# Patient Record
Sex: Male | Born: 1945 | Race: White | Hispanic: No | Marital: Single | Smoking: Former smoker
Health system: Southern US, Community
[De-identification: ages and names within clinical notes are randomized; demographics above are authoritative.]

## PROBLEM LIST (undated history)

## (undated) DIAGNOSIS — E782 Mixed hyperlipidemia: Secondary | ICD-10-CM

## (undated) DIAGNOSIS — I4891 Unspecified atrial fibrillation: Secondary | ICD-10-CM

## (undated) DIAGNOSIS — E669 Obesity, unspecified: Secondary | ICD-10-CM

## (undated) DIAGNOSIS — N400 Enlarged prostate without lower urinary tract symptoms: Secondary | ICD-10-CM

## (undated) DIAGNOSIS — M1990 Unspecified osteoarthritis, unspecified site: Secondary | ICD-10-CM

## (undated) DIAGNOSIS — I712 Thoracic aortic aneurysm, without rupture, unspecified: Secondary | ICD-10-CM

## (undated) DIAGNOSIS — I10 Essential (primary) hypertension: Secondary | ICD-10-CM

## (undated) DIAGNOSIS — E1169 Type 2 diabetes mellitus with other specified complication: Secondary | ICD-10-CM

## (undated) DIAGNOSIS — G4700 Insomnia, unspecified: Secondary | ICD-10-CM

## (undated) HISTORY — PX: TONSILLECTOMY: SUR1361

## (undated) HISTORY — DX: Essential (primary) hypertension: I10

## (undated) HISTORY — DX: Type 2 diabetes mellitus with other specified complication: E11.69

## (undated) HISTORY — DX: Insomnia, unspecified: G47.00

## (undated) HISTORY — DX: Benign prostatic hyperplasia without lower urinary tract symptoms: N40.0

## (undated) HISTORY — DX: Obesity, unspecified: E66.9

## (undated) HISTORY — DX: Mixed hyperlipidemia: E78.2

## (undated) HISTORY — PX: OTHER SURGICAL HISTORY: SHX169

## (undated) HISTORY — PX: CYST EXCISION: SHX5701

## (undated) HISTORY — DX: Unspecified osteoarthritis, unspecified site: M19.90

---

## 1998-06-15 HISTORY — PX: KNEE SURGERY: SHX244

## 1999-09-24 ENCOUNTER — Encounter: Payer: Self-pay | Admitting: Emergency Medicine

## 1999-09-24 ENCOUNTER — Inpatient Hospital Stay (HOSPITAL_COMMUNITY): Admission: EM | Admit: 1999-09-24 | Discharge: 1999-09-25 | Payer: Self-pay | Admitting: Emergency Medicine

## 2009-05-28 ENCOUNTER — Ambulatory Visit: Payer: Self-pay | Admitting: Internal Medicine

## 2009-05-28 DIAGNOSIS — R030 Elevated blood-pressure reading, without diagnosis of hypertension: Secondary | ICD-10-CM | POA: Insufficient documentation

## 2009-05-28 DIAGNOSIS — M1990 Unspecified osteoarthritis, unspecified site: Secondary | ICD-10-CM | POA: Insufficient documentation

## 2009-05-28 DIAGNOSIS — G4730 Sleep apnea, unspecified: Secondary | ICD-10-CM

## 2009-05-28 DIAGNOSIS — N529 Male erectile dysfunction, unspecified: Secondary | ICD-10-CM | POA: Insufficient documentation

## 2009-05-28 DIAGNOSIS — G4700 Insomnia, unspecified: Secondary | ICD-10-CM | POA: Insufficient documentation

## 2009-05-28 DIAGNOSIS — D126 Benign neoplasm of colon, unspecified: Secondary | ICD-10-CM | POA: Insufficient documentation

## 2009-05-28 LAB — CONVERTED CEMR LAB
ALT: 41 units/L (ref 0–53)
AST: 26 units/L (ref 0–37)
Albumin: 4.3 g/dL (ref 3.5–5.2)
Alkaline Phosphatase: 85 units/L (ref 39–117)
BUN: 19 mg/dL (ref 6–23)
Basophils Absolute: 0.1 10*3/uL (ref 0.0–0.1)
Basophils Relative: 0.9 % (ref 0.0–3.0)
Bilirubin, Direct: 0.1 mg/dL (ref 0.0–0.3)
CO2: 28 meq/L (ref 19–32)
Calcium: 10 mg/dL (ref 8.4–10.5)
Chloride: 106 meq/L (ref 96–112)
Cholesterol: 200 mg/dL (ref 0–200)
Creatinine, Ser: 0.9 mg/dL (ref 0.4–1.5)
Eosinophils Absolute: 0.7 10*3/uL (ref 0.0–0.7)
Eosinophils Relative: 9.1 % — ABNORMAL HIGH (ref 0.0–5.0)
GFR calc non Af Amer: 90.89 mL/min (ref >60)
Glucose, Bld: 112 mg/dL — ABNORMAL HIGH (ref 70–99)
HCT: 44 % (ref 39.0–52.0)
HDL: 41.2 mg/dL (ref >39.00)
Hemoglobin: 15 g/dL (ref 13.0–17.0)
LDL Cholesterol: 124 mg/dL — ABNORMAL HIGH (ref 0–99)
Lymphocytes Relative: 29.3 % (ref 12.0–46.0)
Lymphs Abs: 2.1 10*3/uL (ref 0.7–4.0)
MCHC: 34 g/dL (ref 30.0–36.0)
MCV: 90.2 fL (ref 78.0–100.0)
Monocytes Absolute: 0.5 10*3/uL (ref 0.1–1.0)
Monocytes Relative: 6.4 % (ref 3.0–12.0)
Neutro Abs: 3.9 10*3/uL (ref 1.4–7.7)
Neutrophils Relative %: 54.3 % (ref 43.0–77.0)
PSA: 5.64 ng/mL — ABNORMAL HIGH (ref 0.10–4.00)
Platelets: 166 10*3/uL (ref 150.0–400.0)
Potassium: 4.3 meq/L (ref 3.5–5.1)
RBC: 4.88 M/uL (ref 4.22–5.81)
RDW: 12 % (ref 11.5–14.6)
Sodium: 141 meq/L (ref 135–145)
TSH: 2.65 microintl units/mL (ref 0.35–5.50)
Total Bilirubin: 0.9 mg/dL (ref 0.3–1.2)
Total CHOL/HDL Ratio: 5
Total Protein: 7.3 g/dL (ref 6.0–8.3)
Triglycerides: 176 mg/dL — ABNORMAL HIGH (ref 0.0–149.0)
VLDL: 35.2 mg/dL (ref 0.0–40.0)
WBC: 7.3 10*3/uL (ref 4.5–10.5)

## 2009-05-30 ENCOUNTER — Encounter: Payer: Self-pay | Admitting: Internal Medicine

## 2009-06-17 ENCOUNTER — Encounter: Payer: Self-pay | Admitting: Internal Medicine

## 2009-06-24 ENCOUNTER — Ambulatory Visit: Payer: Self-pay | Admitting: Internal Medicine

## 2009-10-01 ENCOUNTER — Telehealth: Payer: Self-pay | Admitting: Internal Medicine

## 2009-12-23 ENCOUNTER — Telehealth: Payer: Self-pay | Admitting: Internal Medicine

## 2010-07-15 NOTE — Miscellaneous (Signed)
Summary: [facility] on [facility]  [facility] back/[facility]   Imported By: Sherian Rein 06/26/2009 10:37:50  _____________________________________________________________________  External Attachment:    Type:   Image     Comment:   External Document

## 2010-07-15 NOTE — Assessment & Plan Note (Signed)
Summary: NEW / [facility] / REQUESTING CPX IF POSSIBLE/CD   Vital Signs:  Patient profile:   65 year old male Height:      76 inches Weight:      288 pounds BMI:     35.18 O2 Sat:      96 % on Room air Temp:     97.7 degrees F oral Pulse rate:   72 / minute BP sitting:   148 / 102  (left arm) Cuff size:   large  Vitals Entered By: Bill Salinas CMA (May 28, 2009 10:41 AM)  O2 Flow:  Room air CC: new pt her to est care wih primary doc/ pt declined flu shot and states he has never has a shingles vaccine. Pt is due for a tetanus and doesn't know if he has ever has a colon or if it was a flex sigmoid/ ab   Primary Care Provider:  Illene Regulus  CC:  new pt her to est care wih primary doc/ pt declined flu shot and states he has never has a shingles vaccine. Pt is due for a tetanus and doesn't know if he has ever has a colon or if it was a flex sigmoid/ ab.  History of Present Illness: Patient  presents to establish for on-going continuity care.   He has a history of foot and ankle injuries and he has chronic pain/discomfort right foot and ankle. He will have peripheral edema with long nights of work which is helped with compression stockings. He takes 4 aleve and gets some relief.  Blood pressure is a problem when his weight is up -does well at 275, but when up his BP follows.  Erectile dysfunction. Difficulty with maintaining an erection to complete intercourse.  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 He has tried [place], [place] but they don't work in that he will get a good night's sleep but then has problems with succeeding nights. He has had good results with halcion.  Preventive Screening-Counseling & Management  Alcohol-Tobacco     Alcohol drinks/day: 1     Alcohol type: spirits     Alcohol Counseling: not indicated; use of alcohol is not excessive or problematic     Smoking Status: quit     Smoking Cessation Counseling: no     Year Quit: feb  '96  Caffeine-Diet-Exercise     Caffeine use/day: none     Diet Comments: needs work     Does Patient Exercise: yes     Type of exercise:  walking the dog     Exercise (avg: min/session): <30     Times/week: 5  Current Medications (verified): 1)  None  Allergies (verified): No Known Drug Allergies  Past History:  Past Medical History: ERECTILE DYSFUNCTION, ORGANIC (ICD-607.84) SLEEP DISORDER, CHRONIC (ICD-780.50) COLONIC POLYPS (ICD-211.3) DEGENERATIVE JOINT DISEASE (ICD-715.90) OVERWEIGHT (ICD-278.02)  Past Surgical History: Tonsilectomy- 1955 Right Knee arthroscopy for torn miniscus '00  Pilonidal cystectomy '72 fracture of right arm remote Torn ligament and cartilage right ankle in the past  Family History: father- deceased @ 61: brain aneurysm, peripheral neuropathy, CAD mother- deceased @ 56: old age Neg- colon or prostate cancer; DM; MI   Social History: Deniece Ree - BA Science and Arts married '72- 3 years/divorced; married '89- 3 years/divorced; married '03 - 3 years/divorced. No children Work - Leisure centre manager.Smoking Status:  quit Caffeine use/day:  none Does Patient Exercise:  yes  Review of Systems  The patient complains of weight gain, decreased hearing, and peripheral edema.  The patient denies anorexia, fever, vision loss, hoarseness, chest pain, syncope, dyspnea on exertion, prolonged cough, headaches, abdominal pain, severe indigestion/heartburn, hematuria, muscle weakness, difficulty walking, unusual weight change, enlarged lymph nodes, angioedema, and testicular masses.         scaly lesion on the left distal lower extremity.  Physical Exam  General:  WNWD overweight white male.  Head:  normocephalic, atraumatic, and no abnormalities palpated.   Eyes:  vision grossly intact, pupils equal, pupils round, corneas and lenses clear, and no injection.   Ears:  mild cerumen right ear, left ear clear. Hearing grossly in tact Nose:  no external  deformity and nose piercing noted.   Mouth:  good dentition, no gingival abnormalities, no dental plaque, and pharynx pink and moist.   Neck:  full ROM, no thyromegaly, and no carotid bruits.   Chest Wall:  no deformities and no tenderness.   Lungs:  Normal respiratory effort, chest expands symmetrically. Lungs are clear to auscultation, no crackles or wheezes. Heart:  Normal rate and regular rhythm. S1 and S2 normal without gallop, murmur, click, rub or other extra sounds. Abdomen:  soft, non-tender, normal bowel sounds, no distention, no guarding, no abdominal hernia, and no hepatomegaly.   Prostate:  deferred Msk:  normal ROM, no joint tenderness, no joint swelling, no joint warmth, and no joint deformities.  Click in th eleft knee Pulses:  2+ radial Extremities:  1+ pedal edema right Neurologic:  No cranial nerve deficits noted. Station and gait are normal. Plantar reflexes are down-going bilaterally. DTRs are symmetrical throughout. Sensory, motor and coordinative functions appear intact. Skin:  multiple skin lesions that appear benign. On the left back at L2 is a 5mm lesion with varigated color, asymmetric edge. Cervical Nodes:  no anterior cervical adenopathy and no posterior cervical adenopathy.   Psych:  Oriented X3, memory intact for recent and remote, normally interactive, and good eye contact.     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 He has failed [place] and [place]. He has had success with Halcion  Plan - Halcion (generic) 0.25 mg at bedtime. discussed the risks of benzodiazepine hypnotics: habituation, extinction, after affects.   Problem # 3:  DEGENERATIVE JOINT DISEASE (ICD-715.90) Chronic joint pain worse at ankles.  Plan -  trial of meloxicam 15mg  once daily.  His updated medication list for this problem includes:    Meloxicam 15 Mg Tabs (Meloxicam) .Marland Kitchen... 1 by mouth once daily for arthritis  Problem # 4:  OVERWEIGHT (ICD-278.02) Patient understands the health risks associated with his weight and he is determined to loose weight with a target of 260.  Problem # 5:  ELEVATED BP READING WITHOUT DX HYPERTENSION (ICD-796.2) Patient with elevated BP at today's visit. He reports that this is weight related. He is asymptomatic with a normal exam.  Plan - recheck at next visit and if still elevated will initiated medical therapy.  BP today: 148/102  Problem # 6:  Preventive Health Care (ICD-V70.0) Except for weght and mole he has a normal exam. Routine labs are ordered. He will be a candidate for follow-up colonoscopy in a year or two.   Lab results are fine except for a  mildly elevated PSA. Recommend prostate exam at return visit.   Patient is oriented to the practice.  He will return in the near future for mole removal.   Complete Medication List: 1)  Meloxicam 15 Mg Tabs (Meloxicam) .Marland Kitchen.. 1 by mouth once daily for arthritis 2)  Triazolam 0.25 Mg Tabs (Triazolam) .Marland Kitchen.. 1 by mouth at bedtime as needed  Other Orders: TLB-Lipid Panel (80061-LIPID) TLB-Hepatic/Liver Function Pnl (80076-HEPATIC) TLB-BMP (Basic Metabolic Panel-BMET) (80048-METABOL) TLB-CBC Platelet - w/Differential (85025-CBCD) TLB-TSH (Thyroid Stimulating Hormone) (84443-TSH) TLB-PSA (Prostate Specific Antigen) (84153-PSA)   Patient: Clinton Black Note: All result statuses are Final unless otherwise noted.  Tests: (1) Lipid Panel (LIPID)   Cholesterol               200 mg/dL                   0-454     ATP III Classification            Desirable:  < 200 mg/dL                    Borderline High:  200 - 239 mg/dL               High:  > = 240 mg/dL   Triglycerides        [H]  176.0 mg/dL                 0.9-811.9     Normal:  <150 mg/dL      Borderline High:  150 - 199 mg/dL   HDL                       14.78 mg/dL                 >29.56   VLDL Cholesterol          35.2 mg/dL                  2.1-30.8   LDL Cholesterol      [H]  657 mg/dL                   8-46  CHO/HDL Ratio:  CHD Risk                             5                    Men          Women     1/2 Average Risk     3.4          3.3     Average Risk          5.0          4.4     2X Average Risk          9.6          7.1     3X Average Risk          15.0          11.0                           Tests: (2) Hepatic/Liver Function Panel (HEPATIC)   Total Bilirubin           0.9 mg/dL  0.3-1.2   Direct Bilirubin          0.1 mg/dL                   0.8-6.5   Alkaline Phosphatase      85 U/L                      39-117   AST                       26 U/L                      0-37   ALT                       41 U/L                      0-53   Total Protein             7.3 g/dL                    7.8-4.6   Albumin                   4.3 g/dL                    9.6-2.9  Tests: (3) BMP (METABOL)   Sodium                    141 mEq/L                   135-145   Potassium                 4.3 mEq/L                   3.5-5.1   Chloride                  106 mEq/L                   96-112   Carbon Dioxide            28 mEq/L                    19-32   Glucose              [H]  112 mg/dL                   52-84   BUN                       19 mg/dL                    1-32   Creatinine                0.9 mg/dL                   4.4-0.1   Calcium                   10.0 mg/dL                  0.2-72.5   GFR  90.89 mL/min                >60  Tests: (4) CBC Platelet w/Diff (CBCD)   White Cell Count          7.3 K/uL                    4.5-10.5   Red Cell Count            4.88 Mil/uL                 4.22-5.81   Hemoglobin                15.0 g/dL                   46.9-62.9   Hematocrit                44.0 %                      39.0-52.0    MCV                       90.2 fl                     78.0-100.0   MCHC                      34.0 g/dL                   52.8-41.3   RDW                       12.0 %                      11.5-14.6   Platelet Count            166.0 K/uL                  150.0-400.0   Neutrophil %              54.3 %                      43.0-77.0   Lymphocyte %              29.3 %                      12.0-46.0   Monocyte %                6.4 %                       3.0-12.0   Eosinophils%         [H]  9.1 %                       0.0-5.0   Basophils %               0.9 %                       0.0-3.0   Neutrophill Absolute      3.9 K/uL                    1.4-7.7   Lymphocyte Absolute  2.1 K/uL                    0.7-4.0   Monocyte Absolute         0.5 K/uL                    0.1-1.0  Eosinophils, Absolute                             0.7 K/uL                    0.0-0.7   Basophils Absolute        0.1 K/uL                    0.0-0.1  Tests: (5) TSH (TSH)   FastTSH                   2.65 uIU/mL                 0.35-5.50  Tests: (6) Prostate Specific Antigen (PSA)   PSA-Hyb              [H]  5.64 ng/mL                  0.10-4.00Prescriptions: TRIAZOLAM 0.25 MG TABS (TRIAZOLAM) 1 by mouth at bedtime as needed  #30 x 2   Entered and Authorized by:   Jacques Navy MD   Signed by:   Jacques Navy MD on 05/28/2009   Method used:   Print then Give to Patient   RxID:   [id number] MELOXICAM 15 MG TABS (MELOXICAM) 1 by mouth once daily for arthritis  #30 x 0   Entered and Authorized by:   Jacques Navy MD   Signed by:   Jacques Navy MD on 05/28/2009   Method used:   Print then Give to Patient   RxID:   [id number]

## 2010-07-15 NOTE — Progress Notes (Signed)
Summary: Cialis  Phone Note Call from Patient   Summary of Call: Patient is requesting rx for cialis 20mg  to go to [facility].  Initial call taken by: Lamar Sprinkles, CMA,  October 01, 2009 11:59 AM  Follow-up for Phone Call        reveiwed chart - no contra-indications. OK for cilias 20mg  # 6 1 by mouth as needed, refill x 6 Follow-up by: Jacques Navy MD,  October 01, 2009 1:28 PM    New/Updated Medications: CIALIS 20 MG TABS (TADALAFIL) 1 tab as needed Prescriptions: CIALIS 20 MG TABS (TADALAFIL) 1 tab as needed  #6 x 1   Entered by:   Ami Bullins CMA   Authorized by:   Jacques Navy MD   Signed by:   Bill Salinas CMA on 10/01/2009   Method used:   Electronically to        [facility]  [phone]* (retail)       [address]       [place], [place]  [address]       Ph: [phone] or [phone]       Fax: [phone]   RxID:   [id number] CIALIS 20 MG TABS (TADALAFIL) 1 tab as needed  #6 x 1   Entered by:   Bill Salinas CMA   Authorized by:   Jacques Navy MD   Signed by:   Bill Salinas CMA on 10/01/2009   Method used:   Print then Give to Patient   RxID:   [id number]

## 2010-07-15 NOTE — Assessment & Plan Note (Signed)
Summary: SKIN BX/ NWS #   Vital Signs:  Patient profile:   65 year old male Height:      76 inches Weight:      275 pounds BMI:     33.60 O2 Sat:      94 % on Room air Temp:     97.0 degrees F oral Pulse rate:   89 / minute BP sitting:   142 / 100  (left arm) Cuff size:   large  Vitals Entered By: Ami Bullins CMA (June 24, 2009 10:50 AM)  O2 Flow:  Room air CC: pt here with complaint of diarrhea, vomitting and chest congestion with cough x 5 days with no fever/ ab   Primary Care Provider:  Illene Regulus  CC:  pt here with complaint of diarrhea and vomitting and chest congestion with cough x 5 days with no fever/ ab.  History of Present Illness: Patient presents with a 4 days illness: started with diarrhea Friday and Saturday which then stopped. He had nausea with vomiting Friday and Saturday. Felt better sunday but then after straining developed hacking cough productive of a lot of phlegm. He has had a return of appetite.   Patient returns for punch biopsy of suspicious mole on his back.  Current Medications (verified): 1)  Meloxicam 15 Mg Tabs (Meloxicam) .Marland Kitchen.. 1 By Mouth Once Daily For Arthritis 2)  Triazolam 0.25 Mg Tabs (Triazolam) .Marland Kitchen.. 1 By Mouth At Bedtime As Needed  Allergies (verified): No Known Drug Allergies  Past History:  Past Medical History: Last updated: 06-27-09 ERECTILE DYSFUNCTION, ORGANIC (ICD-607.84) SLEEP DISORDER, CHRONIC (ICD-780.50) COLONIC POLYPS (ICD-211.3) DEGENERATIVE JOINT DISEASE (ICD-715.90) OVERWEIGHT (ICD-278.02)  Past Surgical History: Last updated: 06-27-2009 Tonsilectomy- 1955 Right Knee arthroscopy for torn miniscus '00  Pilonidal cystectomy '72 fracture of right arm remote Torn ligament and cartilage right ankle in the past  Family History: Last updated: 27-Jun-2009 father- deceased @ 11: brain aneurysm, peripheral neuropathy, CAD mother- deceased @ 74: old age Neg- colon or prostate cancer; DM; MI   Social  History: Last updated: June 27, 2009 Deniece Ree - BA Science and Arts married '72- 3 years/divorced; married '89- 3 years/divorced; married '03 - 3 years/divorced. No children Work - Leisure centre manager.  Risk Factors: Alcohol Use: 1 (2009-06-27) Caffeine Use: none (2009-06-27) Diet: needs work (June 27, 2009) Exercise: yes (06-27-09)  Risk Factors: Smoking Status: quit (06-27-2009) PMH-FH-SH reviewed-no changes except otherwise noted  Review of Systems  The patient denies anorexia, fever, weight loss, weight gain, vision loss, decreased hearing, syncope, peripheral edema, headaches, hemoptysis, abdominal pain, genital sores, muscle weakness, transient blindness, depression, and enlarged lymph nodes.    Physical Exam  General:  Heavy set white male in no distress Head:  No tenderness to percussion over frontal or maxillary sinus. Ears:  R ear normal and L ear normal.   Mouth:  throat clear Lungs:  Normal respiratory effort, chest expands symmetrically. Lungs are clear to auscultation, no crackles or wheezes. Heart:  Normal rate and regular rhythm. S1 and S2 normal without gallop, murmur, click, rub or other extra sounds. Skin:  4 mm multi-colored mole mid back - suspicious in appearance. Several keratotic lesions on the back.  Cervical Nodes:  no anterior cervical adenopathy and no posterior cervical adenopathy.   Psych:  Oriented X3 and memory intact for recent and remote.     Impression & Recommendations:  Problem # 1:  VIRAL URI (ICD-465.9) No evidence of bacterial infection.  Plan - out of work until Friday  supportive care  His updated medication list for this problem includes:    Meloxicam 15 Mg Tabs (Meloxicam) .Marland Kitchen... 1 by mouth once daily for arthritis  Problem # 2:  NEOPLASM, SKIN, UNCERTAIN BEHAVIOR (ICD-238.2)  suspicious mole - removed with punch biopsy - specimen to pathology. Tolerated procedure well. Will return in 1 week to see Ami Bullin CMA for suture  removal.   Orders: Biopsy (Punch) Skin, Single Lesion (11100)  Complete Medication List: 1)  Meloxicam 15 Mg Tabs (Meloxicam) .Marland Kitchen.. 1 by mouth once daily for arthritis 2)  Triazolam 0.25 Mg Tabs (Triazolam) .Marland Kitchen.. 1 by mouth at bedtime as needed   Procedure Note  Biopsy: Indication: suspicious lesion  Procedure # 1: punch biopsy    Size (in cm): 0.4 x 0.4    Location: low back    Comment: informed consent obtained. Post-biopsy there was more bleeding than expected - controlled with additional sutures.    Instrument used: 4mm punch    Anesthesia: 2% lidocaine w/epinephrine    Closure: simple interrupted       # of superficial sutures: 3  Cleaned and prepped with: betadine Wound dressing: bandaid Instructions: RTC in 7-10 days Additional Instructions: routine wound precautions.

## 2010-07-15 NOTE — Letter (Signed)
[facility] [address] [place], [place]  [address] Phone: [phone]      May 30, 2009   [facility] [place] [place], [place] [address]  RE:  LAB RESULTS  Dear  [name],  The following is an interpretation of your most recent lab tests.  Please take note of any instructions provided or changes to medications that have resulted from your lab work.  PSA:  borderline - further testing needed PSA: 5.64  ELECTROLYTES:  Good - no changes needed  KIDNEY FUNCTION TESTS:  Good - no changes needed  LIVER FUNCTION TESTS:  Good - no changes needed  Health professionals look at cholesterol as more involved than just the total cholesterol. We consider the level of LDL (bad) cholesterol, HDL (good), cholesterol, and Triglycerides (Grease) in the blood.  1. Your LDL should be under 100, and the HDL should be over 45, if you have any vascular disease such as heart attack, angina, stroke, TIA (mini stroke), claudication (pain in the legs when you walk due to poor circulation),  Abdominal Aortic Aneurysm (AAA), diabetes or prediabetes.  2. Your LDL should be under 130 if you have any two of the following:     a. Smoke or chew tobacco,     b. High blood pressure (if you are on medication or over 140/90 without medication),     c. Male gender,    d. HDL below 40,    e. A male relative (father, brother, or son), who have had any vascular event          as described in #1. above under the age of 93, or a male relative (mother,       sister, or daughter) who had an event as described above under age 42. (An HDL over 60 will subtract one risk factor from the total, so if you have two items in # 2 above, but an HDL over 60, you then fall into category # 3 below).  3. Your LDL should be under 160 if you have any one of the above.  Triglycerides should be under 200 with the ideal being under 150.  For diabetes or pre-diabetes, the ideal HgbA1C should be under  6.0%.  If you fall into any of the above categories, you should make a follow up appointment to discuss this with your physician.  LIPID PANEL:  Good - no changes needed Triglyceride: 176.0   Cholesterol: 200   LDL: 124   HDL: 41.20   Chol/HDL%:  5  THYROID STUDIES:  Thyroid studies normal TSH: 2.65     DIABETIC STUDIES:  Good - no changes needed Blood Glucose: 112    CBC:  Good - no changes needed   Lab results look fine except for a mildly elevated PSA. I suggest we do a prostate exam at your next visit and discuss where we go with this mild abnormality.  Call or e-mail me if you have questions [email or web address]).   Sincerely Yours,    Jacques Navy MD  Patient: Clinton Black Note: All result statuses are Final unless otherwise noted.  Tests: (1) Lipid Panel (LIPID)   Cholesterol               200 mg/dL                   2-956     ATP III Classification            Desirable:  <  200 mg/dL                    Borderline High:  200 - 239 mg/dL               High:  > = 240 mg/dL   Triglycerides        [H]  176.0 mg/dL                 1.6-109.6     Normal:  <150 mg/dL     Borderline High:  045 - 199 mg/dL   HDL                       40.98 mg/dL                 >11.91   VLDL Cholesterol          35.2 mg/dL                  4.7-82.9   LDL Cholesterol      [H]  562 mg/dL                   1-30  CHO/HDL Ratio:  CHD Risk                             5                    Men          Women     1/2 Average Risk     3.4          3.3     Average Risk          5.0          4.4     2X Average Risk          9.6          7.1     3X Average Risk          15.0          11.0                           Tests: (2) Hepatic/Liver Function Panel (HEPATIC)   Total Bilirubin           0.9 mg/dL                   8.6-5.7   Direct Bilirubin          0.1 mg/dL                   8.4-6.9   Alkaline Phosphatase      85 U/L                      39-117   AST                       26  U/L                      0-37   ALT                       41 U/L  0-53   Total Protein             7.3 g/dL                    0.4-5.4   Albumin                   4.3 g/dL                    0.9-8.1  Tests: (3) BMP (METABOL)   Sodium                    141 mEq/L                   135-145   Potassium                 4.3 mEq/L                   3.5-5.1   Chloride                  106 mEq/L                   96-112   Carbon Dioxide            28 mEq/L                    19-32   Glucose              [H]  112 mg/dL                   19-14   BUN                       19 mg/dL                    7-82   Creatinine                0.9 mg/dL                   9.5-6.2   Calcium                   10.0 mg/dL                  1.3-08.6   GFR                       90.89 mL/min                >60  Tests: (4) CBC Platelet w/Diff (CBCD)   White Cell Count          7.3 K/uL                    4.5-10.5   Red Cell Count            4.88 Mil/uL                 4.22-5.81   Hemoglobin                15.0 g/dL                   57.8-46.9   Hematocrit                44.0 %  39.0-52.0   MCV                       90.2 fl                     78.0-100.0   MCHC                      34.0 g/dL                   82.9-56.2   RDW                       12.0 %                      11.5-14.6   Platelet Count            166.0 K/uL                  150.0-400.0   Neutrophil %              54.3 %                      43.0-77.0   Lymphocyte %              29.3 %                      12.0-46.0   Monocyte %                6.4 %                       3.0-12.0   Eosinophils%         [H]  9.1 %                       0.0-5.0   Basophils %               0.9 %                       0.0-3.0   Neutrophill Absolute      3.9 K/uL                    1.4-7.7   Lymphocyte Absolute       2.1 K/uL                    0.7-4.0   Monocyte Absolute         0.5 K/uL                    0.1-1.0  Eosinophils,  Absolute                             0.7 K/uL                    0.0-0.7   Basophils Absolute        0.1 K/uL                    0.0-0.1  Tests: (5) TSH (TSH)   FastTSH                   2.65 uIU/mL  0.35-5.50  Tests: (6) Prostate Specific Antigen (PSA)   PSA-Hyb              [H]  5.64 ng/mL                  0.10-4.00

## 2010-07-15 NOTE — Progress Notes (Signed)
  Phone Note Refill Request Message from:  Fax from Pharmacy on December 23, 2009 1:44 PM  Refills Requested: Medication #1:  TRIAZOLAM 0.25 MG TABS 1 by mouth at bedtime as needed Last ov was 06/24/2009, please Advise refill  Initial call taken by: Ami Bullins CMA,  December 23, 2009 1:45 PM  Follow-up for Phone Call        ok for refill x 5 Follow-up by: Jacques Navy MD,  December 23, 2009 1:49 PM    Prescriptions: TRIAZOLAM 0.25 MG TABS (TRIAZOLAM) 1 by mouth at bedtime as needed  #30 x 5   Entered by:   Ami Bullins CMA   Authorized by:   Jacques Navy MD   Signed by:   Bill Salinas CMA on 12/23/2009   Method used:   Telephoned to ...       [facility]  [address]  [phone]* (retail)       [address]       [place], [place]  [address]       Ph: [phone] or [phone]       Fax: [phone]   RxID:   [id number]

## 2010-08-15 ENCOUNTER — Telehealth: Payer: Self-pay | Admitting: Internal Medicine

## 2010-08-21 NOTE — Progress Notes (Signed)
Summary: RF  Phone Note Refill Request Message from:  Pharmacy  Refills Requested: Medication #1:  TRIAZOLAM 0.25 MG TABS 1 by mouth at bedtime as needed [facility]  Initial call taken by: Lamar Sprinkles, CMA,  August 15, 2010 5:21 PM    Prescriptions: TRIAZOLAM 0.25 MG TABS (TRIAZOLAM) 1 by mouth at bedtime as needed  #30 x 5   Entered and Authorized by:   Jacques Navy MD   Signed by:   Jacques Navy MD on 08/15/2010   Method used:   Telephoned to ...       [facility]  [address]  [phone]* (retail)       [address]       [place], [place]  [address]       Ph: [phone] or [phone]       Fax: [phone]   RxID:   [id number]

## 2010-10-31 NOTE — H&P (Signed)
[facility]  Patient:    Clinton Black, Clinton Black                      MRN: [id number] Adm. Date:  [id number] Attending:  Barkley Bruns CC:         Quita Skye. Artis Flock, M.D.                         History and Physical  CHIEF COMPLAINT:  "Almost passed out."  HISTORY OF PRESENT ILLNESS:  The patient is a 65 year old male who experienced sudden onset of near syncope today at 10:30 a.m. while standing out in his yard. The patient describes a dizzy feeling like he was about to pass out, generalized weakness, nausea, vomiting, and shortness of breath.  The patient laid down in he floor but did not completely lose consciousness and called to his wife.  She was able to get to him right away and observe that he appeared very pale and sweaty, and thought that his speech was a little slow and slurred.  The patient denies ny headache, blurry vision, diplopia, chest pain, palpitations, focal weakness, or  paresthesias.  He was transported by EMS to the emergency room and upon arrival his blood pressure was 108/67, pulse 58, SAO2 was 97, and CBGs were 119.  The patient continued to feel weak until about 1:30 p.m. after he had received some IV fluids, and since then he has felt back to his normal baseline state.  He denies any prior history of similar episodes of this in the past and has had no cardiac history.  PAST MEDICAL HISTORY:  SURGERIES:  The patient had a tonsillectomy when he was in second grade.  He had a pilonidal cyst removed when he was in college, and had arthroscopic knee surgery done by Dr. Annell Greening six months ago.  OTHER HOSPITALIZATIONS:  None.  OTHER MEDICAL ILLNESSES:  None.  MEDICATIONS:  He takes glucosamine, chromium, and flax seed.  ALLERGIES:  No known drug allergies.  FAMILY HISTORY:  His mother is 10 and in reasonably good health.  His father died at age 65 of a cerebral aneurysm.  He has two sisters who have breast  cancer, one is deceased, and two sisters who are alive and well.  One brother who is in good health.  SOCIAL HISTORY:  The patient is married.  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  He does not smoke but does get some secondhand smoke in his job, and he drinks very little.  REVIEW OF SYSTEMS:  No other systemic, skin, eye, ENT, respiratory, cardiovascular, GI, GU, musculoskeletal, or neurological complaints.  PHYSICAL EXAMINATION:  VITAL SIGNS:  Blood pressure 117/79, pulse 72 and regular, respirations 20, temperature 97.0.  GENERAL:  Alert and in no distress.  SKIN:  Warm and dry.  No rash.  HEENT:  Eyes:  Pupils equal, round, and reactive to light.  Full EOMs.  Fundi benign.  Sclerae nonicteric.  ENT:  TMs normal.  No intraoral lesions.  Pharynx  clear.  Mucous membranes moist.  NECK:  Supple.  No adenopathy, JVD, or bruit.  Thyroid normal.  LUNGS:  Clear to auscultation and percussion.  HEART:  Regular rhythm.  No gallop or murmur.  ABDOMEN:  Soft and nontender without organomegaly or mass.  Bowel sounds normally active.  RECTAL:  No masses.  Normal prostate.  Stool heme negative.  EXTREMITIES:  No  edema.  Pulses full.  NEUROLOGIC:  Alert and oriented x 3.  Speech was clear and appropriate.  No extremity weakness or tremor.  DTRs 2+ and symmetrical.  Babinskis downgoing. Cranial nerves intact.  LABORATORY:  CBC shows hemoglobin 12.4, white count 7300.  CMET was within normal limits.  CPK-MB was 129.  Total CPK with MB of 1.4.  Troponins were negative.  EKG was within normal limits.  Chest x-ray was normal and a cranial CT was negative.  ADMITTING IMPRESSION:  Presyncopal episode, unknown cause.  Rule out cardiac arrhythmia.  PLAN:  Observe overnight on telemetry bed.DD:  09/24/99 TD:  09/24/99 Job: [id number]

## 2010-11-25 ENCOUNTER — Other Ambulatory Visit: Payer: Self-pay | Admitting: Internal Medicine

## 2011-02-19 ENCOUNTER — Other Ambulatory Visit: Payer: Self-pay | Admitting: Internal Medicine

## 2011-02-19 NOTE — Telephone Encounter (Signed)
Please advise refill? 

## 2011-02-23 NOTE — Telephone Encounter (Signed)
Ok for refill x 5 

## 2011-05-29 ENCOUNTER — Other Ambulatory Visit: Payer: Self-pay | Admitting: Internal Medicine

## 2011-07-22 ENCOUNTER — Other Ambulatory Visit (INDEPENDENT_AMBULATORY_CARE_PROVIDER_SITE_OTHER): Payer: 59

## 2011-07-22 ENCOUNTER — Encounter: Payer: Self-pay | Admitting: Internal Medicine

## 2011-07-22 ENCOUNTER — Ambulatory Visit (INDEPENDENT_AMBULATORY_CARE_PROVIDER_SITE_OTHER): Payer: 59 | Admitting: Internal Medicine

## 2011-07-22 VITALS — BP 132/78 | HR 71 | Temp 97.2°F | Resp 16 | Ht 76.0 in | Wt 279.2 lb

## 2011-07-22 DIAGNOSIS — Z23 Encounter for immunization: Secondary | ICD-10-CM

## 2011-07-22 DIAGNOSIS — Z0000 Encounter for general adult medical examination without abnormal findings: Secondary | ICD-10-CM

## 2011-07-22 DIAGNOSIS — E663 Overweight: Secondary | ICD-10-CM

## 2011-07-22 DIAGNOSIS — R030 Elevated blood-pressure reading, without diagnosis of hypertension: Secondary | ICD-10-CM

## 2011-07-22 LAB — LIPID PANEL
Cholesterol: 181 mg/dL (ref 0–200)
HDL: 42.7 mg/dL (ref >39.00)
LDL Cholesterol: 115 mg/dL — ABNORMAL HIGH (ref 0–99)
Total CHOL/HDL Ratio: 4
Triglycerides: 115 mg/dL (ref 0.0–149.0)
VLDL: 23 mg/dL (ref 0.0–40.0)

## 2011-07-22 LAB — COMPREHENSIVE METABOLIC PANEL
ALT: 27 U/L (ref 0–53)
AST: 21 U/L (ref 0–37)
Albumin: 4.3 g/dL (ref 3.5–5.2)
Alkaline Phosphatase: 100 U/L (ref 39–117)
BUN: 21 mg/dL (ref 6–23)
CO2: 27 mEq/L (ref 19–32)
Calcium: 9.5 mg/dL (ref 8.4–10.5)
Chloride: 106 mEq/L (ref 96–112)
Creatinine, Ser: 0.7 mg/dL (ref 0.4–1.5)
GFR: 118.67 mL/min (ref >60.00)
Glucose, Bld: 99 mg/dL (ref 70–99)
Potassium: 4.3 mEq/L (ref 3.5–5.1)
Sodium: 139 mEq/L (ref 135–145)
Total Bilirubin: 0.9 mg/dL (ref 0.3–1.2)
Total Protein: 7.1 g/dL (ref 6.0–8.3)

## 2011-07-22 LAB — HEPATIC FUNCTION PANEL
ALT: 27 U/L (ref 0–53)
AST: 21 U/L (ref 0–37)
Albumin: 4.3 g/dL (ref 3.5–5.2)
Alkaline Phosphatase: 100 U/L (ref 39–117)
Bilirubin, Direct: 0.1 mg/dL (ref 0.0–0.3)
Total Bilirubin: 0.9 mg/dL (ref 0.3–1.2)
Total Protein: 7.1 g/dL (ref 6.0–8.3)

## 2011-07-22 LAB — PSA: PSA: 4.15 ng/mL — ABNORMAL HIGH (ref 0.10–4.00)

## 2011-07-22 NOTE — Assessment & Plan Note (Addendum)
Interval medical history is negative. Physical exam notable for obesity otherwise normal. Need record of colonoscopy or will need to refer for study. PSA is unchanged from previous study. Immunizaion shingles vaccine and Tdap done today.  In summary - a nice man who appears to be medically stable except for weight. He will return as needed or in 1 year.

## 2011-07-22 NOTE — Progress Notes (Signed)
Subjective:    Patient ID: Clinton Black, male    DOB: 08-22-1945, 66 y.o.   MRN: [id number]  HPI The patient is here for annual wellness examination and management of other chronic and acute problems. Feeling good with no particular new complaints, no recent severe illness, surgery or injury.   The risk factors are reflected in the social history.  The roster of all physicians providing medical care to patient - is listed in the Snapshot section of the chart.  Activities of daily living:  The patient is 100% inedpendent in all ADLs: dressing, toileting, feeding as well as independent mobility  Home safety : The patient has smoke detectors in the home. Falls - fell out of tree. He has trouble with uneven surface. House is fall-safe. They wear seatbelts. No firearms at home. There is no violence in the home.   There is no risks for hepatitis, STDs or HIV. There is no   history of blood transfusion. They have no travel history to infectious disease endemic areas of the world.  The patient has seen their dentist in the last six month. They have not seen their eye doctor in the last year. They admit to any hearing difficulty and have not had audiologic testing in the last year.  They do not  have excessive sun exposure. Discussed the need for sun protection: hats, long sleeves and use of sunscreen if there is significant sun exposure.   Diet: the importance of a healthy diet is discussed. They do have a healthy diet.  The patient has a regular exercise program: walking , 40 min duration,  5 per week.  The benefits of regular aerobic exercise were discussed.  Depression screen: there are no signs or vegative symptoms of depression- irritability, change in appetite, anhedonia, sadness/tearfullness.  Cognitive assessment: the patient manages all their financial and personal affairs and is actively engaged.  The following portions of the patient's history were reviewed and updated as  appropriate: allergies, current medications, past family history, past medical history,  past surgical history, past social history  and problem list.  Vision, hearing, body mass index were assessed and reviewed.   During the course of the visit the patient was educated and counseled about appropriate screening and preventive services including : fall prevention , diabetes screening, nutrition counseling, colorectal cancer screening, and recommended immunizations.  No past medical history on file. No past surgical history on file. Family History  Problem Relation Age of Onset  . Aneurysm Father   . Heart disease Father   . Cancer Neg Hx   . Diabetes Neg Hx    History   Social History  . Marital Status: Single    Spouse Name: N/A    Number of Children: N/A  . Years of Education: N/A   Occupational History  . Not on file.   Social History Main Topics  . Smoking status: Former Smoker    Quit date: 06/15/1994  . Smokeless tobacco: Never Used  . Alcohol Use: Yes  . Drug Use: No  . Sexually Active: Yes -- Male partner(s)    Birth Control/ Protection: Condom     condom use most of the time but not always   Other Topics Concern  . Not on file   Social History Narrative   Deniece Ree - [employer]. married '72- 3 years/divorced; married '89- 3 years/divorced;. married '03 - 3 years/divorced. 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  Review of Systems Constitutional:  Negative for fever, chills, activity change and unexpected weight change. Can have sudden sweats during work several times a month.  HEENT:  Negative for hearing loss, ear pain, congestion, neck stiffness and postnasal drip. Negative for sore throat or swallowing problems. Negative for dental complaints.   Eyes: Negative for vision loss or change in visual acuity.  Respiratory: Negative for chest tightness and wheezing. Negative for DOE.   Cardiovascular: Negative for chest pain or palpitations. No  decreased exercise tolerance Gastrointestinal: No change in bowel habit. No bloating or gas. No reflux or indigestion Genitourinary: Negative for urgency, frequency, flank pain and difficulty urinating.  Musculoskeletal: Negative for myalgias, back pain, arthralgias and gait problem.  Neurological: Negative for dizziness, tremors, weakness and headaches.  Hematological: Negative for adenopathy.  Psychiatric/Behavioral: Negative for behavioral problems and dysphoric mood.       Objective:   Physical Exam Filed Vitals:   07/22/11 1407  BP: 132/78  Pulse: 71  Temp: 97.2 F (36.2 C)  Resp: 16  Weight: 279 lb 4 oz (126.667 kg)  Body mass index is 33.99 kg/(m^2).  Gen'l: Well nourished well developed, overweight white male in no acute distress  HEENT: Head: Normocephalic and atraumatic. Right Ear: External ear normal. EAC w/ cerumen/TM obscurred. Left Ear: External ear normal.  EAC with cerumen/TM obscurred. Nose: Nose normal. Mouth/Throat: Oropharynx is clear and moist. Dentition - native, in good repair. No buccal or palatal lesions. Posterior pharynx clear. Eyes: Conjunctivae and sclera clear. EOM intact. Pupils are equal, round, and reactive to light. Right eye exhibits no discharge. Left eye exhibits no discharge. Neck: Normal range of motion. Neck supple. No JVD present. No tracheal deviation present. No thyromegaly present.  Cardiovascular: Normal rate, regular rhythm, no gallop, no friction rub, no murmur heard.      Quiet precordium. 2+ radial and DP pulses . No carotid bruits Pulmonary/Chest: Effort normal. No respiratory distress or increased WOB, no wheezes, no rales. No chest wall deformity or CVAT. Gynecomastia. Abdominal: Soft. Bowel sounds are normal in all quadrants. He exhibits no distension, no tenderness, no rebound or guarding, No heptosplenomegaly  Genitourinary:  deferred to PSA Musculoskeletal: Normal range of motion. He exhibits no edema and no tenderness.        Small and large joints without redness, synovial thickening or deformity. Full range of motion preserved about all small, median and large joints.  Lymphadenopathy:    He has no cervical or supraclavicular adenopathy.  Neurological: He is alert and oriented to person, place, and time. CN II-XII intact. DTRs 2+ and symmetrical biceps, radial and patellar tendons. Cerebellar function normal with no tremor, rigidity, normal gait and station.  Skin: Skin is warm and dry. No rash noted. No erythema.  Psychiatric: He has a normal mood and affect. His behavior is normal. Thought content normal.   Lab Results  Component Value Date   WBC 7.3 05/28/2009   HGB 15.0 05/28/2009   HCT 44.0 05/28/2009   PLT 166.0 05/28/2009   GLUCOSE 99 07/22/2011   CHOL 181 07/22/2011   TRIG 115.0 07/22/2011   HDL 42.70 07/22/2011   LDLCALC 115* 07/22/2011   ALT 27 07/22/2011   ALT 27 07/22/2011   AST 21 07/22/2011   AST 21 07/22/2011   NA 139 07/22/2011   K 4.3 07/22/2011   CL 106 07/22/2011   CREATININE 0.7 07/22/2011   BUN 21 07/22/2011   CO2 27 07/22/2011   TSH 2.65 05/28/2009   PSA  4.15* 07/22/2011        Assessment & Plan:

## 2011-07-22 NOTE — Assessment & Plan Note (Signed)
Advised that this is the biggest threat to his health.  Plan - weight management: smart food choices, PORTION SIZE CONTROL, regular aerobic exercise - 3 times a week for 30 minutes with HR 120

## 2011-07-22 NOTE — Assessment & Plan Note (Signed)
BP Readings from Last 3 Encounters:  07/22/11 132/78  06/24/09 142/100  05/28/09 148/102    Reading today is oK  Plan - continued monitoring

## 2011-07-23 LAB — HIV ANTIBODY (ROUTINE TESTING W REFLEX): HIV: NONREACTIVE

## 2011-07-26 ENCOUNTER — Encounter: Payer: Self-pay | Admitting: Internal Medicine

## 2011-08-27 ENCOUNTER — Other Ambulatory Visit: Payer: Self-pay | Admitting: Internal Medicine

## 2011-08-28 NOTE — Telephone Encounter (Signed)
Rx faxed to pharmacy  

## 2012-02-24 ENCOUNTER — Other Ambulatory Visit: Payer: Self-pay | Admitting: Internal Medicine

## 2012-02-24 NOTE — Telephone Encounter (Signed)
PATIENT REQUEST.  REFILL ON TRIAZOLAM  0.25MG  . LAST OV 07/22/2011.

## 2012-02-25 ENCOUNTER — Other Ambulatory Visit: Payer: Self-pay | Admitting: *Deleted

## 2012-02-25 NOTE — Telephone Encounter (Signed)
Rx FAXED TO [facility] FOR TRIAZOLAM

## 2012-04-25 ENCOUNTER — Ambulatory Visit (INDEPENDENT_AMBULATORY_CARE_PROVIDER_SITE_OTHER): Payer: 59 | Admitting: *Deleted

## 2012-04-25 DIAGNOSIS — Z23 Encounter for immunization: Secondary | ICD-10-CM

## 2012-08-22 ENCOUNTER — Other Ambulatory Visit: Payer: Self-pay | Admitting: *Deleted

## 2012-08-22 ENCOUNTER — Other Ambulatory Visit: Payer: Self-pay | Admitting: Internal Medicine

## 2012-08-22 MED ORDER — TRIAZOLAM 0.25 MG PO TABS: ORAL_TABLET | ORAL | Status: DC

## 2012-08-22 NOTE — Telephone Encounter (Signed)
Ok to refill 

## 2012-08-22 NOTE — Telephone Encounter (Signed)
rx faxed to pharmacy manually  

## 2012-09-17 ENCOUNTER — Other Ambulatory Visit: Payer: Self-pay | Admitting: Internal Medicine

## 2012-09-19 NOTE — Telephone Encounter (Signed)
Triazolam called to pharmacy  

## 2012-09-26 ENCOUNTER — Ambulatory Visit (INDEPENDENT_AMBULATORY_CARE_PROVIDER_SITE_OTHER): Payer: Medicare HMO | Admitting: Internal Medicine

## 2012-09-26 ENCOUNTER — Encounter: Payer: Self-pay | Admitting: Internal Medicine

## 2012-09-26 ENCOUNTER — Ambulatory Visit (INDEPENDENT_AMBULATORY_CARE_PROVIDER_SITE_OTHER)
Admission: RE | Admit: 2012-09-26 | Discharge: 2012-09-26 | Disposition: A | Discharge status: Home / Self Care | Payer: Medicare HMO | Source: Ambulatory Visit | Attending: Internal Medicine | Admitting: Internal Medicine

## 2012-09-26 VITALS — BP 114/62 | HR 123 | Temp 98.1°F | Ht 76.0 in | Wt 273.0 lb

## 2012-09-26 DIAGNOSIS — R0602 Shortness of breath: Secondary | ICD-10-CM

## 2012-09-26 DIAGNOSIS — R05 Cough: Secondary | ICD-10-CM

## 2012-09-26 DIAGNOSIS — J209 Acute bronchitis, unspecified: Secondary | ICD-10-CM

## 2012-09-26 DIAGNOSIS — R059 Cough, unspecified: Secondary | ICD-10-CM

## 2012-09-26 IMAGING — CR DG CHEST 2V
2 series · 2 of 2 positions shown · non-contrast
Comparison: None.

CLINICAL DATA: Congestion.  Short of breath.  Fatigue.

CHEST - 2 VIEW

[view not recorded (1 of 2)]
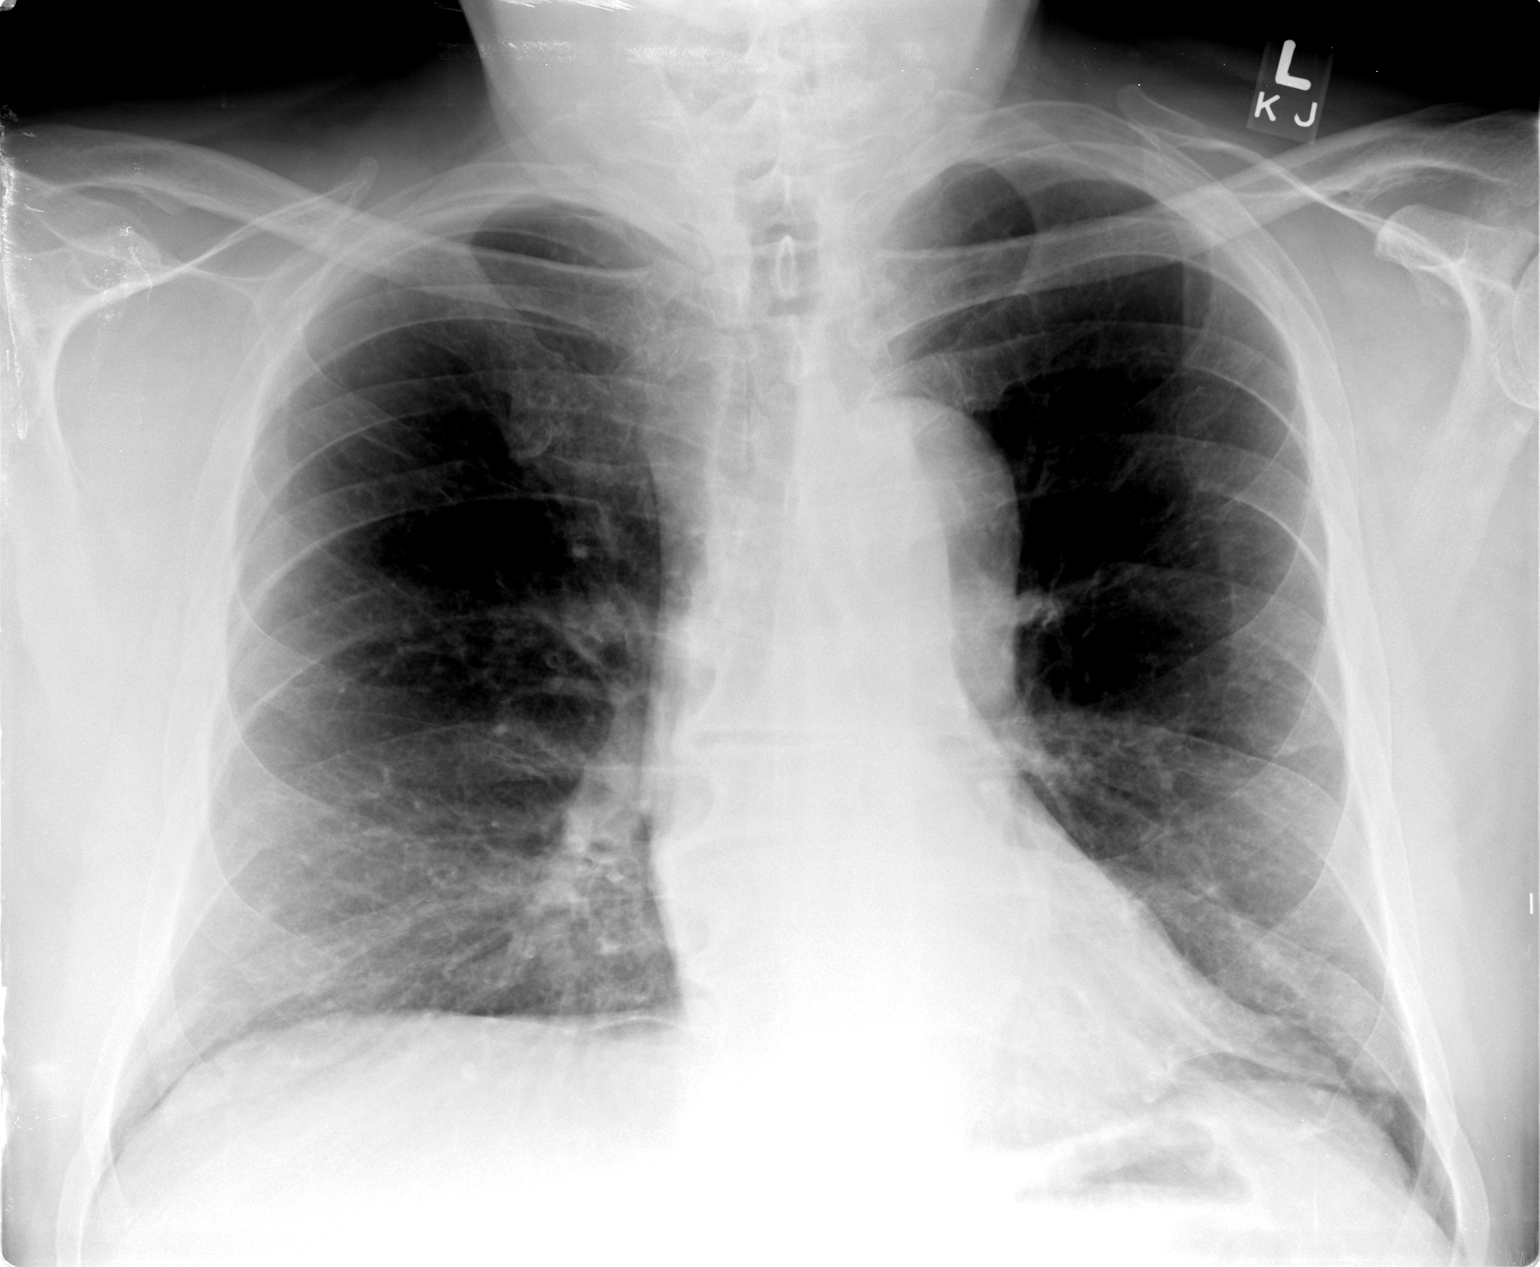

[view not recorded (2 of 2)]
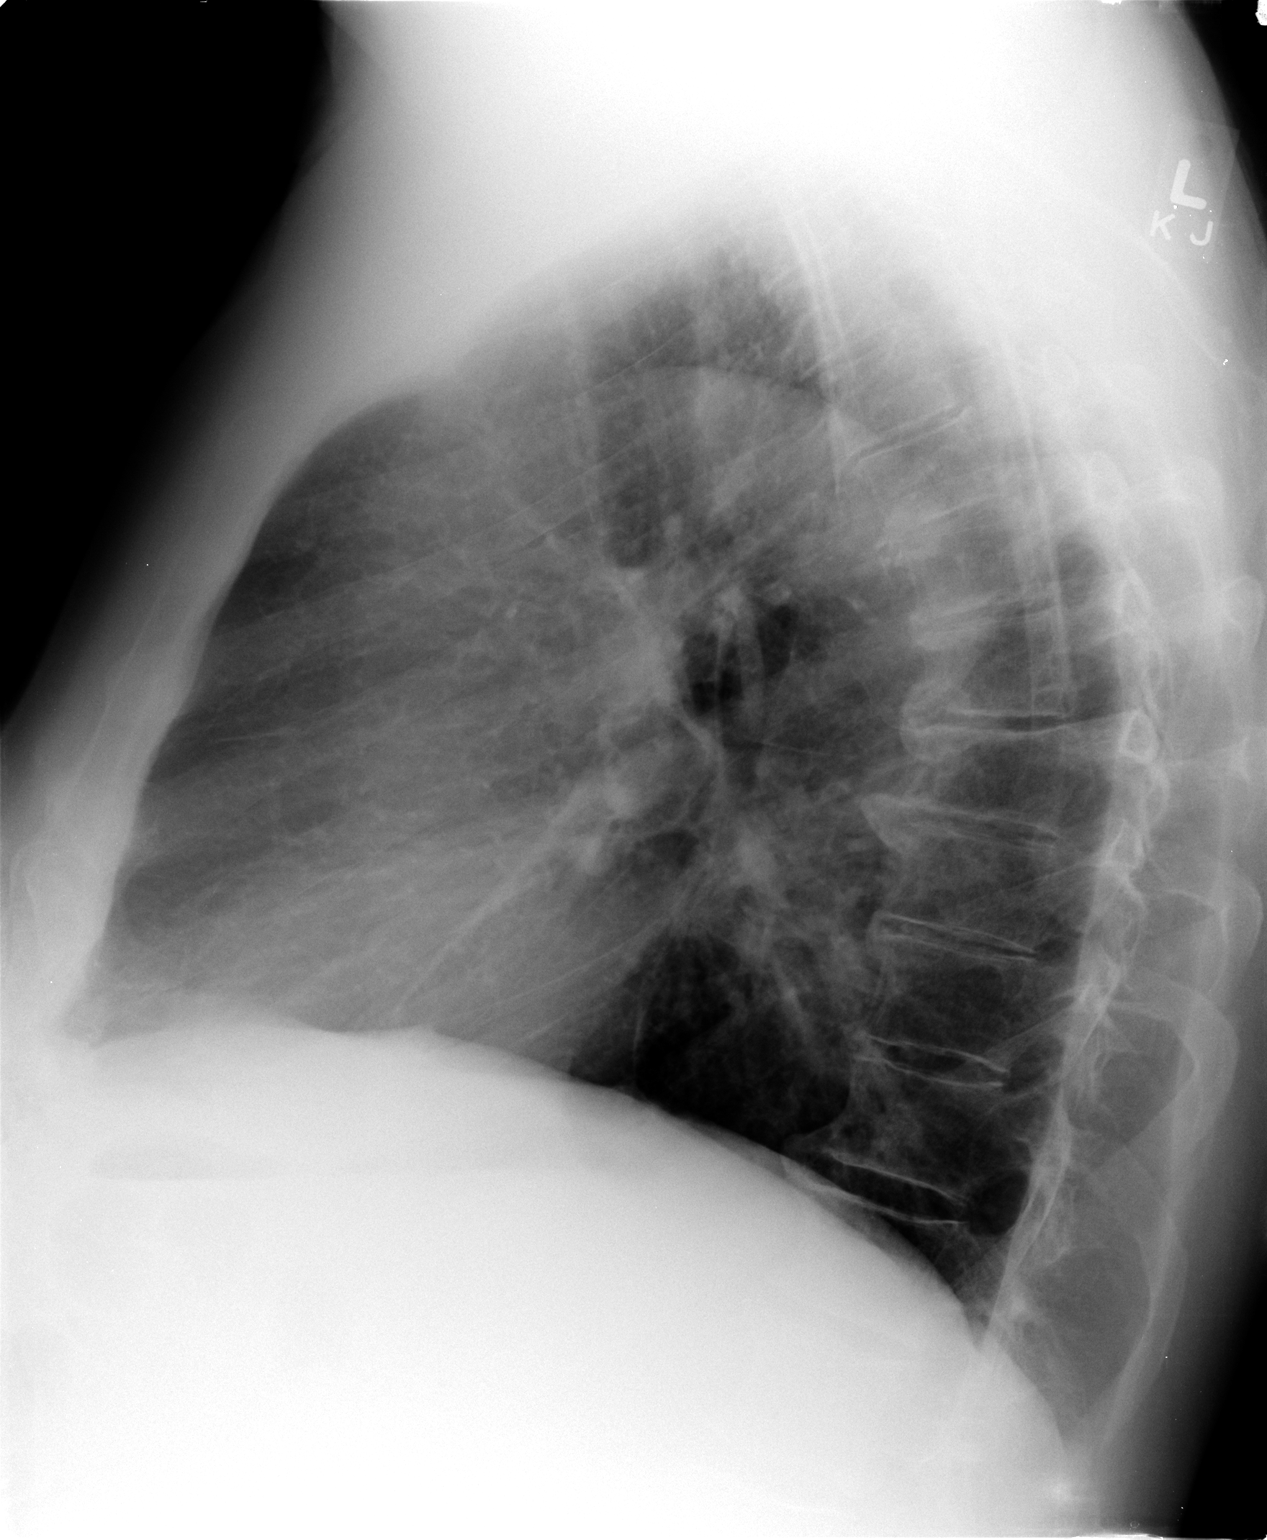

[2 of 2 positions shown; findings below may reference images not displayed]

FINDINGS: Heart size is normal.  The aorta appears unfolded and
possibly somewhat ectatic.  The pulmonary vascularity is normal.
The lungs are clear.  No effusions.  Ordinary degenerative changes
effect the spine.
IMPRESSION: Ectatic and tortuous aorta.  No active process identified.

## 2012-09-26 MED ORDER — LEVOFLOXACIN 500 MG PO TABS: 500.0000 mg | ORAL_TABLET | Freq: Every day | ORAL | Status: DC

## 2012-09-26 MED ORDER — PREDNISONE 10 MG PO TABS: ORAL_TABLET | ORAL | Status: DC

## 2012-09-26 NOTE — Patient Instructions (Signed)

## 2012-09-26 NOTE — Progress Notes (Signed)
HPI  Pt presents to the clinic today with c/o cold symptoms x 5 days. The worst part is the sore throat and cough. He does  produce a some green sputum. He denies fevers. He has tried Zycam, Mucinex, Thera flu, cough drops and nothing seems to help. The cough is worse at night. He has not had much sleep in 3 nights. He does not have a history of allergies or asthma. He does have sick contacts.  Review of Systems     History reviewed. No pertinent past medical history.  Family History  Problem Relation Age of Onset  . Aneurysm Father   . Heart disease Father   . Cancer Neg Hx   . Diabetes Neg Hx     History   Social History  . Marital Status: Single    Spouse Name: N/A    Number of Children: N/A  . Years of Education: N/A   Occupational History  . Not on file.   Social History Main Topics  . Smoking status: Former Smoker    Quit date: 06/15/1994  . Smokeless tobacco: Never Used  . Alcohol Use: Yes  . Drug Use: No  . Sexually Active: Yes -- Male partner(s)    Birth Control/ Protection: Condom     Comment: condom use most of the time but not always   Other Topics Concern  . Not on file   Social History Narrative   Deniece Ree - [employer]. married '72- 3 years/divorced; married '89- 3 years/divorced;. married '03 - 3 years/divorced. 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    No Known Allergies   Constitutional: Positive headache, fatigue and fever. Denies fever or abrupt weight changes.  HEENT:  Positive sore throat. Denies eye redness, eye pain, pressure behind the eyes, facial pain, nasal congestion, ear pain, ringing in the ears, wax buildup, runny nose or bloody nose. Respiratory: Positive cough. Denies difficulty breathing or shortness of breath.  Cardiovascular: Denies chest pain, chest tightness, palpitations or swelling in the hands or feet.   No other specific complaints in a complete review of systems (except as listed in HPI above).  Objective:   BP  114/62  Pulse 123  Temp(Src) 98.1 F (36.7 C) (Oral)  Ht 6\' 4"  (1.93 m)  Wt 273 lb (123.832 kg)  BMI 33.24 kg/m2  SpO2 96% Wt Readings from Last 3 Encounters:  09/26/12 273 lb (123.832 kg)  07/22/11 279 lb 4 oz (126.667 kg)  06/24/09 275 lb (124.739 kg)     General: Appears his stated age, well developed, well nourished in NAD. HEENT: Head: normal shape and size; Eyes: sclera white, no icterus, conjunctiva pink, PERRLA and EOMs intact; Ears: Tm's Fels and intact, normal light reflex; Nose: mucosa pink and moist, septum midline; Throat/Mouth: + PND. Teeth present, mucosa erythematous and moist, no exudate noted, no lesions or ulcerations noted.  Neck: Mild cervical lymphadenopathy. Neck supple, trachea midline. No massses, lumps or thyromegaly present.  Cardiovascular: Tachycardic. S1,S2 noted.  No murmur, rubs or gallops noted. No JVD or BLE edema. No carotid bruits noted. Pulmonary/Chest: increased effort ans scattered rhonchi throughout. No respiratory distress.      Assessment & Plan:   Acute Bronchitis, new onset with additional workup required:  Get some rest and drink plenty of water Do salt water gargles for the sore throat eRx for Levaquin x 7 days Chest xray to r/o pneumonia given SOB eRx for pred taper  RTC as needed or if symptoms persist.

## 2012-11-16 ENCOUNTER — Other Ambulatory Visit: Payer: Self-pay | Admitting: Internal Medicine

## 2012-11-16 NOTE — Telephone Encounter (Signed)
Triazolam called to pharmacy  

## 2012-11-18 ENCOUNTER — Other Ambulatory Visit: Payer: Self-pay | Admitting: Internal Medicine

## 2012-11-18 NOTE — Telephone Encounter (Signed)
Triazolam called to pharmacy  

## 2013-05-04 ENCOUNTER — Other Ambulatory Visit: Payer: Self-pay | Admitting: Internal Medicine

## 2013-05-05 NOTE — Telephone Encounter (Signed)
triazaolam called to pharmacy

## 2013-06-23 ENCOUNTER — Telehealth: Payer: Self-pay

## 2013-06-23 DIAGNOSIS — M1990 Unspecified osteoarthritis, unspecified site: Secondary | ICD-10-CM

## 2013-06-23 NOTE — Telephone Encounter (Signed)
The patient called and needs a referral to his orthopedic dr due to an insurance change   Dr.Yates ([place])   [facility] [place]

## 2013-06-24 NOTE — Telephone Encounter (Signed)
Order placed to [facility]. 

## 2013-07-01 ENCOUNTER — Other Ambulatory Visit: Payer: Self-pay | Admitting: Internal Medicine

## 2013-08-02 ENCOUNTER — Telehealth: Payer: Self-pay | Admitting: *Deleted

## 2013-08-02 NOTE — Telephone Encounter (Signed)
Patient phoned requesting refill for his halcion.  Last OV with PCP 09/26/12 and last filled 07/01/13.  Please advise.  CB# [phone]

## 2013-08-02 NOTE — Telephone Encounter (Signed)
Ok for refill for 3 months. Will need his annual exam in April '15 with Dr. Alain Marion.

## 2013-08-03 MED ORDER — TRIAZOLAM 0.25 MG PO TABS: ORAL_TABLET | ORAL | Status: DC

## 2013-08-03 NOTE — Telephone Encounter (Signed)
Phoned in halcion refills per MD order and phoned & left voicemail message at home number for patient with MD recommendations.

## 2013-08-23 ENCOUNTER — Ambulatory Visit (HOSPITAL_COMMUNITY)
Admission: RE | Admit: 2013-08-23 | Discharge: 2013-08-23 | Disposition: A | Discharge status: Home / Self Care | Payer: Medicare HMO | Source: Ambulatory Visit | Attending: Internal Medicine | Admitting: Internal Medicine

## 2013-08-23 ENCOUNTER — Encounter: Payer: Self-pay | Admitting: Internal Medicine

## 2013-08-23 ENCOUNTER — Ambulatory Visit (INDEPENDENT_AMBULATORY_CARE_PROVIDER_SITE_OTHER): Payer: Medicare HMO | Admitting: Internal Medicine

## 2013-08-23 VITALS — BP 132/80 | HR 73 | Temp 98.1°F | Resp 18 | Ht 77.0 in | Wt 291.4 lb

## 2013-08-23 DIAGNOSIS — N508 Other specified disorders of male genital organs: Secondary | ICD-10-CM

## 2013-08-23 DIAGNOSIS — N5089 Other specified disorders of the male genital organs: Secondary | ICD-10-CM

## 2013-08-23 IMAGING — US US SCROTUM
1 series · 14 of 25 positions shown · non-contrast
Comparison: none

[Series 1: us scrotum · 0.07mm/px · 14 of 26 slices shown]
[im 1/26]
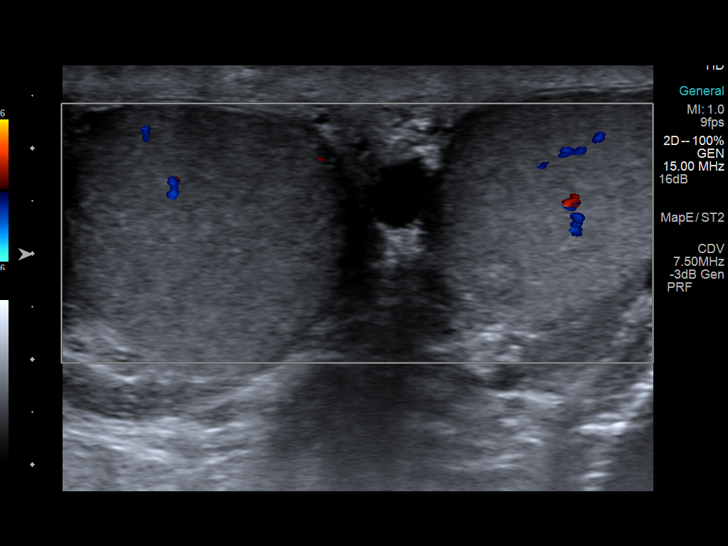
[im 3/26]
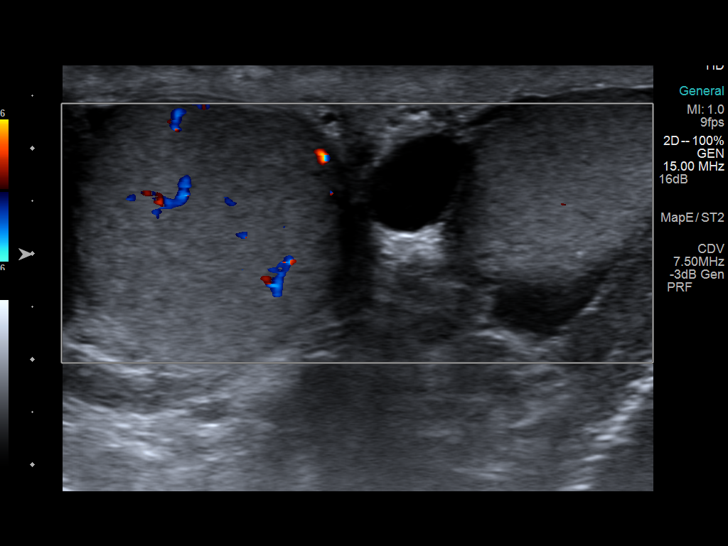
[im 5/26]
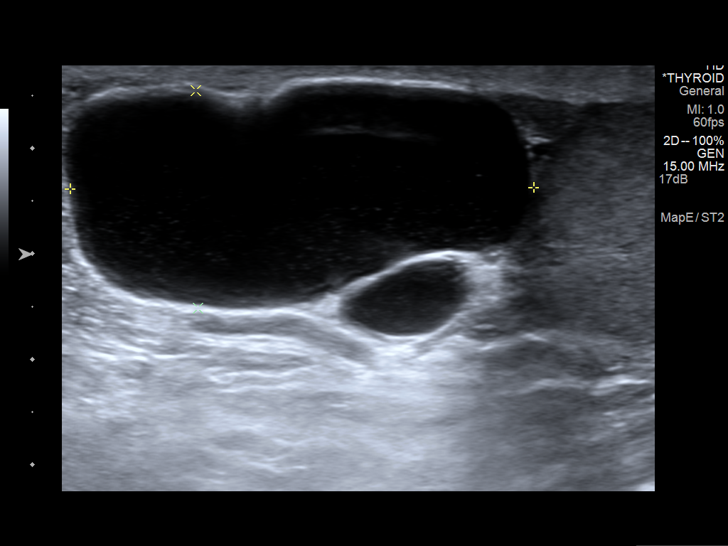
[im 7/26]
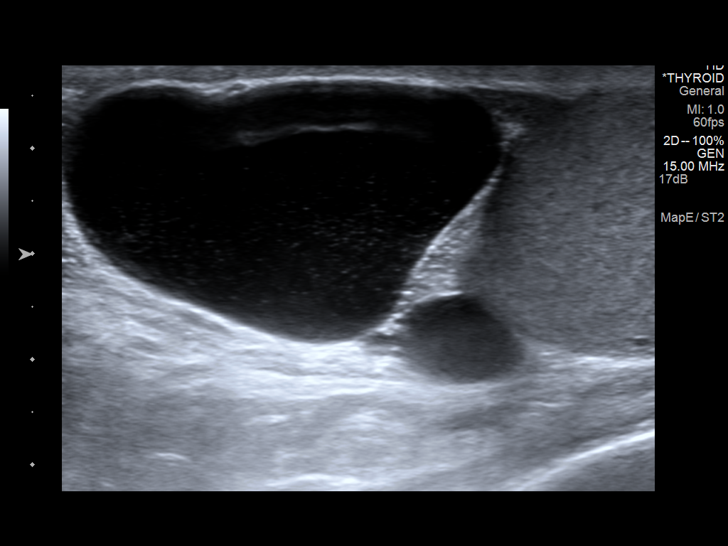
[im 9/26]
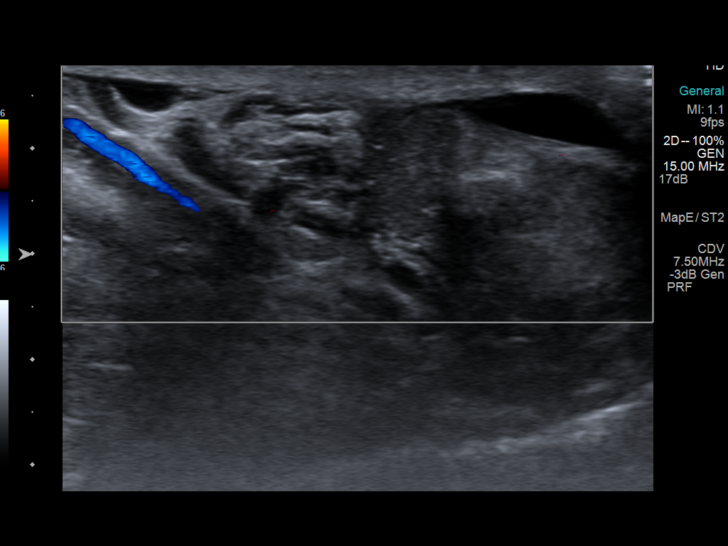
[im 10/26]
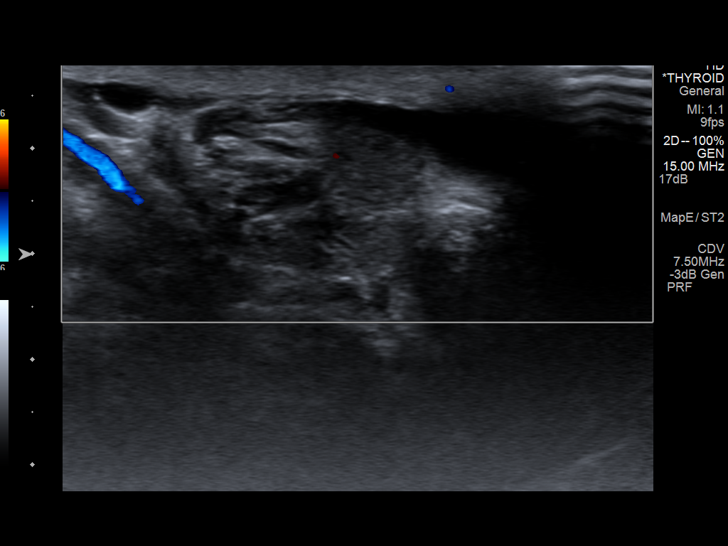
[im 12/26]
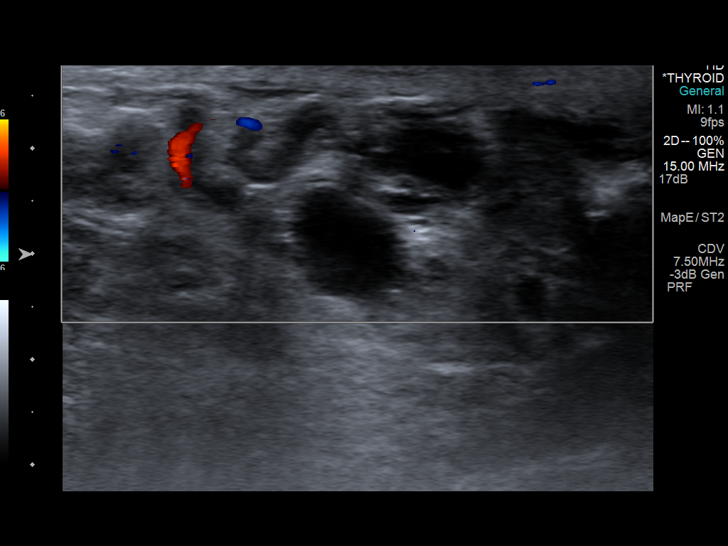
[im 14/26]
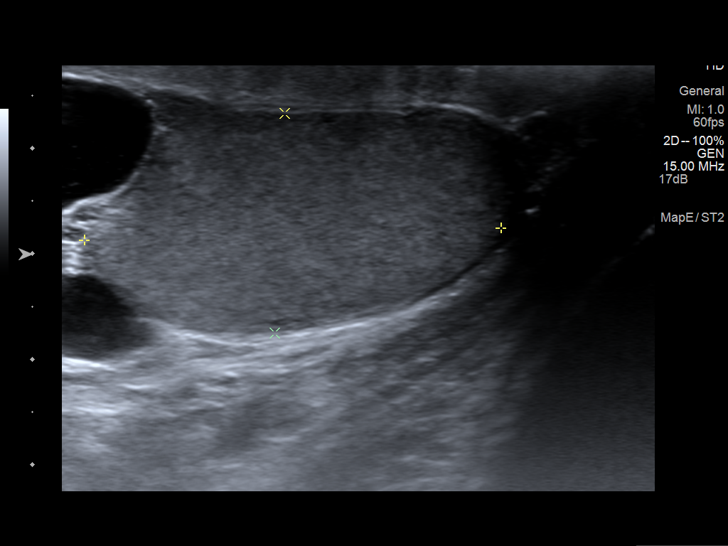
[im 16/26]
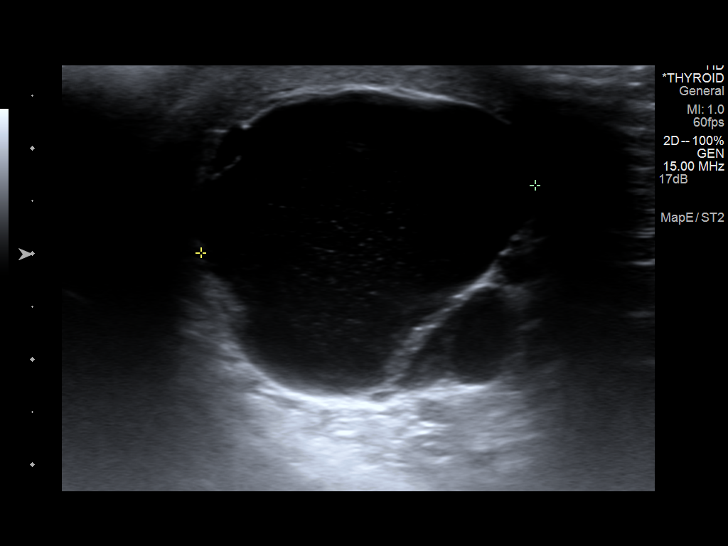
[im 17/26]
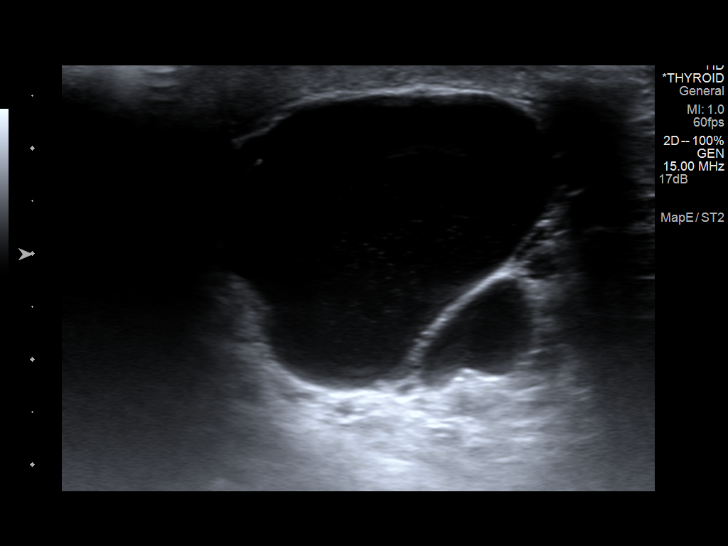
[im 19/26]
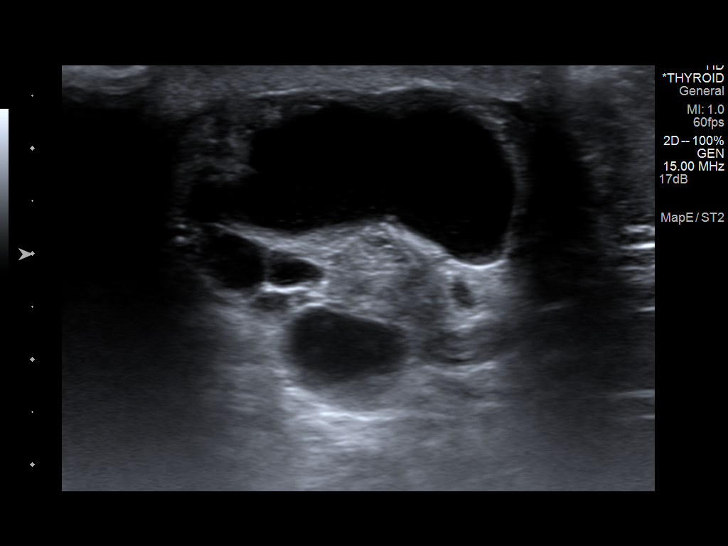
[im 21/26]
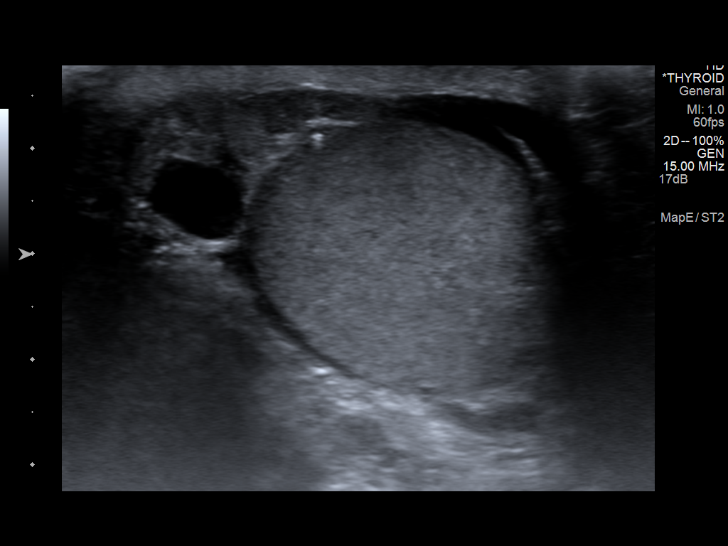
[im 23/26]
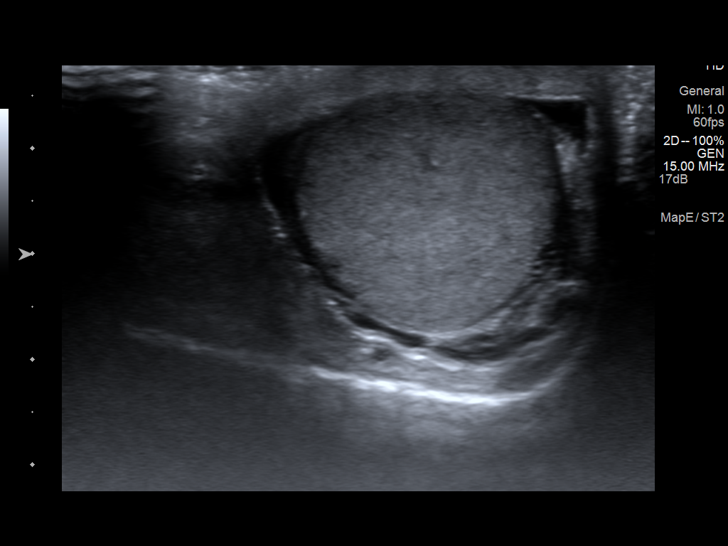
[im 26/26]
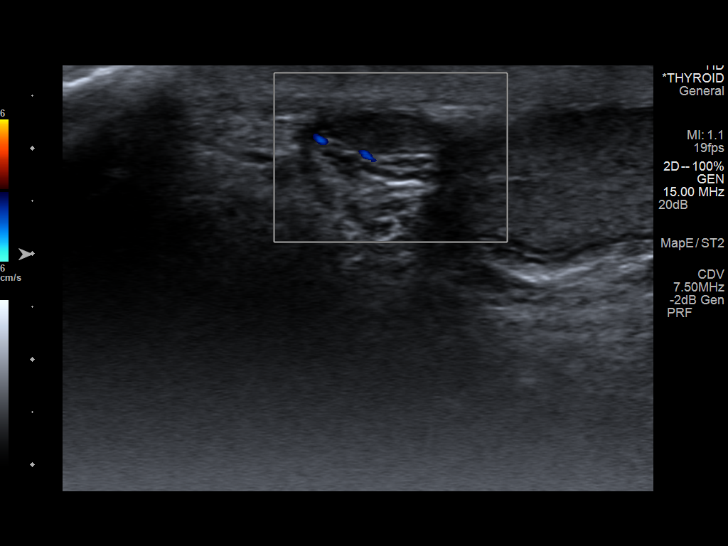

[14 of 25 positions shown; findings below may reference images not displayed]

CLINICAL DATA
Left scrotal mass.

EXAM
ULTRASOUND OF SCROTUM

TECHNIQUE
Complete ultrasound examination of the testicles, epididymis, and
other scrotal structures was performed.

COMPARISON
None.

FINDINGS
Right testicle

Measurements: 4.3 x 2.2 x 3.1 cm. No mass or microlithiasis
visualized.

Left testicle

Measurements: 4.0 x 2.1 x 2.7 cm. No mass or microlithiasis
visualized.

Right epididymis:  Normal in size and appearance.

Left epididymis: 4.4 x 2.1 x 3.2 cm complex cystic mass within the
superior aspect of the scrotum. These could represent epididymal
cysts and or spermatocele.

Hydrocele:  None visualized.

Varicocele:  None visualized.

IMPRESSION
Prominent complex cystic left intrascrotal mass. This most likely
represents a large spermatocele. Prominent epididymal cysts could
present in this fashion. Under the appropriate clinical conditions
abscess could present in this fashion . Cystic tumor would be less
likely. The testicle appears normal.

SIGNATURE

## 2013-08-23 IMAGING — US US SCROTUM
1 series · 14 of 25 positions shown · non-contrast
Comparison: none

[Series 1: us scrotum · 0.06mm/px · 14 of 36 slices shown]
[im 1/36]
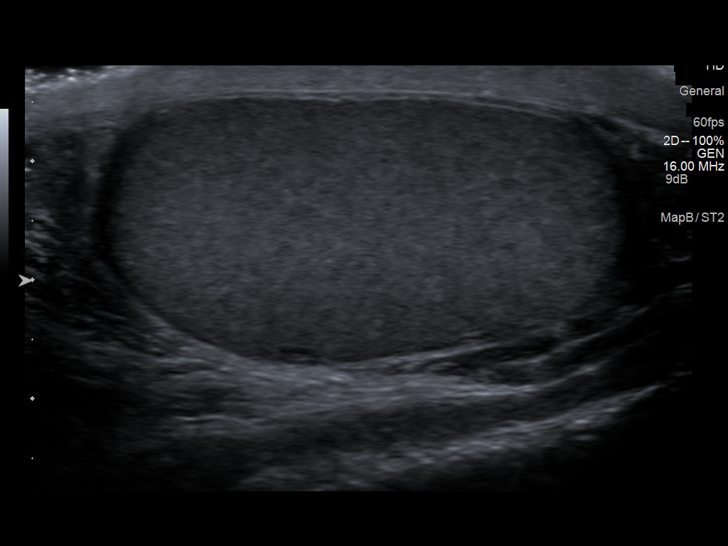
[im 3/36]
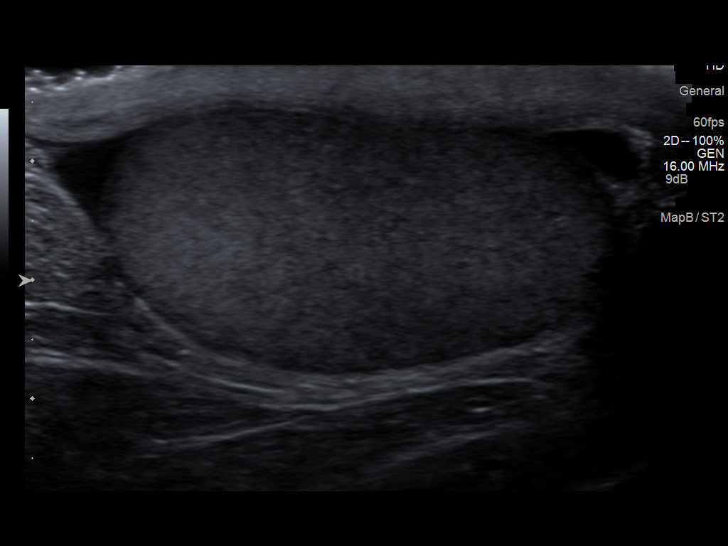
[im 6/36]
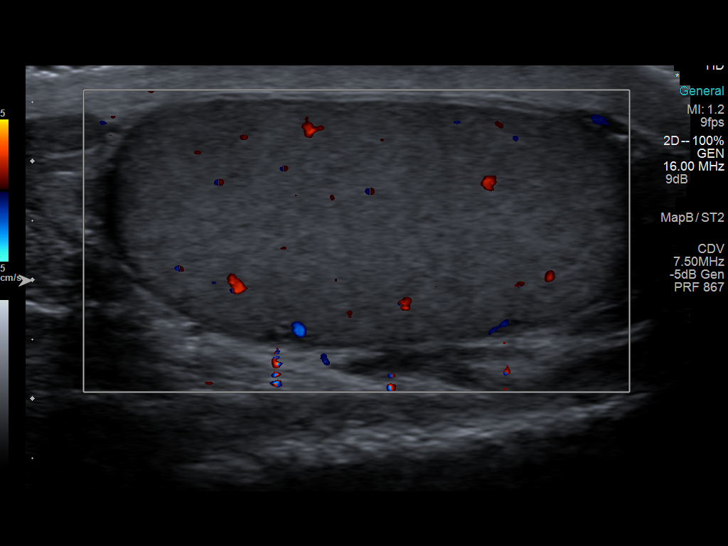
[im 9/36]
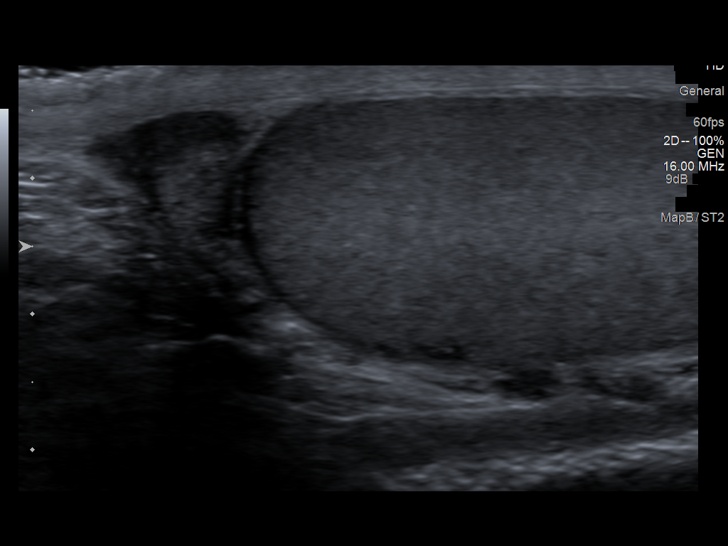
[im 12/36]
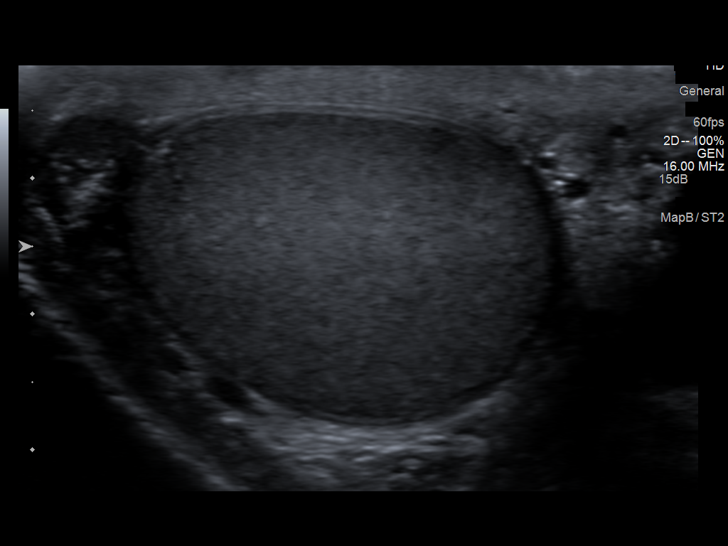
[im 14/36]
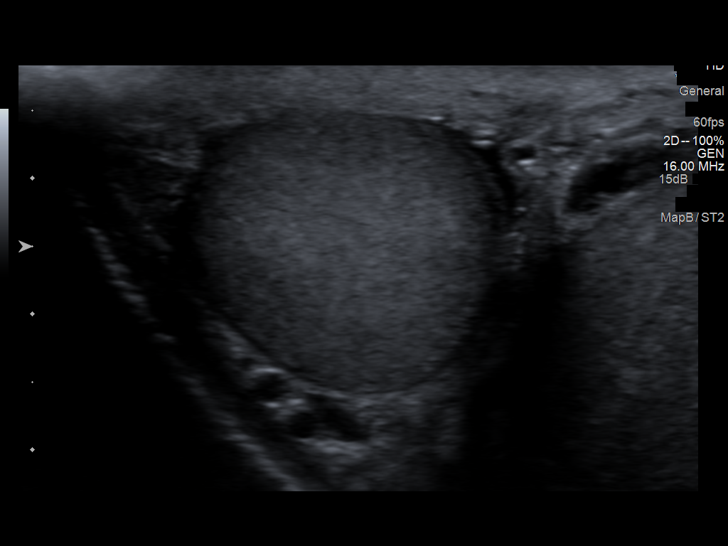
[im 17/36]
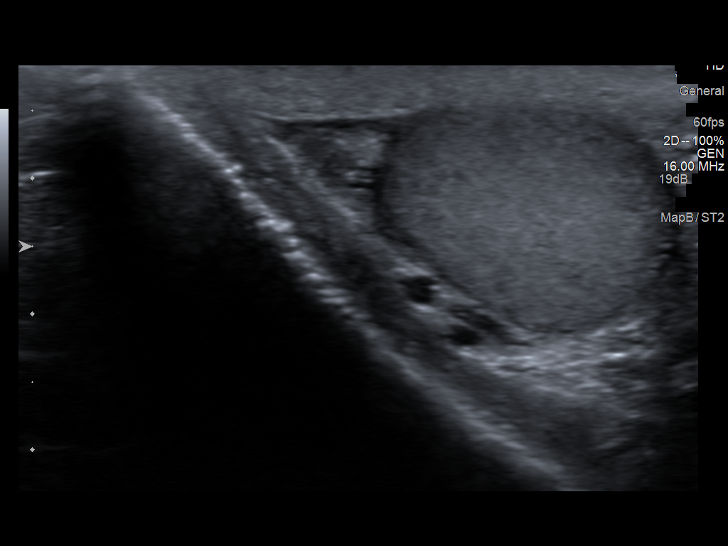
[im 19/36]
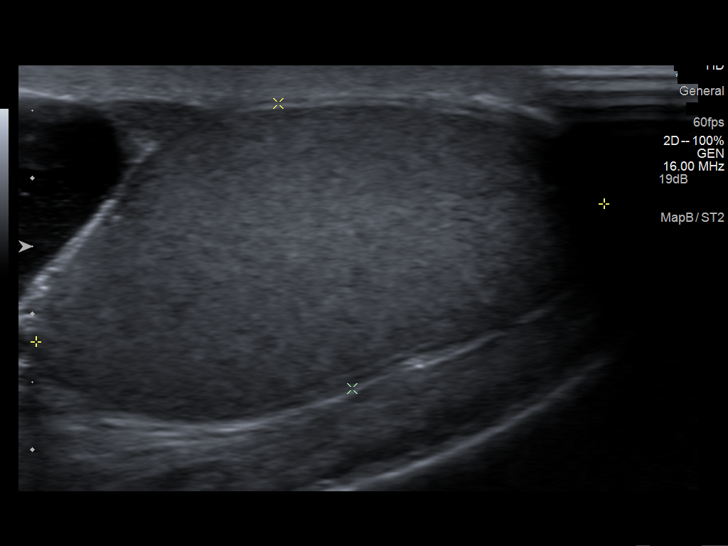
[im 22/36]
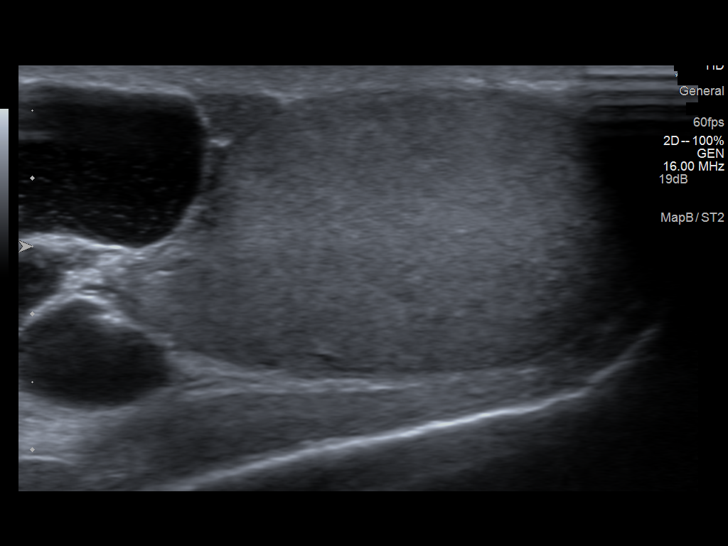
[im 24/36]
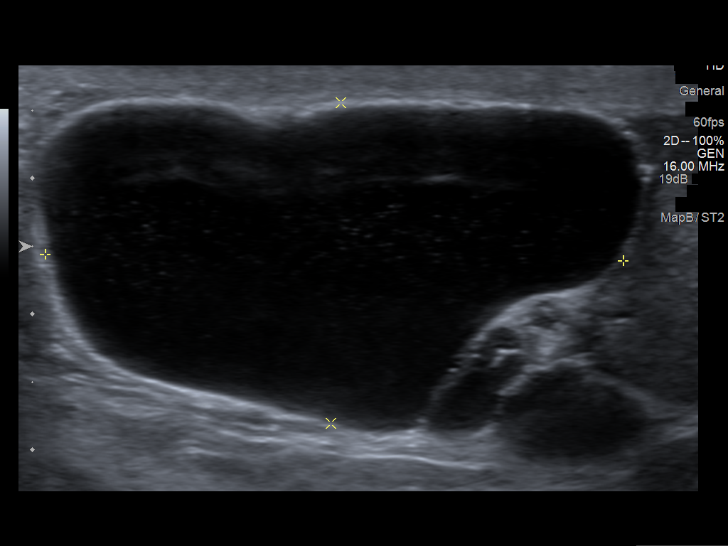
[im 27/36]
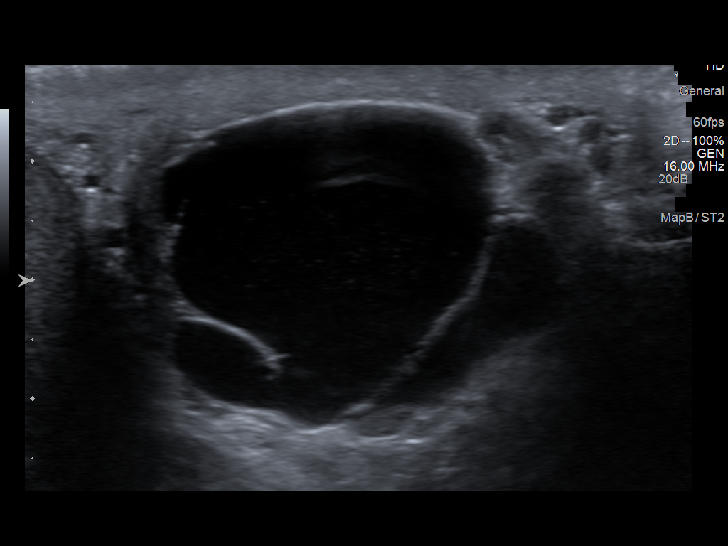
[im 30/36]
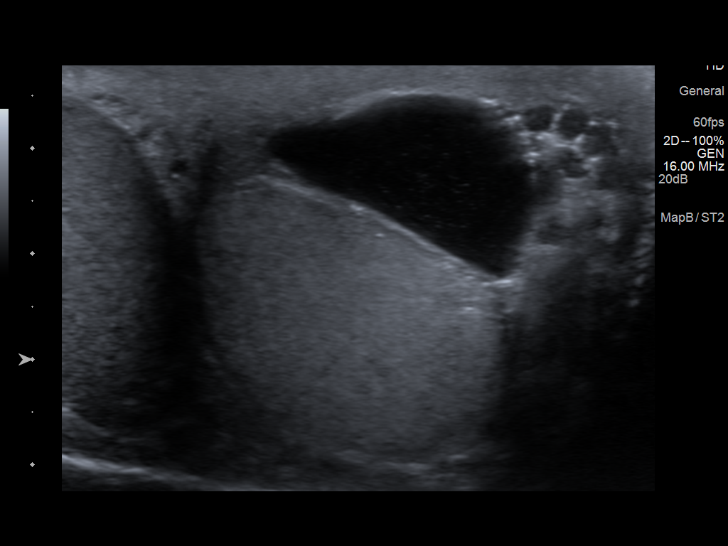
[im 33/36]
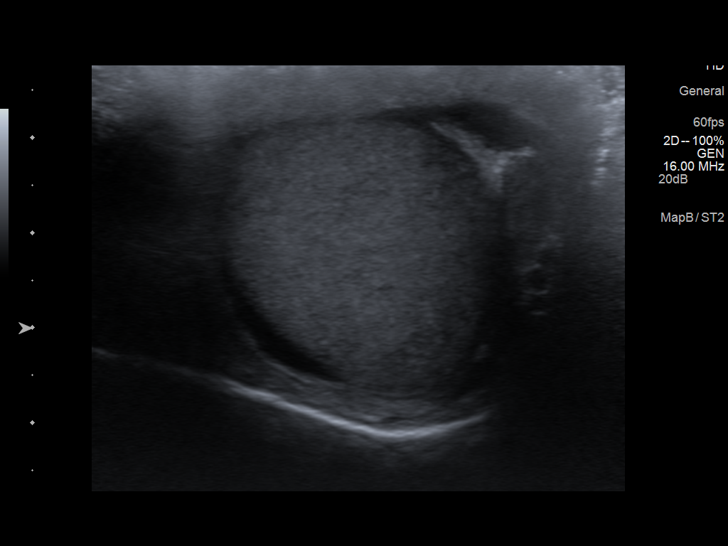
[im 36/36]
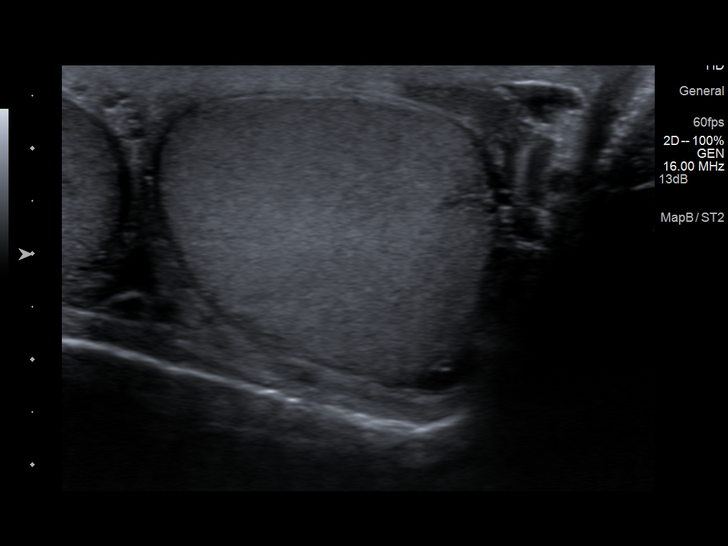

[14 of 25 positions shown; findings below may reference images not displayed]

CLINICAL DATA
Left scrotal mass.

EXAM
ULTRASOUND OF SCROTUM

TECHNIQUE
Complete ultrasound examination of the testicles, epididymis, and
other scrotal structures was performed.

COMPARISON
None.

FINDINGS
Right testicle

Measurements: 4.3 x 2.2 x 3.1 cm. No mass or microlithiasis
visualized.

Left testicle

Measurements: 4.0 x 2.1 x 2.7 cm. No mass or microlithiasis
visualized.

Right epididymis:  Normal in size and appearance.

Left epididymis: 4.4 x 2.1 x 3.2 cm complex cystic mass within the
superior aspect of the scrotum. These could represent epididymal
cysts and or spermatocele.

Hydrocele:  None visualized.

Varicocele:  None visualized.

IMPRESSION
Prominent complex cystic left intrascrotal mass. This most likely
represents a large spermatocele. Prominent epididymal cysts could
present in this fashion. Under the appropriate clinical conditions
abscess could present in this fashion . Cystic tumor would be less
likely. The testicle appears normal.

SIGNATURE

## 2013-08-23 NOTE — Patient Instructions (Signed)
scrotal mass left -  Will get ultrasound today and go from there

## 2013-08-23 NOTE — Progress Notes (Signed)
Pre visit review using our clinic review tool, if applicable. No additional management support is needed unless otherwise documented below in the visit note. 

## 2013-08-23 NOTE — Progress Notes (Signed)
   Subjective:    Patient ID: Clinton Black, male    DOB: Mar 11, 1946, 68 y.o.   MRN: [id number]  HPI Mr. Tabet presents for evaluation of a mass in the left testicle. He has noticed this over the past month. There is no pain or tenderness. He has had no prior GU problems.  History reviewed. No pertinent past medical history. History reviewed. No pertinent past surgical history. Family History  Problem Relation Age of Onset  . Aneurysm Father   . Heart disease Father   . Cancer Neg Hx   . Diabetes Neg Hx    History   Social History  . Marital Status: Single    Spouse Name: N/A    Number of Children: N/A  . Years of Education: N/A   Occupational History  . Not on file.   Social History Main Topics  . Smoking status: Former Smoker    Quit date: 06/15/1994  . Smokeless tobacco: Never Used  . Alcohol Use: Yes  . Drug Use: No  . Sexual Activity: Yes    Partners: Female    Birth Control/ Protection: Condom     Comment: condom use most of the time but not always   Other Topics Concern  . Not on file   [place] [employer]. married '72- 3 years/divorced; married '89- 3 years/divorced;. married '03 - 3 years/divorced. 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     Current Outpatient Prescriptions on File Prior to Visit  Medication Sig Dispense Refill  . aspirin 81 MG tablet Take 81 mg by mouth daily.      . Coenzyme Q10 (COQ10 MAXIMUM STRENGTH PO) Take 1 tablet by mouth daily. 250mg       . fish oil-omega-3 fatty acids 1000 MG capsule Take 2 g by mouth daily.      Marland Kitchen glucosamine-chondroitin 500-400 MG tablet Take 1 tablet by mouth daily.      Marland Kitchen levofloxacin (LEVAQUIN) 500 MG tablet Take 1 tablet (500 mg total) by mouth daily.  7 tablet  0  . meloxicam (MOBIC) 15 MG tablet TAKE ONE TABLET BY MOUTH EVERY DAY FOR ARTHRITIS  30 tablet  5  . Multiple Vitamin (MULTIVITAMIN) tablet Take 1 tablet by mouth daily.      . predniSONE (DELTASONE) 10 MG  tablet Take 3 tablets on days 1-3, take 2 tablets on days 4-6, take 1 tablet on days 7-9  18 tablet  0  . triazolam (HALCION) 0.25 MG tablet TAKE 1 TABLET AT BEDTIME AS NEEDED FOR SLEEP  30 tablet  3   No current facility-administered medications on file prior to visit.      Review of Systems System review is negative for any constitutional, cardiac, pulmonary, GI or neuro symptoms or complaints other than as described in the HPI.     Objective:   Physical Exam Filed Vitals:   08/23/13 1033  BP: 132/80  Pulse: 73  Temp: 98.1 F (36.7 C)  Resp: 18   Genitalia - normal right testicle. In the left scrotum there is a palpable testicle normal in size and texture. Above the testicle, contiguously, is a firm, nontender mass approximately 3 cm.       Assessment & Plan:  Scrotal mass, left - patient with firm mass left scrotum above the testicle  Plan For U/S scrotum now. Recommendations to follow.

## 2013-08-24 ENCOUNTER — Telehealth: Payer: Self-pay | Admitting: *Deleted

## 2013-08-24 ENCOUNTER — Encounter: Payer: Self-pay | Admitting: Internal Medicine

## 2013-08-24 NOTE — Telephone Encounter (Signed)
Did not have an active mychart account at 0500 this AM. Have just sent MyChart message. Let me know if it doesn't come through.

## 2013-08-24 NOTE — Telephone Encounter (Signed)
Patient phoned stating he had sent PCP an email, but was also calling to check ultrasound results---states they're not showing up on his mychart.  Please advise.  CB# [phone]

## 2013-08-24 NOTE — Telephone Encounter (Signed)
Phoned patient and left voicemail message with PCP response & instructions.

## 2013-10-06 ENCOUNTER — Ambulatory Visit (INDEPENDENT_AMBULATORY_CARE_PROVIDER_SITE_OTHER): Payer: Commercial Managed Care - HMO | Admitting: Internal Medicine

## 2013-10-06 ENCOUNTER — Other Ambulatory Visit (INDEPENDENT_AMBULATORY_CARE_PROVIDER_SITE_OTHER): Payer: Commercial Managed Care - HMO

## 2013-10-06 ENCOUNTER — Encounter: Payer: Self-pay | Admitting: Internal Medicine

## 2013-10-06 VITALS — BP 140/82 | HR 80 | Temp 98.6°F | Resp 16 | Ht 77.0 in | Wt 289.0 lb

## 2013-10-06 DIAGNOSIS — M1990 Unspecified osteoarthritis, unspecified site: Secondary | ICD-10-CM

## 2013-10-06 DIAGNOSIS — Z0000 Encounter for general adult medical examination without abnormal findings: Secondary | ICD-10-CM | POA: Insufficient documentation

## 2013-10-06 DIAGNOSIS — R972 Elevated prostate specific antigen [PSA]: Secondary | ICD-10-CM

## 2013-10-06 DIAGNOSIS — N508 Other specified disorders of male genital organs: Secondary | ICD-10-CM

## 2013-10-06 DIAGNOSIS — N5089 Other specified disorders of the male genital organs: Secondary | ICD-10-CM

## 2013-10-06 DIAGNOSIS — E663 Overweight: Secondary | ICD-10-CM

## 2013-10-06 DIAGNOSIS — D485 Neoplasm of uncertain behavior of skin: Secondary | ICD-10-CM

## 2013-10-06 DIAGNOSIS — N529 Male erectile dysfunction, unspecified: Secondary | ICD-10-CM

## 2013-10-06 LAB — BASIC METABOLIC PANEL
BUN: 23 mg/dL (ref 6–23)
CO2: 21 mEq/L (ref 19–32)
Calcium: 9.7 mg/dL (ref 8.4–10.5)
Chloride: 107 mEq/L (ref 96–112)
Creatinine, Ser: 0.8 mg/dL (ref 0.4–1.5)
GFR: 99.81 mL/min (ref >60.00)
Glucose, Bld: 113 mg/dL — ABNORMAL HIGH (ref 70–99)
Potassium: 4.7 mEq/L (ref 3.5–5.1)
Sodium: 138 mEq/L (ref 135–145)

## 2013-10-06 LAB — URINALYSIS
Bilirubin Urine: NEGATIVE
Hgb urine dipstick: NEGATIVE
Ketones, ur: NEGATIVE
Leukocytes, UA: NEGATIVE
Nitrite: NEGATIVE
Specific Gravity, Urine: >=1.03 — AB (ref 1.000–1.030)
Total Protein, Urine: NEGATIVE
Urine Glucose: NEGATIVE
Urobilinogen, UA: 0.2 (ref 0.0–1.0)
pH: 5.5 (ref 5.0–8.0)

## 2013-10-06 LAB — HEPATIC FUNCTION PANEL
ALT: 38 U/L (ref 0–53)
AST: 23 U/L (ref 0–37)
Albumin: 4.4 g/dL (ref 3.5–5.2)
Alkaline Phosphatase: 87 U/L (ref 39–117)
Bilirubin, Direct: 0.1 mg/dL (ref 0.0–0.3)
Total Bilirubin: 0.8 mg/dL (ref 0.3–1.2)
Total Protein: 7.2 g/dL (ref 6.0–8.3)

## 2013-10-06 LAB — CBC WITH DIFFERENTIAL/PLATELET
Basophils Absolute: 0 10*3/uL (ref 0.0–0.1)
Basophils Relative: 0.5 % (ref 0.0–3.0)
Eosinophils Absolute: 0.6 10*3/uL (ref 0.0–0.7)
Eosinophils Relative: 8.1 % — ABNORMAL HIGH (ref 0.0–5.0)
HCT: 45.1 % (ref 39.0–52.0)
Hemoglobin: 15 g/dL (ref 13.0–17.0)
Lymphocytes Relative: 30.2 % (ref 12.0–46.0)
Lymphs Abs: 2.1 10*3/uL (ref 0.7–4.0)
MCHC: 33.2 g/dL (ref 30.0–36.0)
MCV: 90.1 fl (ref 78.0–100.0)
Monocytes Absolute: 0.4 10*3/uL (ref 0.1–1.0)
Monocytes Relative: 6.3 % (ref 3.0–12.0)
Neutro Abs: 3.8 10*3/uL (ref 1.4–7.7)
Neutrophils Relative %: 54.9 % (ref 43.0–77.0)
Platelets: 155 10*3/uL (ref 150.0–400.0)
RBC: 5 Mil/uL (ref 4.22–5.81)
RDW: 13.1 % (ref 11.5–14.6)
WBC: 6.9 10*3/uL (ref 4.5–10.5)

## 2013-10-06 LAB — TESTOSTERONE: Testosterone: 205.21 ng/dL — ABNORMAL LOW (ref 350.00–890.00)

## 2013-10-06 LAB — LIPID PANEL
Cholesterol: 200 mg/dL (ref 0–200)
HDL: 38.1 mg/dL — ABNORMAL LOW (ref >39.00)
LDL Cholesterol: 114 mg/dL — ABNORMAL HIGH (ref 0–99)
Total CHOL/HDL Ratio: 5
Triglycerides: 240 mg/dL — ABNORMAL HIGH (ref 0.0–149.0)
VLDL: 48 mg/dL — ABNORMAL HIGH (ref 0.0–40.0)

## 2013-10-06 LAB — TSH: TSH: 2.17 u[IU]/mL (ref 0.35–5.50)

## 2013-10-06 MED ORDER — AVANAFIL 100 MG PO TABS: 100.0000 mg | ORAL_TABLET | Freq: Every day | ORAL | Status: DC | PRN

## 2013-10-06 MED ORDER — VITAMIN D 1000 UNITS PO TABS: 1000.0000 [IU] | ORAL_TABLET | Freq: Every day | ORAL | Status: AC

## 2013-10-06 NOTE — Assessment & Plan Note (Signed)

## 2013-10-06 NOTE — Assessment & Plan Note (Signed)
Wt Readings from Last 3 Encounters:  10/06/13 289 lb (131.09 kg)  08/23/13 291 lb 6 oz (132.167 kg)  09/26/12 273 lb (123.832 kg)

## 2013-10-06 NOTE — Assessment & Plan Note (Signed)
Continue with current prescription therapy as reflected on the Med list.  

## 2013-10-06 NOTE — Progress Notes (Signed)
Subjective:    Patient ID: Clinton Black, male    DOB: 1946/01/16, 68 y.o.   MRN: [id number]  HPI  The patient is here for a wellness exam. The patient has been doing well overall without major physical or psychological issues going on lately, except for ED and L testes enlargement C/o B feet pain C/o L arm skin growth  BP Readings from Last 3 Encounters:  10/06/13 140/82  08/23/13 132/80  09/26/12 114/62   Wt Readings from Last 3 Encounters:  10/06/13 289 lb (131.09 kg)  08/23/13 291 lb 6 oz (132.167 kg)  09/26/12 273 lb (123.832 kg)       Review of Systems  Constitutional: Negative for appetite change, fatigue and unexpected weight change.  HENT: Negative for congestion, nosebleeds, sneezing, sore throat and trouble swallowing.   Eyes: Negative for itching and visual disturbance.  Respiratory: Negative for cough.   Cardiovascular: Negative for chest pain, palpitations and leg swelling.  Gastrointestinal: Negative for nausea, diarrhea, blood in stool and abdominal distention.  Genitourinary: Negative for frequency, hematuria, decreased urine volume and testicular pain.  Musculoskeletal: Negative for back pain, gait problem, joint swelling and neck pain.  Skin: Negative for rash.  Neurological: Negative for dizziness, tremors, speech difficulty and weakness.  Psychiatric/Behavioral: Negative for sleep disturbance, dysphoric mood and agitation. The patient is not nervous/anxious.        Objective:   Physical Exam  Constitutional: He is oriented to person, place, and time. He appears well-developed. No distress.  Obese  HENT:  Head: Normocephalic and atraumatic.  Right Ear: External ear normal.  Left Ear: External ear normal.  Nose: Nose normal.  Mouth/Throat: Oropharynx is clear and moist. No oropharyngeal exudate.  Eyes: Conjunctivae and EOM are normal. Pupils are equal, round, and reactive to light. Right eye exhibits no discharge. Left eye exhibits no  discharge. No scleral icterus.  Neck: Normal range of motion. Neck supple. No JVD present. No tracheal deviation present. No thyromegaly present.  Cardiovascular: Normal rate, regular rhythm, normal heart sounds and intact distal pulses.  Exam reveals no gallop and no friction rub.   No murmur heard. Pulmonary/Chest: Effort normal and breath sounds normal. No stridor. No respiratory distress. He has no wheezes. He has no rales. He exhibits no tenderness.  Abdominal: Soft. Bowel sounds are normal. He exhibits no distension and no mass. There is no tenderness. There is no rebound and no guarding.  Genitourinary: Rectum normal, prostate normal and penis normal. Guaiac negative stool. No penile tenderness.  Musculoskeletal: Normal range of motion. He exhibits no edema and no tenderness.  Lymphadenopathy:    He has no cervical adenopathy.  Neurological: He is alert and oriented to person, place, and time. He has normal reflexes. No cranial nerve deficit. He exhibits normal muscle tone. Coordination normal.  Skin: Skin is warm and dry. No rash noted. He is not diaphoretic. No erythema. No pallor.  Psychiatric: He has a normal mood and affect. His behavior is normal. Judgment and thought content normal.    Lab Results  Component Value Date   WBC 7.3 05/28/2009   HGB 15.0 05/28/2009   HCT 44.0 05/28/2009   PLT 166.0 05/28/2009   GLUCOSE 99 07/22/2011   CHOL 181 07/22/2011   TRIG 115.0 07/22/2011   HDL 42.70 07/22/2011   LDLCALC 115* 07/22/2011   ALT 27 07/22/2011   ALT 27 07/22/2011   AST 21 07/22/2011   AST 21 07/22/2011   NA 139 07/22/2011  K 4.3 07/22/2011   CL 106 07/22/2011   CREATININE 0.7 07/22/2011   BUN 21 07/22/2011   CO2 27 07/22/2011   TSH 2.65 05/28/2009   PSA 4.15* 07/22/2011         Assessment & Plan:

## 2013-10-06 NOTE — Assessment & Plan Note (Signed)
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 Prominent epididymal cysts could  present in this fashion. Under the appropriate clinical conditions  abscess could present in this fashion . Cystic tumor would be less  likely. The testicle appears normal.  SIGNATURE  Electronically Signed  By: Marcello Moores Register  On: 08/23/2013 12:39    Urol ref Dr Karsten Ro

## 2013-10-06 NOTE — Assessment & Plan Note (Signed)
Free PSA 

## 2013-10-06 NOTE — Progress Notes (Signed)
Pre visit review using our clinic review tool, if applicable. No additional management support is needed unless otherwise documented below in the visit note. 

## 2013-10-06 NOTE — Assessment & Plan Note (Signed)
Labs

## 2013-10-13 ENCOUNTER — Telehealth: Payer: Self-pay | Admitting: Internal Medicine

## 2013-10-13 NOTE — Telephone Encounter (Signed)
[facility] referral authorization # [id number] for Dr. Philipp Deputy. Number of visits: 4, start date 10/06/13 and expires on 01/04/14.

## 2013-10-27 ENCOUNTER — Ambulatory Visit (INDEPENDENT_AMBULATORY_CARE_PROVIDER_SITE_OTHER): Payer: Commercial Managed Care - HMO | Admitting: Internal Medicine

## 2013-10-27 ENCOUNTER — Encounter: Payer: Self-pay | Admitting: Internal Medicine

## 2013-10-27 VITALS — BP 140/82 | HR 76 | Temp 97.4°F | Resp 16 | Wt 294.0 lb

## 2013-10-27 DIAGNOSIS — D485 Neoplasm of uncertain behavior of skin: Secondary | ICD-10-CM

## 2013-10-27 DIAGNOSIS — D489 Neoplasm of uncertain behavior, unspecified: Secondary | ICD-10-CM

## 2013-10-27 NOTE — Progress Notes (Signed)
Pre visit review using our clinic review tool, if applicable. No additional management support is needed unless otherwise documented below in the visit note. 

## 2013-10-28 ENCOUNTER — Encounter: Payer: Self-pay | Admitting: Internal Medicine

## 2013-10-28 DIAGNOSIS — D485 Neoplasm of uncertain behavior of skin: Secondary | ICD-10-CM | POA: Insufficient documentation

## 2013-10-28 NOTE — Progress Notes (Signed)
Procedure Note :     Procedure :  Skin biopsy   Indication:  Changing mole (s ),  Suspicious lesion(s)   Risks including unsuccessful procedure , bleeding, infection, bruising, scar, a need for another complete procedure and others were explained to the patient in detail as well as the benefits. Informed consent was obtained and signed.   The patient was placed in a decubitus position.  Lesion #1 on  R inner elbow   measuring 11x5 mm   Skin over lesion #1  was prepped with Betadine and alcohol  and anesthetized with 1 cc of 2% lidocaine and epinephrine, using a 25-gauge 1 inch needle.  Shave biopsy with a sterile Dermablade was carried out in the usual fashion. Hyfrecator was used to destroy the rest of the lesion potentially left behind and for hemostasis. Band-Aid was applied with antibiotic ointment.        Postprocedure instructions :    A Band-Aid should be  changed twice daily. You can take a shower tomorrow.  Keep the wounds clean. You can wash them with liquid soap and water. Pat dry with gauze or a Kleenex tissue  Before applying antibiotic ointment and a Band-Aid.   You need to report immediately  if fever, chills or any signs of infection develop.    The biopsy results should be available in 1 -2 weeks.

## 2013-10-28 NOTE — Patient Instructions (Signed)
Postprocedure instructions :    A Band-Aid should be  changed twice daily. You can take a shower tomorrow.  Keep the wounds clean. You can wash them with liquid soap and water. Pat dry with gauze or a Kleenex tissue  Before applying antibiotic ointment and a Band-Aid.   You need to report immediately  if fever, chills or any signs of infection develop.    The biopsy results should be available in 1 -2 weeks. 

## 2013-10-28 NOTE — Assessment & Plan Note (Signed)
Skin bx 

## 2014-01-26 ENCOUNTER — Ambulatory Visit (INDEPENDENT_AMBULATORY_CARE_PROVIDER_SITE_OTHER): Payer: Commercial Managed Care - HMO | Admitting: Internal Medicine

## 2014-01-26 ENCOUNTER — Encounter: Payer: Self-pay | Admitting: Internal Medicine

## 2014-01-26 VITALS — BP 160/80 | HR 80 | Temp 98.2°F | Resp 16 | Wt 292.0 lb

## 2014-01-26 DIAGNOSIS — M1990 Unspecified osteoarthritis, unspecified site: Secondary | ICD-10-CM

## 2014-01-26 DIAGNOSIS — R030 Elevated blood-pressure reading, without diagnosis of hypertension: Secondary | ICD-10-CM

## 2014-01-26 DIAGNOSIS — G479 Sleep disorder, unspecified: Secondary | ICD-10-CM

## 2014-01-26 DIAGNOSIS — Z23 Encounter for immunization: Secondary | ICD-10-CM

## 2014-01-26 MED ORDER — TRIAZOLAM 0.25 MG PO TABS: ORAL_TABLET | ORAL | Status: DC

## 2014-01-26 NOTE — Progress Notes (Signed)
   Subjective:    HPI  F/u OA, insomnia, elev BP (nl BP at home) C/o B feet pain C/o L arm skin growth  BP Readings from Last 3 Encounters:  01/26/14 160/80  10/27/13 140/82  10/06/13 140/82   Wt Readings from Last 3 Encounters:  01/26/14 292 lb (132.45 kg)  10/27/13 294 lb (133.358 kg)  10/06/13 289 lb (131.09 kg)       Review of Systems  Constitutional: Negative for appetite change, fatigue and unexpected weight change.  HENT: Negative for congestion, nosebleeds, sneezing, sore throat and trouble swallowing.   Eyes: Negative for itching and visual disturbance.  Respiratory: Negative for cough.   Cardiovascular: Negative for chest pain, palpitations and leg swelling.  Gastrointestinal: Negative for nausea, diarrhea, blood in stool and abdominal distention.  Genitourinary: Negative for frequency, hematuria, decreased urine volume and testicular pain.  Musculoskeletal: Negative for back pain, gait problem, joint swelling and neck pain.  Skin: Negative for rash.  Neurological: Negative for dizziness, tremors, speech difficulty and weakness.  Psychiatric/Behavioral: Negative for sleep disturbance, dysphoric mood and agitation. The patient is not nervous/anxious.        Objective:   Physical Exam  Constitutional: He is oriented to person, place, and time. He appears well-developed. No distress.  Obese  HENT:  Head: Normocephalic and atraumatic.  Right Ear: External ear normal.  Left Ear: External ear normal.  Nose: Nose normal.  Mouth/Throat: Oropharynx is clear and moist. No oropharyngeal exudate.  Eyes: Conjunctivae and EOM are normal. Pupils are equal, round, and reactive to light. Right eye exhibits no discharge. Left eye exhibits no discharge. No scleral icterus.  Neck: Normal range of motion. Neck supple. No JVD present. No tracheal deviation present. No thyromegaly present.  Cardiovascular: Normal rate, regular rhythm, normal heart sounds and intact distal  pulses.  Exam reveals no gallop and no friction rub.   No murmur heard. Pulmonary/Chest: Effort normal and breath sounds normal. No stridor. No respiratory distress. He has no wheezes. He has no rales. He exhibits no tenderness.  Abdominal: Soft. Bowel sounds are normal. He exhibits no distension and no mass. There is no tenderness. There is no rebound and no guarding.  Genitourinary: Rectum normal, prostate normal and penis normal. Guaiac negative stool. No penile tenderness.  Musculoskeletal: Normal range of motion. He exhibits no edema and no tenderness.  Lymphadenopathy:    He has no cervical adenopathy.  Neurological: He is alert and oriented to person, place, and time. He has normal reflexes. No cranial nerve deficit. He exhibits normal muscle tone. Coordination normal.  Skin: Skin is warm and dry. No rash noted. He is not diaphoretic. No erythema. No pallor.  Psychiatric: He has a normal mood and affect. His behavior is normal. Judgment and thought content normal.    Lab Results  Component Value Date   WBC 6.9 10/06/2013   HGB 15.0 10/06/2013   HCT 45.1 10/06/2013   PLT 155.0 10/06/2013   GLUCOSE 113* 10/06/2013   CHOL 200 10/06/2013   TRIG 240.0* 10/06/2013   HDL 38.10* 10/06/2013   LDLCALC 114* 10/06/2013   ALT 38 10/06/2013   AST 23 10/06/2013   NA 138 10/06/2013   K 4.7 10/06/2013   CL 107 10/06/2013   CREATININE 0.8 10/06/2013   BUN 23 10/06/2013   CO2 21 10/06/2013   TSH 2.17 10/06/2013   PSA 4.15* 07/22/2011         Assessment & Plan:

## 2014-01-26 NOTE — Assessment & Plan Note (Signed)
BP Readings from Last 3 Encounters:  01/26/14 160/80  10/27/13 140/82  10/06/13 140/82

## 2014-01-26 NOTE — Assessment & Plan Note (Signed)
Chronic   Potential benefits of a long term benzodiazepines  use as well as potential risks  and complications were explained to the patient and were aknowledged. Triazolam prn

## 2014-01-26 NOTE — Progress Notes (Signed)
Pre visit review using our clinic review tool, if applicable. No additional management support is needed unless otherwise documented below in the visit note. 

## 2014-01-26 NOTE — Assessment & Plan Note (Signed)
Chronic  R>L knee Continue with current prescription therapy as reflected on the Med list.

## 2014-02-09 ENCOUNTER — Ambulatory Visit: Payer: Commercial Managed Care - HMO | Admitting: Internal Medicine

## 2014-03-13 ENCOUNTER — Telehealth: Payer: Self-pay | Admitting: *Deleted

## 2014-03-13 NOTE — Telephone Encounter (Signed)
Med List states #30 & 3 refills 01/26/14  Please verify

## 2014-03-13 NOTE — Telephone Encounter (Signed)
Rf req for Triazolam 0.25 mg 1 po qhs. Last filled 12/23/13. Ok to Rf in PCP?

## 2014-03-13 NOTE — Telephone Encounter (Signed)
Left mess for patient to call back.  

## 2014-03-15 NOTE — Telephone Encounter (Signed)
Pt called back and stated pharmacy fix their mistake and gave him refill for triazolam and he does not need another refill until December.

## 2014-06-15 HISTORY — PX: COLONOSCOPY: SHX174

## 2014-07-11 ENCOUNTER — Other Ambulatory Visit: Payer: Self-pay | Admitting: Internal Medicine

## 2014-07-12 NOTE — Telephone Encounter (Signed)
Called pharmacy spoke with [facility] gave md approval.../lmb

## 2014-09-27 ENCOUNTER — Ambulatory Visit: Payer: Medicare HMO | Admitting: Internal Medicine

## 2014-10-01 DIAGNOSIS — N138 Other obstructive and reflux uropathy: Secondary | ICD-10-CM | POA: Diagnosis not present

## 2014-10-01 DIAGNOSIS — N4340 Spermatocele of epididymis, unspecified: Secondary | ICD-10-CM | POA: Diagnosis not present

## 2014-10-01 DIAGNOSIS — N401 Enlarged prostate with lower urinary tract symptoms: Secondary | ICD-10-CM | POA: Diagnosis not present

## 2014-10-01 DIAGNOSIS — R972 Elevated prostate specific antigen [PSA]: Secondary | ICD-10-CM | POA: Diagnosis not present

## 2014-12-10 ENCOUNTER — Other Ambulatory Visit: Payer: Self-pay | Admitting: Internal Medicine

## 2014-12-11 NOTE — Telephone Encounter (Signed)
Please advise, thanks.

## 2014-12-13 ENCOUNTER — Other Ambulatory Visit: Payer: Self-pay

## 2014-12-13 MED ORDER — TRIAZOLAM 0.25 MG PO TABS: 0.2500 mg | ORAL_TABLET | Freq: Every evening | ORAL | Status: DC | PRN

## 2014-12-13 NOTE — Telephone Encounter (Signed)
triazalam faxed to pharm

## 2015-02-05 ENCOUNTER — Telehealth: Payer: Self-pay | Admitting: Internal Medicine

## 2015-02-07 ENCOUNTER — Encounter: Payer: Self-pay | Admitting: Internal Medicine

## 2015-02-07 ENCOUNTER — Ambulatory Visit (INDEPENDENT_AMBULATORY_CARE_PROVIDER_SITE_OTHER): Payer: Commercial Managed Care - HMO | Admitting: Internal Medicine

## 2015-02-07 ENCOUNTER — Other Ambulatory Visit (INDEPENDENT_AMBULATORY_CARE_PROVIDER_SITE_OTHER): Payer: Commercial Managed Care - HMO

## 2015-02-07 VITALS — BP 150/82 | HR 84 | Temp 97.6°F | Resp 18 | Wt 306.0 lb

## 2015-02-07 DIAGNOSIS — R319 Hematuria, unspecified: Secondary | ICD-10-CM | POA: Diagnosis not present

## 2015-02-07 LAB — POCT URINALYSIS DIPSTICK
Bilirubin, UA: NEGATIVE
Glucose, UA: NEGATIVE
Ketones, UA: NEGATIVE
Leukocytes, UA: NEGATIVE
Nitrite, UA: NEGATIVE
Protein, UA: NEGATIVE
Spec Grav, UA: 1.025
Urobilinogen, UA: 1
pH, UA: 6

## 2015-02-07 LAB — BASIC METABOLIC PANEL
BUN: 20 mg/dL (ref 6–23)
CO2: 28 mEq/L (ref 19–32)
Calcium: 9.8 mg/dL (ref 8.4–10.5)
Chloride: 107 mEq/L (ref 96–112)
Creatinine, Ser: 0.91 mg/dL (ref 0.40–1.50)
GFR: 88.15 mL/min (ref >60.00)
Glucose, Bld: 98 mg/dL (ref 70–99)
Potassium: 4.5 mEq/L (ref 3.5–5.1)
Sodium: 141 mEq/L (ref 135–145)

## 2015-02-07 LAB — CBC WITH DIFFERENTIAL/PLATELET
Basophils Absolute: 0 10*3/uL (ref 0.0–0.1)
Basophils Relative: 0.4 % (ref 0.0–3.0)
Eosinophils Absolute: 0.5 10*3/uL (ref 0.0–0.7)
Eosinophils Relative: 7.3 % — ABNORMAL HIGH (ref 0.0–5.0)
HCT: 43.5 % (ref 39.0–52.0)
Hemoglobin: 14.6 g/dL (ref 13.0–17.0)
Lymphocytes Relative: 27.7 % (ref 12.0–46.0)
Lymphs Abs: 1.9 10*3/uL (ref 0.7–4.0)
MCHC: 33.5 g/dL (ref 30.0–36.0)
MCV: 89.2 fl (ref 78.0–100.0)
Monocytes Absolute: 0.5 10*3/uL (ref 0.1–1.0)
Monocytes Relative: 7.3 % (ref 3.0–12.0)
Neutro Abs: 3.9 10*3/uL (ref 1.4–7.7)
Neutrophils Relative %: 57.3 % (ref 43.0–77.0)
Platelets: 164 10*3/uL (ref 150.0–400.0)
RBC: 4.88 Mil/uL (ref 4.22–5.81)
RDW: 13.5 % (ref 11.5–15.5)
WBC: 6.8 10*3/uL (ref 4.0–10.5)

## 2015-02-07 NOTE — Patient Instructions (Signed)
  Your next office appointment will be determined based upon review of your pending labs  and  Culture.  Those written interpretation of the lab results and instructions will be transmitted to you by My Chart   Critical results will be called.   Followup as needed for any active or acute issue. Please report any significant change in your symptoms.

## 2015-02-07 NOTE — Progress Notes (Signed)
   Subjective:    Patient ID: Clinton Black, male    DOB: Feb 07, 1946, 69 y.o.   MRN: [id number]  HPI  On 02/03/15 he noted coffee-colored urine which has cleared gradually but remains dark yellow. He also noted some dysuria and tingling with urination. He had been sweating as he was working at [employer]. He did try to stay well hydrated with water, Gatorade, and apple juice. He was taking an over-the-counter pain medicine "Pain Away" as well as diclofenac twice a day for arthritic pain.  He does have some chronic urinary incontinence & is on generic Proscar from Dr. Karsten Ro.   He's had occasional rectal bleeding. He has a history of colon polyps. He believes his last colonoscopy was 10 years ago. Dr Alain Marion has encouraged him to have a colonoscopy;but he has not scheduled this as yet.  He's had a benign intrascrotal lesion which is followed by the urologist with ultrasound. He denies history of kidney stones.    Review of Systems  He denies pyuria, fever, or chills.  Epistaxis, hemoptysis, or melena denied. No unexplained weight loss, significant dyspepsia,dysphagia, or abdominal pain.  There is no abnormal bruising , bleeding, or difficulty stopping bleeding with injury.      Objective:   Physical Exam  Pertinent or positive findings include: BMI 36.28. There is decrease in the thickness of the eyebrows laterally. He has bilateral ptosis. There is asymmetry of the nasolabial folds. Heart sounds are distant with an S4. Abdomen is massive.  General appearance :adequately nourished; in no distress.  Eyes: No conjunctival inflammation or scleral icterus is present.  Oral exam:  Lips and gums are healthy appearing.There is no oropharyngeal erythema or exudate noted. Dental hygiene is good.  Heart:  Normal rate and regular rhythm. S1 and S2 normal without gallop, murmur, click, rub .    Lungs:Chest clear to auscultation; no wheezes, rhonchi,rales ,or rubs present.No  increased work of breathing.   Abdomen: bowel sounds normal, soft and non-tender without masses, organomegaly or hernias noted.  No guarding or rebound. No flank tenderness to percussion.  Vascular : all pulses equal ; no bruits present.  Skin:Warm & dry.  Intact without suspicious lesions or rashes ; no tenting or jaundice   Lymphatic: No lymphadenopathy is noted about the head, neck, axilla.   Neuro: Strength, tone normal.        Assessment & Plan:  #1 hematuria; urinalysis reveals 2+ blood there are no leukocytes or bilirubin.  #2 history of intermittent rectal bleeding as well as colon polyps. Colonoscopy recommended. CBC will be checked  Plan: See orders and recommendations

## 2015-02-07 NOTE — Progress Notes (Signed)
Pre visit review using our clinic review tool, if applicable. No additional management support is needed unless otherwise documented below in the visit note. 

## 2015-02-08 LAB — URINE CULTURE
Colony Count: NO GROWTH
Organism ID, Bacteria: NO GROWTH

## 2015-02-11 ENCOUNTER — Other Ambulatory Visit: Payer: Self-pay | Admitting: Internal Medicine

## 2015-02-11 DIAGNOSIS — R310 Gross hematuria: Secondary | ICD-10-CM | POA: Insufficient documentation

## 2015-02-13 ENCOUNTER — Telehealth: Payer: Self-pay | Admitting: *Deleted

## 2015-02-13 NOTE — Telephone Encounter (Signed)
Pt is needing to see Dr. Lorin Mercy to get a refill. He is requesting a referral from [place]. I am not sure for what condition. I tried calling and could not reach him.

## 2015-02-14 ENCOUNTER — Telehealth: Payer: Self-pay | Admitting: Internal Medicine

## 2015-02-14 DIAGNOSIS — R310 Gross hematuria: Secondary | ICD-10-CM | POA: Diagnosis not present

## 2015-02-14 DIAGNOSIS — Z1211 Encounter for screening for malignant neoplasm of colon: Secondary | ICD-10-CM

## 2015-02-14 NOTE — Telephone Encounter (Signed)
Patient is requesting a [facility] referral to GI for a colonoscopy. This request came via mychart.

## 2015-02-15 NOTE — Telephone Encounter (Signed)
Ok Thx 

## 2015-02-19 ENCOUNTER — Encounter: Payer: Self-pay | Admitting: Gastroenterology

## 2015-02-21 DIAGNOSIS — N281 Cyst of kidney, acquired: Secondary | ICD-10-CM | POA: Diagnosis not present

## 2015-02-21 DIAGNOSIS — R310 Gross hematuria: Secondary | ICD-10-CM | POA: Diagnosis not present

## 2015-02-21 DIAGNOSIS — K760 Fatty (change of) liver, not elsewhere classified: Secondary | ICD-10-CM | POA: Diagnosis not present

## 2015-02-21 DIAGNOSIS — N200 Calculus of kidney: Secondary | ICD-10-CM | POA: Diagnosis not present

## 2015-02-21 DIAGNOSIS — K5730 Diverticulosis of large intestine without perforation or abscess without bleeding: Secondary | ICD-10-CM | POA: Diagnosis not present

## 2015-02-28 DIAGNOSIS — R222 Localized swelling, mass and lump, trunk: Secondary | ICD-10-CM | POA: Diagnosis not present

## 2015-02-28 DIAGNOSIS — N401 Enlarged prostate with lower urinary tract symptoms: Secondary | ICD-10-CM | POA: Diagnosis not present

## 2015-02-28 DIAGNOSIS — N138 Other obstructive and reflux uropathy: Secondary | ICD-10-CM | POA: Diagnosis not present

## 2015-02-28 DIAGNOSIS — R310 Gross hematuria: Secondary | ICD-10-CM | POA: Diagnosis not present

## 2015-03-25 ENCOUNTER — Ambulatory Visit (AMBULATORY_SURGERY_CENTER): Payer: Self-pay

## 2015-03-25 VITALS — Ht 77.0 in | Wt 306.0 lb

## 2015-03-25 DIAGNOSIS — Z1211 Encounter for screening for malignant neoplasm of colon: Secondary | ICD-10-CM

## 2015-03-25 NOTE — Progress Notes (Signed)
No egg or soy allergies Not on home 02 Hallucination following anesthesia in childhood x1. No other complications No diet or weight loss meds

## 2015-04-08 ENCOUNTER — Encounter: Payer: Medicare HMO | Admitting: Gastroenterology

## 2015-04-11 DIAGNOSIS — M170 Bilateral primary osteoarthritis of knee: Secondary | ICD-10-CM | POA: Diagnosis not present

## 2015-04-11 DIAGNOSIS — M25561 Pain in right knee: Secondary | ICD-10-CM | POA: Diagnosis not present

## 2015-04-11 DIAGNOSIS — M25562 Pain in left knee: Secondary | ICD-10-CM | POA: Diagnosis not present

## 2015-04-25 ENCOUNTER — Telehealth: Payer: Self-pay | Admitting: Internal Medicine

## 2015-04-25 DIAGNOSIS — M25569 Pain in unspecified knee: Secondary | ICD-10-CM

## 2015-04-25 NOTE — Telephone Encounter (Signed)
I'm not sure if [facility] is a covered option. Let me know what doctor to reffer to. Thx

## 2015-04-25 NOTE — Telephone Encounter (Signed)
Pt is requesting a referral for [facility] so he can get an injection in his knee

## 2015-04-26 NOTE — Telephone Encounter (Signed)
Left message to call back with the name of a doctor that he wants referral to.

## 2015-04-26 NOTE — Telephone Encounter (Signed)
I called pt- no answer. Left mess for patient to call back.

## 2015-04-26 NOTE — Telephone Encounter (Signed)
Pt call back and he said that he wants this referral to go to [facility] on [address]. Please give him call back and its okey to leave detail massage if no answer  Phone # [phone]

## 2015-04-29 NOTE — Telephone Encounter (Signed)
ok 

## 2015-04-29 NOTE — Telephone Encounter (Signed)
Patient called. Advised that the doctor's name is Lesly Dukes, MD. [facility] on [address]

## 2015-05-02 ENCOUNTER — Encounter: Payer: Self-pay | Admitting: Internal Medicine

## 2015-05-02 ENCOUNTER — Ambulatory Visit (AMBULATORY_SURGERY_CENTER): Payer: Commercial Managed Care - HMO | Admitting: Internal Medicine

## 2015-05-02 VITALS — BP 121/60 | HR 66 | Temp 96.3°F | Resp 20 | Ht 77.0 in | Wt 306.0 lb

## 2015-05-02 DIAGNOSIS — Z1211 Encounter for screening for malignant neoplasm of colon: Secondary | ICD-10-CM | POA: Diagnosis present

## 2015-05-02 LAB — HM COLONOSCOPY

## 2015-05-02 MED ORDER — SODIUM CHLORIDE 0.9 % IV SOLN: 500.0000 mL | INTRAVENOUS | Status: DC

## 2015-05-02 NOTE — Patient Instructions (Addendum)
You have diverticulosis and hemorrhoids. Diverticulosis - thickened muscle rings and pouches in the colon wall. Please read the handout about this condition.  If you have hemorrhoid problems (swelling, itching, bleeding) I am able to treat those with an in-office procedure. If you like, please call my office at [phone] to schedule an appointment and I can evaluate you further.  Next routine colonoscopy/screening test in 10 years - 2026  I appreciate the opportunity to care for you. Gatha Mayer, MD, FACG  YOU HAD AN ENDOSCOPIC PROCEDURE TODAY AT [facility]:   Refer to the procedure report that was given to you for any specific questions about what was found during the examination.  If the procedure report does not answer your questions, please call your gastroenterologist to clarify.  If you requested that your care partner not be given the details of your procedure findings, then the procedure report has been included in a sealed envelope for you to review at your convenience later.  YOU SHOULD EXPECT: Some feelings of bloating in the abdomen. Passage of more gas than usual.  Walking can help get rid of the air that was put into your GI tract during the procedure and reduce the bloating. If you had a lower endoscopy (such as a colonoscopy or flexible sigmoidoscopy) you may notice spotting of blood in your stool or on the toilet paper. If you underwent a bowel prep for your procedure, you may not have a normal bowel movement for a few days.  Please Note:  You might notice some irritation and congestion in your nose or some drainage.  This is from the oxygen used during your procedure.  There is no need for concern and it should clear up in a day or so.  SYMPTOMS TO REPORT IMMEDIATELY:   Following lower endoscopy (colonoscopy or flexible sigmoidoscopy):  Excessive amounts of blood in the stool  Significant tenderness or worsening of abdominal pains  Swelling of the  abdomen that is new, acute  Fever of 100F or higher  For urgent or emergent issues, a gastroenterologist can be reached at any hour by calling [phone].   DIET: Your first meal following the procedure should be a small meal and then it is ok to progress to your normal diet. Heavy or fried foods are harder to digest and may make you feel nauseous or bloated.  Likewise, meals heavy in dairy and vegetables can increase bloating.  Drink plenty of fluids but you should avoid alcoholic beverages for 24 hours.  ACTIVITY:  You should plan to take it easy for the rest of today and you should NOT DRIVE or use heavy machinery until tomorrow (because of the sedation medicines used during the test).    FOLLOW UP: Our staff will call the number listed on your records the next business day following your procedure to check on you and address any questions or concerns that you may have regarding the information given to you following your procedure. If we do not reach you, we will leave a message.  However, if you are feeling well and you are not experiencing any problems, there is no need to return our call.  We will assume that you have returned to your regular daily activities without incident.  SIGNATURES/CONFIDENTIALITY: You and/or your care partner have signed paperwork which will be entered into your electronic medical record.  These signatures attest to the fact that that the information above on your After Visit Summary has  been reviewed and is understood.  Full responsibility of the confidentiality of this discharge information lies with you and/or your care-partner.  Continue your normal medications  Please read over handouts about diverticulosis and high fiber diets

## 2015-05-02 NOTE — Op Note (Signed)
[address] [place], [address]   COLONOSCOPY PROCEDURE REPORT  PATIENT: Clinton Black, Clinton Black  MR#: [id number] BIRTHDATE: 07-Dec-1945 , 69  yrs. old GENDER: male ENDOSCOPIST: Gatha Mayer, MD, [facility] PROCEDURE DATE:  05/02/2015 PROCEDURE:   Colonoscopy, screening First Screening Colonoscopy - Avg.  risk and is 50 yrs.  old or older - No.  Prior Negative Screening - Now for repeat screening. 10 or more years since last screening  History of Adenoma - Now for follow-up colonoscopy & has been > or = to 3 yrs.  N/A  Polyps removed today? No Recommend repeat exam, <10 yrs? No ASA CLASS:   Class III INDICATIONS:Screening for colonic neoplasia and Colorectal Neoplasm Risk Assessment for this procedure is average risk. MEDICATIONS: Propofol 350 mg IV and Monitored anesthesia care  DESCRIPTION OF PROCEDURE:   After the risks benefits and alternatives of the procedure were thoroughly explained, informed consent was obtained.  The digital rectal exam revealed no abnormalities of the rectum, revealed no prostatic nodules, and revealed the prostate was not enlarged.   The [id number] endoscope was introduced through the anus and advanced to the cecum, which was identified by both the appendix and ileocecal valve. No adverse events experienced.   The quality of the prep was good.  (MiraLax was used)  The instrument was then slowly withdrawn as the colon was fully examined. Estimated blood loss is zero unless otherwise noted in this procedure report.      COLON FINDINGS: There was moderate diverticulosis noted in the left colon.   The examination was otherwise normal.  Retroflexed views revealed no abnormalities. The time to cecum = 2.9 Withdrawal time = 10.2   The scope was withdrawn and the procedure completed. COMPLICATIONS: There were no immediate complications.  ENDOSCOPIC IMPRESSION: 1.   Moderate diverticulosis was noted in the left colon 2.    The examination was otherwise normal  RECOMMENDATIONS: Repeat colonoscopy/screening test 10 years.  2026  eSigned:  Gatha Mayer, MD, [facility] 05/02/2015 4:11 PM   cc: Dr. Walker Kehr and The Patient

## 2015-05-02 NOTE — Progress Notes (Signed)
Patient awakening,vss,report to rn 

## 2015-05-03 ENCOUNTER — Telehealth: Payer: Self-pay | Admitting: *Deleted

## 2015-05-03 NOTE — Telephone Encounter (Signed)
  Follow up Call-  Call back number 05/02/2015  Post procedure Call Back phone  # [phone] hm  Permission to leave phone message Yes    [facility]

## 2015-05-06 DIAGNOSIS — M1711 Unilateral primary osteoarthritis, right knee: Secondary | ICD-10-CM | POA: Diagnosis not present

## 2015-05-06 DIAGNOSIS — M25561 Pain in right knee: Secondary | ICD-10-CM | POA: Diagnosis not present

## 2015-05-06 DIAGNOSIS — M25562 Pain in left knee: Secondary | ICD-10-CM | POA: Diagnosis not present

## 2015-05-06 DIAGNOSIS — M170 Bilateral primary osteoarthritis of knee: Secondary | ICD-10-CM | POA: Diagnosis not present

## 2015-05-06 DIAGNOSIS — R262 Difficulty in walking, not elsewhere classified: Secondary | ICD-10-CM | POA: Diagnosis not present

## 2015-05-13 DIAGNOSIS — M25561 Pain in right knee: Secondary | ICD-10-CM | POA: Diagnosis not present

## 2015-05-13 DIAGNOSIS — M1711 Unilateral primary osteoarthritis, right knee: Secondary | ICD-10-CM | POA: Diagnosis not present

## 2015-05-20 DIAGNOSIS — M25561 Pain in right knee: Secondary | ICD-10-CM | POA: Diagnosis not present

## 2015-05-20 DIAGNOSIS — M1711 Unilateral primary osteoarthritis, right knee: Secondary | ICD-10-CM | POA: Diagnosis not present

## 2015-05-27 DIAGNOSIS — M1711 Unilateral primary osteoarthritis, right knee: Secondary | ICD-10-CM | POA: Diagnosis not present

## 2015-05-27 DIAGNOSIS — M25561 Pain in right knee: Secondary | ICD-10-CM | POA: Diagnosis not present

## 2015-06-06 DIAGNOSIS — M25561 Pain in right knee: Secondary | ICD-10-CM | POA: Diagnosis not present

## 2015-06-06 DIAGNOSIS — M1711 Unilateral primary osteoarthritis, right knee: Secondary | ICD-10-CM | POA: Diagnosis not present

## 2015-06-17 ENCOUNTER — Other Ambulatory Visit: Payer: Self-pay | Admitting: Internal Medicine

## 2015-06-19 NOTE — Telephone Encounter (Signed)
Done

## 2015-07-08 DIAGNOSIS — M1990 Unspecified osteoarthritis, unspecified site: Secondary | ICD-10-CM | POA: Diagnosis not present

## 2015-08-19 ENCOUNTER — Encounter: Payer: Self-pay | Admitting: Family Medicine

## 2015-08-19 ENCOUNTER — Ambulatory Visit (INDEPENDENT_AMBULATORY_CARE_PROVIDER_SITE_OTHER): Payer: Commercial Managed Care - HMO | Admitting: Family Medicine

## 2015-08-19 VITALS — BP 140/80 | HR 90 | Temp 97.6°F | Wt 306.9 lb

## 2015-08-19 DIAGNOSIS — R05 Cough: Secondary | ICD-10-CM

## 2015-08-19 DIAGNOSIS — R059 Cough, unspecified: Secondary | ICD-10-CM

## 2015-08-19 DIAGNOSIS — J209 Acute bronchitis, unspecified: Secondary | ICD-10-CM | POA: Diagnosis not present

## 2015-08-19 MED ORDER — PREDNISONE 10 MG PO TABS
ORAL_TABLET | ORAL | Status: DC
Start: 2015-08-19 — End: 2016-05-11

## 2015-08-19 MED ORDER — HYDROCODONE-HOMATROPINE 5-1.5 MG/5ML PO SYRP: 5.0000 mL | ORAL_SOLUTION | Freq: Three times a day (TID) | ORAL | Status: DC | PRN

## 2015-08-19 NOTE — Progress Notes (Signed)
Pre visit review using our clinic review tool, if applicable. No additional management support is needed unless otherwise documented below in the visit note. 

## 2015-08-19 NOTE — Progress Notes (Signed)
Subjective:    Patient ID: Clinton Black, male    DOB: [birth year], 70 y.o.   MRN: [id number]  HPI  Clinton Black is a 70 year old male who presents today with congestion, sinus pressure/pain, cough that is productive of green sputum, rhinitis, fatigue, and chills for 3 days. He denies fever and sweats.  He denies a history of asthma but reports 3 episodes of bronchitis in 7 years. Mucinex DM and Theraflu at home which provided moderate benefit. Associated symptoms of decreased appetite and decreased fluid intake. Patient reports urinating 4-5 times today and urine noted as yellow. No antibiotics in last 30 days.    Review of Systems  Constitutional: Positive for chills and fatigue. Negative for fever.  HENT: Positive for congestion, postnasal drip, rhinorrhea and sinus pressure. Negative for ear pain, sneezing and sore throat.   Respiratory: Positive for cough and shortness of breath. Negative for chest tightness.   Cardiovascular: Negative for chest pain, palpitations and leg swelling.  Gastrointestinal: Negative for nausea, vomiting, diarrhea and constipation.  Genitourinary: Negative for dysuria.  Musculoskeletal: Positive for myalgias.  Skin: Negative for rash.  Neurological: Positive for headaches. Negative for dizziness.   Past Medical History  Diagnosis Date  . Arthritis     Social History   Social History  . Marital Status: Single    Spouse Name: N/A  . Number of Children: N/A  . Years of Education: N/A   Occupational History  . Not on file.   Social History Main Topics  . Smoking status: Former Smoker    Quit date: 06/15/1994  . Smokeless tobacco: Never Used  . Alcohol Use: 8.4 oz/week    0 Standard drinks or equivalent, 14 Shots of liquor per week     Comment: 2 drinks of liquor per day  . Drug Use: Yes     Comment: occasionally  . Sexual Activity:    Partners: Female    Museum/gallery curator: Condom     Comment: condom use most of the time but not  always   Other Topics Concern  . Not on file   [place] - [employer] and [employer]. married '72- 3 years/divorced; married '89- 3 years/divorced;. married '03 - 3 years/divorced. 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    Past Surgical History  Procedure Laterality Date  . Tonsillectomy    . Knee surgery  2000  . Cyst excision      polynomial  . Colonoscopy    . Tonsillectomy    . Widson teeth extraction      Family History  Problem Relation Age of Onset  . Aneurysm Father   . Heart disease Father   . Cancer Neg Hx   . Colon cancer Neg Hx   . Esophageal cancer Neg Hx   . Rectal cancer Neg Hx   . Stomach cancer Neg Hx     No Known Allergies  Current Outpatient Prescriptions on File Prior to Visit  Medication Sig Dispense Refill  . aspirin 81 MG tablet Take 81 mg by mouth daily.    . Avanafil (STENDRA) 100 MG TABS Take 100 mg by mouth daily as needed. 12 tablet 5  . Coenzyme Q10 (COQ10 MAXIMUM STRENGTH PO) Take 1 tablet by mouth daily. 250mg     . diclofenac (VOLTAREN) 75 MG EC tablet Take 75 mg by mouth 2 (two) times daily.    . finasteride (PROSCAR) 5 MG tablet Take 5 mg by mouth daily.    Marland Kitchen  fish oil-omega-3 fatty acids 1000 MG capsule Take 2 g by mouth daily.    Marland Kitchen glucosamine-chondroitin 500-400 MG tablet Take 1 tablet by mouth daily.    . triazolam (HALCION) 0.25 MG tablet TAKE 1 TABLET BY MOUTH AT BEDTIME AS NEEDED FOR SLEEP 30 tablet 2   No current facility-administered medications on file prior to visit.    BP 140/80 mmHg  Pulse 90  Temp(Src) 97.6 F (36.4 C) (Oral)  Wt 306 lb 14.4 oz (139.209 kg)  SpO2 95%       Objective:   Physical Exam  Constitutional: He is oriented to person, place, and time. He appears well-developed and well-nourished.  HENT:  Nose: Rhinorrhea present. Right sinus exhibits maxillary sinus tenderness and frontal sinus tenderness. Left sinus exhibits maxillary sinus tenderness and frontal sinus tenderness.    Mouth/Throat: Mucous membranes are normal. No oropharyngeal exudate or posterior oropharyngeal erythema.  TMs dull bilaterally  Cardiovascular: Normal rate and regular rhythm.   Pulmonary/Chest: Effort normal. He has wheezes.  Scattered expiratory wheezes and rhonchi noted bilaterally. No rales or suspected consolidation noted. No accessory muscle use, diaphoresis, or tachypnea  Abdominal: Soft. Bowel sounds are normal.  Lymphadenopathy:    He has cervical adenopathy.  Neurological: He is alert and oriented to person, place, and time.  Skin: Skin is warm and dry. No rash noted.  Psychiatric: He has a normal mood and affect. His behavior is normal.       Assessment & Plan:  1. Acute bronchitis, unspecified organism Supportive measures of rest, increase fluids, and tylenol for discomfort. In office albuterol treatment provided and patient stated that he had improved WOB after treatment. Oxygen saturation noted as 95%. Expiratory wheezing is still noted but decreased after treatment. - predniSONE (DELTASONE) 10 MG tablet; Take 4 tablets by mouth once daily for 2 days, 3 tablets once daily for 2 days, 2 tablets once daily for 2 days, and one tablet for 2 days.  Dispense: 20 tablet; Refill: 0  2. Cough  - predniSONE (DELTASONE) 10 MG tablet; Take 4 tablets by mouth once daily for 2 days, 3 tablets once daily for 2 days, 2 tablets once daily for 2 days, and one tablet for 2 days.  Dispense: 20 tablet; Refill: 0 - HYDROcodone-homatropine (HYCODAN) 5-1.5 MG/5ML syrup; Take 5 mLs by mouth every 8 (eight) hours as needed for cough.  Dispense: 120 mL; Refill: 0  Advised patient on supportive measures:  Get rest, drink plenty of fluids, and use tylenol or ibuprofen as needed for pain. Follow up with primary care provider if fever >101, if symptoms worsen or if symptoms are not improved in 3-4 days. Patient verbalizes understanding.

## 2015-08-19 NOTE — Patient Instructions (Addendum)
Please take prednisone as prescribed with food. Cough medication can be used at night to decrease cough for sleep. Please be aware that this medication can cause drowsiness or dizziness. Do not drive while taking this medication and use sparingly.  If symptoms do not improve in 3-4 days, worsen, or you develop a fever >101, please follow up with your primary care physician.  Acute Bronchitis Bronchitis is inflammation of the airways that extend from the windpipe into the lungs (bronchi). The inflammation often causes mucus to develop. This leads to a cough, which is the most common symptom of bronchitis.  In acute bronchitis, the condition usually develops suddenly and goes away over time, usually in a couple weeks. Smoking, allergies, and asthma can make bronchitis worse. Repeated episodes of bronchitis may cause further lung problems.  CAUSES Acute bronchitis is most often caused by the same virus that causes a cold. The virus can spread from person to person (contagious) through coughing, sneezing, and touching contaminated objects. SIGNS AND SYMPTOMS   Cough.   Fever.   Coughing up mucus.   Body aches.   Chest congestion.   Chills.   Shortness of breath.   Sore throat.  DIAGNOSIS  Acute bronchitis is usually diagnosed through a physical exam. Your health care provider will also ask you questions about your medical history. Tests, such as chest X-rays, are sometimes done to rule out other conditions.  TREATMENT  Acute bronchitis usually goes away in a couple weeks. Oftentimes, no medical treatment is necessary. Medicines are sometimes given for relief of fever or cough. Antibiotic medicines are usually not needed but may be prescribed in certain situations. In some cases, an inhaler may be recommended to help reduce shortness of breath and control the cough. A cool mist vaporizer may also be used to help thin bronchial secretions and make it easier to clear the chest.  HOME  CARE INSTRUCTIONS  Get plenty of rest.   Drink enough fluids to keep your urine clear or pale yellow (unless you have a medical condition that requires fluid restriction). Increasing fluids may help thin your respiratory secretions (sputum) and reduce chest congestion, and it will prevent dehydration.   Take medicines only as directed by your health care provider.  If you were prescribed an antibiotic medicine, finish it all even if you start to feel better.  Avoid smoking and secondhand smoke. Exposure to cigarette smoke or irritating chemicals will make bronchitis worse. If you are a smoker, consider using nicotine gum or skin patches to help control withdrawal symptoms. Quitting smoking will help your lungs heal faster.   Reduce the chances of another bout of acute bronchitis by washing your hands frequently, avoiding people with cold symptoms, and trying not to touch your hands to your mouth, nose, or eyes.   Keep all follow-up visits as directed by your health care provider.  SEEK MEDICAL CARE IF: Your symptoms do not improve after 1 week of treatment.  SEEK IMMEDIATE MEDICAL CARE IF:  You develop an increased fever or chills.   You have chest pain.   You have severe shortness of breath.  You have bloody sputum.   You develop dehydration.  You faint or repeatedly feel like you are going to pass out.  You develop repeated vomiting.  You develop a severe headache. MAKE SURE YOU:   Understand these instructions.  Will watch your condition.  Will get help right away if you are not doing well or get worse.   This  information is not intended to replace advice given to you by your health care provider. Make sure you discuss any questions you have with your health care provider.   Document Released: 07/09/2004 Document Revised: 06/22/2014 Document Reviewed: 11/22/2012 Elsevier Interactive Patient Education [employer].

## 2015-08-27 ENCOUNTER — Other Ambulatory Visit: Payer: Self-pay | Admitting: Adult Health

## 2015-08-27 ENCOUNTER — Encounter: Payer: Self-pay | Admitting: Adult Health

## 2015-08-27 ENCOUNTER — Ambulatory Visit (INDEPENDENT_AMBULATORY_CARE_PROVIDER_SITE_OTHER)
Admission: RE | Admit: 2015-08-27 | Discharge: 2015-08-27 | Disposition: A | Discharge status: Home / Self Care | Payer: Commercial Managed Care - HMO | Source: Ambulatory Visit | Attending: Adult Health | Admitting: Adult Health

## 2015-08-27 ENCOUNTER — Ambulatory Visit (INDEPENDENT_AMBULATORY_CARE_PROVIDER_SITE_OTHER): Payer: Commercial Managed Care - HMO | Admitting: Adult Health

## 2015-08-27 VITALS — BP 108/70 | HR 110 | Temp 97.8°F | Ht 77.0 in | Wt 305.2 lb

## 2015-08-27 DIAGNOSIS — R05 Cough: Secondary | ICD-10-CM | POA: Diagnosis not present

## 2015-08-27 DIAGNOSIS — R0602 Shortness of breath: Secondary | ICD-10-CM | POA: Diagnosis not present

## 2015-08-27 DIAGNOSIS — R059 Cough, unspecified: Secondary | ICD-10-CM

## 2015-08-27 IMAGING — DX DG CHEST 2V
2 series · 2 of 2 positions shown · non-contrast
Comparison: Chest x-ray of [DATE]

CLINICAL DATA: Cough, congestion, shortness of breath for 1 week

EXAM:
CHEST  2 VIEW

[chest pa]
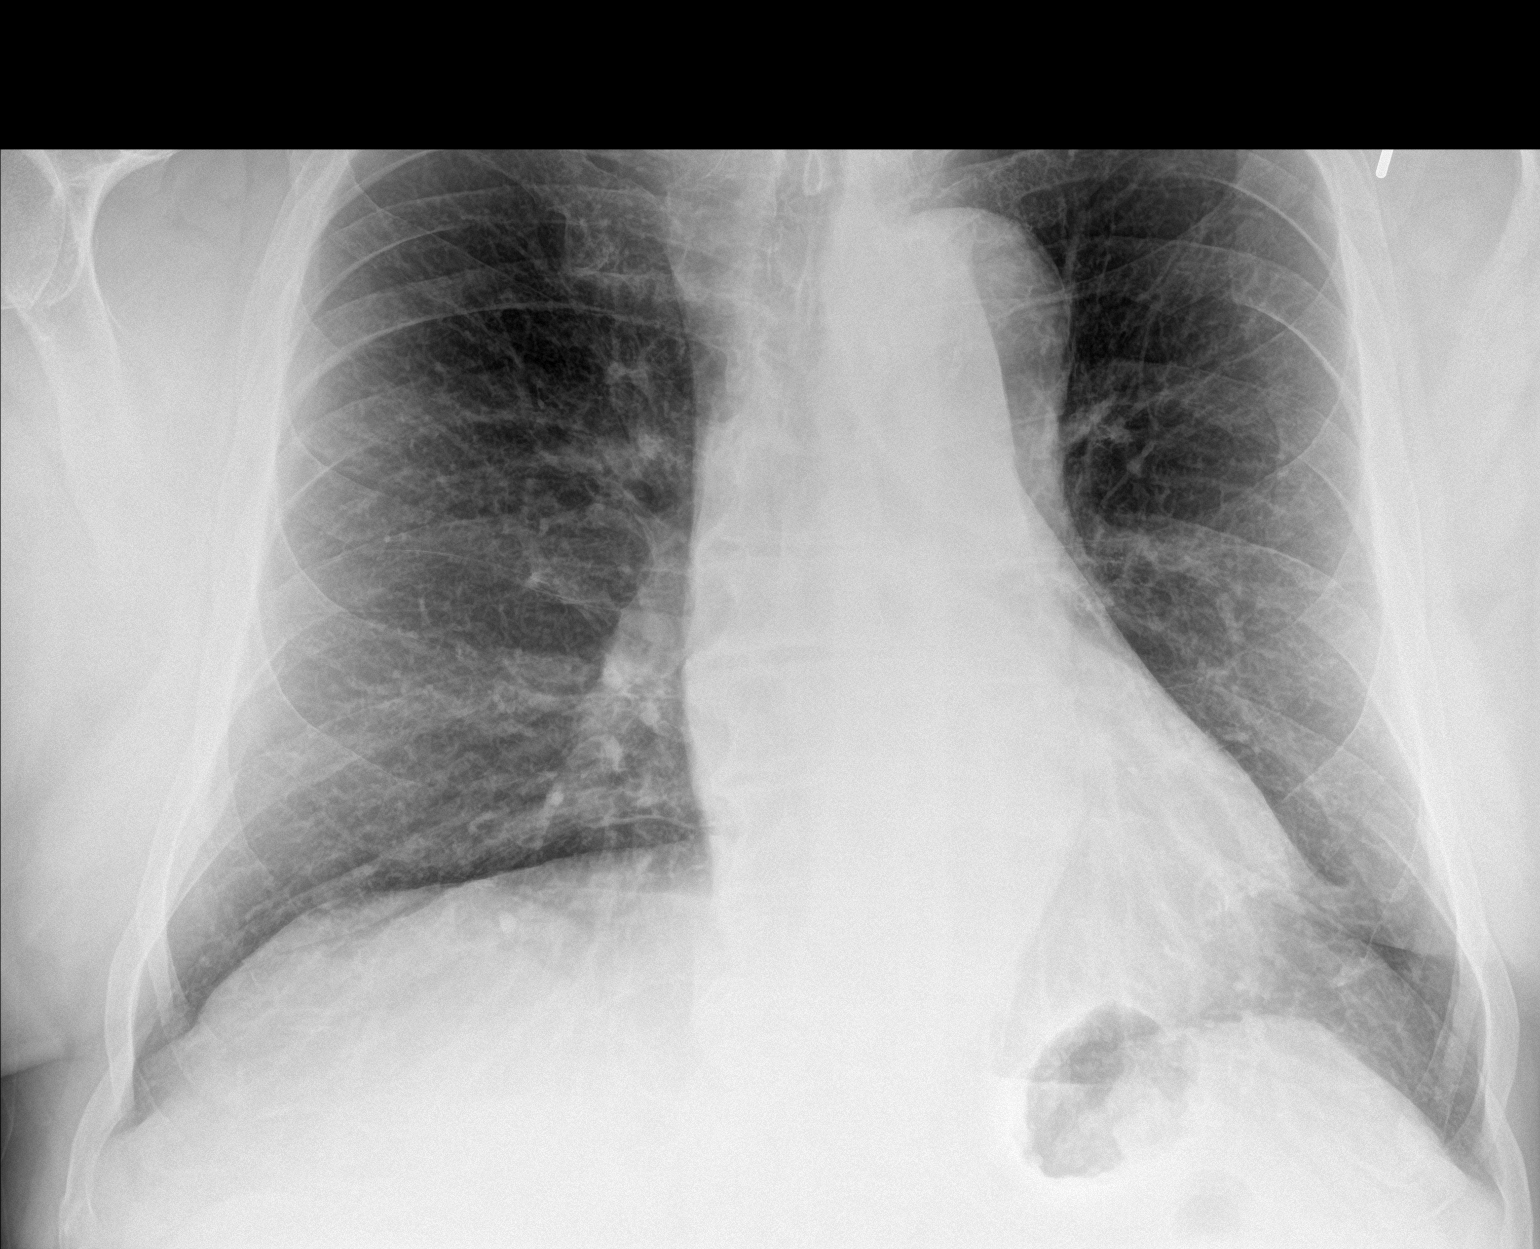

[chest lat]
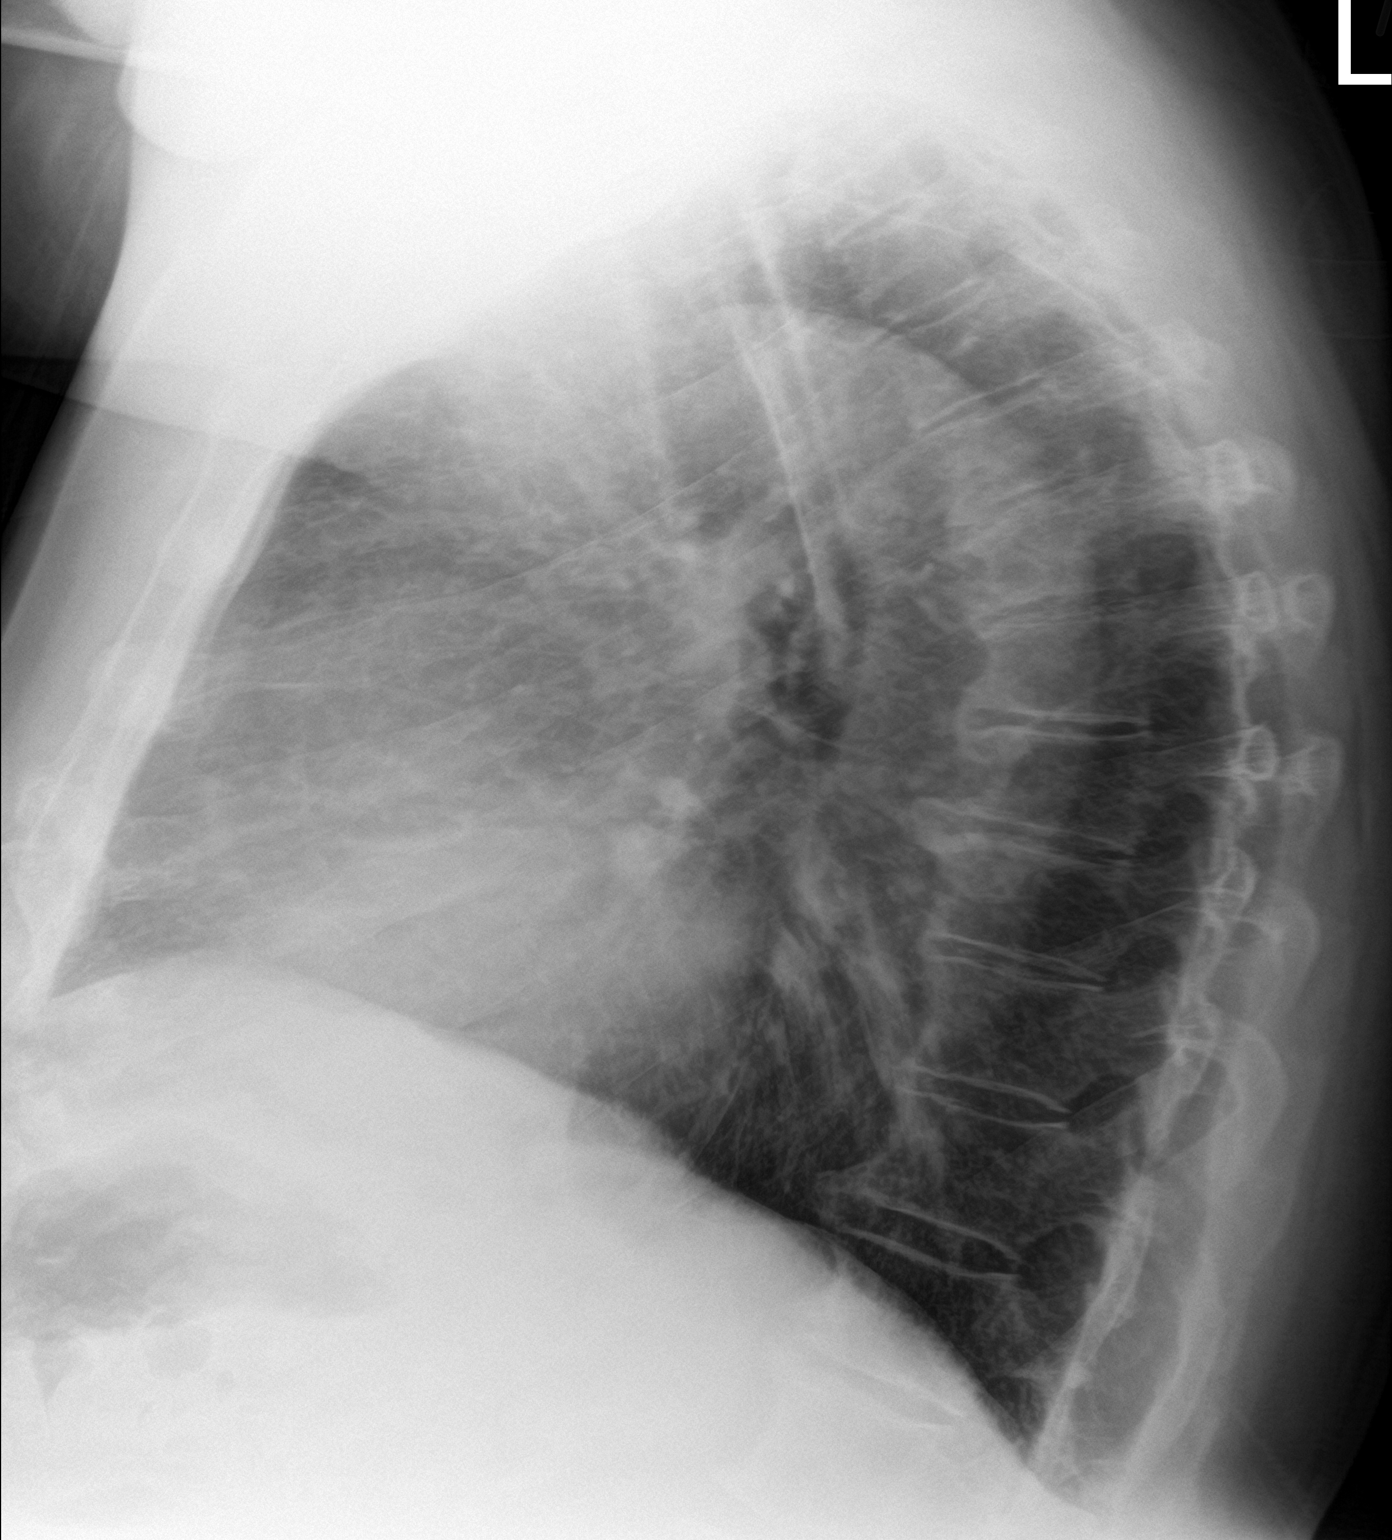

[2 of 2 positions shown; findings below may reference images not displayed]

FINDINGS: No active infiltrate or effusion is seen. The lungs are slightly
hyperaerated. Mediastinal hilar contours are unremarkable and mild
cardiomegaly is stable. There are degenerative changes diffusely
throughout the thoracic spine.
IMPRESSION: No active lung disease. No change in hyper aeration. Next item
stable mild cardiomegaly.

## 2015-08-27 MED ORDER — IPRATROPIUM-ALBUTEROL 0.5-2.5 (3) MG/3ML IN SOLN
3.0000 mL | Freq: Once | RESPIRATORY_TRACT | Status: AC
  Administered 2015-08-27: 3 mL via RESPIRATORY_TRACT

## 2015-08-27 MED ORDER — AZITHROMYCIN 250 MG PO TABS: ORAL_TABLET | ORAL | Status: DC

## 2015-08-27 MED ORDER — HYDROCODONE-HOMATROPINE 5-1.5 MG/5ML PO SYRP: 5.0000 mL | ORAL_SOLUTION | Freq: Three times a day (TID) | ORAL | Status: DC | PRN

## 2015-08-27 NOTE — Progress Notes (Signed)
Pre visit review using our clinic review tool, if applicable. No additional management support is needed unless otherwise documented below in the visit note. 

## 2015-08-27 NOTE — Patient Instructions (Signed)
It was great meeting you today!  I am sorry you are not back to normal just yet. I will follow up with you regarding your chest xray.

## 2015-08-27 NOTE — Progress Notes (Signed)
Subjective:    Patient ID: Clinton Black, male    DOB: Mar 05, 1946, 70 y.o.   MRN: [id number]  HPI  70 year old male who presents to the office today for follow up. He was seen on March 6th and diagnosed with Bronchitis,started on 40 mg prednisone taper and given Hycodan. He reports today that he is not feeling any better,after finishing the prednisone, he does not feel as though the prednisone helped at all. The hycodan worked well for him.   He continues to have a constant productive cough with SOB and wheezing.Denies any fevers.    Review of Systems  Constitutional: Positive for chills, activity change and fatigue. Negative for fever and diaphoresis.  HENT: Positive for congestion, postnasal drip, rhinorrhea and sinus pressure. Negative for sore throat, trouble swallowing and voice change.   Respiratory: Positive for cough, shortness of breath and wheezing. Negative for stridor.   Cardiovascular: Negative.   Skin: Negative.   Neurological: Negative.   Psychiatric/Behavioral: Positive for sleep disturbance.  All other systems reviewed and are negative.  Past Medical History  Diagnosis Date  . Arthritis     Social History   Social History  . Marital Status: Single    Spouse Name: N/A  . Number of Children: N/A  . Years of Education: N/A   Occupational History  . Not on file.   Social History Main Topics  . Smoking status: Former Smoker    Quit date: 06/15/1994  . Smokeless tobacco: Never Used  . Alcohol Use: 8.4 oz/week    0 Standard drinks or equivalent, 14 Shots of liquor per week     Comment: 2 drinks of liquor per day  . Drug Use: Yes     Comment: occasionally  . Sexual Activity:    Partners: Female    Museum/gallery curator: Condom     Comment: condom use most of the time but not always   Other Topics Concern  . Not on file   [place] - [employer] and [employer]. married '72- 3 years/divorced; married '89- 3 years/divorced;.  married '03 - 3 years/divorced. 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    Past Surgical History  Procedure Laterality Date  . Tonsillectomy    . Knee surgery  2000  . Cyst excision      polynomial  . Colonoscopy    . Tonsillectomy    . Widson teeth extraction      Family History  Problem Relation Age of Onset  . Aneurysm Father   . Heart disease Father   . Cancer Neg Hx   . Colon cancer Neg Hx   . Esophageal cancer Neg Hx   . Rectal cancer Neg Hx   . Stomach cancer Neg Hx     No Known Allergies  Current Outpatient Prescriptions on File Prior to Visit  Medication Sig Dispense Refill  . aspirin 81 MG tablet Take 81 mg by mouth daily.    . Avanafil (STENDRA) 100 MG TABS Take 100 mg by mouth daily as needed. 12 tablet 5  . Coenzyme Q10 (COQ10 MAXIMUM STRENGTH PO) Take 1 tablet by mouth daily. 250mg     . diclofenac (VOLTAREN) 75 MG EC tablet Take 75 mg by mouth 2 (two) times daily.    . finasteride (PROSCAR) 5 MG tablet Take 5 mg by mouth daily.    . fish oil-omega-3 fatty acids 1000 MG capsule Take 2 g by mouth daily.    Marland Kitchen  glucosamine-chondroitin 500-400 MG tablet Take 1 tablet by mouth daily.    . predniSONE (DELTASONE) 10 MG tablet Take 4 tablets by mouth once daily for 2 days, 3 tablets once daily for 2 days, 2 tablets once daily for 2 days, and one tablet for 2 days. 20 tablet 0  . triazolam (HALCION) 0.25 MG tablet TAKE 1 TABLET BY MOUTH AT BEDTIME AS NEEDED FOR SLEEP 30 tablet 2   No current facility-administered medications on file prior to visit.    BP 108/70 mmHg  Pulse 110  Temp(Src) 97.8 F (36.6 C) (Oral)  Ht 6\' 5"  (1.956 m)  Wt 305 lb 3.2 oz (138.438 kg)  BMI 36.18 kg/m2  SpO2 94%       Objective:   Physical Exam  Constitutional: He is oriented to person, place, and time. He appears well-developed and well-nourished. No distress.  Cardiovascular: Normal rate, regular rhythm and normal heart sounds.  Exam reveals no gallop and no friction rub.   No murmur  heard. Pulmonary/Chest: Effort normal. No respiratory distress. He has wheezes in the right upper field, the right middle field, the right lower field, the left upper field, the left middle field and the left lower field. He has rhonchi in the right upper field, the right middle field and the left middle field. He has no rales. He exhibits no tenderness.  Neurological: He is alert and oriented to person, place, and time.  Skin: Skin is warm and dry. No rash noted. He is not diaphoretic. No erythema. No pallor.  Psychiatric: He has a normal mood and affect. His behavior is normal. Judgment and thought content normal.  Vitals reviewed.     Assessment & Plan:  1. Cough - DG Chest 2 View; Future - HYDROcodone-homatropine (HYCODAN) 5-1.5 MG/5ML syrup; Take 5 mLs by mouth every 8 (eight) hours as needed for cough.  Dispense: 120 mL; Refill: 0 - ipratropium-albuterol (DUONEB) 0.5-2.5 (3) MG/3ML nebulizer solution 3 mL; Take 3 mLs by nebulization once. -  Improved breathing after  Nebulizer.  - Will follow up with after chest x ray

## 2015-09-09 ENCOUNTER — Telehealth: Payer: Self-pay | Admitting: Internal Medicine

## 2015-09-09 NOTE — Telephone Encounter (Signed)
Pt request [employer] referral for Dr. Velvet Bathe at [facility]. Please help, he has to get this first before he can make an appt.

## 2015-09-10 NOTE — Telephone Encounter (Signed)
Clinton Black [id number] valid 09/10/2015 - 03/08/2016 for 6 visits

## 2015-10-21 DIAGNOSIS — R972 Elevated prostate specific antigen [PSA]: Secondary | ICD-10-CM | POA: Diagnosis not present

## 2015-10-21 DIAGNOSIS — N401 Enlarged prostate with lower urinary tract symptoms: Secondary | ICD-10-CM | POA: Diagnosis not present

## 2015-10-21 LAB — PSA: PSA: 2.28

## 2015-10-22 LAB — PSA: PSA: 2.28

## 2015-10-24 DIAGNOSIS — N401 Enlarged prostate with lower urinary tract symptoms: Secondary | ICD-10-CM | POA: Diagnosis not present

## 2015-10-24 DIAGNOSIS — Z0000 Encounter for general adult medical examination without abnormal findings: Secondary | ICD-10-CM | POA: Diagnosis not present

## 2015-10-24 DIAGNOSIS — N138 Other obstructive and reflux uropathy: Secondary | ICD-10-CM | POA: Diagnosis not present

## 2015-10-24 DIAGNOSIS — R972 Elevated prostate specific antigen [PSA]: Secondary | ICD-10-CM | POA: Diagnosis not present

## 2015-10-24 DIAGNOSIS — N4340 Spermatocele of epididymis, unspecified: Secondary | ICD-10-CM | POA: Diagnosis not present

## 2015-12-12 DIAGNOSIS — M1711 Unilateral primary osteoarthritis, right knee: Secondary | ICD-10-CM | POA: Diagnosis not present

## 2016-01-09 DIAGNOSIS — R2689 Other abnormalities of gait and mobility: Secondary | ICD-10-CM | POA: Diagnosis not present

## 2016-01-09 DIAGNOSIS — M1711 Unilateral primary osteoarthritis, right knee: Secondary | ICD-10-CM | POA: Diagnosis not present

## 2016-01-09 DIAGNOSIS — R262 Difficulty in walking, not elsewhere classified: Secondary | ICD-10-CM | POA: Diagnosis not present

## 2016-01-09 DIAGNOSIS — M25561 Pain in right knee: Secondary | ICD-10-CM | POA: Diagnosis not present

## 2016-01-16 DIAGNOSIS — M1711 Unilateral primary osteoarthritis, right knee: Secondary | ICD-10-CM | POA: Diagnosis not present

## 2016-01-16 DIAGNOSIS — M25561 Pain in right knee: Secondary | ICD-10-CM | POA: Diagnosis not present

## 2016-01-23 DIAGNOSIS — M25561 Pain in right knee: Secondary | ICD-10-CM | POA: Diagnosis not present

## 2016-01-23 DIAGNOSIS — M1711 Unilateral primary osteoarthritis, right knee: Secondary | ICD-10-CM | POA: Diagnosis not present

## 2016-01-27 DIAGNOSIS — M25561 Pain in right knee: Secondary | ICD-10-CM | POA: Diagnosis not present

## 2016-01-27 DIAGNOSIS — M1711 Unilateral primary osteoarthritis, right knee: Secondary | ICD-10-CM | POA: Diagnosis not present

## 2016-02-06 DIAGNOSIS — M25561 Pain in right knee: Secondary | ICD-10-CM | POA: Diagnosis not present

## 2016-02-06 DIAGNOSIS — M1711 Unilateral primary osteoarthritis, right knee: Secondary | ICD-10-CM | POA: Diagnosis not present

## 2016-03-11 ENCOUNTER — Other Ambulatory Visit: Payer: Self-pay | Admitting: Internal Medicine

## 2016-03-11 NOTE — Telephone Encounter (Signed)
Needs OV.  

## 2016-03-12 NOTE — Telephone Encounter (Signed)
Signed Rx faxed to pharmacy

## 2016-03-12 NOTE — Telephone Encounter (Signed)
I called pt- no answer. Left mess for patient to call back to schedule OV with Dr. Alain Marion.

## 2016-04-16 LAB — HM DIABETES EYE EXAM

## 2016-04-24 ENCOUNTER — Telehealth: Payer: Self-pay | Admitting: Emergency Medicine

## 2016-04-24 NOTE — Telephone Encounter (Signed)
Pt would like to switch from Dr Camila Li to Dr Ronnald Ramp. Are you both ok with the switch?

## 2016-04-26 NOTE — Telephone Encounter (Signed)
Yes, ok with me 

## 2016-04-26 NOTE — Telephone Encounter (Signed)
OK w/me Thx 

## 2016-04-27 NOTE — Telephone Encounter (Signed)
Made appt for 05/11/16 at 1:30pm. Thanks.

## 2016-05-11 ENCOUNTER — Other Ambulatory Visit (INDEPENDENT_AMBULATORY_CARE_PROVIDER_SITE_OTHER): Payer: Commercial Managed Care - HMO

## 2016-05-11 ENCOUNTER — Encounter: Payer: Self-pay | Admitting: Internal Medicine

## 2016-05-11 ENCOUNTER — Ambulatory Visit (INDEPENDENT_AMBULATORY_CARE_PROVIDER_SITE_OTHER): Payer: Commercial Managed Care - HMO | Admitting: Internal Medicine

## 2016-05-11 VITALS — BP 130/78 | HR 71 | Temp 97.5°F | Resp 16 | Ht 77.0 in | Wt 315.2 lb

## 2016-05-11 DIAGNOSIS — R0609 Other forms of dyspnea: Secondary | ICD-10-CM

## 2016-05-11 DIAGNOSIS — Z1159 Encounter for screening for other viral diseases: Secondary | ICD-10-CM

## 2016-05-11 DIAGNOSIS — R7989 Other specified abnormal findings of blood chemistry: Secondary | ICD-10-CM | POA: Diagnosis not present

## 2016-05-11 DIAGNOSIS — R310 Gross hematuria: Secondary | ICD-10-CM | POA: Diagnosis not present

## 2016-05-11 DIAGNOSIS — H029 Unspecified disorder of eyelid: Secondary | ICD-10-CM

## 2016-05-11 DIAGNOSIS — E785 Hyperlipidemia, unspecified: Secondary | ICD-10-CM | POA: Insufficient documentation

## 2016-05-11 DIAGNOSIS — G4700 Insomnia, unspecified: Secondary | ICD-10-CM | POA: Diagnosis not present

## 2016-05-11 DIAGNOSIS — R972 Elevated prostate specific antigen [PSA]: Secondary | ICD-10-CM | POA: Diagnosis not present

## 2016-05-11 DIAGNOSIS — E118 Type 2 diabetes mellitus with unspecified complications: Secondary | ICD-10-CM

## 2016-05-11 DIAGNOSIS — G4730 Sleep apnea, unspecified: Secondary | ICD-10-CM

## 2016-05-11 DIAGNOSIS — R0683 Snoring: Secondary | ICD-10-CM | POA: Insufficient documentation

## 2016-05-11 DIAGNOSIS — R0600 Dyspnea, unspecified: Secondary | ICD-10-CM | POA: Insufficient documentation

## 2016-05-11 DIAGNOSIS — Z0000 Encounter for general adult medical examination without abnormal findings: Secondary | ICD-10-CM | POA: Diagnosis not present

## 2016-05-11 LAB — CBC WITH DIFFERENTIAL/PLATELET
Basophils Absolute: 0 10*3/uL (ref 0.0–0.1)
Basophils Relative: 0.5 % (ref 0.0–3.0)
Eosinophils Absolute: 0.6 10*3/uL (ref 0.0–0.7)
Eosinophils Relative: 7.2 % — ABNORMAL HIGH (ref 0.0–5.0)
HCT: 47.9 % (ref 39.0–52.0)
Hemoglobin: 16.1 g/dL (ref 13.0–17.0)
Lymphocytes Relative: 28.1 % (ref 12.0–46.0)
Lymphs Abs: 2.2 10*3/uL (ref 0.7–4.0)
MCHC: 33.6 g/dL (ref 30.0–36.0)
MCV: 87.5 fl (ref 78.0–100.0)
Monocytes Absolute: 0.5 10*3/uL (ref 0.1–1.0)
Monocytes Relative: 5.8 % (ref 3.0–12.0)
Neutro Abs: 4.6 10*3/uL (ref 1.4–7.7)
Neutrophils Relative %: 58.4 % (ref 43.0–77.0)
Platelets: 184 10*3/uL (ref 150.0–400.0)
RBC: 5.47 Mil/uL (ref 4.22–5.81)
RDW: 12.9 % (ref 11.5–15.5)
WBC: 7.8 10*3/uL (ref 4.0–10.5)

## 2016-05-11 LAB — LIPID PANEL
Cholesterol: 185 mg/dL (ref 0–200)
HDL: 41.2 mg/dL (ref >39.00)
NonHDL: 143.87
Total CHOL/HDL Ratio: 4
Triglycerides: 234 mg/dL — ABNORMAL HIGH (ref 0.0–149.0)
VLDL: 46.8 mg/dL — ABNORMAL HIGH (ref 0.0–40.0)

## 2016-05-11 LAB — URINALYSIS, ROUTINE W REFLEX MICROSCOPIC
Bilirubin Urine: NEGATIVE
Hgb urine dipstick: NEGATIVE
Ketones, ur: NEGATIVE
Leukocytes, UA: NEGATIVE
Nitrite: NEGATIVE
Specific Gravity, Urine: >=1.03 — AB (ref 1.000–1.030)
Total Protein, Urine: NEGATIVE
Urine Glucose: NEGATIVE
Urobilinogen, UA: 0.2 (ref 0.0–1.0)
pH: 5.5 (ref 5.0–8.0)

## 2016-05-11 LAB — COMPREHENSIVE METABOLIC PANEL
ALT: 46 U/L (ref 0–53)
AST: 25 U/L (ref 0–37)
Albumin: 4.6 g/dL (ref 3.5–5.2)
Alkaline Phosphatase: 97 U/L (ref 39–117)
BUN: 15 mg/dL (ref 6–23)
CO2: 30 mEq/L (ref 19–32)
Calcium: 9.6 mg/dL (ref 8.4–10.5)
Chloride: 103 mEq/L (ref 96–112)
Creatinine, Ser: 0.92 mg/dL (ref 0.40–1.50)
GFR: 86.72 mL/min (ref >60.00)
Glucose, Bld: 123 mg/dL — ABNORMAL HIGH (ref 70–99)
Potassium: 4.6 mEq/L (ref 3.5–5.1)
Sodium: 139 mEq/L (ref 135–145)
Total Bilirubin: 0.6 mg/dL (ref 0.2–1.2)
Total Protein: 7.3 g/dL (ref 6.0–8.3)

## 2016-05-11 LAB — HEMOGLOBIN A1C: Hgb A1c MFr Bld: 6.8 % — ABNORMAL HIGH (ref 4.6–6.5)

## 2016-05-11 LAB — LDL CHOLESTEROL, DIRECT: Direct LDL: 120 mg/dL

## 2016-05-11 MED ORDER — TRIAZOLAM 0.25 MG PO TABS: 0.2500 mg | ORAL_TABLET | Freq: Every evening | ORAL | 3 refills | Status: DC | PRN

## 2016-05-11 NOTE — Progress Notes (Signed)
Pre visit review using our clinic review tool, if applicable. No additional management support is needed unless otherwise documented below in the visit note. 

## 2016-05-11 NOTE — Progress Notes (Signed)
Subjective:  Patient ID: Clinton Black, male    DOB: Jan 13, 1946  Age: 70 y.o. MRN: [id number]  CC: Annual Exam; Hyperlipidemia; Diabetes; and Shortness of Breath  NEW TO ME  HPI Clinton Black presents for an AWV.CPX.  He complains of chronic dyspnea on exertion that has worsened some over the last year. He denies chest pain, diaphoresis, cough, wheezing, hemoptysis, palpitations, dizziness, or near-syncope.  He complains of a lesion over his left upper eyelid that he wants to have removed.  He complains of chronic insomnia with frequent awakenings and wants a refill on Halcion. He also has snores and is concerned that he may have sleep apnea.   Past Medical History:  Diagnosis Date  . Arthritis    Past Surgical History:  Procedure Laterality Date  . COLONOSCOPY    . CYST EXCISION     polynomial  . KNEE SURGERY  2000  . TONSILLECTOMY    . TONSILLECTOMY    . widson teeth extraction      reports that he quit smoking about 21 years ago. He has never used smokeless tobacco. He reports that he drinks about 8.4 oz of alcohol per week . He reports that he uses drugs. family history includes Aneurysm in his father; Heart disease in his father. No Known Allergies  Outpatient Medications Prior to Visit  Medication Sig Dispense Refill  . Avanafil (STENDRA) 100 MG TABS Take 100 mg by mouth daily as needed. 12 tablet 5  . finasteride (PROSCAR) 5 MG tablet Take 5 mg by mouth daily.    Marland Kitchen aspirin 81 MG tablet Take 81 mg by mouth daily.    Marland Kitchen azithromycin (ZITHROMAX) 250 MG tablet Take two pills on day 1 and then one pill on days 2-4. 6 each 0  . Coenzyme Q10 (COQ10 MAXIMUM STRENGTH PO) Take 1 tablet by mouth daily. 250mg     . diclofenac (VOLTAREN) 75 MG EC tablet Take 75 mg by mouth 2 (two) times daily.    . fish oil-omega-3 fatty acids 1000 MG capsule Take 2 g by mouth daily.    Marland Kitchen glucosamine-chondroitin 500-400 MG tablet Take 1 tablet by mouth daily.    Marland Kitchen HYDROcodone-homatropine  (HYCODAN) 5-1.5 MG/5ML syrup Take 5 mLs by mouth every 8 (eight) hours as needed for cough. 120 mL 0  . predniSONE (DELTASONE) 10 MG tablet Take 4 tablets by mouth once daily for 2 days, 3 tablets once daily for 2 days, 2 tablets once daily for 2 days, and one tablet for 2 days. 20 tablet 0  . triazolam (HALCION) 0.25 MG tablet TAKE 1 TABLET AT BEDTIME AS NEEDED 30 tablet 0   No facility-administered medications prior to visit.     ROS Review of Systems  Constitutional: Negative for activity change, appetite change, chills, diaphoresis, fatigue, fever and unexpected weight change.  HENT: Negative for trouble swallowing and voice change.   Eyes: Negative.  Negative for visual disturbance.  Respiratory: Positive for apnea and shortness of breath. Negative for cough, choking, chest tightness, wheezing and stridor.   Cardiovascular: Negative for chest pain, palpitations and leg swelling.  Gastrointestinal: Negative.  Negative for abdominal pain, constipation, diarrhea, nausea and vomiting.  Endocrine: Negative.  Negative for cold intolerance, heat intolerance, polydipsia, polyphagia and polyuria.  Genitourinary: Negative.  Negative for difficulty urinating.  Musculoskeletal: Negative.  Negative for arthralgias, back pain, myalgias and neck pain.  Skin: Negative.  Negative for color change and rash.  Allergic/Immunologic: Negative.   Neurological: Negative.  Negative for dizziness, syncope, weakness, light-headedness and numbness.  Hematological: Negative.  Negative for adenopathy. Does not bruise/bleed easily.  Psychiatric/Behavioral: Positive for sleep disturbance. Negative for agitation, dysphoric mood, self-injury and suicidal ideas. The patient is not nervous/anxious.     Objective:  BP 130/78 (BP Location: Left Arm, Patient Position: Sitting, Cuff Size: Large)   Pulse 71   Temp 97.5 F (36.4 C) (Oral)   Resp 16   Ht 6\' 5"  (1.956 m)   Wt (!) 315 lb 4 oz (143 kg)   SpO2 98%   BMI  37.38 kg/m   BP Readings from Last 3 Encounters:  05/11/16 130/78  08/27/15 108/70  08/19/15 140/80    Wt Readings from Last 3 Encounters:  05/11/16 (!) 315 lb 4 oz (143 kg)  08/27/15 (!) 305 lb 3.2 oz (138.4 kg)  08/19/15 (!) 306 lb 14.4 oz (139.2 kg)    Physical Exam  Constitutional: He is oriented to person, place, and time. No distress.  HENT:  Nose: Nose normal.  Mouth/Throat: Oropharynx is clear and moist. No oropharyngeal exudate.  Eyes: Conjunctivae are normal. Right eye exhibits no discharge. Left eye exhibits no discharge. No scleral icterus.    Neck: Normal range of motion. Neck supple. No JVD present. No tracheal deviation present. No thyromegaly present.  Cardiovascular: Normal rate, regular rhythm, normal heart sounds and intact distal pulses.  Exam reveals no gallop and no friction rub.   No murmur heard. EKG -----  Sinus  Rhythm  WITHIN NORMAL LIMITS   Pulmonary/Chest: Effort normal and breath sounds normal. No stridor. No respiratory distress. He has no wheezes. He has no rales. He exhibits no tenderness.  Abdominal: Soft. Bowel sounds are normal. He exhibits no distension and no mass. There is no tenderness. There is no rebound and no guarding.  Genitourinary:  Genitourinary Comments: GU and prostate exams were deferred at his request since he tells me he just saw his urologist 3 weeks ago  Musculoskeletal: Normal range of motion. He exhibits no edema, tenderness or deformity.  Lymphadenopathy:    He has no cervical adenopathy.  Neurological: He is oriented to person, place, and time.  Skin: Skin is warm. No rash noted. He is not diaphoretic. No erythema. No pallor.  Psychiatric: He has a normal mood and affect. His behavior is normal. Judgment and thought content normal.    Lab Results  Component Value Date   WBC 7.8 05/11/2016   HGB 16.1 05/11/2016   HCT 47.9 05/11/2016   PLT 184.0 05/11/2016   GLUCOSE 123 (H) 05/11/2016   CHOL 185 05/11/2016    TRIG 234.0 (H) 05/11/2016   HDL 41.20 05/11/2016   LDLDIRECT 120.0 05/11/2016   LDLCALC 114 (H) 10/06/2013   ALT 46 05/11/2016   AST 25 05/11/2016   NA 139 05/11/2016   K 4.6 05/11/2016   CL 103 05/11/2016   CREATININE 0.92 05/11/2016   BUN 15 05/11/2016   CO2 30 05/11/2016   TSH 4.21 05/11/2016   PSA 2.28 10/21/2015   HGBA1C 6.8 (H) 05/11/2016    Dg Chest 2 View  Result Date: 08/27/2015 CLINICAL DATA:  Cough, congestion, shortness of breath for 1 week EXAM: CHEST  2 VIEW COMPARISON:  Chest x-ray of 09/26/2012 FINDINGS: No active infiltrate or effusion is seen. The lungs are slightly hyperaerated. Mediastinal hilar contours are unremarkable and mild cardiomegaly is stable. There are degenerative changes diffusely throughout the thoracic spine. IMPRESSION: No active lung disease. No change in hyper aeration. Next  item stable mild cardiomegaly. Electronically Signed   By: Ivar Drape M.D.   On: 08/27/2015 17:09    Assessment & Plan:   Jaeveon was seen today for annual exam, hyperlipidemia, diabetes and shortness of breath.  Diagnoses and all orders for this visit:  Elevated PSA- he sees urology every 6 months about this.  Snoring- I'm concerned that he has sleep apnea so I referred him for sleep study -     Ambulatory referral to Sleep Studies  Insomnia w/ sleep apnea- will continue Halcion as needed -     triazolam (HALCION) 0.25 MG tablet; Take 1 tablet (0.25 mg total) by mouth at bedtime as needed.  Obesity, morbid ([place])- he agrees to work on his lifestyle modifications in order to lose weight. -     Hemoglobin A1c; Future  Routine general medical examination at a health care facility  DOE (dyspnea on exertion)- he has DOE but no other signs of angina, his EKG is normal, his labs are negative for any secondary causes of shortness of breath. I think the cause for his dyspnea on exertion is obesity and poor conditioning. Also think he would benefit from being evaluated for  sleep apnea. -     EKG 12-Lead -     CBC with Differential/Platelet; Future  Gross hematuria- this has resolved -     Urinalysis, Routine w reflex microscopic (not at [facility]); Future  Hyperlipidemia LDL goal <130- he has not achieved his LDL goal so I have asked him to start taking a statin and an aspirin for cardiovascular risk reduction -     Lipid panel; Future -     Comprehensive metabolic panel; Future -     Thyroid Panel With TSH; Future -     atorvastatin (LIPITOR) 20 MG tablet; Take 1 tablet (20 mg total) by mouth daily. -     aspirin 81 MG tablet; Take 1 tablet (81 mg total) by mouth daily.  Need for hepatitis C screening test -     Hepatitis C antibody; Future  Lesion of left eyelid -     Ambulatory referral to Ophthalmology  Type 2 diabetes mellitus with complication, without long-term current use of insulin ([place])- his A1c is at 6.8%. This is new onset type 2 diabetes mellitus. He does not require any medical therapy at this time but he does agree to work on his lifestyle modifications. -     atorvastatin (LIPITOR) 20 MG tablet; Take 1 tablet (20 mg total) by mouth daily. -     aspirin 81 MG tablet; Take 1 tablet (81 mg total) by mouth daily.   I have discontinued Mr. Zakowski fish oil-omega-3 fatty acids, glucosamine-chondroitin, Coenzyme Q10 (COQ10 MAXIMUM STRENGTH PO), diclofenac, predniSONE, HYDROcodone-homatropine, and azithromycin. I have also changed his triazolam and aspirin. Additionally, I am having him start on atorvastatin. Lastly, I am having him maintain his Avanafil and finasteride.  Meds ordered this encounter  Medications  . triazolam (HALCION) 0.25 MG tablet    Sig: Take 1 tablet (0.25 mg total) by mouth at bedtime as needed.    Dispense:  30 tablet    Refill:  3    This request is for a new prescription for a controlled substance as required by Federal/State law.  . atorvastatin (LIPITOR) 20 MG tablet    Sig: Take 1 tablet (20 mg total) by mouth daily.     Dispense:  90 tablet    Refill:  3  . aspirin 81 MG  tablet    Sig: Take 1 tablet (81 mg total) by mouth daily.    Dispense:  90 tablet    Refill:  3    See AVS for instructions about healthy living and anticipatory guidance.   Follow-up: Return in about 3 months (around 08/11/2016).  Scarlette Calico, MD

## 2016-05-11 NOTE — Patient Instructions (Signed)

## 2016-05-12 ENCOUNTER — Encounter: Payer: Self-pay | Admitting: Internal Medicine

## 2016-05-12 LAB — THYROID PANEL WITH TSH
Free Thyroxine Index: 1.5 (ref 1.4–3.8)
T3 Uptake: 31 % (ref 22–35)
T4, Total: 4.9 ug/dL (ref 4.5–12.0)
TSH: 4.21 mIU/L (ref 0.40–4.50)

## 2016-05-12 LAB — HEPATITIS C ANTIBODY: HCV Ab: NEGATIVE

## 2016-05-12 MED ORDER — ASPIRIN 81 MG PO TABS: 81.0000 mg | ORAL_TABLET | Freq: Every day | ORAL | 3 refills | Status: DC

## 2016-05-12 MED ORDER — ATORVASTATIN CALCIUM 20 MG PO TABS: 20.0000 mg | ORAL_TABLET | Freq: Every day | ORAL | 3 refills | Status: DC

## 2016-06-29 DIAGNOSIS — D2312 Other benign neoplasm of skin of left eyelid, including canthus: Secondary | ICD-10-CM | POA: Diagnosis not present

## 2016-07-20 ENCOUNTER — Other Ambulatory Visit: Payer: Self-pay | Admitting: Ophthalmology

## 2016-07-20 DIAGNOSIS — D2312 Other benign neoplasm of skin of left eyelid, including canthus: Secondary | ICD-10-CM | POA: Diagnosis not present

## 2016-07-20 DIAGNOSIS — L919 Hypertrophic disorder of the skin, unspecified: Secondary | ICD-10-CM | POA: Diagnosis not present

## 2016-11-06 DIAGNOSIS — R972 Elevated prostate specific antigen [PSA]: Secondary | ICD-10-CM | POA: Diagnosis not present

## 2016-11-06 DIAGNOSIS — N401 Enlarged prostate with lower urinary tract symptoms: Secondary | ICD-10-CM | POA: Diagnosis not present

## 2016-11-06 DIAGNOSIS — N529 Male erectile dysfunction, unspecified: Secondary | ICD-10-CM | POA: Diagnosis not present

## 2016-11-06 DIAGNOSIS — R351 Nocturia: Secondary | ICD-10-CM | POA: Diagnosis not present

## 2016-11-16 ENCOUNTER — Encounter: Payer: Self-pay | Admitting: Internal Medicine

## 2016-11-30 ENCOUNTER — Other Ambulatory Visit: Payer: Self-pay | Admitting: Internal Medicine

## 2016-11-30 DIAGNOSIS — G4730 Sleep apnea, unspecified: Principal | ICD-10-CM

## 2016-11-30 DIAGNOSIS — G4700 Insomnia, unspecified: Secondary | ICD-10-CM

## 2016-12-10 ENCOUNTER — Telehealth: Payer: Self-pay

## 2016-12-10 DIAGNOSIS — E785 Hyperlipidemia, unspecified: Secondary | ICD-10-CM

## 2016-12-10 DIAGNOSIS — E118 Type 2 diabetes mellitus with unspecified complications: Secondary | ICD-10-CM

## 2016-12-10 NOTE — Telephone Encounter (Signed)
error 

## 2016-12-18 ENCOUNTER — Telehealth: Payer: Self-pay

## 2016-12-18 DIAGNOSIS — E785 Hyperlipidemia, unspecified: Secondary | ICD-10-CM

## 2016-12-18 DIAGNOSIS — E118 Type 2 diabetes mellitus with unspecified complications: Secondary | ICD-10-CM

## 2016-12-18 MED ORDER — ATORVASTATIN CALCIUM 20 MG PO TABS: 20.0000 mg | ORAL_TABLET | Freq: Every day | ORAL | 0 refills | Status: DC

## 2016-12-18 NOTE — Telephone Encounter (Signed)
Pt scheduled an appointment for Thursday 12/24/2016 with Dr Ronnald Ramp.

## 2016-12-18 NOTE — Telephone Encounter (Signed)
erx sent to [facility].  

## 2016-12-18 NOTE — Telephone Encounter (Signed)
Pt needs a follow up appt for any refills. Can you call and schedule?

## 2016-12-24 ENCOUNTER — Other Ambulatory Visit (INDEPENDENT_AMBULATORY_CARE_PROVIDER_SITE_OTHER): Payer: Medicare HMO

## 2016-12-24 ENCOUNTER — Encounter: Payer: Self-pay | Admitting: Internal Medicine

## 2016-12-24 ENCOUNTER — Ambulatory Visit (INDEPENDENT_AMBULATORY_CARE_PROVIDER_SITE_OTHER): Payer: Medicare HMO | Admitting: Internal Medicine

## 2016-12-24 VITALS — BP 170/80 | HR 90 | Temp 98.0°F | Resp 16 | Ht 77.0 in | Wt 298.0 lb

## 2016-12-24 DIAGNOSIS — E118 Type 2 diabetes mellitus with unspecified complications: Secondary | ICD-10-CM | POA: Diagnosis not present

## 2016-12-24 DIAGNOSIS — Z79899 Other long term (current) drug therapy: Secondary | ICD-10-CM | POA: Diagnosis not present

## 2016-12-24 DIAGNOSIS — F1210 Cannabis abuse, uncomplicated: Secondary | ICD-10-CM

## 2016-12-24 DIAGNOSIS — I10 Essential (primary) hypertension: Secondary | ICD-10-CM

## 2016-12-24 DIAGNOSIS — R310 Gross hematuria: Secondary | ICD-10-CM

## 2016-12-24 DIAGNOSIS — R0683 Snoring: Secondary | ICD-10-CM

## 2016-12-24 DIAGNOSIS — R9431 Abnormal electrocardiogram [ECG] [EKG]: Secondary | ICD-10-CM

## 2016-12-24 DIAGNOSIS — G4730 Sleep apnea, unspecified: Secondary | ICD-10-CM | POA: Diagnosis not present

## 2016-12-24 DIAGNOSIS — G4700 Insomnia, unspecified: Secondary | ICD-10-CM

## 2016-12-24 DIAGNOSIS — R0600 Dyspnea, unspecified: Secondary | ICD-10-CM

## 2016-12-24 DIAGNOSIS — R0609 Other forms of dyspnea: Secondary | ICD-10-CM

## 2016-12-24 LAB — THYROID PANEL WITH TSH
Free Thyroxine Index: 1.6 (ref 1.4–3.8)
T3 Uptake: 32 % (ref 22–35)
T4, Total: 5 ug/dL (ref 4.5–12.0)
TSH: 2.72 mIU/L (ref 0.40–4.50)

## 2016-12-24 LAB — HEMOGLOBIN A1C: Hgb A1c MFr Bld: 6.8 % — ABNORMAL HIGH (ref 4.6–6.5)

## 2016-12-24 LAB — CBC WITH DIFFERENTIAL/PLATELET
Basophils Absolute: 0.1 10*3/uL (ref 0.0–0.1)
Basophils Relative: 0.8 % (ref 0.0–3.0)
Eosinophils Absolute: 0.4 10*3/uL (ref 0.0–0.7)
Eosinophils Relative: 4.7 % (ref 0.0–5.0)
HCT: 43.4 % (ref 39.0–52.0)
Hemoglobin: 14.6 g/dL (ref 13.0–17.0)
Lymphocytes Relative: 22.8 % (ref 12.0–46.0)
Lymphs Abs: 1.7 10*3/uL (ref 0.7–4.0)
MCHC: 33.6 g/dL (ref 30.0–36.0)
MCV: 88.4 fl (ref 78.0–100.0)
Monocytes Absolute: 0.6 10*3/uL (ref 0.1–1.0)
Monocytes Relative: 8.4 % (ref 3.0–12.0)
Neutro Abs: 4.8 10*3/uL (ref 1.4–7.7)
Neutrophils Relative %: 63.3 % (ref 43.0–77.0)
Platelets: 171 10*3/uL (ref 150.0–400.0)
RBC: 4.91 Mil/uL (ref 4.22–5.81)
RDW: 13.3 % (ref 11.5–15.5)
WBC: 7.6 10*3/uL (ref 4.0–10.5)

## 2016-12-24 LAB — TROPONIN I: TNIDX: 0.03 ug/l (ref 0.00–0.06)

## 2016-12-24 LAB — BRAIN NATRIURETIC PEPTIDE: Pro B Natriuretic peptide (BNP): 130 pg/mL — ABNORMAL HIGH (ref 0.0–100.0)

## 2016-12-24 MED ORDER — TRIAZOLAM 0.25 MG PO TABS: 0.2500 mg | ORAL_TABLET | Freq: Every evening | ORAL | 2 refills | Status: DC | PRN

## 2016-12-24 MED ORDER — AZILSARTAN MEDOXOMIL 40 MG PO TABS: 1.0000 | ORAL_TABLET | Freq: Every day | ORAL | 0 refills | Status: DC

## 2016-12-24 NOTE — Progress Notes (Signed)
Subjective:  Patient ID: Clinton Black, male    DOB: 1945/07/20  Age: 71 y.o. MRN: [id number]  CC: Hypertension and Diabetes   HPI YOVANI COGBURN presents for f/up - He had a few episodes of DOE a couple months ago so he stopped smoking marijuana and he says the symptoms resolve. He complains of chronic insomnia and snoring. He thinks his blood sugars are well-controlled. He denies any recent episodes of CP, palpitations, edema, or fatigue.  Outpatient Medications Prior to Visit  Medication Sig Dispense Refill  . aspirin 81 MG tablet Take 1 tablet (81 mg total) by mouth daily. 90 tablet 3  . atorvastatin (LIPITOR) 20 MG tablet Take 1 tablet (20 mg total) by mouth daily. 90 tablet 0  . Avanafil (STENDRA) 100 MG TABS Take 100 mg by mouth daily as needed. 12 tablet 5  . finasteride (PROSCAR) 5 MG tablet Take 5 mg by mouth daily.    . triazolam (HALCION) 0.25 MG tablet TAKE 1 TABLET AT BEDTIME AS NEEDED 30 tablet 0   No facility-administered medications prior to visit.     ROS Review of Systems  Constitutional: Negative for activity change, appetite change, diaphoresis, fatigue and unexpected weight change.  HENT: Negative.  Negative for trouble swallowing.   Eyes: Negative for visual disturbance.  Respiratory: Positive for apnea. Negative for cough, chest tightness, shortness of breath and wheezing.   Cardiovascular: Negative for chest pain, palpitations and leg swelling.  Gastrointestinal: Negative for abdominal pain, constipation, diarrhea, nausea and vomiting.  Endocrine: Negative for cold intolerance, heat intolerance, polydipsia, polyphagia and polyuria.  Genitourinary: Negative.  Negative for difficulty urinating, dysuria and hematuria.  Musculoskeletal: Negative.  Negative for back pain and myalgias.  Skin: Negative.   Allergic/Immunologic: Negative.   Neurological: Negative.  Negative for dizziness and weakness.  Hematological: Negative for adenopathy. Does not  bruise/bleed easily.  Psychiatric/Behavioral: Positive for sleep disturbance. Negative for decreased concentration, dysphoric mood and suicidal ideas. The patient is not nervous/anxious.     Objective:  BP (!) 170/80 (BP Location: Left Arm, Patient Position: Sitting, Cuff Size: Large)   Pulse 90   Temp 98 F (36.7 C) (Oral)   Resp 16   Ht 6\' 5"  (1.956 m)   Wt 298 lb (135.2 kg)   SpO2 98%   BMI 35.34 kg/m   BP Readings from Last 3 Encounters:  12/24/16 (!) 170/80  05/11/16 130/78  08/27/15 108/70    Wt Readings from Last 3 Encounters:  12/24/16 298 lb (135.2 kg)  05/11/16 (!) 315 lb 4 oz (143 kg)  08/27/15 (!) 305 lb 3.2 oz (138.4 kg)    Physical Exam  Constitutional: He is oriented to person, place, and time. No distress.  HENT:  Mouth/Throat: Oropharynx is clear and moist. No oropharyngeal exudate.  Eyes: Conjunctivae are normal. Right eye exhibits no discharge. Left eye exhibits no discharge. No scleral icterus.  Neck: Normal range of motion. Neck supple. No JVD present. No thyromegaly present.  Cardiovascular: Normal rate, regular rhythm, S1 normal, S2 normal and intact distal pulses.   Occasional extrasystoles are present. PMI is not displaced.  Exam reveals no gallop.   No murmur heard. EKG -  Sinus  Rhythm  -with ectopic ventricular couplets  -Nonspecific ST depression   +   Nonspecific T-abnormality  -Nondiagnostic.   ABNORMAL - there are no old EKG's for comparision  Pulmonary/Chest: Effort normal and breath sounds normal. No respiratory distress. He has no wheezes. He has no rales.  He exhibits no tenderness.  Abdominal: Soft. Bowel sounds are normal. He exhibits no distension and no mass. There is no tenderness. There is no rebound and no guarding.  Musculoskeletal: Normal range of motion. He exhibits no edema, tenderness or deformity.  Lymphadenopathy:    He has no cervical adenopathy.  Neurological: He is alert and oriented to person, place, and time.  Skin:  Skin is warm and dry. No rash noted. He is not diaphoretic. No erythema. No pallor.  Psychiatric: He has a normal mood and affect. His behavior is normal. Judgment and thought content normal.  Vitals reviewed.   Lab Results  Component Value Date   WBC 7.6 12/24/2016   HGB 14.6 12/24/2016   HCT 43.4 12/24/2016   PLT 171.0 12/24/2016   GLUCOSE 121 (H) 12/24/2016   CHOL 185 05/11/2016   TRIG 234.0 (H) 05/11/2016   HDL 41.20 05/11/2016   LDLDIRECT 120.0 05/11/2016   LDLCALC 114 (H) 10/06/2013   ALT 27 12/24/2016   AST 23 12/24/2016   NA 141 12/24/2016   K 4.4 12/24/2016   CL 105 12/24/2016   CREATININE 0.92 12/24/2016   BUN 17 12/24/2016   CO2 25 12/24/2016   TSH 2.72 12/24/2016   PSA 2.28 10/22/2015   HGBA1C 6.8 (H) 12/24/2016    Dg Chest 2 View  Result Date: 08/27/2015 CLINICAL DATA:  Cough, congestion, shortness of breath for 1 week EXAM: CHEST  2 VIEW COMPARISON:  Chest x-ray of 09/26/2012 FINDINGS: No active infiltrate or effusion is seen. The lungs are slightly hyperaerated. Mediastinal hilar contours are unremarkable and mild cardiomegaly is stable. There are degenerative changes diffusely throughout the thoracic spine. IMPRESSION: No active lung disease. No change in hyper aeration. Next item stable mild cardiomegaly. Electronically Signed   By: Ivar Drape M.D.   On: 08/27/2015 17:09    Assessment & Plan:   Gedalya was seen today for hypertension and diabetes.  Diagnoses and all orders for this visit:  Type 2 diabetes mellitus with complication, without long-term current use of insulin ([place])- His A1c stands at 6.8%. His blood sugars are adequately well controlled. He agrees to continue working on his lifestyle modifications. -     Comprehensive metabolic panel; Future -     Hemoglobin A1c; Future -     Microalbumin / creatinine urine ratio; Future -     Azilsartan Medoxomil (EDARBI) 40 MG TABS; Take 1 tablet by mouth daily.  Gross hematuria- will recheck his UA  today to be certain that this has resolved. -     Urinalysis, Routine w reflex microscopic; Future  Obesity, morbid ([place])- he agrees to work on his lifestyle modifications to lose weight.  Essential hypertension- his blood pressure is not adequately well controlled. I've asked him to start taking an ARB. His labs are negative for any evidence of secondary causes or end organ damage. -     Comprehensive metabolic panel; Future -     CBC with Differential/Platelet; Future -     Thyroid Panel With TSH; Future -     EKG 12-Lead -     Azilsartan Medoxomil (EDARBI) 40 MG TABS; Take 1 tablet by mouth daily.  Snoring -     Ambulatory referral to Sleep Studies  Insomnia w/ sleep apnea -     triazolam (HALCION) 0.25 MG tablet; Take 1 tablet (0.25 mg total) by mouth at bedtime as needed.  DOE (dyspnea on exertion)- this occurred several months ago and has not resolved. He  has a mildly abnormal EKG and risk factors for CAD so I have asked him to follow-up with cardiology. His troponin and BNP today are reassuring that there is no concern for ischemia or fluid overload at this time. -     Ambulatory referral to Cardiology -     Brain natriuretic peptide; Future -     Troponin I; Future  EKG abnormalities- as above. -     Ambulatory referral to Cardiology -     Brain natriuretic peptide; Future -     Troponin I; Future   I have changed Mr. Kisling triazolam. I am also having him start on Azilsartan Medoxomil. Additionally, I am having him maintain his Avanafil, finasteride, aspirin, and atorvastatin.  Meds ordered this encounter  Medications  . triazolam (HALCION) 0.25 MG tablet    Sig: Take 1 tablet (0.25 mg total) by mouth at bedtime as needed.    Dispense:  30 tablet    Refill:  2    This request is for a new prescription for a controlled substance as required by Federal/State law.  . Azilsartan Medoxomil (EDARBI) 40 MG TABS    Sig: Take 1 tablet by mouth daily.    Dispense:  49 tablet     Refill:  0     Follow-up: Return in about 6 weeks (around 02/04/2017).  Scarlette Calico, MD

## 2016-12-24 NOTE — Patient Instructions (Signed)

## 2016-12-25 LAB — COMPREHENSIVE METABOLIC PANEL
ALT: 27 U/L (ref 0–53)
AST: 23 U/L (ref 0–37)
Albumin: 4.4 g/dL (ref 3.5–5.2)
Alkaline Phosphatase: 86 U/L (ref 39–117)
BUN: 17 mg/dL (ref 6–23)
CO2: 25 mEq/L (ref 19–32)
Calcium: 9.7 mg/dL (ref 8.4–10.5)
Chloride: 105 mEq/L (ref 96–112)
Creatinine, Ser: 0.92 mg/dL (ref 0.40–1.50)
GFR: 86.56 mL/min (ref >60.00)
Glucose, Bld: 121 mg/dL — ABNORMAL HIGH (ref 70–99)
Potassium: 4.4 mEq/L (ref 3.5–5.1)
Sodium: 141 mEq/L (ref 135–145)
Total Bilirubin: 0.9 mg/dL (ref 0.2–1.2)
Total Protein: 6.9 g/dL (ref 6.0–8.3)

## 2016-12-26 ENCOUNTER — Encounter: Payer: Self-pay | Admitting: Internal Medicine

## 2016-12-30 ENCOUNTER — Encounter: Payer: Self-pay | Admitting: Internal Medicine

## 2016-12-30 DIAGNOSIS — F1210 Cannabis abuse, uncomplicated: Secondary | ICD-10-CM | POA: Insufficient documentation

## 2017-01-04 ENCOUNTER — Other Ambulatory Visit (INDEPENDENT_AMBULATORY_CARE_PROVIDER_SITE_OTHER): Payer: Medicare HMO

## 2017-01-04 ENCOUNTER — Telehealth: Payer: Self-pay

## 2017-01-04 DIAGNOSIS — I10 Essential (primary) hypertension: Secondary | ICD-10-CM | POA: Diagnosis not present

## 2017-01-04 DIAGNOSIS — E118 Type 2 diabetes mellitus with unspecified complications: Secondary | ICD-10-CM

## 2017-01-04 LAB — URINALYSIS, ROUTINE W REFLEX MICROSCOPIC
Bilirubin Urine: NEGATIVE
Hgb urine dipstick: NEGATIVE
Ketones, ur: NEGATIVE
Leukocytes, UA: NEGATIVE
Nitrite: NEGATIVE
Specific Gravity, Urine: >=1.03 — AB (ref 1.000–1.030)
Total Protein, Urine: NEGATIVE
Urine Glucose: NEGATIVE
Urobilinogen, UA: 0.2 (ref 0.0–1.0)
pH: 5 (ref 5.0–8.0)

## 2017-01-04 LAB — MICROALBUMIN / CREATININE URINE RATIO
Creatinine,U: 224.7 mg/dL
Microalb Creat Ratio: 0.6 mg/g (ref 0.0–30.0)
Microalb, Ur: 1.3 mg/dL (ref 0.0–1.9)

## 2017-01-05 NOTE — Telephone Encounter (Signed)
error 

## 2017-01-30 ENCOUNTER — Encounter: Payer: Self-pay | Admitting: Cardiology

## 2017-01-30 NOTE — Progress Notes (Signed)
Cardiology Office Note   Date:  02/01/2017   ID:  [name], [name] 1946-02-24, MRN [id number]  PCP:  [name], MD  Cardiologist:   [name], MD   Chief Complaint  Patient presents with  . Shortness of Breath  . Abnormal ECG      History of Present Illness: Clinton Black is a 71 y.o. male who is seen at the request of Dr. Ronnald Ramp for evaluation of DOE and abnormal Ecg. He has a history of DM type 2, morbid obesity, HLD, and HTN.   He report that earlier this year he was experiencing dyspnea, often awakening him at night. He states that since he stopped smoking marijuana this has largely resolved. Over the past 2 months he has been under increased stress with transition to retirement and moving to a new home. He was seen by Dr. Ronnald Ramp and noted to have frequent ectopy. Ecg showed PVC couplets. He was hypertensive and started on Edarbi. Notes increased dizziness since then. Denies any chest pain or syncope. No edema. Quit smoking in 1996. Has lost 25-30 lbs this year. He does have a family history of CAD. No prior cardiac work up.   Past Medical History:  Diagnosis Date  . Arthritis   . BPH (benign prostatic hyperplasia)   . Diabetes mellitus type 2 in obese ([place])   . HTN (hypertension)   . Hyperlipidemia, mixed   . Insomnia   . Obesity     Past Surgical History:  Procedure Laterality Date  . COLONOSCOPY    . CYST EXCISION     polynomial  . KNEE SURGERY  2000  . TONSILLECTOMY    . TONSILLECTOMY    . widson teeth extraction       Current Outpatient Prescriptions  Medication Sig Dispense Refill  . aspirin 81 MG tablet Take 1 tablet (81 mg total) by mouth daily. 90 tablet 3  . atorvastatin (LIPITOR) 20 MG tablet Take 1 tablet (20 mg total) by mouth daily. 90 tablet 0  . Azilsartan Medoxomil (EDARBI) 40 MG TABS Take 1 tablet by mouth daily. 49 tablet 0  . finasteride (PROSCAR) 5 MG tablet Take 5 mg by mouth daily.    . triazolam (HALCION) 0.25 MG tablet  Take 1 tablet (0.25 mg total) by mouth at bedtime as needed. 30 tablet 2   No current facility-administered medications for this visit.     Allergies:   Patient has no known allergies.    Social History:  The patient  reports that he quit smoking about 22 years ago. He has never used smokeless tobacco. He reports that he drinks about 8.4 oz of alcohol per week . He reports that he uses drugs.   Family History:  The patient's family history includes Aneurysm in his father; Heart disease in his father.    ROS:  Please see the history of present illness.   Otherwise, review of systems are positive for none.   All other systems are reviewed and negative.    PHYSICAL EXAM: VS:  BP (!) 142/80   Pulse 99   Ht 6\' 4"  (1.93 m)   Wt 290 lb 3.2 oz (131.6 kg)   SpO2 96%   BMI 35.32 kg/m  , BMI Body mass index is 35.32 kg/m. GEN: Well nourished, well developed, in no acute distress  HEENT: normal  Neck: no JVD, carotid bruits, or masses Cardiac: RRR with frequent extrasystoles; no murmurs, rubs, or gallops,no edema  Respiratory:  clear to auscultation bilaterally, normal work of breathing GI: soft, nontender, nondistended, + BS MS: no deformity or atrophy  Skin: warm and dry, patch of scaly eczema left shin Neuro:  Strength and sensation are intact Psych: euthymic mood, full affect   EKG:  EKG is not ordered today.  Ecg done 12/24/16 showed NSR with PVC couplet. Nonspecific ST - T changes. New since November 2017. I have personally reviewed and interpreted this study.   Recent Labs: 12/24/2016: ALT 27; BUN 17; Creatinine, Ser 0.92; Hemoglobin 14.6; Platelets 171.0; Potassium 4.4; Pro B Natriuretic peptide (BNP) 130.0; Sodium 141; TSH 2.72    Lipid Panel    Component Value Date/Time   CHOL 185 05/11/2016 1433   TRIG 234.0 (H) 05/11/2016 1433   HDL 41.20 05/11/2016 1433   CHOLHDL 4 05/11/2016 1433   VLDL 46.8 (H) 05/11/2016 1433   LDLCALC 114 (H) 10/06/2013 1123   LDLDIRECT 120.0  05/11/2016 1433      Wt Readings from Last 3 Encounters:  02/01/17 290 lb 3.2 oz (131.6 kg)  12/24/16 298 lb (135.2 kg)  05/11/16 (!) 315 lb 4 oz (143 kg)      Other studies Reviewed: Additional studies/ records that were reviewed today include: none  ASSESSMENT AND PLAN: 1. Dyspnea- no evidence of CHF. BNP normal. May have component of sleep apnea. Home sleep study planned. He does have frequent PVCs which may be symptomatic or may be a marker of underlying cardiac disease. He does have multiple cardiac risk factors including HTN, HLD, DM, and family history. Will schedule for a Lexiscan Myoview to rule out ischemia and an Echocardiogram to evaluate cardiac function. 2. Frequent PVCs. See above 3. HTN. Improved control on Edarbi. 4. DM type 2 5. HLD   Current medicines are reviewed at length with the patient today.  The patient does not have concerns regarding medicines.  The following changes have been made:  no change  Labs/ tests ordered today include: Lexiscan myoview and Echocardiogram No orders of the defined types were placed in this encounter.    Disposition:   FU with me TBD   Signed, [name], MD ,[facility] 02/01/2017 2:41 PM    [facility] [address] [place], [place], [address] Phone [phone], Fax [phone]

## 2017-02-01 ENCOUNTER — Ambulatory Visit (INDEPENDENT_AMBULATORY_CARE_PROVIDER_SITE_OTHER): Payer: Medicare HMO | Admitting: Cardiology

## 2017-02-01 ENCOUNTER — Encounter: Payer: Self-pay | Admitting: Cardiology

## 2017-02-01 VITALS — BP 142/80 | HR 99 | Ht 76.0 in | Wt 290.2 lb

## 2017-02-01 DIAGNOSIS — E118 Type 2 diabetes mellitus with unspecified complications: Secondary | ICD-10-CM

## 2017-02-01 DIAGNOSIS — R0602 Shortness of breath: Secondary | ICD-10-CM

## 2017-02-01 DIAGNOSIS — I10 Essential (primary) hypertension: Secondary | ICD-10-CM

## 2017-02-01 DIAGNOSIS — I493 Ventricular premature depolarization: Secondary | ICD-10-CM

## 2017-02-01 NOTE — Patient Instructions (Signed)
We will schedule you for an Echocardiogram and a Nuclear stress test

## 2017-02-05 ENCOUNTER — Other Ambulatory Visit: Payer: Self-pay

## 2017-02-05 ENCOUNTER — Ambulatory Visit (HOSPITAL_COMMUNITY): Payer: Medicare HMO | Attending: Cardiovascular Disease

## 2017-02-05 DIAGNOSIS — E119 Type 2 diabetes mellitus without complications: Secondary | ICD-10-CM | POA: Diagnosis not present

## 2017-02-05 DIAGNOSIS — E118 Type 2 diabetes mellitus with unspecified complications: Secondary | ICD-10-CM | POA: Insufficient documentation

## 2017-02-05 DIAGNOSIS — I10 Essential (primary) hypertension: Secondary | ICD-10-CM | POA: Diagnosis not present

## 2017-02-05 DIAGNOSIS — R0602 Shortness of breath: Secondary | ICD-10-CM | POA: Insufficient documentation

## 2017-02-05 DIAGNOSIS — I493 Ventricular premature depolarization: Secondary | ICD-10-CM | POA: Insufficient documentation

## 2017-02-05 DIAGNOSIS — Z87891 Personal history of nicotine dependence: Secondary | ICD-10-CM | POA: Insufficient documentation

## 2017-02-05 DIAGNOSIS — E785 Hyperlipidemia, unspecified: Secondary | ICD-10-CM | POA: Diagnosis not present

## 2017-02-05 DIAGNOSIS — I351 Nonrheumatic aortic (valve) insufficiency: Secondary | ICD-10-CM | POA: Diagnosis not present

## 2017-02-05 DIAGNOSIS — R0600 Dyspnea, unspecified: Secondary | ICD-10-CM | POA: Insufficient documentation

## 2017-02-05 NOTE — Progress Notes (Unsigned)
Clinton Black presented for an echocardiogram this afternoon. 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 DOD (Dr. Irish Lack) was notified and he advised the patient can go home since he had no symptoms.  Wyatt Mage, RDCS

## 2017-02-09 ENCOUNTER — Telehealth (HOSPITAL_COMMUNITY): Payer: Self-pay

## 2017-02-09 NOTE — Telephone Encounter (Signed)
Encounter complete. 

## 2017-02-11 ENCOUNTER — Ambulatory Visit (HOSPITAL_COMMUNITY)
Admission: RE | Admit: 2017-02-11 | Discharge: 2017-02-11 | Disposition: A | Discharge status: Home / Self Care | Payer: Medicare HMO | Source: Ambulatory Visit | Attending: Cardiology | Admitting: Cardiology

## 2017-02-11 DIAGNOSIS — R0602 Shortness of breath: Secondary | ICD-10-CM

## 2017-02-11 DIAGNOSIS — I501 Left ventricular failure: Secondary | ICD-10-CM | POA: Diagnosis not present

## 2017-02-11 DIAGNOSIS — I719 Aortic aneurysm of unspecified site, without rupture: Secondary | ICD-10-CM | POA: Insufficient documentation

## 2017-02-11 DIAGNOSIS — I10 Essential (primary) hypertension: Secondary | ICD-10-CM

## 2017-02-11 DIAGNOSIS — E118 Type 2 diabetes mellitus with unspecified complications: Secondary | ICD-10-CM

## 2017-02-11 DIAGNOSIS — I510 Cardiac septal defect, acquired: Secondary | ICD-10-CM | POA: Insufficient documentation

## 2017-02-11 DIAGNOSIS — I493 Ventricular premature depolarization: Secondary | ICD-10-CM

## 2017-02-11 DIAGNOSIS — R9439 Abnormal result of other cardiovascular function study: Secondary | ICD-10-CM | POA: Diagnosis not present

## 2017-02-11 IMAGING — NM NM MISC PROCEDURE
9 series · 54 of 54 positions shown · non-contrast
Comparison: none

[Series 1: stress sax · 6.4mm · 6.40mm/px · 6 of 29 frames shown]
[frame 3/29]
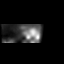
[frame 7/29]
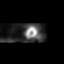
[frame 12/29]
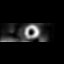
[frame 17/29]
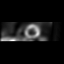
[frame 22/29]
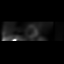
[frame 27/29]
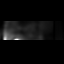

[Series 1: wbr_s-proj_st wbr stress-gsp · 6.40mm/px · 6 of 512 frames shown]
[frame 43/512]
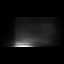
[frame 128/512]
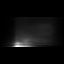
[frame 214/512]
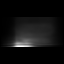
[frame 299/512]
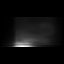
[frame 384/512]
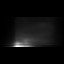
[frame 470/512]
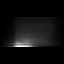

[Series 1: stress sax gs · 6.4mm · 6.40mm/px · 6 of 232 frames shown]
[frame 20/232]
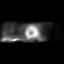
[frame 58/232]
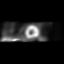
[frame 97/232]
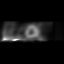
[frame 136/232]
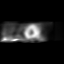
[frame 174/232]
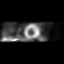
[frame 213/232]
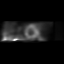

[Series 1: wbr stress-gsp · 6.40mm/px · 6 of 512 frames shown]
[frame 43/512]
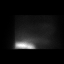
[frame 128/512]
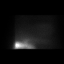
[frame 214/512]
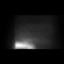
[frame 299/512]
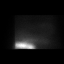
[frame 384/512]
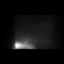
[frame 470/512]
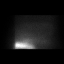

[Series 2: wbr_s-proj_st wbr stress-sum-em · 6.40mm/px · 6 of 64 frames shown]
[frame 6/64]
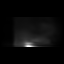
[frame 16/64]
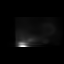
[frame 27/64]
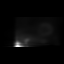
[frame 38/64]
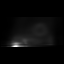
[frame 48/64]
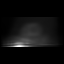
[frame 59/64]
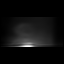

[Series 2: wbr stress-sum-em · 6.40mm/px · 6 of 64 frames shown]
[frame 6/64]
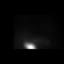
[frame 16/64]
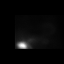
[frame 27/64]
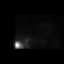
[frame 38/64]
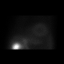
[frame 48/64]
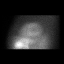
[frame 59/64]
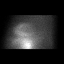

[Series 3: rest sax · 6.4mm · 6.40mm/px · 6 of 29 frames shown]
[frame 3/29]
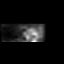
[frame 7/29]
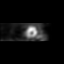
[frame 12/29]
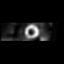
[frame 17/29]
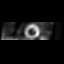
[frame 22/29]
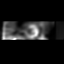
[frame 27/29]
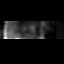

[Series 3: wbr_r-proj_st wbr rest · 6.40mm/px · 6 of 64 frames shown]
[frame 6/64]
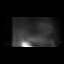
[frame 16/64]
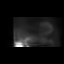
[frame 27/64]
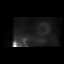
[frame 38/64]
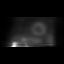
[frame 48/64]
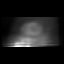
[frame 59/64]
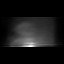

[Series 3: wbr rest · 6.40mm/px · 6 of 64 frames shown]
[frame 6/64]
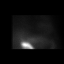
[frame 16/64]
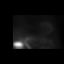
[frame 27/64]
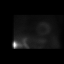
[frame 38/64]
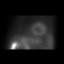
[frame 48/64]
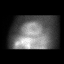
[frame 59/64]
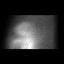

[54 of 54 positions shown; findings below may reference images not displayed]

Canned report from images found in remote index.

Refer to host system for actual result text.

## 2017-02-11 MED ORDER — TECHNETIUM TC 99M TETROFOSMIN IV KIT
29.6000 | PACK | Freq: Once | INTRAVENOUS | Status: AC | PRN
  Administered 2017-02-11: 29.6 via INTRAVENOUS
  Filled 2017-02-11: qty 30

## 2017-02-11 MED ORDER — REGADENOSON 0.4 MG/5ML IV SOLN
0.4000 mg | Freq: Once | INTRAVENOUS | Status: AC
  Administered 2017-02-11: 0.4 mg via INTRAVENOUS

## 2017-02-12 ENCOUNTER — Other Ambulatory Visit: Payer: Self-pay | Admitting: *Deleted

## 2017-02-12 ENCOUNTER — Ambulatory Visit (HOSPITAL_COMMUNITY)
Admission: RE | Admit: 2017-02-12 | Discharge: 2017-02-12 | Disposition: A | Discharge status: Home / Self Care | Payer: Medicare HMO | Source: Ambulatory Visit | Attending: Internal Medicine | Admitting: Internal Medicine

## 2017-02-12 DIAGNOSIS — E118 Type 2 diabetes mellitus with unspecified complications: Secondary | ICD-10-CM

## 2017-02-12 DIAGNOSIS — I10 Essential (primary) hypertension: Secondary | ICD-10-CM

## 2017-02-12 DIAGNOSIS — R931 Abnormal findings on diagnostic imaging of heart and coronary circulation: Secondary | ICD-10-CM

## 2017-02-12 DIAGNOSIS — I77810 Thoracic aortic ectasia: Secondary | ICD-10-CM

## 2017-02-12 MED ORDER — REGADENOSON 0.4 MG/5ML IV SOLN: 0.4000 mg | Freq: Once | INTRAVENOUS | Status: DC

## 2017-02-12 MED ORDER — TECHNETIUM TC 99M TETROFOSMIN IV KIT: 29.6000 | PACK | Freq: Once | INTRAVENOUS | Status: DC | PRN

## 2017-02-12 MED ORDER — TECHNETIUM TC 99M TETROFOSMIN IV KIT
31.3000 | PACK | Freq: Once | INTRAVENOUS | Status: AC | PRN
  Administered 2017-02-12: 31.3 via INTRAVENOUS

## 2017-02-12 MED ORDER — METOPROLOL SUCCINATE ER 25 MG PO TB24: 25.0000 mg | ORAL_TABLET | Freq: Every day | ORAL | 6 refills | Status: DC

## 2017-02-12 MED ORDER — AZILSARTAN MEDOXOMIL 40 MG PO TABS: 1.0000 | ORAL_TABLET | Freq: Every day | ORAL | 3 refills | Status: DC

## 2017-02-16 LAB — MYOCARDIAL PERFUSION IMAGING
LV dias vol: 234 mL (ref 62–150)
LV sys vol: 143 mL
Peak HR: 74 {beats}/min
Rest HR: 63 {beats}/min
SDS: 0
SRS: 0
SSS: 0
TID: 0.98

## 2017-02-18 ENCOUNTER — Telehealth: Payer: Self-pay | Admitting: Cardiology

## 2017-02-18 ENCOUNTER — Other Ambulatory Visit: Payer: Self-pay

## 2017-02-18 DIAGNOSIS — R9431 Abnormal electrocardiogram [ECG] [EKG]: Secondary | ICD-10-CM

## 2017-02-18 DIAGNOSIS — E785 Hyperlipidemia, unspecified: Secondary | ICD-10-CM

## 2017-02-18 NOTE — Telephone Encounter (Signed)
Called the patient and gave him the date, time and location of CT angio.  Told him that he needed to have his lab work done prior to his CT.  He stated he will come in on Monday to get his labs done.

## 2017-02-22 DIAGNOSIS — R9431 Abnormal electrocardiogram [ECG] [EKG]: Secondary | ICD-10-CM | POA: Diagnosis not present

## 2017-02-22 DIAGNOSIS — E785 Hyperlipidemia, unspecified: Secondary | ICD-10-CM | POA: Diagnosis not present

## 2017-02-22 LAB — BASIC METABOLIC PANEL
BUN/Creatinine Ratio: 19 (ref 10–24)
BUN: 18 mg/dL (ref 8–27)
CO2: 24 mmol/L (ref 20–29)
Calcium: 9.4 mg/dL (ref 8.6–10.2)
Chloride: 102 mmol/L (ref 96–106)
Creatinine, Ser: 0.96 mg/dL (ref 0.76–1.27)
GFR calc Af Amer: 93 mL/min/{1.73_m2} (ref >59)
GFR calc non Af Amer: 80 mL/min/{1.73_m2} (ref >59)
Glucose: 114 mg/dL — ABNORMAL HIGH (ref 65–99)
Potassium: 4.9 mmol/L (ref 3.5–5.2)
Sodium: 138 mmol/L (ref 134–144)

## 2017-02-26 ENCOUNTER — Ambulatory Visit (INDEPENDENT_AMBULATORY_CARE_PROVIDER_SITE_OTHER)
Admission: RE | Admit: 2017-02-26 | Discharge: 2017-02-26 | Disposition: A | Discharge status: Home / Self Care | Payer: Medicare HMO | Source: Ambulatory Visit | Attending: Cardiology | Admitting: Cardiology

## 2017-02-26 ENCOUNTER — Inpatient Hospital Stay: Admission: RE | Admit: 2017-02-26 | Payer: Medicare HMO | Source: Ambulatory Visit

## 2017-02-26 DIAGNOSIS — R931 Abnormal findings on diagnostic imaging of heart and coronary circulation: Secondary | ICD-10-CM | POA: Diagnosis not present

## 2017-02-26 DIAGNOSIS — I77810 Thoracic aortic ectasia: Secondary | ICD-10-CM | POA: Diagnosis not present

## 2017-02-26 IMAGING — CT CT ANGIO CHEST
2 of 7 series · 18 of 36 positions shown · IV contrast (isovue)
Comparison: None.

CLINICAL DATA: Aortic root dilatation.

EXAM:
CT ANGIOGRAPHY CHEST WITH CONTRAST
TECHNIQUE: Multidetector CT imaging of the chest was performed using the
standard protocol during bolus administration of intravenous
contrast. Multiplanar CT image reconstructions and MIPs were
obtained to evaluate the vascular anatomy.
CONTRAST:  100 mL of Isovue 370 intravenously.

[Series 4: aorta 3.0 i31f 2 · axial · 0.90mm/px · z∈[-365,-44]mm · 17 of 119 slices shown]
[im 6/119  lung]
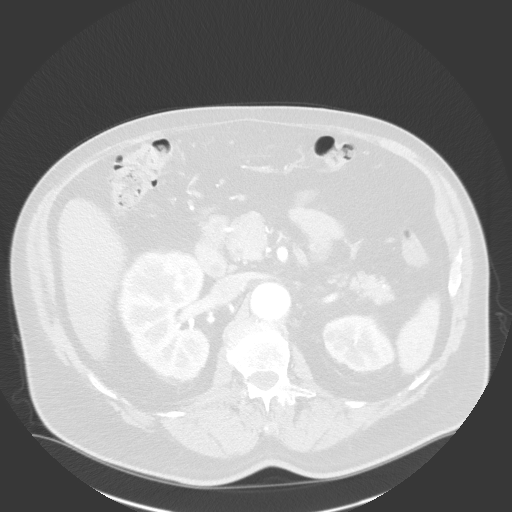
[im 11/119  mediastinal]
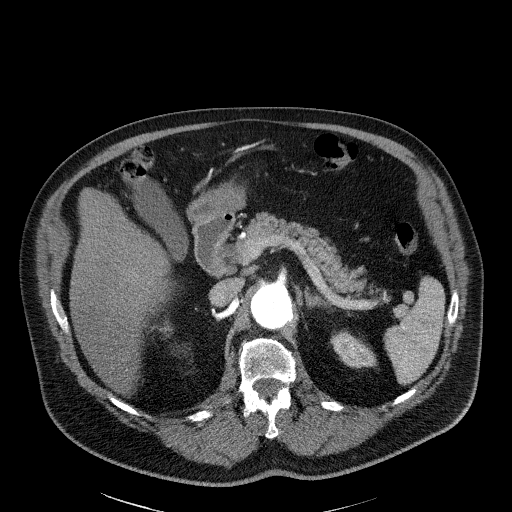
[im 21/119  lung]
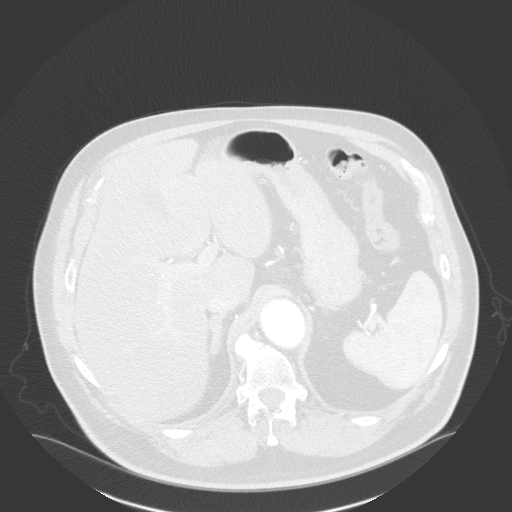
[im 26/119  mediastinal]
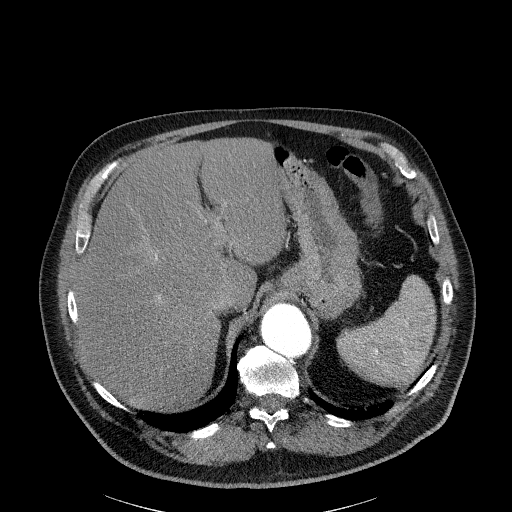
[im 31/119  lung]
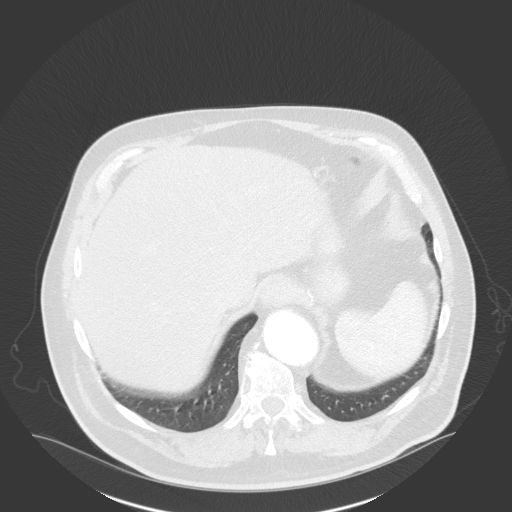
[im 42/119  mediastinal]
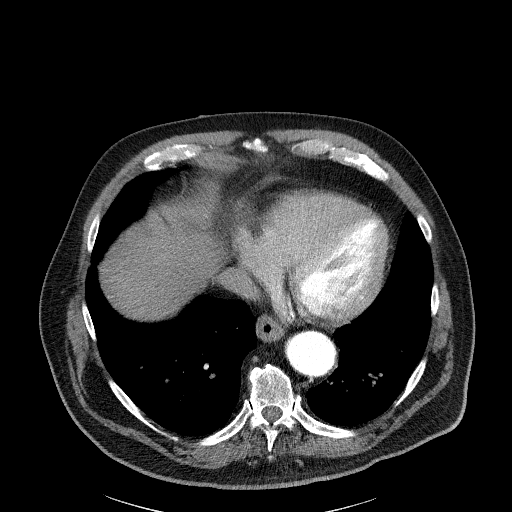
[im 47/119  lung]
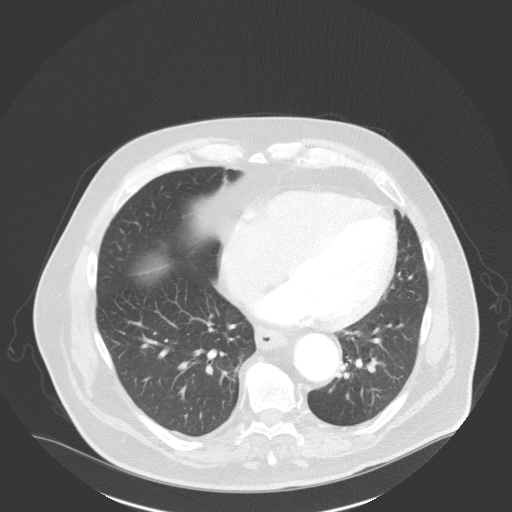
[im 52/119  mediastinal]
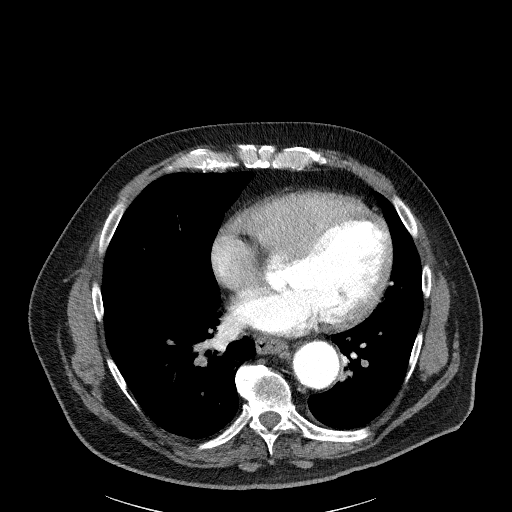
[im 62/119  lung]
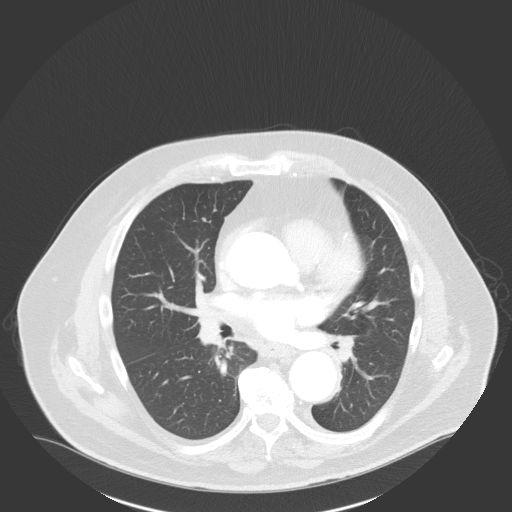
[im 67/119  mediastinal]
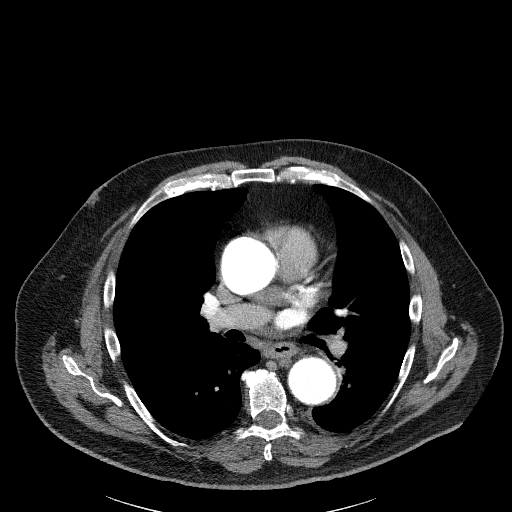
[im 72/119  lung]
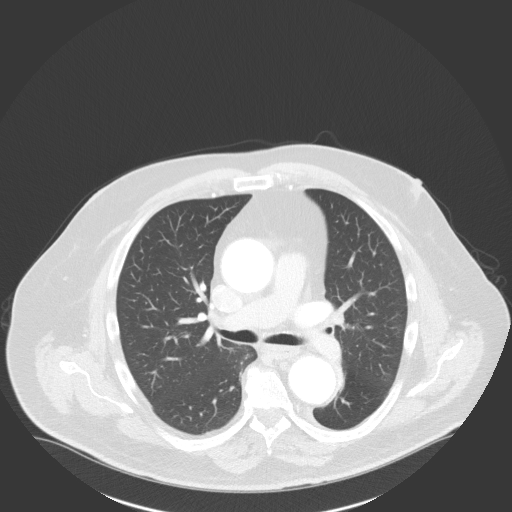
[im 77/119  mediastinal]
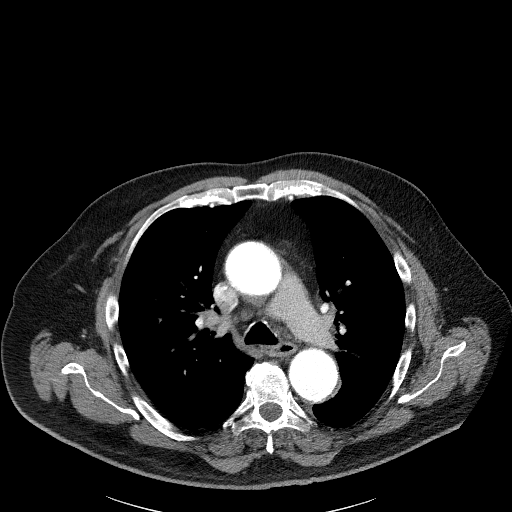
[im 88/119  lung]
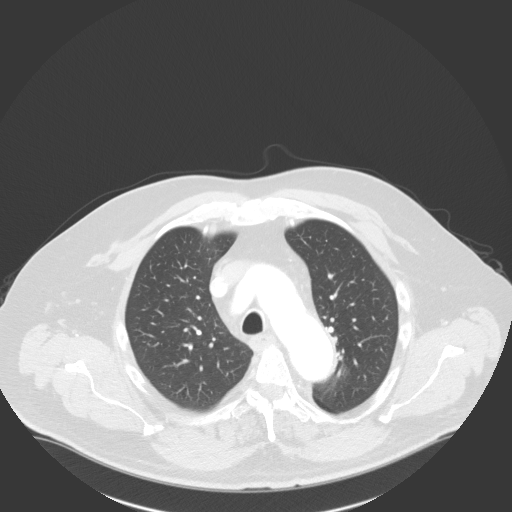
[im 93/119  mediastinal]
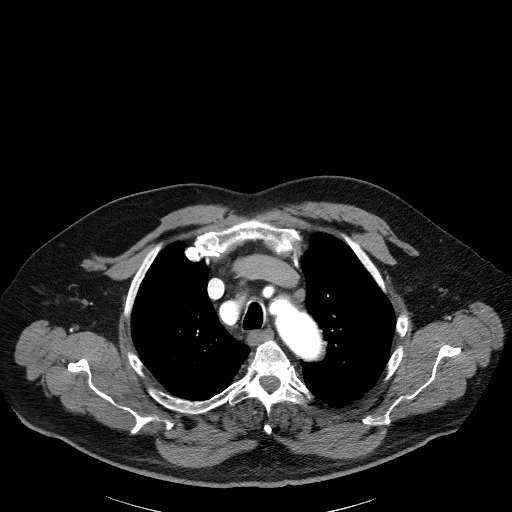
[im 98/119  lung]
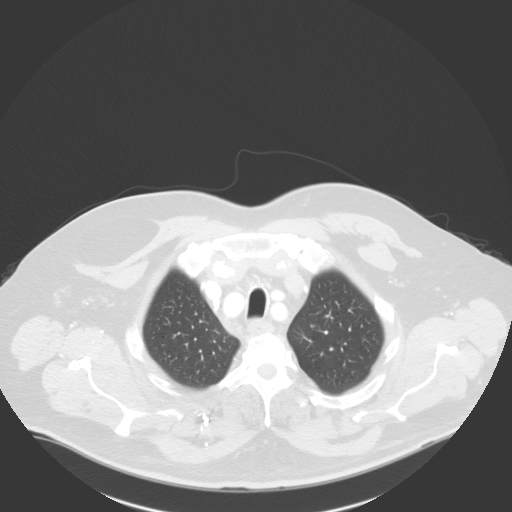
[im 108/119  mediastinal]
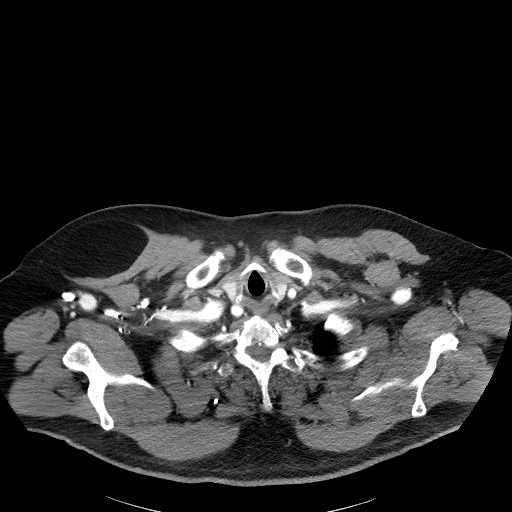
[im 113/119  lung]
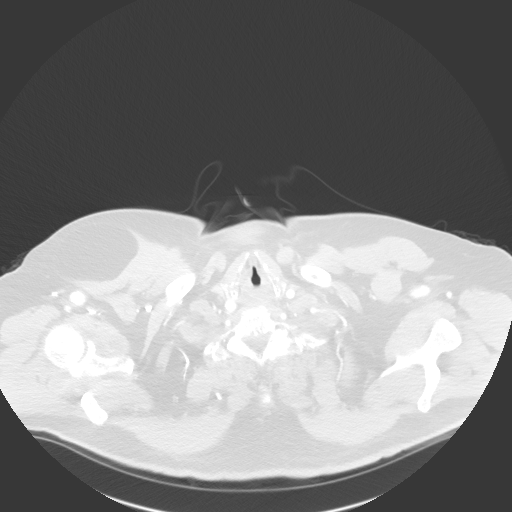

[Series 7: coronals · coronal · 0.70mm/px · 1 of 159 slices shown]
[im 80/159  mediastinal]
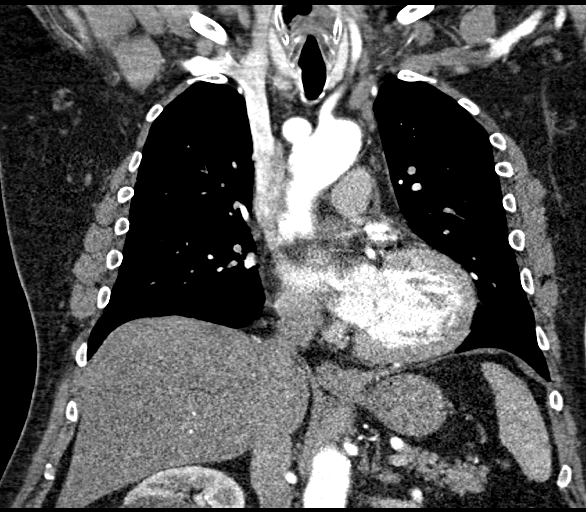

[18 of 36 positions shown; findings below may reference images not displayed]

FINDINGS: Cardiovascular: Aortic root measures 3.9 cm in diameter. 4.6 cm
ascending thoracic aortic aneurysm is noted. Transverse aortic arch
measures 3.1 cm. Aneurysmal dilatation of descending thoracic aorta
is noted with maximum measured diameter 4.1 cm. Aortic
atherosclerosis is noted without dissection. Great vessels are
widely patent without stenosis. No pericardial effusion is noted.

Mediastinum/Nodes: No enlarged mediastinal, hilar, or axillary lymph
nodes. Thyroid gland, trachea, and esophagus demonstrate no
significant findings.

Lungs/Pleura: Lungs are clear. No pleural effusion or pneumothorax.

Upper Abdomen: Probable fatty infiltration of the liver is noted.

Musculoskeletal: 8.6 x 5.3 cm fat containing lesion is seen within
the right pectoralis muscle most consistent with intramuscular
lipoma.

Review of the MIP images confirms the above findings.
IMPRESSION: 4.6 cm ascending thoracic aortic aneurysm. Recommend semi-annual
imaging followup by CTA or MRA and referral to cardiothoracic
surgery if not already obtained. This recommendation follows [HM]
ACCF/AHA/AATS/ACR/ASA/SCA/LOPZ/LOPZ/LOPZ/LOPZ Guidelines for the
Diagnosis and Management of Patients With Thoracic Aortic Disease.
Circulation. [HM]; 121: e266-e369.

Probable fatty infiltration of the liver.

Probable 8.6 x 5.3 cm intramuscular lipoma seen within the right
pectoralis muscle.

Aortic Atherosclerosis ([HM]-[HM]).

## 2017-02-26 MED ORDER — IOPAMIDOL (ISOVUE-370) INJECTION 76%
100.0000 mL | Freq: Once | INTRAVENOUS | Status: AC | PRN
  Administered 2017-02-26: 100 mL via INTRAVENOUS

## 2017-03-05 NOTE — Progress Notes (Signed)
Cardiology Office Note   Date:  03/11/2017   ID:  Clinton, Black 1945/06/22, MRN [id number]  PCP:  [name], MD  Cardiologist:    [name], MD   Chief Complaint  Patient presents with  . Thoracic Aortic Aneurysm      History of Present Illness: Clinton Black is a 71 y.o. male who is seen for follow up DOE and thoracic aneurysm. He has a history of DM type 2, morbid obesity, HLD, and HTN.   He report that earlier this year he was experiencing dyspnea, often awakening him at night. He states that since he stopped smoking marijuana this has largely resolved.  He was seen by Dr. Ronnald Ramp and noted to have frequent ectopy. Ecg showed PVC couplets. He was hypertensive and started on Edarbi.  Denied any chest pain or syncope. No edema. Quit smoking in 1996. Has lost 25-30 lbs this year. He does have a family history of CAD.   Since his initial visit he underwent evaluation with an Echocardiogram that showed "normal" LV function with moderate AI and dilated aortic root at 44 mm. On my review I felt his EF was mildly impaired at 45%. Myoview study showed a fixed inferobasal defect, no ischemia. EF 39%. He was started on Toprol XL and a CT of the aorta was ordered. 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 He reports he ran out of [place]. Denies any complaints today.  Past Medical History:  Diagnosis Date  . Arthritis   . BPH (benign prostatic hyperplasia)   . Diabetes mellitus type 2 in obese ([place])   . HTN (hypertension)   . Hyperlipidemia, mixed   . Insomnia   . Obesity     Past Surgical History:  Procedure Laterality Date  . COLONOSCOPY    . CYST EXCISION     polynomial  . KNEE SURGERY  2000  . TONSILLECTOMY    . TONSILLECTOMY    . widson teeth extraction       Current Outpatient Prescriptions  Medication Sig Dispense Refill  . aspirin 81 MG tablet Take 1 tablet (81 mg total) by mouth daily. 90 tablet  3  . atorvastatin (LIPITOR) 20 MG tablet Take 1 tablet (20 mg total) by mouth daily. 90 tablet 0  . finasteride (PROSCAR) 5 MG tablet Take 5 mg by mouth daily.    . metoprolol succinate (TOPROL XL) 25 MG 24 hr tablet Take 1 tablet (25 mg total) by mouth daily. 30 tablet 6  . triazolam (HALCION) 0.25 MG tablet Take 1 tablet (0.25 mg total) by mouth at bedtime as needed. 30 tablet 2  . losartan (COZAAR) 50 MG tablet Take 1 tablet (50 mg total) by mouth daily. 90 tablet 3   No current facility-administered medications for this visit.     Allergies:   Patient has no known allergies.    Social History:  The patient  reports that he quit smoking about 22 years ago. He has never used smokeless tobacco. He reports that he drinks about 8.4 oz of alcohol per week . He reports that he uses drugs.   Family History:  The patient's family history includes Aneurysm in his father; Heart disease in his father.    ROS:  Please see the history of present illness.   Otherwise, review of systems are positive for none.   All other systems are reviewed and negative.  PHYSICAL EXAM: VS:  BP (!) 143/65   Pulse (!) 59   Ht 6\' 4"  (1.93 m)   Wt 295 lb (133.8 kg)   BMI 35.91 kg/m  , BMI Body mass index is 35.91 kg/m. GENERAL:  Well appearing, obese WM in NAD HEENT:  PERRL, EOMI, sclera are clear. Oropharynx is clear. NECK:  No jugular venous distention, carotid upstroke brisk and symmetric, no bruits, no thyromegaly or adenopathy LUNGS:  Clear to auscultation bilaterally CHEST:  Unremarkable HEART:  RRR,  PMI not displaced or sustained,S1 and S2 within normal limits, no S3, no S4: no clicks, no rubs, gr 6-3/8 diastolic murmur LSB ABD:  Soft, nontender. BS +, no masses or bruits. No hepatomegaly, no splenomegaly EXT:  2 + pulses throughout, no edema, no cyanosis no clubbing SKIN:  Warm and dry.  No rashes NEURO:  Alert and oriented x 3. Cranial nerves II through XII intact. PSYCH:  Cognitively  intact  EKG:  EKG is not ordered today.  Recent Labs: 12/24/2016: ALT 27; Hemoglobin 14.6; Platelets 171.0; Pro B Natriuretic peptide (BNP) 130.0; TSH 2.72 02/22/2017: BUN 18; Creatinine, Ser 0.96; Potassium 4.9; Sodium 138    Lipid Panel    Component Value Date/Time   CHOL 185 05/11/2016 1433   TRIG 234.0 (H) 05/11/2016 1433   HDL 41.20 05/11/2016 1433   CHOLHDL 4 05/11/2016 1433   VLDL 46.8 (H) 05/11/2016 1433   LDLCALC 114 (H) 10/06/2013 1123   LDLDIRECT 120.0 05/11/2016 1433      Wt Readings from Last 3 Encounters:  03/11/17 295 lb (133.8 kg)  02/11/17 290 lb (131.5 kg)  02/01/17 290 lb 3.2 oz (131.6 kg)      Other studies Reviewed: Additional studies/ records that were reviewed today include:   Echo: 02/05/17:  Notes recorded by [name],  M, MD on 02/06/2017 at 8:17 AM EDT Echo shows normal LV function. He has moderate AI. Aortic root noted to be markedly dilated. Needs CT angiogram of aorta to evaluate for thoracic aneurysm. I would also start on Toprol XL 25 mg daily in addition to his other meds. Myoview study is pending.   [name] MD, Clinton Black      Study Result   Result status: Final result                           [facility]                        [address], [place] [address]                            [phone]  ------------------------------------------------------------------- Transthoracic Echocardiography  Patient:    [name], Sales MR #:       [id number] Study Date: 02/05/2017 Gender:     M Age:        70 Height:     193 cm Weight:     131.6 kg BSA:        2.7 m^2 Pt. Status: Room:   ORDERING      [name], M.D.  REFERRING     [name], M.D.  ATTENDING    Jenkins Rouge, M.D.  REFERRING    Jenkins Rouge, M.D.  SONOGRAPHER  Laie,  RDCS  PERFORMING   [facility], Outpatient  cc:  ------------------------------------------------------------------- LV EF:  55%  ------------------------------------------------------------------- Indications:      PVC (I49.3).  ------------------------------------------------------------------- History:   PMH:   Dyspnea.  Risk factors:  Former tobacco use. Hypertension. Diabetes mellitus. Morbidly obese. Dyslipidemia.  ------------------------------------------------------------------- Study Conclusions  - Left ventricle: The cavity size was mildly dilated. Wall   thickness was normal. Systolic function was normal. The estimated   ejection fraction was 55%. Wall motion was normal; there were no   regional wall motion abnormalities. The study is not technically   sufficient to allow evaluation of LV diastolic function. - Aortic valve: There was moderate regurgitation. - Aorta: Severe dilatation of ascending aortic root suggest CTA to   accurately measure. - Left atrium: The atrium was mildly dilated. - Atrial septum: No defect or patent foramen ovale was identified.  ------------------------------------------------------------------- Study data:  No prior study was available for comparison.  Study status:  Routine.  Procedure:  The patient reported no pain pre or post test. Transthoracic echocardiography. Image quality was adequate.  Study completion:  There were no complications. Transthoracic echocardiography.  M-mode, complete 2D, 3D, spectral Doppler, and color Doppler.  Birthdate:  Patient birthdate: 1946/03/25.  Age:  Patient is 71 yr old.  Sex:  Gender: male. BMI: 35.3 kg/m^2.  Blood pressure:     142/80  Patient status: Outpatient.  Study date:  Study date: 02/05/2017. Study time: 12:44 PM.  Location:  [facility]  -------------------------------------------------------------------  ------------------------------------------------------------------- Left ventricle:  The cavity size was mildly dilated. Wall thickness was normal. Systolic function was normal. The estimated  ejection fraction was 55%. Wall motion was normal; there were no regional wall motion abnormalities. The study is not technically sufficient to allow evaluation of LV diastolic function.  ------------------------------------------------------------------- Aortic valve:   Trileaflet; normal thickness leaflets. Mobility was not restricted.  Doppler:  Transvalvular velocity was within the normal range. There was no stenosis. There was moderate regurgitation.  ------------------------------------------------------------------- Aorta:  Severe dilatation of ascending aortic root suggest CTA to accurately measure. Aortic root: The aortic root was normal in size.  ------------------------------------------------------------------- Mitral valve:   Structurally normal valve.   Mobility was not restricted.  Doppler:  Transvalvular velocity was within the normal range. There was no evidence for stenosis. There was no regurgitation.  ------------------------------------------------------------------- Left atrium:  The atrium was mildly dilated.  ------------------------------------------------------------------- Atrial septum:  No defect or patent foramen ovale was identified.   ------------------------------------------------------------------- Right ventricle:  The cavity size was normal. Wall thickness was normal. Systolic function was normal.  ------------------------------------------------------------------- Pulmonic valve:    Doppler:  Transvalvular velocity was within the normal range. There was no evidence for stenosis. There was trivial regurgitation.  ------------------------------------------------------------------- Tricuspid valve:   Structurally normal valve.    Doppler: Transvalvular velocity was within the normal range. There was mild regurgitation.  ------------------------------------------------------------------- Pulmonary artery:   The main pulmonary artery  was normal-sized. Systolic pressure was within the normal range.  ------------------------------------------------------------------- Right atrium:  The atrium was normal in size.  ------------------------------------------------------------------- Pericardium:  The pericardium was normal in appearance. There was no pericardial effusion.  ------------------------------------------------------------------- Systemic veins: Inferior vena cava: The vessel was normal in size. The respirophasic diameter changes were in the normal range (= 50%), consistent with normal central venous pressure.  ------------------------------------------------------------------- Post procedure conclusions Ascending Aorta:  - Severe dilatation of ascending aortic root suggest CTA to   accurately measure.    Myoview 02/12/17: Study Highlights    The left  ventricular ejection fraction is moderately decreased (30-44%).  Nuclear stress EF: 39%.  There was no ST segment deviation noted during stress.  No T wave inversion was noted during stress.  This is an intermediate risk study due to reduced systolic function.  There is no ischemia.   CT ANGIOGRAPHY CHEST WITH CONTRAST  TECHNIQUE: Multidetector CT imaging of the chest was performed using the standard protocol during bolus administration of intravenous contrast. Multiplanar CT image reconstructions and MIPs were obtained to evaluate the vascular anatomy.  CONTRAST:  100 mL of Isovue 370 intravenously.  COMPARISON:  None.  FINDINGS: Cardiovascular: Aortic root measures 3.9 cm in diameter. 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 Aneurysmal dilatation of descending thoracic aorta is noted with maximum measured diameter 4.1 cm. Aortic atherosclerosis is noted without dissection. Great vessels are widely patent without stenosis. No pericardial effusion is  noted.  Mediastinum/Nodes: No enlarged mediastinal, hilar, or axillary lymph nodes. Thyroid gland, trachea, and esophagus demonstrate no significant findings.  Lungs/Pleura: Lungs are clear. No pleural effusion or pneumothorax.  Upper Abdomen: Probable fatty infiltration of the liver is noted.  Musculoskeletal: 8.6 x 5.3 cm fat containing lesion is seen within the right pectoralis muscle most consistent with intramuscular lipoma.  Review of the MIP images confirms the above findings.  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 Recommend semi-annual imaging followup by CTA or MRA and referral to cardiothoracic surgery if not already obtained. This recommendation follows 2010 ACCF/AHA/AATS/ACR/ASA/SCA/SCAI/SIR/STS/SVM Guidelines for the Diagnosis and Management of Patients With Thoracic Aortic Disease. Circulation. 2010; 121: A213-Y865.  Probable fatty infiltration of the liver.  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  Aortic Atherosclerosis (ICD10-I70.0).   Electronically Signed   By: Marijo Conception, M.D.   On: 02/26/2017 11:57   ASSESSMENT AND PLAN: 1. Thoracic aortic aneurysm. 4.6 cm. Also 4.1 cm dilation of the descending aorta. Recommend aggressive BP control. Continue Toprol XL 25 mg daily and will start losartan 50 mg daily.  Will refer to CT surgery for evaluation of aneurysm. Recommend avoiding heavy lifting or staining.  2. 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 Need to monitor at least yearly. Asymptomatic.  3. Dyspnea- BNP normal. May have component of sleep apnea. Home sleep study planned. He does have Mild LV impairment. On ARB and Beta blocker.  4. Frequent PVCs. See above 5. HTN.  6. DM type 2 7. HLD- on lipitor. Will update fasting lab work.   Current medicines are reviewed at length with the patient today.  The patient does not have concerns regarding medicines.  The following changes have  been made:  See above.  Labs/ tests ordered today include:   Orders Placed This Encounter  Procedures  . Basic metabolic panel  . Lipid Panel w/o Chol/HDL Ratio  . Hepatic function panel  . Ambulatory referral to Cardiothoracic Surgery     Disposition:   FU with me 6 months.  Signed,  [name], MD ,[facility] 03/11/2017 3:47 PM    [place] [address], [place], [place], [address] Phone [phone], Fax [phone]

## 2017-03-11 ENCOUNTER — Ambulatory Visit (INDEPENDENT_AMBULATORY_CARE_PROVIDER_SITE_OTHER): Payer: Medicare HMO | Admitting: Cardiology

## 2017-03-11 ENCOUNTER — Encounter: Payer: Self-pay | Admitting: Cardiology

## 2017-03-11 DIAGNOSIS — I10 Essential (primary) hypertension: Secondary | ICD-10-CM | POA: Diagnosis not present

## 2017-03-11 DIAGNOSIS — I351 Nonrheumatic aortic (valve) insufficiency: Secondary | ICD-10-CM | POA: Diagnosis not present

## 2017-03-11 DIAGNOSIS — E7800 Pure hypercholesterolemia, unspecified: Secondary | ICD-10-CM | POA: Diagnosis not present

## 2017-03-11 DIAGNOSIS — I712 Thoracic aortic aneurysm, without rupture, unspecified: Secondary | ICD-10-CM

## 2017-03-11 MED ORDER — LOSARTAN POTASSIUM 50 MG PO TABS
50.0000 mg | ORAL_TABLET | Freq: Every day | ORAL | 3 refills | Status: DC
Start: 2017-03-11 — End: 2018-03-02

## 2017-03-11 NOTE — Patient Instructions (Addendum)
Continue Toprol XL 25 mg daily and losartan 50 mg daily for BP control  Continue lipitor  We will check your cholesterol levels.  We will schedule you to see thoracic surgery  I will see you in 6 months.

## 2017-03-12 LAB — LIPID PANEL W/O CHOL/HDL RATIO
Cholesterol, Total: 105 mg/dL (ref 100–199)
HDL: 36 mg/dL — ABNORMAL LOW (ref >39)
LDL Calculated: 46 mg/dL (ref 0–99)
Triglycerides: 115 mg/dL (ref 0–149)
VLDL Cholesterol Cal: 23 mg/dL (ref 5–40)

## 2017-03-12 LAB — BASIC METABOLIC PANEL
BUN/Creatinine Ratio: 22 (ref 10–24)
BUN: 18 mg/dL (ref 8–27)
CO2: 22 mmol/L (ref 20–29)
Calcium: 9.2 mg/dL (ref 8.6–10.2)
Chloride: 107 mmol/L — ABNORMAL HIGH (ref 96–106)
Creatinine, Ser: 0.82 mg/dL (ref 0.76–1.27)
GFR calc Af Amer: 104 mL/min/{1.73_m2} (ref >59)
GFR calc non Af Amer: 90 mL/min/{1.73_m2} (ref >59)
Glucose: 128 mg/dL — ABNORMAL HIGH (ref 65–99)
Potassium: 4.2 mmol/L (ref 3.5–5.2)
Sodium: 143 mmol/L (ref 134–144)

## 2017-03-12 LAB — HEPATIC FUNCTION PANEL
ALT: 31 IU/L (ref 0–44)
AST: 19 IU/L (ref 0–40)
Albumin: 4.5 g/dL (ref 3.6–4.8)
Alkaline Phosphatase: 98 IU/L (ref 39–117)
Bilirubin Total: 0.6 mg/dL (ref 0.0–1.2)
Bilirubin, Direct: 0.18 mg/dL (ref 0.00–0.40)
Total Protein: 6.5 g/dL (ref 6.0–8.5)

## 2017-04-05 ENCOUNTER — Other Ambulatory Visit: Payer: Self-pay | Admitting: Internal Medicine

## 2017-04-05 DIAGNOSIS — G4700 Insomnia, unspecified: Secondary | ICD-10-CM

## 2017-04-05 DIAGNOSIS — G4730 Sleep apnea, unspecified: Principal | ICD-10-CM

## 2017-04-05 NOTE — Telephone Encounter (Signed)
rx faxed to [facility].  

## 2017-04-15 ENCOUNTER — Encounter: Payer: Medicare HMO | Admitting: Cardiothoracic Surgery

## 2017-04-19 ENCOUNTER — Encounter: Payer: Self-pay | Admitting: Cardiothoracic Surgery

## 2017-04-19 ENCOUNTER — Institutional Professional Consult (permissible substitution): Payer: Medicare HMO | Admitting: Cardiothoracic Surgery

## 2017-04-19 VITALS — BP 134/65 | HR 58 | Ht 76.0 in | Wt 289.0 lb

## 2017-04-19 DIAGNOSIS — I517 Cardiomegaly: Secondary | ICD-10-CM | POA: Diagnosis not present

## 2017-04-19 DIAGNOSIS — I712 Thoracic aortic aneurysm, without rupture, unspecified: Secondary | ICD-10-CM

## 2017-04-19 DIAGNOSIS — I351 Nonrheumatic aortic (valve) insufficiency: Secondary | ICD-10-CM

## 2017-04-19 NOTE — Patient Instructions (Signed)

## 2017-04-19 NOTE — Progress Notes (Signed)
[address]       [place] [address]             [phone]                    Clinton Black [place] Medical Record #[id number] Date of Birth: 01-Feb-1946  Referring: [name], [name], MD Primary Care: [name], MD  Chief Complaint:    Chief Complaint  Patient presents with  . New Patient (Initial Visit)    Thoracic aneurysm 4.6cm,  CTA chest 02/26/2017    History of Present Illness:    Clinton Black 71 y.o. male is seen in the office  today for evaluation of ascending aortic dilatation maximum 4.6 cm.  The patient recently was seen by his primary care doctor complaining of urinary frequency at night nocturia and shortness of breath.  He is referred to cardiology echocardiogram nuclear stress test and CT a of the chest have been performed.     The patient notes that he frequently wakes at night and has to rest a minute before he can walk to the bathroom because of shortness of breath.  With moderate activity he also notes shortness of breath.  He denies pedal edema.  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  Patient has no family history of aortic dissection he does note that his father died at age 55 suddenly with a cerebral embolus mother died at age 71 he is had one sister died at age 92 of breast cancer and another sister has had breast cancer in atrial fib still alive patient has no children no family history of aortic dissections or aneurysm.  Current Activity/ Functional Status:  Patient is independent with mobility/ambulation, transfers, ADL's, IADL's.   Zubrod Score: At the time of surgery this patient's most appropriate activity status/level should be described as: []     0    Normal activity, no symptoms [x]     1    Restricted in physical strenuous activity but ambulatory, able to do out light work []     2    Ambulatory and capable of self care, unable to do work activities, up and about               >50 % of waking  hours                              []     3    Only limited self care, in bed greater than 50% of waking hours []     4    Completely disabled, no self care, confined to bed or chair []     5    Moribund   Past Medical History:  Diagnosis Date  . Arthritis   . BPH (benign prostatic hyperplasia)   . Diabetes mellitus type 2 in obese ([place])   . HTN (hypertension)   . Hyperlipidemia, mixed   . Insomnia   . Obesity     Past Surgical History:  Procedure Laterality Date  . COLONOSCOPY    . CYST EXCISION     polynomial  . KNEE SURGERY  2000  . TONSILLECTOMY    . TONSILLECTOMY    . widson teeth extraction      Family History  Problem Relation Age of Onset  . Aneurysm Father   . Heart disease Father   . Varicose  Veins Mother   . Arthritis Mother   . Macular degeneration Mother   . Cancer Neg Hx   . Colon cancer Neg Hx   . Esophageal cancer Neg Hx   . Rectal cancer Neg Hx   . Stomach cancer Neg Hx     Social History   Socioeconomic History  . Marital status: Single    Spouse name: Not on file  . Number of children: Not on file  . Years of education: Not on file  . Highest education level: Not on file  Social Needs  . Financial resource strain: Not on file  . Food insecurity - worry: Not on file  . Food insecurity - inability: Not on file  . Transportation needs - medical: Not on file  . Transportation needs - non-medical: Not on file  Occupational History  . Not on file  Tobacco Use  . Smoking status: Former Smoker    Last attempt to quit: 06/15/1994    Years since quitting: 22.8  . Smokeless tobacco: Never Used  Substance and Sexual Activity  . Alcohol use: Yes    Alcohol/week: 8.4 oz    Types: 14 Shots of liquor per week    Comment: 2 drinks of liquor per day  . Drug use: Yes    Comment: occasionally  . Sexual activity: Yes    Partners: Female    Birth control/protection: Condom    Comment: condom use most of the time but not always  Other Topics Concern    . Not on file  [place] - [employer] and [employer]. married '72- 3 years/divorced; married '89- 3 years/divorced;. married '03 - 3 years/divorced. 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    Social History   Tobacco Use  Smoking Status Former Smoker  . Last attempt to quit: 06/15/1994  . Years since quitting: 22.8  Smokeless Tobacco Never Used    Social History   Substance and Sexual Activity  Alcohol Use Yes  . Alcohol/week: 8.4 oz  . Types: 14 Shots of liquor per week   Comment: 2 drinks of liquor per day     No Known Allergies  Current Outpatient Medications  Medication Sig Dispense Refill  . aspirin 81 MG tablet Take 1 tablet (81 mg total) by mouth daily. 90 tablet 3  . atorvastatin (LIPITOR) 20 MG tablet Take 1 tablet (20 mg total) by mouth daily. 90 tablet 0  . finasteride (PROSCAR) 5 MG tablet Take 5 mg by mouth daily.    Marland Kitchen losartan (COZAAR) 50 MG tablet Take 1 tablet (50 mg total) by mouth daily. 90 tablet 3  . metoprolol succinate (TOPROL XL) 25 MG 24 hr tablet Take 1 tablet (25 mg total) by mouth daily. 30 tablet 6  . triazolam (HALCION) 0.25 MG tablet TAKE 1 TABLET BY MOUTH AT BEDTIME AS NEEDED 30 tablet 2   No current facility-administered medications for this visit.     Pertinent items are noted in HPI.   Review of Systems:     Cardiac Review of Systems: Y or N  Chest Pain [  N  ]  Resting SOB [ N  ] Exertional SOB  [Y  ]  Orthopnea [Y  ]   Pedal Edema Aqua.Slicker   ]    Palpitations [ N ] Syncope  Aqua.Slicker  ]   Presyncope [ N  ]  General Review of Systems: [Y] = yes [  ]=no Constitional: recent weight change [LOST 30  LBS YEAR  ];  Wt loss over the last 3 months [   ] anorexia [  ]; fatigue [Y  ]; nausea [  ]; night sweats [  ]; fever [  ]; or chills [  ];          Dental: poor dentition[  ]; Last Dentist visit:   Eye : blurred vision [  ]; diplopia [   ]; vision changes [  ];  Amaurosis fugax[  ]; Resp: cough [ N ];  wheezing[ N ];  hemoptysis[ N ];  shortness of breath[ Y ]; paroxysmal nocturnal dyspnea[ Y ]; dyspnea on exertion[ Y ]; or orthopnea[  ];  GI:  gallstones[  ], vomiting[  ];  dysphagia[  ]; melena[  ];  hematochezia [  ]; heartburn[N  ];   Hx of  Colonoscopy[  ]; GU: kidney stones [  ]; hematuria[  ];   dysuria [  ];  nocturia[  ];  history of     obstruction [  ]; urinary frequency [  ]             Skin: rash, swelling[  ];, hair loss[  ];  peripheral edema[  ];  or itching[  ]; Musculosketetal: myalgias[  ];  joint swelling[  ];  joint erythema[  ];  joint pain[  ];  back pain[ N ];  Heme/Lymph: bruising[ N ];  bleeding[  N];  anemia[  ];  Neuro: TIA[ N ];  headaches[N  ];  stroke[  ];  vertigo[  ];  seizures[  ];   paresthesias[  ];  difficulty walking[Y  ];  Psych:depression[  ]; anxiety[  ];  Endocrine: diabetes[  ];  thyroid dysfunction[  ];  Immunizations: Flu up to date Jazmín.Cullens  ]; Pneumococcal up to date Jazmín.Cullens  ];  Other:  Physical Exam: BP 134/65 (BP Location: Left Arm, Patient Position: Sitting, Cuff Size: Large)   Pulse (!) 58   Ht 6\' 4"  (1.93 m)   Wt 289 lb (131.1 kg)   SpO2 95%   BMI 35.18 kg/m   PHYSICAL EXAMINATION: General appearance: alert, cooperative, appears older than stated age and no distress Head: Normocephalic, without obvious abnormality, atraumatic Neck: no adenopathy, no carotid bruit, no JVD, supple, symmetrical, trachea midline and thyroid not enlarged, symmetric, no tenderness/mass/nodules Lymph nodes: Cervical, supraclavicular, and axillary nodes normal. Resp: clear to auscultation bilaterally Back: symmetric, no curvature. ROM normal. No CVA tenderness. Cardio: regular rate and rhythm and diastolic murmur: late diastolic 2/6, decrescendo at lower left sternal border GI: soft, non-tender; bowel sounds normal; no masses,  no organomegaly Extremities: extremities normal, atraumatic, no cyanosis or edema and Homans sign is negative, no sign of DVT Neurologic: Grossly normal Patient has palpable  DP and PT pulses bilaterally The lipoma of the right anterior chest wall is not obviously palpable on exam  Diagnostic Studies & Laboratory data:     Recent Radiology Findings:   CLINICAL DATA:  Aortic root dilatation.  EXAM: CT ANGIOGRAPHY CHEST WITH CONTRAST  TECHNIQUE: Multidetector CT imaging of the chest was performed using the standard protocol during bolus administration of intravenous contrast. Multiplanar CT image reconstructions and MIPs were obtained to evaluate the vascular anatomy.  CONTRAST:  100 mL of Isovue 370 intravenously.  COMPARISON:  None.  FINDINGS: Cardiovascular: Aortic root measures 3.9 cm in diameter. 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 Aneurysmal dilatation of descending thoracic aorta is noted  with maximum measured diameter 4.1 cm. Aortic atherosclerosis is noted without dissection. Great vessels are widely patent without stenosis. No pericardial effusion is noted.  Mediastinum/Nodes: No enlarged mediastinal, hilar, or axillary lymph nodes. Thyroid gland, trachea, and esophagus demonstrate no significant findings.  Lungs/Pleura: Lungs are clear. No pleural effusion or pneumothorax.  Upper Abdomen: Probable fatty infiltration of the liver is noted.  Musculoskeletal: 8.6 x 5.3 cm fat containing lesion is seen within the right pectoralis muscle most consistent with intramuscular lipoma.  Review of the MIP images confirms the above findings.  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 Recommend semi-annual imaging followup by CTA or MRA and referral to cardiothoracic surgery if not already obtained. This recommendation follows 2010 ACCF/AHA/AATS/ACR/ASA/SCA/SCAI/SIR/STS/SVM Guidelines for the Diagnosis and Management of Patients With Thoracic Aortic Disease. Circulation. 2010; 121: V371-G626.  Probable fatty infiltration of the liver.  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  Aortic Atherosclerosis (ICD10-I70.0).   Electronically Signed   By: Marijo Conception, M.D.   On: 02/26/2017 11:57  I have independently reviewed the above radiologic studies.  Recent Lab Findings: Lab Results  Component Value Date   WBC 7.6 12/24/2016   HGB 14.6 12/24/2016   HCT 43.4 12/24/2016   PLT 171.0 12/24/2016   GLUCOSE 128 (H) 03/11/2017   CHOL 105 03/11/2017   TRIG 115 03/11/2017   HDL 36 (L) 03/11/2017   LDLDIRECT 120.0 05/11/2016   LDLCALC 46 03/11/2017   ALT 31 03/11/2017   AST 19 03/11/2017   NA 143 03/11/2017   K 4.2 03/11/2017   CL 107 (H) 03/11/2017   CREATININE 0.82 03/11/2017   BUN 18 03/11/2017   CO2 22 03/11/2017   TSH 2.72 12/24/2016   HGBA1C 6.8 (H) 12/24/2016   ECHO: [facility]                        [address], [place] [address]                            [phone]  ------------------------------------------------------------------- Transthoracic Echocardiography  Patient:    Gevin, Perea MR #:       [id number] Study Date: 02/05/2017 Gender:     M Age:        29 Height:     193 cm Weight:     131.6 kg BSA:        2.7 m^2 Pt. Status: Room:   ORDERING     [name], M.D.  REFERRING    [name], M.D.  ATTENDING    Jenkins Rouge, M.D.  REFERRING    Jenkins Rouge, M.D.  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  ------------------------------------------------------------------- History:   PMH:   Dyspnea.  Risk factors:  Former tobacco use. Hypertension. Diabetes mellitus. Morbidly obese. Dyslipidemia.  ------------------------------------------------------------------- Study Conclusions  -  Left ventricle: The cavity size was mildly dilated. Wall   thickness was normal. Systolic function was normal. The estimated   ejection fraction was 55%. Wall motion was normal; there were no   regional wall motion abnormalities. The study is not technically   sufficient  to allow evaluation of LV diastolic function. - Aortic valve: There was moderate regurgitation. - Aorta: Severe dilatation of ascending aortic root suggest CTA to   accurately measure. - Left atrium: The atrium was mildly dilated. - Atrial septum: No defect or patent foramen ovale was identified.  ------------------------------------------------------------------- Study data:  No prior study was available for comparison.  Study status:  Routine.  Procedure:  The patient reported no pain pre or post test. Transthoracic echocardiography. Image quality was adequate.  Study completion:  There were no complications. Transthoracic echocardiography.  M-mode, complete 2D, 3D, spectral Doppler, and color Doppler.  Birthdate:  Patient birthdate: 1945/11/26.  Age:  Patient is 71 yr old.  Sex:  Gender: male. BMI: 35.3 kg/m^2.  Blood pressure:     142/80  Patient status: Outpatient.  Study date:  Study date: 02/05/2017. Study time: 12:44 PM.  Location:  [facility]  -------------------------------------------------------------------  ------------------------------------------------------------------- Left ventricle:  The cavity size was mildly dilated. Wall thickness was normal. Systolic function was normal. The estimated ejection fraction was 55%. Wall motion was normal; there were no regional wall motion abnormalities. The study is not technically sufficient to allow evaluation of LV diastolic function.  ------------------------------------------------------------------- Aortic valve:   Trileaflet; normal thickness leaflets. Mobility was not restricted.  Doppler:  Transvalvular velocity was within the normal range.  There was no stenosis. There was moderate regurgitation.  ------------------------------------------------------------------- Aorta:  Severe dilatation of ascending aortic root suggest CTA to accurately measure. Aortic root: The aortic root was normal in size.  ------------------------------------------------------------------- Mitral valve:   Structurally normal valve.   Mobility was not restricted.  Doppler:  Transvalvular velocity was within the normal range. There was no evidence for stenosis. There was no regurgitation.  ------------------------------------------------------------------- Left atrium:  The atrium was mildly dilated.  ------------------------------------------------------------------- Atrial septum:  No defect or patent foramen ovale was identified.   ------------------------------------------------------------------- Right ventricle:  The cavity size was normal. Wall thickness was normal. Systolic function was normal.  ------------------------------------------------------------------- Pulmonic valve:    Doppler:  Transvalvular velocity was within the normal range. There was no evidence for stenosis. There was trivial regurgitation.  ------------------------------------------------------------------- Tricuspid valve:   Structurally normal valve.    Doppler: Transvalvular velocity was within the normal range. There was mild regurgitation.  ------------------------------------------------------------------- Pulmonary artery:   The main pulmonary artery was normal-sized. Systolic pressure was within the normal range.  ------------------------------------------------------------------- Right atrium:  The atrium was normal in size.  ------------------------------------------------------------------- Pericardium:  The pericardium was normal in appearance. There was no pericardial  effusion.  ------------------------------------------------------------------- Systemic veins: Inferior vena cava: The vessel was normal in size. The respirophasic diameter changes were in the normal range (= 50%), consistent with normal central venous pressure.  ------------------------------------------------------------------- Post procedure conclusions Ascending Aorta:  - Severe dilatation of ascending aortic root suggest CTA to   accurately measure.  ------------------------------------------------------------------- Measurements   Left ventricle                           Value        Reference  LV ID, ED, PLAX chordal          (H)     63.7  mm     43 - 52  LV ID, ES, PLAX chordal          (H)     56.5  mm     23 - 38  LV fx shortening, PLAX chordal   (L)  11    %      >=29  LV PW thickness, ED                      9.52  mm     ---------  IVS/LV PW ratio, ED                      0.97         <=1.3  LV e&', lateral                           7.83  cm/s   ---------  LV E/e&', lateral                         6.19         ---------  LV e&', medial                            3.85  cm/s   ---------  LV E/e&', medial                          12.6         ---------  LV e&', average                           5.84  cm/s   ---------  LV E/e&', average                         8.3          ---------    Ventricular septum                       Value        Reference  IVS thickness, ED                        9.19  mm     ---------    LVOT                                     Value        Reference  LVOT peak velocity, S                    134   cm/s   ---------  LVOT mean velocity, S                    98.4  cm/s   ---------  LVOT VTI, S                              27.2  cm     ---------  LVOT peak gradient, S                    7     mm Hg  ---------    Aortic valve                             Value  Reference  Aortic regurg pressure half-time         376   ms      ---------    Aorta                                    Value        Reference  Aortic root ID, ED                       41    mm     ---------  Ascending aorta ID, A-P, S               44    mm     ---------    Left atrium                              Value        Reference  LA ID, A-P, ES                           40    mm     ---------  LA ID/bsa, A-P                           1.48  cm/m^2 <=2.2  LA volume, S                             48.6  ml     ---------  LA volume/bsa, S                         18    ml/m^2 ---------  LA volume, ES, 1-p A4C                   40    ml     ---------  LA volume/bsa, ES, 1-p A4C               14.8  ml/m^2 ---------  LA volume, ES, 1-p A2C                   51.4  ml     ---------  LA volume/bsa, ES, 1-p A2C               19.1  ml/m^2 ---------    Mitral valve                             Value        Reference  Mitral E-wave peak velocity              48.5  cm/s   ---------  Mitral A-wave peak velocity              82.9  cm/s   ---------  Mitral deceleration time                 204   ms     150 - 230  Mitral E/A ratio, peak                   0.6          ---------  Systemic veins                           Value        Reference  Estimated CVP                            3     mm Hg  ---------    Right ventricle                          Value        Reference  TAPSE                                    16.9  mm     ---------  RV s&', lateral, S                        11.8  cm/s   ---------  Legend: (L)  and  (H)  mark values outside specified reference range.  ------------------------------------------------------------------- Prepared and Electronically Authenticated by  Jenkins Rouge, M.D. 2018-08-24T13:47:32 Study Highlights    The left ventricular ejection fraction is moderately decreased (30-44%).  Nuclear stress EF: 39%.  There was no ST segment deviation noted during stress.  No T wave inversion was noted during stress.  This  is an intermediate risk study due to reduced systolic function.  There is no ischemia.    Nuclear History and Indications   History and Indications Indication for Stress Test: Diagnosis of coronary disease History: Hx Acute Bronchitis; No prior cardiac history reported; No prior NUC MPI for comparison. Cardiac Risk Factors: Family History - CAD, History of Smoking, Hypertension, Lipids and Obesity  Symptoms: Dizziness, DOE, Fatigue and SOB  Stress Findings   ECG Baseline ECG exhibits normal sinus rhythm..  Stress Findings A pharmacological stress test was performed using IV Lexiscan 0.4mg  over 10 seconds performed without concurrent submaximal exercise.  The patient reported shortness of breath during the stress test.   Test was stopped per protocol.  Response to Stress There was no ST segment deviation noted during stress.  No T wave inversion was noted during stress. Arrhythmias during stress: frequent PVCs.  Arrhythmias during recovery: none.  Arrhythmias were not significant.  ECG was interpretable and there was no significant change from baseline.  Stress Measurements   Baseline Vitals  Rest HR 63 bpm    Rest BP 141/67 mmHg    Peak Stress Vitals  Peak HR 74 bpm    Peak BP 136/69 mmHg       Nuclear Stress Measurements   LV sys vol 143 mL    TID 0.98     LV dias vol 234 mL    SSS 0     SRS 0     SDS 0          Nuclear Stress Findings   Isotope administration Rest isotope was administered with an IV injection of 31.3 mCi technetium tetrofosmin. Rest SPECT images were obtained approximately 45 minutes post tracer injection. Stress isotope was administered with an IV injection of 29.6 mCi technetium tetrofosmin 20 seconds post IV Lexiscan administration. Stress SPECT images were obtained approximately 60 minutes post tracer injection.  Nuclear Study Quality Overall image quality is good.  Nuclear Measurements Study was  gated.  Rest Perfusion Rest perfusion  normal.  Stress Perfusion Stress perfusion normal.  Overall Study Impression Myocardial perfusion is normal. This is an intermediate risk study. Overall left ventricular systolic function was abnormal. LV cavity size is moderately enlarged. Nuclear stress EF: 39%. The left ventricular ejection fraction is moderately decreased (30-44%). There is no prior study for comparison.  From: ACCF/SCAI/STS/AATS/AHA/ASNC/HFSA/SCCT 2012 Appropriate Use Criteria for Coronary Revascularization Focused Update  Wall Scoring   Score Index: 2.000 Percent Normal: 0.0%          The left ventricular wall motion is globally hypokinetic.          Signed   Electronically signed by Skeet Latch, MD on 02/16/17 at [facility] EDT  Report approved and finalized on 02/16/2017 1918   Aortic Size Index=  4.6        /Body surface area is 2.65 meters squared. 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 4.6 cm ascending thoracic aortic . 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  descending thoracic aorta with maximum measured diameter 4.1 cm.   2/moderate aortic insufficiency  EF 55%, LVED 63, LV ES  56.5 3/moderate risk nuclear stress test.  I discussed with the patient the findings of dilated a sending aorta and the risks and options related to repair versus monitoring.  At this point I would recommend continued monitoring with the size of 4.6 cm maximum.  Of greatest concern is the patient's symptoms of of heart failure with shortness of breath with dyspnea and moderate aortic insufficiency, his heart failure will need to be aggressively medically managed and closely followed for consideration of aortic valve replacement if his symptoms worsen.   I will plan to see the patient back in 6 months with a  follow-up CTA of the chest,      Patient was warned about not using Cipro and similar antibiotics. Recent studies have raised concern that fluoroquinolone antibiotics could be associated with an increased risk of aortic aneurysm Fluoroquinolones have non-antimicrobial properties that might jeopardise the integrity of the extracellular matrix of the vascular wall In a  propensity score matched cohort study in [place], there was a 66% increased rate of aortic aneurysm or dissection associated with oral fluoroquinolone use, compared with amoxicillin use, within a 60 day risk period from start of treatment  I  spent 40 minutes counseling the patient face to face and 50% or more the  time was spent in counseling and coordination of care. The total time spent in the appointment was 60 minutes.  [name] MD      [address] [place] [address] Office [phone]   Beeper [phone]  04/19/2017 3:20 PM

## 2017-05-18 ENCOUNTER — Encounter: Payer: Self-pay | Admitting: Cardiology

## 2017-05-19 ENCOUNTER — Telehealth: Payer: Self-pay

## 2017-05-19 NOTE — Telephone Encounter (Signed)
Received a call from patient.He stated he is still sob.Stated he would like to be seen.Appointment scheduled with Bernerd Pho PA Mon 05/24/17 at 11:30 am.

## 2017-05-24 ENCOUNTER — Ambulatory Visit: Payer: Medicare HMO | Admitting: Student

## 2017-05-27 ENCOUNTER — Ambulatory Visit: Payer: Medicare HMO | Admitting: Physician Assistant

## 2017-05-27 ENCOUNTER — Encounter: Payer: Self-pay | Admitting: Physician Assistant

## 2017-05-27 VITALS — BP 158/80 | HR 62 | Ht 76.0 in | Wt 299.2 lb

## 2017-05-27 DIAGNOSIS — R0609 Other forms of dyspnea: Secondary | ICD-10-CM | POA: Diagnosis not present

## 2017-05-27 DIAGNOSIS — I5033 Acute on chronic diastolic (congestive) heart failure: Secondary | ICD-10-CM | POA: Diagnosis not present

## 2017-05-27 DIAGNOSIS — I10 Essential (primary) hypertension: Secondary | ICD-10-CM | POA: Diagnosis not present

## 2017-05-27 DIAGNOSIS — I712 Thoracic aortic aneurysm, without rupture, unspecified: Secondary | ICD-10-CM

## 2017-05-27 DIAGNOSIS — R0600 Dyspnea, unspecified: Secondary | ICD-10-CM

## 2017-05-27 MED ORDER — SPIRONOLACTONE 25 MG PO TABS: 12.5000 mg | ORAL_TABLET | Freq: Every day | ORAL | 3 refills | Status: DC

## 2017-05-27 NOTE — Progress Notes (Signed)
Cardiology Office Note   Date:  05/27/2017   ID:  Clinton Black 11-15-1945, MRN [id number]  PCP:  [name], MD  Cardiologist: Dr. [name], 03/11/2017 Rosaria Ferries, PA-C   Chief Complaint  Patient presents with  . Follow-up   History of Present Illness: Clinton Black is a 71 y.o. male with a history of DM2, HTN, HLD, morbid obesity, thoracic AA 4.6 mm, BPH, DOE  12/05 phone note regarding dyspnea on exertion and appointment made  Clinton Black presents for cardiology follow up and evaluation.   His last BP was 093 systolic, it goes up and down.  He does not feel his systolic blood pressure is generally elevated.  His weight is up on our scales, but the pt feels this is from heavier clothes and shoes. His weight might be up 2 lbs or so.  His dyspnea on exertion has not changed recently.  He has orthopnea and PND versus sleep apnea because he wakes during the night.  He has some daytime edema but does not feel he is waking with it.  He has seen Dr Servando Snare about his valve and his aorta.  The plan is to aggressively manage CHF and consider AVR if symptoms worsen.  He does not know his A1c. He does not check his sugars. He does not eat a diabetic diet. He works in [employer], admits that he eats poorly, because he eats their food. He has done low-carb diets, says he can do that again. He only works 2 days/week.   He does not wake up with LE edema, but will get some during the day. He has some orthopnea, gets SOB bending over, has PND. He snores at times, more when he takes the Halcion, but sleeps better with it.   He has a wound on his LLE that has been there for years. He is working on it and it has improved recently.  He can walk up a flight of stairs without stopping, but has to do 1 step at a time because of his knees.   He fell a couple of weeks ago and landed on his L side. He was having L CP, but that has improved in the last few days. He has  no hx angina.   His walking is very limited by pain in his ankles and knees. If he walks long enough, the pain will get better, but for short distances it is very painful. He takes Turmeric, but does not feel like it helps.   He admits that his exertion level is poor, he does not walk very much when he takes the dog out.   He snores, has PND, but is not interested in a sleep study.    Past Medical History:  Diagnosis Date  . Arthritis   . BPH (benign prostatic hyperplasia)   . Diabetes mellitus type 2 in obese ([place])   . HTN (hypertension)   . Hyperlipidemia, mixed   . Insomnia   . Obesity     Past Surgical History:  Procedure Laterality Date  . COLONOSCOPY    . CYST EXCISION     polynomial  . KNEE SURGERY  2000  . TONSILLECTOMY    . TONSILLECTOMY    . widson teeth extraction      Current Outpatient Medications  Medication Sig Dispense Refill  . aspirin 81 MG tablet Take 1 tablet (81 mg total) by mouth daily. 90 tablet 3  . atorvastatin (LIPITOR)  20 MG tablet Take 1 tablet (20 mg total) by mouth daily. 90 tablet 0  . finasteride (PROSCAR) 5 MG tablet Take 5 mg by mouth daily.    Marland Kitchen losartan (COZAAR) 50 MG tablet Take 1 tablet (50 mg total) by mouth daily. 90 tablet 3  . metoprolol succinate (TOPROL XL) 25 MG 24 hr tablet Take 1 tablet (25 mg total) by mouth daily. 30 tablet 6  . triazolam (HALCION) 0.25 MG tablet TAKE 1 TABLET BY MOUTH AT BEDTIME AS NEEDED 30 tablet 2   No current facility-administered medications for this visit.     Allergies:   Patient has no known allergies.    Social History:  The patient  reports that he quit smoking about 22 years ago. he has never used smokeless tobacco. He reports that he drinks about 8.4 oz of alcohol per week. He reports that he uses drugs.   Family History:  The patient's family history includes Aneurysm in his father; Arthritis in his mother; Heart disease in his father; Macular degeneration in his mother; Varicose Veins in  his mother.    ROS:  Please see the history of present illness. All other systems are reviewed and negative.    PHYSICAL EXAM: VS:  BP (!) 158/80   Pulse 62   Ht 6\' 4"  (1.93 m)   Wt 299 lb 3.2 oz (135.7 kg)   BMI 36.42 kg/m  , BMI Body mass index is 36.42 kg/m. GEN: Well nourished, well developed, male in no acute distress  HEENT: normal for age  Neck: JVD 8-9 cm, difficult to assess secondary to body habitus, no carotid bruit, no masses Cardiac: RRR; soft murmur, no rubs, or gallops Respiratory: Decreased breath sounds bases bilaterally, normal work of breathing GI: soft, nontender, nondistended, + BS MS: no deformity or atrophy; trace pedal edema; distal pulses are 2+ in all 4 extremities   Skin: warm and dry, no rash Neuro:  Strength and sensation are intact Psych: euthymic mood, full affect   EKG:  EKG is ordered today. The ekg ordered today demonstrates sinus rhythm, heart rate 62, inverted T waves in lead III that are not new, no new is ischemic changes.  ECHO: 02/05/2017 - Left ventricle: The cavity size was mildly dilated. Wall   thickness was normal. Systolic function was normal. The estimated   ejection fraction was 55%. Wall motion was normal; there were no   regional wall motion abnormalities. The study is not technically   sufficient to allow evaluation of LV diastolic function. - Aortic valve: There was moderate regurgitation. - Aorta: Severe dilatation of ascending aortic root suggest CTA to   accurately measure. - Left atrium: The atrium was mildly dilated. - Atrial septum: No defect or patent foramen ovale was identified.  MYOVIEW: 02/12/2017  The left ventricular ejection fraction is moderately decreased (30-44%).  Nuclear stress EF: 39%.  There was no ST segment deviation noted during stress.  No T wave inversion was noted during stress.  This is an intermediate risk study due to reduced systolic function.  There is no ischemia.  Recent  Labs: 12/24/2016: Hemoglobin 14.6; Platelets 171.0; Pro B Natriuretic peptide (BNP) 130.0; TSH 2.72 03/11/2017: ALT 31; BUN 18; Creatinine, Ser 0.82; Potassium 4.2; Sodium 143    Lipid Panel    Component Value Date/Time   CHOL 105 03/11/2017 1541   TRIG 115 03/11/2017 1541   HDL 36 (L) 03/11/2017 1541   CHOLHDL 4 05/11/2016 1433   VLDL 46.8 (H) 05/11/2016  1433   LDLCALC 46 03/11/2017 1541   LDLDIRECT 120.0 05/11/2016 1433     Wt Readings from Last 3 Encounters:  05/27/17 299 lb 3.2 oz (135.7 kg)  04/19/17 289 lb (131.1 kg)  03/11/17 295 lb (133.8 kg)     Other studies Reviewed: Additional studies/ records that were reviewed today include: Office notes, hospital records and testing.  ASSESSMENT AND PLAN:  1.  Acute on chronic diastolic CHF: He does have some volume overload on exam.  He is reluctant to take a diuretic but I encouraged him to do so because a set of felt like he would breathe better.  He is agreeable to taking Spironolactone 12.5 mg daily.  This can be increased as needed for better volume control.  He is encouraged to be aware of the sodium in his foods.  I will check an echocardiogram to make sure his PAS is not significantly elevated, and his EF has not decreased.  2.  Hypertension: His blood pressure was recheckejod and the systolic was 174.  He is tolerating a low dose of metoprolol, but his heart rate is in the 50s at times, so we will not increase it.  If he tolerates the Spironolactone well but that does not make the desired improvement in his blood pressure, increase the Cozaar as long as his renal function and electrolytes are okay.  3. 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 Servando Snare   Current medicines are reviewed at length with the patient today.  The patient does not have concerns regarding medicines.  The following changes have been made: Add Spironolactone  Labs/ tests ordered today include:   Orders Placed This Encounter  Procedures  .  EKG 12-Lead     Disposition:   FU with Dr. [name]  Signed, Rosaria Ferries, PA-C  05/27/2017 12:04 PM    [place] Phone: [phone]; Fax: [phone]  This note was written with the assistance of speech recognition software. Please excuse any transcriptional errors.

## 2017-05-27 NOTE — Patient Instructions (Signed)
Medication Instructions:  START spironolactone 12.5 mg (1/2 tablet) daily-prescription sent to pharmacy.  Labwork: (BMET)-when you have your Echo (there is a lab at the [address] office, you can have this drawn there)  Testing/Procedures: Your physician has requested that you have an echocardiogram. Echocardiography is a painless test that uses sound waves to create images of your heart. It provides your doctor with information about the size and shape of your heart and how well your heart's chambers and valves are working. This procedure takes approximately one hour. There are no restrictions for this procedure. This will be done at our [facility] location:  [place]: Your physician recommends that you schedule a follow-up appointment in: after echo with Rosaria Ferries PA   Any Other Special Instructions Will Be Listed Below (If Applicable).  Diabetes Mellitus and Food It is important for you to manage your blood sugar (glucose) level. Your blood glucose level can be greatly affected by what you eat. Eating healthier foods in the appropriate amounts throughout the day at about the same time each day will help you control your blood glucose level. It can also help slow or prevent worsening of your diabetes mellitus. Healthy eating may even help you improve the level of your blood pressure and reach or maintain a healthy weight. General recommendations for healthful eating and cooking habits include:  Eating meals and snacks regularly. Avoid going long periods of time without eating to lose weight.  Eating a diet that consists mainly of plant-based foods, such as fruits, vegetables, nuts, legumes, and whole grains.  Using low-heat cooking methods, such as baking, instead of high-heat cooking methods, such as deep frying.  Work with your dietitian to make sure you understand how to use the Nutrition Facts information on food labels. How can food affect  me? Carbohydrates Carbohydrates affect your blood glucose level more than any other type of food. Your dietitian will help you determine how many carbohydrates to eat at each meal and teach you how to count carbohydrates. Counting carbohydrates is important to keep your blood glucose at a healthy level, especially if you are using insulin or taking certain medicines for diabetes mellitus. Alcohol Alcohol can cause sudden decreases in blood glucose (hypoglycemia), especially if you use insulin or take certain medicines for diabetes mellitus. Hypoglycemia can be a life-threatening condition. Symptoms of hypoglycemia (sleepiness, dizziness, and disorientation) are similar to symptoms of having too much alcohol. If your health care provider has given you approval to drink alcohol, do so in moderation and use the following guidelines:  Women should not have more than one drink per day, and men should not have more than two drinks per day. One drink is equal to: ? 12 oz of beer. ? 5 oz of wine. ? 1 oz of hard liquor.  Do not drink on an empty stomach.  Keep yourself hydrated. Have water, diet soda, or unsweetened iced tea.  Regular soda, juice, and other mixers might contain a lot of carbohydrates and should be counted.  What foods are not recommended? As you make food choices, it is important to remember that all foods are not the same. Some foods have fewer nutrients per serving than other foods, even though they might have the same number of calories or carbohydrates. It is difficult to get your body what it needs when you eat foods with fewer nutrients. Examples of foods that you should avoid that are high in calories and carbohydrates  but low in nutrients include:  Trans fats (most processed foods list trans fats on the Nutrition Facts label).  Regular soda.  Juice.  Candy.  Sweets, such as cake, pie, doughnuts, and cookies.  Fried foods.  What foods can I eat? Eat nutrient-rich  foods, which will nourish your body and keep you healthy. The food you should eat also will depend on several factors, including:  The calories you need.  The medicines you take.  Your weight.  Your blood glucose level.  Your blood pressure level.  Your cholesterol level.  You should eat a variety of foods, including:  Protein. ? Lean cuts of meat. ? Proteins low in saturated fats, such as fish, egg whites, and beans. Avoid processed meats.  Fruits and vegetables. ? Fruits and vegetables that may help control blood glucose levels, such as apples, mangoes, and yams.  Dairy products. ? Choose fat-free or low-fat dairy products, such as milk, yogurt, and cheese.  Grains, bread, pasta, and rice. ? Choose whole grain products, such as multigrain bread, whole oats, and brown rice. These foods may help control blood pressure.  Fats. ? Foods containing healthful fats, such as nuts, avocado, olive oil, canola oil, and fish.  Does everyone with diabetes mellitus have the same meal plan? Because every person with diabetes mellitus is different, there is not one meal plan that works for everyone. It is very important that you meet with a dietitian who will help you create a meal plan that is just right for you. This information is not intended to replace advice given to you by your health care provider. Make sure you discuss any questions you have with your health care provider. Document Released: 02/26/2005 Document Revised: 11/07/2015 Document Reviewed: 04/28/2013 Elsevier Interactive Patient Education  2017 [employer].    If you need a refill on your cardiac medications before your next appointment, please call your pharmacy.

## 2017-06-03 ENCOUNTER — Other Ambulatory Visit: Payer: Self-pay

## 2017-06-03 ENCOUNTER — Ambulatory Visit (HOSPITAL_COMMUNITY): Payer: Medicare HMO | Attending: Cardiovascular Disease

## 2017-06-03 DIAGNOSIS — I712 Thoracic aortic aneurysm, without rupture: Secondary | ICD-10-CM | POA: Insufficient documentation

## 2017-06-03 DIAGNOSIS — Z6836 Body mass index (BMI) 36.0-36.9, adult: Secondary | ICD-10-CM | POA: Insufficient documentation

## 2017-06-03 DIAGNOSIS — E785 Hyperlipidemia, unspecified: Secondary | ICD-10-CM | POA: Insufficient documentation

## 2017-06-03 DIAGNOSIS — I110 Hypertensive heart disease with heart failure: Secondary | ICD-10-CM | POA: Insufficient documentation

## 2017-06-03 DIAGNOSIS — I082 Rheumatic disorders of both aortic and tricuspid valves: Secondary | ICD-10-CM | POA: Insufficient documentation

## 2017-06-03 DIAGNOSIS — I509 Heart failure, unspecified: Secondary | ICD-10-CM | POA: Insufficient documentation

## 2017-06-03 DIAGNOSIS — E669 Obesity, unspecified: Secondary | ICD-10-CM | POA: Insufficient documentation

## 2017-06-03 DIAGNOSIS — R0600 Dyspnea, unspecified: Secondary | ICD-10-CM

## 2017-06-03 DIAGNOSIS — R0609 Other forms of dyspnea: Secondary | ICD-10-CM | POA: Diagnosis not present

## 2017-07-02 ENCOUNTER — Encounter: Payer: Self-pay | Admitting: *Deleted

## 2017-07-05 NOTE — Progress Notes (Addendum)
Cardiology Office Note   Date:  07/06/2017   ID:  Clinton, Black Sep 27, 1945, MRN [id number]  PCP:  [name], MD  Cardiologist:  Dr. [name]  Chief Complaint  Patient presents with  . Follow-up  . Edema    Ankles.    History of Present Illness: Clinton Black is a 72 y.o. male with a history of DM2, HTN, HLD, morbid obesity, thoracic AA 4.6 mm, BPH, and DOE.  His last office visit was on 05/27/2017. He reported orthopnea, PND, and dyspnea on exertion at that time and appeared volume overloaded on exam. He was reluctant to starting a diaretic but agreed to starting Spironolactone 12.5mg  daily. An Echo was order, which showed mild LVH with an EF of 50-55% and distal septal hypokinesis.   Clinton Black presents for follow up.  His shortness of breath has greatly improved since his last visit. He still gets short of breath if he walks up 2 flights of steps but he can walk 1/2 mile without any problems as long as it is not on an incline. He still notes occasional orthopnea but states it has improved. He has not had any episodes of PND since his last visit. No LE edema.   He is monitoring his weight daily at home, and he is watching is sodium intake. He is not adding any salt to his foods and he is trying to avoid processed foods. He is no longer working at the [employer] which has helped. Today, he weighed 290.6 on his scales, his weight has been trending down steadily.   He has some dizziness if he stands up too quickly but states it quickly resolves. He has never fallen or passed out. He denies any chest pain.    Past Medical History:  Diagnosis Date  . Arthritis   . BPH (benign prostatic hyperplasia)   . Diabetes mellitus type 2 in obese ([place])   . HTN (hypertension)   . Hyperlipidemia, mixed   . Insomnia   . Obesity     Past Surgical History:  Procedure Laterality Date  . COLONOSCOPY    . CYST EXCISION     polynomial  . KNEE SURGERY  2000  .  TONSILLECTOMY    . widson teeth extraction      Current Outpatient Medications  Medication Sig Dispense Refill  . aspirin 81 MG tablet Take 1 tablet (81 mg total) by mouth daily. 90 tablet 3  . atorvastatin (LIPITOR) 20 MG tablet Take 1 tablet (20 mg total) by mouth daily. 90 tablet 0  . finasteride (PROSCAR) 5 MG tablet Take 5 mg by mouth daily.    Marland Kitchen losartan (COZAAR) 50 MG tablet Take 1 tablet (50 mg total) by mouth daily. 90 tablet 3  . metoprolol succinate (TOPROL XL) 25 MG 24 hr tablet Take 1 tablet (25 mg total) by mouth daily. 30 tablet 6  . spironolactone (ALDACTONE) 25 MG tablet Take 0.5 tablets (12.5 mg total) by mouth daily. 45 tablet 3  . triazolam (HALCION) 0.25 MG tablet TAKE 1 TABLET BY MOUTH AT BEDTIME AS NEEDED 30 tablet 2   No current facility-administered medications for this visit.     Allergies:   Patient has no known allergies.    Social History:  The patient  reports that he quit smoking about 23 years ago. he has never used smokeless tobacco. He reports that he drinks about 8.4 oz of alcohol per week. He reports that he uses  drugs.   Family History:  The patient's family history includes Aneurysm in his father; Arthritis in his mother; Heart disease in his father; Macular degeneration in his mother; Varicose Veins in his mother.    ROS:  Please see the history of present illness. All other systems are reviewed and negative.    PHYSICAL EXAM: VS:  BP (!) 116/52   Pulse 60   Ht 6\' 4"  (1.93 m)   Wt 291 lb (132 kg)   BMI 35.42 kg/m  , BMI Body mass index is 35.42 kg/m. GEN: Well nourished, well developed, male in no acute distress  HEENT: normal for age  Neck: minimal JVD, no carotid bruit, no masses Cardiac: RRR; soft murmur, no rubs, or gallops Respiratory:  clear to auscultation bilaterally, normal work of breathing GI: soft, nontender, nondistended, + BS MS: no deformity or atrophy; no edema; distal pulses are 2+ in all 4 extremities   Skin: warm and  dry, no rash Neuro:  Strength and sensation are intact Psych: euthymic mood, full affect   EKG:  EKG is not ordered today.  EKG 05/27/2017 demonstrated sinus rhythm, heart rate 62, inverted T waves in lead III that are not new, no new is ischemic changes.   ECHO 02/05/2017: - Left ventricle: The cavity size was mildly dilated. Wall thickness was normal. Systolic function was normal. The estimated ejection fraction was 55%. Wall motion was normal; there were no regional wall motion abnormalities. The study is not technically sufficient to allow evaluation of LV diastolic function. - Aortic valve: There was moderate regurgitation. - Aorta: Severe dilatation of ascending aortic root suggest CTA to accurately measure. - Left atrium: The atrium was mildly dilated. - Atrial septum: No defect or patent foramen ovale was identified.  MYOVIEW 02/12/2017:  The left ventricular ejection fraction is moderately decreased (30-44%).  Nuclear stress EF: 39%.  There was no ST segment deviation noted during stress.  No T wave inversion was noted during stress.  This is an intermediate risk study due to reduced systolic function.  There is no ischemia.  ECHO 06/03/2017: Study Conclusions: - Left ventricle: Distal septal hypokinesis. The cavity size was   mildly dilated. Wall thickness was increased in a pattern of mild   LVH. Systolic function was normal. The estimated ejection   fraction was in the range of 50% to 55%. Left ventricular   diastolic function parameters were normal. - Aortic valve: No well seen likely tri leaflet. There was mild   regurgitation. - Aorta: Severe aortic root dilatation suggest f/u CTA / MRA to   evaluate - Left atrium: The atrium was mildly dilated. - Atrial septum: No defect or patent foramen ovale was identified.  Recent Labs: 12/24/2016: Hemoglobin 14.6; Platelets 171.0; Pro B Natriuretic peptide (BNP) 130.0; TSH 2.72 03/11/2017: ALT 31; BUN  18; Creatinine, Ser 0.82; Potassium 4.2; Sodium 143    Lipid Panel    Component Value Date/Time   CHOL 105 03/11/2017 1541   TRIG 115 03/11/2017 1541   HDL 36 (L) 03/11/2017 1541   CHOLHDL 4 05/11/2016 1433   VLDL 46.8 (H) 05/11/2016 1433   LDLCALC 46 03/11/2017 1541   LDLDIRECT 120.0 05/11/2016 1433     Wt Readings from Last 3 Encounters:  07/06/17 291 lb (132 kg)  05/27/17 299 lb 3.2 oz (135.7 kg)  04/19/17 289 lb (131.1 kg)     Other studies Reviewed: Additional studies/ records that were reviewed today include: office notes, hospital records and testing.  ASSESSMENT  AND PLAN:  1.  Chronic Diastolic CHF -  Patient dyspnea has improved since last visit. He still has occasional orthopnea but no PND or lower leg edema.  - Continue medications which include Cozaar 50mg , Metoprolol succinate 25mg  daily, Spironolactone 25mg  daily, Lipitor 20mg  daily, and Aspirin 81mg  daily.  - Patient to continue monitoring morning weights and watching sodium intake.   2. Hypertension  - BP 116/52 today. - Continue medications.   3. Thoracic Aortic Aneurysm without Rupture - 4.6cm ascending aneurysm - F/U with Dr. Servando Snare as previously scheduled   Current medicines are reviewed at length with the patient today.  The patient has concerns regarding medicines. Patient is concerned about taking a statin and worried that it will cause a decrease in his muscle mass. Reassured patient that he is on a low dose of Lipitor. Encouraged him to continue staying activity to help maintain muscle mass. Patient wants to try herbal remedies in addition to the statin.  The following changes have been made:  no change  Labs/ tests ordered today include: None No orders of the defined types were placed in this encounter.    Disposition:   FU with Dr. [name] in 3 months  Signed, Clinton Smothers, PA-C  07/06/2017 5:12 PM    [place] Phone: [phone]; Fax: [phone]  This note was written with the assistance of speech recognition software. Please excuse any transcriptional errors.

## 2017-07-06 ENCOUNTER — Encounter: Payer: Self-pay | Admitting: Physician Assistant

## 2017-07-06 ENCOUNTER — Ambulatory Visit: Payer: Medicare HMO | Admitting: Physician Assistant

## 2017-07-06 VITALS — BP 116/52 | HR 60 | Ht 76.0 in | Wt 291.0 lb

## 2017-07-06 DIAGNOSIS — I10 Essential (primary) hypertension: Secondary | ICD-10-CM

## 2017-07-06 DIAGNOSIS — I712 Thoracic aortic aneurysm, without rupture, unspecified: Secondary | ICD-10-CM

## 2017-07-06 DIAGNOSIS — I5033 Acute on chronic diastolic (congestive) heart failure: Secondary | ICD-10-CM | POA: Diagnosis not present

## 2017-07-06 DIAGNOSIS — I5032 Chronic diastolic (congestive) heart failure: Secondary | ICD-10-CM | POA: Diagnosis not present

## 2017-07-06 NOTE — Addendum Note (Signed)
Addended by: Ulice Brilliant T on: 07/06/2017 11:32 AM   Modules accepted: Orders

## 2017-07-06 NOTE — Patient Instructions (Addendum)
Medication Instructions:  Your physician recommends that you continue on your current medications as directed. Please refer to the Current Medication list given to you today.  Labwork: Your physician recommends that you return for lab work in: TODAY-BMET  Testing/Procedures: None   Follow-Up: Your physician recommends that you schedule a follow-up appointment in: 3MONTHS with DR [name]  Any Other Special Instructions Will Be Listed Below (If Applicable).  If you need a refill on your cardiac medications before your next appointment, please call your pharmacy.

## 2017-07-07 LAB — BASIC METABOLIC PANEL
BUN/Creatinine Ratio: 22 (ref 10–24)
BUN: 17 mg/dL (ref 8–27)
CO2: 21 mmol/L (ref 20–29)
Calcium: 9.2 mg/dL (ref 8.6–10.2)
Chloride: 105 mmol/L (ref 96–106)
Creatinine, Ser: 0.76 mg/dL (ref 0.76–1.27)
GFR calc Af Amer: 107 mL/min/{1.73_m2} (ref >59)
GFR calc non Af Amer: 92 mL/min/{1.73_m2} (ref >59)
Glucose: 126 mg/dL — ABNORMAL HIGH (ref 65–99)
Potassium: 4.4 mmol/L (ref 3.5–5.2)
Sodium: 141 mmol/L (ref 134–144)

## 2017-07-13 ENCOUNTER — Other Ambulatory Visit: Payer: Self-pay | Admitting: Internal Medicine

## 2017-07-13 DIAGNOSIS — G4700 Insomnia, unspecified: Secondary | ICD-10-CM

## 2017-07-13 DIAGNOSIS — G4730 Sleep apnea, unspecified: Principal | ICD-10-CM

## 2017-07-14 ENCOUNTER — Other Ambulatory Visit: Payer: Self-pay | Admitting: Internal Medicine

## 2017-07-14 DIAGNOSIS — G4730 Sleep apnea, unspecified: Principal | ICD-10-CM

## 2017-07-14 DIAGNOSIS — G4700 Insomnia, unspecified: Secondary | ICD-10-CM

## 2017-07-15 ENCOUNTER — Ambulatory Visit: Payer: Medicare HMO | Admitting: Internal Medicine

## 2017-07-15 ENCOUNTER — Encounter: Payer: Self-pay | Admitting: Internal Medicine

## 2017-07-15 VITALS — BP 112/80 | HR 54 | Temp 97.4°F | Ht 76.0 in | Wt 290.8 lb

## 2017-07-15 DIAGNOSIS — G4700 Insomnia, unspecified: Secondary | ICD-10-CM

## 2017-07-15 DIAGNOSIS — I739 Peripheral vascular disease, unspecified: Secondary | ICD-10-CM | POA: Diagnosis not present

## 2017-07-15 DIAGNOSIS — Z23 Encounter for immunization: Secondary | ICD-10-CM

## 2017-07-15 DIAGNOSIS — G4730 Sleep apnea, unspecified: Secondary | ICD-10-CM

## 2017-07-15 DIAGNOSIS — E118 Type 2 diabetes mellitus with unspecified complications: Secondary | ICD-10-CM | POA: Diagnosis not present

## 2017-07-15 LAB — POCT GLYCOSYLATED HEMOGLOBIN (HGB A1C): Hemoglobin A1C: 6.8

## 2017-07-15 LAB — GLUCOSE, POCT (MANUAL RESULT ENTRY): POC Glucose: 101 mg/dl — AB (ref 70–99)

## 2017-07-15 MED ORDER — TRIAZOLAM 0.25 MG PO TABS: 0.2500 mg | ORAL_TABLET | Freq: Every evening | ORAL | 3 refills | Status: DC | PRN

## 2017-07-15 NOTE — Progress Notes (Signed)
Subjective:  Patient ID: Clinton Black, male    DOB: [birth year]  Age: 72 y.o. MRN: [id number]  CC: Diabetes   HPI Clinton Black presents for f/up - He feels much better over the last few months.  He has been working on his lifestyle modifications and tells me that he has lost weight.  He tells me his blood pressure and blood sugar have been well controlled.  His breathing has been better over the last few months.  He denies any recent episodes of cough, shortness of breath, wheezing, edema, or weight gain.  He does complain of lower extremity pain that sounds like claudication and says his feet feel cold.  Outpatient Medications Prior to Visit  Medication Sig Dispense Refill  . aspirin 81 MG tablet Take 1 tablet (81 mg total) by mouth daily. 90 tablet 3  . atorvastatin (LIPITOR) 20 MG tablet Take 1 tablet (20 mg total) by mouth daily. 90 tablet 0  . finasteride (PROSCAR) 5 MG tablet Take 5 mg by mouth daily.    Marland Kitchen losartan (COZAAR) 50 MG tablet Take 1 tablet (50 mg total) by mouth daily. 90 tablet 3  . metoprolol succinate (TOPROL XL) 25 MG 24 hr tablet Take 1 tablet (25 mg total) by mouth daily. 30 tablet 6  . spironolactone (ALDACTONE) 25 MG tablet Take 0.5 tablets (12.5 mg total) by mouth daily. 45 tablet 3  . triazolam (HALCION) 0.25 MG tablet TAKE 1 TABLET BY MOUTH AT BEDTIME AS NEEDED 30 tablet 2   No facility-administered medications prior to visit.     ROS Review of Systems  Constitutional: Negative.  Negative for appetite change, diaphoresis, fatigue and unexpected weight change.  HENT: Negative.  Negative for trouble swallowing.   Eyes: Negative for visual disturbance.  Respiratory: Negative for cough, chest tightness, shortness of breath and wheezing.   Gastrointestinal: Negative for abdominal pain, constipation, diarrhea, nausea and vomiting.  Endocrine: Negative for polydipsia, polyphagia and polyuria.  Genitourinary: Negative.  Negative for decreased urine volume,  difficulty urinating and urgency.  Musculoskeletal: Negative.  Negative for myalgias.  Skin: Negative.  Negative for color change and rash.  Allergic/Immunologic: Negative.   Neurological: Negative.  Negative for dizziness, weakness and light-headedness.  Hematological: Negative for adenopathy. Does not bruise/bleed easily.  Psychiatric/Behavioral: Positive for sleep disturbance. Negative for confusion, decreased concentration, dysphoric mood and suicidal ideas. The patient is not nervous/anxious.     Objective:  BP 112/80 (BP Location: Left Arm, Patient Position: Sitting, Cuff Size: Large)   Pulse (!) 54   Temp (!) 97.4 F (36.3 C) (Oral)   Ht 6\' 4"  (1.93 m)   Wt 290 lb 12 oz (131.9 kg)   SpO2 99%   BMI 35.39 kg/m   BP Readings from Last 3 Encounters:  07/15/17 112/80  07/06/17 (!) 116/52  05/27/17 (!) 158/80    Wt Readings from Last 3 Encounters:  07/15/17 290 lb 12 oz (131.9 kg)  07/06/17 291 lb (132 kg)  05/27/17 299 lb 3.2 oz (135.7 kg)    Physical Exam  Constitutional: He is oriented to person, place, and time. No distress.  HENT:  Mouth/Throat: Oropharynx is clear and moist. No oropharyngeal exudate.  Eyes: Conjunctivae are normal. Left eye exhibits no discharge. No scleral icterus.  Neck: Normal range of motion. Neck supple. No JVD present. No thyromegaly present.  Cardiovascular: Normal rate, regular rhythm and normal heart sounds. Exam reveals no gallop.  No murmur heard. Pulmonary/Chest: Effort normal and breath sounds  normal. No respiratory distress. He has no wheezes. He has no rales.  Abdominal: Soft. Bowel sounds are normal. He exhibits no distension and no mass. There is no tenderness. There is no guarding.  Musculoskeletal: Normal range of motion. He exhibits no edema, tenderness or deformity.  Lymphadenopathy:    He has no cervical adenopathy.  Neurological: He is alert and oriented to person, place, and time.  Skin: Skin is warm and dry. No rash noted.  He is not diaphoretic. No erythema. No pallor.  Vitals reviewed.   Lab Results  Component Value Date   WBC 7.6 12/24/2016   HGB 14.6 12/24/2016   HCT 43.4 12/24/2016   PLT 171.0 12/24/2016   GLUCOSE 126 (H) 07/06/2017   CHOL 105 03/11/2017   TRIG 115 03/11/2017   HDL 36 (L) 03/11/2017   LDLDIRECT 120.0 05/11/2016   LDLCALC 46 03/11/2017   ALT 31 03/11/2017   AST 19 03/11/2017   NA 141 07/06/2017   K 4.4 07/06/2017   CL 105 07/06/2017   CREATININE 0.76 07/06/2017   BUN 17 07/06/2017   CO2 21 07/06/2017   TSH 2.72 12/24/2016   PSA 2.28 10/22/2015   HGBA1C 6.8 07/15/2017   MICROALBUR 1.3 01/04/2017    Ct Angio Chest Aorta W &/or Wo Contrast  Result Date: 02/26/2017 CLINICAL DATA:  Aortic root dilatation. EXAM: CT ANGIOGRAPHY CHEST WITH CONTRAST TECHNIQUE: Multidetector CT imaging of the chest was performed using the standard protocol during bolus administration of intravenous contrast. Multiplanar CT image reconstructions and MIPs were obtained to evaluate the vascular anatomy. CONTRAST:  100 mL of Isovue 370 intravenously. COMPARISON:  None. FINDINGS: Cardiovascular: Aortic root measures 3.9 cm in diameter. 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 Aneurysmal dilatation of descending thoracic aorta is noted with maximum measured diameter 4.1 cm. Aortic atherosclerosis is noted without dissection. Great vessels are widely patent without stenosis. No pericardial effusion is noted. Mediastinum/Nodes: No enlarged mediastinal, hilar, or axillary lymph nodes. Thyroid gland, trachea, and esophagus demonstrate no significant findings. Lungs/Pleura: Lungs are clear. No pleural effusion or pneumothorax. Upper Abdomen: Probable fatty infiltration of the liver is noted. Musculoskeletal: 8.6 x 5.3 cm fat containing lesion is seen within the right pectoralis muscle most consistent with intramuscular lipoma. Review of the MIP images confirms the above  findings. 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 Recommend semi-annual imaging followup by CTA or MRA and referral to cardiothoracic surgery if not already obtained. This recommendation follows 2010 ACCF/AHA/AATS/ACR/ASA/SCA/SCAI/SIR/STS/SVM Guidelines for the Diagnosis and Management of Patients With Thoracic Aortic Disease. Circulation. 2010; 121: A263-F354. Probable fatty infiltration of the liver. 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 Aortic Atherosclerosis (ICD10-I70.0). Electronically Signed   By: Marijo Conception, M.D.   On: 02/26/2017 11:57    Assessment & Plan:   Clinton Black was seen today for diabetes.  Diagnoses and all orders for this visit:  Need for pneumococcal vaccination -     Pneumococcal polysaccharide vaccine 23-valent greater than or equal to 2yo subcutaneous/IM  Claudication of both lower extremities ([place])- He complains of lower extremity pain and I cannot feel the pulses in his feet.  I have asked him to undergo ABIs to see if he has significant PAD. -     VAS [place] ABI WITH/WO TBI; Future  Insomnia w/ sleep apnea -     triazolam (HALCION) 0.25 MG tablet; Take 1 tablet (0.25 mg total) by mouth at  bedtime as needed.  Type 2 diabetes mellitus with complication, without long-term current use of insulin ([place])- His A1c is down to 6.8%.  His blood sugars are adequately well controlled.  He was praised for improving his lifestyle modifications. -     POCT Glucose (CBG) -     POCT HgB A1C -     Ambulatory referral to Ophthalmology   I have changed Clinton Black's triazolam. I am also having him maintain his finasteride, aspirin, atorvastatin, metoprolol succinate, losartan, and spironolactone.  Meds ordered this encounter  Medications  . triazolam (HALCION) 0.25 MG tablet    Sig: Take 1 tablet (0.25 mg total) by mouth at bedtime as needed.    Dispense:  30 tablet    Refill:  3    Not to exceed 3 additional fills  before 06/22/2017     Follow-up: Return in about 4 months (around 11/12/2017).  Scarlette Calico, MD

## 2017-07-15 NOTE — Patient Instructions (Signed)

## 2017-07-18 ENCOUNTER — Other Ambulatory Visit: Payer: Self-pay | Admitting: Internal Medicine

## 2017-07-18 DIAGNOSIS — E785 Hyperlipidemia, unspecified: Secondary | ICD-10-CM

## 2017-07-18 DIAGNOSIS — E118 Type 2 diabetes mellitus with unspecified complications: Secondary | ICD-10-CM

## 2017-07-19 ENCOUNTER — Ambulatory Visit: Payer: Medicare HMO | Admitting: Internal Medicine

## 2017-07-26 ENCOUNTER — Ambulatory Visit (HOSPITAL_COMMUNITY)
Admission: RE | Admit: 2017-07-26 | Discharge: 2017-07-26 | Disposition: A | Discharge status: Home / Self Care | Payer: Medicare HMO | Source: Ambulatory Visit | Attending: Surgery | Admitting: Surgery

## 2017-07-26 DIAGNOSIS — I739 Peripheral vascular disease, unspecified: Secondary | ICD-10-CM | POA: Diagnosis not present

## 2017-07-27 ENCOUNTER — Encounter: Payer: Self-pay | Admitting: Internal Medicine

## 2017-08-03 DIAGNOSIS — D4981 Neoplasm of unspecified behavior of retina and choroid: Secondary | ICD-10-CM | POA: Diagnosis not present

## 2017-08-03 DIAGNOSIS — E113292 Type 2 diabetes mellitus with mild nonproliferative diabetic retinopathy without macular edema, left eye: Secondary | ICD-10-CM | POA: Diagnosis not present

## 2017-08-03 DIAGNOSIS — H04123 Dry eye syndrome of bilateral lacrimal glands: Secondary | ICD-10-CM | POA: Diagnosis not present

## 2017-08-03 DIAGNOSIS — H35033 Hypertensive retinopathy, bilateral: Secondary | ICD-10-CM | POA: Diagnosis not present

## 2017-08-03 LAB — HM DIABETES EYE EXAM

## 2017-08-24 ENCOUNTER — Encounter: Payer: Self-pay | Admitting: Internal Medicine

## 2017-08-24 NOTE — Progress Notes (Signed)
Result abstracted and sent to scan ° °

## 2017-09-17 ENCOUNTER — Other Ambulatory Visit: Payer: Self-pay | Admitting: Cardiology

## 2017-09-17 NOTE — Telephone Encounter (Signed)
Rx has been sent to the pharmacy electronically. ° °

## 2017-09-27 ENCOUNTER — Other Ambulatory Visit: Payer: Self-pay | Admitting: *Deleted

## 2017-09-27 DIAGNOSIS — I712 Thoracic aortic aneurysm, without rupture, unspecified: Secondary | ICD-10-CM

## 2017-09-27 NOTE — Progress Notes (Signed)
Cardiology Office Note   Date:  09/29/2017   ID:  Clinton, Black 1945-10-04, MRN [id number]  PCP:  [name], MD  Cardiologist:    [name], MD   Chief Complaint  Patient presents with  . Shortness of Breath      History of Present Illness: Clinton Black is a 72 y.o. male who is seen for follow up DOE and thoracic aneurysm. He has a history of DM type 2, morbid obesity, HLD, and HTN.   In 2018 he was experiencing dyspnea, often awakening him at night. He states that since he stopped smoking marijuana this has largely resolved.  He was seen by Dr. Ronnald Ramp and noted to have frequent ectopy. Ecg showed PVC couplets. He was hypertensive and started on Edarbi.  Denied any chest pain or syncope. No edema. Quit smoking in 1996. Has lost 25-30 lbs this past year. He does have a family history of CAD.   On  his initial visit he underwent evaluation with an Echocardiogram that showed "normal" LV function with moderate AI and dilated aortic root at 44 mm. On my review I felt his EF was mildly impaired at 45%. Myoview study showed a fixed inferobasal defect, no ischemia. EF 39%. He was started on Toprol XL and a CT of the aorta was ordered. 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   He was seen in December with increased PND. Started on aldactone with clinical improvement. Also on metoprolol and Black. Repeat Echo done and was stable. Reported low normal EF and only "mild" AI.   On follow up today he notes he retired in January. Walking his dog daily. Still has dyspnea at night but now only once a night. Has bladder dysfunction with nocturia 2-3 times a night. No chest pain. Since retirement feet don't hurt as much and he can walk further. He has lost 3 lbs.  Past Medical History:  Diagnosis Date  . Arthritis   . BPH (benign prostatic hyperplasia)   . Diabetes mellitus type 2 in obese ([place])   . HTN (hypertension)   .  Hyperlipidemia, mixed   . Insomnia   . Obesity     Past Surgical History:  Procedure Laterality Date  . COLONOSCOPY    . CYST EXCISION     polynomial  . KNEE SURGERY  2000  . TONSILLECTOMY    . widson teeth extraction       Current Outpatient Medications  Medication Sig Dispense Refill  . aspirin 81 MG tablet Take 1 tablet (81 mg total) by mouth daily. 90 tablet 3  . atorvastatin (LIPITOR) 20 MG tablet TAKE 1 TABLET BY MOUTH EVERY DAY 90 tablet 1  . finasteride (PROSCAR) 5 MG tablet Take 5 mg by mouth daily.    Clinton Black (COZAAR) 50 MG tablet Take 1 tablet (50 mg total) by mouth daily. 90 tablet 3  . metoprolol succinate (TOPROL-XL) 25 MG 24 hr tablet TAKE 1 TABLET BY MOUTH EVERY DAY 30 tablet 6  . triazolam (HALCION) 0.25 MG tablet Take 1 tablet (0.25 mg total) by mouth at bedtime as needed. 30 tablet 3  . TURMERIC PO Take by mouth.    . spironolactone (ALDACTONE) 25 MG tablet Take 0.5 tablets (12.5 mg total) by mouth daily. 45 tablet 3   No current facility-administered medications for this visit.     Allergies:   Patient has no known allergies.  Social History:  The patient  reports that he quit smoking about 23 years ago. He has never used smokeless tobacco. He reports that he drinks about 8.4 oz of alcohol per week. He reports that he has current or past drug history.   Family History:  The patient's family history includes Aneurysm in his father; Arthritis in his mother; Heart disease in his father; Macular degeneration in his mother; Varicose Veins in his mother.    ROS:  Please see the history of present illness.   Otherwise, review of systems are positive for none.   All other systems are reviewed and negative.    PHYSICAL EXAM: VS:  BP 118/74   Pulse 65   Ht 6\' 4"  (1.93 m)   Wt 287 lb 3.2 oz (130.3 kg)   BMI 34.96 kg/m  , BMI Body mass index is 34.96 kg/m. GENERAL:  Well appearing, obese WM in NAD HEENT:  PERRL, EOMI, sclera are clear. Oropharynx is  clear. NECK:  No jugular venous distention, carotid upstroke brisk and symmetric, no bruits, no thyromegaly or adenopathy LUNGS:  Clear to auscultation bilaterally CHEST:  Unremarkable HEART:  RRR,  PMI not displaced or sustained,S1 and S2 within normal limits, no S3, no S4: no clicks, no rubs, no murmurs ABD:  Soft, nontender. BS +, no masses or bruits. No hepatomegaly, no splenomegaly EXT:  2 + pulses throughout, no edema, no cyanosis no clubbing SKIN:  Warm and dry.  No rashes NEURO:  Alert and oriented x 3. Cranial nerves II through XII intact. PSYCH:  Cognitively intact    EKG:  EKG is not ordered today.  Recent Labs: 12/24/2016: Hemoglobin 14.6; Platelets 171.0; Pro B Natriuretic peptide (BNP) 130.0; TSH 2.72 03/11/2017: ALT 31 07/06/2017: BUN 17; Creatinine, Ser 0.76; Potassium 4.4; Sodium 141    Lipid Panel    Component Value Date/Time   CHOL 105 03/11/2017 1541   TRIG 115 03/11/2017 1541   HDL 36 (L) 03/11/2017 1541   CHOLHDL 4 05/11/2016 1433   VLDL 46.8 (H) 05/11/2016 1433   LDLCALC 46 03/11/2017 1541   LDLDIRECT 120.0 05/11/2016 1433    dated 07/15/17: A1c 6.8%.  Wt Readings from Last 3 Encounters:  09/29/17 287 lb 3.2 oz (130.3 kg)  07/15/17 290 lb 12 oz (131.9 kg)  07/06/17 291 lb (132 kg)      Other studies Reviewed: Additional studies/ records that were reviewed today include:   Echo: 02/05/17:  Notes recorded by [name],  M, MD on 02/06/2017 at 8:17 AM EDT Echo shows normal LV function. He has moderate AI. Aortic root noted to be markedly dilated. Needs CT angiogram of aorta to evaluate for thoracic aneurysm. I would also start on Toprol XL 25 mg daily in addition to his other meds. Myoview study is pending.   [name] MD, [facility]      Study Result   Result status: Final result                           [facility]                        [address]                        [place], [place] [address]  [phone]  ------------------------------------------------------------------- Transthoracic Echocardiography  Patient:    Clinton, Black MR #:       [id number] Study Date: 02/05/2017 Gender:     M Age:        47 Height:     193 cm Weight:     131.6 kg BSA:        2.7 m^2 Pt. Status: Room:   ORDERING      [name], M.D.  REFERRING     [name], M.D.  ATTENDING    Jenkins Rouge, M.D.  REFERRING    Jenkins Rouge, M.D.  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  ------------------------------------------------------------------- History:   PMH:   Dyspnea.  Risk factors:  Former tobacco use. Hypertension. Diabetes mellitus. Morbidly obese. Dyslipidemia.  ------------------------------------------------------------------- Study Conclusions  - Left ventricle: The cavity size was mildly dilated. Wall   thickness was normal. Systolic function was normal. The estimated   ejection fraction was 55%. Wall motion was normal; there were no   regional wall motion abnormalities. The study is not technically   sufficient to allow evaluation of LV diastolic function. - Aortic valve: There was moderate regurgitation. - Aorta: Severe dilatation of ascending aortic root suggest CTA to   accurately measure. - Left atrium: The atrium was mildly dilated. - Atrial septum: No defect or patent foramen ovale was identified.  ------------------------------------------------------------------- Study data:  No prior study was available for comparison.  Study status:  Routine.  Procedure:  The patient reported no pain pre or post test. Transthoracic echocardiography. Image quality was adequate.  Study completion:  There were no complications. Transthoracic echocardiography.   M-mode, complete 2D, 3D, spectral Doppler, and color Doppler.  Birthdate:  Patient birthdate: Jan 06, 1946.  Age:  Patient is 72 yr old.  Sex:  Gender: male. BMI: 35.3 kg/m^2.  Blood pressure:     142/80  Patient status: Outpatient.  Study date:  Study date: 02/05/2017. Study time: 12:44 PM.  Location:  [facility]  -------------------------------------------------------------------  ------------------------------------------------------------------- Left ventricle:  The cavity size was mildly dilated. Wall thickness was normal. Systolic function was normal. The estimated ejection fraction was 55%. Wall motion was normal; there were no regional wall motion abnormalities. The study is not technically sufficient to allow evaluation of LV diastolic function.  ------------------------------------------------------------------- Aortic valve:   Trileaflet; normal thickness leaflets. Mobility was not restricted.  Doppler:  Transvalvular velocity was within the normal range. There was no stenosis. There was moderate regurgitation.  ------------------------------------------------------------------- Aorta:  Severe dilatation of ascending aortic root suggest CTA to accurately measure. Aortic root: The aortic root was normal in size.  ------------------------------------------------------------------- Mitral valve:   Structurally normal valve.   Mobility was not restricted.  Doppler:  Transvalvular velocity was within the normal range. There was no evidence for stenosis. There was no regurgitation.  ------------------------------------------------------------------- Left atrium:  The atrium was mildly dilated.  ------------------------------------------------------------------- Atrial septum:  No defect or patent foramen ovale was identified.   ------------------------------------------------------------------- Right ventricle:  The cavity size was normal. Wall thickness  was normal. Systolic function was normal.  ------------------------------------------------------------------- Pulmonic valve:    Doppler:  Transvalvular velocity was within the normal range. There was no evidence for stenosis. There was trivial regurgitation.  ------------------------------------------------------------------- Tricuspid valve:   Structurally normal valve.    Doppler: Transvalvular velocity was within the normal range. There was mild regurgitation.  ------------------------------------------------------------------- Pulmonary artery:  The main pulmonary artery was normal-sized. Systolic pressure was within the normal range.  ------------------------------------------------------------------- Right atrium:  The atrium was normal in size.  ------------------------------------------------------------------- Pericardium:  The pericardium was normal in appearance. There was no pericardial effusion.  ------------------------------------------------------------------- Systemic veins: Inferior vena cava: The vessel was normal in size. The respirophasic diameter changes were in the normal range (= 50%), consistent with normal central venous pressure.  ------------------------------------------------------------------- Post procedure conclusions Ascending Aorta:  - Severe dilatation of ascending aortic root suggest CTA to   accurately measure.    Myoview 02/12/17: Study Highlights    The left ventricular ejection fraction is moderately decreased (30-44%).  Nuclear stress EF: 39%.  There was no ST segment deviation noted during stress.  No T wave inversion was noted during stress.  This is an intermediate risk study due to reduced systolic function.  There is no ischemia.   CT ANGIOGRAPHY CHEST WITH CONTRAST  TECHNIQUE: Multidetector CT imaging of the chest was performed using the standard protocol during bolus administration of  intravenous contrast. Multiplanar CT image reconstructions and MIPs were obtained to evaluate the vascular anatomy.  CONTRAST:  100 mL of Isovue 370 intravenously.  COMPARISON:  None.  FINDINGS: Cardiovascular: Aortic root measures 3.9 cm in diameter. 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 Aneurysmal dilatation of descending thoracic aorta is noted with maximum measured diameter 4.1 cm. Aortic atherosclerosis is noted without dissection. Great vessels are widely patent without stenosis. No pericardial effusion is noted.  Mediastinum/Nodes: No enlarged mediastinal, hilar, or axillary lymph nodes. Thyroid gland, trachea, and esophagus demonstrate no significant findings.  Lungs/Pleura: Lungs are clear. No pleural effusion or pneumothorax.  Upper Abdomen: Probable fatty infiltration of the liver is noted.  Musculoskeletal: 8.6 x 5.3 cm fat containing lesion is seen within the right pectoralis muscle most consistent with intramuscular lipoma.  Review of the MIP images confirms the above findings.  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 Recommend semi-annual imaging followup by CTA or MRA and referral to cardiothoracic surgery if not already obtained. This recommendation follows 2010 ACCF/AHA/AATS/ACR/ASA/SCA/SCAI/SIR/STS/SVM Guidelines for the Diagnosis and Management of Patients With Thoracic Aortic Disease. Circulation. 2010; 121: I458-K998.  Probable fatty infiltration of the liver.  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  Aortic Atherosclerosis (ICD10-I70.0).   Electronically Signed   By: Marijo Conception, M.D.   On: 02/26/2017 11:57  Echo 06/03/17: Study Conclusions  - Left ventricle: Distal septal hypokinesis. The cavity size was   mildly dilated. Wall thickness was increased in a pattern of mild   LVH. Systolic function was normal.  The estimated ejection   fraction was in the range of 50% to 55%. Left ventricular   diastolic function parameters were normal. - Aortic valve: No well seen likely tri leaflet. There was mild   regurgitation. - Aorta: Severe aortic root dilatation suggest f/u CTA / MRA to   evaluate - Left atrium: The atrium was mildly dilated. - Atrial septum: No defect or patent foramen ovale was identified.  ASSESSMENT AND PLAN: 1. Thoracic aortic aneurysm. 4.6 cm. Now followed by Dr. Servando Snare with CT surgery. Recommend aggressive BP control. Continue Toprol XL 25 mg daily and will start Black 50 mg daily.   Recommend avoiding heavy lifting or staining. Follow up CT planned by Dr. Servando Snare.  2. 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 Need to monitor at least yearly. Echo in December was stable.   3.  Chronic primarily diastolic CHF. May have component of sleep apnea. Patient has  deferred sleep study evaluation. He does have Mild LV impairment. On ARB and Beta blocker. On Aldactone. Appears euvolemic today 4. Frequent PVCs. See above 5. HTN.  6. DM type 2 7. HLD- on lipitor. Excellent control   Current medicines are reviewed at length with the patient today.  The patient does not have concerns regarding medicines.  The following changes have been made:  See above.  Labs/ tests ordered today include:   No orders of the defined types were placed in this encounter.    Disposition:   FU with me 6 months.  Signed,  [name], MD ,[facility] 09/29/2017 11:39 AM    [facility] [address] [place], [place], [address] Phone [phone], Fax [phone]

## 2017-09-29 ENCOUNTER — Ambulatory Visit: Payer: Medicare HMO | Admitting: Cardiology

## 2017-09-29 ENCOUNTER — Encounter: Payer: Self-pay | Admitting: Cardiology

## 2017-09-29 VITALS — BP 118/74 | HR 65 | Ht 76.0 in | Wt 287.2 lb

## 2017-09-29 DIAGNOSIS — I351 Nonrheumatic aortic (valve) insufficiency: Secondary | ICD-10-CM | POA: Diagnosis not present

## 2017-09-29 DIAGNOSIS — I712 Thoracic aortic aneurysm, without rupture, unspecified: Secondary | ICD-10-CM

## 2017-09-29 DIAGNOSIS — I10 Essential (primary) hypertension: Secondary | ICD-10-CM | POA: Diagnosis not present

## 2017-09-29 DIAGNOSIS — I5032 Chronic diastolic (congestive) heart failure: Secondary | ICD-10-CM | POA: Diagnosis not present

## 2017-09-29 NOTE — Patient Instructions (Signed)
Continue your current therapy  I will see you in 6 months.   

## 2017-11-01 DIAGNOSIS — R972 Elevated prostate specific antigen [PSA]: Secondary | ICD-10-CM | POA: Diagnosis not present

## 2017-11-01 LAB — PSA: PSA: 0.94

## 2017-11-04 ENCOUNTER — Ambulatory Visit: Payer: Medicare HMO | Admitting: Cardiothoracic Surgery

## 2017-11-04 ENCOUNTER — Encounter: Payer: Self-pay | Admitting: Cardiothoracic Surgery

## 2017-11-04 ENCOUNTER — Other Ambulatory Visit: Payer: Self-pay

## 2017-11-04 ENCOUNTER — Ambulatory Visit
Admission: RE | Admit: 2017-11-04 | Discharge: 2017-11-04 | Disposition: A | Discharge status: Home / Self Care | Payer: Medicare HMO | Source: Ambulatory Visit | Attending: Cardiothoracic Surgery | Admitting: Cardiothoracic Surgery

## 2017-11-04 VITALS — BP 131/69 | HR 57 | Resp 18 | Ht 76.0 in | Wt 285.0 lb

## 2017-11-04 DIAGNOSIS — I517 Cardiomegaly: Secondary | ICD-10-CM

## 2017-11-04 DIAGNOSIS — I351 Nonrheumatic aortic (valve) insufficiency: Secondary | ICD-10-CM

## 2017-11-04 DIAGNOSIS — I712 Thoracic aortic aneurysm, without rupture, unspecified: Secondary | ICD-10-CM

## 2017-11-04 IMAGING — CT CT ANGIO CHEST
2 of 6 series · 13 of 36 positions shown · IV contrast (iopamidol)
Comparison: [DATE]

CLINICAL DATA: Thoracic aortic aneurysm

EXAM:
CT ANGIOGRAPHY CHEST WITH CONTRAST
TECHNIQUE: Multidetector CT imaging of the chest was performed using the
standard protocol during bolus administration of intravenous
contrast. Multiplanar CT image reconstructions and MIPs were
obtained to evaluate the vascular anatomy.
CONTRAST:  75mL [J5] IOPAMIDOL ([J5]) INJECTION 76%

[Series 4: cta thorax 2.00 bv36 s3 ax axial arterial · axial · arterial · 0.71mm/px · z∈[+1667,+1953]mm · 12 of 170 slices shown]
[im 14/170  lung]
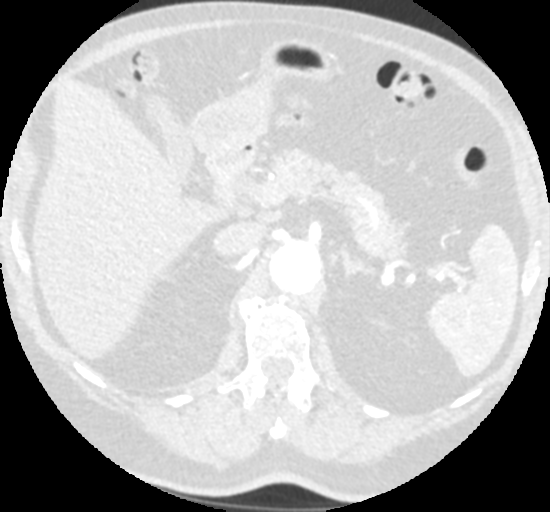
[im 27/170  mediastinal]
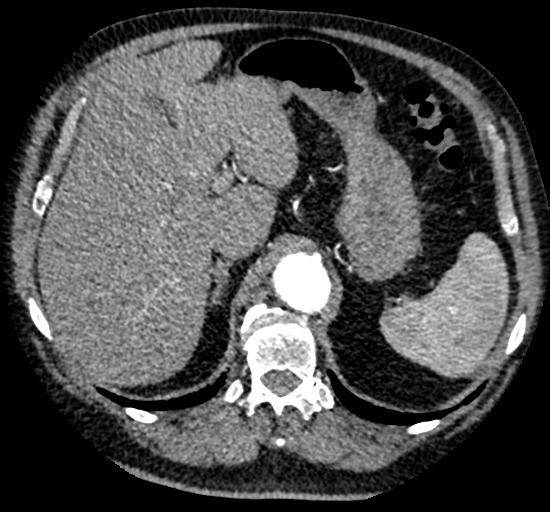
[im 40/170  lung]
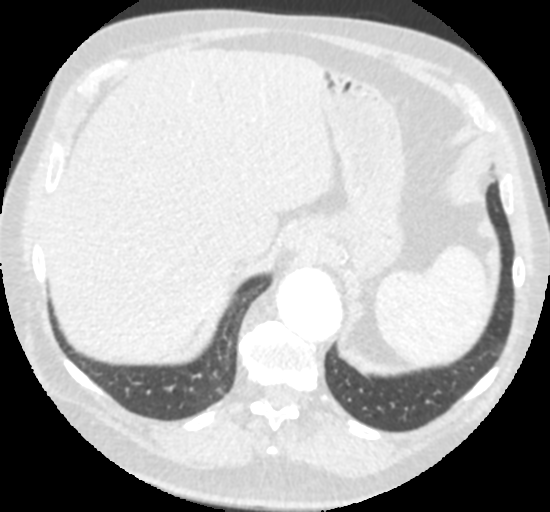
[im 53/170  mediastinal]
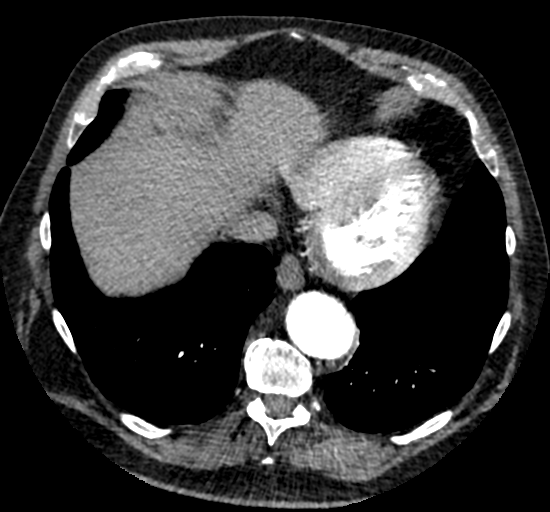
[im 66/170  lung]
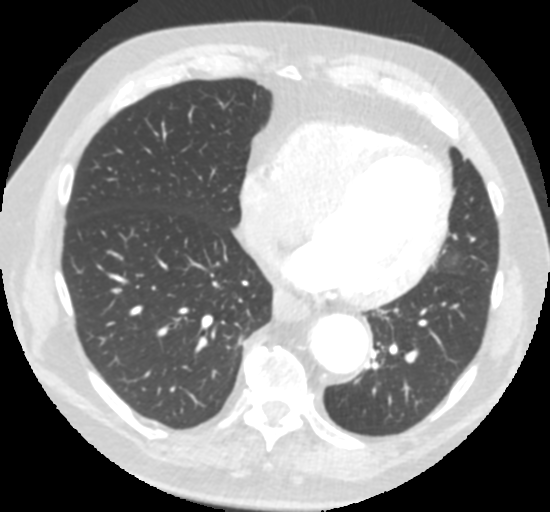
[im 79/170  mediastinal]
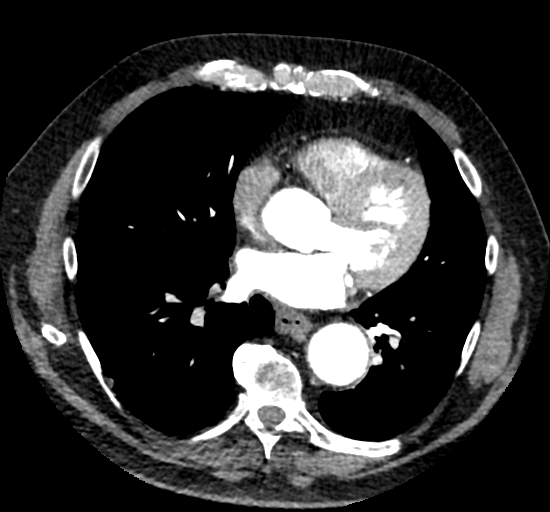
[im 92/170  lung]
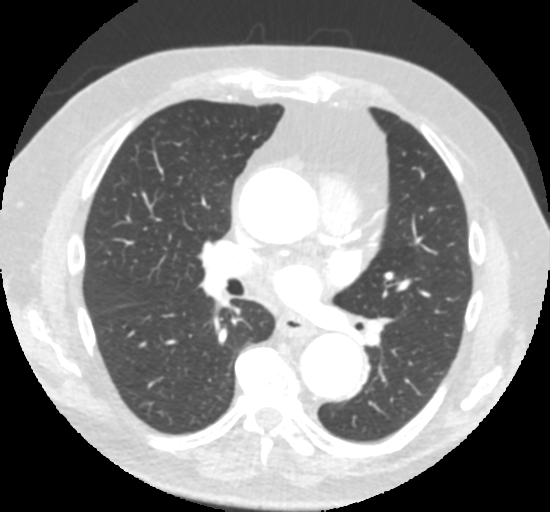
[im 105/170  mediastinal]
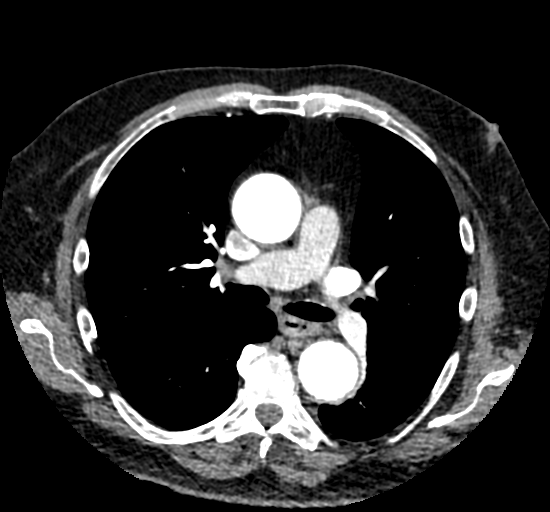
[im 118/170  lung]
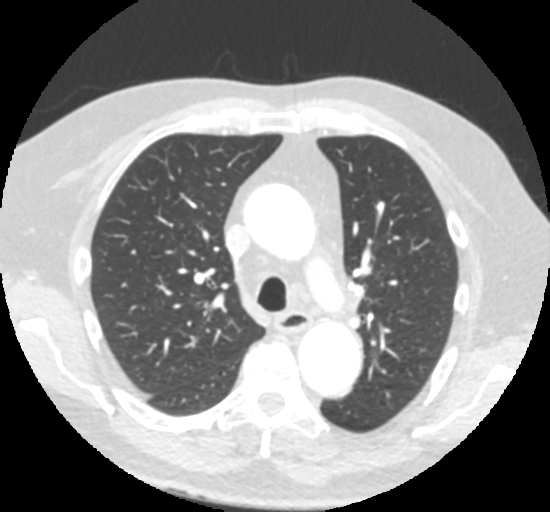
[im 131/170  mediastinal]
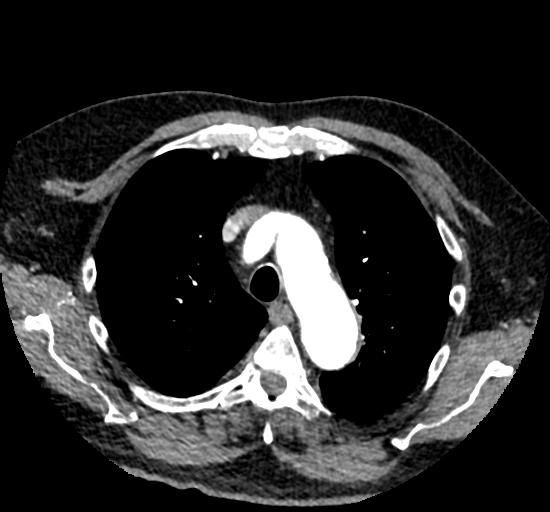
[im 144/170  lung]
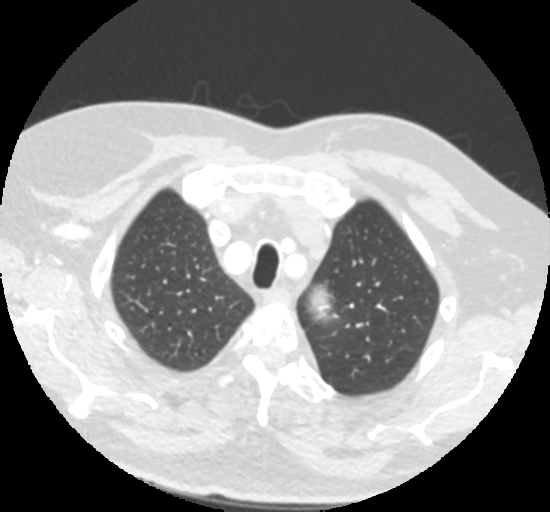
[im 157/170  mediastinal]
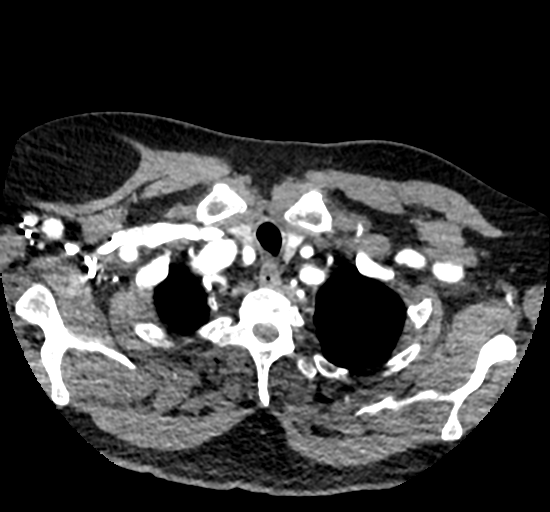

[Series 9: cta thorax 2.00 bv36 s3 cor cor st · coronal · 0.67mm/px · 1 of 179 slices shown]
[im 90/179  mediastinal]
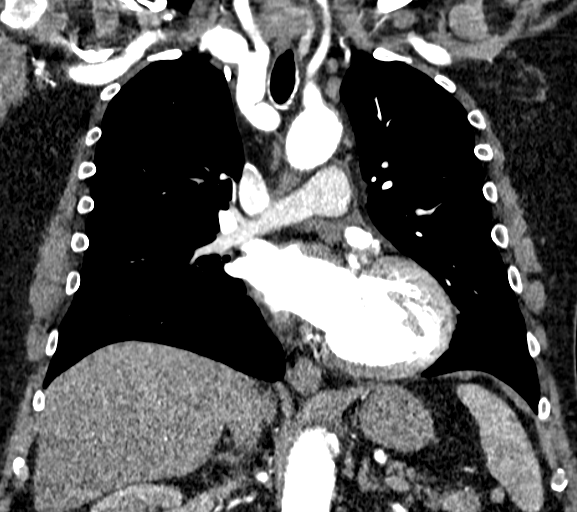

[13 of 36 positions shown; findings below may reference images not displayed]

FINDINGS: Cardiovascular: Maximal diameters of the ascending aorta at the
sinus of Valsalva, MOMOT junction, and ascending aorta are
3.9 cm, 3.8 cm, and 5.2 cm respectively. Based on my direct
measurements on the prior study, this is not significantly changed.
There is no evidence of aortic dissection. There is no obvious
intramural hematoma. Maximal diameter of the proximal descending
thoracic aorta is 4.3 cm. The aortic arch is nonaneurysmal.
Atherosclerotic calcifications in the aortic arch. Minimal
atherosclerotic calcifications in the descending thoracic aorta.

Great vessels are patent. Right subclavian artery is ectatic with a
maximal diameter of 2.0 cm.

Mild LAD territory coronary artery calcifications.

No obvious acute pulmonary thromboembolism.

Mediastinum/Nodes: No abnormal adenopathy. No pericardial effusion.
Visualized thyroid is atrophic. Mild diffuse wall thickening of the
esophagus.

Lungs/Pleura: No pneumothorax. No pleural effusion. Pleural base
nodule in the lateral right lower lobe on image 92 of series 6
measures 7 mm. This is stable.

Upper Abdomen: No acute abnormality.

Musculoskeletal: No vertebral compression deformity.

Review of the MIP images confirms the above findings.
IMPRESSION: Aneurysmal dilatation of the ascending aorta is 5.2 cm. Based on my
direct measurements of the prior study, this has not significantly
changed. There is no evidence of dissection. Mild aneurysmal
dilatation of the descending thoracic aorta at 4.3 cm is also not
significantly changed. Ascending thoracic aortic aneurysm. Recommend
semi-annual imaging followup by CTA or MRA and referral to
cardiothoracic surgery if not already obtained. This recommendation
follows [J5] ACCF/AHA/AATS/ACR/ASA/SCA/MOMOT/MOMOT/MOMOT/MOMOT Guidelines
for the Diagnosis and Management of Patients With Thoracic Aortic
Disease. Circulation. [J5]; 121: [id number]

Aortic Atherosclerosis ([J5]-[J5]).

## 2017-11-04 MED ORDER — IOPAMIDOL (ISOVUE-370) INJECTION 76%
75.0000 mL | Freq: Once | INTRAVENOUS | Status: AC | PRN
  Administered 2017-11-04: 75 mL via INTRAVENOUS

## 2017-11-04 NOTE — Patient Instructions (Signed)

## 2017-11-04 NOTE — Progress Notes (Signed)
[facility] [address]       Clinton Black,Clinton Black [id number]             [phone]                    Clinton Black  Medical Record #[id number] Date of Birth: 20-Jul-1945  Referring: [name], [name], MD Primary Care: [name], MD  Chief Complaint:    Chief Complaint  Patient presents with  . Thoracic Aortic Aneurysm    6 month f/u with CTA chest    History of Present Illness:    Clinton Black 72 y.o. male is seen in the office 6 months agofor evaluation of ascending aortic dilatation maximum 4.6 cm.  The patient originally presented to y his primary care doctor complaining of urinary frequency at night nocturia and shortness of breath.  He is referred to cardiology echocardiogram nuclear stress test and CT a of the chest were done.   Since last seen the patient notes that he is retired from bartending, spends much less time standing on his feet.  Overall he notes that the swelling in his lower extremities is much improved.  His shortness of breath with exertion has improved.  He denies any anginal symptoms  Patient has no family history of aortic dissection he does note that his father died at age 35 suddenly with a cerebral embolus mother died at age 29 he is had one sister died at age 71 of breast cancer and another sister has had breast cancer in atrial fib still alive patient has no children no family history of aortic dissections or aneurysm.  Current Activity/ Functional Status:  Patient is independent with mobility/ambulation, transfers, ADL's, IADL's.   Zubrod Score: At the time of surgery this patient's most appropriate activity status/level should be described as: []     0    Normal activity, no symptoms [x]     1    Restricted in physical strenuous activity but ambulatory, able to do out light work []     2    Ambulatory and capable of self care, unable to do work activities, up and about               >50 % of waking hours                               []     3    Only limited self care, in bed greater than 50% of waking hours []     4    Completely disabled, no self care, confined to bed or chair []     5    Moribund   Past Medical History:  Diagnosis Date  . Arthritis   . BPH (benign prostatic hyperplasia)   . Diabetes mellitus type 2 in obese ([place])   . HTN (hypertension)   . Hyperlipidemia, mixed   . Insomnia   . Obesity     Past Surgical History:  Procedure Laterality Date  . COLONOSCOPY    . CYST EXCISION     polynomial  . KNEE SURGERY  2000  . TONSILLECTOMY    . widson teeth extraction      Family History  Problem Relation Age of Onset  . Aneurysm Father   . Heart disease Father   . Varicose Veins Mother   . Arthritis Mother   . Macular degeneration Mother   .  Cancer Neg Hx   . Colon cancer Neg Hx   . Esophageal cancer Neg Hx   . Rectal cancer Neg Hx   . Stomach cancer Neg Hx     Social History   Socioeconomic History  . Marital status: Single    Spouse name: Not on file  . Number of children: Not on file  . Years of education: Not on file  . Highest education level: Not on file  Occupational History  . Not on file  Social Needs  . Financial resource strain: Not on file  . Food insecurity:    Worry: Not on file    Inability: Not on file  . Transportation needs:    Medical: Not on file    Non-medical: Not on file  Tobacco Use  . Smoking status: Former Smoker    Last attempt to quit: 06/15/1994    Years since quitting: 23.4  . Smokeless tobacco: Never Used  Substance and Sexual Activity  . Alcohol use: Yes    Alcohol/week: 8.4 oz    Types: 14 Shots of liquor per week    Comment: 2 drinks of liquor per day  . Drug use: Yes    Comment: occasionally  . Sexual activity: Yes    Partners: Female    Birth control/protection: Condom    Comment: condom use most of the time but not always  Lifestyle  . Physical activity:    Days per week: Not on file    Minutes per session: Not on file  .  Stress: Not on file  Relationships  . Social connections:    Talks on phone: Not on file    Gets together: Not on file    Attends religious service: Not on file    Active member of club or organization: Not on file    Attends meetings of clubs or organizations: Not on file    Relationship status: Not on file  . Intimate partner violence:    Fear of current or ex partner: Not on file    Emotionally abused: Not on file    Physically abused: Not on file    Forced sexual activity: Not on file  Other Topics Concern  . Not on file  [place] [employer]. married '72- 3 years/divorced; married '89- 3 years/divorced;. married '03 - 3 years/divorced. 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    Social History   Tobacco Use  Smoking Status Former Smoker  . Last attempt to quit: 06/15/1994  . Years since quitting: 23.4  Smokeless Tobacco Never Used    Social History   Substance and Sexual Activity  Alcohol Use Yes  . Alcohol/week: 8.4 oz  . Types: 14 Shots of liquor per week   Comment: 2 drinks of liquor per day     No Known Allergies  Current Outpatient Medications  Medication Sig Dispense Refill  . aspirin 81 MG tablet Take 1 tablet (81 mg total) by mouth daily. 90 tablet 3  . atorvastatin (LIPITOR) 20 MG tablet TAKE 1 TABLET BY MOUTH EVERY DAY 90 tablet 1  . finasteride (PROSCAR) 5 MG tablet Take 5 mg by mouth daily.    Marland Kitchen losartan (COZAAR) 50 MG tablet Take 1 tablet (50 mg total) by mouth daily. 90 tablet 3  . metoprolol succinate (TOPROL-XL) 25 MG 24 hr tablet TAKE 1 TABLET BY MOUTH EVERY DAY 30 tablet 6  . triazolam (HALCION) 0.25 MG tablet Take 1 tablet (  0.25 mg total) by mouth at bedtime as needed. 30 tablet 3  . TURMERIC PO Take by mouth.    . Coenzyme Q10 100 MG capsule     . spironolactone (ALDACTONE) 25 MG tablet Take 0.5 tablets (12.5 mg total) by mouth daily. 45 tablet 3   No current facility-administered medications for this visit.      Pertinent items are noted in HPI.   Review of Systems:     Review of Systems  Constitutional: Negative.   HENT: Negative.   Eyes: Negative.   Respiratory: Positive for shortness of breath. Negative for cough, hemoptysis, sputum production and wheezing.   Cardiovascular: Positive for leg swelling. Negative for chest pain, palpitations, orthopnea, claudication and PND.  Gastrointestinal: Negative for abdominal pain, blood in stool, constipation, diarrhea, heartburn, melena, nausea and vomiting.  Genitourinary: Negative.   Musculoskeletal: Positive for back pain and joint pain. Negative for falls, myalgias and neck pain.  Skin: Negative.   Neurological: Negative.   Endo/Heme/Allergies: Negative.   Psychiatric/Behavioral: Negative.    Physical Exam: BP 131/69 (BP Location: Left Arm, Patient Position: Sitting, Cuff Size: Large)   Pulse (!) 57   Resp 18   Ht 6\' 4"  (1.93 m)   Wt 285 lb (129.3 kg)   SpO2 99% Comment: RA  BMI 34.69 kg/m   PHYSICAL EXAMINATION: General appearance: alert, cooperative and appears older than stated age Head: Normocephalic, without obvious abnormality, atraumatic Neck: no adenopathy, no carotid bruit, no JVD, supple, symmetrical, trachea midline and thyroid not enlarged, symmetric, no tenderness/mass/nodules Lymph nodes: Cervical, supraclavicular, and axillary nodes normal. and The large lipoma in the right supraclavicular area is not evident on physical exam Resp: clear to auscultation bilaterally Back: symmetric, no curvature. ROM normal. No CVA tenderness. Cardio: diastolic murmur: mid diastolic 2/6, decrescendo at lower left sternal border GI: soft, non-tender; bowel sounds normal; no masses,  no organomegaly Extremities: extremities normal, atraumatic, no cyanosis or edema and Homans sign is negative, no sign of DVT Neurologic: Grossly normal  Patient has palpable DP and PT pulses      Recent Radiology Findings:  Ct Angio Chest Aorta W/cm &/or  Wo/cm  Result Date: 11/04/2017 CLINICAL DATA:  Thoracic aortic aneurysm EXAM: CT ANGIOGRAPHY CHEST WITH CONTRAST TECHNIQUE: Multidetector CT imaging of the chest was performed using the standard protocol during bolus administration of intravenous contrast. Multiplanar CT image reconstructions and MIPs were obtained to evaluate the vascular anatomy. CONTRAST:  57mL ISOVUE-370 IOPAMIDOL (ISOVUE-370) INJECTION 76% COMPARISON:  02/26/2017 FINDINGS: Cardiovascular: Maximal diameters of the ascending aorta at the sinus of Valsalva, sino-tubular junction, and ascending aorta are 3.9 cm, 3.8 cm, and 5.2 cm respectively. Based on my direct measurements on the prior study, this is not significantly changed. There is no evidence of aortic dissection. There is no obvious intramural hematoma. Maximal diameter of the proximal descending thoracic aorta is 4.3 cm. The aortic arch is nonaneurysmal. Atherosclerotic calcifications in the aortic arch. Minimal atherosclerotic calcifications in the descending thoracic aorta. Great vessels are patent. Right subclavian artery is ectatic with a maximal diameter of 2.0 cm. Mild LAD territory coronary artery calcifications. No obvious acute pulmonary thromboembolism. Mediastinum/Nodes: No abnormal adenopathy. No pericardial effusion. Visualized thyroid is atrophic. Mild diffuse wall thickening of the esophagus. Lungs/Pleura: No pneumothorax. No pleural effusion. Pleural base nodule in the lateral right lower lobe on image 92 of series 6 measures 7 mm. This is stable. Upper Abdomen: No acute abnormality. Musculoskeletal: No vertebral compression deformity. Review of the MIP images  confirms the above findings. IMPRESSION: Aneurysmal dilatation of the ascending aorta is 5.2 cm. Based on my direct measurements of the prior study, this has not significantly changed. There is no evidence of dissection. Mild aneurysmal dilatation of the descending thoracic aorta at 4.3 cm is also not significantly  changed. Ascending thoracic aortic aneurysm. Recommend semi-annual imaging followup by CTA or MRA and referral to cardiothoracic surgery if not already obtained. This recommendation follows 2010 ACCF/AHA/AATS/ACR/ASA/SCA/SCAI/SIR/STS/SVM Guidelines for the Diagnosis and Management of Patients With Thoracic Aortic Disease. Circulation. 2010; 121: I433-I951 Aortic Atherosclerosis (ICD10-I70.0). Electronically Signed   By: Marybelle Killings M.D.   On: 11/04/2017 09:05  I have independently reviewed the above radiology studies  and reviewed the findings with the patient. I have personally measured the older CT and the current CT. I agree with the radiologist that there is does not appear to be any significant change, however the maximum diameter get is 4.7 centimeters in a sending aorta 3.8 to 3.9 cm at the sinus of Valsalva    CLINICAL DATA:  Aortic root dilatation.  EXAM: CT ANGIOGRAPHY CHEST WITH CONTRAST  TECHNIQUE: Multidetector CT imaging of the chest was performed using the standard protocol during bolus administration of intravenous contrast. Multiplanar CT image reconstructions and MIPs were obtained to evaluate the vascular anatomy.  CONTRAST:  100 mL of Isovue 370 intravenously.  COMPARISON:  None.  FINDINGS: Cardiovascular: Aortic root measures 3.9 cm in diameter. 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 Aneurysmal dilatation of descending thoracic aorta is noted with maximum measured diameter 4.1 cm. Aortic atherosclerosis is noted without dissection. Great vessels are widely patent without stenosis. No pericardial effusion is noted.  Mediastinum/Nodes: No enlarged mediastinal, hilar, or axillary lymph nodes. Thyroid gland, trachea, and esophagus demonstrate no significant findings.  Lungs/Pleura: Lungs are clear. No pleural effusion or pneumothorax.  Upper Abdomen: Probable fatty infiltration of the liver is  noted.  Musculoskeletal: 8.6 x 5.3 cm fat containing lesion is seen within the right pectoralis muscle most consistent with intramuscular lipoma.  Review of the MIP images confirms the above findings.  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 Recommend semi-annual imaging followup by CTA or MRA and referral to cardiothoracic surgery if not already obtained. This recommendation follows 2010 ACCF/AHA/AATS/ACR/ASA/SCA/SCAI/SIR/STS/SVM Guidelines for the Diagnosis and Management of Patients With Thoracic Aortic Disease. Circulation. 2010; 121: O841-Y606.  Probable fatty infiltration of the liver.  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  Aortic Atherosclerosis (ICD10-I70.0).   Electronically Signed   By: Marijo Conception, M.D.   On: 02/26/2017 11:57  I have independently reviewed the above radiologic studies.  Recent Lab Findings: Lab Results  Component Value Date   WBC 7.6 12/24/2016   HGB 14.6 12/24/2016   HCT 43.4 12/24/2016   PLT 171.0 12/24/2016   GLUCOSE 126 (H) 07/06/2017   CHOL 105 03/11/2017   TRIG 115 03/11/2017   HDL 36 (L) 03/11/2017   LDLDIRECT 120.0 05/11/2016   LDLCALC 46 03/11/2017   ALT 31 03/11/2017   AST 19 03/11/2017   NA 141 07/06/2017   K 4.4 07/06/2017   CL 105 07/06/2017   CREATININE 0.76 07/06/2017   BUN 17 07/06/2017   CO2 21 07/06/2017   TSH 2.72 12/24/2016   HGBA1C 6.8 07/15/2017   ECHO: [facility]  [address] [place], [place] [address]                            [phone]  ------------------------------------------------------------------- Transthoracic Echocardiography  Patient:    Clinton Black, Clinton Black MR #:       [id number] Study Date: 02/05/2017 Gender:     M Age:        75 Height:     193 cm Weight:     131.6 kg BSA:        2.7 m^2 Pt. Status: Room:   ORDERING     [name], M.D.  REFERRING     [name], M.D.  ATTENDING    Jenkins Rouge, M.D.  REFERRING    Jenkins Rouge, M.D.  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  ------------------------------------------------------------------- History:   PMH:   Dyspnea.  Risk factors:  Former tobacco use. Hypertension. Diabetes mellitus. Morbidly obese. Dyslipidemia.  ------------------------------------------------------------------- Study Conclusions  - Left ventricle: The cavity size was mildly dilated. Wall   thickness was normal. Systolic function was normal. The estimated   ejection fraction was 55%. Wall motion was normal; there were no   regional wall motion abnormalities. The study is not technically   sufficient to allow evaluation of LV diastolic function. - Aortic valve: There was moderate regurgitation. - Aorta: Severe dilatation of ascending aortic root suggest CTA to   accurately measure. - Left atrium: The atrium was mildly dilated. - Atrial septum: No defect or patent foramen ovale was identified.  ------------------------------------------------------------------- Study data:  No prior study was available for comparison.  Study status:  Routine.  Procedure:  The patient reported no pain pre or post test. Transthoracic echocardiography. Image quality was adequate.  Study completion:  There were no complications. Transthoracic echocardiography.  M-mode, complete 2D, 3D, spectral Doppler, and color Doppler.  Birthdate:  Patient birthdate: Jun 29, 1945.  Age:  Patient is 72 yr old.  Sex:  Gender: male. BMI: 35.3 kg/m^2.  Blood pressure:     142/80  Patient status: Outpatient.  Study date:  Study date: 02/05/2017. Study time: 12:44 PM.  Location:  [facility]  -------------------------------------------------------------------  ------------------------------------------------------------------- Left ventricle:  The cavity size was mildly dilated. Wall thickness was normal. Systolic function was normal. The estimated ejection fraction was 55%. Wall motion was normal; there were no regional wall motion abnormalities. The study is not technically sufficient to allow evaluation of LV diastolic function.  ------------------------------------------------------------------- Aortic valve:   Trileaflet; normal thickness leaflets. Mobility was not restricted.  Doppler:  Transvalvular velocity was within the normal range. There was no stenosis. There was moderate regurgitation.  ------------------------------------------------------------------- Aorta:  Severe dilatation of ascending aortic root suggest CTA to accurately measure. Aortic root: The aortic root was normal in size.  ------------------------------------------------------------------- Mitral valve:   Structurally normal valve.   Mobility was not restricted.  Doppler:  Transvalvular velocity was within the normal range. There was no evidence for stenosis. There was no regurgitation.  ------------------------------------------------------------------- Left atrium:  The atrium was mildly dilated.  ------------------------------------------------------------------- Atrial septum:  No defect or patent foramen ovale was identified.   ------------------------------------------------------------------- Right ventricle:  The cavity size was normal. Wall thickness was normal. Systolic  function was normal.  ------------------------------------------------------------------- Pulmonic valve:    Doppler:  Transvalvular velocity was within the normal range. There was no evidence for stenosis. There was  trivial regurgitation.  ------------------------------------------------------------------- Tricuspid valve:   Structurally normal valve.    Doppler: Transvalvular velocity was within the normal range. There was mild regurgitation.  ------------------------------------------------------------------- Pulmonary artery:   The main pulmonary artery was normal-sized. Systolic pressure was within the normal range.  ------------------------------------------------------------------- Right atrium:  The atrium was normal in size.  ------------------------------------------------------------------- Pericardium:  The pericardium was normal in appearance. There was no pericardial effusion.  ------------------------------------------------------------------- Systemic veins: Inferior vena cava: The vessel was normal in size. The respirophasic diameter changes were in the normal range (= 50%), consistent with normal central venous pressure.  ------------------------------------------------------------------- Post procedure conclusions Ascending Aorta:  - Severe dilatation of ascending aortic root suggest CTA to   accurately measure.  ------------------------------------------------------------------- Measurements   Left ventricle                           Value        Reference  LV ID, ED, PLAX chordal          (H)     63.7  mm     43 - 52  LV ID, ES, PLAX chordal          (H)     56.5  mm     23 - 38  LV fx shortening, PLAX chordal   (L)     11    %      >=29  LV PW thickness, ED                      9.52  mm     ---------  IVS/LV PW ratio, ED                      0.97         <=1.3  LV e&', lateral                           7.83  cm/s   ---------  LV E/e&', lateral                         6.19         ---------  LV e&', medial                            3.85  cm/s   ---------  LV E/e&', medial                          12.6         ---------  LV e&', average                            5.84  cm/s   ---------  LV E/e&', average                         8.3          ---------    Ventricular septum  Value        Reference  IVS thickness, ED                        9.19  mm     ---------    LVOT                                     Value        Reference  LVOT peak velocity, S                    134   cm/s   ---------  LVOT mean velocity, S                    98.4  cm/s   ---------  LVOT VTI, S                              27.2  cm     ---------  LVOT peak gradient, S                    7     mm Hg  ---------    Aortic valve                             Value        Reference  Aortic regurg pressure half-time         376   ms     ---------    Aorta                                    Value        Reference  Aortic root ID, ED                       41    mm     ---------  Ascending aorta ID, A-P, S               44    mm     ---------    Left atrium                              Value        Reference  LA ID, A-P, ES                           40    mm     ---------  LA ID/bsa, A-P                           1.48  cm/m^2 <=2.2  LA volume, S                             48.6  ml     ---------  LA volume/bsa, S                         18    ml/m^2 ---------  LA volume, ES, 1-p A4C                   40    ml     ---------  LA volume/bsa, ES, 1-p A4C               14.8  ml/m^2 ---------  LA volume, ES, 1-p A2C                   51.4  ml     ---------  LA volume/bsa, ES, 1-p A2C               19.1  ml/m^2 ---------    Mitral valve                             Value        Reference  Mitral E-wave peak velocity              48.5  cm/s   ---------  Mitral A-wave peak velocity              82.9  cm/s   ---------  Mitral deceleration time                 204   ms     150 - 230  Mitral E/A ratio, peak                   0.6          ---------    Systemic veins                           Value        Reference  Estimated CVP                            3     mm  Hg  ---------    Right ventricle                          Value        Reference  TAPSE                                    16.9  mm     ---------  RV s&', lateral, S                        11.8  cm/s   ---------  Legend: (L)  and  (H)  mark values outside specified reference range.  ------------------------------------------------------------------- Prepared and Electronically Authenticated by  Jenkins Rouge, M.D. 2018-08-24T13:47:32 Study Highlights    The left ventricular ejection fraction is moderately decreased (30-44%).  Nuclear stress EF: 39%.  There was no ST segment deviation noted during stress.  No T wave inversion was noted during stress.  This is an intermediate risk study due to reduced systolic function.  There is no ischemia.    Nuclear History and Indications   History and Indications Indication for Stress Test: Diagnosis of coronary disease History: Hx Acute Bronchitis; No prior cardiac history reported; No prior NUC MPI for comparison. Cardiac Risk Factors: Family History - CAD, History of Smoking, Hypertension, Lipids and Obesity  Symptoms: Dizziness, DOE,  Fatigue and SOB  Stress Findings   ECG Baseline ECG exhibits normal sinus rhythm..  Stress Findings A pharmacological stress test was performed using IV Lexiscan 0.4mg  over 10 seconds performed without concurrent submaximal exercise.  The patient reported shortness of breath during the stress test.   Test was stopped per protocol.  Response to Stress There was no ST segment deviation noted during stress.  No T wave inversion was noted during stress. Arrhythmias during stress: frequent PVCs.  Arrhythmias during recovery: none.  Arrhythmias were not significant.  ECG was interpretable and there was no significant change from baseline.  Stress Measurements   Baseline Vitals  Rest HR 63 bpm    Rest BP 141/67 mmHg    Peak Stress Vitals  Peak HR 74 bpm    Peak BP 136/69 mmHg       Nuclear  Stress Measurements   LV sys vol 143 mL    TID 0.98     LV dias vol 234 mL    SSS 0     SRS 0     SDS 0          Nuclear Stress Findings   Isotope administration Rest isotope was administered with an IV injection of 31.3 mCi technetium tetrofosmin. Rest SPECT images were obtained approximately 45 minutes post tracer injection. Stress isotope was administered with an IV injection of 29.6 mCi technetium tetrofosmin 20 seconds post IV Lexiscan administration. Stress SPECT images were obtained approximately 60 minutes post tracer injection.  Nuclear Study Quality Overall image quality is good.  Nuclear Measurements Study was gated.  Rest Perfusion Rest perfusion normal.  Stress Perfusion Stress perfusion normal.  Overall Study Impression Myocardial perfusion is normal. This is an intermediate risk study. Overall left ventricular systolic function was abnormal. LV cavity size is moderately enlarged. Nuclear stress EF: 39%. The left ventricular ejection fraction is moderately decreased (30-44%). There is no prior study for comparison.  From: ACCF/SCAI/STS/AATS/AHA/ASNC/HFSA/SCCT 2012 Appropriate Use Criteria for Coronary Revascularization Focused Update  Wall Scoring   Score Index: 2.000 Percent Normal: 0.0%          The left ventricular wall motion is globally hypokinetic.          Signed   Electronically signed by Skeet Latch, MD on 02/16/17 at [place] EDT  Report approved and finalized on 02/16/2017 1918   Aortic Size Index=  4.7       /Body surface area is 2.63 meters squared. 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 He was again cautioned about strenuous lifting, Valsalva maneuvers He was cautioned about use of Cipro Encouraged to have regular dental checkup   I  spent 15 minutes with  the  patient face to face and greater then 50% of the time was spent in counseling and coordination of care.   [name] MD      [address] [place] [address] Office [phone]   Beeper [phone]  11/04/2017 9:19 AM

## 2017-11-10 DIAGNOSIS — R351 Nocturia: Secondary | ICD-10-CM | POA: Diagnosis not present

## 2017-11-10 DIAGNOSIS — R972 Elevated prostate specific antigen [PSA]: Secondary | ICD-10-CM | POA: Diagnosis not present

## 2017-11-10 DIAGNOSIS — N401 Enlarged prostate with lower urinary tract symptoms: Secondary | ICD-10-CM | POA: Diagnosis not present

## 2017-11-18 ENCOUNTER — Other Ambulatory Visit: Payer: Self-pay | Admitting: Internal Medicine

## 2017-11-18 DIAGNOSIS — G4700 Insomnia, unspecified: Secondary | ICD-10-CM

## 2017-11-18 DIAGNOSIS — G4730 Sleep apnea, unspecified: Principal | ICD-10-CM

## 2017-12-15 ENCOUNTER — Telehealth: Payer: Self-pay | Admitting: Internal Medicine

## 2017-12-15 ENCOUNTER — Other Ambulatory Visit: Payer: Self-pay | Admitting: Internal Medicine

## 2017-12-15 DIAGNOSIS — G4730 Sleep apnea, unspecified: Principal | ICD-10-CM

## 2017-12-15 DIAGNOSIS — E118 Type 2 diabetes mellitus with unspecified complications: Secondary | ICD-10-CM

## 2017-12-15 DIAGNOSIS — G4700 Insomnia, unspecified: Secondary | ICD-10-CM

## 2017-12-15 DIAGNOSIS — E785 Hyperlipidemia, unspecified: Secondary | ICD-10-CM

## 2017-12-15 MED ORDER — ATORVASTATIN CALCIUM 20 MG PO TABS: 20.0000 mg | ORAL_TABLET | Freq: Every day | ORAL | 1 refills | Status: DC

## 2017-12-15 NOTE — Telephone Encounter (Signed)
Copied from [id number]. Topic: Quick Communication - See Telephone Encounter >> Dec 15, 2017  1:08 PM Conception Chancy, NT wrote: CRM for notification. See Telephone encounter for: 12/15/17.  Patient is calling and would like triazolam (HALCION) 0.25 MG tablet transferred from the [facility] in [place] to the one in [place] that I have provided below.  [id number] [address], [place] [facility] [place] [address] Phone: [phone] Fax: [phone]

## 2017-12-17 MED ORDER — TRIAZOLAM 0.25 MG PO TABS: 0.2500 mg | ORAL_TABLET | Freq: Every evening | ORAL | 1 refills | Status: DC | PRN

## 2017-12-20 NOTE — Telephone Encounter (Signed)
New rx faxed to [facility].

## 2017-12-20 NOTE — Telephone Encounter (Signed)
[facility]ed [facility] [address] and deleted rx on file.

## 2017-12-21 DIAGNOSIS — R351 Nocturia: Secondary | ICD-10-CM | POA: Diagnosis not present

## 2017-12-21 DIAGNOSIS — N401 Enlarged prostate with lower urinary tract symptoms: Secondary | ICD-10-CM | POA: Diagnosis not present

## 2018-01-16 ENCOUNTER — Other Ambulatory Visit: Payer: Self-pay | Admitting: Internal Medicine

## 2018-01-16 DIAGNOSIS — E118 Type 2 diabetes mellitus with unspecified complications: Secondary | ICD-10-CM

## 2018-01-16 DIAGNOSIS — E785 Hyperlipidemia, unspecified: Secondary | ICD-10-CM

## 2018-01-23 ENCOUNTER — Other Ambulatory Visit: Payer: Self-pay | Admitting: Cardiology

## 2018-02-03 ENCOUNTER — Ambulatory Visit (INDEPENDENT_AMBULATORY_CARE_PROVIDER_SITE_OTHER): Payer: Medicare HMO | Admitting: Surgery

## 2018-02-03 ENCOUNTER — Ambulatory Visit (INDEPENDENT_AMBULATORY_CARE_PROVIDER_SITE_OTHER): Payer: Medicare HMO

## 2018-02-03 ENCOUNTER — Encounter (INDEPENDENT_AMBULATORY_CARE_PROVIDER_SITE_OTHER): Payer: Self-pay | Admitting: Surgery

## 2018-02-03 DIAGNOSIS — M7041 Prepatellar bursitis, right knee: Secondary | ICD-10-CM | POA: Diagnosis not present

## 2018-02-03 DIAGNOSIS — M25561 Pain in right knee: Secondary | ICD-10-CM

## 2018-02-03 DIAGNOSIS — M1711 Unilateral primary osteoarthritis, right knee: Secondary | ICD-10-CM

## 2018-02-03 MED ORDER — LIDOCAINE HCL 1 % IJ SOLN
3.0000 mL | INTRAMUSCULAR | Status: AC | PRN
  Administered 2018-02-03: 3 mL

## 2018-02-03 MED ORDER — METHYLPREDNISOLONE ACETATE 40 MG/ML IJ SUSP
40.0000 mg | INTRAMUSCULAR | Status: AC | PRN
  Administered 2018-02-03: 40 mg via INTRA_ARTICULAR

## 2018-02-03 MED ORDER — BUPIVACAINE HCL 0.25 % IJ SOLN
6.0000 mL | INTRAMUSCULAR | Status: AC | PRN
  Administered 2018-02-03: 6 mL via INTRA_ARTICULAR

## 2018-02-03 NOTE — Progress Notes (Signed)
Office Visit Note   Patient: Clinton Black           Date of Birth: [birth year]           MRN: [id number] Visit Date: 02/03/2018              Requested by: [name], MD [address] [place], [place] [address] PCP: [name], MD   Assessment & Plan: Visit Diagnoses:  1. Right knee pain, unspecified chronicity   2. Prepatellar bursitis, right knee     Plan: To give some relief of patient's swelling I did offer aspiration and injection.  After patient consent I prepped the knee and attempted to perform joint aspiration but this was unsuccessful.  I then prepped the anterior knee and aspirated about 90 cc of bloody fluid from the prepatellar bursa.  I then injected Marcaine/Depo-Medrol 6-1.  Compression bandage applied.  Advised patient to wear this for at least a couple of days to help prevent recurrent swelling.  Patient understands that with the degenerative changes that he has in his right knee and ultimately will come down to him needing total knee replacement.  He will let us know when he is ready to have this done.  States that since he retired in January his knee is felt somewhat better since he does not have as much stress from working.  Follow-Up Instructions: Return in about 4 weeks (around 03/03/2018) for With Dr. Lorin Mercy.   Orders:  Orders Placed This Encounter  Procedures  . Large Joint Inj  . XR Knee 1-2 Views Right   No orders of the defined types were placed in this encounter.     Procedures: Right knee prepatellar bursa aspiration and injection on 02/03/2018 5:26 PM Indications: pain (Prepatellar bursal swelling) Details: 18 G 1.5 in needle, anterior approach Medications: 3 mL lidocaine 1 %; 6 mL bupivacaine 0.25 %; 40 mg methylPREDNISolone acetate 40 MG/ML Aspirate: 90 mL bloody  Patient tolerated procedure well.  I did aspirate about 90 cc of bloody fluid from the prepatellar bursa.  Fluid was not sent.   Consent was given by the  patient. Patient was prepped and draped in the usual sterile fashion.       Clinical Data: No additional findings.   Subjective: Chief Complaint  Patient presents with  . Right Knee - Pain    HPI 72year-old white male comes in today with complaints of right knee pain and swelling.  Patient states that he has known history of end-stage DJD right knee and has had treatments with flex agenic a few years ago.  He is also status post right knee arthroscopy with debridement by Dr. Lorin Mercy around 2000.  He had chronic issues with his knee but states that about a week and half ago he fell tripping over a box and suffered a direct anterior impact to the right knee.  Since then he said increased pain and swelling.  More start up pain but the further he walks the better the pain gets.   Review of Systems Patient admits to exertional dyspnea.  No current cardiac GI issues.  Objective: Vital Signs: There were no vitals taken for this visit.  Physical Exam  Constitutional: He is oriented to person, place, and time. No distress.  HENT:  Head: Normocephalic and atraumatic.  Eyes: Pupils are equal, round, and reactive to light. EOM are normal.  Neck: Normal range of motion.  Pulmonary/Chest: No respiratory distress.  Musculoskeletal:  Gait antalgic.  Right knee range of motion large amount of swelling.  Knee is diffusely tender.  Range of motion about 0 to 90 degrees.  Large prepatellar bursa is boggy. Stable.  Calf nontender.  Neurovascular intact.  Neurological: He is alert and oriented to person, place, and time.  Skin: Skin is warm and dry.    Ortho Exam  Specialty Comments:  No specialty comments available.  Imaging: Xr Knee 1-2 Views Right  Result Date: 02/03/2018 X-ray right knee shows tract acromial degenerative changes.  Medial compartment bone-on-bone.  Lateral view shows a question of a possible impaction injury of the condyle.    PMFS History: Patient Active Problem List     Diagnosis Date Noted  . Claudication of both lower extremities ([place]) 07/15/2017  . Chronic diastolic CHF (congestive heart failure), NYHA class 2 ([place]) 07/06/2017  . Mild tetrahydrocannabinol (THC) abuse 12/30/2016  . Essential hypertension 12/24/2016  . EKG abnormalities 12/24/2016  . Snoring 05/11/2016  . Hyperlipidemia LDL goal <130 05/11/2016  . DOE (dyspnea on exertion) 05/11/2016  . Type 2 diabetes mellitus with complication, without long-term current use of insulin ([place]) 05/11/2016  . Gross hematuria 02/11/2015  . Neoplasm of uncertain behavior of skin 10/28/2013  . Routine general medical examination at a health care facility 10/06/2013  . COLONIC POLYPS 05/28/2009  . Obesity, morbid ([place]) 05/28/2009  . 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 DEGENERATIVE JOINT DISEASE 05/28/2009  . Insomnia w/ sleep apnea 05/28/2009   Past Medical History:  Diagnosis Date  . Arthritis   . BPH (benign prostatic hyperplasia)   . Diabetes mellitus type 2 in obese ([place])   . HTN (hypertension)   . Hyperlipidemia, mixed   . Insomnia   . Obesity     Family History  Problem Relation Age of Onset  . Aneurysm Father   . Heart disease Father   . Varicose Veins Mother   . Arthritis Mother   . Macular degeneration Mother   . Cancer Neg Hx   . Colon cancer Neg Hx   . Esophageal cancer Neg Hx   . Rectal cancer Neg Hx   . Stomach cancer Neg Hx     Past Surgical History:  Procedure Laterality Date  . COLONOSCOPY    . CYST EXCISION     polynomial  . KNEE SURGERY  2000  . TONSILLECTOMY    . widson teeth extraction     Social History   Occupational History  . Not on file  Tobacco Use  . Smoking status: Former Smoker    Last attempt to quit: 06/15/1994    Years since quitting: 23.6  . Smokeless tobacco: Never Used  Substance and Sexual Activity  . Alcohol use: Yes    Alcohol/week: 14.0 standard drinks    Types: 14 Shots of liquor per week    Comment: 2 drinks of liquor per day   . Drug use: Yes    Comment: occasionally  . Sexual activity: Yes    Partners: Female    Birth control/protection: Condom    Comment: condom use most of the time but not always

## 2018-02-04 ENCOUNTER — Telehealth (INDEPENDENT_AMBULATORY_CARE_PROVIDER_SITE_OTHER): Payer: Self-pay | Admitting: Radiology

## 2018-02-04 NOTE — Telephone Encounter (Signed)
Can you please work patient in some time Wednesday, Aug 28? Jeneen Rinks wants Dr. Lorin Mercy to follow up with patient in one week. Patient's appt with Jeneen Rinks was 8/22. Thanks.

## 2018-02-04 NOTE — Telephone Encounter (Signed)
Patient was seen by Jeneen Rinks 02/03/18 had 90cc of fluid aspirated from right knee bursa. Jeneen Rinks wanted patient to follow up with Dr. Lorin Mercy in 1 week, his scheduled is already over booked. I scheduled patient for Friday 02/18/18, advised if he could be worked in sooner he will be called, if he is not called keep the 9/6 appointment.

## 2018-02-09 ENCOUNTER — Ambulatory Visit (INDEPENDENT_AMBULATORY_CARE_PROVIDER_SITE_OTHER): Payer: Medicare HMO | Admitting: Orthopaedic Surgery

## 2018-02-09 ENCOUNTER — Encounter (INDEPENDENT_AMBULATORY_CARE_PROVIDER_SITE_OTHER): Payer: Self-pay | Admitting: Orthopaedic Surgery

## 2018-02-09 VITALS — BP 150/69 | HR 60 | Ht 77.0 in | Wt 280.0 lb

## 2018-02-09 DIAGNOSIS — M7051 Other bursitis of knee, right knee: Secondary | ICD-10-CM | POA: Diagnosis not present

## 2018-02-09 MED ORDER — LIDOCAINE HCL 1 % IJ SOLN
0.5000 mL | INTRAMUSCULAR | Status: AC | PRN
  Administered 2018-02-09: .5 mL

## 2018-02-09 NOTE — Progress Notes (Signed)
Office Visit Note   Patient: Clinton Black           Date of Birth: 12-12-1945           MRN: [id number] Visit Date: 02/09/2018              Requested by: [name], MD [address] [place], [place] [address] PCP: [name], MD   Assessment & Plan: Visit Diagnoses:  1. Other bursitis of knee, right knee          Right pre-patellar bursa recurrent hematoma.  Plan: After copious Betadine prep prepatellar bursa was aspirated of 60 cc dark bloody fluid.  Compressive wrap with Ace wrap was applied which we will leave on for 2 days then he can remove it to shower and then reapply the Ace wrap.  He will return if he has persistent problems.  Follow-Up Instructions: No follow-ups on file.   Orders:  Orders Placed This Encounter  Procedures  . Large Joint Inj  . Medium Joint Inj  . Large Joint Inj  . Incision & Drainage  . Trigger Point Inj  . Small Joint Inj  . Medium Joint Inj  . Large Joint Inj  . Large Joint Inj   No orders of the defined types were placed in this encounter.     Procedures: Large Joint Inj: R knee pre-patellar  on 02/09/2018 3:47 PM Indications: pain and joint swelling Details: 22 G 1.5 in needle, anterolateral approach  Arthrogram: No  Medications: 0.5 mL lidocaine 1 % Aspirate: 60 mL bloody Outcome: tolerated well, no immediate complications  Right large pre-patellar bursa hematoma aspiration  Procedure, treatment alternatives, risks and benefits explained, specific risks discussed. Consent was given by the patient. Immediately prior to procedure a time out was called to verify the correct patient, procedure, equipment, support staff and site/side marked as required. Patient was prepped and draped in the usual sterile fashion.        Clinical Data: No additional findings.   Subjective: Chief Complaint  Patient presents with  . Right Knee - Pain    HPI 72 year old male former [place]  returns for recurrent right knee prepatellar bursa hematoma.  He had 90 cc dark bloody fluid aspirated from the prepatellar bursa on 02/03/2018 and is had recurrence.  He is requesting repeat aspiration.  He denies fever or chills no history of trauma he is not able to get down on his knee.  He said no catching in his knee no groin pain.  Review of Systems review of systems updated unchanged from 02/03/2018 14 point systems other than as mentioned in HPI.   Objective: Vital Signs: BP (!) 150/69   Pulse 60   Ht 6\' 5"  (1.956 m)   Wt 280 lb (127 kg)   BMI 33.20 kg/m   Physical Exam  Constitutional: He is oriented to person, place, and time. He appears well-developed and well-nourished.  HENT:  Head: Normocephalic and atraumatic.  Eyes: Pupils are equal, round, and reactive to light. EOM are normal.  Neck: No tracheal deviation present. No thyromegaly present.  Cardiovascular: Normal rate.  Pulmonary/Chest: Effort normal. He has no wheezes.  Abdominal: Soft. Bowel sounds are normal.  Neurological: He is alert and oriented to person, place, and time.  Skin: Skin is warm and dry. Capillary refill takes less than 2 seconds.  Psychiatric: He has a normal mood and affect. His behavior is normal. Judgment and thought content  normal.    Ortho Exam patient has palpable obvious right prepatellar bursal fluid without erythema.  It extends over the VMO and the subtenons tissue with pressure he has some pain in this region worse dissected underneath subcutaneous tissue.  Sensation of the foot is intact normal hip range of motion negative straight leg raising 90 degrees no true knee effusion.  Knee ligamentous exam is normal pes bursa is normal.  He can do a straight leg raise quad tendon is intact minimal crepitus with knee extension.  Well-healed old arthroscopic portals. Specialty Comments:  No specialty comments available.  Imaging: No results found.   PMFS History: Patient Active Problem List     Diagnosis Date Noted  . Claudication of both lower extremities ([place]) 07/15/2017  . Chronic diastolic CHF (congestive heart failure), NYHA class 2 ([place]) 07/06/2017  . Mild tetrahydrocannabinol (THC) abuse 12/30/2016  . Essential hypertension 12/24/2016  . EKG abnormalities 12/24/2016  . Snoring 05/11/2016  . Hyperlipidemia LDL goal <130 05/11/2016  . DOE (dyspnea on exertion) 05/11/2016  . Type 2 diabetes mellitus with complication, without long-term current use of insulin ([place]) 05/11/2016  . Gross hematuria 02/11/2015  . Neoplasm of uncertain behavior of skin 10/28/2013  . Routine general medical examination at a health care facility 10/06/2013  . COLONIC POLYPS 05/28/2009  . Obesity, morbid ([place]) 05/28/2009  . 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 DEGENERATIVE JOINT DISEASE 05/28/2009  . Insomnia w/ sleep apnea 05/28/2009   Past Medical History:  Diagnosis Date  . Arthritis   . BPH (benign prostatic hyperplasia)   . Diabetes mellitus type 2 in obese ([place])   . HTN (hypertension)   . Hyperlipidemia, mixed   . Insomnia   . Obesity     Family History  Problem Relation Age of Onset  . Aneurysm Father   . Heart disease Father   . Varicose Veins Mother   . Arthritis Mother   . Macular degeneration Mother   . Cancer Neg Hx   . Colon cancer Neg Hx   . Esophageal cancer Neg Hx   . Rectal cancer Neg Hx   . Stomach cancer Neg Hx     Past Surgical History:  Procedure Laterality Date  . COLONOSCOPY    . CYST EXCISION     polynomial  . KNEE SURGERY  2000  . TONSILLECTOMY    . widson teeth extraction     Social History   Occupational History  . Not on file  Tobacco Use  . Smoking status: Former Smoker    Last attempt to quit: 06/15/1994    Years since quitting: 23.6  . Smokeless tobacco: Never Used  Substance and Sexual Activity  . Alcohol use: Yes    Alcohol/week: 14.0 standard drinks    Types: 14 Shots of liquor per week    Comment: 2 drinks of liquor per day   . Drug use: Yes    Comment: occasionally  . Sexual activity: Yes    Partners: Female    Birth control/protection: Condom    Comment: condom use most of the time but not always

## 2018-02-18 ENCOUNTER — Ambulatory Visit (INDEPENDENT_AMBULATORY_CARE_PROVIDER_SITE_OTHER): Payer: Medicare HMO | Admitting: Orthopaedic Surgery

## 2018-02-18 ENCOUNTER — Encounter (INDEPENDENT_AMBULATORY_CARE_PROVIDER_SITE_OTHER): Payer: Self-pay | Admitting: Orthopaedic Surgery

## 2018-02-18 DIAGNOSIS — M7041 Prepatellar bursitis, right knee: Secondary | ICD-10-CM | POA: Diagnosis not present

## 2018-02-18 NOTE — Progress Notes (Signed)
72 year old white male returns for follow-up visit for right knee patellar bursitis.  Continues have some swelling but this is greatly improved from my initial visit with him when I aspirated 90 cc of blood.  Pain and range of motion better.   Exam Pleasant white male alert and oriented no acute distress.  Gait is normal.  Knee range of motion about 0 to 115 degrees.  Does continue to have some swelling of the patella bursa but again this is greatly improved.  No signs of infection.  No redness.  Plan I will see patient back in 5 weeks for recheck.  He can wear the knee sleeve that he has.  Patient understands that if he gets a significant amount of recurrent swelling that he may end up needing prepatellar bursa excision.  All questions answered.  He will avoid putting direct pressure on his knee.

## 2018-02-19 ENCOUNTER — Other Ambulatory Visit: Payer: Self-pay | Admitting: Internal Medicine

## 2018-02-19 DIAGNOSIS — G4730 Sleep apnea, unspecified: Principal | ICD-10-CM

## 2018-02-19 DIAGNOSIS — G4700 Insomnia, unspecified: Secondary | ICD-10-CM

## 2018-03-02 ENCOUNTER — Ambulatory Visit (INDEPENDENT_AMBULATORY_CARE_PROVIDER_SITE_OTHER): Payer: Medicare HMO | Admitting: Orthopaedic Surgery

## 2018-03-02 ENCOUNTER — Other Ambulatory Visit: Payer: Self-pay | Admitting: Cardiology

## 2018-03-04 ENCOUNTER — Ambulatory Visit (INDEPENDENT_AMBULATORY_CARE_PROVIDER_SITE_OTHER): Payer: Medicare HMO | Admitting: Orthopaedic Surgery

## 2018-03-23 ENCOUNTER — Other Ambulatory Visit: Payer: Self-pay | Admitting: Urology

## 2018-03-25 ENCOUNTER — Other Ambulatory Visit: Payer: Self-pay | Admitting: Cardiothoracic Surgery

## 2018-03-25 DIAGNOSIS — I712 Thoracic aortic aneurysm, without rupture, unspecified: Secondary | ICD-10-CM

## 2018-03-25 DIAGNOSIS — I351 Nonrheumatic aortic (valve) insufficiency: Secondary | ICD-10-CM

## 2018-03-25 NOTE — Progress Notes (Unsigned)
Ct a 

## 2018-03-29 ENCOUNTER — Other Ambulatory Visit: Payer: Self-pay

## 2018-03-29 ENCOUNTER — Ambulatory Visit (HOSPITAL_COMMUNITY): Payer: Medicare HMO | Attending: Cardiothoracic Surgery

## 2018-03-29 DIAGNOSIS — I351 Nonrheumatic aortic (valve) insufficiency: Secondary | ICD-10-CM | POA: Insufficient documentation

## 2018-03-30 NOTE — Telephone Encounter (Signed)
error 

## 2018-04-12 ENCOUNTER — Encounter (HOSPITAL_BASED_OUTPATIENT_CLINIC_OR_DEPARTMENT_OTHER): Payer: Self-pay | Admitting: *Deleted

## 2018-04-12 ENCOUNTER — Other Ambulatory Visit: Payer: Self-pay

## 2018-04-12 NOTE — Progress Notes (Signed)
Spoke with Clinton Black  Npo after midnight food, clear liquids until 845 am, then npo arrive 1245 04-18-18 [facility] Transportation is logistics care per [place], patient to check and make sure driver will sign for transportation home, patient states he has someone at home to spend the night after surgery 04-18-18. No meds to take am, patient states he will bring prescription of cefuroxime dr Alyson Ingles instructed him to take 30 minutes prior to surgery. lov cardiology dr [name] 09-29-17 chart/epic lov cardio/thoracic dr gerhardt 11-04-17 chart/epic Has surgery orders in epic ekg [id number] epic/chart

## 2018-04-18 ENCOUNTER — Ambulatory Visit (HOSPITAL_BASED_OUTPATIENT_CLINIC_OR_DEPARTMENT_OTHER): Payer: Medicare HMO | Admitting: Anesthesiology

## 2018-04-18 ENCOUNTER — Ambulatory Visit (HOSPITAL_BASED_OUTPATIENT_CLINIC_OR_DEPARTMENT_OTHER)
Admission: RE | Admit: 2018-04-18 | Discharge: 2018-04-18 | Disposition: A | Discharge status: Home / Self Care | Payer: Medicare HMO | Source: Ambulatory Visit | Attending: Urology | Admitting: Urology

## 2018-04-18 ENCOUNTER — Encounter (HOSPITAL_BASED_OUTPATIENT_CLINIC_OR_DEPARTMENT_OTHER): Payer: Self-pay | Admitting: Anesthesiology

## 2018-04-18 ENCOUNTER — Encounter (HOSPITAL_BASED_OUTPATIENT_CLINIC_OR_DEPARTMENT_OTHER)
Admission: RE | Disposition: A | Discharge status: Home / Self Care | Payer: Self-pay | Source: Ambulatory Visit | Attending: Urology

## 2018-04-18 DIAGNOSIS — N320 Bladder-neck obstruction: Secondary | ICD-10-CM | POA: Diagnosis not present

## 2018-04-18 DIAGNOSIS — Z87891 Personal history of nicotine dependence: Secondary | ICD-10-CM | POA: Insufficient documentation

## 2018-04-18 DIAGNOSIS — E669 Obesity, unspecified: Secondary | ICD-10-CM | POA: Diagnosis not present

## 2018-04-18 DIAGNOSIS — I712 Thoracic aortic aneurysm, without rupture: Secondary | ICD-10-CM | POA: Diagnosis not present

## 2018-04-18 DIAGNOSIS — Z6833 Body mass index (BMI) 33.0-33.9, adult: Secondary | ICD-10-CM | POA: Insufficient documentation

## 2018-04-18 DIAGNOSIS — R6889 Other general symptoms and signs: Secondary | ICD-10-CM | POA: Diagnosis not present

## 2018-04-18 DIAGNOSIS — I10 Essential (primary) hypertension: Secondary | ICD-10-CM | POA: Insufficient documentation

## 2018-04-18 DIAGNOSIS — R3914 Feeling of incomplete bladder emptying: Secondary | ICD-10-CM | POA: Insufficient documentation

## 2018-04-18 DIAGNOSIS — E1151 Type 2 diabetes mellitus with diabetic peripheral angiopathy without gangrene: Secondary | ICD-10-CM | POA: Diagnosis not present

## 2018-04-18 DIAGNOSIS — N132 Hydronephrosis with renal and ureteral calculous obstruction: Secondary | ICD-10-CM | POA: Diagnosis not present

## 2018-04-18 DIAGNOSIS — N138 Other obstructive and reflux uropathy: Secondary | ICD-10-CM | POA: Insufficient documentation

## 2018-04-18 DIAGNOSIS — N401 Enlarged prostate with lower urinary tract symptoms: Secondary | ICD-10-CM | POA: Diagnosis not present

## 2018-04-18 DIAGNOSIS — R350 Frequency of micturition: Secondary | ICD-10-CM | POA: Diagnosis not present

## 2018-04-18 HISTORY — DX: Thoracic aortic aneurysm, without rupture, unspecified: I71.20

## 2018-04-18 HISTORY — PX: CYSTOSCOPY WITH INSERTION OF UROLIFT: SHX6678

## 2018-04-18 HISTORY — DX: Thoracic aortic aneurysm, without rupture: I71.2

## 2018-04-18 LAB — POCT I-STAT 4, (NA,K, GLUC, HGB,HCT)
Glucose, Bld: 113 mg/dL — ABNORMAL HIGH (ref 70–99)
HCT: 42 % (ref 39.0–52.0)
Hemoglobin: 14.3 g/dL (ref 13.0–17.0)
Potassium: 4.5 mmol/L (ref 3.5–5.1)
Sodium: 140 mmol/L (ref 135–145)

## 2018-04-18 SURGERY — CYSTOSCOPY WITH INSERTION OF UROLIFT
Anesthesia: General | Site: Bladder

## 2018-04-18 MED ORDER — PROPOFOL 10 MG/ML IV BOLUS
INTRAVENOUS | Status: AC
  Filled 2018-04-18: qty 20

## 2018-04-18 MED ORDER — CEFAZOLIN SODIUM-DEXTROSE 2-4 GM/100ML-% IV SOLN
INTRAVENOUS | Status: AC
  Filled 2018-04-18: qty 100

## 2018-04-18 MED ORDER — TRAMADOL HCL 50 MG PO TABS: 50.0000 mg | ORAL_TABLET | Freq: Four times a day (QID) | ORAL | 0 refills | Status: DC | PRN

## 2018-04-18 MED ORDER — LIDOCAINE 2% (20 MG/ML) 5 ML SYRINGE
INTRAMUSCULAR | Status: DC | PRN
  Administered 2018-04-18: 100 mg via INTRAVENOUS

## 2018-04-18 MED ORDER — DEXTROSE 5 % IV SOLN
3.0000 g | INTRAVENOUS | Status: DC
  Filled 2018-04-18: qty 3000

## 2018-04-18 MED ORDER — STERILE WATER FOR IRRIGATION IR SOLN
Status: DC | PRN
  Administered 2018-04-18: 3000 mL via INTRAVESICAL

## 2018-04-18 MED ORDER — DEXAMETHASONE SODIUM PHOSPHATE 10 MG/ML IJ SOLN
INTRAMUSCULAR | Status: AC
  Filled 2018-04-18: qty 1

## 2018-04-18 MED ORDER — MIDAZOLAM HCL 5 MG/5ML IJ SOLN
INTRAMUSCULAR | Status: DC | PRN
  Administered 2018-04-18: 1 mg via INTRAVENOUS

## 2018-04-18 MED ORDER — ONDANSETRON HCL 4 MG/2ML IJ SOLN
INTRAMUSCULAR | Status: AC
  Filled 2018-04-18: qty 2

## 2018-04-18 MED ORDER — LACTATED RINGERS IV SOLN
INTRAVENOUS | Status: DC
  Administered 2018-04-18: 14:00:00 via INTRAVENOUS
  Filled 2018-04-18: qty 1000

## 2018-04-18 MED ORDER — PROPOFOL 10 MG/ML IV BOLUS
INTRAVENOUS | Status: DC | PRN
  Administered 2018-04-18: 120 mg via INTRAVENOUS

## 2018-04-18 MED ORDER — ONDANSETRON HCL 4 MG/2ML IJ SOLN
INTRAMUSCULAR | Status: DC | PRN
  Administered 2018-04-18: 4 mg via INTRAVENOUS

## 2018-04-18 MED ORDER — ACETAMINOPHEN 500 MG PO TABS
ORAL_TABLET | ORAL | Status: AC
  Filled 2018-04-18: qty 2

## 2018-04-18 MED ORDER — CEFAZOLIN SODIUM-DEXTROSE 1-4 GM/50ML-% IV SOLN
INTRAVENOUS | Status: AC
  Filled 2018-04-18: qty 50

## 2018-04-18 MED ORDER — FENTANYL CITRATE (PF) 100 MCG/2ML IJ SOLN
INTRAMUSCULAR | Status: DC | PRN
  Administered 2018-04-18: 50 ug via INTRAVENOUS

## 2018-04-18 MED ORDER — FENTANYL CITRATE (PF) 100 MCG/2ML IJ SOLN
INTRAMUSCULAR | Status: AC
  Filled 2018-04-18: qty 2

## 2018-04-18 MED ORDER — DEXAMETHASONE SODIUM PHOSPHATE 4 MG/ML IJ SOLN
INTRAMUSCULAR | Status: DC | PRN
  Administered 2018-04-18: 10 mg via INTRAVENOUS

## 2018-04-18 MED ORDER — ACETAMINOPHEN 500 MG PO TABS
1000.0000 mg | ORAL_TABLET | Freq: Once | ORAL | Status: AC
  Administered 2018-04-18: 1000 mg via ORAL
  Filled 2018-04-18: qty 2

## 2018-04-18 MED ORDER — MIDAZOLAM HCL 2 MG/2ML IJ SOLN
INTRAMUSCULAR | Status: AC
  Filled 2018-04-18: qty 2

## 2018-04-18 MED ORDER — FENTANYL CITRATE (PF) 100 MCG/2ML IJ SOLN
25.0000 ug | INTRAMUSCULAR | Status: DC | PRN
  Filled 2018-04-18: qty 1

## 2018-04-18 MED ORDER — CEFAZOLIN SODIUM-DEXTROSE 2-4 GM/100ML-% IV SOLN
2.0000 g | INTRAVENOUS | Status: DC
  Administered 2018-04-18: 3 g via INTRAVENOUS
  Filled 2018-04-18: qty 100

## 2018-04-18 MED ORDER — LIDOCAINE 2% (20 MG/ML) 5 ML SYRINGE
INTRAMUSCULAR | Status: AC
  Filled 2018-04-18: qty 5

## 2018-04-18 MED ORDER — ONDANSETRON HCL 4 MG/2ML IJ SOLN
4.0000 mg | Freq: Once | INTRAMUSCULAR | Status: DC | PRN
  Filled 2018-04-18: qty 2

## 2018-04-18 SURGICAL SUPPLY — 15 items
BAG DRAIN URO-CYSTO SKYTR STRL (DRAIN) ×3 IMPLANT
BAG DRN UROCATH (DRAIN) ×1
BAG URINE DRAINAGE (UROLOGICAL SUPPLIES) ×3 IMPLANT
CATH FOLEY 2WAY SLVR  5CC 16FR (CATHETERS) ×2
CATH FOLEY 2WAY SLVR 5CC 16FR (CATHETERS) ×1 IMPLANT
CLOTH BEACON ORANGE TIMEOUT ST (SAFETY) ×3 IMPLANT
GLOVE BIO SURGEON STRL SZ8 (GLOVE) ×3 IMPLANT
GOWN STRL REUS W/TWL XL LVL3 (GOWN DISPOSABLE) ×3 IMPLANT
HOLDER FOLEY CATH W/STRAP (MISCELLANEOUS) ×3 IMPLANT
IV NS IRRIG 3000ML ARTHROMATIC (IV SOLUTION) ×6 IMPLANT
MANIFOLD NEPTUNE II (INSTRUMENTS) ×3 IMPLANT
PACK CYSTO (CUSTOM PROCEDURE TRAY) ×3 IMPLANT
SYSTEM UROLIFT (Male Continence) ×12 IMPLANT
TUBE CONNECTING 12'X1/4 (SUCTIONS) ×1
TUBE CONNECTING 12X1/4 (SUCTIONS) ×2 IMPLANT

## 2018-04-18 NOTE — Transfer of Care (Signed)
  Last Vitals:  Vitals Value Taken Time  BP 139/66 04/18/2018  3:15 PM  Temp    Pulse 53 04/18/2018  3:18 PM  Resp 15 04/18/2018  3:18 PM  SpO2 96 % 04/18/2018  3:18 PM  Vitals shown include unvalidated device data.  Last Pain:  Vitals:   04/18/18 1320  TempSrc:   PainSc: 0-No pain      Patients Stated Pain Goal: 7 (04/18/18 1320)  Immediate Anesthesia Transfer of Care Note  Patient: Clinton Black  Procedure(s) Performed: Procedure(s) (LRB): CYSTOSCOPY WITH INSERTION OF UROLIFT (N/A)  Patient Location: PACU  Anesthesia Type: General  Level of Consciousness: awake, alert  and oriented  Airway & Oxygen Therapy: Patient Spontanous Breathing and Patient connected to nasal cannula oxygen  Post-op Assessment: Report given to PACU RN and Post -op Vital signs reviewed and stable  Post vital signs: Reviewed and stable  Complications: No apparent anesthesia complications

## 2018-04-18 NOTE — Discharge Instructions (Signed)
Indwelling Urinary Catheter Care, Adult °Take good care of your catheter to keep it working and to prevent problems. °How to wear your catheter °Attach your catheter to your leg with tape (adhesive tape) or a leg strap. Make sure it is not too tight. If you use tape, remove any bits of tape that are already on the catheter. °How to wear a drainage bag °You should have: °· A large overnight bag. °· A small leg bag. ° °Overnight Bag °You may wear the overnight bag at any time. Always keep the bag below the level of your bladder but off the floor. When you sleep, put a clean plastic bag in a wastebasket. Then hang the bag inside the wastebasket. °Leg Bag °Never wear the leg bag at night. Always wear the leg bag below your knee. Keep the leg bag secure with a leg strap or tape. °How to care for your skin °· Clean the skin around the catheter at least once every day. °· Shower every day. Do not take baths. °· Put creams, lotions, or ointments on your genital area only as told by your doctor. °· Do not use powders, sprays, or lotions on your genital area. °How to clean your catheter and your skin °1. Wash your hands with soap and water. °2. Wet a washcloth in warm water and gentle (mild) soap. °3. Use the washcloth to clean the skin where the catheter enters your body. Clean downward and wipe away from the catheter in small circles. Do not wipe toward the catheter. °4. Pat the area dry with a clean towel. Make sure to clean off all soap. °How to care for your drainage bags °Empty your drainage bag when it is ?-½ full or at least 2-3 times a day. Replace your drainage bag once a month or sooner if it starts to smell bad or look dirty. Do not clean your drainage bag unless told by your doctor. °Emptying a drainage bag ° °Supplies Needed °· Rubbing alcohol. °· Gauze pad or cotton ball. °· Tape or a leg strap. ° °Steps °1. Wash your hands with soap and water. °2. Separate (detach) the bag from your leg. °3. Hold the bag over  the toilet or a clean container. Keep the bag below your hips and bladder. This stops pee (urine) from going back into the tube. °4. Open the pour spout at the bottom of the bag. °5. Empty the pee into the toilet or container. Do not let the pour spout touch any surface. °6. Put rubbing alcohol on a gauze pad or cotton ball. °7. Use the gauze pad or cotton ball to clean the pour spout. °8. Close the pour spout. °9. Attach the bag to your leg with tape or a leg strap. °10. Wash your hands. ° °Changing a drainage bag °Supplies Needed °· Alcohol wipes. °· A clean drainage bag. °· Adhesive tape or a leg strap. ° °Steps °1. Wash your hands with soap and water. °2. Separate the dirty bag from your leg. °3. Pinch the rubber catheter with your fingers so that pee does not spill out. °4. Separate the catheter tube from the drainage tube where these tubes connect (at the connection valve). Do not let the tubes touch any surface. °5. Clean the end of the catheter tube with an alcohol wipe. Use a different alcohol wipe to clean the end of the drainage tube. °6. Connect the catheter tube to the drainage tube of the clean bag. °7. Attach the new bag to   the leg with adhesive tape or a leg strap. °8. Wash your hands. ° °How to prevent infection and other problems °· Never pull on your catheter or try to remove it. Pulling can damage tissue in your body. °· Always wash your hands before and after touching your catheter. °· If a leg strap gets wet, replace it with a dry one. °· Drink enough fluids to keep your pee clear or pale yellow, or as told by your doctor. °· Do not let the drainage bag or tubing touch the floor. °· Wear cotton underwear. °· If you are male, wipe from front to back after you poop (have a bowel movement). °· Check on the catheter often to make sure it works and the tubing is not twisted. °Get help if: °· Your pee is cloudy. °· Your pee smells unusually bad. °· Your pee is not draining into the bag. °· Your  tube gets clogged. °· Your catheter starts to leak. °· Your bladder feels full. °Get help right away if: °· You have redness, swelling, or pain where the catheter enters your body. °· You have fluid, pus, or a bad smell coming from the area where the catheter enters your body. °· The area where the catheter enters your body feels warm. °· You have a fever. °· You have pain in your: °? Stomach (abdomen). °? Legs. °? Lower back. °? Bladder. °· You see blood fill the catheter. °· Your pee is pink or red. °· You feel sick to your stomach (nauseous). °· You throw up (vomit). °· You have chills. °· Your catheter gets pulled out. °This information is not intended to replace advice given to you by your health care provider. Make sure you discuss any questions you have with your health care provider. °Document Released: 09/26/2012 Document Revised: 04/29/2016 Document Reviewed: 11/14/2013 °Elsevier Interactive Patient Education © 2018 [employer] ° ° ° ° °Post Anesthesia Home Care Instructions ° °Activity: °Get plenty of rest for the remainder of the day. A responsible individual must stay with you for 24 hours following the procedure.  °For the next 24 hours, DO NOT: °-Drive a car °-Operate machinery °-Drink alcoholic beverages °-Take any medication unless instructed by your physician °-Make any legal decisions or sign important papers. ° °Meals: °Start with liquid foods such as gelatin or soup. Progress to regular foods as tolerated. Avoid greasy, spicy, heavy foods. If nausea and/or vomiting occur, drink only clear liquids until the nausea and/or vomiting subsides. Call your physician if vomiting continues. ° °Special Instructions/Symptoms: °Your throat may feel dry or sore from the anesthesia or the breathing tube placed in your throat during surgery. If this causes discomfort, gargle with warm salt water. The discomfort should disappear within 24 hours. ° °If you had a scopolamine patch placed behind your ear for the  management of post- operative nausea and/or vomiting: ° °1. The medication in the patch is effective for 72 hours, after which it should be removed.  Wrap patch in a tissue and discard in the trash. Wash hands thoroughly with soap and water. °2. You may remove the patch earlier than 72 hours if you experience unpleasant side effects which may include dry mouth, dizziness or visual disturbances. °3. Avoid touching the patch. Wash your hands with soap and water after contact with the patch. °  ° °

## 2018-04-18 NOTE — H&P (Signed)
Urology Admission H&P  Chief Complaint: urinary freuency, incomplete emptying  History of Present Illness: Mr Clinton Black is a 72yo with a hx of BPH with LUTS who has failed medical therapy. He has severe urinary frequency, urgency and incomplete emptying. No fevers/chills/sweats. No nause/vomiting  Past Medical History:  Diagnosis Date  . Arthritis   . BPH (benign prostatic hyperplasia)   . Diabetes mellitus type 2 in obese (HCC)    diet controlled  . HTN (hypertension)   . Hyperlipidemia, mixed   . Insomnia   . Obesity   . Thoracic aortic aneurysm, without rupture (HCC)    4.7 cm per chest ct with contrast 11-04-17   Past Surgical History:  Procedure Laterality Date  . COLONOSCOPY  2016  . CYST EXCISION     polynomial  . KNEE SURGERY Right 2000   arthroscopy  . TONSILLECTOMY    . widson teeth extraction      Home Medications:  Current Facility-Administered Medications  Medication Dose Route Frequency Provider Last Rate Last Dose  . ceFAZolin (ANCEF) 3 g in dextrose 5 % 50 mL IVPB  3 g Intravenous 30 min Pre-Op McKenzie, Candee Furbish, MD      . lactated ringers infusion   Intravenous Continuous Catalina Gravel, MD 50 mL/hr at 04/18/18 1345     Allergies: No Known Allergies  Family History  Problem Relation Age of Onset  . Aneurysm Father   . Heart disease Father   . Varicose Veins Mother   . Arthritis Mother   . Macular degeneration Mother   . Cancer Neg Hx   . Colon cancer Neg Hx   . Esophageal cancer Neg Hx   . Rectal cancer Neg Hx   . Stomach cancer Neg Hx    Social History:  reports that he quit smoking about 23 years ago. His smoking use included cigarettes. He has a 7.50 pack-year smoking history. He has never used smokeless tobacco. He reports that he drinks about 14.0 standard drinks of alcohol per week. He reports that he has current or past drug history. Drug: Marijuana.  Review of Systems  Genitourinary: Positive for frequency and urgency.  All other  systems reviewed and are negative.   Physical Exam:  Vital signs in last 24 hours: Temp:  [97.7 F (36.5 C)] 97.7 F (36.5 C) (11/04 1237) Pulse Rate:  [51] 51 (11/04 1237) Resp:  [18] 18 (11/04 1237) BP: (154)/(63) 154/63 (11/04 1237) SpO2:  [99 %] 99 % (11/04 1237) Weight:  [128.1 kg] 128.1 kg (11/04 1237) Physical Exam  Constitutional: He is oriented to person, place, and time. He appears well-developed and well-nourished.  HENT:  Head: Normocephalic and atraumatic.  Eyes: Pupils are equal, round, and reactive to light. EOM are normal.  Neck: Normal range of motion. No thyromegaly present.  Cardiovascular: Normal rate and regular rhythm.  Respiratory: Effort normal. No respiratory distress.  GI: Soft. He exhibits no distension.  Musculoskeletal: Normal range of motion. He exhibits no edema.  Neurological: He is alert and oriented to person, place, and time.  Skin: Skin is warm and dry.  Psychiatric: He has a normal mood and affect. His behavior is normal. Judgment and thought content normal.    Laboratory Data:  Results for orders placed or performed during the hospital encounter of 04/18/18 (from the past 24 hour(s))  I-STAT 4, (NA,K, GLUC, HGB,HCT)     Status: Abnormal   Collection Time: 04/18/18  1:47 PM  Result Value Ref Range  Sodium 140 135 - 145 mmol/L   Potassium 4.5 3.5 - 5.1 mmol/L   Glucose, Bld 113 (H) 70 - 99 mg/dL   HCT 42.0 39.0 - 52.0 %   Hemoglobin 14.3 13.0 - 17.0 g/dL   No results found for this or any previous visit (from the past 240 hour(s)). Creatinine: No results for input(s): CREATININE in the last 168 hours. Baseline Creatinine: unknown  Impression/Assessment:  71yo with BPh with incomplete emptying  Plan:  The risks/benefits/alternatives to Urolift was explained to the patient and he understands and wishes to proceed with surgery  [name] 04/18/2018, 2:29 PM

## 2018-04-18 NOTE — Op Note (Signed)
   PREOPERATIVE DIAGNOSIS: Benign prostatic hypertrophy with bladder outlet obstruction and incomplete emptying.  POSTOPERATIVE DIAGNOSIS: Benign prostatic hypertrophy with bladder outlet obstruction and incomplete emptying.  PROCEDURE: Cystoscopy with implantation of UroLift devices, 6 implants.  SURGEON: [name], M.D.  ANESTHESIA: General  ANTIBIOTICS: ancef  SPECIMEN: None.  DRAINS: A 16-French Foley catheter.  BLOOD LOSS: Minimal.  COMPLICATIONS: None.  INDICATIONS:The Patient is an 72 year old male with BPH and bladder outlet obstruction with incomplete emptying. He has failed medical therapy and has elected UroLift for definitive treatment.  FINDINGS OF PROCEDURE: He was taken to the operating room where a genral anesthetic was induced. He was placed in lithotomy position and was fitted with PAS hose. His perineum and genitalia were prepped with chlorhexidine, and he was draped in usual sterile fashion.  Cystoscopy was performed using the UroLift scope and 0 degree lens. Examination revealed a normal urethra. The external sphincter was intact. Prostatic urethra was approximately 4 cm in length with lateral lobe enlargement. There was also little bit of bladder neck elevation. Inspection of bladder revealed mild-to-moderate trabeculation with no tumors, stones, or inflammation. No cellules or diverticula were noted. Ureteral orifices were in their normal anatomic position effluxing clear urine.  After initial cystoscopy, the visual obturator was replaced with the first UroLift device. This was turned to the 9 o'clock position and pulled back to the veru and then slightly advanced. Pressure was then applied to the right lateral lobe and the UroLift device was deployed.  The second UroLift device was then inserted and applied to the left lateral lobe at 3 o'clock and deployed in the mid prostatic urethra. After this, there was still  some apparent obstruction closer to the bladder neck. So a second level of UroLift your left device was applied between the mid urethra and the proximal urethra providing further patency to the prostatic urethra. At this point, there was mild Bleeding so it was thought that a Foley catheter was indicated. The scope was removed and a 16-French Foley catheter was inserted without difficulty. The balloon was filled with 10 mL sterile fluid, and the catheter was placed to straight drainage.  COMPLICATIONS: None   CONDITION: Stable, extubated, transferred to PACU  PLAN: The patient will be discharged home and followup in 2 days for a voiding trial.

## 2018-04-18 NOTE — Anesthesia Preprocedure Evaluation (Addendum)
Anesthesia Evaluation  Patient identified by MRN, date of birth, ID band Patient awake    Reviewed: Allergy & Precautions, NPO status , Patient's Chart, lab work & pertinent test results, reviewed documented beta blocker date and time   Airway Mallampati: I  TM Distance: <3 FB Neck ROM: Full    Dental  (+) Teeth Intact, Dental Advisory Given, Chipped,    Pulmonary former smoker,    Pulmonary exam normal breath sounds clear to auscultation       Cardiovascular hypertension, Pt. on home beta blockers and Pt. on medications + Peripheral Vascular Disease (Thoracic aortic aneurysm- 5cm), +CHF and + DOE  + Valvular Problems/Murmurs AI  Rhythm:Regular Rate:Normal + Diastolic murmurs Echo 35/32/99: Study Conclusions  - Left ventricle: The cavity size was normal. There was moderate basal hypertrophy of the septum with otherwise mild concentric hypertrophy. Systolic function was normal. The estimated ejection fraction was in the range of 50% to 55%. Wall motion was normal; there were no regional wall motion abnormalities. Doppler parameters are consistent with abnormal left ventricular relaxation (grade 1 diastolic dysfunction). - Aortic valve: Transvalvular velocity was within the normal range. There was no stenosis. There was moderate regurgitation. - Aorta: Ascending aortic diameter: 50 mm (S). 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 - Mitral valve: Transvalvular velocity was within the normal range. There was no evidence for stenosis. There was trivial regurgitation. - Left atrium: The atrium was moderately dilated. - Right ventricle: The cavity size was normal. Wall thickness was normal. Systolic function was normal. - Pulmonary arteries: Systolic pressure was within the normal range. PA peak pressure: 24 mm Hg (S).  Impressions:  - Compared with the echo 05/2017, the ascending aorta aneurysm has increased from 4.7  cm to 5.0 cm.   Neuro/Psych negative neurological ROS     GI/Hepatic negative GI ROS, Neg liver ROS,   Endo/Other  diabetes, Well Controlled, Type 2Obesity   Renal/GU negative Renal ROS   BENIGN PROSTATIC HYPERPLASIA    Musculoskeletal  (+) Arthritis ,   Abdominal   Peds  Hematology negative hematology ROS (+)   Anesthesia Other Findings Day of surgery medications reviewed with the patient.  Reproductive/Obstetrics                           Anesthesia Physical Anesthesia Plan  ASA: III  Anesthesia Plan: General   Post-op Pain Management:    Induction: Intravenous  PONV Risk Score and Plan: 3 and Ondansetron, Dexamethasone and Treatment may vary due to age or medical condition  Airway Management Planned: LMA  Additional Equipment:   Intra-op Plan:   Post-operative Plan: Extubation in OR  Informed Consent: I have reviewed the patients History and Physical, chart, labs and discussed the procedure including the risks, benefits and alternatives for the proposed anesthesia with the patient or authorized representative who has indicated his/her understanding and acceptance.   Dental advisory given  Plan Discussed with: CRNA  Anesthesia Plan Comments:         Anesthesia Quick Evaluation

## 2018-04-18 NOTE — Anesthesia Procedure Notes (Signed)
Procedure Name: LMA Insertion Date/Time: 04/18/2018 2:39 PM Performed by: Catalina Gravel, MD Pre-anesthesia Checklist: Patient identified, Emergency Drugs available, Suction available and Patient being monitored Patient Re-evaluated:Patient Re-evaluated prior to induction Oxygen Delivery Method: Circle system utilized Preoxygenation: Pre-oxygenation with 100% oxygen Induction Type: IV induction Ventilation: Mask ventilation without difficulty LMA: LMA inserted LMA Size: 5.0 Number of attempts: 1 Airway Equipment and Method: Bite block Placement Confirmation: positive ETCO2 Tube secured with: Tape Dental Injury: Teeth and Oropharynx as per pre-operative assessment

## 2018-04-19 ENCOUNTER — Encounter (HOSPITAL_BASED_OUTPATIENT_CLINIC_OR_DEPARTMENT_OTHER): Payer: Self-pay | Admitting: Urology

## 2018-04-19 NOTE — Anesthesia Postprocedure Evaluation (Signed)
Anesthesia Post Note  Patient: Clinton Black  Procedure(s) Performed: CYSTOSCOPY WITH INSERTION OF UROLIFT (N/A Bladder)     Patient location during evaluation: PACU Anesthesia Type: General Level of consciousness: awake and alert, oriented and awake Pain management: pain level controlled Vital Signs Assessment: post-procedure vital signs reviewed and stable Respiratory status: spontaneous breathing, nonlabored ventilation and respiratory function stable Cardiovascular status: blood pressure returned to baseline and stable Postop Assessment: no apparent nausea or vomiting Anesthetic complications: no    Last Vitals:  Vitals:   04/18/18 1600 04/18/18 1630  BP: 140/62 (!) 180/59  Pulse: (!) 44 (!) 48  Resp: 12 16  Temp:  (!) 36.4 C  SpO2: 96% 100%    Last Pain:  Vitals:   04/18/18 1630  TempSrc:   PainSc: 1                  Catalina Gravel

## 2018-04-19 NOTE — Progress Notes (Signed)
Pt called and states his catheter bag is leaking and he has had to place it in a ziploc bag. Also, states pain medication is not relieving his pain and he a lot of pain when bladder is "emptying". Instructed pt to call office and see if they want to see him today vs. Tomorrow morning when he is scheduled for a postop appointment

## 2018-04-20 DIAGNOSIS — N401 Enlarged prostate with lower urinary tract symptoms: Secondary | ICD-10-CM | POA: Diagnosis not present

## 2018-04-20 DIAGNOSIS — R351 Nocturia: Secondary | ICD-10-CM | POA: Diagnosis not present

## 2018-05-04 DIAGNOSIS — N401 Enlarged prostate with lower urinary tract symptoms: Secondary | ICD-10-CM | POA: Diagnosis not present

## 2018-05-04 DIAGNOSIS — R351 Nocturia: Secondary | ICD-10-CM | POA: Diagnosis not present

## 2018-05-19 ENCOUNTER — Encounter: Payer: Self-pay | Admitting: Cardiothoracic Surgery

## 2018-05-19 ENCOUNTER — Ambulatory Visit
Admission: RE | Admit: 2018-05-19 | Discharge: 2018-05-19 | Disposition: A | Discharge status: Home / Self Care | Payer: Medicare HMO | Source: Ambulatory Visit | Attending: Cardiothoracic Surgery | Admitting: Cardiothoracic Surgery

## 2018-05-19 ENCOUNTER — Other Ambulatory Visit: Payer: Self-pay

## 2018-05-19 ENCOUNTER — Ambulatory Visit: Payer: Medicare HMO | Admitting: Cardiothoracic Surgery

## 2018-05-19 VITALS — BP 116/58 | HR 61 | Resp 18 | Ht 77.0 in | Wt 280.0 lb

## 2018-05-19 DIAGNOSIS — I712 Thoracic aortic aneurysm, without rupture, unspecified: Secondary | ICD-10-CM

## 2018-05-19 DIAGNOSIS — I351 Nonrheumatic aortic (valve) insufficiency: Secondary | ICD-10-CM

## 2018-05-19 DIAGNOSIS — I517 Cardiomegaly: Secondary | ICD-10-CM | POA: Diagnosis not present

## 2018-05-19 IMAGING — CT CT ANGIO CHEST
2 of 6 series · 13 of 36 positions shown · IV contrast (iopamidol)
Comparison: CT scan of [DATE].

CLINICAL DATA: Thoracic aortic aneurysm without rupture.

EXAM:
CT ANGIOGRAPHY CHEST WITH CONTRAST
TECHNIQUE: Multidetector CT imaging of the chest was performed using the
standard protocol during bolus administration of intravenous
contrast. Multiplanar CT image reconstructions and MIPs were
obtained to evaluate the vascular anatomy.
CONTRAST:  75mL [WE] IOPAMIDOL ([WE]) INJECTION 76%

[Series 5: cta thorax 2.00 bv36 s3 ax axial arterial · axial · arterial · 0.69mm/px · z∈[+1637,+1927]mm · 12 of 173 slices shown]
[im 14/173  lung]
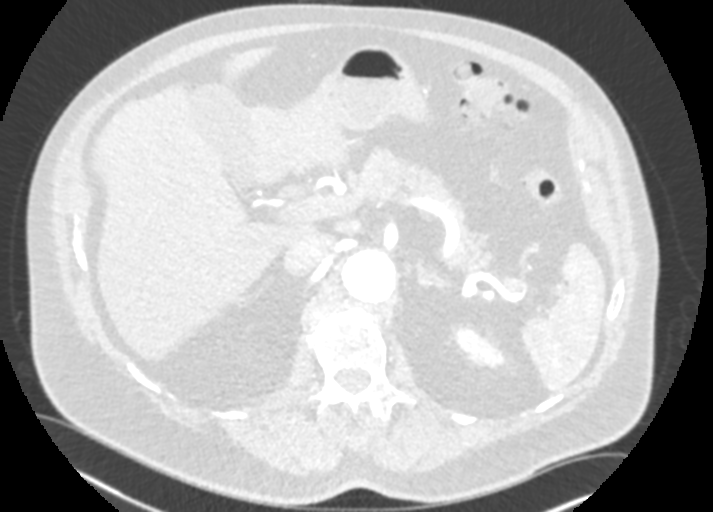
[im 27/173  mediastinal]
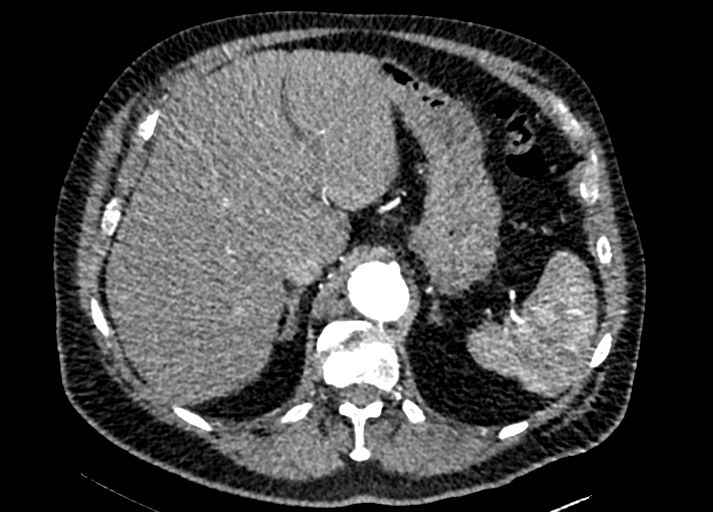
[im 40/173  lung]
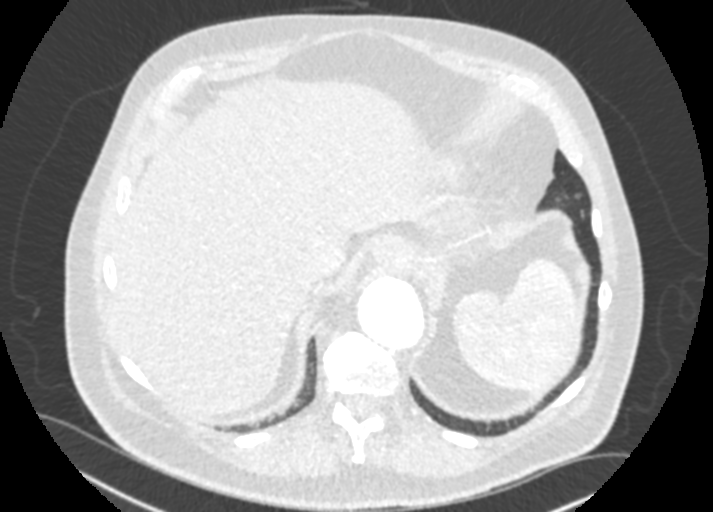
[im 53/173  mediastinal]
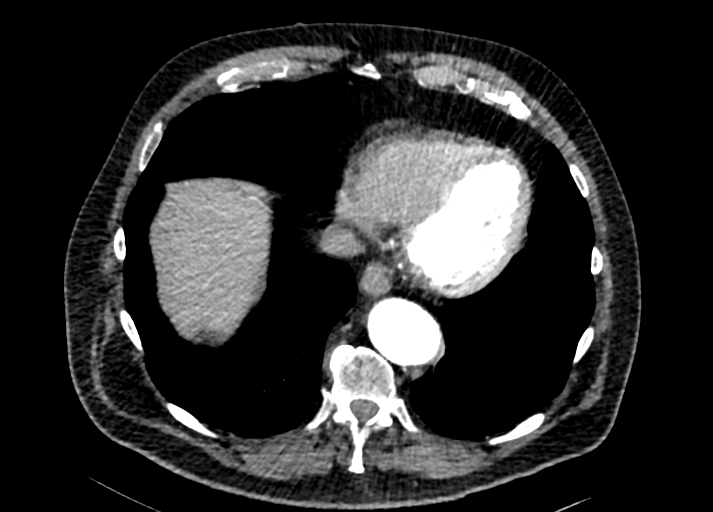
[im 67/173  lung]
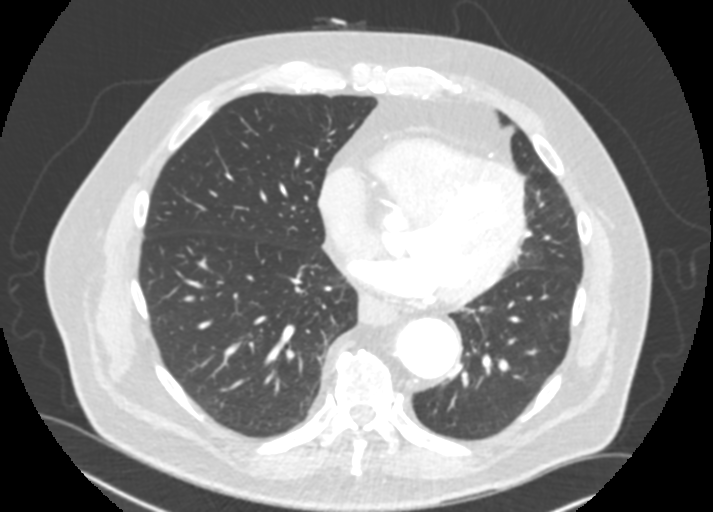
[im 80/173  mediastinal]
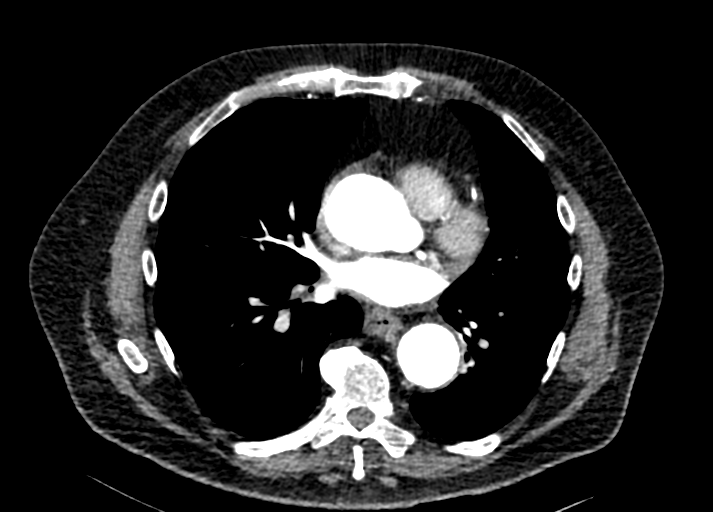
[im 93/173  lung]
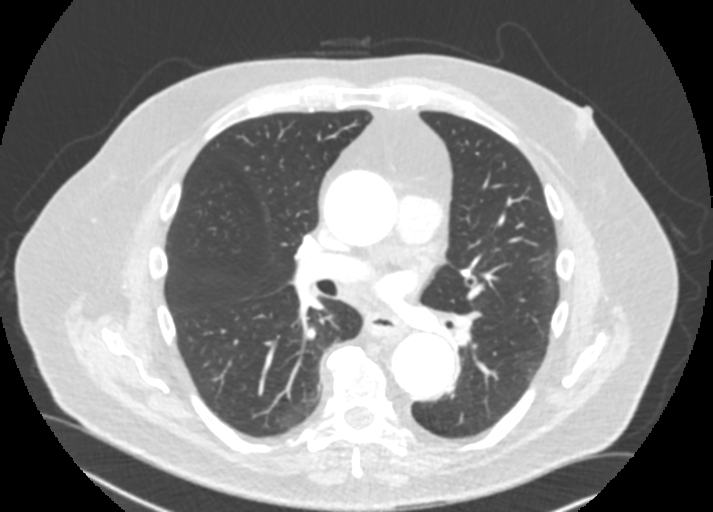
[im 106/173  mediastinal]
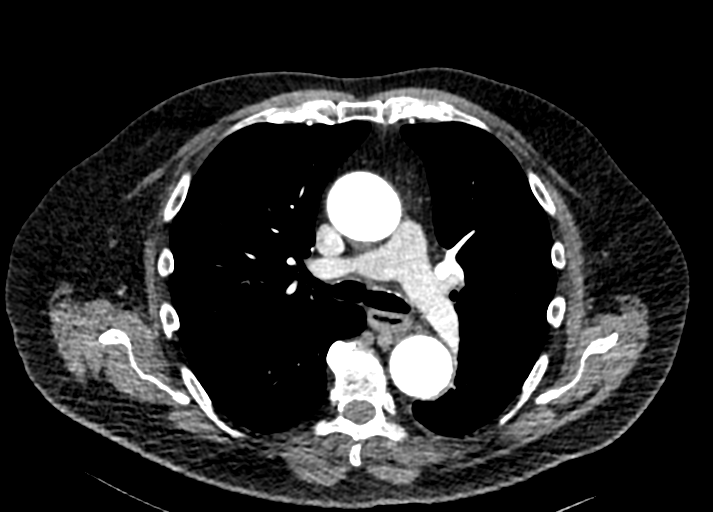
[im 120/173  lung]
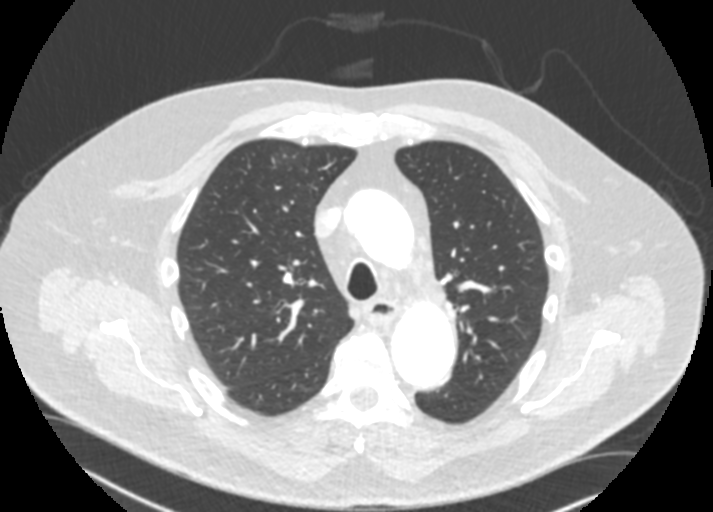
[im 133/173  mediastinal]
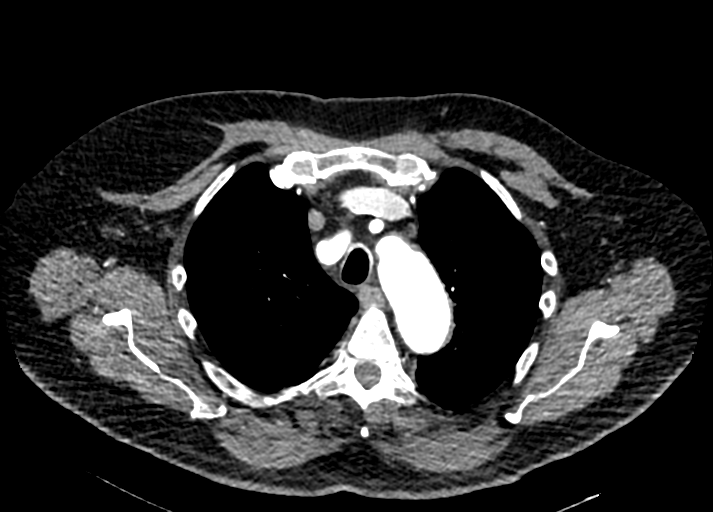
[im 146/173  lung]
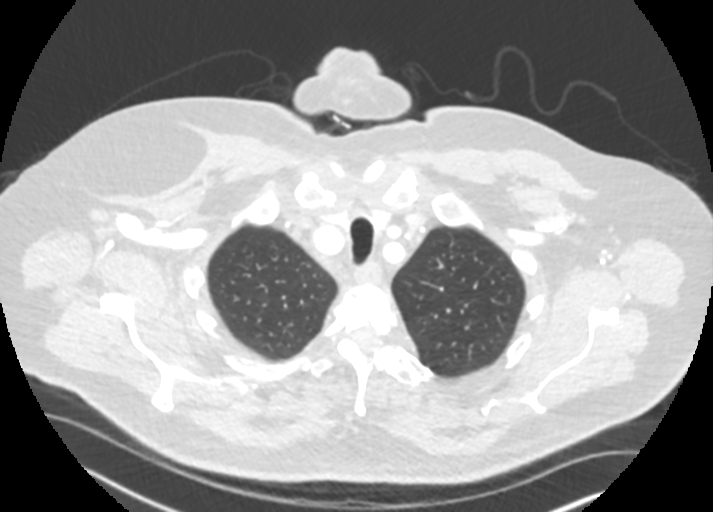
[im 159/173  mediastinal]
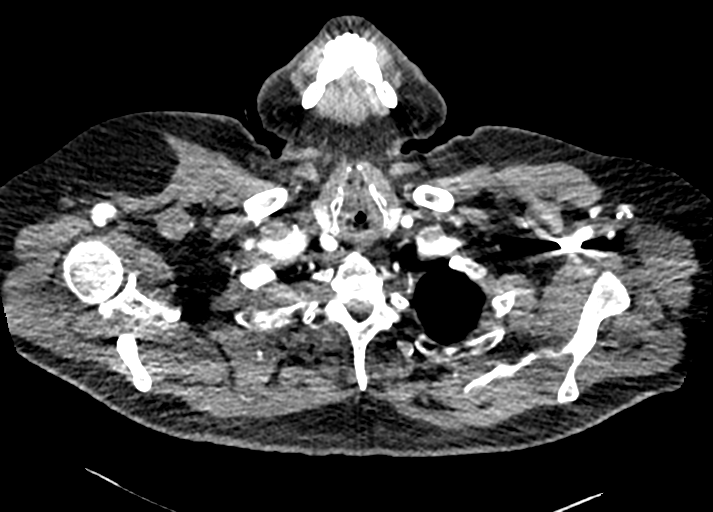

[Series 10: cta thorax 2.00 bv36 s3 cor cor st · coronal · 0.68mm/px · 1 of 176 slices shown]
[im 88/176  mediastinal]
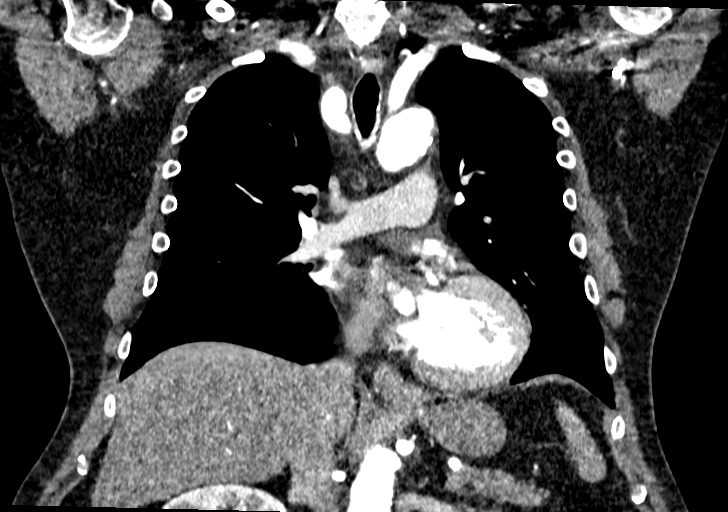

[13 of 36 positions shown; findings below may reference images not displayed]

FINDINGS: Cardiovascular: 5.4 cm ascending thoracic aortic aneurysm is noted
which is not significantly changed compared to prior exam based on
my own measurement using sagittal reconstructions. Transverse aortic
arch measures 3.3 cm. Proximal descending thoracic aorta measures
4.2 cm. No dissection is noted. Atherosclerosis of thoracic aorta is
noted. Great vessels are widely patent without significant stenosis.
Normal cardiac size. No pericardial effusion is noted.

Mediastinum/Nodes: No enlarged mediastinal, hilar, or axillary lymph
nodes. Thyroid gland, trachea, and esophagus demonstrate no
significant findings.

Lungs/Pleura: Lungs are clear. No pleural effusion or pneumothorax.

Upper Abdomen: No acute abnormality.

Musculoskeletal: No chest wall abnormality. No acute or significant
osseous findings.

Review of the MIP images confirms the above findings.
IMPRESSION: 5.4 cm ascending thoracic aortic aneurysm is noted which is not
significantly changed based on my own measurements of the prior
exam. Recommend semi-annual imaging followup by CTA or MRA and
referral to cardiothoracic surgery if not already obtained. This
recommendation follows [WE]
ACCF/AHA/AATS/ACR/ASA/SCA/LU/LU/LU/LU Guidelines for the
Diagnosis and Management of Patients With Thoracic Aortic Disease.
Circulation. [WE]; 121: [id number].

Aortic Atherosclerosis ([WE]-[WE]).

## 2018-05-19 MED ORDER — IOPAMIDOL (ISOVUE-370) INJECTION 76%
75.0000 mL | Freq: Once | INTRAVENOUS | Status: AC | PRN
  Administered 2018-05-19: 75 mL via INTRAVENOUS

## 2018-05-19 NOTE — Progress Notes (Signed)
[address]       [place] [address]             [phone]                    Ghazi P [facility] Medical Record #[id number] Date of Birth: May 31, 1946  Referring: [name], [name], MD Primary Care: [name], MD  Chief Complaint:    Chief Complaint  Patient presents with  . Aortic Insuffiency    REGURG...6 month f/u with ECHO/CTA CHEST 03/29/18    History of Present Illness:    RAKAN SOFFER 72 y.o. male followed in office for ascending aortic dilatation   he returns to the office today with a follow-up CTA of the chest and recent echocardiogram.  The patient notes since his last seen he has retired, notes that he has been more diligent about his health care and diet.  Notes that overall he feels better than he did 6 months ago.  Denies chest pain or shortness of breath   The patient originally presented to y his primary care doctor complaining of urinary frequency at night nocturia and shortness of breath.  He is referred to cardiology echocardiogram nuclear stress test and CT a of the chest were done.     Patient has no family history of aortic dissection he does note that his father died at age 42 suddenly with a cerebral embolus mother died at age 85 he is had one sister died at age 52 of breast cancer and another sister has had breast cancer in atrial fib still alive patient has no children no family history of aortic dissections or aneurysm.  Current Activity/ Functional Status:  Patient is independent with mobility/ambulation, transfers, ADL's, IADL's.   Zubrod Score: At the time of surgery this patient's most appropriate activity status/level should be described as: []     0    Normal activity, no symptoms [x]     1    Restricted in physical strenuous activity but ambulatory, able to do out light work []     2    Ambulatory and capable of self care, unable to do work activities, up and about               >50 % of waking hours                               []     3    Only limited self care, in bed greater than 50% of waking hours []     4    Completely disabled, no self care, confined to bed or chair []     5    Moribund   Past Medical History:  Diagnosis Date  . Arthritis   . BPH (benign prostatic hyperplasia)   . Diabetes mellitus type 2 in obese (HCC)    diet controlled  . HTN (hypertension)   . Hyperlipidemia, mixed   . Insomnia   . Obesity   . Thoracic aortic aneurysm, without rupture (HCC)    4.7 cm per chest ct with contrast 11-04-17    Past Surgical History:  Procedure Laterality Date  . COLONOSCOPY  2016  . CYST EXCISION     polynomial  . CYSTOSCOPY WITH INSERTION OF UROLIFT N/A 04/18/2018   Procedure: CYSTOSCOPY WITH INSERTION OF UROLIFT;  Surgeon: Cleon Gustin, MD;  Location: [facility];  Service: Urology;  Laterality: N/A;  . KNEE SURGERY Right 2000   arthroscopy  . TONSILLECTOMY    . widson teeth extraction      Family History  Problem Relation Age of Onset  . Aneurysm Father   . Heart disease Father   . Varicose Veins Mother   . Arthritis Mother   . Macular degeneration Mother   . Cancer Neg Hx   . Colon cancer Neg Hx   . Esophageal cancer Neg Hx   . Rectal cancer Neg Hx   . Stomach cancer Neg Hx     Social History   Socioeconomic History  . Marital status: Single    Spouse name: Not on file  . Number of children: Not on file  . Years of education: Not on file  . Highest education level: Not on file  Occupational History  . Not on file  Social Needs  . Financial resource strain: Not on file  . Food insecurity:    Worry: Not on file    Inability: Not on file  . Transportation needs:    Medical: Not on file    Non-medical: Not on file  Tobacco Use  . Smoking status: Former Smoker    Packs/day: 1.50    Years: 5.00    Pack years: 7.50    Types: Cigarettes    Last attempt to quit: 06/15/1994    Years since quitting: 23.9  . Smokeless tobacco: Never  Used  Substance and Sexual Activity  . Alcohol use: Yes    Alcohol/week: 14.0 standard drinks    Types: 14 Shots of liquor per week    Comment: 1 drink of liquor per day  . Drug use: Yes    Types: Marijuana    Comment: occasionally marijuana  . Sexual activity: Yes    Partners: Female    Birth control/protection: Condom    Comment: condom use most of the time but not always  Lifestyle  . Physical activity:    Days per week: Not on file    Minutes per session: Not on file  . Stress: Not on file  Relationships  . Social connections:    Talks on phone: Not on file    Gets together: Not on file    Attends religious service: Not on file    Active member of club or organization: Not on file    Attends meetings of clubs or organizations: Not on file    Relationship status: Not on file  . Intimate partner violence:    Fear of current or ex partner: Not on file    Emotionally abused: Not on file    Physically abused: Not on file    Forced sexual activity: Not on file  Other Topics Concern  . Not on file  [place] - [employer] and [employer]. married '72- 3 years/divorced; married '89- 3 years/divorced;. married '03 - 3 years/divorced. 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    Social History   Tobacco Use  Smoking Status Former Smoker  . Packs/day: 1.50  . Years: 5.00  . Pack years: 7.50  . Types: Cigarettes  . Last attempt to quit: 06/15/1994  . Years since quitting: 23.9  Smokeless Tobacco Never Used    Social History   Substance and Sexual Activity  Alcohol Use Yes  . Alcohol/week: 14.0 standard drinks  . Types: 14 Shots of liquor per week   Comment: 1 drink of liquor per day  No Known Allergies  Current Outpatient Medications  Medication Sig Dispense Refill  . aspirin 81 MG tablet Take 1 tablet (81 mg total) by mouth daily. (Patient taking differently: Take 81 mg by mouth at bedtime. ) 90 tablet 3  . atorvastatin (LIPITOR) 20 MG tablet Take  1 tablet (20 mg total) by mouth daily. (Patient taking differently: Take 20 mg by mouth at bedtime. ) 90 tablet 1  . cefUROXime (CEFTIN) 500 MG tablet Take 500 mg by mouth 2 (two) times daily with a meal. Take 30 minutes prior to procedure per dr Alyson Ingles    . Coenzyme Q10 100 MG capsule     . finasteride (PROSCAR) 5 MG tablet Take 5 mg by mouth at bedtime.     Marland Kitchen losartan (COZAAR) 50 MG tablet TAKE 1 TABLET BY MOUTH EVERY DAY (Patient taking differently: at bedtime. ) 90 tablet 1  . metoprolol succinate (TOPROL-XL) 25 MG 24 hr tablet TAKE 1 TABLET BY MOUTH EVERY DAY (Patient taking differently: at bedtime. ) 90 tablet 2  . spironolactone (ALDACTONE) 25 MG tablet Take 0.5 tablets (12.5 mg total) by mouth daily. (Patient taking differently: Take 12.5 mg by mouth at bedtime. ) 45 tablet 3  . traMADol (ULTRAM) 50 MG tablet Take 1 tablet (50 mg total) by mouth every 6 (six) hours as needed for moderate pain. 30 tablet 0  . triazolam (HALCION) 0.25 MG tablet TAKE 1 TABLET BY MOUTH AT BEDTIME AS NEEDED 30 tablet 2  . TURMERIC PO Take by mouth. 2 tabs per day     No current facility-administered medications for this visit.     Pertinent items are noted in HPI.   Review of Systems:  Review of Systems  Constitutional: Negative.   HENT: Negative.   Eyes: Negative.   Respiratory: Negative.   Cardiovascular: Positive for leg swelling. Negative for chest pain, palpitations, orthopnea, claudication and PND.  Gastrointestinal: Negative.   Genitourinary: Negative.   Musculoskeletal: Positive for back pain and joint pain. Negative for falls, myalgias and neck pain.  Skin: Negative.   Neurological: Negative.   Endo/Heme/Allergies: Negative.   Psychiatric/Behavioral: Negative.       Physical Exam: BP (!) 116/58 (BP Location: Right Arm, Patient Position: Sitting, Cuff Size: Large)   Pulse 61   Resp 18   Ht 6\' 5"  (1.956 m)   Wt 280 lb (127 kg)   SpO2 97% Comment: ON RA  BMI 33.20 kg/m   PHYSICAL  EXAMINATION: General appearance: alert, cooperative, appears stated age and no distress Head: Normocephalic, without obvious abnormality, atraumatic Neck: no adenopathy, no carotid bruit, no JVD, supple, symmetrical, trachea midline and thyroid not enlarged, symmetric, no tenderness/mass/nodules Lymph nodes: Cervical, supraclavicular, and axillary nodes normal. Resp: clear to auscultation bilaterally Back: symmetric, no curvature. ROM normal. No CVA tenderness. Cardio: regular rate and rhythm and diastolic murmur: late diastolic 3/6, decrescendo at lower left sternal border, at apex GI: soft, non-tender; bowel sounds normal; no masses,  no organomegaly Extremities: extremities normal, atraumatic, no cyanosis or edema Neurologic: Grossly normal      Recent Radiology Findings:  Ct Angio Chest Aorta W/cm &/or Wo/cm  Result Date: 05/19/2018 CLINICAL DATA:  Thoracic aortic aneurysm without rupture. EXAM: CT ANGIOGRAPHY CHEST WITH CONTRAST TECHNIQUE: Multidetector CT imaging of the chest was performed using the standard protocol during bolus administration of intravenous contrast. Multiplanar CT image reconstructions and MIPs were obtained to evaluate the vascular anatomy. CONTRAST:  44mL ISOVUE-370 IOPAMIDOL (ISOVUE-370) INJECTION 76% COMPARISON:  CT scan  of Nov 04, 2017. FINDINGS: Cardiovascular: 5.4 cm ascending thoracic aortic aneurysm is noted which is not significantly changed compared to prior exam based on my own measurement using sagittal reconstructions. Transverse aortic arch measures 3.3 cm. Proximal descending thoracic aorta measures 4.2 cm. No dissection is noted. Atherosclerosis of thoracic aorta is noted. Great vessels are widely patent without significant stenosis. Normal cardiac size. No pericardial effusion is noted. Mediastinum/Nodes: No enlarged mediastinal, hilar, or axillary lymph nodes. Thyroid gland, trachea, and esophagus demonstrate no significant findings. Lungs/Pleura: Lungs  are clear. No pleural effusion or pneumothorax. Upper Abdomen: No acute abnormality. Musculoskeletal: No chest wall abnormality. No acute or significant osseous findings. Review of the MIP images confirms the above findings. IMPRESSION: 5.4 cm ascending thoracic aortic aneurysm is noted which is not significantly changed based on my own measurements of the prior exam. Recommend semi-annual imaging followup by CTA or MRA and referral to cardiothoracic surgery if not already obtained. This recommendation follows 2010 ACCF/AHA/AATS/ACR/ASA/SCA/SCAI/SIR/STS/SVM Guidelines for the Diagnosis and Management of Patients With Thoracic Aortic Disease. Circulation. 2010; 121: S962-E366. Aortic Atherosclerosis (ICD10-I70.0). Electronically Signed   By: Marijo Conception, M.D.   On: 05/19/2018 12:26  I have independently reviewed the above radiology studies  and reviewed the findings with the patient.   Ct Angio Chest Aorta W/cm &/or Wo/cm  Result Date: 11/04/2017 CLINICAL DATA:  Thoracic aortic aneurysm EXAM: CT ANGIOGRAPHY CHEST WITH CONTRAST TECHNIQUE: Multidetector CT imaging of the chest was performed using the standard protocol during bolus administration of intravenous contrast. Multiplanar CT image reconstructions and MIPs were obtained to evaluate the vascular anatomy. CONTRAST:  55mL ISOVUE-370 IOPAMIDOL (ISOVUE-370) INJECTION 76% COMPARISON:  02/26/2017 FINDINGS: Cardiovascular: Maximal diameters of the ascending aorta at the sinus of Valsalva, sino-tubular junction, and ascending aorta are 3.9 cm, 3.8 cm, and 5.2 cm respectively. Based on my direct measurements on the prior study, this is not significantly changed. There is no evidence of aortic dissection. There is no obvious intramural hematoma. Maximal diameter of the proximal descending thoracic aorta is 4.3 cm. The aortic arch is nonaneurysmal. Atherosclerotic calcifications in the aortic arch. Minimal atherosclerotic calcifications in the descending  thoracic aorta. Great vessels are patent. Right subclavian artery is ectatic with a maximal diameter of 2.0 cm. Mild LAD territory coronary artery calcifications. No obvious acute pulmonary thromboembolism. Mediastinum/Nodes: No abnormal adenopathy. No pericardial effusion. Visualized thyroid is atrophic. Mild diffuse wall thickening of the esophagus. Lungs/Pleura: No pneumothorax. No pleural effusion. Pleural base nodule in the lateral right lower lobe on image 92 of series 6 measures 7 mm. This is stable. Upper Abdomen: No acute abnormality. Musculoskeletal: No vertebral compression deformity. Review of the MIP images confirms the above findings. IMPRESSION: Aneurysmal dilatation of the ascending aorta is 5.2 cm. Based on my direct measurements of the prior study, this has not significantly changed. There is no evidence of dissection. Mild aneurysmal dilatation of the descending thoracic aorta at 4.3 cm is also not significantly changed. Ascending thoracic aortic aneurysm. Recommend semi-annual imaging followup by CTA or MRA and referral to cardiothoracic surgery if not already obtained. This recommendation follows 2010 ACCF/AHA/AATS/ACR/ASA/SCA/SCAI/SIR/STS/SVM Guidelines for the Diagnosis and Management of Patients With Thoracic Aortic Disease. Circulation. 2010; 121: Q947-M546 Aortic Atherosclerosis (ICD10-I70.0). Electronically Signed   By: Marybelle Killings M.D.   On: 11/04/2017 09:05  I have independently reviewed the above radiology studies  and reviewed the findings with the patient. I have personally measured the older CT and the current CT. I agree with the  radiologist that there is does not appear to be any significant change, however the maximum diameter 4.7 centimeters acending  3.8 to 3.9 cm at the sinus of Valsalva    CLINICAL DATA:  Aortic root dilatation.  EXAM: CT ANGIOGRAPHY CHEST WITH CONTRAST  TECHNIQUE: Multidetector CT imaging of the chest was performed using the standard protocol  during bolus administration of intravenous contrast. Multiplanar CT image reconstructions and MIPs were obtained to evaluate the vascular anatomy.  CONTRAST:  100 mL of Isovue 370 intravenously.  COMPARISON:  None.  FINDINGS: Cardiovascular: Aortic root measures 3.9 cm in diameter. 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 Aneurysmal dilatation of descending thoracic aorta is noted with maximum measured diameter 4.1 cm. Aortic atherosclerosis is noted without dissection. Great vessels are widely patent without stenosis. No pericardial effusion is noted.  Mediastinum/Nodes: No enlarged mediastinal, hilar, or axillary lymph nodes. Thyroid gland, trachea, and esophagus demonstrate no significant findings.  Lungs/Pleura: Lungs are clear. No pleural effusion or pneumothorax.  Upper Abdomen: Probable fatty infiltration of the liver is noted.  Musculoskeletal: 8.6 x 5.3 cm fat containing lesion is seen within the right pectoralis muscle most consistent with intramuscular lipoma.  Review of the MIP images confirms the above findings.  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 Recommend semi-annual imaging followup by CTA or MRA and referral to cardiothoracic surgery if not already obtained. This recommendation follows 2010 ACCF/AHA/AATS/ACR/ASA/SCA/SCAI/SIR/STS/SVM Guidelines for the Diagnosis and Management of Patients With Thoracic Aortic Disease. Circulation. 2010; 121: F621-H086.  Probable fatty infiltration of the liver.  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  Aortic Atherosclerosis (ICD10-I70.0).   Electronically Signed   By: Marijo Conception, M.D.   On: 02/26/2017 11:57  I have independently reviewed the above radiologic studies.  Recent Lab Findings: Lab Results  Component Value Date   WBC 7.6 12/24/2016   HGB 14.3 04/18/2018   HCT 42.0  04/18/2018   PLT 171.0 12/24/2016   GLUCOSE 113 (H) 04/18/2018   CHOL 105 03/11/2017   TRIG 115 03/11/2017   HDL 36 (L) 03/11/2017   LDLDIRECT 120.0 05/11/2016   LDLCALC 46 03/11/2017   ALT 31 03/11/2017   AST 19 03/11/2017   NA 140 04/18/2018   K 4.5 04/18/2018   CL 105 07/06/2017   CREATININE 0.76 07/06/2017   BUN 17 07/06/2017   CO2 21 07/06/2017   TSH 2.72 12/24/2016   HGBA1C 6.8 07/15/2017   ECHO: Echocardiography  Patient:    Orien, Mayhall MR #:       [id number] Study Date: 03/29/2018 Gender:     M Age:        22 Height:     195.6 cm Weight:     127 kg BSA:        2.66 m^2 Pt. Status: Room:   ATTENDING    Lanelle Bal MD  Lefors MD  REFERRING    Lanelle Bal MD  SONOGRAPHER  Cindy Hazy, RDCS  PERFORMING   [facility], Outpatient  cc:  ------------------------------------------------------------------- LV EF: 50% -   55%  ------------------------------------------------------------------- Indications:      I35.1 Aortic Insufficiency.  ------------------------------------------------------------------- History:   PMH:  Acquired from the patient and from the patient&'s chart.  PMH:  Thoracic Aortic Aneurysm.  Risk factors: Hypertension. Diabetes mellitus. Obese. Dyslipidemia.  ------------------------------------------------------------------- Study Conclusions  - Left ventricle: The cavity size was normal. There was  moderate   basal hypertrophy of the septum with otherwise mild concentric   hypertrophy. Systolic function was normal. The estimated ejection   fraction was in the range of 50% to 55%. Wall motion was normal;   there were no regional wall motion abnormalities. Doppler   parameters are consistent with abnormal left ventricular   relaxation (grade 1 diastolic dysfunction). - Aortic valve: Transvalvular velocity was within the normal range.   There was no stenosis. There was moderate regurgitation. -  Aorta: Ascending aortic diameter: 50 mm (S). 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 - Mitral valve: Transvalvular velocity was within the normal range.   There was no evidence for stenosis. There was trivial   regurgitation. - Left atrium: The atrium was moderately dilated. - Right ventricle: The cavity size was normal. Wall thickness was   normal. Systolic function was normal. - Pulmonary arteries: Systolic pressure was within the normal   range. PA peak pressure: 24 mm Hg (S).  Impressions:  - Compared with the echo 05/2017, the ascending aorta aneurysm has   increased from 4.7 cm to 5.0 cm.  ------------------------------------------------------------------- Study data:   Study status:  Routine.  Procedure:  The patient reported no pain pre or post test. Transthoracic echocardiography for left ventricular function evaluation, for right ventricular function evaluation, and for assessment of valvular function. Image quality was adequate.  Study completion:  There were no complications.          Echocardiography.  M-mode, complete 2D, spectral Doppler, and color Doppler.  Birthdate:  Patient birthdate: [birth year].  Age:  Patient is 72 yr old.  Sex:  Gender: male.    BMI: 33.2 kg/m^2.  Blood pressure:     118/74  Patient status:  Outpatient.  Study date:  Study date: 03/29/2018. Study time: 04:07 PM.  Location:  [facility]  -------------------------------------------------------------------  ------------------------------------------------------------------- Left ventricle:  The cavity size was normal. There was moderate basal hypertrophy of the septum with otherwise mild concentric hypertrophy. Systolic function was normal. The estimated ejection fraction was in the range of 50% to 55%. Wall motion was normal; there were no regional wall motion abnormalities. Doppler parameters are consistent with abnormal left ventricular relaxation (grade 1  diastolic dysfunction).  ------------------------------------------------------------------- Aortic valve:   Trileaflet; normal thickness leaflets. Mobility was not restricted.  Doppler:  Transvalvular velocity was within the normal range. There was no stenosis. There was moderate regurgitation.    VTI ratio of LVOT to aortic valve: 0.64. Valve area (VTI): 2.66 cm^2. Indexed valve area (VTI): 1 cm^2/m^2. Peak velocity ratio of LVOT to aortic valve: 0.66. Valve area (Vmax): 2.74 cm^2. Indexed valve area (Vmax): 1.03 cm^2/m^2. Mean velocity ratio of LVOT to aortic valve: 0.71. Valve area (Vmean): 2.93 cm^2. Indexed valve area (Vmean): 1.1 cm^2/m^2.    Mean gradient (S): 8 mm Hg. Peak gradient (S): 15 mm Hg.  ------------------------------------------------------------------- Aorta:  Aortic root: The aortic root was normal in size. Ascending aorta: The ascending aorta was severely dilated.  ------------------------------------------------------------------- Mitral valve:   Structurally normal valve.   Mobility was not restricted.  Doppler:  Transvalvular velocity was within the normal range. There was no evidence for stenosis. There was trivial regurgitation.  ------------------------------------------------------------------- Left atrium:  The atrium was moderately dilated.  ------------------------------------------------------------------- Right ventricle:  The cavity size was normal. Wall thickness was normal. Systolic function was normal.  ------------------------------------------------------------------- Pulmonic valve:    Structurally normal valve.   Cusp separation was normal.  Doppler:  Transvalvular velocity  was within the normal range. There was no evidence for stenosis. There was no regurgitation.  ------------------------------------------------------------------- Tricuspid valve:   Structurally normal valve.    Doppler: Transvalvular velocity was within the  normal range. There was trivial regurgitation.  ------------------------------------------------------------------- Pulmonary artery:   The main pulmonary artery was normal-sized. Systolic pressure was within the normal range.  ------------------------------------------------------------------- Right atrium:  The atrium was normal in size.  ------------------------------------------------------------------- Pericardium:  There was no pericardial effusion.  ------------------------------------------------------------------- Systemic veins: Inferior vena cava: The vessel was dilated. The respirophasic diameter changes were in the normal range (>= 50%), consistent with elevated central venous pressure.  ------------------------------------------------------------------- Measurements   Left ventricle                           Value          Reference  LV ID, ED, PLAX chordal          (H)     59    mm       43 - 52  LV ID, ES, PLAX chordal          (H)     45    mm       23 - 38  LV fx shortening, PLAX chordal   (L)     24    %        >=29  LV PW thickness, ED                      11    mm       ----------  IVS/LV PW ratio, ED              (H)     1.36           <=1.3  Stroke volume, 2D                        134   ml       ----------  Stroke volume/bsa, 2D                    50    ml/m^2   ----------  LV e&', lateral                           7.29  cm/s     ----------  LV E/e&', lateral                         7.28           ----------  LV e&', medial                            6.31  cm/s     ----------  LV E/e&', medial                          8.42           ----------  LV e&', average                           6.8   cm/s     ----------  LV E/e&', average  7.81           ----------    Ventricular septum                       Value          Reference  IVS thickness, ED                        15    mm       ----------    LVOT                                      Value          Reference  LVOT ID, S                               23    mm       ----------  LVOT area                                4.15  cm^2     ----------  LVOT ID                                  23    mm       ----------  LVOT peak velocity, S                    126   cm/s     ----------  LVOT mean velocity, S                    95.4  cm/s     ----------  LVOT VTI, S                              32.2  cm       ----------  LVOT peak gradient, S                    6     mm Hg    ----------  Stroke volume (SV), LVOT DP              133.8 ml       ----------  Stroke index (SV/bsa), LVOT DP           50.3  ml/m^2   ----------    Aortic valve                             Value          Reference  Aortic valve peak velocity, S            191   cm/s     ----------  Aortic valve mean velocity, S            135   cm/s     ----------  Aortic valve VTI, S                      50.3  cm       ----------  Aortic mean gradient, S                  8     mm Hg    ----------  Aortic peak gradient, S                  15    mm Hg    ----------  VTI ratio, LVOT/AV                       0.64           ----------  Aortic valve area, VTI                   2.66  cm^2     ----------  Aortic valve area/bsa, VTI               1     cm^2/m^2 ----------  Velocity ratio, peak, LVOT/AV            0.66           ----------  Aortic valve area, peak velocity         2.74  cm^2     ----------  Aortic valve area/bsa, peak              1.03  cm^2/m^2 ----------  velocity  Velocity ratio, mean, LVOT/AV            0.71           ----------  Aortic valve area, mean velocity         2.93  cm^2     ----------  Aortic valve area/bsa, mean              1.1   cm^2/m^2 ----------  velocity  Aortic regurg pressure half-time         736   ms       ----------    Aorta                                    Value          Reference  Aortic root ID, ED                       46    mm       ----------  Ascending aorta ID, A-P,  S               50    mm       ----------    Left atrium                              Value          Reference  LA ID, A-P, ES                           43    mm       ----------  LA ID/bsa, A-P                           1.62  cm/m^2   <=2.2  LA volume, ES, 1-p A4C  42.1  ml       ----------  LA volume/bsa, ES, 1-p A4C               15.8  ml/m^2   ----------  LA volume, ES, 1-p A2C                   86.3  ml       ----------  LA volume/bsa, ES, 1-p A2C               32.5  ml/m^2   ----------    Mitral valve                             Value          Reference  Mitral E-wave peak velocity              53.1  cm/s     ----------  Mitral A-wave peak velocity              85.7  cm/s     ----------  Mitral deceleration time         (H)     271   ms       150 - 230  Mitral E/A ratio, peak                   0.6            ----------    Pulmonary arteries                       Value          Reference  PA pressure, S, DP                       24    mm Hg    <=30    Tricuspid valve                          Value          Reference  Tricuspid regurg peak velocity           203   cm/s     ----------  Tricuspid peak RV-RA gradient            16    mm Hg    ----------    Right atrium                             Value          Reference  RA ID, S-I, ES, A4C                      44.5  mm       34 - 49  RA area, ES, A4C                         13.7  cm^2     8.3 - 19.5  RA volume, ES, A/L                       35    ml       ----------  RA volume/bsa, ES, A/L  13.2  ml/m^2   ----------    Systemic veins                           Value          Reference  Estimated CVP                            8     mm Hg    ----------    Right ventricle                          Value          Reference  RV ID, minor axis, ED, A4C base          41    mm       ----------  TAPSE                                    25.3  mm       ----------  RV pressure, S, DP                       24     mm Hg    <=30  RV s&', lateral, S                        11.6  cm/s     ----------  Legend: (L)  and  (H)  mark values outside specified reference range.  ------------------------------------------------------------------- Prepared and Electronically Authenticated by  Skeet Latch, MD 2019-10-15T19:02:53  Study Highlights    The left ventricular ejection fraction is moderately decreased (30-44%).  Nuclear stress EF: 39%.  There was no ST segment deviation noted during stress.  No T wave inversion was noted during stress.  This is an intermediate risk study due to reduced systolic function.  There is no ischemia.    Nuclear History and Indications   History and Indications Indication for Stress Test: Diagnosis of coronary disease History: Hx Acute Bronchitis; No prior cardiac history reported; No prior NUC MPI for comparison. Cardiac Risk Factors: Family History - CAD, History of Smoking, Hypertension, Lipids and Obesity  Symptoms: Dizziness, DOE, Fatigue and SOB  Stress Findings   ECG Baseline ECG exhibits normal sinus rhythm..  Stress Findings A pharmacological stress test was performed using IV Lexiscan 0.4mg  over 10 seconds performed without concurrent submaximal exercise.  The patient reported shortness of breath during the stress test.   Test was stopped per protocol.  Response to Stress There was no ST segment deviation noted during stress.  No T wave inversion was noted during stress. Arrhythmias during stress: frequent PVCs.  Arrhythmias during recovery: none.  Arrhythmias were not significant.  ECG was interpretable and there was no significant change from baseline.  Stress Measurements   Baseline Vitals  Rest HR 63 bpm    Rest BP 141/67 mmHg    Peak Stress Vitals  Peak HR 74 bpm    Peak BP 136/69 mmHg       Nuclear Stress Measurements   LV sys vol 143 mL    TID 0.98     LV dias vol 234 mL    SSS 0     SRS 0  SDS 0            Nuclear Stress Findings   Isotope administration Rest isotope was administered with an IV injection of 31.3 mCi technetium tetrofosmin. Rest SPECT images were obtained approximately 45 minutes post tracer injection. Stress isotope was administered with an IV injection of 29.6 mCi technetium tetrofosmin 20 seconds post IV Lexiscan administration. Stress SPECT images were obtained approximately 60 minutes post tracer injection.  Nuclear Study Quality Overall image quality is good.  Nuclear Measurements Study was gated.  Rest Perfusion Rest perfusion normal.  Stress Perfusion Stress perfusion normal.  Overall Study Impression Myocardial perfusion is normal. This is an intermediate risk study. Overall left ventricular systolic function was abnormal. LV cavity size is moderately enlarged. Nuclear stress EF: 39%. The left ventricular ejection fraction is moderately decreased (30-44%). There is no prior study for comparison.  From: ACCF/SCAI/STS/AATS/AHA/ASNC/HFSA/SCCT 2012 Appropriate Use Criteria for Coronary Revascularization Focused Update  Wall Scoring   Score Index: 2.000 Percent Normal: 0.0%          The left ventricular wall motion is globally hypokinetic.          Signed   Electronically signed by Skeet Latch, MD on 02/16/17 at [facility] [facility]T  Report approved and finalized on 02/16/2017 1918   Aortic Size Index=  5.0      /Body surface area is 2.63 meters squared. 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  He will contact cardiology office.   Plan to see the patient back in 6 months with a follow-up CTA of the chest, will leave to cardiology to obtain a follow-up echo cardiogram at the appropriate time.  The patient's symptoms of heart failure seem to improve from his last visit his dyspnea on exertion is significant improved, he notes less pedal edema,  He was cautioned against strenuous lifting and Valsalva maneuver He was again cautioned about use of Cipro He was encouraged to have regular dental checkups  He notes currently he is the sole caregiver for his significant other who is become progressively incapacitated and is really no longer ambulatory    [name] MD      [address] [place] [address] Office [phone]   Beeper [phone]  05/19/2018 12:51 PM

## 2018-05-19 NOTE — Patient Instructions (Signed)

## 2018-05-23 ENCOUNTER — Other Ambulatory Visit: Payer: Self-pay | Admitting: Internal Medicine

## 2018-05-23 DIAGNOSIS — G4730 Sleep apnea, unspecified: Principal | ICD-10-CM

## 2018-05-23 DIAGNOSIS — G4700 Insomnia, unspecified: Secondary | ICD-10-CM

## 2018-05-25 ENCOUNTER — Other Ambulatory Visit: Payer: Self-pay | Admitting: Internal Medicine

## 2018-05-25 ENCOUNTER — Telehealth: Payer: Self-pay

## 2018-05-25 DIAGNOSIS — G4730 Sleep apnea, unspecified: Principal | ICD-10-CM

## 2018-05-25 DIAGNOSIS — G4700 Insomnia, unspecified: Secondary | ICD-10-CM

## 2018-05-25 MED ORDER — TRIAZOLAM 0.25 MG PO TABS: 0.2500 mg | ORAL_TABLET | Freq: Every evening | ORAL | 0 refills | Status: DC | PRN

## 2018-05-25 NOTE — Telephone Encounter (Signed)
lvm informing pt that rx has been sent to [facility] as requested.

## 2018-05-25 NOTE — Telephone Encounter (Signed)
Pt has an appointment scheduled for 06/01/18. Pt is rq enough triazolam to get to that appointment.

## 2018-05-25 NOTE — Telephone Encounter (Signed)
Copied from [place] [phone]. Topic: General - Other >> May 24, 2018  3:55 PM Janace Aris A wrote: Reason for CRM: Pt called in, says he needed to schedule a follow up appt with his provider in order to receive a RX refill for his medication triazolam (HALCION) 0.25 MG tablet. Pt did schedule an appt, he would like a refill called in for the time being to hold him over until his appointment. >> May 24, 2018  4:22 PM Para Skeans A wrote: This should of been added to the refill request.  >> May 24, 2018  4:31 PM Cairrikier Dian Queen, CMA wrote: When does his refill run out and can we schedule him sooner? >> May 25, 2018  8:25 AM Para Skeans A wrote: LVM to see how many parent had left. &If we could maybe move his appointment up sooner than January.  >> May 25, 2018  8:59 AM Morphies, Isidoro Donning wrote: Patient states he is completely out of this medication. He took the last one yesterday. Appointment has been moved up to next Wednesday (06/01/2018) at 1:30pm.  Can this prescription be sent in for him to get him through until his appointment?

## 2018-05-26 ENCOUNTER — Ambulatory Visit: Payer: Medicare HMO | Admitting: Cardiothoracic Surgery

## 2018-05-30 ENCOUNTER — Other Ambulatory Visit: Payer: Self-pay | Admitting: Physician Assistant

## 2018-06-01 ENCOUNTER — Other Ambulatory Visit (INDEPENDENT_AMBULATORY_CARE_PROVIDER_SITE_OTHER): Payer: Medicare HMO

## 2018-06-01 ENCOUNTER — Encounter: Payer: Self-pay | Admitting: Internal Medicine

## 2018-06-01 ENCOUNTER — Ambulatory Visit (INDEPENDENT_AMBULATORY_CARE_PROVIDER_SITE_OTHER): Payer: Medicare HMO | Admitting: Internal Medicine

## 2018-06-01 VITALS — BP 130/60 | HR 83 | Temp 98.3°F | Ht 77.0 in | Wt 281.0 lb

## 2018-06-01 DIAGNOSIS — E785 Hyperlipidemia, unspecified: Secondary | ICD-10-CM

## 2018-06-01 DIAGNOSIS — I10 Essential (primary) hypertension: Secondary | ICD-10-CM | POA: Diagnosis not present

## 2018-06-01 DIAGNOSIS — E118 Type 2 diabetes mellitus with unspecified complications: Secondary | ICD-10-CM

## 2018-06-01 DIAGNOSIS — I4819 Other persistent atrial fibrillation: Secondary | ICD-10-CM | POA: Insufficient documentation

## 2018-06-01 DIAGNOSIS — G4730 Sleep apnea, unspecified: Secondary | ICD-10-CM

## 2018-06-01 DIAGNOSIS — G4700 Insomnia, unspecified: Secondary | ICD-10-CM

## 2018-06-01 DIAGNOSIS — I4891 Unspecified atrial fibrillation: Secondary | ICD-10-CM

## 2018-06-01 LAB — CBC WITH DIFFERENTIAL/PLATELET
Basophils Absolute: 0.1 10*3/uL (ref 0.0–0.1)
Basophils Relative: 1.2 % (ref 0.0–3.0)
Eosinophils Absolute: 0.5 10*3/uL (ref 0.0–0.7)
Eosinophils Relative: 7 % — ABNORMAL HIGH (ref 0.0–5.0)
HCT: 43.6 % (ref 39.0–52.0)
Hemoglobin: 14.8 g/dL (ref 13.0–17.0)
Lymphocytes Relative: 27.1 % (ref 12.0–46.0)
Lymphs Abs: 2 10*3/uL (ref 0.7–4.0)
MCHC: 33.8 g/dL (ref 30.0–36.0)
MCV: 88.7 fl (ref 78.0–100.0)
Monocytes Absolute: 0.5 10*3/uL (ref 0.1–1.0)
Monocytes Relative: 6.3 % (ref 3.0–12.0)
Neutro Abs: 4.3 10*3/uL (ref 1.4–7.7)
Neutrophils Relative %: 58.4 % (ref 43.0–77.0)
Platelets: 196 10*3/uL (ref 150.0–400.0)
RBC: 4.92 Mil/uL (ref 4.22–5.81)
RDW: 13 % (ref 11.5–15.5)
WBC: 7.3 10*3/uL (ref 4.0–10.5)

## 2018-06-01 LAB — URINALYSIS, ROUTINE W REFLEX MICROSCOPIC
Bilirubin Urine: NEGATIVE
Hgb urine dipstick: NEGATIVE
Ketones, ur: NEGATIVE
Leukocytes, UA: NEGATIVE
Nitrite: NEGATIVE
RBC / HPF: NONE SEEN (ref 0–?)
Specific Gravity, Urine: >=1.03 — AB (ref 1.000–1.030)
Total Protein, Urine: NEGATIVE
Urine Glucose: NEGATIVE
Urobilinogen, UA: 1 (ref 0.0–1.0)
pH: 5.5 (ref 5.0–8.0)

## 2018-06-01 LAB — COMPREHENSIVE METABOLIC PANEL
ALT: 21 U/L (ref 0–53)
AST: 15 U/L (ref 0–37)
Albumin: 4.6 g/dL (ref 3.5–5.2)
Alkaline Phosphatase: 91 U/L (ref 39–117)
BUN: 19 mg/dL (ref 6–23)
CO2: 26 mEq/L (ref 19–32)
Calcium: 9.5 mg/dL (ref 8.4–10.5)
Chloride: 106 mEq/L (ref 96–112)
Creatinine, Ser: 0.99 mg/dL (ref 0.40–1.50)
GFR: 79.21 mL/min (ref >60.00)
Glucose, Bld: 125 mg/dL — ABNORMAL HIGH (ref 70–99)
Potassium: 4.5 mEq/L (ref 3.5–5.1)
Sodium: 140 mEq/L (ref 135–145)
Total Bilirubin: 0.9 mg/dL (ref 0.2–1.2)
Total Protein: 7.1 g/dL (ref 6.0–8.3)

## 2018-06-01 LAB — MICROALBUMIN / CREATININE URINE RATIO
Creatinine,U: 203.1 mg/dL
Microalb Creat Ratio: 2.3 mg/g (ref 0.0–30.0)
Microalb, Ur: 4.6 mg/dL — ABNORMAL HIGH (ref 0.0–1.9)

## 2018-06-01 LAB — HEMOGLOBIN A1C: Hgb A1c MFr Bld: 6.4 % (ref 4.6–6.5)

## 2018-06-01 LAB — LIPID PANEL
Cholesterol: 110 mg/dL (ref 0–200)
HDL: 34.3 mg/dL — ABNORMAL LOW (ref >39.00)
LDL Cholesterol: 48 mg/dL (ref 0–99)
NonHDL: 75.51
Total CHOL/HDL Ratio: 3
Triglycerides: 137 mg/dL (ref 0.0–149.0)
VLDL: 27.4 mg/dL (ref 0.0–40.0)

## 2018-06-01 LAB — TSH: TSH: 4.12 u[IU]/mL (ref 0.35–4.50)

## 2018-06-01 MED ORDER — TRIAZOLAM 0.25 MG PO TABS: 0.2500 mg | ORAL_TABLET | Freq: Every evening | ORAL | 3 refills | Status: DC | PRN

## 2018-06-01 MED ORDER — RIVAROXABAN 20 MG PO TABS: 20.0000 mg | ORAL_TABLET | Freq: Every day | ORAL | 0 refills | Status: DC

## 2018-06-01 NOTE — Patient Instructions (Signed)
Atrial Fibrillation Atrial fibrillation is a type of irregular or rapid heartbeat (arrhythmia). In atrial fibrillation, the top part of the heart (atria) quivers in a chaotic pattern. This makes the heart unable to pump blood normally. Having atrial fibrillation can increase your risk for other health problems, such as:  Blood can pool in the atria and form clots. If a clot travels to the brain, it can cause a stroke.  The heart muscle may weaken from the irregular blood flow. This can cause heart failure. Atrial fibrillation may start suddenly and stop on its own, or it may become a long-lasting problem. What are the causes? This condition is caused by some heart-related conditions or procedures, including:  High blood pressure. This is the most common cause.  Heart failure.  Heart valve conditions.  Inflammation of the sac that surrounds the heart (pericarditis).  Heart surgery.  Coronary artery disease.  Certain heart rhythm disorders, such as Wolf-Parkinson-White syndrome. Other causes include:  Pneumonia.  Obstructive sleep apnea.  Lung cancer.  Thyroid problems, especially if the thyroid is overactive (hyperthyroidism).  Excessive alcohol or drug use. Sometimes, the cause of this condition is not known. What increases the risk? This condition is more likely to develop in:  Older people.  People who smoke.  People who have diabetes mellitus.  People who are overweight (obese).  Athletes who exercise vigorously.  People who have a family history. What are the signs or symptoms? Symptoms of this condition include:  A feeling that your heart is beating rapidly or irregularly.  A feeling of discomfort or pain in your chest.  Shortness of breath.  Sudden light-headedness or weakness.  Getting tired easily during exercise. In some cases, there are no symptoms. How is this diagnosed? Your health care provider may be able to detect atrial fibrillation when  taking your pulse. If detected, this condition may be diagnosed with:  Electrocardiogram (ECG).  Ambulatory cardiac monitor. This device records your heartbeats for 24 hours or more.  Transthoracic echocardiogram (TTE) to evaluate how blood flows through your heart.  Transesophageal echocardiogram (TEE) to view more detailed images of your heart.  A stress test.  Imaging tests, such as a CT scan or chest X-ray.  Blood tests. How is this treated? This condition may be treated with:  Medicines to slow down the heart rate or bring the heart's rhythm back to normal.  Medicines to prevent blood clots from forming.  Electrical cardioversion. This delivers a low-energy shock to the heart to reset its rhythm.  Ablation. This procedure destroys the part of the heart tissue that sends abnormal signals.  Left atrial appendage occlusion/excision. This seals off a common place in the atria where blood clots can form (left atrial appendage). The goal of treatment is to prevent blood clots from forming and to keep your heart beating at a normal rate and rhythm. Treatment depends on underlying medical conditions and how you feel when you are experiencing fibrillation. Follow these instructions at home: Medicines  Take over-the counter and prescription medicines only as told by your health care provider.  If your health care provider prescribed a blood-thinning medicine (anticoagulant), take it exactly as told. Taking too much blood-thinning medicine can cause bleeding. Taking too little can enable a blood clot to form and travel to the brain, causing a stroke. Lifestyle      Do not use any products that contain nicotine or tobacco, such as cigarettes and e-cigarettes. If you need help quitting, ask your health   care provider.  Do not drink beverages that contain caffeine, such as coffee, soda, and tea.  Follow diet instructions as told by your health care provider.  Exercise regularly as  told by your health care provider.  Do not drink alcohol. General instructions  If you have obstructive sleep apnea, manage your condition as told by your health care provider.  Maintain a healthy weight. Do not use diet pills unless your health care provider approves. Diet pills may make heart problems worse.  Keep all follow-up visits as told by your health care provider. This is important. Contact a health care provider if you:  Notice a change in the rate, rhythm, or strength of your heartbeat.  Are taking an anticoagulant and you notice increased bruising.  Tire more easily when you exercise or exert yourself.  Have a sudden change in weight. Get help right away if you have:   Chest pain, abdominal pain, sweating, or weakness.  Difficulty breathing.  Blood in your vomit, stool (feces), or urine.  Any symptoms of a stroke. "BE FAST" is an easy way to remember the main warning signs of a stroke: ? B - Balance. Signs are dizziness, sudden trouble walking, or loss of balance. ? E - Eyes. Signs are trouble seeing or a sudden change in vision. ? F - Face. Signs are sudden weakness or numbness of the face, or the face or eyelid drooping on one side. ? A - Arms. Signs are weakness or numbness in an arm. This happens suddenly and usually on one side of the body. ? S - Speech. Signs are sudden trouble speaking, slurred speech, or trouble understanding what people say. ? T - Time. Time to call emergency services. Write down what time symptoms started.  Other signs of a stroke, such as: ? A sudden, severe headache with no known cause. ? Nausea or vomiting. ? Seizure. These symptoms may represent a serious problem that is an emergency. Do not wait to see if the symptoms will go away. Get medical help right away. Call your local emergency services (911 in the [place] Do not drive yourself to the hospital. Summary  Atrial fibrillation is a type of irregular or rapid heartbeat  (arrhythmia).  Symptoms include a feeling that your heart is beating fast or irregularly. In some cases, you may not have symptoms.  The condition is treated with medicines to slow down the heart rate or bring the heart's rhythm back to normal. You may also need blood-thinning medicines to prevent blood clots.  Get help right away if you have symptoms or signs of a stroke. This information is not intended to replace advice given to you by your health care provider. Make sure you discuss any questions you have with your health care provider. Document Released: 06/01/2005 Document Revised: 07/23/2017 Document Reviewed: 07/23/2017 [employer] Interactive Patient Education  2019 [employer]  

## 2018-06-01 NOTE — Progress Notes (Signed)
Subjective:  Patient ID: Clinton Black, male    DOB: 15-Jul-1945  Age: 72 y.o. MRN: [id number]  CC: Hypertension; Diabetes; Atrial Fibrillation; and Hyperlipidemia   HPI Clinton Black presents for f/up - He offers no new complaints today.  He continues to struggle with insomnia and requests a refill on triazolam.  He is not very active but denies palpitations, DOE, CP, edema, or fatigue.  Outpatient Medications Prior to Visit  Medication Sig Dispense Refill  . atorvastatin (LIPITOR) 20 MG tablet Take 1 tablet (20 mg total) by mouth daily. (Patient taking differently: Take 20 mg by mouth at bedtime. ) 90 tablet 1  . finasteride (PROSCAR) 5 MG tablet Take 5 mg by mouth at bedtime.     Marland Kitchen losartan (COZAAR) 50 MG tablet TAKE 1 TABLET BY MOUTH EVERY DAY (Patient taking differently: at bedtime. ) 90 tablet 1  . metoprolol succinate (TOPROL-XL) 25 MG 24 hr tablet TAKE 1 TABLET BY MOUTH EVERY DAY (Patient taking differently: at bedtime. ) 90 tablet 2  . spironolactone (ALDACTONE) 25 MG tablet Take 0.5 tablets (12.5 mg total) by mouth daily. (Patient taking differently: Take 12.5 mg by mouth at bedtime. ) 45 tablet 3  . TURMERIC PO Take by mouth. 2 tabs per day    . aspirin 81 MG tablet Take 1 tablet (81 mg total) by mouth daily. (Patient taking differently: Take 81 mg by mouth at bedtime. ) 90 tablet 3  . cefUROXime (CEFTIN) 500 MG tablet Take 500 mg by mouth 2 (two) times daily with a meal. Take 30 minutes prior to procedure per dr Alyson Ingles    . Coenzyme Q10 100 MG capsule     . traMADol (ULTRAM) 50 MG tablet Take 1 tablet (50 mg total) by mouth every 6 (six) hours as needed for moderate pain. 30 tablet 0  . triazolam (HALCION) 0.25 MG tablet Take 1 tablet (0.25 mg total) by mouth at bedtime as needed. 30 tablet 0   No facility-administered medications prior to visit.     ROS Review of Systems  Constitutional: Negative for diaphoresis, fatigue and unexpected weight change.  HENT: Negative.    Eyes: Negative for visual disturbance.  Respiratory: Negative for cough, chest tightness, shortness of breath and wheezing.   Cardiovascular: Negative for chest pain, palpitations and leg swelling.  Gastrointestinal: Negative for abdominal pain, constipation, diarrhea, nausea and vomiting.  Genitourinary: Negative.  Negative for difficulty urinating.  Musculoskeletal: Negative.  Negative for arthralgias and myalgias.  Skin: Negative.  Negative for color change and pallor.  Neurological: Negative.  Negative for dizziness, weakness and light-headedness.  Hematological: Negative for adenopathy. Does not bruise/bleed easily.  Psychiatric/Behavioral: Positive for sleep disturbance. Negative for decreased concentration and dysphoric mood. The patient is not nervous/anxious.     Objective:  BP 130/60 (BP Location: Left Arm, Patient Position: Sitting, Cuff Size: Large)   Pulse 83   Temp 98.3 F (36.8 C) (Oral)   Ht 6\' 5"  (1.956 m)   Wt 281 lb (127.5 kg)   SpO2 97%   BMI 33.32 kg/m   BP Readings from Last 3 Encounters:  06/01/18 130/60  05/19/18 (!) 116/58  04/18/18 (!) 180/59    Wt Readings from Last 3 Encounters:  06/01/18 281 lb (127.5 kg)  05/19/18 280 lb (127 kg)  04/18/18 282 lb 8 oz (128.1 kg)    Physical Exam Vitals signs reviewed.  Constitutional:      Appearance: He is obese. He is not ill-appearing.  HENT:  Nose: Nose normal.     Mouth/Throat:     Mouth: Mucous membranes are moist.     Pharynx: No posterior oropharyngeal erythema.  Eyes:     Conjunctiva/sclera: Conjunctivae normal.  Neck:     Musculoskeletal: Normal range of motion and neck supple.  Cardiovascular:     Rate and Rhythm: Normal rate. Rhythm irregularly irregular. Occasional extrasystoles are present.    Chest Wall: PMI is not displaced.     Heart sounds: No murmur. No systolic murmur. No diastolic murmur. No gallop.      Comments: EKG ----  Atrial fibrillation  - frequent ectopic  ventricular beat s  # VECs = 2 ABNORMAL RHYTHM- the A fib is new for him Pulmonary:     Effort: Pulmonary effort is normal.     Breath sounds: Normal breath sounds. No stridor. No wheezing, rhonchi or rales.  Abdominal:     General: Bowel sounds are normal.     Palpations: Abdomen is soft. There is no hepatomegaly, splenomegaly or mass.     Tenderness: There is no abdominal tenderness.     Hernia: No hernia is present.  Musculoskeletal: Normal range of motion.        General: No swelling or tenderness.     Right lower leg: No edema.     Left lower leg: No edema.  Skin:    General: Skin is warm and dry.  Neurological:     General: No focal deficit present.     Mental Status: He is oriented to person, place, and time. Mental status is at baseline.     Lab Results  Component Value Date   WBC 7.3 06/01/2018   HGB 14.8 06/01/2018   HCT 43.6 06/01/2018   PLT 196.0 06/01/2018   GLUCOSE 125 (H) 06/01/2018   CHOL 110 06/01/2018   TRIG 137.0 06/01/2018   HDL 34.30 (L) 06/01/2018   LDLDIRECT 120.0 05/11/2016   LDLCALC 48 06/01/2018   ALT 21 06/01/2018   AST 15 06/01/2018   NA 140 06/01/2018   K 4.5 06/01/2018   CL 106 06/01/2018   CREATININE 0.99 06/01/2018   BUN 19 06/01/2018   CO2 26 06/01/2018   TSH 4.12 06/01/2018   PSA 0.94 11/01/2017   HGBA1C 6.4 06/01/2018   MICROALBUR 4.6 (H) 06/01/2018    Ct Angio Chest Aorta W/cm &/or Wo/cm  Result Date: 05/19/2018 CLINICAL DATA:  Thoracic aortic aneurysm without rupture. EXAM: CT ANGIOGRAPHY CHEST WITH CONTRAST TECHNIQUE: Multidetector CT imaging of the chest was performed using the standard protocol during bolus administration of intravenous contrast. Multiplanar CT image reconstructions and MIPs were obtained to evaluate the vascular anatomy. CONTRAST:  44mL ISOVUE-370 IOPAMIDOL (ISOVUE-370) INJECTION 76% COMPARISON:  CT scan of Nov 04, 2017. FINDINGS: Cardiovascular: 5.4 cm ascending thoracic aortic aneurysm is noted which is not  significantly changed compared to prior exam based on my own measurement using sagittal reconstructions. Transverse aortic arch measures 3.3 cm. Proximal descending thoracic aorta measures 4.2 cm. No dissection is noted. Atherosclerosis of thoracic aorta is noted. Great vessels are widely patent without significant stenosis. Normal cardiac size. No pericardial effusion is noted. Mediastinum/Nodes: No enlarged mediastinal, hilar, or axillary lymph nodes. Thyroid gland, trachea, and esophagus demonstrate no significant findings. Lungs/Pleura: Lungs are clear. No pleural effusion or pneumothorax. Upper Abdomen: No acute abnormality. Musculoskeletal: No chest wall abnormality. No acute or significant osseous findings. Review of the MIP images confirms the above findings. IMPRESSION: 5.4 cm ascending thoracic aortic aneurysm is noted  which is not significantly changed based on my own measurements of the prior exam. Recommend semi-annual imaging followup by CTA or MRA and referral to cardiothoracic surgery if not already obtained. This recommendation follows 2010 ACCF/AHA/AATS/ACR/ASA/SCA/SCAI/SIR/STS/SVM Guidelines for the Diagnosis and Management of Patients With Thoracic Aortic Disease. Circulation. 2010; 121: B353-G992. Aortic Atherosclerosis (ICD10-I70.0). Electronically Signed   By: Marijo Conception, M.D.   On: 05/19/2018 12:26    Assessment & Plan:   Jessy was seen today for hypertension, diabetes, atrial fibrillation and hyperlipidemia.  Diagnoses and all orders for this visit:  Type 2 diabetes mellitus with complication, without long-term current use of insulin ([place])- His A1c is at 6.4%.  His blood sugars are adequately well controlled. -     Comprehensive metabolic panel; Future -     Hemoglobin A1c; Future -     Microalbumin / creatinine urine ratio; Future  Essential hypertension- His blood pressure is well controlled.  Electrolytes and renal function are normal. -     CBC with  Differential/Platelet; Future -     Comprehensive metabolic panel; Future -     TSH; Future -     Urinalysis, Routine w reflex microscopic; Future -     EKG 12-Lead  Hyperlipidemia LDL goal <130- He has achieved his LDL goal and is doing well on the statin. -     Lipid panel; Future -     Comprehensive metabolic panel; Future -     TSH; Future  Obesity, morbid ([place]) - He is working on his lifestyle modifications to lose weight.  Atrial fibrillation, new onset ([place])- He has new onset A. fib.  His cha2ds2-vasc is 3.  Will start anticoagulation with Xarelto.  I have referred him to cardiology to see if he needs to be considered for cardioversion. -     Discontinue: rivaroxaban (XARELTO) 20 MG TABS tablet; Take 1 tablet (20 mg total) by mouth daily with supper. -     Ambulatory referral to Cardiology -     rivaroxaban (XARELTO) 20 MG TABS tablet; Take 1 tablet (20 mg total) by mouth daily with supper.  Insomnia w/ sleep apnea -     triazolam (HALCION) 0.25 MG tablet; Take 1 tablet (0.25 mg total) by mouth at bedtime as needed.   I have discontinued Cathrine Muster. Bhagat's aspirin, Coenzyme Q10, cefUROXime, and traMADol. I am also having him maintain his finasteride, spironolactone, TURMERIC PO, atorvastatin, metoprolol succinate, losartan, triazolam, and rivaroxaban.  Meds ordered this encounter  Medications  . DISCONTD: rivaroxaban (XARELTO) 20 MG TABS tablet    Sig: Take 1 tablet (20 mg total) by mouth daily with supper.    Dispense:  90 tablet    Refill:  0  . triazolam (HALCION) 0.25 MG tablet    Sig: Take 1 tablet (0.25 mg total) by mouth at bedtime as needed.    Dispense:  30 tablet    Refill:  3    Not to exceed 4 additional fills before 06/15/2018  . rivaroxaban (XARELTO) 20 MG TABS tablet    Sig: Take 1 tablet (20 mg total) by mouth daily with supper.    Dispense:  90 tablet    Refill:  0     Follow-up: Return in about 3 months (around 08/31/2018).  Scarlette Calico, MD

## 2018-06-13 ENCOUNTER — Ambulatory Visit: Payer: Medicare HMO | Admitting: Cardiology

## 2018-06-13 ENCOUNTER — Encounter: Payer: Self-pay | Admitting: Cardiology

## 2018-06-13 VITALS — BP 132/72 | HR 93 | Ht 77.0 in | Wt 284.8 lb

## 2018-06-13 DIAGNOSIS — I4819 Other persistent atrial fibrillation: Secondary | ICD-10-CM

## 2018-06-13 DIAGNOSIS — I5032 Chronic diastolic (congestive) heart failure: Secondary | ICD-10-CM

## 2018-06-13 DIAGNOSIS — I10 Essential (primary) hypertension: Secondary | ICD-10-CM | POA: Diagnosis not present

## 2018-06-13 DIAGNOSIS — I351 Nonrheumatic aortic (valve) insufficiency: Secondary | ICD-10-CM | POA: Diagnosis not present

## 2018-06-13 DIAGNOSIS — I712 Thoracic aortic aneurysm, without rupture, unspecified: Secondary | ICD-10-CM

## 2018-06-13 NOTE — Progress Notes (Signed)
Cardiology Office Note   Date:  06/13/2018   ID:  Clinton Black, Clinton Black 1945-08-22, MRN [id number]  PCP:  [name], MD  Cardiologist:   [name], MD   Chief Complaint  Patient presents with  . Follow-up      History of Present Illness: Clinton Black is a 72 y.o. male who is as a work in for new onset atrial fibrillation. He has a history of thoracic aortic aneurysm.  He has a history of DM type 2, morbid obesity, HLD, and HTN.   In 2018 he was experiencing dyspnea, often awakening him at night. He states that since he stopped smoking marijuana this has largely resolved.  He was seen by Dr. Ronnald Ramp and noted to have frequent ectopy. Ecg showed PVC couplets. He was hypertensive and started on Edarbi.  Denied any chest pain or syncope. No edema. Quit smoking in 1996. Has lost 25-30 lbs this past year. He does have a family history of CAD.   On  his initial visit he underwent evaluation with an Echocardiogram that showed "normal" LV function with moderate AI and dilated aortic root at 44 mm. On my review I felt his EF was mildly impaired at 45%. Myoview study showed a fixed inferobasal defect, no ischemia. EF 39%. He was started on Toprol XL and a CT of the aorta was ordered. 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   He was seen in December 2018 with increased PND. Started on aldactone with clinical improvement. Also on metoprolol and losartan. Repeat Echo done and was stable. Reported low normal EF and only "mild" AI.   In November he had cystoscopy with implantation of Urolift devices. This was without complication and no arrhythmia reported.  Seen by Dr. Servando Snare on Dec 6 and HR recorded at 30. When seen by Dr Ronnald Ramp found to be in Afib with rate 91. He was started on Xarelto.   He reports that his watch shows a HR in the 90s now. He notes some increased SOB getting out of the car or shower. Has to take "fast breaths"  at times. No chest pain, dizziness or syncope.     Past Medical History:  Diagnosis Date  . Arthritis   . BPH (benign prostatic hyperplasia)   . Diabetes mellitus type 2 in obese ([facility])    diet controlled  . HTN (hypertension)   . Hyperlipidemia, mixed   . Insomnia   . Obesity   . Thoracic aortic aneurysm, without rupture ([facility])    4.7 cm per chest ct with contrast 11-04-17    Past Surgical History:  Procedure Laterality Date  . COLONOSCOPY  2016  . CYST EXCISION     polynomial  . CYSTOSCOPY WITH INSERTION OF UROLIFT N/A 04/18/2018   Procedure: CYSTOSCOPY WITH INSERTION OF UROLIFT;  Surgeon: Cleon Gustin, MD;  Location: [facility];  Service: Urology;  Laterality: N/A;  . KNEE SURGERY Right 2000   arthroscopy  . TONSILLECTOMY    . widson teeth extraction       Current Outpatient Medications  Medication Sig Dispense Refill  . atorvastatin (LIPITOR) 20 MG tablet Take 1 tablet (20 mg total) by mouth daily. (Patient taking differently: Take 20 mg by mouth at bedtime. ) 90 tablet 1  . finasteride (PROSCAR) 5 MG tablet Take 5 mg by mouth at bedtime.     Marland Kitchen losartan (COZAAR) 50 MG  tablet TAKE 1 TABLET BY MOUTH EVERY DAY (Patient taking differently: at bedtime. ) 90 tablet 1  . metoprolol succinate (TOPROL-XL) 25 MG 24 hr tablet TAKE 1 TABLET BY MOUTH EVERY DAY (Patient taking differently: at bedtime. ) 90 tablet 2  . rivaroxaban (XARELTO) 20 MG TABS tablet Take 1 tablet (20 mg total) by mouth daily with supper. 90 tablet 0  . spironolactone (ALDACTONE) 25 MG tablet Take 0.5 tablets (12.5 mg total) by mouth at bedtime. 45 tablet 3  . triazolam (HALCION) 0.25 MG tablet Take 1 tablet (0.25 mg total) by mouth at bedtime as needed. 30 tablet 3  . TURMERIC PO Take by mouth. 2 tabs per day     No current facility-administered medications for this visit.     Allergies:   Quinolones    Social History:  The patient  reports that he quit smoking about 24 years ago. His  smoking use included cigarettes. He has a 7.50 pack-year smoking history. He has never used smokeless tobacco. He reports current alcohol use of about 14.0 standard drinks of alcohol per week. He reports current drug use. Drug: Marijuana.   Family History:  The patient's family history includes Aneurysm in his father; Arthritis in his mother; Heart disease in his father; Macular degeneration in his mother; Varicose Veins in his mother.    ROS:  Please see the history of present illness.   Otherwise, review of systems are positive for none.   All other systems are reviewed and negative.    PHYSICAL EXAM: VS:  BP 132/72   Pulse 93   Ht 6\' 5"  (1.956 m)   Wt 284 lb 12.8 oz (129.2 kg)   BMI 33.77 kg/m  , BMI Body mass index is 33.77 kg/m. GENERAL:  Well appearing, obese WM in NAD HEENT:  PERRL, EOMI, sclera are clear. Oropharynx is clear. NECK:  No jugular venous distention, carotid upstroke brisk and symmetric, no bruits, no thyromegaly or adenopathy LUNGS:  Clear to auscultation bilaterally CHEST:  Unremarkable HEART:  IRRR,  PMI not displaced or sustained,S1 and S2 within normal limits, no S3, no S4: no clicks, no rubs, no murmurs ABD:  Soft, nontender. BS +, no masses or bruits. No hepatomegaly, no splenomegaly EXT:  2 + pulses throughout, no edema, no cyanosis no clubbing SKIN:  Warm and dry.  No rashes NEURO:  Alert and oriented x 3. Cranial nerves II through XII intact. PSYCH:  Cognitively intact    EKG:  EKG is ordered today.Afib rate 93. occ PVC. T wave inversion inferiorly. I have personally reviewed and interpreted this study.   Recent Labs: 06/01/2018: ALT 21; BUN 19; Creatinine, Ser 0.99; Hemoglobin 14.8; Platelets 196.0; Potassium 4.5; Sodium 140; TSH 4.12    Lipid Panel    Component Value Date/Time   CHOL 110 06/01/2018 1328   CHOL 105 03/11/2017 1541   TRIG 137.0 06/01/2018 1328   HDL 34.30 (L) 06/01/2018 1328   HDL 36 (L) 03/11/2017 1541   CHOLHDL 3 06/01/2018  1328   VLDL 27.4 06/01/2018 1328   LDLCALC 48 06/01/2018 1328   LDLCALC 46 03/11/2017 1541   LDLDIRECT 120.0 05/11/2016 1433    dated 07/15/17: A1c 6.8%.  Wt Readings from Last 3 Encounters:  06/13/18 284 lb 12.8 oz (129.2 kg)  06/01/18 281 lb (127.5 kg)  05/19/18 280 lb (127 kg)      Other studies Reviewed: Additional studies/ records that were reviewed today include:    Myoview 02/12/17: Study Highlights  The left ventricular ejection fraction is moderately decreased (30-44%).  Nuclear stress EF: 39%.  There was no ST segment deviation noted during stress.  No T wave inversion was noted during stress.  This is an intermediate risk study due to reduced systolic function.  There is no ischemia.   CLINICAL DATA:  Thoracic aortic aneurysm without rupture.  EXAM: CT ANGIOGRAPHY CHEST WITH CONTRAST  TECHNIQUE: Multidetector CT imaging of the chest was performed using the standard protocol during bolus administration of intravenous contrast. Multiplanar CT image reconstructions and MIPs were obtained to evaluate the vascular anatomy.  CONTRAST:  43mL ISOVUE-370 IOPAMIDOL (ISOVUE-370) INJECTION 76%  COMPARISON:  CT scan of Nov 04, 2017.  FINDINGS: Cardiovascular: 5.4 cm ascending thoracic aortic aneurysm is noted which is not significantly changed compared to prior exam based on my own measurement using sagittal reconstructions. Transverse aortic arch measures 3.3 cm. Proximal descending thoracic aorta measures 4.2 cm. No dissection is noted. Atherosclerosis of thoracic aorta is noted. Great vessels are widely patent without significant stenosis. Normal cardiac size. No pericardial effusion is noted.  Mediastinum/Nodes: No enlarged mediastinal, hilar, or axillary lymph nodes. Thyroid gland, trachea, and esophagus demonstrate no significant findings.  Lungs/Pleura: Lungs are clear. No pleural effusion or pneumothorax.  Upper Abdomen: No acute  abnormality.  Musculoskeletal: No chest wall abnormality. No acute or significant osseous findings.  Review of the MIP images confirms the above findings.  IMPRESSION: 5.4 cm ascending thoracic aortic aneurysm is noted which is not significantly changed based on my own measurements of the prior exam. Recommend semi-annual imaging followup by CTA or MRA and referral to cardiothoracic surgery if not already obtained. This recommendation follows 2010 ACCF/AHA/AATS/ACR/ASA/SCA/SCAI/SIR/STS/SVM Guidelines for the Diagnosis and Management of Patients With Thoracic Aortic Disease. Circulation. 2010; 121: O671-I458.  Aortic Atherosclerosis (ICD10-I70.0).   Electronically Signed   By: Marijo Conception, M.D.   On: 05/19/2018 12:26  Echo 03/29/18: Study Conclusions  - Left ventricle: The cavity size was normal. There was moderate   basal hypertrophy of the septum with otherwise mild concentric   hypertrophy. Systolic function was normal. The estimated ejection   fraction was in the range of 50% to 55%. Wall motion was normal;   there were no regional wall motion abnormalities. Doppler   parameters are consistent with abnormal left ventricular   relaxation (grade 1 diastolic dysfunction). - Aortic valve: Transvalvular velocity was within the normal range.   There was no stenosis. There was moderate regurgitation. - Aorta: Ascending aortic diameter: 50 mm (S). 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 - Mitral valve: Transvalvular velocity was within the normal range.   There was no evidence for stenosis. There was trivial   regurgitation. - Left atrium: The atrium was moderately dilated. - Right ventricle: The cavity size was normal. Wall thickness was   normal. Systolic function was normal. - Pulmonary arteries: Systolic pressure was within the normal   range. PA peak pressure: 24 mm Hg (S).  Impressions:  - Compared with the echo 05/2017, the ascending  aorta aneurysm has   increased from 4.7 cm to 5.0 cm.  ASSESSMENT AND PLAN: 1. Thoracic aortic aneurysm. Increased from baseline of 4.6 to 5.0 cm. Followed by Dr. Servando Snare with CT surgery. Plan to repeat CT in 6 months. Continue aggressive BP control. Continue Toprol XL 25 mg daily and losartan 50 mg daily.   Recommend avoiding heavy lifting or staining. Appears that he is approaching need for elective aortic surgery. Will follow  CT closely. If aorta enlarges more will need a Shafer. 2. 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 Echo in October stable and EF had actually improved.  3. Chronic primarily diastolic CHF.continue metoprolol, losartan and aldactone.  4. Afib new onset. Appears to have started between Dec 6 and 18. He is mildly symptomatic. Rate is controlled and metoprolol and he is now on Xarelto for [place] vasc score of 5. Will plan DCCV once on anticoagulation for 4 weeks. I will see back in 3 weeks to discuss. Ultimately could consider a MAZE procedure if needs Aortic surgery in the future.  5. HTN. Controlled.  6. DM type 2 7. HLD- on lipitor. Excellent control 8. Abnormal myoview. No ischemia but reduced EF. Following for now with no anginal symptoms. Will eventually need cardiac cath for evaluation depending on progression of AI/anuerysm.    Current medicines are reviewed at length with the patient today.  The patient does not have concerns regarding medicines.  The following changes have been made:  See above.  Labs/ tests ordered today include:   No orders of the defined types were placed in this encounter.    Disposition:   FU with me 3 weeks for consideration of DCCV. Discussed importance of no gaps in anticoagulation therapy.  Signed, [name], MD ,[facility] 06/13/2018 2:06 PM    [place] [address] [place], [place], [address] Phone [phone], Fax [phone]

## 2018-06-13 NOTE — Patient Instructions (Addendum)
Medication Instructions:  Continue same medications If you need a refill on your cardiac medications before your next appointment, please call your pharmacy.   Lab work: None ordered   Testing/Procedures: None ordered  Follow-Up: At [facility], you and your health needs are our priority.  As part of our continuing mission to provide you with exceptional heart care, we have created designated Provider Care Teams.  These Care Teams include your primary Cardiologist (physician) and Advanced Practice Providers (APPs -  Physician Assistants and Nurse Practitioners) who all work together to provide you with the care you need, when you need it.  Follow Up Appointment with Dr.Jordan  Thursday 06/30/18 at 1:40 pm.

## 2018-06-21 ENCOUNTER — Ambulatory Visit: Payer: Medicare HMO | Admitting: Internal Medicine

## 2018-06-28 NOTE — H&P (View-Only) (Signed)
Cardiology Office Note   Date:  06/30/2018   ID:  [facility], [facility] Black 1946-01-13, MRN [id number]  PCP:  Clinton Lima, MD  Cardiologist:    [name], MD   Chief Complaint  Patient presents with  . Atrial Fibrillation      History of Present Illness: Clinton Black is a 73 y.o. male who is seen for follow up atrial fibrillation. He has a history of thoracic aortic aneurysm.  He has a history of DM type 2, morbid obesity, HLD, and HTN.   In 2018 he was experiencing dyspnea, often awakening him at night. He states that since he stopped smoking marijuana this has largely resolved.  He was seen by Dr. Ronnald Black and noted to have frequent ectopy. Ecg showed PVC couplets. He was hypertensive and started on Edarbi.  Denied any chest pain or syncope. No edema. Quit smoking in 1996. Has lost 25-30 lbs this past year. He does have a family history of CAD.   On  his initial visit he underwent evaluation with an Echocardiogram that showed "normal" LV function with moderate AI and dilated aortic root at 44 mm. On my review I felt his EF was mildly impaired at 45%. Myoview study showed a fixed inferobasal defect, no ischemia. EF 39%. He was started on Toprol XL and a CT of the aorta was ordered. 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   He was seen in December 2018 with increased PND. Started on aldactone with clinical improvement. Also on metoprolol and losartan. Repeat Echo done and was stable. Reported low normal EF and only "mild" AI.   In November 2019  he had cystoscopy with implantation of Urolift devices. This was without complication and no arrhythmia reported.  Seen by Dr. Servando Black on Dec 6 and HR recorded at 74. When seen by Dr [facility] found to be in Afib with rate 91. He was started on Xarelto. On our evaluation he was in persistent AFib. Rate controlled on metoprolol.   On follow up he notes he is having more dyspnea. Also  having some dizziness and following the dizziness he gets nausea. No syncope. No chest pain. He has not missed any Xarelto doses.     Past Medical History:  Diagnosis Date  . Arthritis   . BPH (benign prostatic hyperplasia)   . Diabetes mellitus type 2 in obese (HCC)    diet controlled  . HTN (hypertension)   . Hyperlipidemia, mixed   . Insomnia   . Obesity   . Thoracic aortic aneurysm, without rupture (HCC)    4.7 cm per chest ct with contrast 11-04-17    Past Surgical History:  Procedure Laterality Date  . COLONOSCOPY  2016  . CYST EXCISION     polynomial  . CYSTOSCOPY WITH INSERTION OF UROLIFT N/A 04/18/2018   Procedure: CYSTOSCOPY WITH INSERTION OF UROLIFT;  Surgeon: Clinton Gustin, MD;  Location: [facility];  Service: Urology;  Laterality: N/A;  . KNEE SURGERY Right 2000   arthroscopy  . TONSILLECTOMY    . widson teeth extraction       Current Outpatient Medications  Medication Sig Dispense Refill  . atorvastatin (LIPITOR) 20 MG tablet Take 1 tablet (20 mg total) by mouth daily. (Patient taking differently: Take 20 mg by mouth at bedtime. ) 90 tablet 1  . finasteride (PROSCAR) 5 MG tablet Take 5 mg by mouth at bedtime.     Marland Kitchen  losartan (COZAAR) 50 MG tablet TAKE 1 TABLET BY MOUTH EVERY DAY (Patient taking differently: at bedtime. ) 90 tablet 1  . metoprolol succinate (TOPROL-XL) 25 MG 24 hr tablet TAKE 1 TABLET BY MOUTH EVERY DAY (Patient taking differently: at bedtime. ) 90 tablet 2  . rivaroxaban (XARELTO) 20 MG TABS tablet Take 1 tablet (20 mg total) by mouth daily with supper. 90 tablet 0  . spironolactone (ALDACTONE) 25 MG tablet Take 0.5 tablets (12.5 mg total) by mouth at bedtime. 45 tablet 3  . triazolam (HALCION) 0.25 MG tablet Take 1 tablet (0.25 mg total) by mouth at bedtime as needed. 30 tablet 3  . TURMERIC PO Take by mouth. 2 tabs per day     No current facility-administered medications for this visit.     Allergies:   Quinolones     Social History:  The patient  reports that he quit smoking about 24 years ago. His smoking use included cigarettes. He has a 7.50 pack-year smoking history. He has never used smokeless tobacco. He reports current alcohol use of about 14.0 standard drinks of alcohol per week. He reports current drug use. Drug: Marijuana.   Family History:  The patient's family history includes Aneurysm in his father; Arthritis in his mother; Heart disease in his father; Macular degeneration in his mother; Varicose Veins in his mother.    ROS:  Please see the history of present illness.   Otherwise, review of systems are positive for none.   All other systems are reviewed and negative.    PHYSICAL EXAM: VS:  BP (!) 112/58   Pulse 93   Ht 6\' 5"  (1.956 m)   Wt 287 lb 6.4 oz (130.4 kg)   BMI 34.08 kg/m  , BMI Body mass index is 34.08 kg/m. GENERAL:  Well appearing obese WM in NAD HEENT:  PERRL, EOMI, sclera are clear. Oropharynx is clear. NECK:  No jugular venous distention, carotid upstroke brisk and symmetric, no bruits, no thyromegaly or adenopathy LUNGS:  Clear to auscultation bilaterally CHEST:  Unremarkable HEART:  IRRR,  PMI not displaced or sustained,S1 and S2 within normal limits, no S3, no S4: no clicks, no rubs, no murmurs ABD:  Soft, nontender. BS +, no masses or bruits. No hepatomegaly, no splenomegaly EXT:  2 + pulses throughout, no edema, no cyanosis no clubbing SKIN:  Warm and dry.  No rashes NEURO:  Alert and oriented x 3. Cranial nerves II through XII intact. PSYCH:  Cognitively intact      EKG:  EKG is ordered today.Afib rate 93. occ PVC. T wave inversion inferiorly. I have personally reviewed and interpreted this study.   Recent Labs: 06/01/2018: ALT 21; BUN 19; Creatinine, Ser 0.99; Hemoglobin 14.8; Platelets 196.0; Potassium 4.5; Sodium 140; TSH 4.12    Lipid Panel    Component Value Date/Time   CHOL 110 06/01/2018 1328   CHOL 105 03/11/2017 1541   TRIG 137.0  06/01/2018 1328   HDL 34.30 (L) 06/01/2018 1328   HDL 36 (L) 03/11/2017 1541   CHOLHDL 3 06/01/2018 1328   VLDL 27.4 06/01/2018 1328   LDLCALC 48 06/01/2018 1328   LDLCALC 46 03/11/2017 1541   LDLDIRECT 120.0 05/11/2016 1433    dated 07/15/17: A1c 6.8%.  Wt Readings from Last 3 Encounters:  06/30/18 287 lb 6.4 oz (130.4 kg)  06/13/18 284 lb 12.8 oz (129.2 kg)  06/01/18 281 lb (127.5 kg)      Other studies Reviewed: Additional studies/ records that were reviewed today  include:    Myoview 02/12/17: Study Highlights    The left ventricular ejection fraction is moderately decreased (30-44%).  Nuclear stress EF: 39%.  There was no Clinton segment deviation noted during stress.  No T wave inversion was noted during stress.  This is an intermediate risk study due to reduced systolic function.  There is no ischemia.   CLINICAL DATA:  Thoracic aortic aneurysm without rupture.  EXAM: CT ANGIOGRAPHY CHEST WITH CONTRAST  TECHNIQUE: Multidetector CT imaging of the chest was performed using the standard protocol during bolus administration of intravenous contrast. Multiplanar CT image reconstructions and MIPs were obtained to evaluate the vascular anatomy.  CONTRAST:  73mL ISOVUE-370 IOPAMIDOL (ISOVUE-370) INJECTION 76%  COMPARISON:  CT scan of Nov 04, 2017.  FINDINGS: Cardiovascular: 5.4 cm ascending thoracic aortic aneurysm is noted which is not significantly changed compared to prior exam based on my own measurement using sagittal reconstructions. Transverse aortic arch measures 3.3 cm. Proximal descending thoracic aorta measures 4.2 cm. No dissection is noted. Atherosclerosis of thoracic aorta is noted. Great vessels are widely patent without significant stenosis. Normal cardiac size. No pericardial effusion is noted.  Mediastinum/Nodes: No enlarged mediastinal, hilar, or axillary lymph nodes. Thyroid gland, trachea, and esophagus demonstrate no significant  findings.  Lungs/Pleura: Lungs are clear. No pleural effusion or pneumothorax.  Upper Abdomen: No acute abnormality.  Musculoskeletal: No chest wall abnormality. No acute or significant osseous findings.  Review of the MIP images confirms the above findings.  IMPRESSION: 5.4 cm ascending thoracic aortic aneurysm is noted which is not significantly changed based on my own measurements of the prior exam. Recommend semi-annual imaging followup by CTA or MRA and referral to cardiothoracic surgery if not already obtained. This recommendation follows 2010 ACCF/AHA/AATS/ACR/ASA/SCA/SCAI/SIR/STS/SVM Guidelines for the Diagnosis and Management of Patients With Thoracic Aortic Disease. Circulation. 2010; 121: V035-K093.  Aortic Atherosclerosis (ICD10-I70.0).   Electronically Signed   By: Marijo Conception, M.D.   On: 05/19/2018 12:26  Echo 03/29/18: Study Conclusions  - Left ventricle: The cavity size was normal. There was moderate   basal hypertrophy of the septum with otherwise mild concentric   hypertrophy. Systolic function was normal. The estimated ejection   fraction was in the range of 50% to 55%. Wall motion was normal;   there were no regional wall motion abnormalities. Doppler   parameters are consistent with abnormal left ventricular   relaxation (grade 1 diastolic dysfunction). - Aortic valve: Transvalvular velocity was within the normal range.   There was no stenosis. There was moderate regurgitation. - Aorta: Ascending aortic diameter: 50 mm (S). 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 - Mitral valve: Transvalvular velocity was within the normal range.   There was no evidence for stenosis. There was trivial   regurgitation. - Left atrium: The atrium was moderately dilated. - Right ventricle: The cavity size was normal. Wall thickness was   normal. Systolic function was normal. - Pulmonary arteries: Systolic pressure was within the  normal   range. PA peak pressure: 24 mm Hg (S).  Impressions:  - Compared with the echo 05/2017, the ascending aorta aneurysm has   increased from 4.7 cm to 5.0 cm.  ASSESSMENT AND PLAN: 1. Thoracic aortic aneurysm. Increased from baseline of 4.6 to 5.0 cm. Followed by Dr. Servando Black with CT surgery. Plan to repeat CT in 6 months. Continue aggressive BP control. Continue Toprol XL 25 mg daily and losartan 50 mg daily.   Recommend avoiding heavy lifting or staining. Appears  that he is approaching need for elective aortic surgery. Will follow CT closely. If aorta enlarges more will need a [place]. 2. 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 Echo in October stable and EF had actually improved.  3. Chronic primarily diastolic CHF.continue metoprolol, losartan and aldactone.  4. Afib new onset in December and persistent. He is  symptomatic. Rate is controlled and metoprolol and he is now on Xarelto for [place] vasc score of 5. Has been anticoagulated for > 3 weeks. Will plan DCCV next week.   Ultimately could consider a MAZE procedure if needs Aortic surgery in the future.  5. HTN. Controlled.  6. DM type 2 7. HLD- on lipitor. Excellent control 8. Abnormal myoview. No ischemia but reduced EF. Following for now with no anginal symptoms. Will eventually need cardiac cath for evaluation depending on progression of AI/anuerysm.    Signed,  [name], MD ,[facility] 06/30/2018 1:50 PM    [facility] [address], [place], [place], [address] Phone [phone], Fax [phone]

## 2018-06-28 NOTE — Progress Notes (Signed)
Cardiology Office Note   Date:  06/30/2018   ID:  [facility], [facility] 04-Apr-1946, MRN [id number]  PCP:  [name], MD  Cardiologist:    [name], MD   Chief Complaint  Patient presents with  . Atrial Fibrillation      History of Present Illness: [facility] is a 73 y.o. male who is seen for follow up atrial fibrillation. He has a history of thoracic aortic aneurysm.  He has a history of DM type 2, morbid obesity, HLD, and HTN.   In 2018 he was experiencing dyspnea, often awakening him at night. He states that since he stopped smoking marijuana this has largely resolved.  He was seen by Dr. Ronnald Ramp and noted to have frequent ectopy. Ecg showed PVC couplets. He was hypertensive and started on Edarbi.  Denied any chest pain or syncope. No edema. Quit smoking in 1996. Has lost 25-30 lbs this past year. He does have a family history of CAD.   On  his initial visit he underwent evaluation with an Echocardiogram that showed "normal" LV function with moderate AI and dilated aortic root at 44 mm. On my review I felt his EF was mildly impaired at 45%. Myoview study showed a fixed inferobasal defect, no ischemia. EF 39%. He was started on Toprol XL and a CT of the aorta was ordered. 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   He was seen in December 2018 with increased PND. Started on aldactone with clinical improvement. Also on metoprolol and losartan. Repeat Echo done and was stable. Reported low normal EF and only "mild" AI.   In November 2019  he had cystoscopy with implantation of Urolift devices. This was without complication and no arrhythmia reported.  Seen by Dr. Servando Snare on Dec 6 and HR recorded at 53. When seen by Dr Ronnald Ramp found to be in Afib with rate 91. He was started on Xarelto. On our evaluation he was in persistent AFib. Rate controlled on metoprolol.   On follow up he notes he is having more dyspnea. Also  having some dizziness and following the dizziness he gets nausea. No syncope. No chest pain. He has not missed any Xarelto doses.     Past Medical History:  Diagnosis Date  . Arthritis   . BPH (benign prostatic hyperplasia)   . Diabetes mellitus type 2 in obese (HCC)    diet controlled  . HTN (hypertension)   . Hyperlipidemia, mixed   . Insomnia   . Obesity   . Thoracic aortic aneurysm, without rupture (HCC)    4.7 cm per chest ct with contrast 11-04-17    Past Surgical History:  Procedure Laterality Date  . COLONOSCOPY  2016  . CYST EXCISION     polynomial  . CYSTOSCOPY WITH INSERTION OF UROLIFT N/A 04/18/2018   Procedure: CYSTOSCOPY WITH INSERTION OF UROLIFT;  Surgeon: Cleon Gustin, MD;  Location: [facility];  Service: Urology;  Laterality: N/A;  . KNEE SURGERY Right 2000   arthroscopy  . TONSILLECTOMY    . widson teeth extraction       Current Outpatient Medications  Medication Sig Dispense Refill  . atorvastatin (LIPITOR) 20 MG tablet Take 1 tablet (20 mg total) by mouth daily. (Patient taking differently: Take 20 mg by mouth at bedtime. ) 90 tablet 1  . finasteride (PROSCAR) 5 MG tablet Take 5 mg by mouth at bedtime.     Marland Kitchen  losartan (COZAAR) 50 MG tablet TAKE 1 TABLET BY MOUTH EVERY DAY (Patient taking differently: at bedtime. ) 90 tablet 1  . metoprolol succinate (TOPROL-XL) 25 MG 24 hr tablet TAKE 1 TABLET BY MOUTH EVERY DAY (Patient taking differently: at bedtime. ) 90 tablet 2  . rivaroxaban (XARELTO) 20 MG TABS tablet Take 1 tablet (20 mg total) by mouth daily with supper. 90 tablet 0  . spironolactone (ALDACTONE) 25 MG tablet Take 0.5 tablets (12.5 mg total) by mouth at bedtime. 45 tablet 3  . triazolam (HALCION) 0.25 MG tablet Take 1 tablet (0.25 mg total) by mouth at bedtime as needed. 30 tablet 3  . TURMERIC PO Take by mouth. 2 tabs per day     No current facility-administered medications for this visit.     Allergies:   Quinolones     Social History:  The patient  reports that he quit smoking about 24 years ago. His smoking use included cigarettes. He has a 7.50 pack-year smoking history. He has never used smokeless tobacco. He reports current alcohol use of about 14.0 standard drinks of alcohol per week. He reports current drug use. Drug: Marijuana.   Family History:  The patient's family history includes Aneurysm in his father; Arthritis in his mother; Heart disease in his father; Macular degeneration in his mother; Varicose Veins in his mother.    ROS:  Please see the history of present illness.   Otherwise, review of systems are positive for none.   All other systems are reviewed and negative.    PHYSICAL EXAM: VS:  BP (!) 112/58   Pulse 93   Ht 6\' 5"  (1.956 m)   Wt 287 lb 6.4 oz (130.4 kg)   BMI 34.08 kg/m  , BMI Body mass index is 34.08 kg/m. GENERAL:  Well appearing obese WM in NAD HEENT:  PERRL, EOMI, sclera are clear. Oropharynx is clear. NECK:  No jugular venous distention, carotid upstroke brisk and symmetric, no bruits, no thyromegaly or adenopathy LUNGS:  Clear to auscultation bilaterally CHEST:  Unremarkable HEART:  IRRR,  PMI not displaced or sustained,S1 and S2 within normal limits, no S3, no S4: no clicks, no rubs, no murmurs ABD:  Soft, nontender. BS +, no masses or bruits. No hepatomegaly, no splenomegaly EXT:  2 + pulses throughout, no edema, no cyanosis no clubbing SKIN:  Warm and dry.  No rashes NEURO:  Alert and oriented x 3. Cranial nerves II through XII intact. PSYCH:  Cognitively intact      EKG:  EKG is ordered today.Afib rate 93. occ PVC. T wave inversion inferiorly. I have personally reviewed and interpreted this study.   Recent Labs: 06/01/2018: ALT 21; BUN 19; Creatinine, Ser 0.99; Hemoglobin 14.8; Platelets 196.0; Potassium 4.5; Sodium 140; TSH 4.12    Lipid Panel    Component Value Date/Time   CHOL 110 06/01/2018 1328   CHOL 105 03/11/2017 1541   TRIG 137.0  06/01/2018 1328   HDL 34.30 (L) 06/01/2018 1328   HDL 36 (L) 03/11/2017 1541   CHOLHDL 3 06/01/2018 1328   VLDL 27.4 06/01/2018 1328   LDLCALC 48 06/01/2018 1328   LDLCALC 46 03/11/2017 1541   LDLDIRECT 120.0 05/11/2016 1433    dated 07/15/17: A1c 6.8%.  Wt Readings from Last 3 Encounters:  06/30/18 287 lb 6.4 oz (130.4 kg)  06/13/18 284 lb 12.8 oz (129.2 kg)  06/01/18 281 lb (127.5 kg)      Other studies Reviewed: Additional studies/ records that were reviewed today  include:    Myoview 02/12/17: Study Highlights    The left ventricular ejection fraction is moderately decreased (30-44%).  Nuclear stress EF: 39%.  There was no ST segment deviation noted during stress.  No T wave inversion was noted during stress.  This is an intermediate risk study due to reduced systolic function.  There is no ischemia.   CLINICAL DATA:  Thoracic aortic aneurysm without rupture.  EXAM: CT ANGIOGRAPHY CHEST WITH CONTRAST  TECHNIQUE: Multidetector CT imaging of the chest was performed using the standard protocol during bolus administration of intravenous contrast. Multiplanar CT image reconstructions and MIPs were obtained to evaluate the vascular anatomy.  CONTRAST:  19mL ISOVUE-370 IOPAMIDOL (ISOVUE-370) INJECTION 76%  COMPARISON:  CT scan of Nov 04, 2017.  FINDINGS: Cardiovascular: 5.4 cm ascending thoracic aortic aneurysm is noted which is not significantly changed compared to prior exam based on my own measurement using sagittal reconstructions. Transverse aortic arch measures 3.3 cm. Proximal descending thoracic aorta measures 4.2 cm. No dissection is noted. Atherosclerosis of thoracic aorta is noted. Great vessels are widely patent without significant stenosis. Normal cardiac size. No pericardial effusion is noted.  Mediastinum/Nodes: No enlarged mediastinal, hilar, or axillary lymph nodes. Thyroid gland, trachea, and esophagus demonstrate no significant  findings.  Lungs/Pleura: Lungs are clear. No pleural effusion or pneumothorax.  Upper Abdomen: No acute abnormality.  Musculoskeletal: No chest wall abnormality. No acute or significant osseous findings.  Review of the MIP images confirms the above findings.  IMPRESSION: 5.4 cm ascending thoracic aortic aneurysm is noted which is not significantly changed based on my own measurements of the prior exam. Recommend semi-annual imaging followup by CTA or MRA and referral to cardiothoracic surgery if not already obtained. This recommendation follows 2010 ACCF/AHA/AATS/ACR/ASA/SCA/SCAI/SIR/STS/SVM Guidelines for the Diagnosis and Management of Patients With Thoracic Aortic Disease. Circulation. 2010; 121: A213-Y865.  Aortic Atherosclerosis (ICD10-I70.0).   Electronically Signed   By: Marijo Conception, M.D.   On: 05/19/2018 12:26  Echo 03/29/18: Study Conclusions  - Left ventricle: The cavity size was normal. There was moderate   basal hypertrophy of the septum with otherwise mild concentric   hypertrophy. Systolic function was normal. The estimated ejection   fraction was in the range of 50% to 55%. Wall motion was normal;   there were no regional wall motion abnormalities. Doppler   parameters are consistent with abnormal left ventricular   relaxation (grade 1 diastolic dysfunction). - Aortic valve: Transvalvular velocity was within the normal range.   There was no stenosis. There was moderate regurgitation. - Aorta: Ascending aortic diameter: 50 mm (S). 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 - Mitral valve: Transvalvular velocity was within the normal range.   There was no evidence for stenosis. There was trivial   regurgitation. - Left atrium: The atrium was moderately dilated. - Right ventricle: The cavity size was normal. Wall thickness was   normal. Systolic function was normal. - Pulmonary arteries: Systolic pressure was within the  normal   range. PA peak pressure: 24 mm Hg (S).  Impressions:  - Compared with the echo 05/2017, the ascending aorta aneurysm has   increased from 4.7 cm to 5.0 cm.  ASSESSMENT AND PLAN: 1. Thoracic aortic aneurysm. Increased from baseline of 4.6 to 5.0 cm. Followed by Dr. Servando Snare with CT surgery. Plan to repeat CT in 6 months. Continue aggressive BP control. Continue Toprol XL 25 mg daily and losartan 50 mg daily.   Recommend avoiding heavy lifting or staining. Appears  that he is approaching need for elective aortic surgery. Will follow CT closely. If aorta enlarges more will need a [place]. 2. 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 Echo in October stable and EF had actually improved.  3. Chronic primarily diastolic CHF.continue metoprolol, losartan and aldactone.  4. Afib new onset in December and persistent. He is  symptomatic. Rate is controlled and metoprolol and he is now on Xarelto for [place] vasc score of 5. Has been anticoagulated for > 3 weeks. Will plan DCCV next week.   Ultimately could consider a MAZE procedure if needs Aortic surgery in the future.  5. HTN. Controlled.  6. DM type 2 7. HLD- on lipitor. Excellent control 8. Abnormal myoview. No ischemia but reduced EF. Following for now with no anginal symptoms. Will eventually need cardiac cath for evaluation depending on progression of AI/anuerysm.    Signed,  [name], MD ,[facility] 06/30/2018 1:50 PM    [facility] [address], [place], [place], [address] Phone [phone], Fax [phone]

## 2018-06-30 ENCOUNTER — Ambulatory Visit: Payer: Medicare HMO | Admitting: Cardiology

## 2018-06-30 ENCOUNTER — Encounter: Payer: Self-pay | Admitting: Cardiology

## 2018-06-30 ENCOUNTER — Other Ambulatory Visit: Payer: Self-pay | Admitting: Cardiology

## 2018-06-30 VITALS — BP 112/58 | HR 93 | Ht 77.0 in | Wt 287.4 lb

## 2018-06-30 DIAGNOSIS — R0600 Dyspnea, unspecified: Secondary | ICD-10-CM

## 2018-06-30 DIAGNOSIS — I4819 Other persistent atrial fibrillation: Secondary | ICD-10-CM | POA: Diagnosis not present

## 2018-06-30 DIAGNOSIS — R0609 Other forms of dyspnea: Secondary | ICD-10-CM | POA: Diagnosis not present

## 2018-06-30 DIAGNOSIS — I712 Thoracic aortic aneurysm, without rupture, unspecified: Secondary | ICD-10-CM

## 2018-06-30 DIAGNOSIS — I5032 Chronic diastolic (congestive) heart failure: Secondary | ICD-10-CM | POA: Diagnosis not present

## 2018-06-30 NOTE — Addendum Note (Signed)
Addended by: Kathyrn Lass on: 06/30/2018 02:06 PM   Modules accepted: Orders

## 2018-06-30 NOTE — Patient Instructions (Addendum)
  You are scheduled for a Cardioversion on Tuesday 07/12/18 with Dr. Acie Fredrickson.  Please arrive at the [facility] (Main Entrance A) at [facility]: [address] [place], [place] [address] at 11:30 am. (1 hour prior to procedure unless lab work is needed; if lab work is needed arrive 1.5 hours ahead)  DIET: Nothing to eat or drink after midnight except a sip of water with medications (see medication instructions below)  Medication Instructions:  Hold Spironolactone morning of cardioversion  Continue your Xarelto You will need to continue your anticoagulant after your procedure until you  are told by your  Provider that it is safe to stop   Labs:  Bmet,Cbc Tuesday 07/05/18 at [place] office ( [place] )   You must have a responsible person to drive you home and stay in the waiting area during your procedure. Failure to do so could result in cancellation.  Bring your insurance cards.  *Special Note: Every effort is made to have your procedure done on time. Occasionally there are emergencies that occur at the hospital that may cause delays. Please be patient if a delay does occur.

## 2018-07-04 ENCOUNTER — Telehealth: Payer: Self-pay | Admitting: Internal Medicine

## 2018-07-04 ENCOUNTER — Other Ambulatory Visit: Payer: Self-pay

## 2018-07-04 DIAGNOSIS — I4891 Unspecified atrial fibrillation: Secondary | ICD-10-CM

## 2018-07-04 DIAGNOSIS — I10 Essential (primary) hypertension: Secondary | ICD-10-CM | POA: Diagnosis not present

## 2018-07-04 MED ORDER — RIVAROXABAN 20 MG PO TABS: 20.0000 mg | ORAL_TABLET | Freq: Every day | ORAL | 1 refills | Status: DC

## 2018-07-04 NOTE — Telephone Encounter (Signed)
Copied from [name] [id number]. Topic: Quick Communication - Rx Refill/Question >> Jul 04, 2018  2:23 PM Burchel, [name] wrote: Medication: Xarelto   Pt was receiving samples from Dr Ronnald Ramp and now needs rx sent to:   Preferred Pharmacy: [facility], [place] [phone] (Phone) [phone] (Fax)  Pt was advised that RX refills may take up to 3 business days. We ask that you follow-up with your pharmacy.

## 2018-07-04 NOTE — Telephone Encounter (Signed)
erx has been sent.  

## 2018-07-05 LAB — CBC WITH DIFFERENTIAL/PLATELET
Basophils Absolute: 0.1 10*3/uL (ref 0.0–0.2)
Basos: 1 %
EOS (ABSOLUTE): 0.6 10*3/uL — ABNORMAL HIGH (ref 0.0–0.4)
Eos: 7 %
Hematocrit: 44 % (ref 37.5–51.0)
Hemoglobin: 14.9 g/dL (ref 13.0–17.7)
Immature Grans (Abs): 0 10*3/uL (ref 0.0–0.1)
Immature Granulocytes: 0 %
Lymphocytes Absolute: 2.4 10*3/uL (ref 0.7–3.1)
Lymphs: 29 %
MCH: 29.9 pg (ref 26.6–33.0)
MCHC: 33.9 g/dL (ref 31.5–35.7)
MCV: 88 fL (ref 79–97)
Monocytes Absolute: 0.6 10*3/uL (ref 0.1–0.9)
Monocytes: 7 %
Neutrophils Absolute: 4.5 10*3/uL (ref 1.4–7.0)
Neutrophils: 56 %
Platelets: 200 10*3/uL (ref 150–450)
RBC: 4.99 x10E6/uL (ref 4.14–5.80)
RDW: 12.4 % (ref 11.6–15.4)
WBC: 8.2 10*3/uL (ref 3.4–10.8)

## 2018-07-05 LAB — BASIC METABOLIC PANEL
BUN/Creatinine Ratio: 18 (ref 10–24)
BUN: 17 mg/dL (ref 8–27)
CO2: 18 mmol/L — ABNORMAL LOW (ref 20–29)
Calcium: 9.3 mg/dL (ref 8.6–10.2)
Chloride: 105 mmol/L (ref 96–106)
Creatinine, Ser: 0.92 mg/dL (ref 0.76–1.27)
GFR calc Af Amer: 96 mL/min/{1.73_m2} (ref >59)
GFR calc non Af Amer: 83 mL/min/{1.73_m2} (ref >59)
Glucose: 144 mg/dL — ABNORMAL HIGH (ref 65–99)
Potassium: 4.5 mmol/L (ref 3.5–5.2)
Sodium: 137 mmol/L (ref 134–144)

## 2018-07-12 ENCOUNTER — Ambulatory Visit (HOSPITAL_COMMUNITY)
Admission: RE | Admit: 2018-07-12 | Discharge: 2018-07-12 | Disposition: A | Discharge status: Home / Self Care | Payer: Medicare HMO | Attending: Cardiovascular Disease | Admitting: Cardiovascular Disease

## 2018-07-12 ENCOUNTER — Ambulatory Visit (HOSPITAL_COMMUNITY): Payer: Medicare HMO | Admitting: Anesthesiology

## 2018-07-12 ENCOUNTER — Encounter (HOSPITAL_COMMUNITY): Payer: Self-pay | Admitting: *Deleted

## 2018-07-12 ENCOUNTER — Other Ambulatory Visit: Payer: Self-pay

## 2018-07-12 ENCOUNTER — Encounter (HOSPITAL_COMMUNITY)
Admission: RE | Disposition: A | Discharge status: Home / Self Care | Payer: Self-pay | Source: Home / Self Care | Attending: Cardiovascular Disease

## 2018-07-12 DIAGNOSIS — R6889 Other general symptoms and signs: Secondary | ICD-10-CM | POA: Diagnosis not present

## 2018-07-12 DIAGNOSIS — I110 Hypertensive heart disease with heart failure: Secondary | ICD-10-CM | POA: Insufficient documentation

## 2018-07-12 DIAGNOSIS — I4819 Other persistent atrial fibrillation: Secondary | ICD-10-CM

## 2018-07-12 DIAGNOSIS — I4891 Unspecified atrial fibrillation: Secondary | ICD-10-CM

## 2018-07-12 DIAGNOSIS — Z8249 Family history of ischemic heart disease and other diseases of the circulatory system: Secondary | ICD-10-CM | POA: Diagnosis not present

## 2018-07-12 DIAGNOSIS — N400 Enlarged prostate without lower urinary tract symptoms: Secondary | ICD-10-CM | POA: Insufficient documentation

## 2018-07-12 DIAGNOSIS — G4700 Insomnia, unspecified: Secondary | ICD-10-CM | POA: Insufficient documentation

## 2018-07-12 DIAGNOSIS — E1151 Type 2 diabetes mellitus with diabetic peripheral angiopathy without gangrene: Secondary | ICD-10-CM | POA: Diagnosis not present

## 2018-07-12 DIAGNOSIS — I5032 Chronic diastolic (congestive) heart failure: Secondary | ICD-10-CM | POA: Insufficient documentation

## 2018-07-12 DIAGNOSIS — I700 Atherosclerosis of aorta: Secondary | ICD-10-CM | POA: Diagnosis not present

## 2018-07-12 DIAGNOSIS — Z79899 Other long term (current) drug therapy: Secondary | ICD-10-CM | POA: Diagnosis not present

## 2018-07-12 DIAGNOSIS — Z6834 Body mass index (BMI) 34.0-34.9, adult: Secondary | ICD-10-CM | POA: Insufficient documentation

## 2018-07-12 DIAGNOSIS — Z87891 Personal history of nicotine dependence: Secondary | ICD-10-CM | POA: Diagnosis not present

## 2018-07-12 DIAGNOSIS — Z7901 Long term (current) use of anticoagulants: Secondary | ICD-10-CM | POA: Insufficient documentation

## 2018-07-12 DIAGNOSIS — M1990 Unspecified osteoarthritis, unspecified site: Secondary | ICD-10-CM | POA: Diagnosis not present

## 2018-07-12 DIAGNOSIS — E782 Mixed hyperlipidemia: Secondary | ICD-10-CM | POA: Insufficient documentation

## 2018-07-12 DIAGNOSIS — E119 Type 2 diabetes mellitus without complications: Secondary | ICD-10-CM | POA: Diagnosis not present

## 2018-07-12 DIAGNOSIS — I712 Thoracic aortic aneurysm, without rupture: Secondary | ICD-10-CM | POA: Diagnosis not present

## 2018-07-12 HISTORY — PX: CARDIOVERSION: SHX1299

## 2018-07-12 SURGERY — CARDIOVERSION
Anesthesia: General

## 2018-07-12 MED ORDER — PROPOFOL 10 MG/ML IV BOLUS
INTRAVENOUS | Status: DC | PRN
  Administered 2018-07-12: 80 mg via INTRAVENOUS
  Administered 2018-07-12: 50 mg via INTRAVENOUS

## 2018-07-12 MED ORDER — ATROPINE SULFATE 0.4 MG/ML IV SOSY
PREFILLED_SYRINGE | INTRAVENOUS | Status: DC | PRN
  Administered 2018-07-12: 1 mg via INTRAVENOUS

## 2018-07-12 MED ORDER — ATROPINE SULFATE 1 MG/10ML IJ SOSY
PREFILLED_SYRINGE | INTRAMUSCULAR | Status: AC
  Filled 2018-07-12: qty 10

## 2018-07-12 MED ORDER — LIDOCAINE 2% (20 MG/ML) 5 ML SYRINGE
INTRAMUSCULAR | Status: DC | PRN
  Administered 2018-07-12: 80 mg via INTRAVENOUS

## 2018-07-12 MED ORDER — SODIUM CHLORIDE 0.9 % IV SOLN
INTRAVENOUS | Status: DC | PRN
  Administered 2018-07-12: 12:00:00 via INTRAVENOUS

## 2018-07-12 NOTE — Anesthesia Preprocedure Evaluation (Signed)
Anesthesia Evaluation  Patient identified by MRN, date of birth, ID band Patient awake    Reviewed: Allergy & Precautions, NPO status , Patient's Chart, lab work & pertinent test results, reviewed documented beta blocker date and time   Airway Mallampati: I  TM Distance: <3 FB Neck ROM: Full    Dental  (+) Teeth Intact, Dental Advisory Given, Chipped,    Pulmonary former smoker,    Pulmonary exam normal breath sounds clear to auscultation       Cardiovascular hypertension, Pt. on home beta blockers and Pt. on medications + Peripheral Vascular Disease (Thoracic aortic aneurysm- 5cm), +CHF and + DOE  + Valvular Problems/Murmurs AI  Rhythm:Regular Rate:Normal + Diastolic murmurs Echo 97/41/63: Study Conclusions  - Left ventricle: The cavity size was normal. There was moderate basal hypertrophy of the septum with otherwise mild concentric hypertrophy. Systolic function was normal. The estimated ejection fraction was in the range of 50% to 55%. Wall motion was normal; there were no regional wall motion abnormalities. Doppler parameters are consistent with abnormal left ventricular relaxation (grade 1 diastolic dysfunction). - Aortic valve: Transvalvular velocity was within the normal range. There was no stenosis. There was moderate regurgitation. - Aorta: Ascending aortic diameter: 50 mm (S). 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 - Mitral valve: Transvalvular velocity was within the normal range. There was no evidence for stenosis. There was trivial regurgitation. - Left atrium: The atrium was moderately dilated. - Right ventricle: The cavity size was normal. Wall thickness was normal. Systolic function was normal. - Pulmonary arteries: Systolic pressure was within the normal range. PA peak pressure: 24 mm Hg (S).  Impressions:  - Compared with the echo 05/2017, the ascending aorta aneurysm has increased from 4.7  cm to 5.0 cm.   Neuro/Psych negative neurological ROS     GI/Hepatic negative GI ROS, Neg liver ROS,   Endo/Other  diabetes, Well Controlled, Type 2Obesity   Renal/GU negative Renal ROS   BENIGN PROSTATIC HYPERPLASIA    Musculoskeletal  (+) Arthritis ,   Abdominal   Peds  Hematology negative hematology ROS (+)   Anesthesia Other Findings Day of surgery medications reviewed with the patient.  Reproductive/Obstetrics                             Anesthesia Physical  Anesthesia Plan  ASA: III  Anesthesia Plan: General   Post-op Pain Management:    Induction: Intravenous  PONV Risk Score and Plan: 3 and Ondansetron, Dexamethasone and Treatment may vary due to age or medical condition  Airway Management Planned: Nasal Cannula, Mask and Simple Face Mask  Additional Equipment:   Intra-op Plan:   Post-operative Plan: Extubation in OR  Informed Consent: I have reviewed the patients History and Physical, chart, labs and discussed the procedure including the risks, benefits and alternatives for the proposed anesthesia with the patient or authorized representative who has indicated his/her understanding and acceptance.     Dental advisory given  Plan Discussed with: CRNA, Anesthesiologist and Surgeon  Anesthesia Plan Comments:         Anesthesia Quick Evaluation

## 2018-07-12 NOTE — Interval H&P Note (Signed)
History and Physical Interval Note:  07/12/2018 5:11 PM  Clinton Black  has presented today for surgery, with the diagnosis of AFIB  The various methods of treatment have been discussed with the patient and family. After consideration of risks, benefits and other options for treatment, the patient has consented to  Procedure(s): CARDIOVERSION (N/A) as a surgical intervention .  The patient's history has been reviewed, patient examined, no change in status, stable for surgery.  I have reviewed the patient's chart and labs.  Questions were answered to the patient's satisfaction.     Mertie Moores

## 2018-07-12 NOTE — Transfer of Care (Signed)
Immediate Anesthesia Transfer of Care Note  Patient: Clinton Black  Procedure(s) Performed: CARDIOVERSION (N/A )  Patient Location: Endoscopy Unit  Anesthesia Type:General  Level of Consciousness: awake, alert  and oriented  Airway & Oxygen Therapy: Patient Spontanous Breathing and Patient connected to nasal cannula oxygen  Post-op Assessment: Report given to RN and Post -op Vital signs reviewed and stable  Post vital signs: Reviewed and stable  Last Vitals:  Vitals Value Taken Time  BP 123/68   Temp    Pulse 87   Resp 16   SpO2 96%     Last Pain:  Vitals:   07/12/18 1119  TempSrc: Oral  PainSc: 0-No pain         Complications: No apparent anesthesia complications

## 2018-07-12 NOTE — Discharge Instructions (Signed)
Electrical Cardioversion, Care After °This sheet gives you information about how to care for yourself after your procedure. Your health care provider may also give you more specific instructions. If you have problems or questions, contact your health care provider. °What can I expect after the procedure? °After the procedure, it is common to have: °· Some redness on the skin where the shocks were given. °Follow these instructions at home: ° °· Do not drive for 24 hours if you were given a medicine to help you relax (sedative). °· Take over-the-counter and prescription medicines only as told by your health care provider. °· Ask your health care provider how to check your pulse. Check it often. °· Rest for 48 hours after the procedure or as told by your health care provider. °· Avoid or limit your caffeine use as told by your health care provider. °Contact a health care provider if: °· You feel like your heart is beating too quickly or your pulse is not regular. °· You have a serious muscle cramp that does not go away. °Get help right away if: ° °· You have discomfort in your chest. °· You are dizzy or you feel faint. °· You have trouble breathing or you are short of breath. °· Your speech is slurred. °· You have trouble moving an arm or leg on one side of your body. °· Your fingers or toes turn cold or blue. °This information is not intended to replace advice given to you by your health care provider. Make sure you discuss any questions you have with your health care provider. °Document Released: 03/22/2013 Document Revised: 01/03/2016 Document Reviewed: 12/06/2015 °Elsevier Interactive Patient Education © 2019 [employer] ° °

## 2018-07-12 NOTE — CV Procedure (Signed)
    Cardioversion Note  Clinton Black [id number] 1945-10-21  Procedure: DC Cardioversion Indications: atrial fib   Procedure Details Consent: Obtained Time Out: Verified patient identification, verified procedure, site/side was marked, verified correct patient position, special equipment/implants available, Radiology Safety Procedures followed,  medications/allergies/relevent history reviewed, required imaging and test results available.  Performed  The patient has been on adequate anticoagulation.  The patient received IV Lidocaine 60 mg followed by Propofol 130 mg  for sedation.  Synchronous cardioversion was performed at 120, 200, 200 joules.   He developed marked bradycardia following the successful 3rd cardioversion   The cardioversion was successful     Complications: Complications of bradycardia .   this responded to Atropine 1 mg IV  Patient did tolerate procedure well.   Thayer Headings, Brooke Bonito., MD, [facility] 07/12/2018, 1:18 PM

## 2018-07-13 NOTE — Anesthesia Postprocedure Evaluation (Signed)
Anesthesia Post Note  Patient: Clinton Black  Procedure(s) Performed: CARDIOVERSION (N/A )     Patient location during evaluation: PACU Anesthesia Type: General Level of consciousness: awake and alert Pain management: pain level controlled Vital Signs Assessment: post-procedure vital signs reviewed and stable Respiratory status: spontaneous breathing, nonlabored ventilation, respiratory function stable and patient connected to nasal cannula oxygen Cardiovascular status: blood pressure returned to baseline and stable Postop Assessment: no apparent nausea or vomiting Anesthetic complications: no    Last Vitals:  Vitals:   07/12/18 1350 07/12/18 1400  BP: 99/71 126/71  Pulse: 78 72  Resp: (!) 21 (!) 9  Temp:    SpO2: 99% 98%    Last Pain:  Vitals:   07/12/18 1400  TempSrc:   PainSc: 0-No pain   Pain Goal:                   ,

## 2018-07-15 ENCOUNTER — Encounter (HOSPITAL_COMMUNITY): Payer: Self-pay | Admitting: Cardiovascular Disease

## 2018-07-19 ENCOUNTER — Telehealth: Payer: Self-pay | Admitting: Cardiology

## 2018-07-19 NOTE — Telephone Encounter (Signed)
° ° ° °  1. Are you currently SOB (can you hear that pt is SOB on the phone)? no  2. How long have you been experiencing SOB?  4 days  3. Are you SOB when sitting or when up moving around? Sitting and moving   4. Are you currently experiencing any other symptoms? Dizzy, nausea, SOB

## 2018-07-19 NOTE — Telephone Encounter (Signed)
We will see next week and check Ecg. If back in Afib we will discuss other options for treatment.  [name] MD, [facility]

## 2018-07-19 NOTE — Telephone Encounter (Signed)
Spoke with pt, he had a DCCV last week and feels like he is out of rhythm again. He reports feeling SOB and like he did prior to the DCCV. He has a follow up with dr [name] 07-26-2018 and wonders what he needs to do now or just wait for appointment. Will forward to dr [name] to review and advise.

## 2018-07-20 NOTE — Telephone Encounter (Signed)
Spoke to patient Dr.Jordan advised to keep your appointment as planned.He will discuss other options for treatment.Appointment moved up with Dr.Jordan to [facility] 2/10 at 2:40 pm.

## 2018-07-22 NOTE — Progress Notes (Signed)
Cardiology Office Note   Date:  07/25/2018   ID:  Danh, Bayus 1946-06-07, MRN [id number]  PCP:  [name], MD  Cardiologist:    [name], MD   Chief Complaint  Patient presents with  . Atrial Fibrillation  . Congestive Heart Failure      History of Present Illness: KINSLEY NICKLAUS is a 73 y.o. male who is seen for follow up atrial fibrillation. He has a history of thoracic aortic aneurysm.  He has a history of DM type 2, morbid obesity, HLD, and HTN.   In 2018 he was experiencing dyspnea, often awakening him at night. He states that since he stopped smoking marijuana this has largely resolved.  He was seen by Dr. Ronnald Ramp and noted to have frequent ectopy. Ecg showed PVC couplets. He was hypertensive and started on Edarbi.  Denied any chest pain or syncope. No edema. Quit smoking in 1996. Has lost 25-30 lbs this past year. He does have a family history of CAD.   On  his initial visit he underwent evaluation with an Echocardiogram that showed "normal" LV function with moderate AI and dilated aortic root at 44 mm. On my review I felt his EF was mildly impaired at 45%. Myoview study showed a fixed inferobasal defect, no ischemia. EF 39%. He was started on Toprol XL and a CT of the aorta was ordered. 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   He was seen in December 2018 with increased PND. Started on aldactone with clinical improvement. Also on metoprolol and losartan. Repeat Echo done and was stable. Reported low normal EF and only "mild" AI.   Echo in October 2019 showed EF 50-55%. Diastolic dysfunction. Aortic dimension 5 cm.   In November 2019  he had cystoscopy with implantation of Urolift devices. This was without complication and no arrhythmia reported.  Seen by Dr. Servando Snare on Dec 6 and HR recorded at 14. When seen by Dr Ronnald Ramp found to be in Afib with rate 91. He was started on Xarelto. On our evaluation he  was in persistent AFib. Rate controlled on metoprolol.   On 07/12/18 he underwent successful DCCV. One week later he felt like he was out of rhythm. He did note that when he was in NSR he felt much better with decreased SOB and improved exercise duration and energy. He remains compliant with medication.     Past Medical History:  Diagnosis Date  . Arthritis   . BPH (benign prostatic hyperplasia)   . Diabetes mellitus type 2 in obese (HCC)    diet controlled  . HTN (hypertension)   . Hyperlipidemia, mixed   . Insomnia   . Obesity   . Thoracic aortic aneurysm, without rupture (HCC)    4.7 cm per chest ct with contrast 11-04-17    Past Surgical History:  Procedure Laterality Date  . CARDIOVERSION N/A 07/12/2018   Procedure: CARDIOVERSION;  Surgeon: Acie Fredrickson Wonda Cheng, MD;  Location: [facility] ENDOSCOPY;  Service: Cardiovascular;  Laterality: N/A;  . COLONOSCOPY  2016  . CYST EXCISION     polynomial  . CYSTOSCOPY WITH INSERTION OF UROLIFT N/A 04/18/2018   Procedure: CYSTOSCOPY WITH INSERTION OF UROLIFT;  Surgeon: Cleon Gustin, MD;  Location: [facility];  Service: Urology;  Laterality: N/A;  . KNEE SURGERY Right 2000   arthroscopy  . TONSILLECTOMY    . widson teeth extraction  Current Outpatient Medications  Medication Sig Dispense Refill  . atorvastatin (LIPITOR) 20 MG tablet Take 1 tablet (20 mg total) by mouth daily. (Patient taking differently: Take 20 mg by mouth at bedtime. ) 90 tablet 1  . losartan (COZAAR) 50 MG tablet TAKE 1 TABLET BY MOUTH EVERY DAY (Patient taking differently: Take 50 mg by mouth at bedtime. ) 90 tablet 1  . metoprolol succinate (TOPROL-XL) 25 MG 24 hr tablet TAKE 1 TABLET BY MOUTH EVERY DAY (Patient taking differently: Take 25 mg by mouth at bedtime. ) 90 tablet 2  . rivaroxaban (XARELTO) 20 MG TABS tablet Take 1 tablet (20 mg total) by mouth daily with supper. 90 tablet 1  . spironolactone (ALDACTONE) 25 MG tablet Take 0.5 tablets  (12.5 mg total) by mouth at bedtime. 45 tablet 3  . triazolam (HALCION) 0.25 MG tablet Take 1 tablet (0.25 mg total) by mouth at bedtime as needed. (Patient taking differently: Take 0.25 mg by mouth at bedtime as needed for sleep. ) 30 tablet 3  . TURMERIC PO Take 10 mLs by mouth daily.      No current facility-administered medications for this visit.     Allergies:   Quinolones    Social History:  The patient  reports that he quit smoking about 24 years ago. His smoking use included cigarettes. He has a 7.50 pack-year smoking history. He has never used smokeless tobacco. He reports current alcohol use of about 14.0 standard drinks of alcohol per week. He reports current drug use. Drug: Marijuana.   Family History:  The patient's family history includes Aneurysm in his father; Arthritis in his mother; Heart disease in his father; Macular degeneration in his mother; Varicose Veins in his mother.    ROS:  Please see the history of present illness.   Otherwise, review of systems are positive for none.   All other systems are reviewed and negative.    PHYSICAL EXAM: VS:  BP 136/74   Pulse 88   Ht 6\' 5"  (1.956 m)   Wt 289 lb 6.4 oz (131.3 kg)   SpO2 94%   BMI 34.32 kg/m  , BMI Body mass index is 34.32 kg/m. GENERAL:  Well appearing obese WM in NAD HEENT:  PERRL, EOMI, sclera are clear. Oropharynx is clear. NECK:  No jugular venous distention, carotid upstroke brisk and symmetric, no bruits, no thyromegaly or adenopathy LUNGS:  Clear to auscultation bilaterally CHEST:  Unremarkable HEART:  IRRR,  PMI not displaced or sustained,S1 and S2 within normal limits, no S3, no S4: no clicks, no rubs, no murmurs ABD:  Soft, nontender. BS +, no masses or bruits. No hepatomegaly, no splenomegaly EXT:  2 + pulses throughout, no edema, no cyanosis no clubbing SKIN:  Warm and dry.  No rashes NEURO:  Alert and oriented x 3. Cranial nerves II through XII intact. PSYCH:  Cognitively intact  EKG:  EKG  is ordered today.Afib rate 88. occ PVC. T wave flattening  inferiorly. QTc 442 msec.I have personally reviewed and interpreted this study.   Recent Labs: 06/01/2018: ALT 21; TSH 4.12 07/04/2018: BUN 17; Creatinine, Ser 0.92; Hemoglobin 14.9; Platelets 200; Potassium 4.5; Sodium 137    Lipid Panel    Component Value Date/Time   CHOL 110 06/01/2018 1328   CHOL 105 03/11/2017 1541   TRIG 137.0 06/01/2018 1328   HDL 34.30 (L) 06/01/2018 1328   HDL 36 (L) 03/11/2017 1541   CHOLHDL 3 06/01/2018 1328   VLDL 27.4 06/01/2018 1328  LDLCALC 48 06/01/2018 1328   LDLCALC 46 03/11/2017 1541   LDLDIRECT 120.0 05/11/2016 1433    dated 07/15/17: A1c 6.8%.  Wt Readings from Last 3 Encounters:  07/25/18 289 lb 6.4 oz (131.3 kg)  06/30/18 287 lb 6.4 oz (130.4 kg)  06/13/18 284 lb 12.8 oz (129.2 kg)      Other studies Reviewed: Additional studies/ records that were reviewed today include:    Myoview 02/12/17: Study Highlights    The left ventricular ejection fraction is moderately decreased (30-44%).  Nuclear stress EF: 39%.  There was no ST segment deviation noted during stress.  No T wave inversion was noted during stress.  This is an intermediate risk study due to reduced systolic function.  There is no ischemia.   CLINICAL DATA:  Thoracic aortic aneurysm without rupture.  EXAM: CT ANGIOGRAPHY CHEST WITH CONTRAST  TECHNIQUE: Multidetector CT imaging of the chest was performed using the standard protocol during bolus administration of intravenous contrast. Multiplanar CT image reconstructions and MIPs were obtained to evaluate the vascular anatomy.  CONTRAST:  77mL ISOVUE-370 IOPAMIDOL (ISOVUE-370) INJECTION 76%  COMPARISON:  CT scan of Nov 04, 2017.  FINDINGS: Cardiovascular: 5.4 cm ascending thoracic aortic aneurysm is noted which is not significantly changed compared to prior exam based on my own measurement using sagittal reconstructions. Transverse  aortic arch measures 3.3 cm. Proximal descending thoracic aorta measures 4.2 cm. No dissection is noted. Atherosclerosis of thoracic aorta is noted. Great vessels are widely patent without significant stenosis. Normal cardiac size. No pericardial effusion is noted.  Mediastinum/Nodes: No enlarged mediastinal, hilar, or axillary lymph nodes. Thyroid gland, trachea, and esophagus demonstrate no significant findings.  Lungs/Pleura: Lungs are clear. No pleural effusion or pneumothorax.  Upper Abdomen: No acute abnormality.  Musculoskeletal: No chest wall abnormality. No acute or significant osseous findings.  Review of the MIP images confirms the above findings.  IMPRESSION: 5.4 cm ascending thoracic aortic aneurysm is noted which is not significantly changed based on my own measurements of the prior exam. Recommend semi-annual imaging followup by CTA or MRA and referral to cardiothoracic surgery if not already obtained. This recommendation follows 2010 ACCF/AHA/AATS/ACR/ASA/SCA/SCAI/SIR/STS/SVM Guidelines for the Diagnosis and Management of Patients With Thoracic Aortic Disease. Circulation. 2010; 121: C003-K917.  Aortic Atherosclerosis (ICD10-I70.0).   Electronically Signed   By: Marijo Conception, M.D.   On: 05/19/2018 12:26  Echo 03/29/18: Study Conclusions  - Left ventricle: The cavity size was normal. There was moderate   basal hypertrophy of the septum with otherwise mild concentric   hypertrophy. Systolic function was normal. The estimated ejection   fraction was in the range of 50% to 55%. Wall motion was normal;   there were no regional wall motion abnormalities. Doppler   parameters are consistent with abnormal left ventricular   relaxation (grade 1 diastolic dysfunction). - Aortic valve: Transvalvular velocity was within the normal range.   There was no stenosis. There was moderate regurgitation. - Aorta: Ascending aortic diameter: 50 mm (S). 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 - Mitral valve: Transvalvular velocity was within the normal range.   There was no evidence for stenosis. There was trivial   regurgitation. - Left atrium: The atrium was moderately dilated. - Right ventricle: The cavity size was normal. Wall thickness was   normal. Systolic function was normal. - Pulmonary arteries: Systolic pressure was within the normal   range. PA peak pressure: 24 mm Hg (S).  Impressions:  - Compared with the echo  05/2017, the ascending aorta aneurysm has   increased from 4.7 cm to 5.0 cm.  ASSESSMENT AND PLAN: 1. Thoracic aortic aneurysm. Increased from baseline of 4.6 to 5.0 cm. Followed by Dr. Servando Snare with CT surgery. Plan to repeat CT in 6 months. Continue aggressive BP control. Continue Toprol XL 25 mg daily and losartan 50 mg daily.   Recommend avoiding heavy lifting or staining. Appears that he is approaching need for elective aortic surgery. Will follow CT closely. If aorta enlarges more will need a [place]. He is scheduled for follow up with Dr Servando Snare in May.  2. 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 Echo in October stable and EF had actually improved. Still low normal to mildly impaired.  3. Chronic combined systolic/ diastolic CHF.continue metoprolol, losartan and aldactone.  4. Afib new onset in December and persistent. S/p DCCV with early return of Afib.  He is quite symptomatic. Rate is controlled and metoprolol and he is  on Xarelto for [place] vasc score of 5. Given symptom level I think we need to try and maintain NSR. Will refer to Afib clinic for consideration of AAD therapy. Given symptoms of CHF and variable LV function I think Tikosyn or amiodarone are likely to be his best options. Tikosyn may be limited by consideration of cost. Will defer decision to Afib clinic.  Ultimately could consider a MAZE procedure if needs Aortic surgery in the future.  5. HTN. Controlled.  6. DM type 2 7. HLD- on lipitor.  Excellent control 8. Abnormal myoview in the past without ischemia but reduced EF.   Following for now with no anginal symptoms. Will eventually need cardiac cath for evaluation depending on progression of AI/anuerysm.    Signed,  [name], MD ,[facility] 07/25/2018 3:39 PM    [facility] [address], [place], [place], [address] Phone [phone], Fax [phone]

## 2018-07-25 ENCOUNTER — Encounter: Payer: Self-pay | Admitting: Cardiology

## 2018-07-25 ENCOUNTER — Ambulatory Visit: Payer: Medicare HMO | Admitting: Cardiology

## 2018-07-25 VITALS — BP 136/74 | HR 88 | Ht 77.0 in | Wt 289.4 lb

## 2018-07-25 DIAGNOSIS — K267 Chronic duodenal ulcer without hemorrhage or perforation: Secondary | ICD-10-CM

## 2018-07-25 DIAGNOSIS — I10 Essential (primary) hypertension: Secondary | ICD-10-CM

## 2018-07-25 DIAGNOSIS — I712 Thoracic aortic aneurysm, without rupture, unspecified: Secondary | ICD-10-CM

## 2018-07-25 DIAGNOSIS — I4891 Unspecified atrial fibrillation: Secondary | ICD-10-CM | POA: Diagnosis not present

## 2018-07-25 DIAGNOSIS — K257 Chronic gastric ulcer without hemorrhage or perforation: Secondary | ICD-10-CM

## 2018-07-25 NOTE — Patient Instructions (Signed)
Medication Instructions:  Continue same medications If you need a refill on your cardiac medications before your next appointment, please call your pharmacy.   Lab work: None ordered   Testing/Procedures: None ordered  Follow-Up: At [facility], you and your health needs are our priority.  As part of our continuing mission to provide you with exceptional heart care, we have created designated Provider Care Teams.  These Care Teams include your primary Cardiologist (physician) and Advanced Practice Providers (APPs -  Physician Assistants and Nurse Practitioners) who all work together to provide you with the care you need, when you need it. . Schedule appointment with Afib Clinic

## 2018-07-26 ENCOUNTER — Ambulatory Visit: Payer: Medicare HMO | Admitting: Cardiology

## 2018-07-26 NOTE — Progress Notes (Signed)
Primary Care Physician: [name], MD Primary Cardiologist: Dr [name] Referring Physician: Dr [name]   Clinton Black is a 73 y.o. male with a history of persistent atrial fibrillation, thoracic aortic aneurysm, mod AI, combined systolic/diastolic CHF, HTN, DM, HLD who presents for consultation in the [facility].  The patient was initially diagnosed with atrial fibrillation 05/2018 after presenting to his PCP and ECG showed afib. He underwent DCCV on 07/12/18 but unfortunately had return of afib. While in SR, he had improvement in his SOB and exercise tolerance.   Today, he denies symptoms of palpitations, chest pain, orthopnea, PND, lower extremity edema, dizziness, presyncope, syncope, snoring, daytime somnolence, bleeding, or neurologic sequela. The patient is tolerating medications without difficulties and is otherwise without complaint today.    Atrial Fibrillation Risk Factors:  he does not have symptoms or diagnosis of sleep apnea. he does not have a history of rheumatic fever. he does have a history of alcohol use. One drink per night. Former Chief Operating Officer.  The patient does have a history of early familial atrial fibrillation or other arrhythmias. 2 sisters have afib.  he has a BMI of Body mass index is 34.34 kg/m.Marland Kitchen Filed Weights   07/27/18 1428  Weight: 131.4 kg    Family History  Problem Relation Age of Onset  . Aneurysm Father   . Heart disease Father   . Varicose Veins Mother   . Arthritis Mother   . Macular degeneration Mother   . Cancer Neg Hx   . Colon cancer Neg Hx   . Esophageal cancer Neg Hx   . Rectal cancer Neg Hx   . Stomach cancer Neg Hx      Atrial Fibrillation Management history:  Previous antiarrhythmic drugs: none Previous cardioversions: 07/12/18 Previous ablations: none CHADS2VASC score: 5 (age, DM, HTN, vascular, CHF) Anticoagulation history: Xarelto   Past Medical History:  Diagnosis Date  . Arthritis    . BPH (benign prostatic hyperplasia)   . Diabetes mellitus type 2 in obese (HCC)    diet controlled  . HTN (hypertension)   . Hyperlipidemia, mixed   . Insomnia   . Obesity   . Thoracic aortic aneurysm, without rupture (HCC)    4.7 cm per chest ct with contrast 11-04-17   Past Surgical History:  Procedure Laterality Date  . CARDIOVERSION N/A 07/12/2018   Procedure: CARDIOVERSION;  Surgeon: Acie Fredrickson Wonda Cheng, MD;  Location: [facility] ENDOSCOPY;  Service: Cardiovascular;  Laterality: N/A;  . COLONOSCOPY  2016  . CYST EXCISION     polynomial  . CYSTOSCOPY WITH INSERTION OF UROLIFT N/A 04/18/2018   Procedure: CYSTOSCOPY WITH INSERTION OF UROLIFT;  Surgeon: Cleon Gustin, MD;  Location: [facility];  Service: Urology;  Laterality: N/A;  . KNEE SURGERY Right 2000   arthroscopy  . TONSILLECTOMY    . widson teeth extraction      Current Outpatient Medications  Medication Sig Dispense Refill  . atorvastatin (LIPITOR) 20 MG tablet Take 1 tablet (20 mg total) by mouth daily. (Patient taking differently: Take 20 mg by mouth at bedtime. ) 90 tablet 1  . losartan (COZAAR) 50 MG tablet TAKE 1 TABLET BY MOUTH EVERY DAY (Patient taking differently: Take 50 mg by mouth at bedtime. ) 90 tablet 1  . metoprolol succinate (TOPROL-XL) 25 MG 24 hr tablet TAKE 1 TABLET BY MOUTH EVERY DAY (Patient taking differently: Take 25 mg by mouth at bedtime. ) 90 tablet 2  . rivaroxaban (XARELTO)  20 MG TABS tablet Take 1 tablet (20 mg total) by mouth daily with supper. 90 tablet 1  . spironolactone (ALDACTONE) 25 MG tablet Take 0.5 tablets (12.5 mg total) by mouth at bedtime. 45 tablet 3  . triazolam (HALCION) 0.25 MG tablet Take 1 tablet (0.25 mg total) by mouth at bedtime as needed. (Patient taking differently: Take 0.25 mg by mouth at bedtime as needed for sleep. ) 30 tablet 3  . TURMERIC PO Take 10 mLs by mouth daily.     Marland Kitchen amiodarone (PACERONE) 200 MG tablet Take 2 tablets twice a day for 1 week then  reduce to 1 tablet twice a day for 3 weeks 70 tablet 0   No current facility-administered medications for this encounter.     Allergies  Allergen Reactions  . Quinolones Other (See Comments)    Patient was warned about not using Cipro and similar antibiotics. Recent studies have raised concern that fluoroquinolone antibiotics could be associated with an increased risk of aortic aneurysm Fluoroquinolones have non-antimicrobial properties that might jeopardise the integrity of the extracellular matrix of the vascular wall In a  propensity score matched cohort study in [place], there was a 66% increased rate of aortic aneurysm or dissection associated with oral fluoroquinolone use, compared wit    Social History   Socioeconomic History  . Marital status: Single    Spouse name: Not on file  . Number of children: Not on file  . Years of education: Not on file  . Highest education level: Not on file  Occupational History  . Not on file  Social Needs  . Financial resource strain: Not on file  . Food insecurity:    Worry: Not on file    Inability: Not on file  . Transportation needs:    Medical: Not on file    Non-medical: Not on file  Tobacco Use  . Smoking status: Former Smoker    Packs/day: 1.50    Years: 5.00    Pack years: 7.50    Types: Cigarettes    Last attempt to quit: 06/15/1994    Years since quitting: 24.1  . Smokeless tobacco: Never Used  Substance and Sexual Activity  . Alcohol use: Yes    Alcohol/week: 14.0 standard drinks    Types: 14 Shots of liquor per week    Comment: 1 drink of liquor per day  . Drug use: Yes    Types: Marijuana    Comment: occasionally marijuana  . Sexual activity: Yes    Partners: Female    Birth control/protection: Condom    Comment: condom use most of the time but not always  Lifestyle  . Physical activity:    Days per week: Not on file    Minutes per session: Not on file  . Stress: Not on file  Relationships  . Social  connections:    Talks on phone: Not on file    Gets together: Not on file    Attends religious service: Not on file    Active member of club or organization: Not on file    Attends meetings of clubs or organizations: Not on file    Relationship status: Not on file  . Intimate partner violence:    Fear of current or ex partner: Not on file    Emotionally abused: Not on file    Physically abused: Not on file    Forced sexual activity: Not on file  Other Topics Concern  . Not on file  Social  History Narrative   [place] and [employer]. married '72- 3 years/divorced; married '89- 3 years/divorced;. married '03 - 3 years/divorced. 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     ROS- All systems are reviewed and negative except as per the HPI above.  Physical Exam: Vitals:   07/27/18 1428  BP: 132/70  Pulse: 92  Weight: 131.4 kg  Height: 6\' 5"  (1.956 m)    GEN- The patient is well appearing obese male, alert and oriented x 3 today.   Head- normocephalic, atraumatic Eyes-  Sclera clear, conjunctiva pink Ears- hearing intact Oropharynx- clear Neck- supple  Lungs- Clear to ausculation bilaterally, normal work of breathing Heart- irregular rate and rhythm, no murmurs, rubs or gallops  GI- soft, NT, ND, + BS Extremities- no clubbing, cyanosis, or edema MS- no significant deformity or atrophy Skin- no rash or lesion Psych- euthymic mood, full affect Neuro- strength and sensation are intact  Wt Readings from Last 3 Encounters:  07/27/18 131.4 kg  07/25/18 131.3 kg  06/30/18 130.4 kg    EKG today demonstrates afib HR 92, PVCs, QRS 104, QTc 455  Echo 03/29/18 demonstrated  - Left ventricle: The cavity size was normal. There was moderate   basal hypertrophy of the septum with otherwise mild concentric   hypertrophy. Systolic function was normal. The estimated ejection   fraction was in the range of 50% to 55%. Wall motion was normal;   there were no regional wall motion  abnormalities. Doppler   parameters are consistent with abnormal left ventricular   relaxation (grade 1 diastolic dysfunction). - Aortic valve: Transvalvular velocity was within the normal range.   There was no stenosis. There was moderate regurgitation. - Aorta: Ascending aortic diameter: 50 mm (S). 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 - Mitral valve: Transvalvular velocity was within the normal range.   There was no evidence for stenosis. There was trivial   regurgitation. - Left atrium: The atrium was moderately dilated. - Right ventricle: The cavity size was normal. Wall thickness was   normal. Systolic function was normal. - Pulmonary arteries: Systolic pressure was within the normal   range. PA peak pressure: 24 mm Hg (S). LA 43 mm  Epic records are reviewed at length today  Assessment and Plan:  1. Persistent atrial fibrillation The patient has persistent atrial fibrillation.   S/p DCCV 07/12/18 with ERAF. Given CHF history and variable EF, best to avoid class 1C and Multaq. We dicussed therapeutic options including Tikosyn and amiodarone. Patient would prefer to start amiodarone. Discussed risks and benefits of medication and he is willing to proceed. Recent normal LFTs and TSH noted. Recent CT showed no interstitial lung disease. Will load on amiodarone 400 mg BID x1 week, decrease to 200 mg BID thereafter. Plan for DCCV in about 1 month after amio loading. Continue Xarelto 20 mg daily Continue Toprol 25 daily Encouraged lifestyle modifications including reduction/cessation of alcohol, regular daily physical activity and weight loss.  In the future if he requires surgery for his aortic aneurysm and valve, would consider MAZE. Could possibly come off amiodarone.  This patients CHA2DS2-VASc Score and unadjusted Ischemic Stroke Rate (% per year) is equal to 7.2 % stroke rate/year from a score of 5  Above score calculated as 1 point each if present  [CHF, HTN, DM, Vascular=MI/PAD/Aortic Plaque, Age if 65-74, or Male] Above score calculated as 2 points each if present [Age > 75, or Stroke/TIA/TE]   2. Obesity As above, lifestyle  modification was discussed at length including regular exercise and weight reduction. Patient agrees and is going to try and start walking again.  Body mass index is 34.34 kg/m.  3. HTN Stable, no changes today.  4. Aortic Aneurysm 5 cm on last echo. Continue present therapy with strict HTN control. Followed by Dr Servando Snare. Continue BB   5. Chronic combined systolic and diastolic CHF No signs of fluid overload. Continue BB, ARB, and aldactone.   Follow up for ECG in 1 week. Then follow up 2 weeks after for pre DCCV H&P and labs.   [facility] [address] [place], [place] [address] [phone] 07/27/2018 4:05 PM

## 2018-07-27 ENCOUNTER — Encounter (HOSPITAL_COMMUNITY): Payer: Self-pay | Admitting: Physician Assistant

## 2018-07-27 ENCOUNTER — Ambulatory Visit (HOSPITAL_COMMUNITY)
Admission: RE | Admit: 2018-07-27 | Discharge: 2018-07-27 | Disposition: A | Discharge status: Home / Self Care | Payer: Medicare HMO | Source: Ambulatory Visit | Attending: Physician Assistant | Admitting: Physician Assistant

## 2018-07-27 VITALS — BP 132/70 | HR 92 | Ht 77.0 in | Wt 289.6 lb

## 2018-07-27 DIAGNOSIS — E782 Mixed hyperlipidemia: Secondary | ICD-10-CM | POA: Diagnosis not present

## 2018-07-27 DIAGNOSIS — Z6834 Body mass index (BMI) 34.0-34.9, adult: Secondary | ICD-10-CM | POA: Diagnosis not present

## 2018-07-27 DIAGNOSIS — I712 Thoracic aortic aneurysm, without rupture: Secondary | ICD-10-CM | POA: Diagnosis not present

## 2018-07-27 DIAGNOSIS — I4819 Other persistent atrial fibrillation: Secondary | ICD-10-CM | POA: Diagnosis not present

## 2018-07-27 DIAGNOSIS — M1990 Unspecified osteoarthritis, unspecified site: Secondary | ICD-10-CM | POA: Insufficient documentation

## 2018-07-27 DIAGNOSIS — I719 Aortic aneurysm of unspecified site, without rupture: Secondary | ICD-10-CM | POA: Diagnosis not present

## 2018-07-27 DIAGNOSIS — E118 Type 2 diabetes mellitus with unspecified complications: Secondary | ICD-10-CM | POA: Insufficient documentation

## 2018-07-27 DIAGNOSIS — Z7901 Long term (current) use of anticoagulants: Secondary | ICD-10-CM | POA: Diagnosis not present

## 2018-07-27 DIAGNOSIS — I5042 Chronic combined systolic (congestive) and diastolic (congestive) heart failure: Secondary | ICD-10-CM | POA: Diagnosis not present

## 2018-07-27 DIAGNOSIS — Z87891 Personal history of nicotine dependence: Secondary | ICD-10-CM | POA: Diagnosis not present

## 2018-07-27 DIAGNOSIS — Z8249 Family history of ischemic heart disease and other diseases of the circulatory system: Secondary | ICD-10-CM | POA: Diagnosis not present

## 2018-07-27 DIAGNOSIS — E669 Obesity, unspecified: Secondary | ICD-10-CM | POA: Diagnosis not present

## 2018-07-27 DIAGNOSIS — Z79899 Other long term (current) drug therapy: Secondary | ICD-10-CM | POA: Insufficient documentation

## 2018-07-27 DIAGNOSIS — I110 Hypertensive heart disease with heart failure: Secondary | ICD-10-CM | POA: Insufficient documentation

## 2018-07-27 MED ORDER — AMIODARONE HCL 200 MG PO TABS: ORAL_TABLET | ORAL | 0 refills | Status: DC

## 2018-07-27 NOTE — Patient Instructions (Signed)
Amiodarone -- take 2 tablets twice a day for 1 week then reduce to 1 tablet twice a day for 3 weeks then will go to 1 tablet daily

## 2018-08-03 ENCOUNTER — Ambulatory Visit (HOSPITAL_COMMUNITY)
Admission: RE | Admit: 2018-08-03 | Discharge: 2018-08-03 | Disposition: A | Discharge status: Home / Self Care | Payer: Medicare HMO | Source: Ambulatory Visit | Attending: Physician Assistant | Admitting: Physician Assistant

## 2018-08-03 DIAGNOSIS — Z79899 Other long term (current) drug therapy: Secondary | ICD-10-CM | POA: Diagnosis not present

## 2018-08-03 DIAGNOSIS — Z7901 Long term (current) use of anticoagulants: Secondary | ICD-10-CM | POA: Diagnosis not present

## 2018-08-03 DIAGNOSIS — I4891 Unspecified atrial fibrillation: Secondary | ICD-10-CM | POA: Insufficient documentation

## 2018-08-03 NOTE — Progress Notes (Addendum)
Pt in for EKG today to be reviewed by Adline Peals, PA  Patient returns today for ECG following amiodarone initiation. ECG shows atrial fibrillation HR 75, QRS 98, QTc 455. Will decrease amiodarone to 200 mg BID. Plan for DCCV in 3 weeks. Patient states no missed doses of Xarelto. Will see back in 2-3 weeks for pre DCCV H&P and labs.

## 2018-08-16 ENCOUNTER — Other Ambulatory Visit (HOSPITAL_COMMUNITY): Payer: Self-pay | Admitting: Physician Assistant

## 2018-08-18 ENCOUNTER — Encounter (HOSPITAL_COMMUNITY): Payer: Self-pay | Admitting: Nurse Practitioner

## 2018-08-18 ENCOUNTER — Encounter (HOSPITAL_COMMUNITY): Payer: Self-pay | Admitting: *Deleted

## 2018-08-18 ENCOUNTER — Ambulatory Visit (HOSPITAL_COMMUNITY)
Admission: RE | Admit: 2018-08-18 | Discharge: 2018-08-18 | Disposition: A | Discharge status: Home / Self Care | Payer: Medicare HMO | Source: Ambulatory Visit | Attending: Physician Assistant | Admitting: Physician Assistant

## 2018-08-18 VITALS — BP 110/64 | Ht 77.0 in | Wt 291.8 lb

## 2018-08-18 DIAGNOSIS — E119 Type 2 diabetes mellitus without complications: Secondary | ICD-10-CM | POA: Diagnosis not present

## 2018-08-18 DIAGNOSIS — Z6834 Body mass index (BMI) 34.0-34.9, adult: Secondary | ICD-10-CM | POA: Insufficient documentation

## 2018-08-18 DIAGNOSIS — I719 Aortic aneurysm of unspecified site, without rupture: Secondary | ICD-10-CM | POA: Insufficient documentation

## 2018-08-18 DIAGNOSIS — Z87891 Personal history of nicotine dependence: Secondary | ICD-10-CM | POA: Diagnosis not present

## 2018-08-18 DIAGNOSIS — R9431 Abnormal electrocardiogram [ECG] [EKG]: Secondary | ICD-10-CM | POA: Insufficient documentation

## 2018-08-18 DIAGNOSIS — G4700 Insomnia, unspecified: Secondary | ICD-10-CM | POA: Diagnosis not present

## 2018-08-18 DIAGNOSIS — E782 Mixed hyperlipidemia: Secondary | ICD-10-CM | POA: Insufficient documentation

## 2018-08-18 DIAGNOSIS — Z8249 Family history of ischemic heart disease and other diseases of the circulatory system: Secondary | ICD-10-CM | POA: Insufficient documentation

## 2018-08-18 DIAGNOSIS — Z7901 Long term (current) use of anticoagulants: Secondary | ICD-10-CM | POA: Insufficient documentation

## 2018-08-18 DIAGNOSIS — Z79899 Other long term (current) drug therapy: Secondary | ICD-10-CM | POA: Insufficient documentation

## 2018-08-18 DIAGNOSIS — I5042 Chronic combined systolic (congestive) and diastolic (congestive) heart failure: Secondary | ICD-10-CM | POA: Diagnosis not present

## 2018-08-18 DIAGNOSIS — E669 Obesity, unspecified: Secondary | ICD-10-CM | POA: Diagnosis not present

## 2018-08-18 DIAGNOSIS — I4819 Other persistent atrial fibrillation: Secondary | ICD-10-CM | POA: Insufficient documentation

## 2018-08-18 DIAGNOSIS — I5032 Chronic diastolic (congestive) heart failure: Secondary | ICD-10-CM

## 2018-08-18 DIAGNOSIS — Z881 Allergy status to other antibiotic agents status: Secondary | ICD-10-CM | POA: Diagnosis not present

## 2018-08-18 DIAGNOSIS — I110 Hypertensive heart disease with heart failure: Secondary | ICD-10-CM | POA: Diagnosis not present

## 2018-08-18 LAB — CBC
HCT: 47.6 % (ref 39.0–52.0)
Hemoglobin: 15.5 g/dL (ref 13.0–17.0)
MCH: 29 pg (ref 26.0–34.0)
MCHC: 32.6 g/dL (ref 30.0–36.0)
MCV: 89.1 fL (ref 80.0–100.0)
Platelets: 214 10*3/uL (ref 150–400)
RBC: 5.34 MIL/uL (ref 4.22–5.81)
RDW: 12.6 % (ref 11.5–15.5)
WBC: 8.2 10*3/uL (ref 4.0–10.5)
nRBC: 0 % (ref 0.0–0.2)

## 2018-08-18 LAB — BASIC METABOLIC PANEL
Anion gap: 8 (ref 5–15)
BUN: 20 mg/dL (ref 8–23)
CO2: 21 mmol/L — ABNORMAL LOW (ref 22–32)
Calcium: 9.1 mg/dL (ref 8.9–10.3)
Chloride: 108 mmol/L (ref 98–111)
Creatinine, Ser: 0.98 mg/dL (ref 0.61–1.24)
GFR calc Af Amer: >60 mL/min (ref >60)
GFR calc non Af Amer: >60 mL/min (ref >60)
Glucose, Bld: 145 mg/dL — ABNORMAL HIGH (ref 70–99)
Potassium: 4.5 mmol/L (ref 3.5–5.1)
Sodium: 137 mmol/L (ref 135–145)

## 2018-08-18 NOTE — H&P (View-Only) (Signed)
Primary Care Physician: [name], MD Referring Physician: Dr. [name]   Clinton Black is a 73 y.o. male with a h/o persistent atrial fibrillation, thoracic aortic aneurysm, mod AI, combined systolic/diastolic CHF, HTN, DM, HLD who presents for f/u in the [facility]. The patient was initially diagnosed with atrial fibrillation 05/2018 after presenting to his PCP and ECG showed afib. He underwent DCCV on 07/12/18 but unfortunately had return of afib. While in SR, he had improvement in his SOB and exercise tolerance.    He is now back in  the afib clinic 3/6, after being loaded on  amiodarone for the last month, to be set up for cardioversion.He has been taking 200 mg bid and has not missed any of his xarelto.  Today, he denies symptoms of palpitations, chest pain, shortness of breath, orthopnea, PND, lower extremity edema, dizziness, presyncope, syncope, or neurologic sequela. The patient is tolerating medications without difficulties and is otherwise without complaint today.   Past Medical History:  Diagnosis Date  . Arthritis   . BPH (benign prostatic hyperplasia)   . Diabetes mellitus type 2 in obese (HCC)    diet controlled  . HTN (hypertension)   . Hyperlipidemia, mixed   . Insomnia   . Obesity   . Thoracic aortic aneurysm, without rupture (HCC)    4.7 cm per chest ct with contrast 11-04-17   Past Surgical History:  Procedure Laterality Date  . CARDIOVERSION N/A 07/12/2018   Procedure: CARDIOVERSION;  Surgeon: Acie Fredrickson Wonda Cheng, MD;  Location: [facility] ENDOSCOPY;  Service: Cardiovascular;  Laterality: N/A;  . COLONOSCOPY  2016  . CYST EXCISION     polynomial  . CYSTOSCOPY WITH INSERTION OF UROLIFT N/A 04/18/2018   Procedure: CYSTOSCOPY WITH INSERTION OF UROLIFT;  Surgeon: Cleon Gustin, MD;  Location: [facility];  Service: Urology;  Laterality: N/A;  . KNEE SURGERY Right 2000   arthroscopy  . TONSILLECTOMY    . widson teeth  extraction      Current Outpatient Medications  Medication Sig Dispense Refill  . amiodarone (PACERONE) 200 MG tablet Take one tablet twice a day 60 tablet 1  . atorvastatin (LIPITOR) 20 MG tablet Take 1 tablet (20 mg total) by mouth daily. (Patient taking differently: Take 20 mg by mouth at bedtime. ) 90 tablet 1  . losartan (COZAAR) 50 MG tablet TAKE 1 TABLET BY MOUTH EVERY DAY (Patient taking differently: Take 50 mg by mouth at bedtime. ) 90 tablet 1  . metoprolol succinate (TOPROL-XL) 25 MG 24 hr tablet TAKE 1 TABLET BY MOUTH EVERY DAY (Patient taking differently: Take 25 mg by mouth at bedtime. ) 90 tablet 2  . rivaroxaban (XARELTO) 20 MG TABS tablet Take 1 tablet (20 mg total) by mouth daily with supper. 90 tablet 1  . spironolactone (ALDACTONE) 25 MG tablet Take 0.5 tablets (12.5 mg total) by mouth at bedtime. 45 tablet 3  . triazolam (HALCION) 0.25 MG tablet Take 1 tablet (0.25 mg total) by mouth at bedtime as needed. (Patient taking differently: Take 0.25 mg by mouth at bedtime as needed for sleep. ) 30 tablet 3  . TURMERIC PO Take 10 mLs by mouth daily.      No current facility-administered medications for this encounter.     Allergies  Allergen Reactions  . Quinolones Other (See Comments)    Patient was warned about not using Cipro and similar antibiotics. Recent studies have raised concern that fluoroquinolone antibiotics could be associated  with an increased risk of aortic aneurysm Fluoroquinolones have non-antimicrobial properties that might jeopardise the integrity of the extracellular matrix of the vascular wall In a  propensity score matched cohort study in [place], there was a 66% increased rate of aortic aneurysm or dissection associated with oral fluoroquinolone use, compared wit    Social History   Socioeconomic History  . Marital status: Single    Spouse name: Not on file  . Number of children: Not on file  . Years of education: Not on file  . Highest education  level: Not on file  Occupational History  . Not on file  Social Needs  . Financial resource strain: Not on file  . Food insecurity:    Worry: Not on file    Inability: Not on file  . Transportation needs:    Medical: Not on file    Non-medical: Not on file  Tobacco Use  . Smoking status: Former Smoker    Packs/day: 1.50    Years: 5.00    Pack years: 7.50    Types: Cigarettes    Last attempt to quit: 06/15/1994    Years since quitting: 24.1  . Smokeless tobacco: Never Used  Substance and Sexual Activity  . Alcohol use: Yes    Alcohol/week: 14.0 standard drinks    Types: 14 Shots of liquor per week    Comment: 1 drink of liquor per day  . Drug use: Yes    Types: Marijuana    Comment: occasionally marijuana  . Sexual activity: Yes    Partners: Female    Birth control/protection: Condom    Comment: condom use most of the time but not always  Lifestyle  . Physical activity:    Days per week: Not on file    Minutes per session: Not on file  . Stress: Not on file  Relationships  . Social connections:    Talks on phone: Not on file    Gets together: Not on file    Attends religious service: Not on file    Active member of club or organization: Not on file    Attends meetings of clubs or organizations: Not on file    Relationship status: Not on file  . Intimate partner violence:    Fear of current or ex partner: Not on file    Emotionally abused: Not on file    Physically abused: Not on file    Forced sexual activity: Not on file  Other Topics Concern  . Not on file  [place] - [employer] and [employer]. married '72- 3 years/divorced; married '89- 3 years/divorced;. married '03 - 3 years/divorced. 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    Family History  Problem Relation Age of Onset  . Aneurysm Father   . Heart disease Father   . Varicose Veins Mother   . Arthritis Mother   . Macular degeneration Mother   . Cancer Neg Hx   . Colon cancer Neg Hx     . Esophageal cancer Neg Hx   . Rectal cancer Neg Hx   . Stomach cancer Neg Hx     ROS- All systems are reviewed and negative except as per the HPI above  Physical Exam: Vitals:   08/18/18 1128  BP: 110/64  Weight: 132.4 kg  Height: 6\' 5"  (1.956 m)   Wt Readings from Last 3 Encounters:  08/18/18 132.4 kg  07/27/18 131.4 kg  07/25/18 131.3 kg    Labs: Lab  Results  Component Value Date   NA 137 07/04/2018   K 4.5 07/04/2018   CL 105 07/04/2018   CO2 18 (L) 07/04/2018   GLUCOSE 144 (H) 07/04/2018   BUN 17 07/04/2018   CREATININE 0.92 07/04/2018   CALCIUM 9.3 07/04/2018   No results found for: INR Lab Results  Component Value Date   CHOL 110 06/01/2018   HDL 34.30 (L) 06/01/2018   LDLCALC 48 06/01/2018   TRIG 137.0 06/01/2018     GEN- The patient is well appearing, alert and oriented x 3 today.   Head- normocephalic, atraumatic Eyes-  Sclera clear, conjunctiva pink Ears- hearing intact Oropharynx- clear Neck- supple, no JVP Lymph- no cervical lymphadenopathy Lungs- Clear to ausculation bilaterally, normal work of breathing Heart- Regular rate and rhythm, no murmurs, rubs or gallops, PMI not laterally displaced GI- soft, NT, ND, + BS Extremities- no clubbing, cyanosis, or edema MS- no significant deformity or atrophy Skin- no rash or lesion Psych- euthymic mood, full affect Neuro- strength and sensation are intact  EKG-afib at 64 bpm, qrs int 106 ms, qtc 422 ms Echo-Study Conclusions  - Left ventricle: The cavity size was normal. There was moderate   basal hypertrophy of the septum with otherwise mild concentric   hypertrophy. Systolic function was normal. The estimated ejection   fraction was in the range of 50% to 55%. Wall motion was normal;   there were no regional wall motion abnormalities. Doppler   parameters are consistent with abnormal left ventricular   relaxation (grade 1 diastolic dysfunction). - Aortic valve: Transvalvular velocity was  within the normal range.   There was no stenosis. There was moderate regurgitation. - Aorta: Ascending aortic diameter: 50 mm (S). 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 - Mitral valve: Transvalvular velocity was within the normal range.   There was no evidence for stenosis. There was trivial   regurgitation. - Left atrium: The atrium was moderately dilated. - Right ventricle: The cavity size was normal. Wall thickness was   normal. Systolic function was normal. - Pulmonary arteries: Systolic pressure was within the normal   range. PA peak pressure: 24 mm Hg (S).  Impressions:  - Compared with the echo 05/2017, the ascending aorta aneurysm has   increased from 4.7 cm to 5.0 cm.    Assessment and Plan: 1. Persistent afib Has been loading on amiodarone 200  bid for the last month He can now be scheduled for DCCV He has had this before, no questions re procedure He is rate controlled Continue  amiodaorne 200 mg bid until seen back one week after cardioversion, will then lower to one daily Continue  metoprolol succinate 25 mg qd bmet,cbc  2.CHA2DS2VASc score of 5 States no missed doses of xarelto x 3 weeks  3. HTN Stable   4, Aortic Aneurysm 5 cm on last echio Per Dr. Servando Snare  5. Chronic combined systolic and diastolic HF  No signs of fluid overload Continue ARB, BB, aldactone  F/u with R. Fenton, PA after cardioversion  [employer]. , [facility] [address] [place], [place] [address] [phone]

## 2018-08-18 NOTE — Patient Instructions (Addendum)
    [facility] [address] [place], [place]  [address] Phone:  [phone]   Fax:  [phone]   August 18, 2018  MRN: [id number]  Clinton Black [address] [place] [address]  Dear Mr. Clinton, Black are scheduled for a Cardioversion / TEE Cardioversion on 08/22/2018 with Dr. Oval Linsey. Please arrive at [facility] at 12:30 P.M  on the day of your procedure.  DIET:            A) Nothing to eat or drink after midnight except your medications with a                                        sip of water.     MAKE SURE YOU TAKE YOUR XARELTO.  1. Must have a responsible person to drive you home.  2. Bring a current list of your medications and current insurance cards.       * Special note: Every effort is made to have your procedure done on time. Occasionally there are emergencies that present themselves at the hospital that may cause delays. Please be patient if a delay does occur.  If you have any questions after you get home, please call the office at [phone].   Otoe Ruta, [phone]

## 2018-08-18 NOTE — Progress Notes (Signed)
Primary Care Physician: [name], MD Referring Physician: Dr. [name]   Clinton Black is a 73 y.o. male with a h/o persistent atrial fibrillation, thoracic aortic aneurysm, mod AI, combined systolic/diastolic CHF, HTN, DM, HLD who presents for f/u in the [facility]. The patient was initially diagnosed with atrial fibrillation 05/2018 after presenting to his PCP and ECG showed afib. He underwent DCCV on 07/12/18 but unfortunately had return of afib. While in SR, he had improvement in his SOB and exercise tolerance.    He is now back in  the afib clinic 3/6, after being loaded on  amiodarone for the last month, to be set up for cardioversion.He has been taking 200 mg bid and has not missed any of his xarelto.  Today, he denies symptoms of palpitations, chest pain, shortness of breath, orthopnea, PND, lower extremity edema, dizziness, presyncope, syncope, or neurologic sequela. The patient is tolerating medications without difficulties and is otherwise without complaint today.   Past Medical History:  Diagnosis Date  . Arthritis   . BPH (benign prostatic hyperplasia)   . Diabetes mellitus type 2 in obese (HCC)    diet controlled  . HTN (hypertension)   . Hyperlipidemia, mixed   . Insomnia   . Obesity   . Thoracic aortic aneurysm, without rupture (HCC)    4.7 cm per chest ct with contrast 11-04-17   Past Surgical History:  Procedure Laterality Date  . CARDIOVERSION N/A 07/12/2018   Procedure: CARDIOVERSION;  Surgeon: Acie Fredrickson Wonda Cheng, MD;  Location: [facility] ENDOSCOPY;  Service: Cardiovascular;  Laterality: N/A;  . COLONOSCOPY  2016  . CYST EXCISION     polynomial  . CYSTOSCOPY WITH INSERTION OF UROLIFT N/A 04/18/2018   Procedure: CYSTOSCOPY WITH INSERTION OF UROLIFT;  Surgeon: Cleon Gustin, MD;  Location: [facility];  Service: Urology;  Laterality: N/A;  . KNEE SURGERY Right 2000   arthroscopy  . TONSILLECTOMY    . widson teeth  extraction      Current Outpatient Medications  Medication Sig Dispense Refill  . amiodarone (PACERONE) 200 MG tablet Take one tablet twice a day 60 tablet 1  . atorvastatin (LIPITOR) 20 MG tablet Take 1 tablet (20 mg total) by mouth daily. (Patient taking differently: Take 20 mg by mouth at bedtime. ) 90 tablet 1  . losartan (COZAAR) 50 MG tablet TAKE 1 TABLET BY MOUTH EVERY DAY (Patient taking differently: Take 50 mg by mouth at bedtime. ) 90 tablet 1  . metoprolol succinate (TOPROL-XL) 25 MG 24 hr tablet TAKE 1 TABLET BY MOUTH EVERY DAY (Patient taking differently: Take 25 mg by mouth at bedtime. ) 90 tablet 2  . rivaroxaban (XARELTO) 20 MG TABS tablet Take 1 tablet (20 mg total) by mouth daily with supper. 90 tablet 1  . spironolactone (ALDACTONE) 25 MG tablet Take 0.5 tablets (12.5 mg total) by mouth at bedtime. 45 tablet 3  . triazolam (HALCION) 0.25 MG tablet Take 1 tablet (0.25 mg total) by mouth at bedtime as needed. (Patient taking differently: Take 0.25 mg by mouth at bedtime as needed for sleep. ) 30 tablet 3  . TURMERIC PO Take 10 mLs by mouth daily.      No current facility-administered medications for this encounter.     Allergies  Allergen Reactions  . Quinolones Other (See Comments)    Patient was warned about not using Cipro and similar antibiotics. Recent studies have raised concern that fluoroquinolone antibiotics could be associated  with an increased risk of aortic aneurysm Fluoroquinolones have non-antimicrobial properties that might jeopardise the integrity of the extracellular matrix of the vascular wall In a  propensity score matched cohort study in [place], there was a 66% increased rate of aortic aneurysm or dissection associated with oral fluoroquinolone use, compared wit    Social History   Socioeconomic History  . Marital status: Single    Spouse name: Not on file  . Number of children: Not on file  . Years of education: Not on file  . Highest education  level: Not on file  Occupational History  . Not on file  Social Needs  . Financial resource strain: Not on file  . Food insecurity:    Worry: Not on file    Inability: Not on file  . Transportation needs:    Medical: Not on file    Non-medical: Not on file  Tobacco Use  . Smoking status: Former Smoker    Packs/day: 1.50    Years: 5.00    Pack years: 7.50    Types: Cigarettes    Last attempt to quit: 06/15/1994    Years since quitting: 24.1  . Smokeless tobacco: Never Used  Substance and Sexual Activity  . Alcohol use: Yes    Alcohol/week: 14.0 standard drinks    Types: 14 Shots of liquor per week    Comment: 1 drink of liquor per day  . Drug use: Yes    Types: Marijuana    Comment: occasionally marijuana  . Sexual activity: Yes    Partners: Female    Birth control/protection: Condom    Comment: condom use most of the time but not always  Lifestyle  . Physical activity:    Days per week: Not on file    Minutes per session: Not on file  . Stress: Not on file  Relationships  . Social connections:    Talks on phone: Not on file    Gets together: Not on file    Attends religious service: Not on file    Active member of club or organization: Not on file    Attends meetings of clubs or organizations: Not on file    Relationship status: Not on file  . Intimate partner violence:    Fear of current or ex partner: Not on file    Emotionally abused: Not on file    Physically abused: Not on file    Forced sexual activity: Not on file  Other Topics Concern  . Not on file  [place] - [employer]. married '72- 3 years/divorced; married '89- 3 years/divorced;. married '03 - 3 years/divorced. 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    Family History  Problem Relation Age of Onset  . Aneurysm Father   . Heart disease Father   . Varicose Veins Mother   . Arthritis Mother   . Macular degeneration Mother   . Cancer Neg Hx   . Colon cancer Neg Hx     . Esophageal cancer Neg Hx   . Rectal cancer Neg Hx   . Stomach cancer Neg Hx     ROS- All systems are reviewed and negative except as per the HPI above  Physical Exam: Vitals:   08/18/18 1128  BP: 110/64  Weight: 132.4 kg  Height: 6\' 5"  (1.956 m)   Wt Readings from Last 3 Encounters:  08/18/18 132.4 kg  07/27/18 131.4 kg  07/25/18 131.3 kg    Labs: Lab  Results  Component Value Date   NA 137 07/04/2018   K 4.5 07/04/2018   CL 105 07/04/2018   CO2 18 (L) 07/04/2018   GLUCOSE 144 (H) 07/04/2018   BUN 17 07/04/2018   CREATININE 0.92 07/04/2018   CALCIUM 9.3 07/04/2018   No results found for: INR Lab Results  Component Value Date   CHOL 110 06/01/2018   HDL 34.30 (L) 06/01/2018   LDLCALC 48 06/01/2018   TRIG 137.0 06/01/2018     GEN- The patient is well appearing, alert and oriented x 3 today.   Head- normocephalic, atraumatic Eyes-  Sclera clear, conjunctiva pink Ears- hearing intact Oropharynx- clear Neck- supple, no JVP Lymph- no cervical lymphadenopathy Lungs- Clear to ausculation bilaterally, normal work of breathing Heart- Regular rate and rhythm, no murmurs, rubs or gallops, PMI not laterally displaced GI- soft, NT, ND, + BS Extremities- no clubbing, cyanosis, or edema MS- no significant deformity or atrophy Skin- no rash or lesion Psych- euthymic mood, full affect Neuro- strength and sensation are intact  EKG-afib at 64 bpm, qrs int 106 ms, qtc 422 ms Echo-Study Conclusions  - Left ventricle: The cavity size was normal. There was moderate   basal hypertrophy of the septum with otherwise mild concentric   hypertrophy. Systolic function was normal. The estimated ejection   fraction was in the range of 50% to 55%. Wall motion was normal;   there were no regional wall motion abnormalities. Doppler   parameters are consistent with abnormal left ventricular   relaxation (grade 1 diastolic dysfunction). - Aortic valve: Transvalvular velocity was  within the normal range.   There was no stenosis. There was moderate regurgitation. - Aorta: Ascending aortic diameter: 50 mm (S). 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 - Mitral valve: Transvalvular velocity was within the normal range.   There was no evidence for stenosis. There was trivial   regurgitation. - Left atrium: The atrium was moderately dilated. - Right ventricle: The cavity size was normal. Wall thickness was   normal. Systolic function was normal. - Pulmonary arteries: Systolic pressure was within the normal   range. PA peak pressure: 24 mm Hg (S).  Impressions:  - Compared with the echo 05/2017, the ascending aorta aneurysm has   increased from 4.7 cm to 5.0 cm.    Assessment and Plan: 1. Persistent afib Has been loading on amiodarone 200  bid for the last month He can now be scheduled for DCCV He has had this before, no questions re procedure He is rate controlled Continue  amiodaorne 200 mg bid until seen back one week after cardioversion, will then lower to one daily Continue  metoprolol succinate 25 mg qd bmet,cbc  2.CHA2DS2VASc score of 5 States no missed doses of xarelto x 3 weeks  3. HTN Stable   4, Aortic Aneurysm 5 cm on last echio Per Dr. Servando Snare  5. Chronic combined systolic and diastolic HF  No signs of fluid overload Continue ARB, BB, aldactone  F/u with R. Fenton, PA after cardioversion  [employer]. , [facility] [address] [place], [place] [address] [phone]

## 2018-08-19 ENCOUNTER — Other Ambulatory Visit (HOSPITAL_COMMUNITY): Payer: Self-pay | Admitting: Nurse Practitioner

## 2018-08-22 ENCOUNTER — Ambulatory Visit (HOSPITAL_COMMUNITY): Payer: Medicare HMO | Admitting: Anesthesiology

## 2018-08-22 ENCOUNTER — Encounter (HOSPITAL_COMMUNITY): Payer: Self-pay | Admitting: *Deleted

## 2018-08-22 ENCOUNTER — Encounter (HOSPITAL_COMMUNITY)
Admission: RE | Disposition: A | Discharge status: Home / Self Care | Payer: Self-pay | Source: Ambulatory Visit | Attending: Cardiovascular Disease

## 2018-08-22 ENCOUNTER — Ambulatory Visit (HOSPITAL_COMMUNITY)
Admission: RE | Admit: 2018-08-22 | Discharge: 2018-08-22 | Disposition: A | Discharge status: Home / Self Care | Payer: Medicare HMO | Source: Ambulatory Visit | Attending: Cardiovascular Disease | Admitting: Cardiovascular Disease

## 2018-08-22 ENCOUNTER — Other Ambulatory Visit: Payer: Self-pay

## 2018-08-22 DIAGNOSIS — I110 Hypertensive heart disease with heart failure: Secondary | ICD-10-CM | POA: Diagnosis not present

## 2018-08-22 DIAGNOSIS — I4819 Other persistent atrial fibrillation: Secondary | ICD-10-CM | POA: Diagnosis not present

## 2018-08-22 DIAGNOSIS — I4891 Unspecified atrial fibrillation: Secondary | ICD-10-CM | POA: Diagnosis not present

## 2018-08-22 DIAGNOSIS — Z7901 Long term (current) use of anticoagulants: Secondary | ICD-10-CM | POA: Diagnosis not present

## 2018-08-22 DIAGNOSIS — I712 Thoracic aortic aneurysm, without rupture: Secondary | ICD-10-CM | POA: Insufficient documentation

## 2018-08-22 DIAGNOSIS — Z79899 Other long term (current) drug therapy: Secondary | ICD-10-CM | POA: Insufficient documentation

## 2018-08-22 DIAGNOSIS — E119 Type 2 diabetes mellitus without complications: Secondary | ICD-10-CM | POA: Diagnosis not present

## 2018-08-22 DIAGNOSIS — E785 Hyperlipidemia, unspecified: Secondary | ICD-10-CM | POA: Insufficient documentation

## 2018-08-22 DIAGNOSIS — E669 Obesity, unspecified: Secondary | ICD-10-CM | POA: Insufficient documentation

## 2018-08-22 DIAGNOSIS — N400 Enlarged prostate without lower urinary tract symptoms: Secondary | ICD-10-CM | POA: Insufficient documentation

## 2018-08-22 DIAGNOSIS — R6889 Other general symptoms and signs: Secondary | ICD-10-CM | POA: Diagnosis not present

## 2018-08-22 DIAGNOSIS — I5042 Chronic combined systolic (congestive) and diastolic (congestive) heart failure: Secondary | ICD-10-CM | POA: Insufficient documentation

## 2018-08-22 DIAGNOSIS — I5032 Chronic diastolic (congestive) heart failure: Secondary | ICD-10-CM | POA: Diagnosis not present

## 2018-08-22 DIAGNOSIS — Z87891 Personal history of nicotine dependence: Secondary | ICD-10-CM | POA: Diagnosis not present

## 2018-08-22 HISTORY — PX: CARDIOVERSION: SHX1299

## 2018-08-22 SURGERY — CARDIOVERSION
Anesthesia: General

## 2018-08-22 MED ORDER — LIDOCAINE HCL (CARDIAC) PF 100 MG/5ML IV SOSY
PREFILLED_SYRINGE | INTRAVENOUS | Status: DC | PRN
  Administered 2018-08-22: 20 mg via INTRAVENOUS

## 2018-08-22 MED ORDER — PROPOFOL 10 MG/ML IV BOLUS
INTRAVENOUS | Status: DC | PRN
  Administered 2018-08-22 (×2): 30 mg via INTRAVENOUS
  Administered 2018-08-22: 60 mg via INTRAVENOUS

## 2018-08-22 MED ORDER — SODIUM CHLORIDE 0.9 % IV SOLN
INTRAVENOUS | Status: DC | PRN
  Administered 2018-08-22: 13:00:00 via INTRAVENOUS

## 2018-08-22 NOTE — CV Procedure (Signed)
Electrical Cardioversion Procedure Note MEGAN HAYDUK [id number] Nov 28, 1945  Procedure: Electrical Cardioversion Indications:  Atrial Fibrillation  Procedure Details Consent: Risks of procedure as well as the alternatives and risks of each were explained to the (patient/caregiver).  Consent for procedure obtained. Time Out: Verified patient identification, verified procedure, site/side was marked, verified correct patient position, special equipment/implants available, medications/allergies/relevent history reviewed, required imaging and test results available.  Performed  Patient placed on cardiac monitor, pulse oximetry, supplemental oxygen as necessary.  Sedation given: propofol Pacer pads placed anterior and posterior chest.  Cardioverted 3 time(s).  Cardioverted at 200J x3.  Pressure applied to the chest for the second and third attempts.  Each attempt was unsuccessful.  Evaluation Findings: Post procedure EKG shows: Atrial Fibrillation Complications: None Patient did tolerate procedure well.   Skeet Latch, MD 08/22/2018, 1:17 PM

## 2018-08-22 NOTE — Anesthesia Preprocedure Evaluation (Addendum)
Anesthesia Evaluation  Patient identified by MRN, date of birth, ID band Patient awake    Reviewed: Allergy & Precautions, NPO status , Patient's Chart, lab work & pertinent test results, reviewed documented beta blocker date and time   History of Anesthesia Complications Negative for: history of anesthetic complications  Airway Mallampati: II  TM Distance: >3 FB     Dental   Pulmonary former smoker,    breath sounds clear to auscultation       Cardiovascular hypertension, Pt. on home beta blockers and Pt. on medications +CHF  + dysrhythmias Atrial Fibrillation  Rhythm:Irregular Rate:Normal   Thoracic aortic aneurysm  '19 TTE - Moderate basal hypertrophy of the septum with otherwise mild concentric hypertrophy. EF 50% to 55%. Grade 1 diastolic dysfunction. Moderate AI. Ascending aortic diameter: 76mm. Trivial MR. LA was moderately dilated.    Neuro/Psych  Insomnia negative neurological ROS     GI/Hepatic negative GI ROS, Neg liver ROS,   Endo/Other  diabetes, Type 2 Obesity   Renal/GU negative Renal ROS    BPH     Musculoskeletal  (+) Arthritis ,   Abdominal   Peds  Hematology negative hematology ROS (+)   Anesthesia Other Findings   Reproductive/Obstetrics                            Anesthesia Physical Anesthesia Plan  ASA: III  Anesthesia Plan: General   Post-op Pain Management:    Induction: Intravenous  PONV Risk Score and Plan: 2 and Treatment may vary due to age or medical condition and Propofol infusion  Airway Management Planned: Mask and Natural Airway  Additional Equipment: None  Intra-op Plan:   Post-operative Plan:   Informed Consent: I have reviewed the patients History and Physical, chart, labs and discussed the procedure including the risks, benefits and alternatives for the proposed anesthesia with the patient or authorized representative who has  indicated his/her understanding and acceptance.       Plan Discussed with: CRNA and Anesthesiologist  Anesthesia Plan Comments:        Anesthesia Quick Evaluation

## 2018-08-22 NOTE — Discharge Instructions (Signed)
Electrical Cardioversion, Care After °This sheet gives you information about how to care for yourself after your procedure. Your health care provider may also give you more specific instructions. If you have problems or questions, contact your health care provider. °What can I expect after the procedure? °After the procedure, it is common to have: °· Some redness on the skin where the shocks were given. °Follow these instructions at home: ° °· Do not drive for 24 hours if you were given a medicine to help you relax (sedative). °· Take over-the-counter and prescription medicines only as told by your health care provider. °· Ask your health care provider how to check your pulse. Check it often. °· Rest for 48 hours after the procedure or as told by your health care provider. °· Avoid or limit your caffeine use as told by your health care provider. °Contact a health care provider if: °· You feel like your heart is beating too quickly or your pulse is not regular. °· You have a serious muscle cramp that does not go away. °Get help right away if: ° °· You have discomfort in your chest. °· You are dizzy or you feel faint. °· You have trouble breathing or you are short of breath. °· Your speech is slurred. °· You have trouble moving an arm or leg on one side of your body. °· Your fingers or toes turn cold or blue. °This information is not intended to replace advice given to you by your health care provider. Make sure you discuss any questions you have with your health care provider. °Document Released: 03/22/2013 Document Revised: 01/03/2016 Document Reviewed: 12/06/2015 °Elsevier Interactive Patient Education © 2019 [employer] ° °

## 2018-08-22 NOTE — Transfer of Care (Signed)
Immediate Anesthesia Transfer of Care Note  Patient: Clinton Black  Procedure(s) Performed: CARDIOVERSION (N/A )  Patient Location: Endoscopy Unit  Anesthesia Type:General  Level of Consciousness: drowsy and patient cooperative  Airway & Oxygen Therapy: Patient Spontanous Breathing and Patient connected to nasal cannula oxygen  Post-op Assessment: Report given to RN and Post -op Vital signs reviewed and stable  Post vital signs: Reviewed and stable  Last Vitals:  Vitals Value Taken Time  BP    Temp    Pulse    Resp    SpO2      Last Pain:  Vitals:   08/22/18 1159  PainSc: 0-No pain         Complications: No apparent anesthesia complications

## 2018-08-22 NOTE — Interval H&P Note (Signed)
History and Physical Interval Note:  08/22/2018 1:07 PM  Clinton Black  has presented today for surgery, with the diagnosis of a fib.  The various methods of treatment have been discussed with the patient and family. After consideration of risks, benefits and other options for treatment, the patient has consented to  Procedure(s): CARDIOVERSION (N/A) as a surgical intervention.  The patient's history has been reviewed, patient examined, no change in status, stable for surgery.  I have reviewed the patient's chart and labs.  Questions were answered to the patient's satisfaction.     Skeet Latch, MD

## 2018-08-22 NOTE — Anesthesia Postprocedure Evaluation (Signed)
Anesthesia Post Note  Patient: [name]  Procedure(s) Performed: CARDIOVERSION (N/A )     Patient location during evaluation: PACU Anesthesia Type: General Level of consciousness: awake and alert Pain management: pain level controlled Vital Signs Assessment: post-procedure vital signs reviewed and stable Respiratory status: spontaneous breathing, nonlabored ventilation, respiratory function stable and patient connected to nasal cannula oxygen Cardiovascular status: blood pressure returned to baseline and stable Postop Assessment: no apparent nausea or vomiting Anesthetic complications: no    Last Vitals:  Vitals:   08/22/18 1335 08/22/18 1340  BP: 122/69 126/63  Pulse: 72 (!) 51  Resp: 10 15  Temp:    SpO2: 95% 96%    Last Pain:  Vitals:   08/22/18 1340  TempSrc:   PainSc: 0-No pain                 Tiajuana Amass

## 2018-08-24 ENCOUNTER — Encounter (HOSPITAL_COMMUNITY): Payer: Self-pay | Admitting: Cardiovascular Disease

## 2018-08-26 ENCOUNTER — Other Ambulatory Visit: Payer: Self-pay | Admitting: Cardiology

## 2018-08-29 ENCOUNTER — Other Ambulatory Visit: Payer: Self-pay

## 2018-08-29 ENCOUNTER — Ambulatory Visit (HOSPITAL_COMMUNITY)
Admission: RE | Admit: 2018-08-29 | Discharge: 2018-08-29 | Disposition: A | Discharge status: Home / Self Care | Payer: Medicare HMO | Source: Ambulatory Visit | Attending: Nurse Practitioner | Admitting: Nurse Practitioner

## 2018-08-29 ENCOUNTER — Encounter (HOSPITAL_COMMUNITY): Payer: Self-pay | Admitting: Physician Assistant

## 2018-08-29 ENCOUNTER — Other Ambulatory Visit: Payer: Self-pay | Admitting: Cardiology

## 2018-08-29 VITALS — BP 124/66 | HR 80 | Ht 77.0 in | Wt 294.0 lb

## 2018-08-29 DIAGNOSIS — Z8249 Family history of ischemic heart disease and other diseases of the circulatory system: Secondary | ICD-10-CM | POA: Insufficient documentation

## 2018-08-29 DIAGNOSIS — R0602 Shortness of breath: Secondary | ICD-10-CM | POA: Insufficient documentation

## 2018-08-29 DIAGNOSIS — I719 Aortic aneurysm of unspecified site, without rupture: Secondary | ICD-10-CM | POA: Diagnosis not present

## 2018-08-29 DIAGNOSIS — E785 Hyperlipidemia, unspecified: Secondary | ICD-10-CM | POA: Insufficient documentation

## 2018-08-29 DIAGNOSIS — R0683 Snoring: Secondary | ICD-10-CM | POA: Insufficient documentation

## 2018-08-29 DIAGNOSIS — Z79899 Other long term (current) drug therapy: Secondary | ICD-10-CM | POA: Diagnosis not present

## 2018-08-29 DIAGNOSIS — E118 Type 2 diabetes mellitus with unspecified complications: Secondary | ICD-10-CM | POA: Insufficient documentation

## 2018-08-29 DIAGNOSIS — I4819 Other persistent atrial fibrillation: Secondary | ICD-10-CM | POA: Insufficient documentation

## 2018-08-29 DIAGNOSIS — Z6834 Body mass index (BMI) 34.0-34.9, adult: Secondary | ICD-10-CM | POA: Diagnosis not present

## 2018-08-29 DIAGNOSIS — M1990 Unspecified osteoarthritis, unspecified site: Secondary | ICD-10-CM | POA: Insufficient documentation

## 2018-08-29 DIAGNOSIS — Z7901 Long term (current) use of anticoagulants: Secondary | ICD-10-CM | POA: Insufficient documentation

## 2018-08-29 DIAGNOSIS — E669 Obesity, unspecified: Secondary | ICD-10-CM | POA: Diagnosis not present

## 2018-08-29 DIAGNOSIS — I5042 Chronic combined systolic (congestive) and diastolic (congestive) heart failure: Secondary | ICD-10-CM | POA: Diagnosis not present

## 2018-08-29 DIAGNOSIS — I110 Hypertensive heart disease with heart failure: Secondary | ICD-10-CM | POA: Insufficient documentation

## 2018-08-29 DIAGNOSIS — F329 Major depressive disorder, single episode, unspecified: Secondary | ICD-10-CM | POA: Insufficient documentation

## 2018-08-29 DIAGNOSIS — Z87891 Personal history of nicotine dependence: Secondary | ICD-10-CM | POA: Insufficient documentation

## 2018-08-29 NOTE — Patient Instructions (Signed)
Stop amiodarone

## 2018-08-29 NOTE — Progress Notes (Signed)
Primary Care Physician: [name], MD Referring Physician: Dr. [name]   Clinton Black is a 73 y.o. male with a h/o persistent atrial fibrillation, thoracic aortic aneurysm, mod AI, combined systolic/diastolic CHF, HTN, DM, HLD who presents for f/u in the [facility]. The patient was initially diagnosed with atrial fibrillation 05/2018 after presenting to his PCP and ECG showed afib. He underwent DCCV on 07/12/18 but unfortunately had return of afib. While in SR, he had improvement in his SOB and exercise tolerance.    Patient is s/p DCCV 08/22/18 which was unsuccessful at restoring SR. Patient continues to have SOB and decreased exercise tolerance. He does admit to daytime somnolence and snoring.   Today, he denies symptoms of palpitations, chest pain, orthopnea, PND, lower extremity edema, dizziness, presyncope, syncope, or neurologic sequela. The patient is tolerating medications without difficulties and is otherwise without complaint today.   Past Medical History:  Diagnosis Date  . Arthritis   . BPH (benign prostatic hyperplasia)   . Diabetes mellitus type 2 in obese (HCC)    diet controlled  . HTN (hypertension)   . Hyperlipidemia, mixed   . Insomnia   . Obesity   . Thoracic aortic aneurysm, without rupture (HCC)    4.7 cm per chest ct with contrast 11-04-17   Past Surgical History:  Procedure Laterality Date  . CARDIOVERSION N/A 07/12/2018   Procedure: CARDIOVERSION;  Surgeon: Acie Fredrickson Wonda Cheng, MD;  Location: [facility] ENDOSCOPY;  Service: Cardiovascular;  Laterality: N/A;  . CARDIOVERSION N/A 08/22/2018   Procedure: CARDIOVERSION;  Surgeon: Skeet Latch, MD;  Location: [place];  Service: Cardiovascular;  Laterality: N/A;  . COLONOSCOPY  2016  . CYST EXCISION     polynomial  . CYSTOSCOPY WITH INSERTION OF UROLIFT N/A 04/18/2018   Procedure: CYSTOSCOPY WITH INSERTION OF UROLIFT;  Surgeon: Cleon Gustin, MD;  Location: [facility];  Service: Urology;  Laterality: N/A;  . KNEE SURGERY Right 2000   arthroscopy  . TONSILLECTOMY    . widson teeth extraction      Current Outpatient Medications  Medication Sig Dispense Refill  . atorvastatin (LIPITOR) 20 MG tablet Take 1 tablet (20 mg total) by mouth daily. (Patient taking differently: Take 20 mg by mouth every evening. ) 90 tablet 1  . Lidocaine 4 % PTCH Apply 1 patch topically daily as needed (pain).    Marland Kitchen losartan (COZAAR) 50 MG tablet Take 1 tablet (50 mg total) by mouth daily. 90 tablet 1  . metoprolol succinate (TOPROL-XL) 25 MG 24 hr tablet TAKE 1 TABLET BY MOUTH EVERY DAY (Patient taking differently: Take 25 mg by mouth every evening. ) 90 tablet 2  . rivaroxaban (XARELTO) 20 MG TABS tablet Take 1 tablet (20 mg total) by mouth daily with supper. 90 tablet 1  . spironolactone (ALDACTONE) 25 MG tablet Take 0.5 tablets (12.5 mg total) by mouth at bedtime. 45 tablet 3  . Tetrahydrozoline HCl (VISINE OP) Place 1 drop into both eyes daily as needed (dry eyes).    . triazolam (HALCION) 0.25 MG tablet Take 1 tablet (0.25 mg total) by mouth at bedtime as needed. (Patient taking differently: Take 0.25 mg by mouth at bedtime as needed for sleep. ) 30 tablet 3  . TURMERIC PO Take 10 mLs by mouth every evening.     Marland Kitchen amiodarone (PACERONE) 200 MG tablet Take 200 mg by mouth daily.     No current facility-administered medications for this encounter.  Allergies  Allergen Reactions  . Quinolones Other (See Comments)    Patient was warned about not using Cipro and similar antibiotics. Recent studies have raised concern that fluoroquinolone antibiotics could be associated with an increased risk of aortic aneurysm Fluoroquinolones have non-antimicrobial properties that might jeopardise the integrity of the extracellular matrix of the vascular wall In a  propensity score matched cohort study in [place], there was a 66% increased rate of aortic aneurysm or dissection  associated with oral fluoroquinolone use, compared wit    Social History   Socioeconomic History  . Marital status: Single    Spouse name: Not on file  . Number of children: Not on file  . Years of education: Not on file  . Highest education level: Not on file  Occupational History  . Not on file  Social Needs  . Financial resource strain: Not on file  . Food insecurity:    Worry: Not on file    Inability: Not on file  . Transportation needs:    Medical: Not on file    Non-medical: Not on file  Tobacco Use  . Smoking status: Former Smoker    Packs/day: 1.50    Years: 5.00    Pack years: 7.50    Types: Cigarettes    Last attempt to quit: 06/15/1994    Years since quitting: 24.2  . Smokeless tobacco: Never Used  Substance and Sexual Activity  . Alcohol use: Yes    Alcohol/week: 14.0 standard drinks    Types: 14 Shots of liquor per week    Comment: 1 drink of liquor per day  . Drug use: Yes    Types: Marijuana    Comment: occasionally marijuana  . Sexual activity: Yes    Partners: Female    Birth control/protection: Condom    Comment: condom use most of the time but not always  Lifestyle  . Physical activity:    Days per week: Not on file    Minutes per session: Not on file  . Stress: Not on file  Relationships  . Social connections:    Talks on phone: Not on file    Gets together: Not on file    Attends religious service: Not on file    Active member of club or organization: Not on file    Attends meetings of clubs or organizations: Not on file    Relationship status: Not on file  . Intimate partner violence:    Fear of current or ex partner: Not on file    Emotionally abused: Not on file    Physically abused: Not on file    Forced sexual activity: Not on file  Other Topics Concern  . Not on file  P[employer]ow - [employer]. married '72- 3 years/divorced; married '89- 3 years/divorced;. married '03 - 3 years/divorced. No  children. Work - Chief Operating Officer.    Family History  Problem Relation Age of Onset  . Aneurysm Father   . Heart disease Father   . Varicose Veins Mother   . Arthritis Mother   . Macular degeneration Mother   . Cancer Neg Hx   . Colon cancer Neg Hx   . Esophageal cancer Neg Hx   . Rectal cancer Neg Hx   . Stomach cancer Neg Hx     ROS- All systems are reviewed and negative except as per the HPI above  Physical Exam: Vitals:   08/29/18 1424  BP: 124/66  Pulse:  80  Weight: 133.4 kg  Height: 6\' 5"  (1.956 m)   Wt Readings from Last 3 Encounters:  08/29/18 133.4 kg  08/18/18 132.4 kg  07/27/18 131.4 kg    Labs: Lab Results  Component Value Date   NA 137 08/18/2018   K 4.5 08/18/2018   CL 108 08/18/2018   CO2 21 (L) 08/18/2018   GLUCOSE 145 (H) 08/18/2018   BUN 20 08/18/2018   CREATININE 0.98 08/18/2018   CALCIUM 9.1 08/18/2018   No results found for: INR Lab Results  Component Value Date   CHOL 110 06/01/2018   HDL 34.30 (L) 06/01/2018   LDLCALC 48 06/01/2018   TRIG 137.0 06/01/2018    GEN- The patient is well appearing obese male, alert and oriented x 3 today.   HEENT-head normocephalic, atraumatic, sclera clear, conjunctiva pink, hearing intact, trachea midline. Lungs- Clear to ausculation bilaterally, normal work of breathing Heart- irregular rate and rhythm, no murmurs, rubs or gallops  GI- soft, NT, ND, + BS Extremities- no clubbing, cyanosis, or edema MS- no significant deformity or atrophy Skin- no rash or lesion Psych- euthymic mood, full affect Neuro- strength and sensation are intact   EKG-afib HR 80, QRS 92, QTc 408  Echo-Study Conclusions  - Left ventricle: The cavity size was normal. There was moderate   basal hypertrophy of the septum with otherwise mild concentric   hypertrophy. Systolic function was normal. The estimated ejection   fraction was in the range of 50% to 55%. Wall motion was normal;   there were no regional wall motion  abnormalities. Doppler   parameters are consistent with abnormal left ventricular   relaxation (grade 1 diastolic dysfunction). - Aortic valve: Transvalvular velocity was within the normal range.   There was no stenosis. There was moderate regurgitation. - Aorta: Ascending aortic diameter: 50 mm (S). 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 - Mitral valve: Transvalvular velocity was within the normal range.   There was no evidence for stenosis. There was trivial   regurgitation. - Left atrium: The atrium was moderately dilated. - Right ventricle: The cavity size was normal. Wall thickness was   normal. Systolic function was normal. - Pulmonary arteries: Systolic pressure was within the normal   range. PA peak pressure: 24 mm Hg (S).  Impressions:  - Compared with the echo 05/2017, the ascending aorta aneurysm has   increased from 4.7 cm to 5.0 cm.   Assessment and Plan: 1. Persistent afib S/p unsuccessful DCCV after loading on amiodarone.  We discussed therapeutic options moving forward and patient prefers to stop amiodarone and pursue rate control for now. We did discuss possibility of Tikosyn once amiodarone has washed out for three months as well as MAZE when/if valve surgery is indicated.  Stop amiodarone Continue Toprol 25 mg daily  Continue Xarelto 20 mg daily  This patients CHA2DS2-VASc Score and unadjusted Ischemic Stroke Rate (% per year) is equal to 7.2 % stroke rate/year from a score of 5  Above score calculated as 1 point each if present [CHF, HTN, DM, Vascular=MI/PAD/Aortic Plaque, Age if 65-74, or Male] Above score calculated as 2 points each if present [Age > 75, or Stroke/TIA/TE]  2. Obesity Body mass index is 34.86 kg/m. Lifestyle modifications encouraged including regular physical activity and weight loss.  3. HTN Stable, no changes today.  4, Aortic Aneurysm 5 cm on last echo. Followed by Dr Servando Snare   5. Chronic combined  systolic and diastolic HF  No signs of  fluid overload. Continue ARB, BB, and aldactone.   6. Daytime somnolence/snoring Discussed that treatment of OSA is important for obtaining and maintaining SR. Patient wants to defer sleep study for now.   F/u with Dr [name] and Dr Servando Snare as scheduled. Afib clinic in 6 months.   [facility] [address] [place], [place] [address] [phone]

## 2018-09-01 DIAGNOSIS — G510 Bell's palsy: Secondary | ICD-10-CM | POA: Diagnosis not present

## 2018-09-02 ENCOUNTER — Emergency Department (HOSPITAL_COMMUNITY): Payer: Medicare HMO

## 2018-09-02 ENCOUNTER — Other Ambulatory Visit: Payer: Self-pay

## 2018-09-02 ENCOUNTER — Encounter (HOSPITAL_COMMUNITY): Payer: Self-pay | Admitting: Pharmacy Technician

## 2018-09-02 ENCOUNTER — Telehealth: Payer: Self-pay | Admitting: Cardiology

## 2018-09-02 ENCOUNTER — Emergency Department (HOSPITAL_COMMUNITY)
Admission: EM | Admit: 2018-09-02 | Discharge: 2018-09-02 | Disposition: A | Discharge status: Home / Self Care | Payer: Medicare HMO | Attending: Emergency Medicine | Admitting: Emergency Medicine

## 2018-09-02 DIAGNOSIS — I110 Hypertensive heart disease with heart failure: Secondary | ICD-10-CM | POA: Insufficient documentation

## 2018-09-02 DIAGNOSIS — Z87891 Personal history of nicotine dependence: Secondary | ICD-10-CM | POA: Diagnosis not present

## 2018-09-02 DIAGNOSIS — G510 Bell's palsy: Secondary | ICD-10-CM | POA: Insufficient documentation

## 2018-09-02 DIAGNOSIS — R29810 Facial weakness: Secondary | ICD-10-CM | POA: Diagnosis not present

## 2018-09-02 DIAGNOSIS — G459 Transient cerebral ischemic attack, unspecified: Secondary | ICD-10-CM | POA: Diagnosis not present

## 2018-09-02 DIAGNOSIS — Z79899 Other long term (current) drug therapy: Secondary | ICD-10-CM | POA: Insufficient documentation

## 2018-09-02 DIAGNOSIS — I5032 Chronic diastolic (congestive) heart failure: Secondary | ICD-10-CM | POA: Diagnosis not present

## 2018-09-02 DIAGNOSIS — E119 Type 2 diabetes mellitus without complications: Secondary | ICD-10-CM | POA: Insufficient documentation

## 2018-09-02 IMAGING — MR MRI HEAD WITHOUT CONTRAST
12 of 13 series · 44 of 48 positions shown · non-contrast
Comparison: None.

CLINICAL DATA: TIA. Focal neuro deficit for greater than 6 hours.
Stroke suspected.

EXAM:
MRI HEAD WITHOUT CONTRAST
TECHNIQUE: Multiplanar, multiecho pulse sequences of the brain and surrounding
structures were obtained without intravenous contrast.

[Series 5: DWI · axial · 3.0mm · 0.88mm/px · z∈[-48,+108]mm · 9 of 108 slices shown (1 of 4)]
[im 1/108]
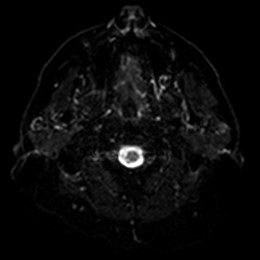
[im 14/108]
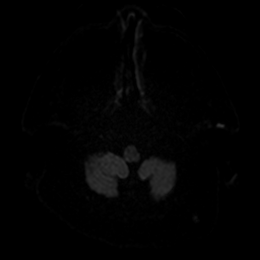
[im 27/108]
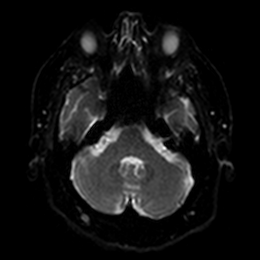
[im 41/108]
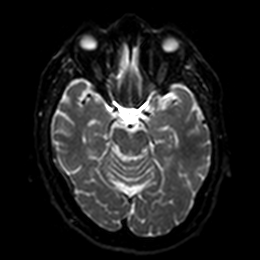
[im 54/108]
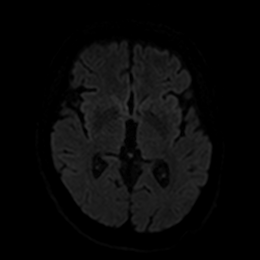
[im 67/108]
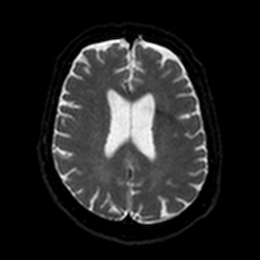
[im 81/108]
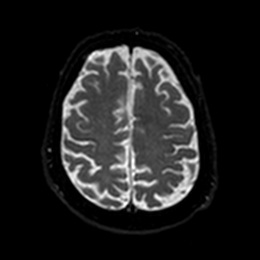
[im 94/108]
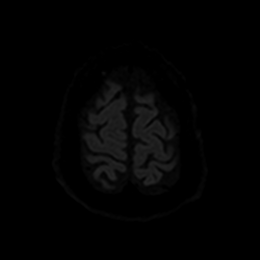
[im 108/108]
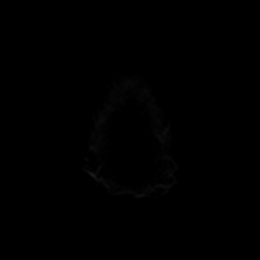

[Series 6: DWI · axial · 3.0mm · 0.88mm/px · z∈[-48,+108]mm · 4 of 54 slices shown (2 of 4)]
[im 1/54]
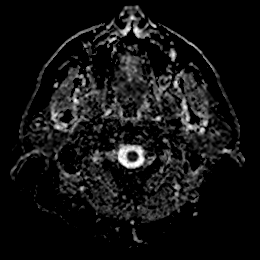
[im 18/54]
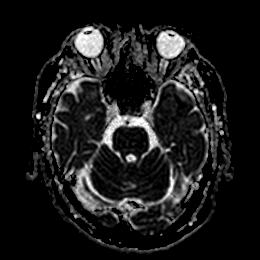
[im 36/54]
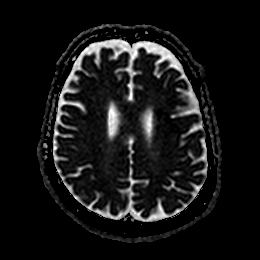
[im 54/54]
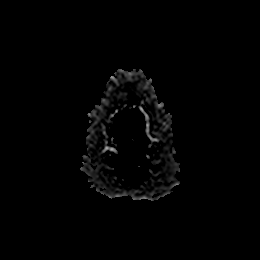

[Series 7: DWI · coronal · 4.0mm · 0.88mm/px · 5 of 76 slices shown (3 of 4)]
[im 1/76]
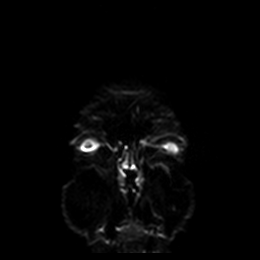
[im 19/76]
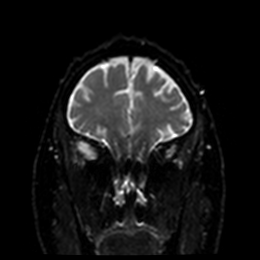
[im 38/76]
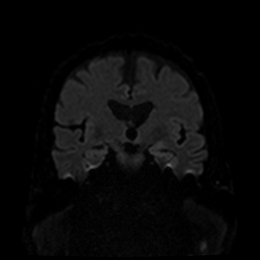
[im 57/76]
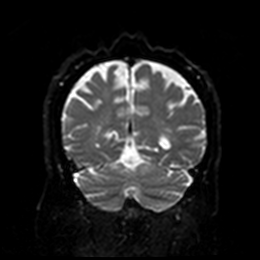
[im 76/76]
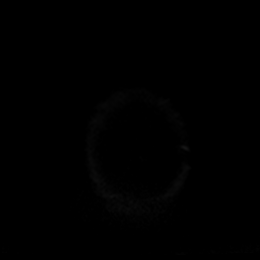

[Series 8: DWI · coronal · 4.0mm · 0.88mm/px · 3 of 38 slices shown (4 of 4)]
[im 1/38]
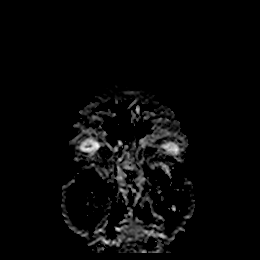
[im 19/38]
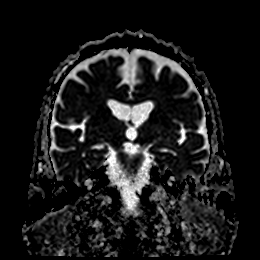
[im 38/38]
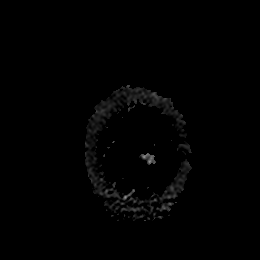

[Series 9: T1 · sagittal · 5.0mm · 0.78mm/px · 1 of 20 slices shown]
[im 1/20]
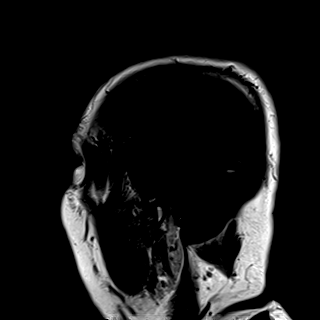

[Series 10: T2 · axial · 5.0mm · 0.72mm/px · z∈[-46,+96]mm · 2 of 25 slices shown (1 of 2)]
[im 1/25]
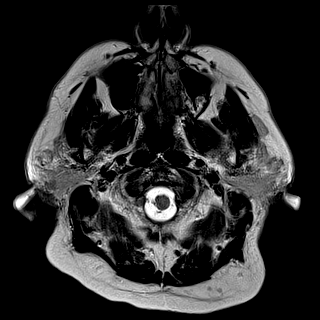
[im 25/25]
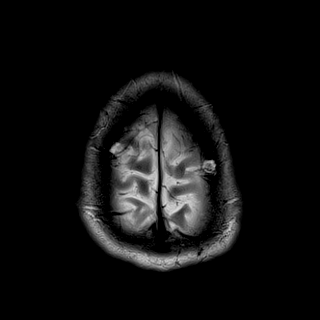

[Series 11: FLAIR · axial · 5.0mm · 0.45mm/px · z∈[-45,+97]mm · 2 of 25 slices shown]
[im 1/25]
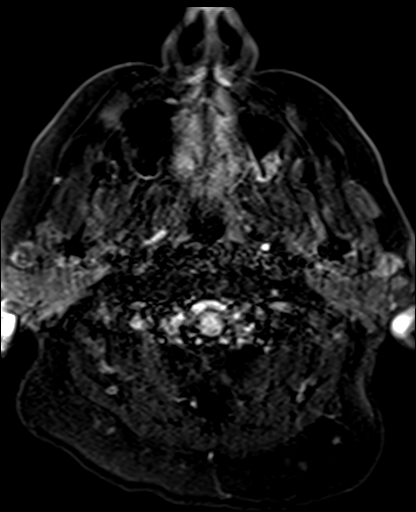
[im 25/25]
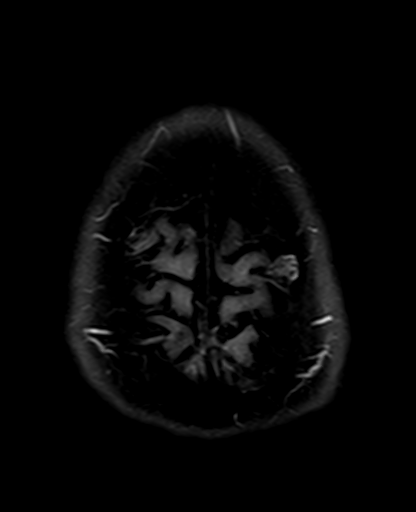

[Series 12: mag_images · axial · 3.0mm · 0.90mm/px · z∈[-67,+109]mm · 4 of 60 slices shown]
[im 1/60]
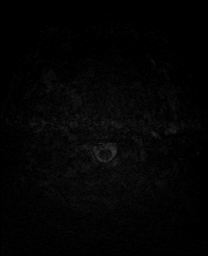
[im 20/60]
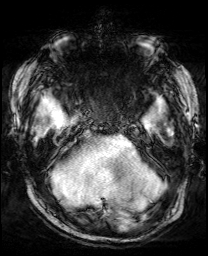
[im 40/60]
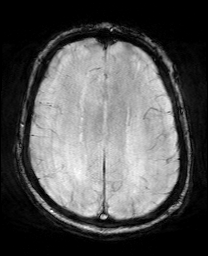
[im 60/60]
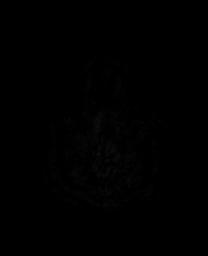

[Series 13: pha_images · axial · 3.0mm · 0.90mm/px · z∈[-64,+109]mm · 4 of 59 slices shown]
[im 1/59]
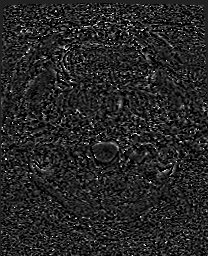
[im 20/59]
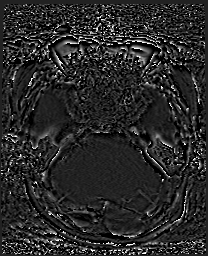
[im 39/59]
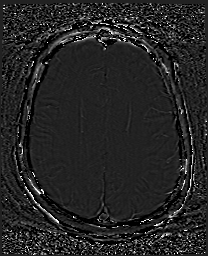
[im 59/59]
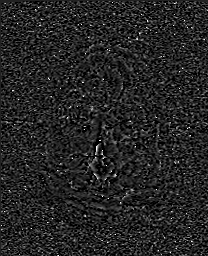

[Series 14: swi_images · axial · 3.0mm · 0.90mm/px · z∈[-67,+109]mm · 4 of 60 slices shown]
[im 1/60]
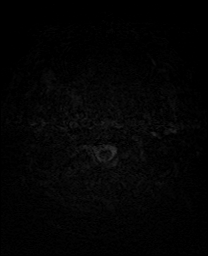
[im 20/60]
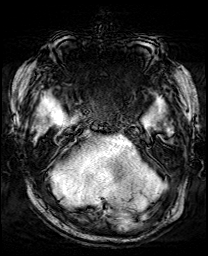
[im 40/60]
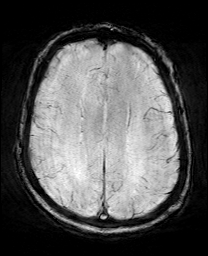
[im 60/60]
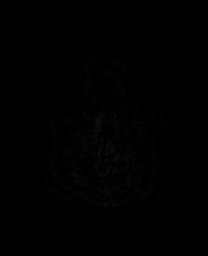

[Series 15: mip_images(sw) · axial · 24.0mm · 0.90mm/px · z∈[-56,+98]mm · 4 of 53 slices shown]
[im 1/53]
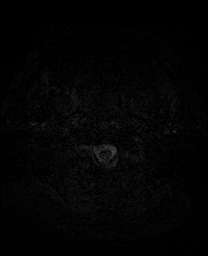
[im 18/53]
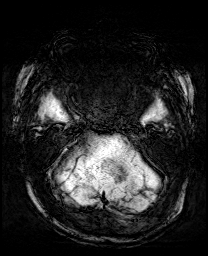
[im 35/53]
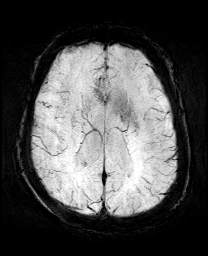
[im 53/53]
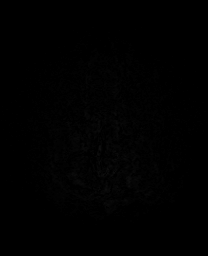

[Series 17: T2 · coronal · 5.0mm · 0.34mm/px · 2 of 29 slices shown (2 of 2)]
[im 1/29]
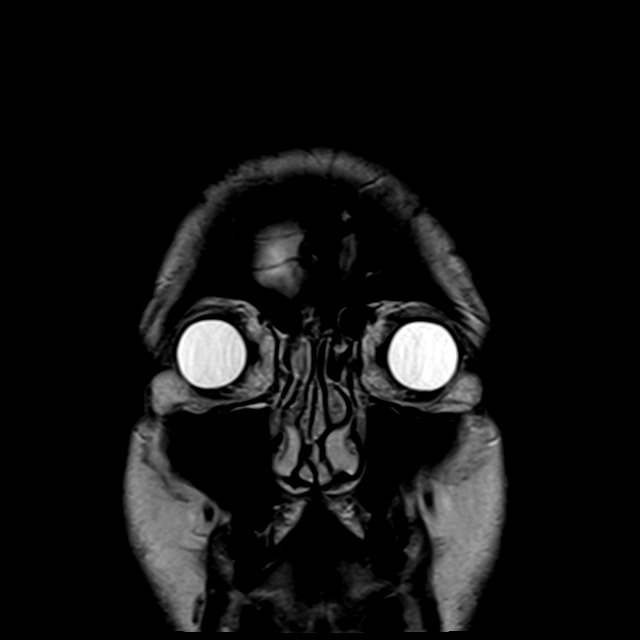
[im 29/29]
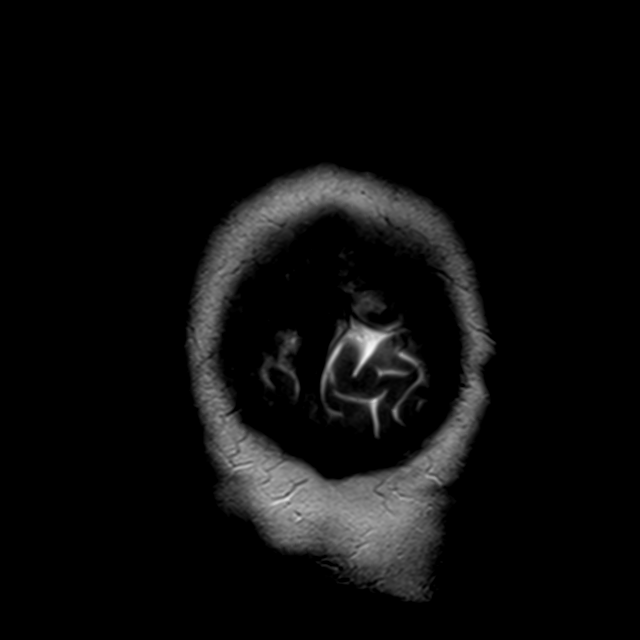

[44 of 48 positions shown; findings below may reference images not displayed]

FINDINGS: Brain: Mild generalized atrophy is within normal limits. No acute
infarct, hemorrhage, or mass lesion is present. The ventricles are
of normal size. No significant white matter lesions are present. No
significant extraaxial fluid collection is present. The internal
auditory canals are within normal limits.

Vascular: Flow is present in the major intracranial arteries.

Skull and upper cervical spine: Skull base is within normal limits.
The craniocervical junction is normal. Upper cervical spine is
within normal limits. Marrow signal is unremarkable.

Sinuses/Orbits: The globes and orbits are within normal limits.
IMPRESSION: 1. Normal MRI of the brain for age. No acute or focal lesion to
explain TIA.

## 2018-09-02 NOTE — ED Provider Notes (Signed)
[facility] EMERGENCY DEPARTMENT Provider Note   CSN: [id number] Arrival date & time: 09/02/18  1717    History   Chief Complaint Chief Complaint  Patient presents with  . Facial Droop    Since Wednesday    HPI Clinton Black is a 73 y.o. male.     Patient with history of atrial fibrillation, cardioversion approximately 2 and half weeks ago, on anticoagulant presents for further imaging for her significant Bell's palsy symptoms.  Started on Wednesday with the eye and then Thursday with left facial droop.  Patient has ear discomfort as well.  Patient has difficulty swallowing as well.  No history of similar.  No recent headaches or fevers.  Patient sent over for further imaging for worsening symptoms.     Past Medical History:  Diagnosis Date  . Arthritis   . BPH (benign prostatic hyperplasia)   . Diabetes mellitus type 2 in obese (HCC)    diet controlled  . HTN (hypertension)   . Hyperlipidemia, mixed   . Insomnia   . Obesity   . Thoracic aortic aneurysm, without rupture (HCC)    4.7 cm per chest ct with contrast 11-04-17    Patient Active Problem List   Diagnosis Date Noted  . Persistent atrial fibrillation 06/01/2018  . Claudication of both lower extremities ([place]) 07/15/2017  . Chronic diastolic CHF (congestive heart failure), NYHA class 2 ([place]) 07/06/2017  . Mild tetrahydrocannabinol (THC) abuse 12/30/2016  . Essential hypertension 12/24/2016  . Hyperlipidemia LDL goal <130 05/11/2016  . Type 2 diabetes mellitus with complication, without long-term current use of insulin ([place]) 05/11/2016  . Gross hematuria 02/11/2015  . Routine general medical examination at a health care facility 10/06/2013  . COLONIC POLYPS 05/28/2009  . Obesity, morbid ([place]) 05/28/2009  . 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 DEGENERATIVE JOINT DISEASE 05/28/2009  . Insomnia w/ sleep apnea 05/28/2009    Past Surgical History:  Procedure Laterality Date  .  CARDIOVERSION N/A 07/12/2018   Procedure: CARDIOVERSION;  Surgeon: Acie Fredrickson Wonda Cheng, MD;  Location: [facility] ENDOSCOPY;  Service: Cardiovascular;  Laterality: N/A;  . CARDIOVERSION N/A 08/22/2018   Procedure: CARDIOVERSION;  Surgeon: Skeet Latch, MD;  Location: [facility];  Service: Cardiovascular;  Laterality: N/A;  . COLONOSCOPY  2016  . CYST EXCISION     polynomial  . CYSTOSCOPY WITH INSERTION OF UROLIFT N/A 04/18/2018   Procedure: CYSTOSCOPY WITH INSERTION OF UROLIFT;  Surgeon: Cleon Gustin, MD;  Location: [facility];  Service: Urology;  Laterality: N/A;  . KNEE SURGERY Right 2000   arthroscopy  . TONSILLECTOMY    . widson teeth extraction          Home Medications    Prior to Admission medications   Medication Sig Start Date End Date Taking? Authorizing Provider  amiodarone (PACERONE) 200 MG tablet Take 200 mg by mouth 3 (three) times daily.   Yes [provider]  atorvastatin (LIPITOR) 20 MG tablet Take 1 tablet (20 mg total) by mouth daily. Patient taking differently: Take 20 mg by mouth every evening.  12/15/17  Yes [name], MD  erythromycin ophthalmic ointment Place into the left eye every 6 (six) hours. 09/01/18  Yes [provider]  Lidocaine 4 % PTCH Apply 1 patch topically daily as needed (pain).   Yes [provider]  losartan (COZAAR) 50 MG tablet Take 1 tablet (50 mg total) by mouth daily. 08/26/18  Yes [name], [name], MD  metoprolol succinate (  TOPROL-XL) 25 MG 24 hr tablet TAKE 1 TABLET BY MOUTH EVERY DAY Patient taking differently: Take 25 mg by mouth daily.  01/24/18  Yes [name], [name], MD  predniSONE (DELTASONE) 20 MG tablet Take 60 mg by mouth daily. For 7 days 09/01/18  Yes [provider]  rivaroxaban (XARELTO) 20 MG TABS tablet Take 1 tablet (20 mg total) by mouth daily with supper. 07/04/18  Yes [name], MD  spironolactone (ALDACTONE) 25 MG tablet Take 0.5 tablets (12.5 mg total) by mouth at  bedtime. 06/02/18  Yes [name], [name], MD  Tetrahydrozoline HCl (VISINE OP) Place 1 drop into both eyes daily as needed (dry eyes).   Yes [provider]  triazolam (HALCION) 0.25 MG tablet Take 1 tablet (0.25 mg total) by mouth at bedtime as needed. Patient taking differently: Take 0.25 mg by mouth at bedtime as needed for sleep.  06/01/18  Yes [name], MD  TURMERIC PO Take 10 mLs by mouth every evening.    Yes [provider]  valACYclovir (VALTREX) 1000 MG tablet Take 1,000 mg by mouth 3 (three) times daily. For 10 days 09/01/18  Yes [provider]    Family History Family History  Problem Relation Age of Onset  . Aneurysm Father   . Heart disease Father   . Varicose Veins Mother   . Arthritis Mother   . Macular degeneration Mother   . Cancer Neg Hx   . Colon cancer Neg Hx   . Esophageal cancer Neg Hx   . Rectal cancer Neg Hx   . Stomach cancer Neg Hx     Social History Social History   Tobacco Use  . Smoking status: Former Smoker    Packs/day: 1.50    Years: 5.00    Pack years: 7.50    Types: Cigarettes    Last attempt to quit: 06/15/1994    Years since quitting: 24.2  . Smokeless tobacco: Never Used  Substance Use Topics  . Alcohol use: Yes    Alcohol/week: 14.0 standard drinks    Types: 14 Shots of liquor per week    Comment: 1 drink of liquor per day  . Drug use: Yes    Types: Marijuana    Comment: occasionally marijuana     Allergies   Quinolones   Review of Systems Review of Systems  Constitutional: Negative for chills and fever.  HENT: Negative for congestion.   Eyes: Negative for visual disturbance.  Respiratory: Negative for shortness of breath.   Cardiovascular: Negative for chest pain.  Gastrointestinal: Negative for abdominal pain and vomiting.  Genitourinary: Negative for dysuria and flank pain.  Musculoskeletal: Negative for back pain, neck pain and neck stiffness.  Skin: Negative for rash.  Neurological:  Positive for facial asymmetry, weakness and numbness. Negative for light-headedness and headaches.     Physical Exam Updated Vital Signs BP 133/81   Pulse 85   Temp 98.6 F (37 C) (Oral)   Resp (!) 23   SpO2 97%   Physical Exam Vitals signs and nursing note reviewed.  Constitutional:      Appearance: He is well-developed.  HENT:     Head: Normocephalic and atraumatic.  Eyes:     General:        Right eye: No discharge.        Left eye: No discharge.     Conjunctiva/sclera: Conjunctivae normal.  Neck:     Musculoskeletal: Normal range of motion and neck supple.  Trachea: No tracheal deviation.  Cardiovascular:     Rate and Rhythm: Normal rate and regular rhythm.  Pulmonary:     Effort: Pulmonary effort is normal.     Breath sounds: Normal breath sounds.  Abdominal:     General: There is no distension.     Palpations: Abdomen is soft.     Tenderness: There is no abdominal tenderness. There is no guarding.  Skin:    General: Skin is warm.     Findings: No rash.  Neurological:     Mental Status: He is alert and oriented to person, place, and time.     GCS: GCS eye subscore is 4. GCS verbal subscore is 5. GCS motor subscore is 6.     Comments: Patient has left facial droop and difficulty closing left eye as well as difficulty wrinkling left forehead.  Visual fields intact the fingers.  5+ strength upper and lower extremities equal bilateral and sensation intact.      ED Treatments / Results  Labs (all labs ordered are listed, but only abnormal results are displayed) Labs Reviewed - No data to display  EKG None  Radiology Mr Brain Wo Contrast  Result Date: 09/02/2018 CLINICAL DATA:  TIA. Focal neuro deficit for greater than 6 hours. Stroke suspected. EXAM: MRI HEAD WITHOUT CONTRAST TECHNIQUE: Multiplanar, multiecho pulse sequences of the brain and surrounding structures were obtained without intravenous contrast. COMPARISON:  None. FINDINGS: Brain: Mild  generalized atrophy is within normal limits. No acute infarct, hemorrhage, or mass lesion is present. The ventricles are of normal size. No significant white matter lesions are present. No significant extraaxial fluid collection is present. The internal auditory canals are within normal limits. Vascular: Flow is present in the major intracranial arteries. Skull and upper cervical spine: Skull base is within normal limits. The craniocervical junction is normal. Upper cervical spine is within normal limits. Marrow signal is unremarkable. Sinuses/Orbits: The globes and orbits are within normal limits. IMPRESSION: 1. Normal MRI of the brain for age. No acute or focal lesion to explain TIA. Electronically Signed   By: San Morelle M.D.   On: 09/02/2018 21:51    Procedures Procedures (including critical care time)  Medications Ordered in ED Medications - No data to display   Initial Impression / Assessment and Plan / ED Course  I have reviewed the triage vital signs and the nursing notes.  Pertinent labs & imaging results that were available during my care of the patient were reviewed by me and considered in my medical decision making (see chart for details).       Patient presents with worsening Bell's palsy symptoms however with significant risk factors/worsening since cardioversion sent over for image.  Discussed with neurology recommends MRI of the brain.  Patient is already on medication treatment for Bell's palsy.  MRI neg for stroke.  Results and differential diagnosis were discussed with the patient/parent/guardian. Xrays were independently reviewed by myself.  Close follow up outpatient was discussed, comfortable with the plan.   Medications - No data to display  Vitals:   09/02/18 1930 09/02/18 1945 09/02/18 2000 09/02/18 2015  BP:      Pulse: 87 89 92 85  Resp: 15 15 14  (!) 23  Temp:      TempSrc:      SpO2: 97% 99% 97% 97%    Final diagnoses:  Bell's palsy      Final Clinical Impressions(s) / ED Diagnoses   Final diagnoses:  Bell's palsy  ED Discharge Orders    None       Elnora Morrison, MD 09/02/18 2215

## 2018-09-02 NOTE — Telephone Encounter (Addendum)
Triage call received from the operator. Spoke with the pt who sts that yesterday afternoon he developed slurred speech,tingling in his left ear, drooping of the left side of his mouth, and he is unable to close his left eye completely. He went to an urgentcare who suggested that he go to the ED or call his pcp to have a CT performed. His symptoms have not completely resolved today. He did call his pcp yesterday and was told it would be 48hrs for a call back, so he called our office this afternoon.  He has a dx of Afib and is taking Xarelto 20mg  daily. He has had a recent dccv on 08/22/18. Adv the pt that unfortunately I endorse the recommendation for him to go to the ED asap for evaluation. Adv him that I sympathized with the his concern surrounding the COVID-19 pandemic, but feel that this is the most appropriate action. Pt became agitated on the phone and sts that he is a high risk pt and does not want to go to the ED. Adv him that if he is having a neurologic event time is of the essence and that we could not have him evaluated here in a timely manner. Reiterated my recommendation for him to have an ED eval. He would like the advice of a Provider. Adv him that Dr.Jordan is not in the office, I will fwd the message to one of his partnering MDs and call back with their recommendation.

## 2018-09-02 NOTE — ED Notes (Signed)
Patient verbalizes understanding of discharge instructions. Opportunity for questioning and answers were provided. 

## 2018-09-02 NOTE — ED Notes (Signed)
Patient transported to MRI 

## 2018-09-02 NOTE — Telephone Encounter (Signed)
New Message    Pt is calling because about a week ago he started feeling a pain in his left ear. He noticed the night before that he couldn't close his left eye. Yesterday he noticed he cannot form a smile on the left side of his mouth. And that he takes a drink and water will spill from his mouth  Pt went into [facility] and the Dr advised the patient to follow up with cardiology or go to the ER for a ct  Patient also says he is having slurred speech

## 2018-09-02 NOTE — Telephone Encounter (Signed)
[name] called the office today with complaints of left facial droop and left ear pain.  His symptoms started about 48 hours ago.  He went to [facility].  He was examined there and felt to have Bell's palsy.  He was placed on prednisone and valacyclovir.  He was actually given an appointment with her neurologist at [facility] today but he missed it.  He called the office here saying that he was told he would need a CAT scan or an MRI.  He does not want to go to the emergency room.  I discussed the situation with Dr. Schuyler Amor in the office today.  An outpatient study is not an option as its now late Friday afternoon and there are no elective studies being done.  I suggested the patient go to the emergency room for an MRI.  If he did not want to do this then he should contact the provider that saw him at [facility] about neurology follow-up.  Possibly this could be rescheduled.  He did say his symptoms are not progressing.  He denies any confusion or left arm or leg pain.  Kerin Ransom PA-C 09/02/2018 3:55 PM

## 2018-09-02 NOTE — ED Notes (Signed)
Pt back from MRI and given water to drink.

## 2018-09-02 NOTE — ED Triage Notes (Signed)
Pt arrives via pov with L facial droop and eye watering since Wednesday. Pt seen at [facility] yesterday and dx with Bells palsy. Michela Pitcher he was told to go to the ED for a CT scan.

## 2018-09-02 NOTE — Discharge Instructions (Signed)
Continue Bell's palsy treatment medications as previously prescribed.  Return for new or worsening symptoms.

## 2018-09-14 ENCOUNTER — Other Ambulatory Visit: Payer: Self-pay | Admitting: Cardiothoracic Surgery

## 2018-09-14 DIAGNOSIS — I712 Thoracic aortic aneurysm, without rupture, unspecified: Secondary | ICD-10-CM

## 2018-10-12 ENCOUNTER — Other Ambulatory Visit: Payer: Self-pay | Admitting: Cardiology

## 2018-10-12 ENCOUNTER — Other Ambulatory Visit: Payer: Self-pay | Admitting: Internal Medicine

## 2018-10-12 DIAGNOSIS — G4700 Insomnia, unspecified: Secondary | ICD-10-CM

## 2018-10-12 DIAGNOSIS — G4730 Sleep apnea, unspecified: Principal | ICD-10-CM

## 2018-10-20 ENCOUNTER — Telehealth: Payer: Self-pay

## 2018-10-20 ENCOUNTER — Other Ambulatory Visit: Payer: Self-pay

## 2018-10-20 ENCOUNTER — Ambulatory Visit (INDEPENDENT_AMBULATORY_CARE_PROVIDER_SITE_OTHER): Payer: Medicare HMO | Admitting: Internal Medicine

## 2018-10-20 ENCOUNTER — Encounter: Payer: Self-pay | Admitting: Internal Medicine

## 2018-10-20 ENCOUNTER — Ambulatory Visit: Payer: Medicare HMO | Admitting: Internal Medicine

## 2018-10-20 ENCOUNTER — Other Ambulatory Visit (INDEPENDENT_AMBULATORY_CARE_PROVIDER_SITE_OTHER): Payer: Medicare HMO

## 2018-10-20 VITALS — BP 130/70 | HR 88 | Temp 98.3°F | Resp 16 | Ht 77.0 in | Wt 291.2 lb

## 2018-10-20 DIAGNOSIS — R7989 Other specified abnormal findings of blood chemistry: Secondary | ICD-10-CM

## 2018-10-20 DIAGNOSIS — I10 Essential (primary) hypertension: Secondary | ICD-10-CM

## 2018-10-20 DIAGNOSIS — E118 Type 2 diabetes mellitus with unspecified complications: Secondary | ICD-10-CM

## 2018-10-20 LAB — HEMOGLOBIN A1C: Hgb A1c MFr Bld: 7.3 % — ABNORMAL HIGH (ref 4.6–6.5)

## 2018-10-20 NOTE — Progress Notes (Signed)
Subjective:  Patient ID: Clinton Black, male    DOB: Aug 15, 1945  Age: 73 y.o. MRN: [id number]  CC: Diabetes; Hypertension; and Atrial Fibrillation   HPI SULIMAN TERMINI presents for f/up - He has his baseline level of DOE.  He denies any recent episodes of CP, diaphoresis, or lower extremity edema.  He remains in atrial fibrillation and has decided to stop taking amiodarone.  He has a follow-up soon with his cardiologist and his cardiothoracic surgeon.  He is not monitoring his blood sugars.  He denies polyuria, polydipsia, or polyphagia.  Outpatient Medications Prior to Visit  Medication Sig Dispense Refill  . atorvastatin (LIPITOR) 20 MG tablet Take 1 tablet (20 mg total) by mouth daily. (Patient taking differently: Take 20 mg by mouth every evening. ) 90 tablet 1  . losartan (COZAAR) 50 MG tablet Take 1 tablet (50 mg total) by mouth daily. 90 tablet 1  . metoprolol succinate (TOPROL-XL) 25 MG 24 hr tablet TAKE 1 TABLET BY MOUTH EVERY DAY 90 tablet 2  . rivaroxaban (XARELTO) 20 MG TABS tablet Take 1 tablet (20 mg total) by mouth daily with supper. 90 tablet 1  . spironolactone (ALDACTONE) 25 MG tablet Take 0.5 tablets (12.5 mg total) by mouth at bedtime. 45 tablet 3  . triazolam (HALCION) 0.25 MG tablet TAKE 1 TABLET (0.25 MG TOTAL) BY MOUTH AT BEDTIME AS NEEDED. 30 tablet 1  . TURMERIC PO Take 10 mLs by mouth every evening.     Marland Kitchen amiodarone (PACERONE) 200 MG tablet Take 200 mg by mouth 3 (three) times daily.    Marland Kitchen erythromycin ophthalmic ointment Place into the left eye every 6 (six) hours.    . Lidocaine 4 % PTCH Apply 1 patch topically daily as needed (pain).    . predniSONE (DELTASONE) 20 MG tablet Take 60 mg by mouth daily. For 7 days    . Tetrahydrozoline HCl (VISINE OP) Place 1 drop into both eyes daily as needed (dry eyes).    . valACYclovir (VALTREX) 1000 MG tablet Take 1,000 mg by mouth 3 (three) times daily. For 10 days     No facility-administered medications prior to  visit.     ROS Review of Systems  Constitutional: Negative for diaphoresis, fatigue and unexpected weight change.  HENT: Negative.  Negative for trouble swallowing.   Respiratory: Positive for shortness of breath (DOE). Negative for cough and wheezing.   Cardiovascular: Negative for chest pain, palpitations and leg swelling.  Gastrointestinal: Negative for abdominal pain, constipation, diarrhea, nausea and vomiting.  Endocrine: Negative for cold intolerance, heat intolerance, polydipsia, polyphagia and polyuria.  Genitourinary: Negative.  Negative for difficulty urinating and dysuria.  Musculoskeletal: Negative.  Negative for arthralgias and myalgias.  Skin: Negative.  Negative for color change.  Neurological: Negative.  Negative for dizziness, weakness and light-headedness.  Hematological: Negative for adenopathy. Does not bruise/bleed easily.  Psychiatric/Behavioral: Negative.     Objective:  BP 130/70 (BP Location: Left Arm, Patient Position: Sitting, Cuff Size: Large)   Pulse 88   Temp 98.3 F (36.8 C) (Oral)   Resp 16   Ht 6\' 5"  (1.956 m)   Wt 291 lb 4 oz (132.1 kg)   SpO2 98%   BMI 34.54 kg/m   BP Readings from Last 3 Encounters:  10/20/18 130/70  09/02/18 119/76  08/29/18 124/66    Wt Readings from Last 3 Encounters:  10/20/18 291 lb 4 oz (132.1 kg)  08/29/18 294 lb (133.4 kg)  08/18/18 291 lb 12.8  oz (132.4 kg)    Physical Exam Vitals signs reviewed.  Constitutional:      Appearance: He is not ill-appearing or diaphoretic.  HENT:     Nose: Nose normal.     Mouth/Throat:     Mouth: Mucous membranes are moist.     Pharynx: No oropharyngeal exudate or posterior oropharyngeal erythema.  Eyes:     General: No scleral icterus. Cardiovascular:     Rate and Rhythm: Normal rate. Rhythm irregularly irregular.     Heart sounds: No murmur. No systolic murmur. No diastolic murmur. No gallop.   Pulmonary:     Effort: Pulmonary effort is normal.     Breath sounds:  No stridor. No wheezing, rhonchi or rales.  Abdominal:     General: Abdomen is flat.     Palpations: There is no mass.     Tenderness: There is no abdominal tenderness. There is no guarding.  Musculoskeletal:     Right lower leg: No edema.     Left lower leg: No edema.  Skin:    General: Skin is warm and dry.     Coloration: Skin is not pale.  Neurological:     General: No focal deficit present.  Psychiatric:        Mood and Affect: Mood normal.        Behavior: Behavior normal.     Lab Results  Component Value Date   WBC 8.2 08/18/2018   HGB 15.5 08/18/2018   HCT 47.6 08/18/2018   PLT 214 08/18/2018   GLUCOSE 134 (H) 10/20/2018   CHOL 110 06/01/2018   TRIG 137.0 06/01/2018   HDL 34.30 (L) 06/01/2018   LDLDIRECT 120.0 05/11/2016   LDLCALC 48 06/01/2018   ALT 21 06/01/2018   AST 15 06/01/2018   NA 141 10/20/2018   K 4.6 10/20/2018   CL 107 10/20/2018   CREATININE 1.01 10/20/2018   BUN 19 10/20/2018   CO2 22 10/20/2018   TSH 5.35 (H) 10/20/2018   PSA 0.94 11/01/2017   HGBA1C 7.3 (H) 10/20/2018   MICROALBUR 4.6 (H) 06/01/2018    Mr Brain Wo Contrast  Result Date: 09/02/2018 CLINICAL DATA:  TIA. Focal neuro deficit for greater than 6 hours. Stroke suspected. EXAM: MRI HEAD WITHOUT CONTRAST TECHNIQUE: Multiplanar, multiecho pulse sequences of the brain and surrounding structures were obtained without intravenous contrast. COMPARISON:  None. FINDINGS: Brain: Mild generalized atrophy is within normal limits. No acute infarct, hemorrhage, or mass lesion is present. The ventricles are of normal size. No significant white matter lesions are present. No significant extraaxial fluid collection is present. The internal auditory canals are within normal limits. Vascular: Flow is present in the major intracranial arteries. Skull and upper cervical spine: Skull base is within normal limits. The craniocervical junction is normal. Upper cervical spine is within normal limits. Marrow  signal is unremarkable. Sinuses/Orbits: The globes and orbits are within normal limits. IMPRESSION: 1. Normal MRI of the brain for age. No acute or focal lesion to explain TIA. Electronically Signed   By: San Morelle M.D.   On: 09/02/2018 21:51    Assessment & Plan:   Vedant was seen today for diabetes, hypertension and atrial fibrillation.  Diagnoses and all orders for this visit:  Essential hypertension- His blood pressure is adequately well controlled.  Electrolytes and renal function are normal. -     Basic metabolic panel; Future -     TSH; Future  Type 2 diabetes mellitus with complication, without long-term current use  of insulin ([place])- His A1c is up to 7.3%.  I have asked him to start taking metformin and an SGLT2 inhibitor for blood sugar control and for cardiovascular risk reduction. -     Ambulatory referral to Ophthalmology -     Basic metabolic panel; Future -     Hemoglobin A1c; Future -     Empagliflozin-metFORMIN HCl 5-500 MG TABS; Take 1 tablet by mouth 2 (two) times a day.  TSH elevation- His TSH is slightly elevated but clinically he appears euthyroid.  I do not think thyroid replacement therapy is indicated at this time.  I have asked him to come back in about 3 months for me to recheck his TSH. In the meantime he will let me know if he develops any symptoms of hypothyroidism.   I have discontinued Cathrine Muster. Pless's Tetrahydrozoline HCl (VISINE OP), Lidocaine, amiodarone, predniSONE, valACYclovir, and erythromycin. I am also having him start on Empagliflozin-metFORMIN HCl. Additionally, I am having him maintain his TURMERIC PO, atorvastatin, spironolactone, rivaroxaban, losartan, metoprolol succinate, and triazolam.  Meds ordered this encounter  Medications  . Empagliflozin-metFORMIN HCl 5-500 MG TABS    Sig: Take 1 tablet by mouth 2 (two) times a day.    Dispense:  180 tablet    Refill:  0     Follow-up: Return in about 6 months (around 04/22/2019).   Scarlette Calico, MD

## 2018-10-20 NOTE — Patient Instructions (Signed)
Type 2 Diabetes Mellitus, Diagnosis, Adult Type 2 diabetes (type 2 diabetes mellitus) is a long-term (chronic) disease. In type 2 diabetes, one or both of these problems may be present:  The pancreas does not make enough of a hormone called insulin.  Cells in the body do not respond properly to insulin that the body makes (insulin resistance). Normally, insulin allows blood sugar (glucose) to enter cells in the body. The cells use glucose for energy. Insulin resistance or lack of insulin causes excess glucose to build up in the blood instead of going into cells. As a result, high blood glucose (hyperglycemia) develops. What increases the risk? The following factors may make you more likely to develop type 2 diabetes:  Having a family member with type 2 diabetes.  Being overweight or obese.  Having an inactive (sedentary) lifestyle.  Having been diagnosed with insulin resistance.  Having a history of prediabetes, gestational diabetes, or polycystic ovary syndrome (PCOS).  Being of American-Indian, African-American, Hispanic/Latino, or Asian/Pacific Islander descent. What are the signs or symptoms? In the early stage of this condition, you may not have symptoms. Symptoms develop slowly and may include:  Increased thirst (polydipsia).  Increased hunger(polyphagia).  Increased urination (polyuria).  Increased urination during the night (nocturia).  Unexplained weight loss.  Frequent infections that keep coming back (recurring).  Fatigue.  Weakness.  Vision changes, such as blurry vision.  Cuts or bruises that are slow to heal.  Tingling or numbness in the hands or feet.  Dark patches on the skin (acanthosis nigricans). How is this diagnosed? This condition is diagnosed based on your symptoms, your medical history, a physical exam, and your blood glucose level. Your blood glucose may be checked with one or more of the following blood tests:  A fasting blood glucose (FBG)  test. You will not be allowed to eat (you will fast) for 8 hours or longer before a blood sample is taken.  A random blood glucose test. This test checks blood glucose at any time of day regardless of when you ate.  An A1c (hemoglobin A1c) blood test. This test provides information about blood glucose control over the previous 2-3 months.  An oral glucose tolerance test (OGTT). This test measures your blood glucose at two times: ? After fasting. This is your baseline blood glucose level. ? Two hours after drinking a beverage that contains glucose. You may be diagnosed with type 2 diabetes if:  Your FBG level is 126 mg/dL (7.0 mmol/L) or higher.  Your random blood glucose level is 200 mg/dL (11.1 mmol/L) or higher.  Your A1c level is 6.5% or higher.  Your OGTT result is higher than 200 mg/dL (11.1 mmol/L). These blood tests may be repeated to confirm your diagnosis. How is this treated? Your treatment may be managed by a specialist called an endocrinologist. Type 2 diabetes may be treated by following instructions from your health care provider about:  Making diet and lifestyle changes. This may include: ? Following an individualized nutrition plan that is developed by a diet and nutrition specialist (registered dietitian). ? Exercising regularly. ? Finding ways to manage stress.  Checking your blood glucose level as often as told.  Taking diabetes medicines or insulin daily. This helps to keep your blood glucose levels in the healthy range. ? If you use insulin, you may need to adjust the dosage depending on how physically active you are and what foods you eat. Your health care provider will tell you how to adjust your dosage.    Taking medicines to help prevent complications from diabetes, such as: ? Aspirin. ? Medicine to lower cholesterol. ? Medicine to control blood pressure. Your health care provider will set individualized treatment goals for you. Your goals will be based on  your age, other medical conditions you have, and how you respond to diabetes treatment. Generally, the goal of treatment is to maintain the following blood glucose levels:  Before meals (preprandial): 80-130 mg/dL (4.4-7.2 mmol/L).  After meals (postprandial): below 180 mg/dL (10 mmol/L).  A1c level: less than 7%. Follow these instructions at home: Questions to ask your health care provider  Consider asking the following questions: ? Do I need to meet with a diabetes educator? ? Where can I find a support group for people with diabetes? ? What equipment will I need to manage my diabetes at home? ? What diabetes medicines do I need, and when should I take them? ? How often do I need to check my blood glucose? ? What number can I call if I have questions? ? When is my next appointment? General instructions  Take over-the-counter and prescription medicines only as told by your health care provider.  Keep all follow-up visits as told by your health care provider. This is important.  For more information about diabetes, visit: ? American Diabetes Association (ADA): www.diabetes.org ? American Association of Diabetes Educators (AADE): www.diabeteseducator.org Contact a health care provider if:  Your blood glucose is at or above 240 mg/dL (13.3 mmol/L) for 2 days in a row.  You have been sick or have had a fever for 2 days or longer, and you are not getting better.  You have any of the following problems for more than 6 hours: ? You cannot eat or drink. ? You have nausea and vomiting. ? You have diarrhea. Get help right away if:  Your blood glucose is lower than 54 mg/dL (3.0 mmol/L).  You become confused or you have trouble thinking clearly.  You have difficulty breathing.  You have moderate or large ketone levels in your urine. Summary  Type 2 diabetes (type 2 diabetes mellitus) is a long-term (chronic) disease. In type 2 diabetes, the pancreas does not make enough of a  hormone called insulin, or cells in the body do not respond properly to insulin that the body makes (insulin resistance).  This condition is treated by making diet and lifestyle changes and taking diabetes medicines or insulin.  Your health care provider will set individualized treatment goals for you. Your goals will be based on your age, other medical conditions you have, and how you respond to diabetes treatment.  Keep all follow-up visits as told by your health care provider. This is important. This information is not intended to replace advice given to you by your health care provider. Make sure you discuss any questions you have with your health care provider. Document Released: 06/01/2005 Document Revised: 12/31/2016 Document Reviewed: 07/05/2015 Elsevier Interactive Patient Education  2019 [employer]vier Inc.  

## 2018-10-20 NOTE — Telephone Encounter (Signed)
Copied from [place] [id number]. Topic: Appointment Scheduling - Scheduling Inquiry for Clinic >> Oct 19, 2018  2:55 PM Valla Leaver wrote: Reason for CRM: **Patient wants to ask Dr. Ronnald Ramp if he needs to come in for an appointment now or later this year? He prescribed his sleeping medication.

## 2018-10-21 LAB — TSH: TSH: 5.35 u[IU]/mL — ABNORMAL HIGH (ref 0.35–4.50)

## 2018-10-21 LAB — BASIC METABOLIC PANEL
BUN: 19 mg/dL (ref 6–23)
CO2: 22 mEq/L (ref 19–32)
Calcium: 9.4 mg/dL (ref 8.4–10.5)
Chloride: 107 mEq/L (ref 96–112)
Creatinine, Ser: 1.01 mg/dL (ref 0.40–1.50)
GFR: 72.74 mL/min (ref >60.00)
Glucose, Bld: 134 mg/dL — ABNORMAL HIGH (ref 70–99)
Potassium: 4.6 mEq/L (ref 3.5–5.1)
Sodium: 141 mEq/L (ref 135–145)

## 2018-10-22 ENCOUNTER — Encounter: Payer: Self-pay | Admitting: Internal Medicine

## 2018-10-22 DIAGNOSIS — R7989 Other specified abnormal findings of blood chemistry: Secondary | ICD-10-CM | POA: Insufficient documentation

## 2018-10-22 MED ORDER — EMPAGLIFLOZIN-METFORMIN HCL 5-500 MG PO TABS: 1.0000 | ORAL_TABLET | Freq: Two times a day (BID) | ORAL | 0 refills | Status: DC

## 2018-10-24 ENCOUNTER — Ambulatory Visit: Payer: Medicare HMO | Admitting: Cardiology

## 2018-11-17 NOTE — Telephone Encounter (Signed)
Pt was seen on 10/20/2018. Closing note.

## 2018-11-23 ENCOUNTER — Other Ambulatory Visit: Payer: Self-pay | Admitting: Internal Medicine

## 2018-11-23 DIAGNOSIS — I4891 Unspecified atrial fibrillation: Secondary | ICD-10-CM

## 2018-12-09 ENCOUNTER — Other Ambulatory Visit: Payer: Self-pay | Admitting: Internal Medicine

## 2018-12-09 DIAGNOSIS — G4700 Insomnia, unspecified: Secondary | ICD-10-CM

## 2018-12-09 DIAGNOSIS — G4730 Sleep apnea, unspecified: Secondary | ICD-10-CM

## 2018-12-14 ENCOUNTER — Other Ambulatory Visit: Payer: Self-pay

## 2018-12-14 NOTE — Progress Notes (Signed)
[address]       [place], [address]             [phone]                    [name] [facility] Medical Record #[id number] Date of Birth: 12-21-1945  Referring: [name], [name], MD Primary Care: [name], MD  Chief Complaint:    Chief Complaint  Patient presents with   Thoracic Aortic Aneurysm    6 month f/u with Chest CTA    History of Present Illness:    Clinton Black 72 y.o. male followed in office for ascending aortic dilatation.   He returns to the office today with a follow-up CTA of the chest.    Since she was last seen he is developed atrial fibrillation, had cardioversions twice.  Notes that he felt much better for 3 days until atrial fib returned.  He has not had an appointment in atrial fib clinic since March.  He now notes that he has periods of heart rate between 180 and 50.  Since last seen his overall functional status has deteriorated, he notes that he is fatigued all the time, does have some shortness of breath, denies pedal edema  Patient developed Bell's palsy in the winter 2020, symptoms have since resolved  Patient has no family history of aortic dissection he does note that his father died at age 75 suddenly with a cerebral embolus mother died at age 32 he is had one sister died at age 44 of breast cancer and another sister has had breast cancer in atrial fib still alive patient has no children no family history of aortic dissections or aneurysm.   Current Activity/ Functional Status:  Patient is independent with mobility/ambulation, transfers, ADL's, IADL's.   Zubrod Score: At the time of surgery this patients most appropriate activity status/level should be described as: []     0    Normal activity, no symptoms []     1    Restricted in physical strenuous activity but ambulatory, able to do out light work [x]     2    Ambulatory and capable of self care, unable to do work activities, up and about                >50 % of waking hours                              []     3    Only limited self care, in bed greater than 50% of waking hours []     4    Completely disabled, no self care, confined to bed or chair []     5    Moribund   Past Medical History:  Diagnosis Date   Arthritis    BPH (benign prostatic hyperplasia)    Diabetes mellitus type 2 in obese (HCC)    diet controlled   HTN (hypertension)    Hyperlipidemia, mixed    Insomnia    Obesity    Thoracic aortic aneurysm, without rupture ([facility])    4.7 cm per chest ct with contrast 11-04-17    Past Surgical History:  Procedure Laterality Date   CARDIOVERSION N/A 07/12/2018   Procedure: CARDIOVERSION;  Surgeon: Nahser, Wonda Cheng, MD;  Location: [facility];  Service: Cardiovascular;  Laterality: N/A;   CARDIOVERSION N/A 08/22/2018   Procedure: CARDIOVERSION;  Surgeon: Skeet Latch, MD;  Location: [facility];  Service: Cardiovascular;  Laterality: N/A;   COLONOSCOPY  2016   CYST EXCISION     polynomial   CYSTOSCOPY WITH INSERTION OF UROLIFT N/A 04/18/2018   Procedure: CYSTOSCOPY WITH INSERTION OF UROLIFT;  Surgeon: Cleon Gustin, MD;  Location: [facility];  Service: Urology;  Laterality: N/A;   KNEE SURGERY Right 2000   arthroscopy   TONSILLECTOMY     widson teeth extraction      Family History  Problem Relation Age of Onset   Aneurysm Father    Heart disease Father    Varicose Veins Mother    Arthritis Mother    Macular degeneration Mother    Cancer Neg Hx    Colon cancer Neg Hx    Esophageal cancer Neg Hx    Rectal cancer Neg Hx    Stomach cancer Neg Hx     Social History   Socioeconomic History   Marital status: Single    Spouse name: Not on file   Number of children: Not on file   Years of education: Not on file   Highest education level: Not on file  Occupational History   Not on file  Social Needs   Financial resource strain: Not on file   Food insecurity      Worry: Not on file    Inability: Not on file   Transportation needs    Medical: Not on file    Non-medical: Not on file  Tobacco Use   Smoking status: Former Smoker    Packs/day: 1.50    Years: 5.00    Pack years: 7.50    Types: Cigarettes    Quit date: 06/15/1994    Years since quitting: 24.5   Smokeless tobacco: Never Used  Substance and Sexual Activity   Alcohol use: Yes    Alcohol/week: 14.0 standard drinks    Types: 14 Shots of liquor per week    Comment: 1 drink of liquor per day   Drug use: Yes    Types: Marijuana    Comment: occasionally marijuana   Sexual activity: Yes    Partners: Female    Birth control/protection: Condom    Comment: condom use most of the time but not always  Social History Narrative   Designer, 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 married '72- 3 years/divorced; married '89- 3 years/divorced;. married '03 - 3 years/divorced. 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    Social History   Tobacco Use  Smoking Status Former Smoker   Packs/day: 1.50   Years: 5.00   Pack years: 7.50   Types: Cigarettes   Quit date: 06/15/1994   Years since quitting: 24.5  Smokeless Tobacco Never Used    Social History   Substance and Sexual Activity  Alcohol Use Yes   Alcohol/week: 14.0 standard drinks   Types: 14 Shots of liquor per week   Comment: 1 drink of liquor per day     Allergies  Allergen Reactions   Quinolones Other (See Comments)    Patient was warned about not using Cipro and similar antibiotics. Recent studies have raised concern that fluoroquinolone antibiotics could be associated with an increased risk of aortic aneurysm Fluoroquinolones have non-antimicrobial properties that might jeopardise the integrity of the extracellular matrix of the vascular wall In a  propensity score matched cohort study in [place], there was a 66% increased rate of aortic aneurysm or dissection associated with oral fluoroquinolone use, compared wit  Current  Outpatient Medications  Medication Sig Dispense Refill   atorvastatin (LIPITOR) 20 MG tablet Take 1 tablet (20 mg total) by mouth daily. (Patient taking differently: Take 20 mg by mouth every evening. ) 90 tablet 1   Empagliflozin-metFORMIN HCl 5-500 MG TABS Take 1 tablet by mouth 2 (two) times a day. 180 tablet 0   losartan (COZAAR) 50 MG tablet Take 1 tablet (50 mg total) by mouth daily. 90 tablet 1   metoprolol succinate (TOPROL-XL) 25 MG 24 hr tablet TAKE 1 TABLET BY MOUTH EVERY DAY 90 tablet 2   spironolactone (ALDACTONE) 25 MG tablet Take 0.5 tablets (12.5 mg total) by mouth at bedtime. 45 tablet 3   triazolam (HALCION) 0.25 MG tablet TAKE 1 TABLET (0.25 MG TOTAL) BY MOUTH AT BEDTIME AS NEEDED. 30 tablet 3   TURMERIC PO Take 10 mLs by mouth every evening.      XARELTO 20 MG TABS tablet TAKE 1 TABLET (20 MG TOTAL) BY MOUTH DAILY WITH SUPPER. 90 tablet 1   No current facility-administered medications for this visit.     Pertinent items are noted in HPI.   Review of Systems:  Review of Systems  Constitutional: Positive for malaise/fatigue.  HENT: Negative.   Eyes: Negative.   Respiratory: Positive for shortness of breath. Negative for cough, hemoptysis, sputum production and wheezing.   Cardiovascular: Positive for palpitations and orthopnea. Negative for chest pain, claudication, leg swelling and PND.  Gastrointestinal: Negative.   Genitourinary: Negative.   Musculoskeletal: Negative for joint pain.  Skin: Negative.   Neurological: Negative.   Endo/Heme/Allergies: Bruises/bleeds easily.  Psychiatric/Behavioral: Negative.       Physical Exam: BP 124/68 (BP Location: Right Arm, Patient Position: Sitting, Cuff Size: Large)    Pulse 94    Resp 18    Ht 6\' 5"  (1.956 m)    Wt 295 lb (133.8 kg)    SpO2 94% Comment: RA   BMI 34.98 kg/m   PHYSICAL EXAMINATION: General appearance: alert, cooperative and appears older than stated age Neck: no adenopathy, no carotid bruit, no  JVD, supple, symmetrical, trachea midline and thyroid not enlarged, symmetric, no tenderness/mass/nodules Lymph nodes: Cervical, supraclavicular, and axillary nodes normal. Resp: clear to auscultation bilaterally Back: symmetric, no curvature. ROM normal. No CVA tenderness. Cardio: irregularly irregular rhythm and diastolic murmur: mid diastolic 2/6, decrescendo at lower left sternal border GI: soft, non-tender; bowel sounds normal; no masses,  no organomegaly Extremities: varicose veins noted, venous stasis dermatitis noted and Patient has ulceration on left lower leg Neurologic: Grossly normal       Recent Radiology Findings:  Ct Angio Chest Aorta W &/or Wo Contrast  Result Date: 12/15/2018 CLINICAL DATA:  Follow-up TAA EXAM: CT ANGIOGRAPHY CHEST WITH CONTRAST TECHNIQUE: Multidetector CT imaging of the chest was performed using the standard protocol during bolus administration of intravenous contrast. Multiplanar CT image reconstructions and MIPs were obtained to evaluate the vascular anatomy. CONTRAST:  48mL ISOVUE-370 IOPAMIDOL (ISOVUE-370) INJECTION 76% COMPARISON:  11/04/2017, 02/26/2017 FINDINGS: Cardiovascular: Preferential opacification of the thoracic aorta. Unchanged enlargement of the tubular thoracic aorta, measuring 5.0 x 4.8 cm. The aortic root and sinuses of Valsalva are normal in caliber. Unchanged enlargement of the distal arch and descending thoracic aorta measuring 4.1 x 4.1 cm in the midportion of the descending aorta. Moderate mixed calcific atherosclerosis. Normal heart size. Left coronary artery calcifications. No pericardial effusion. Mediastinum/Nodes: No enlarged mediastinal, hilar, or axillary lymph nodes. Thyroid gland, trachea, and esophagus demonstrate no significant findings. Lungs/Pleura:  Scattered nonspecific ground-glass opacity of the superior segment right lower lobe. Stable, small benign pulmonary nodules, for example a 3 mm fissural nodule of the superior segment  left lower lobe (series 7, image 72). No pleural effusion or pneumothorax. Upper Abdomen: No acute abnormality. 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 No acute or significant osseous findings. Review of the MIP images confirms the above findings. IMPRESSION: 1. Unchanged enlargement of the tubular thoracic aorta, measuring 5.0 x 4.8 cm. The aortic root and sinuses of Valsalva are normal in caliber. Unchanged enlargement of the distal arch and descending thoracic aorta measuring 4.1 x 4.1 cm in the midportion of the descending aorta. These findings are not significantly changed when compared to examinations dating back to 02/26/2017 and measured similarly. Moderate mixed calcific atherosclerosis. 2.  Coronary artery disease. 3. Scattered nonspecific ground-glass opacity of the superior segment right lower lobe, likely infectious or inflammatory. 4. Stable, small benign pulmonary nodules, for example a 3 mm fissural nodule of the superior segment left lower lobe (series 7, image 72). Electronically Signed   By: Eddie Candle M.D.   On: 12/15/2018 11:04      I have independently reviewed the above radiology studies  and reviewed the findings with the patient.   Ct Angio Chest Aorta W/cm &/or Wo/cm  Result Date: 11/04/2017 CLINICAL DATA:  Thoracic aortic aneurysm EXAM: CT ANGIOGRAPHY CHEST WITH CONTRAST TECHNIQUE: Multidetector CT imaging of the chest was performed using the standard protocol during bolus administration of intravenous contrast. Multiplanar CT image reconstructions and MIPs were obtained to evaluate the vascular anatomy. CONTRAST:  23mL ISOVUE-370 IOPAMIDOL (ISOVUE-370) INJECTION 76% COMPARISON:  02/26/2017 FINDINGS: Cardiovascular: Maximal diameters of the ascending aorta at the sinus of Valsalva, sino-tubular junction, and ascending aorta are 3.9 cm, 3.8 cm, and 5.2 cm respectively. Based on my direct measurements on the prior study, this is not significantly  changed. There is no evidence of aortic dissection. There is no obvious intramural hematoma. Maximal diameter of the proximal descending thoracic aorta is 4.3 cm. The aortic arch is nonaneurysmal. Atherosclerotic calcifications in the aortic arch. Minimal atherosclerotic calcifications in the descending thoracic aorta. Great vessels are patent. Right subclavian artery is ectatic with a maximal diameter of 2.0 cm. Mild LAD territory coronary artery calcifications. No obvious acute pulmonary thromboembolism. Mediastinum/Nodes: No abnormal adenopathy. No pericardial effusion. Visualized thyroid is atrophic. Mild diffuse wall thickening of the esophagus. Lungs/Pleura: No pneumothorax. No pleural effusion. Pleural base nodule in the lateral right lower lobe on image 92 of series 6 measures 7 mm. This is stable. Upper Abdomen: No acute abnormality. Musculoskeletal: No vertebral compression deformity. Review of the MIP images confirms the above findings. IMPRESSION: Aneurysmal dilatation of the ascending aorta is 5.2 cm. Based on my direct measurements of the prior study, this has not significantly changed. There is no evidence of dissection. Mild aneurysmal dilatation of the descending thoracic aorta at 4.3 cm is also not significantly changed. Ascending thoracic aortic aneurysm. Recommend semi-annual imaging followup by CTA or MRA and referral to cardiothoracic surgery if not already obtained. This recommendation follows 2010 ACCF/AHA/AATS/ACR/ASA/SCA/SCAI/SIR/STS/SVM Guidelines for the Diagnosis and Management of Patients With Thoracic Aortic Disease. Circulation. 2010; 121: G920-F007 Aortic Atherosclerosis (ICD10-I70.0). Electronically Signed   By: Marybelle Killings M.D.   On: 11/04/2017 09:05   CLINICAL DATA:  Aortic root dilatation.  EXAM: CT ANGIOGRAPHY CHEST WITH CONTRAST  TECHNIQUE: Multidetector CT imaging of the chest was performed using the standard protocol during bolus administration of  intravenous contrast. Multiplanar CT image reconstructions and MIPs were obtained to evaluate the vascular anatomy.  CONTRAST:  100 mL of Isovue 370 intravenously.  COMPARISON:  None.  FINDINGS: Cardiovascular: Aortic root measures 3.9 cm in diameter. 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 Aneurysmal dilatation of descending thoracic aorta is noted with maximum measured diameter 4.1 cm. Aortic atherosclerosis is noted without dissection. Great vessels are widely patent without stenosis. No pericardial effusion is noted.  Mediastinum/Nodes: No enlarged mediastinal, hilar, or axillary lymph nodes. Thyroid gland, trachea, and esophagus demonstrate no significant findings.  Lungs/Pleura: Lungs are clear. No pleural effusion or pneumothorax.  Upper Abdomen: Probable fatty infiltration of the liver is noted.  Musculoskeletal: 8.6 x 5.3 cm fat containing lesion is seen within the right pectoralis muscle most consistent with intramuscular lipoma.  Review of the MIP images confirms the above findings.  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 Recommend semi-annual imaging followup by CTA or MRA and referral to cardiothoracic surgery if not already obtained. This recommendation follows 2010 ACCF/AHA/AATS/ACR/ASA/SCA/SCAI/SIR/STS/SVM Guidelines for the Diagnosis and Management of Patients With Thoracic Aortic Disease. Circulation. 2010; 121: V035-K093.  Probable fatty infiltration of the liver.  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  Aortic Atherosclerosis (ICD10-I70.0).   Electronically Signed   By: Marijo Conception, M.D.   On: 02/26/2017 11:57  I have independently reviewed the above radiologic studies.  Recent Lab Findings: Lab Results  Component Value Date   WBC 8.2 08/18/2018   HGB 15.5 08/18/2018   HCT 47.6 08/18/2018   PLT 214 08/18/2018    GLUCOSE 134 (H) 10/20/2018   CHOL 110 06/01/2018   TRIG 137.0 06/01/2018   HDL 34.30 (L) 06/01/2018   LDLDIRECT 120.0 05/11/2016   LDLCALC 48 06/01/2018   ALT 21 06/01/2018   AST 15 06/01/2018   NA 141 10/20/2018   K 4.6 10/20/2018   CL 107 10/20/2018   CREATININE 1.01 10/20/2018   BUN 19 10/20/2018   CO2 22 10/20/2018   TSH 5.35 (H) 10/20/2018   HGBA1C 7.3 (H) 10/20/2018   ECHO: Echocardiography  Patient:    Clinton Black, Clinton Black MR #:       [id number] Study Date: 03/29/2018 Gender:     M Age:        68 Height:     195.6 cm Weight:     127 kg BSA:        2.66 m^2 Pt. Status: Room:   ATTENDING    Lanelle Bal MD  Carnuel MD  REFERRING    Lanelle Bal MD  SONOGRAPHER  Cindy Hazy, RDCS  PERFORMING   [facility], Outpatient  cc:  ------------------------------------------------------------------- LV EF: 50% -   55%  ------------------------------------------------------------------- Indications:      I35.1 Aortic Insufficiency.  ------------------------------------------------------------------- History:   PMH:  Acquired from the patient and from the patient&'s chart.  PMH:  Thoracic Aortic Aneurysm.  Risk factors: Hypertension. Diabetes mellitus. Obese. Dyslipidemia.  ------------------------------------------------------------------- Study Conclusions  - Left ventricle: The cavity size was normal. There was moderate   basal hypertrophy of the septum with otherwise mild concentric   hypertrophy. Systolic function was normal. The estimated ejection   fraction was in the range of 50% to 55%. Wall motion was normal;   there were no regional wall motion abnormalities. Doppler   parameters are consistent with abnormal left ventricular   relaxation (grade 1 diastolic  dysfunction). - Aortic valve: Transvalvular velocity was within the normal range.   There was no stenosis. There was moderate regurgitation. - Aorta: Ascending aortic  diameter: 50 mm (S). 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 - Mitral valve: Transvalvular velocity was within the normal range.   There was no evidence for stenosis. There was trivial   regurgitation. - Left atrium: The atrium was moderately dilated. - Right ventricle: The cavity size was normal. Wall thickness was   normal. Systolic function was normal. - Pulmonary arteries: Systolic pressure was within the normal   range. PA peak pressure: 24 mm Hg (S).  Impressions:  - Compared with the echo 05/2017, the ascending aorta aneurysm has   increased from 4.7 cm to 5.0 cm.  ------------------------------------------------------------------- Study data:   Study status:  Routine.  Procedure:  The patient reported no pain pre or post test. Transthoracic echocardiography for left ventricular function evaluation, for right ventricular function evaluation, and for assessment of valvular function. Image quality was adequate.  Study completion:  There were no complications.          Echocardiography.  M-mode, complete 2D, spectral Doppler, and color Doppler.  Birthdate:  Patient birthdate: 23-Jun-1945.  Age:  Patient is 73 yr old.  Sex:  Gender: male.    BMI: 33.2 kg/m^2.  Blood pressure:     118/74  Patient status:  Outpatient.  Study date:  Study date: 03/29/2018. Study time: 04:07 PM.  Location:  [facility]  -------------------------------------------------------------------  ------------------------------------------------------------------- Left ventricle:  The cavity size was normal. There was moderate basal hypertrophy of the septum with otherwise mild concentric hypertrophy. Systolic function was normal. The estimated ejection fraction was in the range of 50% to 55%. Wall motion was normal; there were no regional wall motion abnormalities. Doppler parameters are consistent with abnormal left ventricular relaxation (grade 1 diastolic  dysfunction).  ------------------------------------------------------------------- Aortic valve:   Trileaflet; normal thickness leaflets. Mobility was not restricted.  Doppler:  Transvalvular velocity was within the normal range. There was no stenosis. There was moderate regurgitation.    VTI ratio of LVOT to aortic valve: 0.64. Valve area (VTI): 2.66 cm^2. Indexed valve area (VTI): 1 cm^2/m^2. Peak velocity ratio of LVOT to aortic valve: 0.66. Valve area (Vmax): 2.74 cm^2. Indexed valve area (Vmax): 1.03 cm^2/m^2. Mean velocity ratio of LVOT to aortic valve: 0.71. Valve area (Vmean): 2.93 cm^2. Indexed valve area (Vmean): 1.1 cm^2/m^2.    Mean gradient (S): 8 mm Hg. Peak gradient (S): 15 mm Hg.  ------------------------------------------------------------------- Aorta:  Aortic root: The aortic root was normal in size. Ascending aorta: The ascending aorta was severely dilated.  ------------------------------------------------------------------- Mitral valve:   Structurally normal valve.   Mobility was not restricted.  Doppler:  Transvalvular velocity was within the normal range. There was no evidence for stenosis. There was trivial regurgitation.  ------------------------------------------------------------------- Left atrium:  The atrium was moderately dilated.  ------------------------------------------------------------------- Right ventricle:  The cavity size was normal. Wall thickness was normal. Systolic function was normal.  ------------------------------------------------------------------- Pulmonic valve:    Structurally normal valve.   Cusp separation was normal.  Doppler:  Transvalvular velocity was within the normal range. There was no evidence for stenosis. There was no regurgitation.  ------------------------------------------------------------------- Tricuspid valve:   Structurally normal valve.    Doppler: Transvalvular velocity was within the normal  range. There was trivial regurgitation.  ------------------------------------------------------------------- Pulmonary artery:   The main pulmonary artery was normal-sized. Systolic pressure was within the normal range.  ------------------------------------------------------------------- Right  atrium:  The atrium was normal in size.  ------------------------------------------------------------------- Pericardium:  There was no pericardial effusion.  ------------------------------------------------------------------- Systemic veins: Inferior vena cava: The vessel was dilated. The respirophasic diameter changes were in the normal range (>= 50%), consistent with elevated central venous pressure.  ------------------------------------------------------------------- Measurements   Left ventricle                           Value          Reference  LV ID, ED, PLAX chordal          (H)     59    mm       43 - 52  LV ID, ES, PLAX chordal          (H)     45    mm       23 - 38  LV fx shortening, PLAX chordal   (L)     24    %        >=29  LV PW thickness, ED                      11    mm       ----------  IVS/LV PW ratio, ED              (H)     1.36           <=1.3  Stroke volume, 2D                        134   ml       ----------  Stroke volume/bsa, 2D                    50    ml/m^2   ----------  LV e&', lateral                           7.29  cm/s     ----------  LV E/e&', lateral                         7.28           ----------  LV e&', medial                            6.31  cm/s     ----------  LV E/e&', medial                          8.42           ----------  LV e&', average                           6.8   cm/s     ----------  LV E/e&', average                         7.81           ----------    Ventricular septum                       Value          Reference  IVS thickness, ED                        15    mm       ----------    LVOT                                      Value          Reference  LVOT ID, S                               23    mm       ----------  LVOT area                                4.15  cm^2     ----------  LVOT ID                                  23    mm       ----------  LVOT peak velocity, S                    126   cm/s     ----------  LVOT mean velocity, S                    95.4  cm/s     ----------  LVOT VTI, S                              32.2  cm       ----------  LVOT peak gradient, S                    6     mm Hg    ----------  Stroke volume (SV), LVOT DP              133.8 ml       ----------  Stroke index (SV/bsa), LVOT DP           50.3  ml/m^2   ----------    Aortic valve                             Value          Reference  Aortic valve peak velocity, S            191   cm/s     ----------  Aortic valve mean velocity, S            135   cm/s     ----------  Aortic valve VTI, S                      50.3  cm       ----------  Aortic mean gradient, S                  8     mm Hg    ----------  Aortic peak gradient, S  15    mm Hg    ----------  VTI ratio, LVOT/AV                       0.64           ----------  Aortic valve area, VTI                   2.66  cm^2     ----------  Aortic valve area/bsa, VTI               1     cm^2/m^2 ----------  Velocity ratio, peak, LVOT/AV            0.66           ----------  Aortic valve area, peak velocity         2.74  cm^2     ----------  Aortic valve area/bsa, peak              1.03  cm^2/m^2 ----------  velocity  Velocity ratio, mean, LVOT/AV            0.71           ----------  Aortic valve area, mean velocity         2.93  cm^2     ----------  Aortic valve area/bsa, mean              1.1   cm^2/m^2 ----------  velocity  Aortic regurg pressure half-time         736   ms       ----------    Aorta                                    Value          Reference  Aortic root ID, ED                       46    mm       ----------  Ascending aorta ID, A-P, S                50    mm       ----------    Left atrium                              Value          Reference  LA ID, A-P, ES                           43    mm       ----------  LA ID/bsa, A-P                           1.62  cm/m^2   <=2.2  LA volume, ES, 1-p A4C                   42.1  ml       ----------  LA volume/bsa, ES, 1-p A4C               15.8  ml/m^2   ----------  LA volume, ES, 1-p A2C  86.3  ml       ----------  LA volume/bsa, ES, 1-p A2C               32.5  ml/m^2   ----------    Mitral valve                             Value          Reference  Mitral E-wave peak velocity              53.1  cm/s     ----------  Mitral A-wave peak velocity              85.7  cm/s     ----------  Mitral deceleration time         (H)     271   ms       150 - 230  Mitral E/A ratio, peak                   0.6            ----------    Pulmonary arteries                       Value          Reference  PA pressure, S, DP                       24    mm Hg    <=30    Tricuspid valve                          Value          Reference  Tricuspid regurg peak velocity           203   cm/s     ----------  Tricuspid peak RV-RA gradient            16    mm Hg    ----------    Right atrium                             Value          Reference  RA ID, S-I, ES, A4C                      44.5  mm       34 - 49  RA area, ES, A4C                         13.7  cm^2     8.3 - 19.5  RA volume, ES, A/L                       35    ml       ----------  RA volume/bsa, ES, A/L                   13.2  ml/m^2   ----------    Systemic veins                           Value          Reference  Estimated CVP  8     mm Hg    ----------    Right ventricle                          Value          Reference  RV ID, minor axis, ED, A4C base          41    mm       ----------  TAPSE                                    25.3  mm       ----------  RV pressure, S, DP                       24    mm  Hg    <=30  RV s&', lateral, S                        11.6  cm/s     ----------  Legend: (L)  and  (H)  mark values outside specified reference range.  ------------------------------------------------------------------- Prepared and Electronically Authenticated by  Skeet Latch, MD 2019-10-15T19:02:53  Study Highlights    The left ventricular ejection fraction is moderately decreased (30-44%).  Nuclear stress EF: 39%.  There was no ST segment deviation noted during stress.  No T wave inversion was noted during stress.  This is an intermediate risk study due to reduced systolic function.  There is no ischemia.    Nuclear History and Indications   History and Indications Indication for Stress Test: Diagnosis of coronary disease History: Hx Acute Bronchitis; No prior cardiac history reported; No prior NUC MPI for comparison. Cardiac Risk Factors: Family History - CAD, History of Smoking, Hypertension, Lipids and Obesity  Symptoms: Dizziness, DOE, Fatigue and SOB  Stress Findings   ECG Baseline ECG exhibits normal sinus rhythm..  Stress Findings A pharmacological stress test was performed using IV Lexiscan 0.4mg  over 10 seconds performed without concurrent submaximal exercise.  The patient reported shortness of breath during the stress test.   Test was stopped per protocol.  Response to Stress There was no ST segment deviation noted during stress.  No T wave inversion was noted during stress. Arrhythmias during stress: frequent PVCs.  Arrhythmias during recovery: none.  Arrhythmias were not significant.  ECG was interpretable and there was no significant change from baseline.  Stress Measurements   Baseline Vitals  Rest HR 63 bpm    Rest BP 141/67 mmHg    Peak Stress Vitals  Peak HR 74 bpm    Peak BP 136/69 mmHg       Nuclear Stress Measurements   LV sys vol 143 mL    TID 0.98     LV dias vol 234 mL    SSS 0     SRS 0     SDS 0           Nuclear Stress Findings   Isotope administration Rest isotope was administered with an IV injection of 31.3 mCi technetium tetrofosmin. Rest SPECT images were obtained approximately 45 minutes post tracer injection. Stress isotope was administered with an IV injection of 29.6 mCi technetium tetrofosmin 20 seconds post IV Lexiscan administration. Stress SPECT images were obtained approximately 60 minutes post tracer injection.  Nuclear Study Quality Overall  image quality is good.  Nuclear Measurements Study was gated.  Rest Perfusion Rest perfusion normal.  Stress Perfusion Stress perfusion normal.  Overall Study Impression Myocardial perfusion is normal. This is an intermediate risk study. Overall left ventricular systolic function was abnormal. LV cavity size is moderately enlarged. Nuclear stress EF: 39%. The left ventricular ejection fraction is moderately decreased (30-44%). There is no prior study for comparison.  From: ACCF/SCAI/STS/AATS/AHA/ASNC/HFSA/SCCT 2012 Appropriate Use Criteria for Coronary Revascularization Focused Update  Wall Scoring   Score Index: 2.000 Percent Normal: 0.0%          The left ventricular wall motion is globally hypokinetic.          Signed   Electronically signed by Skeet Latch, MD on 02/16/17 at [facility]  Report approved and finalized on 02/16/2017 1918   Aortic Size Index=  5.0      /Body surface area is 2.7 meters squared. 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  #2  Moderate risk nuclear stress test done by cardiology last year-not further evaluated  #3 atrial fibrillation with poor  rate control per the patient symptoms and his watch, he notes rates between 50 and 180,   I have asked the patient to have a follow-up appointment in the atrial fibrillation clinic to adequately monitor his rate control.  He has a routine cardiology appointment in September we will asked their office to move this up and consider repeat echocardiogram.    Plan to see the patient back in 6 months with a follow-up CTA of the chest, will leave to cardiology to obtain a follow-up echo cardiogram   The patient's symptoms have worsened with onset of atrial fibrillation.  He was cautioned against strenuous lifting and Valsalva maneuver He was again cautioned about use of Cipro He was encouraged to have regular dental checkups   [name] MD      [address] [place] [address] Office [phone]   Beeper [phone]  12/15/2018 11:31 AM

## 2018-12-15 ENCOUNTER — Telehealth: Payer: Self-pay | Admitting: Cardiology

## 2018-12-15 ENCOUNTER — Encounter: Payer: Self-pay | Admitting: Cardiothoracic Surgery

## 2018-12-15 ENCOUNTER — Ambulatory Visit: Payer: Medicare HMO | Admitting: Cardiothoracic Surgery

## 2018-12-15 ENCOUNTER — Ambulatory Visit
Admission: RE | Admit: 2018-12-15 | Discharge: 2018-12-15 | Disposition: A | Discharge status: Home / Self Care | Payer: Medicare HMO | Source: Ambulatory Visit | Attending: Cardiothoracic Surgery | Admitting: Cardiothoracic Surgery

## 2018-12-15 VITALS — BP 124/68 | HR 94 | Resp 18 | Ht 77.0 in | Wt 295.0 lb

## 2018-12-15 DIAGNOSIS — I712 Thoracic aortic aneurysm, without rupture, unspecified: Secondary | ICD-10-CM

## 2018-12-15 DIAGNOSIS — I2510 Atherosclerotic heart disease of native coronary artery without angina pectoris: Secondary | ICD-10-CM | POA: Diagnosis not present

## 2018-12-15 IMAGING — CT CT ANGIOGRAPHY CHEST
2 of 6 series · 13 of 36 positions shown · IV contrast (iopamidol)
Comparison: [DATE], [DATE]

CLINICAL DATA: Follow-up [facility]

EXAM:
CT ANGIOGRAPHY CHEST WITH CONTRAST
TECHNIQUE: Multidetector CT imaging of the chest was performed using the
standard protocol during bolus administration of intravenous
contrast. Multiplanar CT image reconstructions and MIPs were
obtained to evaluate the vascular anatomy.
CONTRAST:  75mL [CS] IOPAMIDOL ([CS]) INJECTION 76%

[Series 5: cta thorax 2.00 bv36 s3 axial arterial · axial · arterial · 0.73mm/px · z∈[+1484,+1772]mm · 12 of 172 slices shown]
[im 14/172  lung]
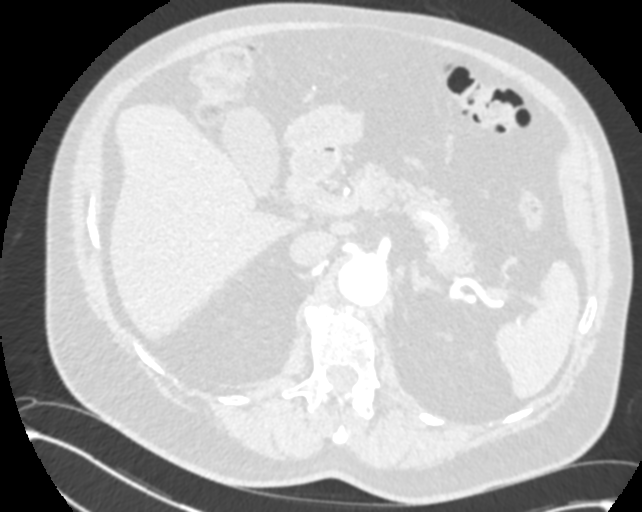
[im 27/172  mediastinal]
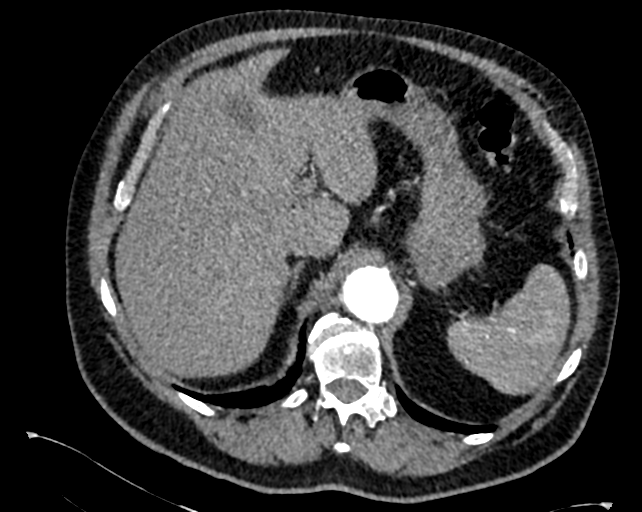
[im 40/172  lung]
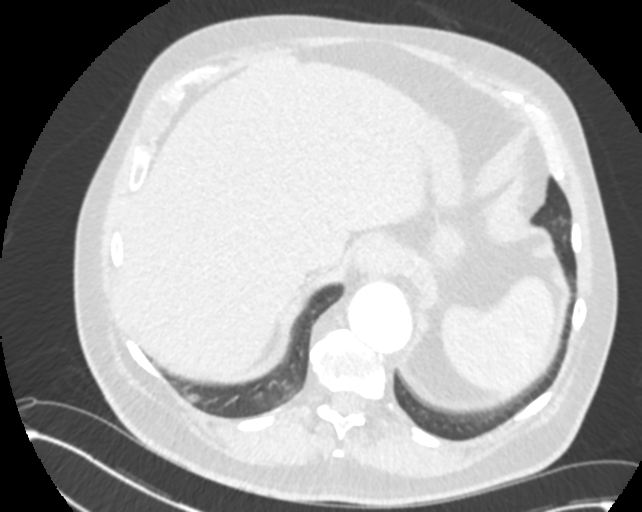
[im 53/172  mediastinal]
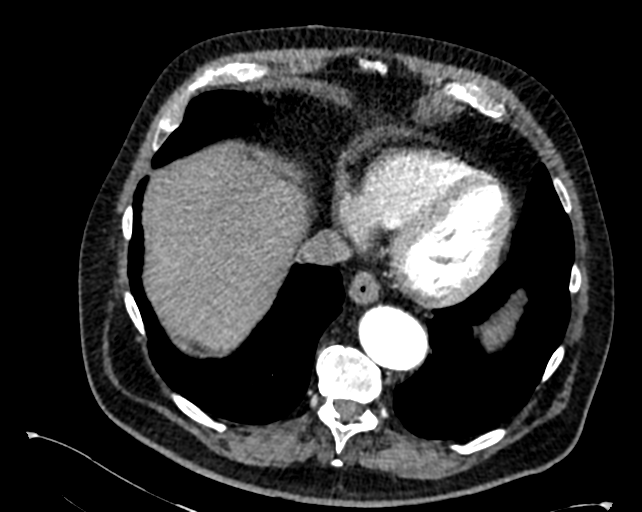
[im 66/172  lung]
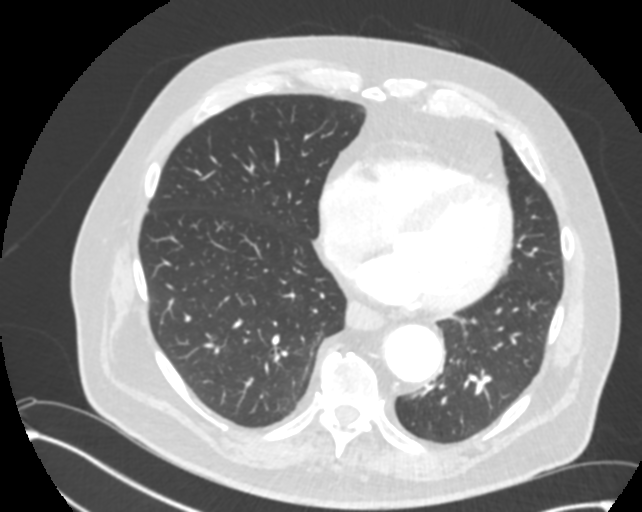
[im 79/172  mediastinal]
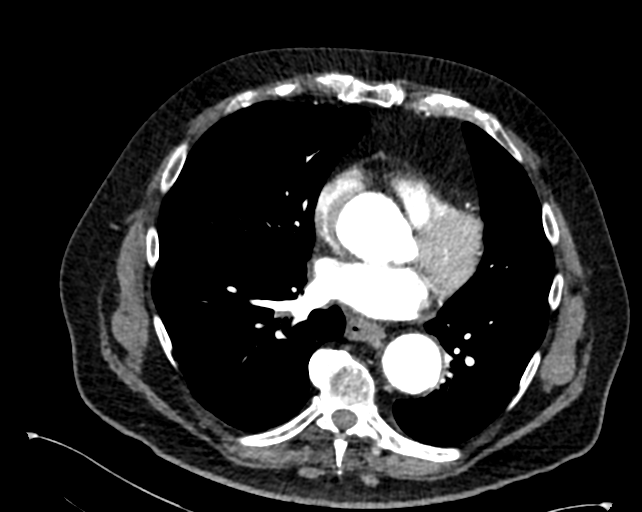
[im 93/172  lung]
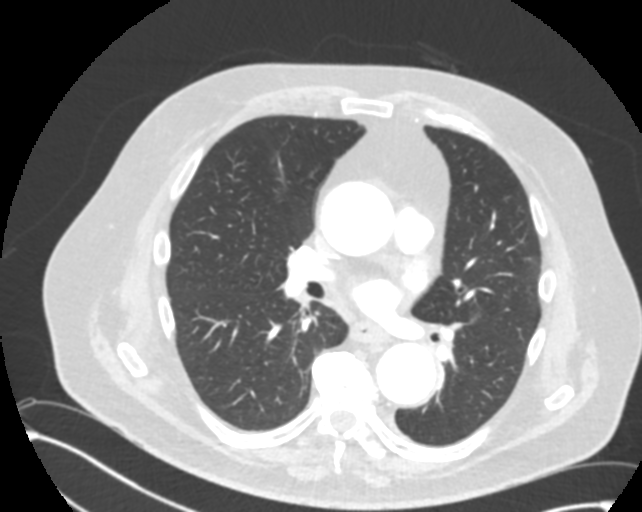
[im 106/172  mediastinal]
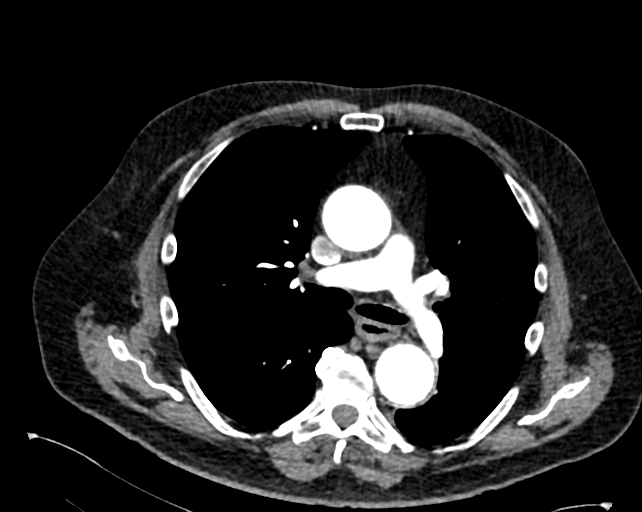
[im 119/172  lung]
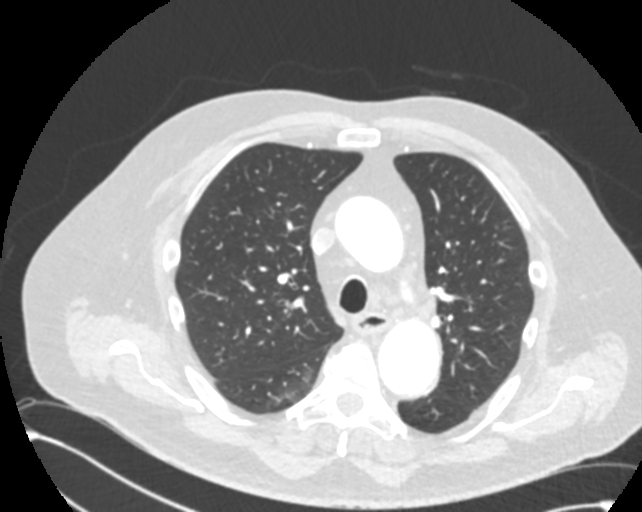
[im 132/172  mediastinal]
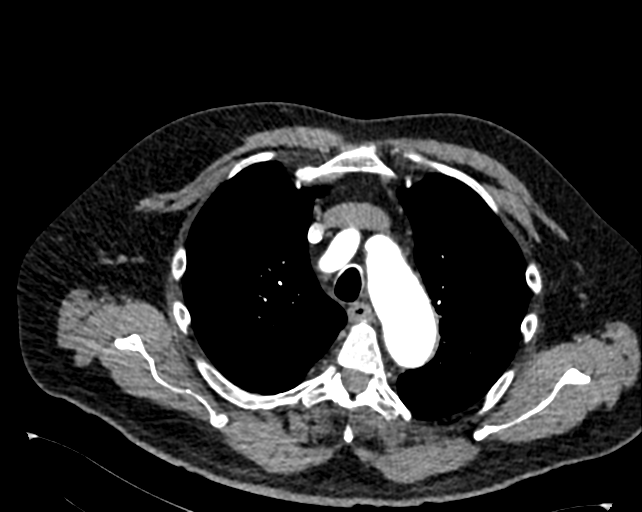
[im 145/172  lung]
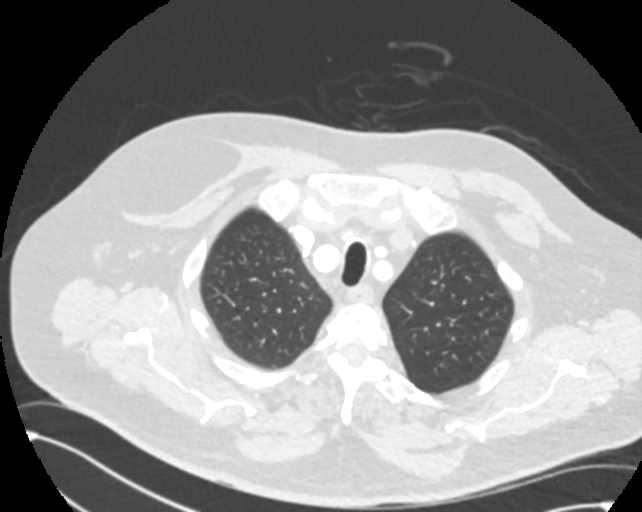
[im 158/172  mediastinal]
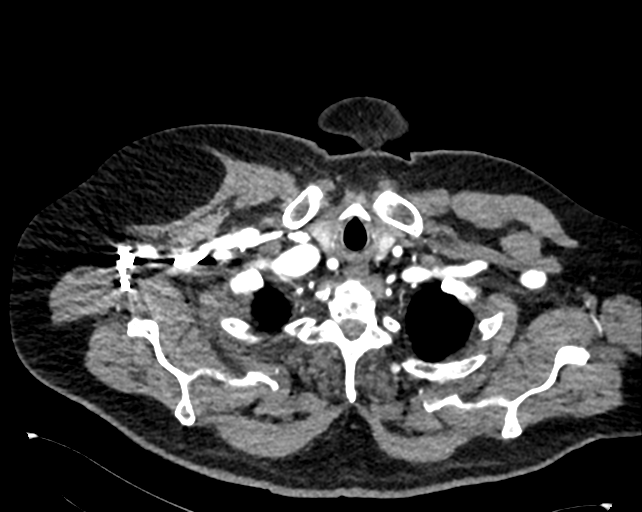

[Series 10: cta thorax 2.00 bv36 s3 cor st · coronal · 0.67mm/px · 1 of 186 slices shown]
[im 93/186  mediastinal]
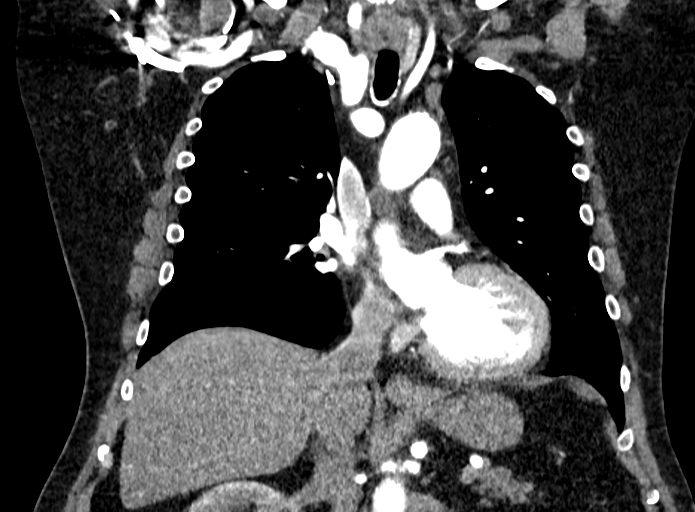

[13 of 36 positions shown; findings below may reference images not displayed]

FINDINGS: Cardiovascular: Preferential opacification of the thoracic aorta.
Unchanged enlargement of the tubular thoracic aorta, measuring 5.0 x
4.8 cm. The aortic root and sinuses of Valsalva are normal in
caliber. Unchanged enlargement of the distal arch and descending
thoracic aorta measuring 4.1 x 4.1 cm in the midportion of the
descending aorta. Moderate mixed calcific atherosclerosis. Normal
heart size. Left coronary artery calcifications. No pericardial
effusion.

Mediastinum/Nodes: No enlarged mediastinal, hilar, or axillary lymph
nodes. Thyroid gland, trachea, and esophagus demonstrate no
significant findings.

Lungs/Pleura: Scattered nonspecific ground-glass opacity of the
superior segment right lower lobe. Stable, small benign pulmonary
nodules, for example a 3 mm fissural nodule of the superior segment
left lower lobe (series 7, image 72). No pleural effusion or
pneumothorax.

Upper Abdomen: No acute abnormality.

Musculoskeletal: Large lipoma of the right pectoralis major muscle.
No acute or significant osseous findings.

Review of the MIP images confirms the above findings.
IMPRESSION: 1. Unchanged enlargement of the tubular thoracic aorta, measuring
5.0 x 4.8 cm. The aortic root and sinuses of Valsalva are normal in
caliber. Unchanged enlargement of the distal arch and descending
thoracic aorta measuring 4.1 x 4.1 cm in the midportion of the
descending aorta. These findings are not significantly changed when
compared to examinations dating back to [DATE] and measured
similarly. Moderate mixed calcific atherosclerosis.

2.  Coronary artery disease.

3. Scattered nonspecific ground-glass opacity of the superior
segment right lower lobe, likely infectious or inflammatory.

4. Stable, small benign pulmonary nodules, for example a 3 mm
fissural nodule of the superior segment left lower lobe (series 7,
image 72).

## 2018-12-15 MED ORDER — IOPAMIDOL (ISOVUE-370) INJECTION 76%
75.0000 mL | Freq: Once | INTRAVENOUS | Status: AC | PRN
  Administered 2018-12-15: 75 mL via INTRAVENOUS

## 2018-12-15 NOTE — Telephone Encounter (Signed)
Spoke to [place] at [place] office.She stated Dr.Gerhardt wanted patient seen before Sept.Advised I will schedule patient appointment sooner.

## 2018-12-15 NOTE — Telephone Encounter (Signed)
Spoke to patient appointment scheduled with Dr.Jordan Mon 7/6 at 1:20 pm.

## 2018-12-15 NOTE — Telephone Encounter (Signed)
New Message              Dr. Everrett [facility] office is calling to get a sooner appointment, sooner than Sept. I spoke with Delsa Sale and they would like a call back concerning this matter. Pls advise

## 2018-12-15 NOTE — Telephone Encounter (Signed)
Returned call to [place] at [place] office not in office at present was told to call back in 1 hour. Called patient no answer.[place] to schedule sooner appointment with Dr.Jordan.

## 2018-12-15 NOTE — Telephone Encounter (Signed)
Sent to primary nurse 

## 2018-12-17 NOTE — Progress Notes (Signed)
Cardiology Office Note   Date:  12/19/2018   ID:  Niels, Cranshaw Aug 28, 1945, MRN [id number]  PCP:  [name], MD  Cardiologist:    [name], MD   Chief Complaint  Patient presents with  . Shortness of Breath  . Atrial Fibrillation      History of Present Illness: Clinton Black is a 73 y.o. male who is seen for follow up atrial fibrillation. He has a history of thoracic aortic aneurysm.  He has a history of DM type 2, morbid obesity, HLD, and HTN.   In 2018 he was experiencing dyspnea, often awakening him at night. He states that since he stopped smoking marijuana this has largely resolved.  He was seen by Dr. Ronnald Ramp and noted to have frequent ectopy. Ecg showed PVC couplets. He was hypertensive and started on Edarbi.  Denied any chest pain or syncope. No edema. Quit smoking in 1996. Has lost 25-30 lbs this past year. He does have a family history of CAD.   On  his initial visit he underwent evaluation with an Echocardiogram that showed "normal" LV function with moderate AI and dilated aortic root at 44 mm. On my review I felt his EF was mildly impaired at 45%. Myoview study showed a fixed inferobasal defect, no ischemia. EF 39%. He was started on Toprol XL and a CT of the aorta was ordered. 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   He was seen in December 2018 with increased PND. Started on aldactone with clinical improvement. Also on metoprolol and losartan. Repeat Echo done and was stable. Reported low normal EF and only "mild" AI.   Echo in October 2019 showed EF 50-55%. Diastolic dysfunction. Aortic dimension 5 cm. Moderate AI.   In November 2019  he had cystoscopy with implantation of Urolift devices. This was without complication and no arrhythmia reported.  Seen by Dr. Servando Snare on Dec 6 and HR recorded at 58. When seen by Dr Ronnald Ramp found to be in Afib with rate 91. He was started on Xarelto. On our  evaluation he was in persistent AFib. Rate controlled on metoprolol.   On 07/12/18 he underwent successful DCCV. One week later he felt like he was out of rhythm. He did note that when he was in NSR he felt much better with decreased SOB and improved exercise duration and energy. He was seen in the Afib clinic and loaded with amiodarone. Repeat attempt at [place] on March 9 was unsuccessful. When seen back in the Afib clinic amiodarone was discontinued. Options discussed included Tikosyn once amiodarone had washed out or MAZE procedure if surgery needed. Maintained on rate control and anticoagulation. He was seen in the ED on March 20 with facial droop due to Bell's palsy. Cranial MRI negative for CVA.   He was seen by Dr Servando Snare on July 2. Noted continued functional decline and dyspnea. Noted HR varied between 50-180 bpm by patient report. CT showed stable thoracic aneurysm 4.8 cm.   On follow up today he complains that Afib is "wearing me out". SOB with any exertion. Feels fatigued and weak. HR controlled at rest but goes up to 150s with exertion by Apple watch. He notes that after his last DCCV he developed a Bell's palsy that week. Those symptoms have resolved. He stopped amiodarone on 08/29/18.   Past Medical History:  Diagnosis Date  . Arthritis   . BPH (benign prostatic  hyperplasia)   . Diabetes mellitus type 2 in obese (HCC)    diet controlled  . HTN (hypertension)   . Hyperlipidemia, mixed   . Insomnia   . Obesity   . Thoracic aortic aneurysm, without rupture (HCC)    4.7 cm per chest ct with contrast 11-04-17    Past Surgical History:  Procedure Laterality Date  . CARDIOVERSION N/A 07/12/2018   Procedure: CARDIOVERSION;  Surgeon: Acie Fredrickson Wonda Cheng, MD;  Location: [facility] ENDOSCOPY;  Service: Cardiovascular;  Laterality: N/A;  . CARDIOVERSION N/A 08/22/2018   Procedure: CARDIOVERSION;  Surgeon: Skeet Latch, MD;  Location: [place];  Service: Cardiovascular;  Laterality: N/A;  .  COLONOSCOPY  2016  . CYST EXCISION     polynomial  . CYSTOSCOPY WITH INSERTION OF UROLIFT N/A 04/18/2018   Procedure: CYSTOSCOPY WITH INSERTION OF UROLIFT;  Surgeon: Cleon Gustin, MD;  Location: [facility];  Service: Urology;  Laterality: N/A;  . KNEE SURGERY Right 2000   arthroscopy  . TONSILLECTOMY    . widson teeth extraction       Current Outpatient Medications  Medication Sig Dispense Refill  . atorvastatin (LIPITOR) 20 MG tablet Take 1 tablet (20 mg total) by mouth daily. (Patient taking differently: Take 20 mg by mouth every evening. ) 90 tablet 1  . Empagliflozin-metFORMIN HCl 5-500 MG TABS Take 1 tablet by mouth 2 (two) times a day. 180 tablet 0  . metoprolol succinate (TOPROL-XL) 25 MG 24 hr tablet TAKE 1 TABLET BY MOUTH EVERY DAY 90 tablet 2  . spironolactone (ALDACTONE) 25 MG tablet Take 0.5 tablets (12.5 mg total) by mouth at bedtime. 45 tablet 3  . triazolam (HALCION) 0.25 MG tablet TAKE 1 TABLET (0.25 MG TOTAL) BY MOUTH AT BEDTIME AS NEEDED. 30 tablet 3  . TURMERIC PO Take 10 mLs by mouth every evening.     Alveda Reasons 20 MG TABS tablet TAKE 1 TABLET (20 MG TOTAL) BY MOUTH DAILY WITH SUPPER. 90 tablet 1   No current facility-administered medications for this visit.     Allergies:   Quinolones    Social History:  The patient  reports that he quit smoking about 24 years ago. His smoking use included cigarettes. He has a 7.50 pack-year smoking history. He has never used smokeless tobacco. He reports current alcohol use of about 14.0 standard drinks of alcohol per week. He reports current drug use. Drug: Marijuana.   Family History:  The patient's family history includes Aneurysm in his father; Arthritis in his mother; Heart disease in his father; Macular degeneration in his mother; Varicose Veins in his mother.    ROS:  Please see the history of present illness.   Otherwise, review of systems are positive for none.   All other systems are reviewed and  negative.    PHYSICAL EXAM: VS:  BP 123/75   Pulse 92   Ht 6\' 5"  (1.956 m)   Wt 294 lb (133.4 kg)   BMI 34.86 kg/m  , BMI Body mass index is 34.86 kg/m. GENERAL:  Well appearing obese WM in NAD HEENT:  PERRL, EOMI, sclera are clear. Oropharynx is clear. NECK:  No jugular venous distention, carotid upstroke brisk and symmetric, no bruits, no thyromegaly or adenopathy LUNGS:  Clear to auscultation bilaterally CHEST:  Unremarkable HEART:  IRRR,  PMI not displaced or sustained,S1 and S2 within normal limits, no S3, no S4: no clicks, no rubs, no audible murmurs ABD:  Soft, nontender. BS +, no masses or  bruits. No hepatomegaly, no splenomegaly EXT:  2 + pulses throughout, no edema, no cyanosis no clubbing SKIN:  Warm and dry.  No rashes NEURO:  Alert and oriented x 3. Cranial nerves II through XII intact. PSYCH:  Cognitively intact  EKG:  EKG is ordered today.Afib rate 92.  T wave inversion  inferiorly. QTc 445 msec.I have personally reviewed and interpreted this study.   Recent Labs: 06/01/2018: ALT 21 08/18/2018: Hemoglobin 15.5; Platelets 214 10/20/2018: BUN 19; Creatinine, Ser 1.01; Potassium 4.6; Sodium 141; TSH 5.35    Lipid Panel    Component Value Date/Time   CHOL 110 06/01/2018 1328   CHOL 105 03/11/2017 1541   TRIG 137.0 06/01/2018 1328   HDL 34.30 (L) 06/01/2018 1328   HDL 36 (L) 03/11/2017 1541   CHOLHDL 3 06/01/2018 1328   VLDL 27.4 06/01/2018 1328   LDLCALC 48 06/01/2018 1328   LDLCALC 46 03/11/2017 1541   LDLDIRECT 120.0 05/11/2016 1433    dated 07/15/17: A1c 6.8%.  Wt Readings from Last 3 Encounters:  12/19/18 294 lb (133.4 kg)  12/15/18 295 lb (133.8 kg)  10/20/18 291 lb 4 oz (132.1 kg)      Other studies Reviewed: Additional studies/ records that were reviewed today include:    Myoview 02/12/17: Study Highlights    The left ventricular ejection fraction is moderately decreased (30-44%).  Nuclear stress EF: 39%.  There was no ST segment deviation  noted during stress.  No T wave inversion was noted during stress.  This is an intermediate risk study due to reduced systolic function.  There is no ischemia.    Echo 03/29/18: Study Conclusions  - Left ventricle: The cavity size was normal. There was moderate   basal hypertrophy of the septum with otherwise mild concentric   hypertrophy. Systolic function was normal. The estimated ejection   fraction was in the range of 50% to 55%. Wall motion was normal;   there were no regional wall motion abnormalities. Doppler   parameters are consistent with abnormal left ventricular   relaxation (grade 1 diastolic dysfunction). - Aortic valve: Transvalvular velocity was within the normal range.   There was no stenosis. There was moderate regurgitation. - Aorta: Ascending aortic diameter: 50 mm (S). 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 - Mitral valve: Transvalvular velocity was within the normal range.   There was no evidence for stenosis. There was trivial   regurgitation. - Left atrium: The atrium was moderately dilated. - Right ventricle: The cavity size was normal. Wall thickness was   normal. Systolic function was normal. - Pulmonary arteries: Systolic pressure was within the normal   range. PA peak pressure: 24 mm Hg (S).  Impressions:  - Compared with the echo 05/2017, the ascending aorta aneurysm has   increased from 4.7 cm to 5.0 cm.  CT ANGIOGRAPHY CHEST WITH CONTRAST  TECHNIQUE: Multidetector CT imaging of the chest was performed using the standard protocol during bolus administration of intravenous contrast. Multiplanar CT image reconstructions and MIPs were obtained to evaluate the vascular anatomy.  CONTRAST:  58mL ISOVUE-370 IOPAMIDOL (ISOVUE-370) INJECTION 76%  COMPARISON:  11/04/2017, 02/26/2017  FINDINGS: Cardiovascular: Preferential opacification of the thoracic aorta. Unchanged enlargement of the tubular thoracic aorta,  measuring 5.0 x 4.8 cm. The aortic root and sinuses of Valsalva are normal in caliber. Unchanged enlargement of the distal arch and descending thoracic aorta measuring 4.1 x 4.1 cm in the midportion of the descending aorta. Moderate mixed calcific atherosclerosis. Normal heart size.  Left coronary artery calcifications. No pericardial effusion.  Mediastinum/Nodes: No enlarged mediastinal, hilar, or axillary lymph nodes. Thyroid gland, trachea, and esophagus demonstrate no significant findings.  Lungs/Pleura: Scattered nonspecific ground-glass opacity of the superior segment right lower lobe. Stable, small benign pulmonary nodules, for example a 3 mm fissural nodule of the superior segment left lower lobe (series 7, image 72). No pleural effusion or pneumothorax.  Upper Abdomen: No acute abnormality.  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 No acute or significant osseous findings.  Review of the MIP images confirms the above findings.  IMPRESSION: 1. Unchanged enlargement of the tubular thoracic aorta, measuring 5.0 x 4.8 cm. The aortic root and sinuses of Valsalva are normal in caliber. Unchanged enlargement of the distal arch and descending thoracic aorta measuring 4.1 x 4.1 cm in the midportion of the descending aorta. These findings are not significantly changed when compared to examinations dating back to 02/26/2017 and measured similarly. Moderate mixed calcific atherosclerosis.  2.  Coronary artery disease.  3. Scattered nonspecific ground-glass opacity of the superior segment right lower lobe, likely infectious or inflammatory.  4. Stable, small benign pulmonary nodules, for example a 3 mm fissural nodule of the superior segment left lower lobe (series 7, image 72).   Electronically Signed   By: Eddie Candle M.D.   On: 12/15/2018 11:04   ASSESSMENT AND PLAN: 1. Thoracic aortic aneurysm. 4.7-4.8 cm by most recent  evaluation. Followed by Dr. Servando Snare with CT surgery. He has moderate AI. Plan to repeat CT in 6 months. Will update Echo. Continue aggressive BP control. Continue Toprol XL 25 mg daily and losartan 50 mg daily.   Recommend avoiding heavy lifting or staining.  2. 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 Need to update Echo.   3. Chronic combined systolic/ diastolic CHF. Exacerbated by Afib. Continue metoprolol, losartan and aldactone.  4. Afib new onset in December and persistent. S/p DCCV with early return of Afib. Failed repeat DCCV on amiodarone. Amiodarone discontinued in March. I believe that most of his current symptoms are related to AFib.  He is  on Xarelto for [place] vasc score of 5. Rate control variable per patient report but generally OK at rest.  5. HTN. Controlled.  6. DM type 2 7. HLD- on lipitor. Excellent control 8. Abnormal myoview in the past without ischemia but reduced EF.   Following for now with no anginal symptoms. Will eventually need cardiac cath for evaluation depending on progression of AI/anuerysm.   As per discussion above I believe his symptoms are largely related to Afib and that he would benefit from restoration of NSR. Previously we considered that if he were to need surgery for his AV or thoracic aneurysm then a MAZE procedure could be done at the same time. He doesn't currently meet criteria for Aortic grafting but we will reassess AI by Echo. I have also recommended a right and left heart cath to make sure he does not have CAD contributing to his symptoms. If he were to need surgery for aneurysm, AI, CAD then MAZE procedure would be ideal. If, however, he does not need surgery in near future then this would argue for catheter Afib ablation. We discussed the possibility of Tikosyn but there is no guarantee this would work any better than amiodarone. [facility] planned for August.    Signed,  [name], MD ,[facility] 12/19/2018 2:47 PM    [facility] [address], [place], [place], [address] Phone [phone], Fax [phone]

## 2018-12-19 ENCOUNTER — Ambulatory Visit (INDEPENDENT_AMBULATORY_CARE_PROVIDER_SITE_OTHER): Payer: Medicare HMO | Admitting: Cardiology

## 2018-12-19 ENCOUNTER — Telehealth: Payer: Self-pay

## 2018-12-19 ENCOUNTER — Encounter: Payer: Self-pay | Admitting: Cardiology

## 2018-12-19 ENCOUNTER — Other Ambulatory Visit: Payer: Self-pay

## 2018-12-19 VITALS — BP 123/75 | HR 92 | Ht 77.0 in | Wt 294.0 lb

## 2018-12-19 DIAGNOSIS — I4819 Other persistent atrial fibrillation: Secondary | ICD-10-CM

## 2018-12-19 DIAGNOSIS — I712 Thoracic aortic aneurysm, without rupture, unspecified: Secondary | ICD-10-CM

## 2018-12-19 DIAGNOSIS — I10 Essential (primary) hypertension: Secondary | ICD-10-CM | POA: Diagnosis not present

## 2018-12-19 DIAGNOSIS — I5032 Chronic diastolic (congestive) heart failure: Secondary | ICD-10-CM | POA: Diagnosis not present

## 2018-12-19 DIAGNOSIS — I351 Nonrheumatic aortic (valve) insufficiency: Secondary | ICD-10-CM | POA: Diagnosis not present

## 2018-12-19 NOTE — Patient Instructions (Signed)
Medication Instructions:  Continue same medications If you need a refill on your cardiac medications before your next appointment, please call your pharmacy.   Lab work: None ordered   Testing/Procedures: Schedule Echo  Follow-Up: At [facility], you and your health needs are our priority.  As part of our continuing mission to provide you with exceptional heart care, we have created designated Provider Care Teams.  These Care Teams include your primary Cardiologist (physician) and Advanced Practice Providers (APPs -  Physician Assistants and Nurse Practitioners) who all work together to provide you with the care you need, when you need it. . Right and Left Cardiac Cath to be scheduled in August

## 2018-12-19 NOTE — Telephone Encounter (Signed)
Called patient no answer.[facility] me about cardiac cath scheduled.

## 2018-12-22 ENCOUNTER — Other Ambulatory Visit: Payer: Self-pay | Admitting: Cardiology

## 2018-12-22 DIAGNOSIS — I351 Nonrheumatic aortic (valve) insufficiency: Secondary | ICD-10-CM

## 2018-12-22 MED ORDER — SODIUM CHLORIDE 0.9% FLUSH: 3.0000 mL | Freq: Two times a day (BID) | INTRAVENOUS | Status: DC

## 2018-12-26 ENCOUNTER — Telehealth: Payer: Self-pay

## 2018-12-26 DIAGNOSIS — I5032 Chronic diastolic (congestive) heart failure: Secondary | ICD-10-CM

## 2018-12-26 DIAGNOSIS — I4819 Other persistent atrial fibrillation: Secondary | ICD-10-CM

## 2018-12-26 DIAGNOSIS — Z01812 Encounter for preprocedural laboratory examination: Secondary | ICD-10-CM

## 2018-12-26 NOTE — Telephone Encounter (Signed)
Called patient left message to call me back.I need to review cath instructions.

## 2018-12-26 NOTE — Telephone Encounter (Signed)
 @  [address] [place] [address] Dept: [phone] Loc: [place]  12/26/2018  You are scheduled for a Cardiac Cath on Monday 01/30/19  with Dr.Jordan.  1. Please arrive at the [facility] (Main Entrance A) at [facility]: [address] [place], [place] [address] at 5:30 am (This time is two hours before your procedure to ensure your preparation). Free valet parking service is available.   Special note: Every effort is made to have your procedure done on time. Please understand that emergencies sometimes delay scheduled procedures.  2. Diet: Do not eat solid foods after midnight.  The patient may have clear liquids until 5am upon the day of the procedure.  3. Labs: You will need to have blood drawn on Tuesday 01/24/19 at 10:15 am at [facility] [place] office. You do not need to be fasting.  Covid drive thru test Tuesday 8/11 at 11:00 am at [place].  4. Medication instructions in preparation for your procedure:       Hold Empagliflozin-Metformin day before cath and 2 days after cath.        Hold Xarelto 2 days before cath and hold the morning of.     Hold Spironolactone morning of cath    On the morning of your procedure, take your Aspirin 81 mg  and any morning medicines NOT listed above.  You may use sips of water.  5. Plan for one night stay--bring personal belongings. 6. Bring a current list of your medications and current insurance cards. 7. You MUST have a responsible person to drive you home. 8. Someone MUST be with you the first 24 hours after you arrive home or your discharge will be delayed. 9. Please wear clothes that are easy to get on and off and wear slip-on shoes.  Thank you for allowing [place] to care for you!   -- [facility]

## 2018-12-28 ENCOUNTER — Other Ambulatory Visit: Payer: Self-pay | Admitting: Cardiology

## 2018-12-28 ENCOUNTER — Ambulatory Visit (HOSPITAL_COMMUNITY): Payer: Medicare HMO | Attending: Cardiology

## 2018-12-28 ENCOUNTER — Other Ambulatory Visit: Payer: Self-pay

## 2018-12-28 DIAGNOSIS — I5032 Chronic diastolic (congestive) heart failure: Secondary | ICD-10-CM | POA: Diagnosis not present

## 2018-12-28 DIAGNOSIS — I10 Essential (primary) hypertension: Secondary | ICD-10-CM | POA: Diagnosis not present

## 2018-12-28 DIAGNOSIS — I4819 Other persistent atrial fibrillation: Secondary | ICD-10-CM | POA: Insufficient documentation

## 2018-12-28 DIAGNOSIS — I712 Thoracic aortic aneurysm, without rupture, unspecified: Secondary | ICD-10-CM

## 2018-12-28 DIAGNOSIS — I351 Nonrheumatic aortic (valve) insufficiency: Secondary | ICD-10-CM | POA: Insufficient documentation

## 2019-01-24 ENCOUNTER — Other Ambulatory Visit: Payer: Medicare HMO

## 2019-01-25 ENCOUNTER — Other Ambulatory Visit: Payer: Self-pay

## 2019-01-25 ENCOUNTER — Telehealth: Payer: Self-pay | Admitting: Cardiology

## 2019-01-25 ENCOUNTER — Other Ambulatory Visit: Payer: Medicare HMO

## 2019-01-25 DIAGNOSIS — I5032 Chronic diastolic (congestive) heart failure: Secondary | ICD-10-CM | POA: Diagnosis not present

## 2019-01-25 DIAGNOSIS — Z20822 Contact with and (suspected) exposure to covid-19: Secondary | ICD-10-CM

## 2019-01-25 DIAGNOSIS — I4819 Other persistent atrial fibrillation: Secondary | ICD-10-CM | POA: Diagnosis not present

## 2019-01-25 DIAGNOSIS — Z01812 Encounter for preprocedural laboratory examination: Secondary | ICD-10-CM | POA: Diagnosis not present

## 2019-01-25 NOTE — Telephone Encounter (Signed)
Patient missed his COVID screening today at 11am, he wants to know if he is still able to go today or if he can be rescheduled for tomorrow.

## 2019-01-25 NOTE — Telephone Encounter (Signed)
Patient in office for lab work and has questions about his cath. Provided print out of cath instructions, questions answered

## 2019-01-25 NOTE — Telephone Encounter (Signed)
S/W PT AND INFORMED HIM TO COME TODAY FOR LAB TEST AND COVID TESTING. AND INFORMED TO SELF QUARANTINE AFTER TEST UNTIL LEAVING TO COME IN FOR CATH. VERBALIZED UNDERSTANDING. HE WILL WEAR A MASK.

## 2019-01-26 ENCOUNTER — Telehealth: Payer: Self-pay | Admitting: *Deleted

## 2019-01-26 LAB — CBC WITH DIFFERENTIAL/PLATELET
Basophils Absolute: 0.1 10*3/uL (ref 0.0–0.2)
Basos: 1 %
EOS (ABSOLUTE): 0.4 10*3/uL (ref 0.0–0.4)
Eos: 5 %
Hematocrit: 48 % (ref 37.5–51.0)
Hemoglobin: 16.4 g/dL (ref 13.0–17.7)
Immature Grans (Abs): 0 10*3/uL (ref 0.0–0.1)
Immature Granulocytes: 0 %
Lymphocytes Absolute: 2.4 10*3/uL (ref 0.7–3.1)
Lymphs: 28 %
MCH: 30.1 pg (ref 26.6–33.0)
MCHC: 34.2 g/dL (ref 31.5–35.7)
MCV: 88 fL (ref 79–97)
Monocytes Absolute: 0.6 10*3/uL (ref 0.1–0.9)
Monocytes: 7 %
Neutrophils Absolute: 5.1 10*3/uL (ref 1.4–7.0)
Neutrophils: 59 %
Platelets: 210 10*3/uL (ref 150–450)
RBC: 5.45 x10E6/uL (ref 4.14–5.80)
RDW: 12.4 % (ref 11.6–15.4)
WBC: 8.6 10*3/uL (ref 3.4–10.8)

## 2019-01-26 LAB — BASIC METABOLIC PANEL
BUN/Creatinine Ratio: 21 (ref 10–24)
BUN: 22 mg/dL (ref 8–27)
CO2: 21 mmol/L (ref 20–29)
Calcium: 9.8 mg/dL (ref 8.6–10.2)
Chloride: 104 mmol/L (ref 96–106)
Creatinine, Ser: 1.07 mg/dL (ref 0.76–1.27)
GFR calc Af Amer: 80 mL/min/{1.73_m2} (ref >59)
GFR calc non Af Amer: 69 mL/min/{1.73_m2} (ref >59)
Glucose: 133 mg/dL — ABNORMAL HIGH (ref 65–99)
Potassium: 4.6 mmol/L (ref 3.5–5.2)
Sodium: 140 mmol/L (ref 134–144)

## 2019-01-26 NOTE — Telephone Encounter (Signed)
Pt contacted pre-catheterization scheduled at [facility] for: Monday January 30, 2019 7:30 AM Verified arrival time and place: [facility]) at: 5:30 AM  No solid food after midnight prior to cath, clear liquids until 5 AM day of procedure. Contrast allergy: no  Hold: Xarelto-none 01/28/19 until post procedure. Empagliflozin-metformin-day of procedure and 48 hours post procedure. Spironolactone-AM of procedure  Except hold medications AM meds can be  taken pre-cath with sip of water including: ASA 81 mg   Confirmed patient has responsible person to drive home post procedure and observe 24 hours after arriving home: yes   Due to Covid-19 pandemic, only one support person will be allowed with patient. Must be the same support person for that patient's entire stay, will be screened and required to wear a mask.   Patients are required to wear a mask when they enter the hospital.      COVID-19 Pre-Screening Questions:  . In the past 7 to 10 days have you had a cough,  shortness of breath, headache, congestion, fever (100 or greater) body aches, chills, sore throat, or sudden loss of taste or sense of smell? Shortness of breath-not new . Have you been around anyone with known Covid 19? no . Have you been around anyone who is awaiting Covid 19 test results in the past 7 to 10 days? no . Have you been around anyone who has been exposed to Covid 19, or has mentioned symptoms of Covid 19 within the past 7 to 10 days? no  I reviewed procedure/mask/visitor, Covid-19 screening questions with patient, he verbalized understanding, thanked me for call.

## 2019-01-27 ENCOUNTER — Telehealth: Payer: Self-pay

## 2019-01-27 LAB — NOVEL CORONAVIRUS, NAA: SARS-CoV-2, NAA: NOT DETECTED

## 2019-01-27 NOTE — Telephone Encounter (Signed)
Spoke to patient cath scheduled with Dr.Jordan 01/30/19 instructions reviewed.Patient voiced understanding.Amiodarone level was not done with pre cath labs due to lab tech not having correct tube.Advised I will make Dr.Jordan aware.

## 2019-01-30 ENCOUNTER — Encounter (HOSPITAL_COMMUNITY)
Admission: RE | Disposition: A | Discharge status: Home / Self Care | Payer: Medicare HMO | Source: Ambulatory Visit | Attending: Cardiology

## 2019-01-30 ENCOUNTER — Other Ambulatory Visit: Payer: Self-pay

## 2019-01-30 ENCOUNTER — Ambulatory Visit (HOSPITAL_COMMUNITY)
Admission: RE | Admit: 2019-01-30 | Discharge: 2019-01-30 | Disposition: A | Discharge status: Home / Self Care | Payer: Medicare HMO | Source: Ambulatory Visit | Attending: Cardiology | Admitting: Cardiology

## 2019-01-30 ENCOUNTER — Encounter (HOSPITAL_COMMUNITY): Payer: Self-pay | Admitting: Cardiology

## 2019-01-30 DIAGNOSIS — N400 Enlarged prostate without lower urinary tract symptoms: Secondary | ICD-10-CM | POA: Diagnosis not present

## 2019-01-30 DIAGNOSIS — E785 Hyperlipidemia, unspecified: Secondary | ICD-10-CM | POA: Diagnosis present

## 2019-01-30 DIAGNOSIS — E119 Type 2 diabetes mellitus without complications: Secondary | ICD-10-CM | POA: Diagnosis not present

## 2019-01-30 DIAGNOSIS — G510 Bell's palsy: Secondary | ICD-10-CM | POA: Insufficient documentation

## 2019-01-30 DIAGNOSIS — Z8249 Family history of ischemic heart disease and other diseases of the circulatory system: Secondary | ICD-10-CM | POA: Diagnosis not present

## 2019-01-30 DIAGNOSIS — G4700 Insomnia, unspecified: Secondary | ICD-10-CM | POA: Diagnosis not present

## 2019-01-30 DIAGNOSIS — E118 Type 2 diabetes mellitus with unspecified complications: Secondary | ICD-10-CM

## 2019-01-30 DIAGNOSIS — I4819 Other persistent atrial fibrillation: Secondary | ICD-10-CM | POA: Diagnosis not present

## 2019-01-30 DIAGNOSIS — I712 Thoracic aortic aneurysm, without rupture, unspecified: Secondary | ICD-10-CM | POA: Diagnosis present

## 2019-01-30 DIAGNOSIS — I10 Essential (primary) hypertension: Secondary | ICD-10-CM | POA: Diagnosis present

## 2019-01-30 DIAGNOSIS — I2510 Atherosclerotic heart disease of native coronary artery without angina pectoris: Secondary | ICD-10-CM | POA: Insufficient documentation

## 2019-01-30 DIAGNOSIS — M1990 Unspecified osteoarthritis, unspecified site: Secondary | ICD-10-CM | POA: Diagnosis not present

## 2019-01-30 DIAGNOSIS — I351 Nonrheumatic aortic (valve) insufficiency: Secondary | ICD-10-CM | POA: Diagnosis not present

## 2019-01-30 DIAGNOSIS — R918 Other nonspecific abnormal finding of lung field: Secondary | ICD-10-CM | POA: Insufficient documentation

## 2019-01-30 DIAGNOSIS — I4891 Unspecified atrial fibrillation: Secondary | ICD-10-CM | POA: Diagnosis not present

## 2019-01-30 DIAGNOSIS — Z79899 Other long term (current) drug therapy: Secondary | ICD-10-CM | POA: Insufficient documentation

## 2019-01-30 DIAGNOSIS — I110 Hypertensive heart disease with heart failure: Secondary | ICD-10-CM | POA: Diagnosis not present

## 2019-01-30 DIAGNOSIS — Z6834 Body mass index (BMI) 34.0-34.9, adult: Secondary | ICD-10-CM | POA: Insufficient documentation

## 2019-01-30 DIAGNOSIS — Z7984 Long term (current) use of oral hypoglycemic drugs: Secondary | ICD-10-CM | POA: Diagnosis not present

## 2019-01-30 DIAGNOSIS — R0609 Other forms of dyspnea: Secondary | ICD-10-CM | POA: Diagnosis not present

## 2019-01-30 DIAGNOSIS — Z87891 Personal history of nicotine dependence: Secondary | ICD-10-CM | POA: Insufficient documentation

## 2019-01-30 DIAGNOSIS — E782 Mixed hyperlipidemia: Secondary | ICD-10-CM | POA: Insufficient documentation

## 2019-01-30 DIAGNOSIS — Z7901 Long term (current) use of anticoagulants: Secondary | ICD-10-CM | POA: Diagnosis not present

## 2019-01-30 DIAGNOSIS — I5032 Chronic diastolic (congestive) heart failure: Secondary | ICD-10-CM | POA: Diagnosis not present

## 2019-01-30 DIAGNOSIS — R0600 Dyspnea, unspecified: Secondary | ICD-10-CM | POA: Diagnosis present

## 2019-01-30 DIAGNOSIS — I5042 Chronic combined systolic (congestive) and diastolic (congestive) heart failure: Secondary | ICD-10-CM | POA: Diagnosis not present

## 2019-01-30 HISTORY — PX: RIGHT/LEFT HEART CATH AND CORONARY ANGIOGRAPHY: CATH118266

## 2019-01-30 LAB — POCT I-STAT EG7
Acid-base deficit: 2 mmol/L (ref 0.0–2.0)
Bicarbonate: 25.2 mmol/L (ref 20.0–28.0)
Calcium, Ion: 1.25 mmol/L (ref 1.15–1.40)
HCT: 42 % (ref 39.0–52.0)
Hemoglobin: 14.3 g/dL (ref 13.0–17.0)
O2 Saturation: 68 %
Potassium: 4.2 mmol/L (ref 3.5–5.1)
Sodium: 142 mmol/L (ref 135–145)
TCO2: 27 mmol/L (ref 22–32)
pCO2, Ven: 49.8 mmHg (ref 44.0–60.0)
pH, Ven: 7.312 (ref 7.250–7.430)
pO2, Ven: 39 mmHg (ref 32.0–45.0)

## 2019-01-30 LAB — POCT I-STAT 7, (LYTES, BLD GAS, ICA,H+H)
Acid-base deficit: 2 mmol/L (ref 0.0–2.0)
Bicarbonate: 24.4 mmol/L (ref 20.0–28.0)
Calcium, Ion: 1.28 mmol/L (ref 1.15–1.40)
HCT: 42 % (ref 39.0–52.0)
Hemoglobin: 14.3 g/dL (ref 13.0–17.0)
O2 Saturation: 97 %
Potassium: 4.3 mmol/L (ref 3.5–5.1)
Sodium: 141 mmol/L (ref 135–145)
TCO2: 26 mmol/L (ref 22–32)
pCO2 arterial: 46 mmHg (ref 32.0–48.0)
pH, Arterial: 7.333 — ABNORMAL LOW (ref 7.350–7.450)
pO2, Arterial: 103 mmHg (ref 83.0–108.0)

## 2019-01-30 LAB — GLUCOSE, CAPILLARY: Glucose-Capillary: 154 mg/dL — ABNORMAL HIGH (ref 70–99)

## 2019-01-30 SURGERY — RIGHT/LEFT HEART CATH AND CORONARY ANGIOGRAPHY
Anesthesia: LOCAL

## 2019-01-30 MED ORDER — VERAPAMIL HCL 2.5 MG/ML IV SOLN
INTRAVENOUS | Status: DC | PRN
  Administered 2019-01-30: 10 mL via INTRA_ARTERIAL

## 2019-01-30 MED ORDER — ASPIRIN 81 MG PO CHEW
81.0000 mg | CHEWABLE_TABLET | ORAL | Status: AC
  Administered 2019-01-30: 81 mg via ORAL
  Filled 2019-01-30: qty 1

## 2019-01-30 MED ORDER — EMPAGLIFLOZIN-METFORMIN HCL 5-500 MG PO TABS: 1.0000 | ORAL_TABLET | Freq: Every day | ORAL | 0 refills | Status: DC

## 2019-01-30 MED ORDER — FENTANYL CITRATE (PF) 100 MCG/2ML IJ SOLN
INTRAMUSCULAR | Status: DC | PRN
  Administered 2019-01-30: 25 ug via INTRAVENOUS

## 2019-01-30 MED ORDER — SODIUM CHLORIDE 0.9% FLUSH: 3.0000 mL | INTRAVENOUS | Status: DC | PRN

## 2019-01-30 MED ORDER — HEPARIN (PORCINE) IN NACL 1000-0.9 UT/500ML-% IV SOLN
INTRAVENOUS | Status: DC | PRN
  Administered 2019-01-30 (×2): 500 mL

## 2019-01-30 MED ORDER — FENTANYL CITRATE (PF) 100 MCG/2ML IJ SOLN
INTRAMUSCULAR | Status: AC
  Filled 2019-01-30: qty 2

## 2019-01-30 MED ORDER — ONDANSETRON HCL 4 MG/2ML IJ SOLN
4.0000 mg | Freq: Four times a day (QID) | INTRAMUSCULAR | Status: DC | PRN
  Administered 2019-01-30: 4 mg via INTRAVENOUS
  Filled 2019-01-30: qty 2

## 2019-01-30 MED ORDER — IOHEXOL 350 MG/ML SOLN
INTRAVENOUS | Status: DC | PRN
  Administered 2019-01-30: 90 mL via INTRA_ARTERIAL

## 2019-01-30 MED ORDER — SODIUM CHLORIDE 0.9 % WEIGHT BASED INFUSION
3.0000 mL/kg/h | INTRAVENOUS | Status: AC
  Administered 2019-01-30: 3 mL/kg/h via INTRAVENOUS

## 2019-01-30 MED ORDER — MIDAZOLAM HCL 2 MG/2ML IJ SOLN
INTRAMUSCULAR | Status: DC | PRN
  Administered 2019-01-30: 1 mg via INTRAVENOUS

## 2019-01-30 MED ORDER — SODIUM CHLORIDE 0.9 % WEIGHT BASED INFUSION: 1.0000 mL/kg/h | INTRAVENOUS | Status: DC

## 2019-01-30 MED ORDER — VERAPAMIL HCL 2.5 MG/ML IV SOLN
INTRAVENOUS | Status: AC
  Filled 2019-01-30: qty 2

## 2019-01-30 MED ORDER — HEPARIN SODIUM (PORCINE) 1000 UNIT/ML IJ SOLN
INTRAMUSCULAR | Status: DC | PRN
  Administered 2019-01-30: 6000 [IU] via INTRAVENOUS

## 2019-01-30 MED ORDER — RIVAROXABAN 20 MG PO TABS: 20.0000 mg | ORAL_TABLET | Freq: Every day | ORAL | Status: DC

## 2019-01-30 MED ORDER — MIDAZOLAM HCL 2 MG/2ML IJ SOLN
INTRAMUSCULAR | Status: AC
  Filled 2019-01-30: qty 2

## 2019-01-30 MED ORDER — HEPARIN (PORCINE) IN NACL 1000-0.9 UT/500ML-% IV SOLN
INTRAVENOUS | Status: AC
  Filled 2019-01-30: qty 1000

## 2019-01-30 MED ORDER — SODIUM CHLORIDE 0.9 % IV SOLN: 250.0000 mL | INTRAVENOUS | Status: DC | PRN

## 2019-01-30 MED ORDER — LIDOCAINE HCL (PF) 1 % IJ SOLN
INTRAMUSCULAR | Status: AC
  Filled 2019-01-30: qty 30

## 2019-01-30 MED ORDER — ACETAMINOPHEN 325 MG PO TABS
650.0000 mg | ORAL_TABLET | ORAL | Status: DC | PRN
  Administered 2019-01-30: 650 mg via ORAL
  Filled 2019-01-30: qty 2

## 2019-01-30 MED ORDER — LIDOCAINE HCL (PF) 1 % IJ SOLN
INTRAMUSCULAR | Status: DC | PRN
  Administered 2019-01-30: 5 mL

## 2019-01-30 MED ORDER — HEPARIN SODIUM (PORCINE) 1000 UNIT/ML IJ SOLN
INTRAMUSCULAR | Status: AC
  Filled 2019-01-30: qty 1

## 2019-01-30 MED ORDER — SODIUM CHLORIDE 0.9% FLUSH: 3.0000 mL | Freq: Two times a day (BID) | INTRAVENOUS | Status: DC

## 2019-01-30 SURGICAL SUPPLY — 14 items
CATH 5FR JL3.5 JR4 ANG PIG MP (CATHETERS) ×2 IMPLANT
CATH BALLN WEDGE 5F 110CM (CATHETERS) ×1 IMPLANT
DEVICE RAD COMP TR BAND LRG (VASCULAR PRODUCTS) ×1 IMPLANT
GLIDESHEATH SLEND SS 6F .021 (SHEATH) ×1 IMPLANT
GUIDEWIRE .025 260CM (WIRE) ×1 IMPLANT
GUIDEWIRE INQWIRE 1.5J.035X260 (WIRE) IMPLANT
INQWIRE 1.5J .035X260CM (WIRE) ×2
KIT HEART LEFT (KITS) ×2 IMPLANT
PACK CARDIAC CATHETERIZATION (CUSTOM PROCEDURE TRAY) ×2 IMPLANT
SHEATH GLIDE SLENDER 4/5FR (SHEATH) ×1 IMPLANT
SYR MEDRAD MARK 7 150ML (SYRINGE) ×2 IMPLANT
TRANSDUCER W/STOPCOCK (MISCELLANEOUS) ×2 IMPLANT
TUBING CIL FLEX 10 FLL-RA (TUBING) ×2 IMPLANT
WIRE EMERALD 3MM-J .025X260CM (WIRE) ×1 IMPLANT

## 2019-01-30 NOTE — Interval H&P Note (Signed)
History and Physical Interval Note:  01/30/2019 7:25 AM  Clinton Black  has presented today for surgery, with the diagnosis of shortness of breath.  The various methods of treatment have been discussed with the patient and family. After consideration of risks, benefits and other options for treatment, the patient has consented to  Procedure(s): RIGHT/LEFT HEART CATH AND CORONARY ANGIOGRAPHY (N/A) as a surgical intervention.  The patient's history has been reviewed, patient examined, no change in status, stable for surgery.  I have reviewed the patient's chart and labs.  Questions were answered to the patient's satisfaction.   Cath Lab Visit (complete for each Cath Lab visit)  Clinical Evaluation Leading to the Procedure:   ACS: No.  Non-ACS:    Anginal Classification: CCS III  Anti-ischemic medical therapy: Minimal Therapy (1 class of medications)  Non-Invasive Test Results: Intermediate-risk stress test findings: cardiac mortality 1-3%/year  Prior CABG: No previous CABG        Clinton Black [facility] 01/30/2019 7:25 AM

## 2019-01-30 NOTE — H&P (Signed)
History and Physical  ID:  Clinton, Black 05/17/1946, MRN [id number]  PCP:  [name], MD   Cardiologist:   [name], MD      Chief Complaint  Patient presents with  . Shortness of Breath  . Atrial Fibrillation      History of Present Illness: Clinton Black is a 73 y.o. male who is seen for follow up atrial fibrillation. He has a history of thoracic aortic aneurysm.  He has a history of DM type 2, morbid obesity, HLD, and HTN.   In 2018 he was experiencing dyspnea, often awakening him at night. He states that since he stopped smoking marijuana this has largely resolved.  He was seen by Dr. Ronnald Ramp and noted to have frequent ectopy. Ecg showed PVC couplets. He was hypertensive and started on Edarbi.  Denied any chest pain or syncope. No edema. Quit smoking in 1996. Has lost 25-30 lbs this past year. He does have a family history of CAD.   On  his initial visit he underwent evaluation with an Echocardiogram that showed "normal" LV function with moderate AI and dilated aortic root at 44 mm. On my review I felt his EF was mildly impaired at 45%. Myoview study showed a fixed inferobasal defect, no ischemia. EF 39%. He was started on Toprol XL and a CT of the aorta was ordered. 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   He was seen in December 2018 with increased PND. Started on aldactone with clinical improvement. Also on metoprolol and losartan. Repeat Echo done and was stable. Reported low normal EF and only "mild" AI.   Echo in October 2019 showed EF 50-55%. Diastolic dysfunction. Aortic dimension 5 cm. Moderate AI.   In November 2019  he had cystoscopy with implantation of Urolift devices. This was without complication and no arrhythmia reported.  Seen by Dr. Servando Snare on Dec 6 and HR recorded at 5. When seen by Dr Ronnald Ramp found to be in Afib with rate 91. He was started on Xarelto. On our evaluation he was in  persistent AFib. Rate controlled on metoprolol.   On 07/12/18 he underwent successful DCCV. One week later he felt like he was out of rhythm. He did note that when he was in NSR he felt much better with decreased SOB and improved exercise duration and energy. He was seen in the Afib clinic and loaded with amiodarone. Repeat attempt at [place] on March 9 was unsuccessful. When seen back in the Afib clinic amiodarone was discontinued. Options discussed included Tikosyn once amiodarone had washed out or MAZE procedure if surgery needed. Maintained on rate control and anticoagulation. He was seen in the ED on March 20 with facial droop due to Bell's palsy. Cranial MRI negative for CVA.   He was seen by Dr Servando Snare on July 2. Noted continued functional decline and dyspnea. Noted HR varied between 50-180 bpm by patient report. CT showed stable thoracic aneurysm 4.8 cm. Echo repeated and showed normal LV size and function. Ef 60-65%. Moderate AI. Aortic root dimension 49 mm.   On follow up today he complains that Afib is "wearing me out". SOB with any exertion. Feels fatigued and weak. HR controlled at rest but goes up to 150s with exertion by Apple watch. He notes that after his last DCCV he developed a Bell's palsy that week. Those symptoms have resolved. He stopped amiodarone on 08/29/18.  Past Medical History:  Diagnosis Date  . Arthritis   . BPH (benign prostatic hyperplasia)   . Diabetes mellitus type 2 in obese (HCC)    diet controlled  . HTN (hypertension)   . Hyperlipidemia, mixed   . Insomnia   . Obesity   . Thoracic aortic aneurysm, without rupture (HCC)    4.7 cm per chest ct with contrast 11-04-17         Past Surgical History:  Procedure Laterality Date  . CARDIOVERSION N/A 07/12/2018   Procedure: CARDIOVERSION;  Surgeon: Acie Fredrickson Wonda Cheng, MD;  Location: [facility] ENDOSCOPY;  Service: Cardiovascular;  Laterality: N/A;  . CARDIOVERSION N/A 08/22/2018   Procedure:  CARDIOVERSION;  Surgeon: Skeet Latch, MD;  Location: [place];  Service: Cardiovascular;  Laterality: N/A;  . COLONOSCOPY  2016  . CYST EXCISION     polynomial  . CYSTOSCOPY WITH INSERTION OF UROLIFT N/A 04/18/2018   Procedure: CYSTOSCOPY WITH INSERTION OF UROLIFT;  Surgeon: Cleon Gustin, MD;  [facility];  Service: Urology;  Laterality: N/A;  . KNEE SURGERY Right 2000   arthroscopy  . TONSILLECTOMY    . widson teeth extraction             Current Outpatient Medications  Medication Sig Dispense Refill  . atorvastatin (LIPITOR) 20 MG tablet Take 1 tablet (20 mg total) by mouth daily. (Patient taking differently: Take 20 mg by mouth every evening. ) 90 tablet 1  . Empagliflozin-metFORMIN HCl 5-500 MG TABS Take 1 tablet by mouth 2 (two) times a day. 180 tablet 0  . metoprolol succinate (TOPROL-XL) 25 MG 24 hr tablet TAKE 1 TABLET BY MOUTH EVERY DAY 90 tablet 2  . spironolactone (ALDACTONE) 25 MG tablet Take 0.5 tablets (12.5 mg total) by mouth at bedtime. 45 tablet 3  . triazolam (HALCION) 0.25 MG tablet TAKE 1 TABLET (0.25 MG TOTAL) BY MOUTH AT BEDTIME AS NEEDED. 30 tablet 3  . TURMERIC PO Take 10 mLs by mouth every evening.     Alveda Reasons 20 MG TABS tablet TAKE 1 TABLET (20 MG TOTAL) BY MOUTH DAILY WITH SUPPER. 90 tablet 1   No current facility-administered medications for this visit.     Allergies:   Quinolones    Social History:  The patient  reports that he quit smoking about 24 years ago. His smoking use included cigarettes. He has a 7.50 pack-year smoking history. He has never used smokeless tobacco. He reports current alcohol use of about 14.0 standard drinks of alcohol per week. He reports current drug use. Drug: Marijuana.   Family History:  The patient's family history includes Aneurysm in his father; Arthritis in his mother; Heart disease in his father; Macular degeneration in his mother; Varicose Veins in his mother.     ROS:  Please see the history of present illness.   Otherwise, review of systems are positive for none.   All other systems are reviewed and negative.    PHYSICAL EXAM: VS:  BP 123/75   Pulse 92   Ht 6\' 5"  (1.956 m)   Wt 294 lb (133.4 kg)   BMI 34.86 kg/m  , BMI Body mass index is 34.86 kg/m. GENERAL:  Well appearing obese WM in NAD HEENT:  PERRL, EOMI, sclera are clear. Oropharynx is clear. NECK:  No jugular venous distention, carotid upstroke brisk and symmetric, no bruits, no thyromegaly or adenopathy LUNGS:  Clear to auscultation bilaterally CHEST:  Unremarkable HEART:  IRRR,  PMI not displaced or  sustained,S1 and S2 within normal limits, no S3, no S4: no clicks, no rubs, no audible murmurs ABD:  Soft, nontender. BS +, no masses or bruits. No hepatomegaly, no splenomegaly EXT:  2 + pulses throughout, no edema, no cyanosis no clubbing SKIN:  Warm and dry.  No rashes NEURO:  Alert and oriented x 3. Cranial nerves II through XII intact. PSYCH:  Cognitively intact  EKG:  EKG is ordered today.Afib rate 92.  T wave inversion  inferiorly. QTc 445 msec.I have personally reviewed and interpreted this study.   Recent Labs: 06/01/2018: ALT 21 08/18/2018: Hemoglobin 15.5; Platelets 214 10/20/2018: BUN 19; Creatinine, Ser 1.01; Potassium 4.6; Sodium 141; TSH 5.35    Lipid Panel Labs (Brief)          Component Value Date/Time   CHOL 110 06/01/2018 1328   CHOL 105 03/11/2017 1541   TRIG 137.0 06/01/2018 1328   HDL 34.30 (L) 06/01/2018 1328   HDL 36 (L) 03/11/2017 1541   CHOLHDL 3 06/01/2018 1328   VLDL 27.4 06/01/2018 1328   LDLCALC 48 06/01/2018 1328   LDLCALC 46 03/11/2017 1541   LDLDIRECT 120.0 05/11/2016 1433      dated 07/15/17: A1c 6.8%.     Wt Readings from Last 3 Encounters:  12/19/18 294 lb (133.4 kg)  12/15/18 295 lb (133.8 kg)  10/20/18 291 lb 4 oz (132.1 kg)      Other studies Reviewed: Additional studies/ records that were reviewed  today include:    Myoview 02/12/17: Study Highlights    The left ventricular ejection fraction is moderately decreased (30-44%).  Nuclear stress EF: 39%.  There was no ST segment deviation noted during stress.  No T wave inversion was noted during stress.  This is an intermediate risk study due to reduced systolic function.  There is no ischemia.    Echo 03/29/18: Study Conclusions  - Left ventricle: The cavity size was normal. There was moderate basal hypertrophy of the septum with otherwise mild concentric hypertrophy. Systolic function was normal. The estimated ejection fraction was in the range of 50% to 55%. Wall motion was normal; there were no regional wall motion abnormalities. Doppler parameters are consistent with abnormal left ventricular relaxation (grade 1 diastolic dysfunction). - Aortic valve: Transvalvular velocity was within the normal range. There was no stenosis. There was moderate regurgitation. - Aorta: Ascending aortic diameter: 50 mm (S). 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 - Mitral valve: Transvalvular velocity was within the normal range. There was no evidence for stenosis. There was trivial regurgitation. - Left atrium: The atrium was moderately dilated. - Right ventricle: The cavity size was normal. Wall thickness was normal. Systolic function was normal. - Pulmonary arteries: Systolic pressure was within the normal range. PA peak pressure: 24 mm Hg (S).  Impressions:  - Compared with the echo 05/2017, the ascending aorta aneurysm has increased from 4.7 cm to 5.0 cm.  CT ANGIOGRAPHY CHEST WITH CONTRAST  TECHNIQUE: Multidetector CT imaging of the chest was performed using the standard protocol during bolus administration of intravenous contrast. Multiplanar CT image reconstructions and MIPs were obtained to evaluate the vascular anatomy.  CONTRAST: 103mL ISOVUE-370 IOPAMIDOL  (ISOVUE-370) INJECTION 76%  COMPARISON: 11/04/2017, 02/26/2017  FINDINGS: Cardiovascular: Preferential opacification of the thoracic aorta. Unchanged enlargement of the tubular thoracic aorta, measuring 5.0 x 4.8 cm. The aortic root and sinuses of Valsalva are normal in caliber. Unchanged enlargement of the distal arch and descending thoracic aorta measuring 4.1 x 4.1 cm in  the midportion of the descending aorta. Moderate mixed calcific atherosclerosis. Normal heart size. Left coronary artery calcifications. No pericardial effusion.  Mediastinum/Nodes: No enlarged mediastinal, hilar, or axillary lymph nodes. Thyroid gland, trachea, and esophagus demonstrate no significant findings.  Lungs/Pleura: Scattered nonspecific ground-glass opacity of the superior segment right lower lobe. Stable, small benign pulmonary nodules, for example a 3 mm fissural nodule of the superior segment left lower lobe (series 7, image 72). No pleural effusion or pneumothorax.  Upper Abdomen: No acute abnormality.  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 No acute or significant osseous findings.  Review of the MIP images confirms the above findings.  IMPRESSION: 1. Unchanged enlargement of the tubular thoracic aorta, measuring 5.0 x 4.8 cm. The aortic root and sinuses of Valsalva are normal in caliber. Unchanged enlargement of the distal arch and descending thoracic aorta measuring 4.1 x 4.1 cm in the midportion of the descending aorta. These findings are not significantly changed when compared to examinations dating back to 02/26/2017 and measured similarly. Moderate mixed calcific atherosclerosis.  2. Coronary artery disease.  3. Scattered nonspecific ground-glass opacity of the superior segment right lower lobe, likely infectious or inflammatory.  4. Stable, small benign pulmonary nodules, for example a 3 mm fissural nodule of the superior segment left  lower lobe (series 7, image 72).   Electronically Signed By: Eddie Candle M.D. On: 12/15/2018 11:04  Echo 12/28/18: IMPRESSIONS    1. The left ventricle has normal systolic function with an ejection fraction of 60-65%. The cavity size was normal. There is mildly increased left ventricular wall thickness. Left ventricular diastolic Doppler parameters are consistent with impaired  relaxation.  2. The right ventricle has normal systolic function. The cavity was normal. There is no increase in right ventricular wall thickness.  3. The aortic valve is tricuspid. Mild thickening of the aortic valve. Moderate calcification of the aortic valve. Aortic valve regurgitation is moderate by color flow Doppler.  4. There is dilatation of the ascending aorta measuring 49 mm.   ASSESSMENT AND PLAN: 1. Thoracic aortic aneurysm. 4.7-4.8 cm by most recent evaluation. Followed by Dr. Servando Snare with CT surgery. He has moderate AI. Plan to repeat CT in 6 months. Continue aggressive BP control. Continue Toprol XL 25 mg daily and losartan 50 mg daily.   Recommend avoiding heavy lifting or staining.  2. 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 No change 3. Chronic combined systolic/ diastolic CHF. Exacerbated by Afib. Continue metoprolol, losartan and aldactone.  4. Afib new onset in December and persistent. S/p DCCV with early return of Afib. Failed repeat DCCV on amiodarone. Amiodarone discontinued in March. I believe that most of his current symptoms are related to AFib.  He is  on Xarelto for [place] vasc score of 5. Rate control variable per patient report but generally OK at rest.  5. HTN. Controlled.  6. DM type 2 7. HLD- on lipitor. Excellent control 8. Abnormal myoview in the past without ischemia but reduced EF.   Following for now with symptoms of dyspnea on exertion  As per discussion above I believe his symptoms are largely related to Afib and that he would benefit from restoration of NSR. Previously we  considered that if he were to need surgery for his AV or thoracic aneurysm then a MAZE procedure could be done at the same time. He doesn't currently meet criteria for Aortic grafting and aortic dimension has not changed.  I have  recommended a right and left heart cath to  make sure he does not have CAD contributing to his symptoms. If  he does not need surgery in near future then this would argue for catheter Afib ablation. We discussed the possibility of Tikosyn but there is no guarantee this would work any better than amiodarone.    Signed, [name], MD ,[facility] 01/30/2019 7:24 AM    [place]

## 2019-01-30 NOTE — Discharge Instructions (Signed)
Keep affected arm elevated for the remainder of the day.   DRINK PLENTY OF FLUIDS FOR THE NEXT 2-3 DAYS TO KEEP HYDRATED.   HOLD METFORMIN FOR A FULL 48 HOURS AFTER DISCHARGE.   Radial Site Care  This sheet gives you information about how to care for yourself after your procedure. Your health care provider may also give you more specific instructions. If you have problems or questions, contact your health care provider. What can I expect after the procedure? After the procedure, it is common to have:  Bruising and tenderness at the catheter insertion area. Follow these instructions at home: Medicines  Take over-the-counter and prescription medicines only as told by your health care provider. Insertion site care  Follow instructions from your health care provider about how to take care of your insertion site. Make sure you: ? Wash your hands with soap and water before you change your bandage (dressing). If soap and water are not available, use hand sanitizer. ? Change your dressing as told by your health care provider.  Check your insertion site every day for signs of infection. Check for: ? Redness, swelling, or pain. ? Fluid or blood. ? Pus or a bad smell. ? Warmth.  Do not take baths, swim, or use a hot tub until your health care provider approves.  You may shower 24-48 hours after the procedure. ? Remove the dressing and gently wash the site with plain soap and water. ? Pat the area dry with a clean towel. ? Do not rub the site. That could cause bleeding.  Do not apply powder or lotion to the site. Activity   For 24 hours after the procedure, or as directed by your health care provider: ? Do not flex or bend the affected arm. ? Do not push or pull heavy objects with the affected arm. ? Do not drive yourself home from the hospital or clinic. You may drive 24 hours after the procedure unless your health care provider tells you not to. ? Do not operate machinery or  power tools.  Do not lift anything that is heavier than 10 lb (4.5 kg) for 5 days.  Ask your health care provider when it is okay to: ? Return to work or school. ? Resume usual physical activities or sports. ? Resume sexual activity. General instructions  If the catheter site starts to bleed, raise your arm and put firm pressure on the site. If the bleeding does not stop, get help right away. This is a medical emergency.  If you went home on the same day as your procedure, a responsible adult should be with you for the first 24 hours after you arrive home.  Keep all follow-up visits as told by your health care provider. This is important. Contact a health care provider if:  You have a fever.  You have redness, swelling, or yellow drainage around your insertion site. Get help right away if:  You have unusual pain at the radial site.  The catheter insertion area swells very fast.  The insertion area is bleeding, and the bleeding does not stop when you hold steady pressure on the area.  Your arm or hand becomes pale, cool, tingly, or numb. These symptoms may represent a serious problem that is an emergency. Do not wait to see if the symptoms will go away. Get medical help right away. Call your local emergency services (911 in the [place] Do not drive yourself to the hospital. Summary  After the procedure, it  is common to have bruising and tenderness at the site.  Follow instructions from your health care provider about how to take care of your radial site wound. Check the wound every day for signs of infection.  Do not lift anything that is heavier than 10 lb (4.5 kg) for 5 days. This information is not intended to replace advice given to you by your health care provider. Make sure you discuss any questions you have with your health care provider. Document Released: 07/04/2010 Document Revised: 07/07/2017 Document Reviewed: 07/07/2017 Elsevier Patient Education  2020 [employer].

## 2019-01-31 LAB — AMIODARONE LEVEL
Amiodarone Lvl: NOT DETECTED ug/mL (ref 1.0–2.5)
N-Desethyl-Amiodarone: NOT DETECTED ug/mL (ref 1.0–2.5)

## 2019-02-01 ENCOUNTER — Other Ambulatory Visit: Payer: Self-pay | Admitting: Internal Medicine

## 2019-02-01 ENCOUNTER — Telehealth: Payer: Self-pay

## 2019-02-01 DIAGNOSIS — E118 Type 2 diabetes mellitus with unspecified complications: Secondary | ICD-10-CM

## 2019-02-01 DIAGNOSIS — E785 Hyperlipidemia, unspecified: Secondary | ICD-10-CM

## 2019-02-01 NOTE — Telephone Encounter (Signed)
Spoke to patient appointment scheduled with [facility] 02/21/19 at 10:00 am.

## 2019-02-11 ENCOUNTER — Other Ambulatory Visit: Payer: Self-pay | Admitting: Cardiology

## 2019-02-13 ENCOUNTER — Ambulatory Visit: Payer: Medicare HMO | Admitting: Physician Assistant

## 2019-02-21 ENCOUNTER — Ambulatory Visit (INDEPENDENT_AMBULATORY_CARE_PROVIDER_SITE_OTHER): Payer: Medicare HMO | Admitting: Cardiology

## 2019-02-21 ENCOUNTER — Other Ambulatory Visit: Payer: Self-pay

## 2019-02-21 ENCOUNTER — Encounter: Payer: Self-pay | Admitting: Cardiology

## 2019-02-21 VITALS — BP 108/60 | HR 92 | Ht 77.0 in | Wt 297.2 lb

## 2019-02-21 DIAGNOSIS — I4819 Other persistent atrial fibrillation: Secondary | ICD-10-CM | POA: Diagnosis not present

## 2019-02-21 NOTE — Patient Instructions (Signed)
Medication Instructions:  Your physician recommends that you continue on your current medications as directed. Please refer to the Current Medication list given to you today.  * If you need a refill on your cardiac medications before your next appointment, please call your pharmacy.   Labwork: None ordered If you have labs (blood work) drawn today and your tests are completely normal, you will receive your results only by:  [place] (if you have MyChart) OR  A paper copy in the mail If you have any lab test that is abnormal or we need to change your treatment, we will call you to review the results.  Testing/Procedures: None ordered  Follow-Up: To be determined once Dr. Curt Bears and Dr. [name] further discuss your case.  We will be in touch with a plan.   Thank you for choosing [facility]!!   Trinidad Curet, RN [phone]  Any Other Special Instructions Will Be Listed Below (If Applicable).

## 2019-02-21 NOTE — Progress Notes (Signed)
Electrophysiology Office Note   Date:  02/21/2019   ID:  Clinton Black, Clinton Black 08/03/1945, MRN [id number]  PCP:  [name], MD  Cardiologist:  [name] Primary Electrophysiologist:  [name], MD    Chief Complaint: AF   History of Present Illness: Clinton Black is a 73 y.o. male who is being seen today for the evaluation of AF at the request of [name]. Presenting today for electrophysiology evaluation.  He has a history of thoracic aortic aneurysm, type 2 diabetes, morbid obesity, hypertension, hyperlipidemia, and atrial fibrillation.  He had a cardioversion on 07/12/2018.  He says that he felt like he went back out of rhythm 1 week later.  While he was in sinus rhythm he had improved energy.  Known with an attempted cardioversion on March 9 which was unsuccessful.  He is currently being evaluated for aortic surgery for a thoracic aortic aneurysm.    Today, he denies symptoms of palpitations, chest pain, orthopnea, PND, lower extremity edema, claudication, dizziness, presyncope, syncope, bleeding, or neurologic sequela. The patient is tolerating medications without difficulties.  His main complaint is weakness, fatigue, and shortness of breath.  He says that he can walk a few steps without getting short of breath.  He can no longer exercise.  After his cardioversion, he felt much improved.   Past Medical History:  Diagnosis Date  . Arthritis   . BPH (benign prostatic hyperplasia)   . Diabetes mellitus type 2 in obese (HCC)    diet controlled  . HTN (hypertension)   . Hyperlipidemia, mixed   . Insomnia   . Obesity   . Thoracic aortic aneurysm, without rupture (HCC)    4.7 cm per chest ct with contrast 11-04-17   Past Surgical History:  Procedure Laterality Date  . CARDIOVERSION N/A 07/12/2018   Procedure: CARDIOVERSION;  Surgeon: Acie Fredrickson Wonda Cheng, MD;  Location: [facility] ENDOSCOPY;  Service: Cardiovascular;  Laterality: N/A;  . CARDIOVERSION N/A 08/22/2018   Procedure: CARDIOVERSION;  Surgeon: Skeet Latch, MD;  Location: [place];  Service: Cardiovascular;  Laterality: N/A;  . COLONOSCOPY  2016  . CYST EXCISION     polynomial  . CYSTOSCOPY WITH INSERTION OF UROLIFT N/A 04/18/2018   Procedure: CYSTOSCOPY WITH INSERTION OF UROLIFT;  Surgeon: Cleon Gustin, MD;  Location: [facility];  Service: Urology;  Laterality: N/A;  . KNEE SURGERY Right 2000   arthroscopy  . RIGHT/LEFT HEART CATH AND CORONARY ANGIOGRAPHY N/A 01/30/2019   Procedure: RIGHT/LEFT HEART CATH AND CORONARY ANGIOGRAPHY;  Surgeon: [name], [name], MD;  Location: [place] CV LAB;  Service: Cardiovascular;  Laterality: N/A;  . TONSILLECTOMY    . widson teeth extraction       Current Outpatient Medications  Medication Sig Dispense Refill  . APPLE CIDER VINEGAR PO Take 2 capsules by mouth at bedtime.    Marland Kitchen atorvastatin (LIPITOR) 20 MG tablet Take 1 tablet (20 mg total) by mouth at bedtime. 90 tablet 0  . Empagliflozin-metFORMIN HCl 5-500 MG TABS Take 1 tablet by mouth daily. 180 tablet 0  . GARLIC PO Take 1 tablet by mouth 4 (four) times daily.    . hydrocortisone cream 1 % Apply 1 application topically daily as needed (rash).    Marland Kitchen losartan (COZAAR) 25 MG tablet TAKE 2 TABLETS BY MOUTH EVERY DAY 180 tablet 1  . Menthol-Methyl Salicylate (SALONPAS PAIN RELIEF PATCH EX) Apply 1 patch topically daily as needed (back pain).    . metoprolol succinate (TOPROL-XL)  25 MG 24 hr tablet TAKE 1 TABLET BY MOUTH EVERY DAY 90 tablet 2  . rivaroxaban (XARELTO) 20 MG TABS tablet Take 1 tablet (20 mg total) by mouth daily with supper.    Marland Kitchen spironolactone (ALDACTONE) 25 MG tablet Take 0.5 tablets (12.5 mg total) by mouth at bedtime. 45 tablet 3  . Tetrahydrozoline HCl (VISINE OP) Place 1 drop into both eyes daily as needed (dry eyes).    . triazolam (HALCION) 0.25 MG tablet TAKE 1 TABLET (0.25 MG TOTAL) BY MOUTH AT BEDTIME AS NEEDED. 30 tablet 3  . TURMERIC PO Take 5 mLs  by mouth at bedtime.      Current Facility-Administered Medications  Medication Dose Route Frequency Provider Last Rate Last Dose  . sodium chloride flush (NS) 0.9 % injection 3 mL  3 mL Intravenous Q12H [name], [name], MD        Allergies:   Quinolones   Social History:  The patient  reports that he quit smoking about 24 years ago. His smoking use included cigarettes. He has a 7.50 pack-year smoking history. He has never used smokeless tobacco. He reports current alcohol use of about 14.0 standard drinks of alcohol per week. He reports current drug use. Drug: Marijuana.   Family History:  The patient's family history includes Aneurysm in his father; Arthritis in his mother; Heart disease in his father; Macular degeneration in his mother; Varicose Veins in his mother.    ROS:  Please see the history of present illness.   Otherwise, review of systems is positive for none.   All other systems are reviewed and negative.    PHYSICAL EXAM: VS:  BP 108/60   Pulse 92   Ht 6\' 5"  (1.956 m)   Wt 297 lb 3.2 oz (134.8 kg)   SpO2 96%   BMI 35.24 kg/m  , BMI Body mass index is 35.24 kg/m. GEN: Well nourished, well developed, in no acute distress  HEENT: normal  Neck: no JVD, carotid bruits, or masses Cardiac: RRR; no murmurs, rubs, or gallops,no edema  Respiratory:  clear to auscultation bilaterally, normal work of breathing GI: soft, nontender, nondistended, + BS MS: no deformity or atrophy  Skin: warm and dry Neuro:  Strength and sensation are intact Psych: euthymic mood, full affect  EKG:  EKG is ordered today. Personal review of the ekg ordered shows AF, rate 92,   Recent Labs: 06/01/2018: ALT 21 10/20/2018: TSH 5.35 01/25/2019: BUN 22; Creatinine, Ser 1.07; Platelets 210 01/30/2019: Hemoglobin 14.3; Potassium 4.2; Sodium 142    Lipid Panel     Component Value Date/Time   CHOL 110 06/01/2018 1328   CHOL 105 03/11/2017 1541   TRIG 137.0 06/01/2018 1328   HDL 34.30 (L)  06/01/2018 1328   HDL 36 (L) 03/11/2017 1541   CHOLHDL 3 06/01/2018 1328   VLDL 27.4 06/01/2018 1328   LDLCALC 48 06/01/2018 1328   LDLCALC 46 03/11/2017 1541   LDLDIRECT 120.0 05/11/2016 1433     Wt Readings from Last 3 Encounters:  02/21/19 297 lb 3.2 oz (134.8 kg)  01/30/19 290 lb (131.5 kg)  12/19/18 294 lb (133.4 kg)      Other studies Reviewed: Additional studies/ records that were reviewed today include: TTE 12/28/18  Review of the above records today demonstrates:   1. The left ventricle has normal systolic function with an ejection fraction of 60-65%. The cavity size was normal. There is mildly increased left ventricular wall thickness. Left ventricular diastolic Doppler parameters are  consistent with impaired  relaxation.  2. The right ventricle has normal systolic function. The cavity was normal. There is no increase in right ventricular wall thickness.  3. The aortic valve is tricuspid. Mild thickening of the aortic valve. Moderate calcification of the aortic valve. Aortic valve regurgitation is moderate by color flow Doppler.  4. There is dilatation of the ascending aorta measuring 49 mm.  LHC [id number]  LV end diastolic pressure is normal.  There is moderate (3+) aortic regurgitation.   1. Normal coronary anatomy. Left dominant circulation 2. Normal right heart pressures 3. Normal cardiac output.  4. Moderate aortic insufficiency. No significant stenosis 5. Ascending thoracic aortic aneurysm.   ASSESSMENT AND PLAN:  1.  Persistent atrial fibrillation: Currently on Xarelto.  He does get symptoms of shortness of breath, weakness, and fatigue.  I do think that he would benefit from being in sinus rhythm.  I discussed with him further options including ablation.  Risks and benefits include bleeding, tamponade, infection, pneumothorax.  The patient understands the risks and is agreed to the procedure.  That being said, with his thoracic aortic aneurysm, if there is a  plan for surgery in the future, he would likely benefit from potential surgical ablation.  We  discuss this with his primary cardiologist and surgeon.  This patients CHA2DS2-VASc Score and unadjusted Ischemic Stroke Rate (% per year) is equal to 4.8 % stroke rate/year from a score of 4  Above score calculated as 1 point each if present [CHF, HTN, DM, Vascular=MI/PAD/Aortic Plaque, Age if 65-74, or Male] Above score calculated as 2 points each if present [Age > 75, or Stroke/TIA/TE]  2.  Thoracic aortic aneurysm: Followed by cardiac surgery.  3.  Hypertension:well controlled  4.  Hyperlipidemia: Lipitor per primary cardiology    Current medicines are reviewed at length with the patient today.   The patient does not have concerns regarding his medicines.  The following changes were made today:  none  Labs/ tests ordered today include:  Orders Placed This Encounter  Procedures  . EKG 12-Lead     Disposition:   FU with   3 months  Signed,  Meredith Leeds, MD  02/21/2019 10:20 AM     [facility] [address] [place] [address] [phone] (office) [phone] (fax)

## 2019-02-23 ENCOUNTER — Ambulatory Visit: Payer: Medicare HMO | Admitting: Cardiology

## 2019-02-28 ENCOUNTER — Other Ambulatory Visit: Payer: Self-pay

## 2019-02-28 ENCOUNTER — Ambulatory Visit (INDEPENDENT_AMBULATORY_CARE_PROVIDER_SITE_OTHER): Payer: Medicare HMO | Admitting: Physician Assistant

## 2019-02-28 ENCOUNTER — Encounter: Payer: Self-pay | Admitting: Physician Assistant

## 2019-02-28 VITALS — BP 125/73 | HR 101 | Temp 97.3°F | Ht 77.0 in | Wt 295.0 lb

## 2019-02-28 DIAGNOSIS — E785 Hyperlipidemia, unspecified: Secondary | ICD-10-CM | POA: Diagnosis not present

## 2019-02-28 DIAGNOSIS — I712 Thoracic aortic aneurysm, without rupture, unspecified: Secondary | ICD-10-CM

## 2019-02-28 DIAGNOSIS — I4819 Other persistent atrial fibrillation: Secondary | ICD-10-CM

## 2019-02-28 DIAGNOSIS — E119 Type 2 diabetes mellitus without complications: Secondary | ICD-10-CM

## 2019-02-28 DIAGNOSIS — I10 Essential (primary) hypertension: Secondary | ICD-10-CM

## 2019-02-28 NOTE — Patient Instructions (Addendum)
Medication Instructions:  Your physician recommends that you continue on your current medications as directed. Please refer to the Current Medication list given to you today. If you need a refill on your cardiac medications before your next appointment, please call your pharmacy.   Lab work: None  If you have labs (blood work) drawn today and your tests are completely normal, you will receive your results only by: Marland Kitchen MyChart Message (if you have MyChart) OR . A paper copy in the mail If you have any lab test that is abnormal or we need to change your treatment, we will call you to review the results.  Testing/Procedures: None   Follow-Up: At [facility], you and your health needs are our priority.  As part of our continuing mission to provide you with exceptional heart care, we have created designated Provider Care Teams.  These Care Teams include your primary Cardiologist (physician) and Advanced Practice Providers (APPs -  Physician Assistants and Nurse Practitioners) who all work together to provide you with the care you need, when you need it. . FOLLOW UP WITH DR [name] AS SCHEDULED  Any Other Special Instructions Will Be Listed Below (If Applicable). HAO WILL REACH OUT TO DR CAMNITZ ABOUT YOUR ABLATION

## 2019-02-28 NOTE — Progress Notes (Addendum)
Cardiology Office Note    Date:  03/01/2019   ID:  Steed, Babbit 08-Apr-1946, MRN [id number]  PCP:  [name], MD  Cardiologist: Dr. [name]  Chief Complaint  Patient presents with  . Follow-up    seen for Dr. [name]    History of Present Illness:  Clinton Black is a 73 y.o. male with PMH of DM II, HTN, HLD, TAA, morbid obesity, and persistent A. fib.  Myoview obtained on 02/12/2017 showed EF 39%, no ischemia, intermediate risk study due to reduced systolic function.  Echocardiogram obtained showed normal EF 55%.  Previous echocardiogram in October 2019 showed EF 50 to 55%, grade 1 DD, dilated thoracic aorta measuring 5 cm, moderate AI.  He was diagnosed with atrial fibrillation in December 2019 and was started on Xarelto.  He underwent successful DC cardioversion on 07/12/2018.  He felt better after the cardioversion however quickly went back into A. fib.  He was seen in the A. fib clinic and was loaded with amiodarone therapy.  Repeat attempt at DC cardioversion in March was unsuccessful.  Amiodarone was subsequently discontinued.  Options include Tikosyn and amiodarone were discussed.  He was seen back in the ED near the end of March was facial droop secondary to Bell's palsy.  MRI was negative for CVA.  Cardiac catheterization performed 03/02/2019 showed normal coronary anatomy, moderate aortic regurgitation.  He was seen by Dr. Curt Bears on 9/8, we will plan for atrial fibrillation ablation.  Patient presents today for cardiology office visit.  I reached out to Dr. [name] who says CT surgery do not recommend thoracic aortic aneurysm repair or valve repair at this time.  He continued to be symptomatic while in atrial fibrillation.  He may proceed with atrial fibrillation ablation.  I will reach out to Dr. Curt Bears to figure out the timing of A. fib ablation.  Otherwise he has no lower extremity edema or crackles in the lung to suggest volume overload despite his dyspnea on exertion.   I suspect his symptom is related to atrial fibrillation.   Past Medical History:  Diagnosis Date  . Arthritis   . BPH (benign prostatic hyperplasia)   . Diabetes mellitus type 2 in obese (HCC)    diet controlled  . HTN (hypertension)   . Hyperlipidemia, mixed   . Insomnia   . Obesity   . Thoracic aortic aneurysm, without rupture (HCC)    4.7 cm per chest ct with contrast 11-04-17    Past Surgical History:  Procedure Laterality Date  . CARDIOVERSION N/A 07/12/2018   Procedure: CARDIOVERSION;  Surgeon: Acie Fredrickson Wonda Cheng, MD;  Location: [facility] ENDOSCOPY;  Service: Cardiovascular;  Laterality: N/A;  . CARDIOVERSION N/A 08/22/2018   Procedure: CARDIOVERSION;  Surgeon: Skeet Latch, MD;  Location: [place];  Service: Cardiovascular;  Laterality: N/A;  . COLONOSCOPY  2016  . CYST EXCISION     polynomial  . CYSTOSCOPY WITH INSERTION OF UROLIFT N/A 04/18/2018   Procedure: CYSTOSCOPY WITH INSERTION OF UROLIFT;  Surgeon: Cleon Gustin, MD;  Location: [facility];  Service: Urology;  Laterality: N/A;  . KNEE SURGERY Right 2000   arthroscopy  . RIGHT/LEFT HEART CATH AND CORONARY ANGIOGRAPHY N/A 01/30/2019   Procedure: RIGHT/LEFT HEART CATH AND CORONARY ANGIOGRAPHY;  Surgeon: [name], [name], MD;  Location: [place] CV LAB;  Service: Cardiovascular;  Laterality: N/A;  . TONSILLECTOMY    . widson teeth extraction      Current Medications: Outpatient Medications Prior to  Visit  Medication Sig Dispense Refill  . APPLE CIDER VINEGAR PO Take 2 capsules by mouth at bedtime.    Marland Kitchen atorvastatin (LIPITOR) 20 MG tablet Take 1 tablet (20 mg total) by mouth at bedtime. 90 tablet 0  . Empagliflozin-metFORMIN HCl 5-500 MG TABS Take 1 tablet by mouth daily. 180 tablet 0  . GARLIC PO Take 1 tablet by mouth 4 (four) times daily.    . hydrocortisone cream 1 % Apply 1 application topically daily as needed (rash).    Marland Kitchen losartan (COZAAR) 25 MG tablet TAKE 2 TABLETS BY MOUTH EVERY DAY  180 tablet 1  . Menthol-Methyl Salicylate (SALONPAS PAIN RELIEF PATCH EX) Apply 1 patch topically daily as needed (back pain).    . metoprolol succinate (TOPROL-XL) 25 MG 24 hr tablet TAKE 1 TABLET BY MOUTH EVERY DAY 90 tablet 2  . rivaroxaban (XARELTO) 20 MG TABS tablet Take 1 tablet (20 mg total) by mouth daily with supper.    Marland Kitchen spironolactone (ALDACTONE) 25 MG tablet Take 0.5 tablets (12.5 mg total) by mouth at bedtime. 45 tablet 3  . Tetrahydrozoline HCl (VISINE OP) Place 1 drop into both eyes daily as needed (dry eyes).    . triazolam (HALCION) 0.25 MG tablet TAKE 1 TABLET (0.25 MG TOTAL) BY MOUTH AT BEDTIME AS NEEDED. 30 tablet 3  . TURMERIC PO Take 5 mLs by mouth at bedtime.      Facility-Administered Medications Prior to Visit  Medication Dose Route Frequency Provider Last Rate Last Dose  . sodium chloride flush (NS) 0.9 % injection 3 mL  3 mL Intravenous Q12H [name], [name], MD         Allergies:   Quinolones   Social History   Socioeconomic History  . Marital status: Single    Spouse name: Not on file  . Number of children: Not on file  . Years of education: Not on file  . Highest education level: Not on file  Occupational History  . Not on file  Social Needs  . Financial resource strain: Not on file  . Food insecurity    Worry: Not on file    Inability: Not on file  . Transportation needs    Medical: Not on file    Non-medical: Not on file  Tobacco Use  . Smoking status: Former Smoker    Packs/day: 1.50    Years: 5.00    Pack years: 7.50    Types: Cigarettes    Quit date: 06/15/1994    Years since quitting: 24.7  . Smokeless tobacco: Never Used  Substance and Sexual Activity  . Alcohol use: Yes    Alcohol/week: 14.0 standard drinks    Types: 14 Shots of liquor per week    Comment: 1 drink of liquor per day  . Drug use: Yes    Types: Marijuana    Comment: occasionally marijuana  . Sexual activity: Yes    Partners: Female    Birth control/protection:  Condom    Comment: condom use most of the time but not always  Lifestyle  . Physical activity    Days per week: Not on file    Minutes per session: Not on file  . Stress: Not on file  Relationships  . Social Herbalist on phone: Not on file    Gets together: Not on file    Attends religious service: Not on file    Active member of club or organization: Not on file  Attends meetings of clubs or organizations: Not on file    Relationship status: Not on file  Other Topics Concern  . Not on file  [place] [employer]. married '72- 3 years/divorced; married '89- 3 years/divorced;. married '03 - 3 years/divorced. 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     Family History:  The patient's family history includes Aneurysm in his father; Arthritis in his mother; Heart disease in his father; Macular degeneration in his mother; Varicose Veins in his mother.   ROS:   Please see the history of present illness.    ROS All other systems reviewed and are negative.   PHYSICAL EXAM:   VS:  BP 125/73   Pulse (!) 101   Temp (!) 97.3 F (36.3 C) (Temporal)   Ht 6\' 5"  (1.956 m)   Wt 295 lb (133.8 kg)   SpO2 95%   BMI 34.98 kg/m    GEN: Well nourished, well developed, in no acute distress  HEENT: normal  Neck: no JVD, carotid bruits, or masses Cardiac: Irregularly irregular; no murmurs, rubs, or gallops,no edema  Respiratory:  clear to auscultation bilaterally, normal work of breathing GI: soft, nontender, nondistended, + BS MS: no deformity or atrophy  Skin: warm and dry, no rash Neuro:  Alert and Oriented x 3, Strength and sensation are intact Psych: euthymic mood, full affect  Wt Readings from Last 3 Encounters:  02/28/19 295 lb (133.8 kg)  02/21/19 297 lb 3.2 oz (134.8 kg)  01/30/19 290 lb (131.5 kg)      Studies/Labs Reviewed:   EKG:  EKG is not ordered today.   Recent Labs: 06/01/2018: ALT 21 10/20/2018: TSH 5.35 01/25/2019: BUN  22; Creatinine, Ser 1.07; Platelets 210 01/30/2019: Hemoglobin 14.3; Potassium 4.2; Sodium 142   Lipid Panel    Component Value Date/Time   CHOL 110 06/01/2018 1328   CHOL 105 03/11/2017 1541   TRIG 137.0 06/01/2018 1328   HDL 34.30 (L) 06/01/2018 1328   HDL 36 (L) 03/11/2017 1541   CHOLHDL 3 06/01/2018 1328   VLDL 27.4 06/01/2018 1328   LDLCALC 48 06/01/2018 1328   LDLCALC 46 03/11/2017 1541   LDLDIRECT 120.0 05/11/2016 1433    Additional studies/ records that were reviewed today include:   Echo 03/29/2018 LV EF: 50% -   55% Study Conclusions  - Left ventricle: The cavity size was normal. There was moderate   basal hypertrophy of the septum with otherwise mild concentric   hypertrophy. Systolic function was normal. The estimated ejection   fraction was in the range of 50% to 55%. Wall motion was normal;   there were no regional wall motion abnormalities. Doppler   parameters are consistent with abnormal left ventricular   relaxation (grade 1 diastolic dysfunction). - Aortic valve: Transvalvular velocity was within the normal range.   There was no stenosis. There was moderate regurgitation. - Aorta: Ascending aortic diameter: 50 mm (S). 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 - Mitral valve: Transvalvular velocity was within the normal range.   There was no evidence for stenosis. There was trivial   regurgitation. - Left atrium: The atrium was moderately dilated. - Right ventricle: The cavity size was normal. Wall thickness was   normal. Systolic function was normal. - Pulmonary arteries: Systolic pressure was within the normal   range. PA peak pressure: 24 mm Hg (S).  Impressions:  - Compared with the echo 05/2017, the ascending aorta aneurysm has  increased from 4.7 cm to 5.0 cm.    Cath [id number]  LV end diastolic pressure is normal.  There is moderate (3+) aortic regurgitation.   1. Normal coronary anatomy. Left dominant circulation  2. Normal right heart pressures 3. Normal cardiac output.  4. Moderate aortic insufficiency. No significant stenosis 5. Ascending thoracic aortic aneurysm.   Plan: will review with Dr Servando Snare. If Aortic surgery is not planned in the near future would recommend referral to EP for consideration of ablation of Afib.    ASSESSMENT:    1. Persistent atrial fibrillation   2. Essential hypertension   3. Hyperlipidemia LDL goal <130   4. Controlled type 2 diabetes mellitus without complication, without long-term current use of insulin ([place])   5. Thoracic aortic aneurysm without rupture ([place])   6. Morbid obesity ([place])      PLAN:  In order of problems listed above:  1. Persistent atrial fibrillation: I discussed the case with Dr. [name], we will proceed with atrial fibrillation ablation.  I will reach out to Dr. Curt Bears to determine the best time.  Currently there is no plan to proceed with thoracic aortic aneurysm repair by CT surgery.  2. Thoracic aortic aneurysm: Moderate aortic regurgitation seen on previous echocardiogram.  Dr. [name] has already discussed with Dr. Creta Levin who do not recommend surgery at this time.  Thoracic aortic aneurysm measuring up to 5 cm  3. Hypertension: Blood pressure stable  4. Hyperlipidemia: Continue Lipitor.  5. DM2: Managed by primary care provider  6. Morbid obesity: Weight loss is imperative.    Medication Adjustments/Labs and Tests Ordered: Current medicines are reviewed at length with the patient today.  Concerns regarding medicines are outlined above.  Medication changes, Labs and Tests ordered today are listed in the Patient Instructions below. Patient Instructions  Medication Instructions:  Your physician recommends that you continue on your current medications as directed. Please refer to the Current Medication list given to you today. If you need a refill on your cardiac medications before your next appointment, please call your pharmacy.    Lab work: None  If you have labs (blood work) drawn today and your tests are completely normal, you will receive your results only by: Marland Kitchen MyChart Message (if you have MyChart) OR . A paper copy in the mail If you have any lab test that is abnormal or we need to change your treatment, we will call you to review the results.  Testing/Procedures: None   Follow-Up: At [facility], you and your health needs are our priority.  As part of our continuing mission to provide you with exceptional heart care, we have created designated Provider Care Teams.  These Care Teams include your primary Cardiologist (physician) and Advanced Practice Providers (APPs -  Physician Assistants and Nurse Practitioners) who all work together to provide you with the care you need, when you need it. . FOLLOW UP WITH DR [name] AS SCHEDULED  Any Other Special Instructions Will Be Listed Below (If Applicable).  WILL REACH OUT TO DR Marriott ABOUT YOUR ABLATION     Signed, Almyra Deforest, [place]  03/01/2019 10:58 PM    [address], [place], [place]  [address] Phone: [phone]; Fax: [phone]

## 2019-03-01 ENCOUNTER — Encounter: Payer: Self-pay | Admitting: Physician Assistant

## 2019-03-10 ENCOUNTER — Encounter: Payer: Self-pay | Admitting: Internal Medicine

## 2019-03-16 ENCOUNTER — Telehealth: Payer: Self-pay | Admitting: *Deleted

## 2019-03-16 DIAGNOSIS — I4819 Other persistent atrial fibrillation: Secondary | ICD-10-CM

## 2019-03-16 NOTE — Telephone Encounter (Signed)
Scheduled AFib ablation for 10/23. Pt aware I will follow up with him next week to go over instructions.

## 2019-03-23 ENCOUNTER — Encounter: Payer: Self-pay | Admitting: Internal Medicine

## 2019-03-23 LAB — HM DIABETES EYE EXAM

## 2019-03-30 ENCOUNTER — Other Ambulatory Visit: Payer: Self-pay | Admitting: *Deleted

## 2019-03-30 DIAGNOSIS — I4891 Unspecified atrial fibrillation: Secondary | ICD-10-CM

## 2019-03-30 DIAGNOSIS — D4981 Neoplasm of unspecified behavior of retina and choroid: Secondary | ICD-10-CM | POA: Diagnosis not present

## 2019-03-30 DIAGNOSIS — H04123 Dry eye syndrome of bilateral lacrimal glands: Secondary | ICD-10-CM | POA: Diagnosis not present

## 2019-03-30 DIAGNOSIS — Z01812 Encounter for preprocedural laboratory examination: Secondary | ICD-10-CM | POA: Diagnosis not present

## 2019-03-30 DIAGNOSIS — E113292 Type 2 diabetes mellitus with mild nonproliferative diabetic retinopathy without macular edema, left eye: Secondary | ICD-10-CM | POA: Diagnosis not present

## 2019-03-30 DIAGNOSIS — H35033 Hypertensive retinopathy, bilateral: Secondary | ICD-10-CM | POA: Diagnosis not present

## 2019-03-31 ENCOUNTER — Telehealth (HOSPITAL_COMMUNITY): Payer: Self-pay | Admitting: Emergency Medicine

## 2019-03-31 LAB — CBC
Hematocrit: 47.8 % (ref 37.5–51.0)
Hemoglobin: 15.8 g/dL (ref 13.0–17.7)
MCH: 29.5 pg (ref 26.6–33.0)
MCHC: 33.1 g/dL (ref 31.5–35.7)
MCV: 89 fL (ref 79–97)
Platelets: 205 10*3/uL (ref 150–450)
RBC: 5.36 x10E6/uL (ref 4.14–5.80)
RDW: 13 % (ref 11.6–15.4)
WBC: 8.4 10*3/uL (ref 3.4–10.8)

## 2019-03-31 LAB — BASIC METABOLIC PANEL
BUN/Creatinine Ratio: 21 (ref 10–24)
BUN: 23 mg/dL (ref 8–27)
CO2: 19 mmol/L — ABNORMAL LOW (ref 20–29)
Calcium: 9.6 mg/dL (ref 8.6–10.2)
Chloride: 106 mmol/L (ref 96–106)
Creatinine, Ser: 1.12 mg/dL (ref 0.76–1.27)
GFR calc Af Amer: 76 mL/min/{1.73_m2} (ref >59)
GFR calc non Af Amer: 66 mL/min/{1.73_m2} (ref >59)
Glucose: 134 mg/dL — ABNORMAL HIGH (ref 65–99)
Potassium: 4.9 mmol/L (ref 3.5–5.2)
Sodium: 140 mmol/L (ref 134–144)

## 2019-03-31 NOTE — Telephone Encounter (Signed)
Left message on voicemail with name and callback number   RN Navigator Cardiac Imaging [facility] [phone] Office [phone] Cell  

## 2019-04-03 ENCOUNTER — Ambulatory Visit (HOSPITAL_COMMUNITY)
Admission: RE | Admit: 2019-04-03 | Discharge: 2019-04-03 | Disposition: A | Discharge status: Home / Self Care | Payer: Medicare HMO | Source: Ambulatory Visit | Attending: Cardiology | Admitting: Cardiology

## 2019-04-03 ENCOUNTER — Inpatient Hospital Stay (HOSPITAL_COMMUNITY): Admission: RE | Admit: 2019-04-03 | Payer: Medicare HMO | Source: Ambulatory Visit

## 2019-04-03 ENCOUNTER — Other Ambulatory Visit: Payer: Self-pay

## 2019-04-03 DIAGNOSIS — I4819 Other persistent atrial fibrillation: Secondary | ICD-10-CM | POA: Diagnosis not present

## 2019-04-03 IMAGING — CT CT HEART MORPH/PULM VEIN W/ CM & W/O CA SCORE
3 of 6 series · 11 of 20 positions shown, 12 images · IV contrast (APPLIED)
Comparison: [DATE] CTA of the chest.
COMPARISON: [DATE] CTA of the chest.

Addendum:
EXAM:
OVER-READ INTERPRETATION  CT CHEST

The following report is an over-read performed by radiologist Dr.
over-read does not include interpretation of cardiac or coronary
anatomy or pathology. The coronary CTA interpretation by the
cardiologist is attached.
CLINICAL DATA: Atrial fibrillation scheduled for an ablation.
Cardiac CT/CTA
TECHNIQUE: The patient was scanned on a Siemens Force [REDACTED]ice  scanner.

[Series 7: best diast · axial · 0.43mm/px · z∈[-203,-105]mm · 4 of 412 slices shown, 5 images]
[im 83/412  vessel]
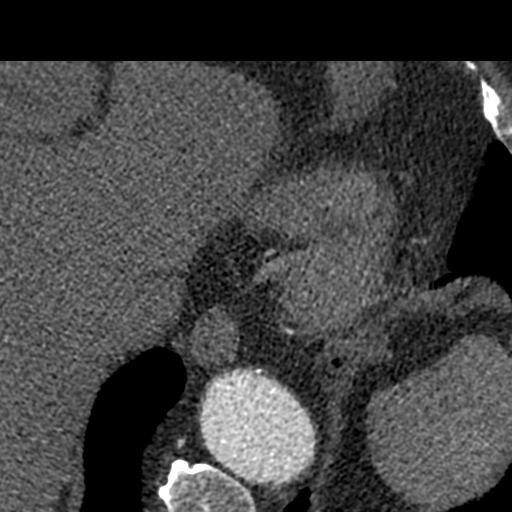
[im 83/412  lung]
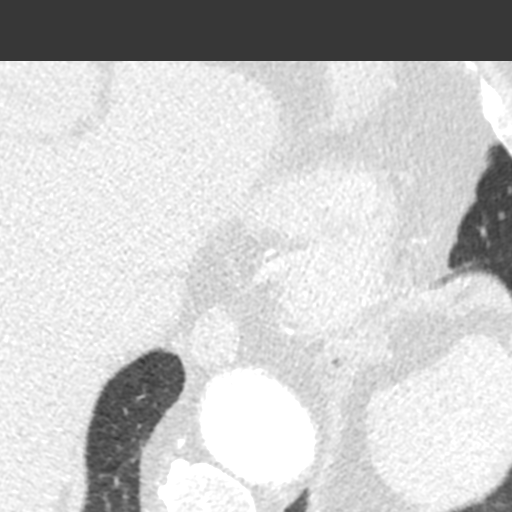
[im 165/412  vessel]
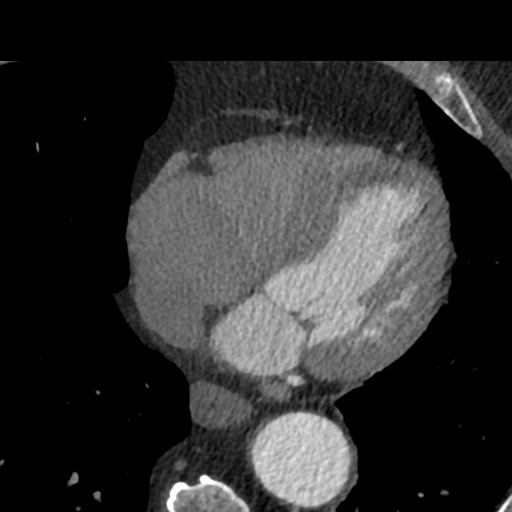
[im 247/412  vessel]
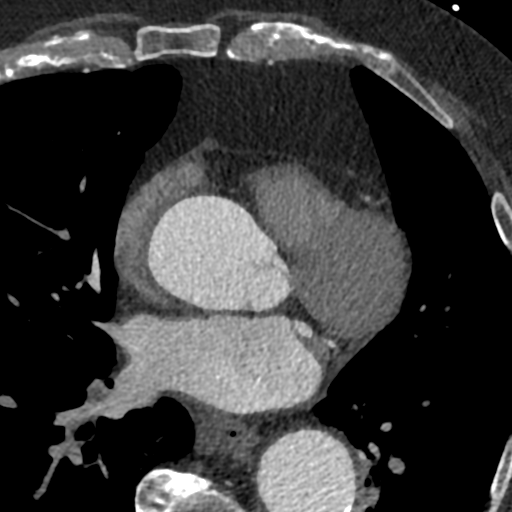
[im 329/412  vessel]
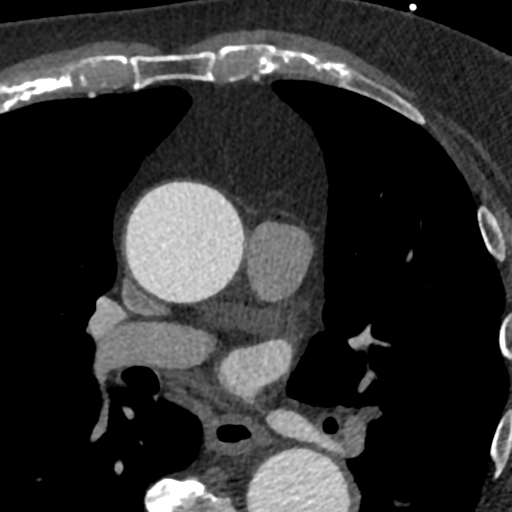

[Series 8: +300 ms · axial · 0.43mm/px · z∈[-203,-105]mm · 4 of 412 slices shown]
[im 83/412  vessel]
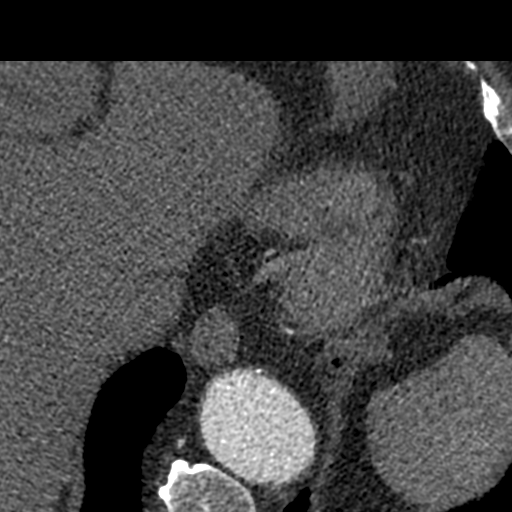
[im 165/412  vessel]
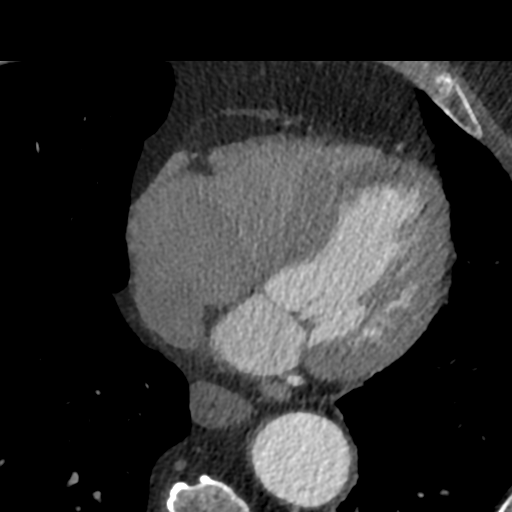
[im 247/412  vessel]
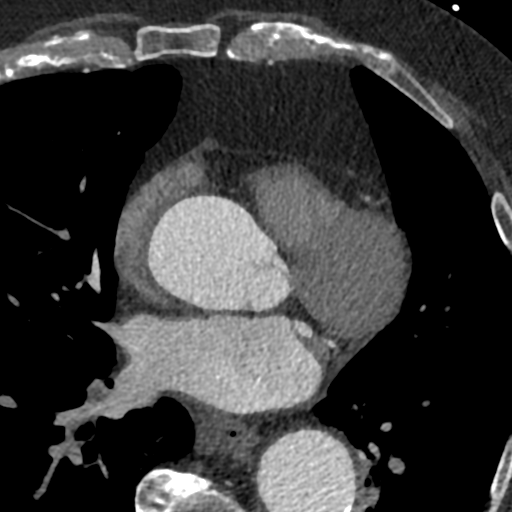
[im 329/412  vessel]
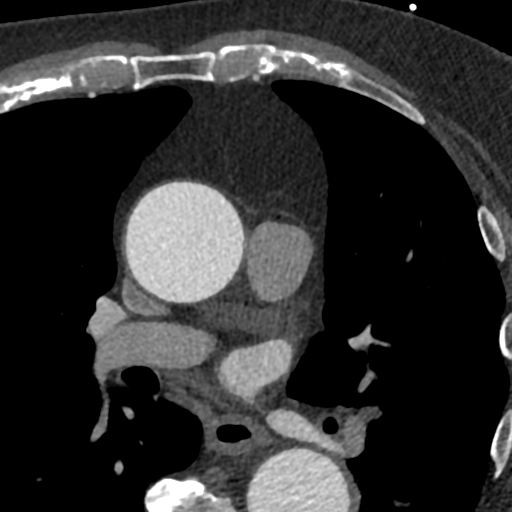

[Series 13: +(id) delay · axial · delayed · 0.44mm/px · z∈[-184,-109]mm · 3 of 301 slices shown]
[im 76/301  vessel]
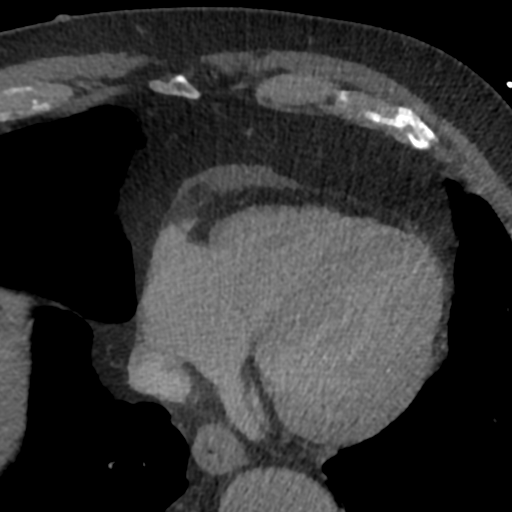
[im 151/301  vessel]
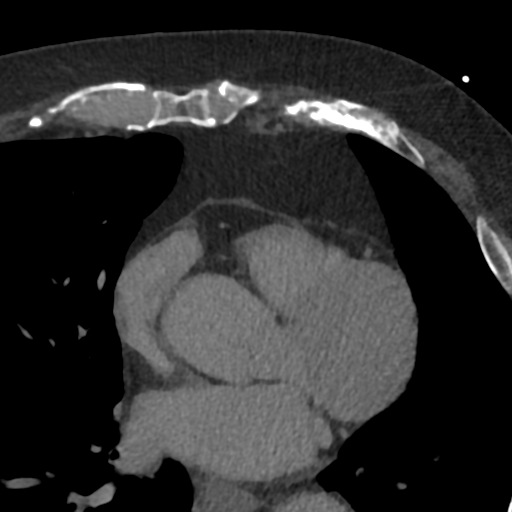
[im 226/301  vessel]
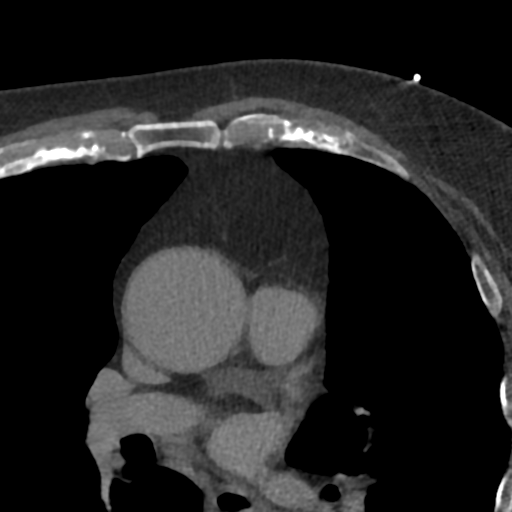

[11 of 20 positions shown; findings below may reference images not displayed]

FINDINGS: Vascular: Ascending aortic dilatation. Example 5.2 cm on [DATE],
similar to [DATE]. Based on coronal reformats, 4.7 cm today,
similar. Tortuosity of the descending segment. Aortic
atherosclerosis.

No central pulmonary embolism, on this non-dedicated study.

Mediastinum/Nodes: No imaged thoracic adenopathy.

Lungs/Pleura: No imaged pleural fluid. Pleural-based right lower
lobe pulmonary nodule of 6 mm on [DATE] is similar, including on
[DATE] and considered benign.

Left lower lobe scarring.

Minimal nodularity along the left major fissure on [DATE] is likely a
subpleural lymph node.

Upper Abdomen: Motion degradation. Suspicion of mild hepatic
steatosis.

Musculoskeletal: Midthoracic spondylosis.
IMPRESSION: 1.  No acute findings in the imaged extracardiac chest.
2. Similar ascending aortic aneurysm compared to [DATE].
Recommend semi-annual imaging followup by CTA or MRA and referral to
cardiothoracic surgery if not already obtained. This recommendation
follows [LI] ACCF/AHA/AATS/ACR/ASA/SCA/MAITTE/MAITTE/MAITTE/MAITTE Guidelines
for the Diagnosis and Management of Patients With Thoracic Aortic
Disease. Circulation. [LI]; 121: [id number]. Aortic aneurysm NOS
([LI]-[LI])
3. Suspicion of mild hepatic steatosis.
4.  Aortic Atherosclerosis ([LI]-[LI]).
FINDINGS: A 120 kV prospective scan was triggered in the ascending thoracic
aorta at 140 HU's. Gantry rotation speed was 250 msecs and
collimation was .6 mm. No beta blockade and no NTG was given. The 3D
data set was reconstructed for best systolic and diastolic phases
along with delayed images of the MAITTE Images analyzed on a dedicated
work station using MPR, MIP and VRT modes. The patient received 80
cc of contrast.

Normal LA size moderate RA enlargement. No PFO/ASD. No MAITTE thrombus.
Severe aortic root enlargement 5.2 cm. Tri leaflet aortic valve with
central area of non coaptation No pericardial effusion

Normal PV anatomy with early branching of a large RSPV.

RUPV: Ostium 22.8 mm  area 4.4 cm2

RLPV:  Ostium 19 mm  area 2.6 cm2

LUPV:  Ostium 18.6 mm area 2.74 cm2

LLPV:  Ostium 16 mm  area 1.9 cm2

Calcium score Calcium noted in LAD. Study not suitable for
evaluation of coronary arteries due to poor opacification and afib
IMPRESSION: 1.  Moderate MAITTE with no MAITTE thrombus

2. Severe aortic root enlargement 5.2 cm in setting of tri leaflet
AV

3.  Normal PV anatomy see measurements as above

4.  No pericardial effusion

5.  No ASD/PFO

6. Calcium score 163 isolated to LAD this is 50 th percentile for
age and sex

MAITTE

*** End of Addendum ***
EXAM:
OVER-READ INTERPRETATION  CT CHEST

The following report is an over-read performed by radiologist Dr.
over-read does not include interpretation of cardiac or coronary
anatomy or pathology. The coronary CTA interpretation by the
cardiologist is attached.
FINDINGS: Vascular: Ascending aortic dilatation. Example 5.2 cm on [DATE],
similar to [DATE]. Based on coronal reformats, 4.7 cm today,
similar. Tortuosity of the descending segment. Aortic
atherosclerosis.

No central pulmonary embolism, on this non-dedicated study.

Mediastinum/Nodes: No imaged thoracic adenopathy.

Lungs/Pleura: No imaged pleural fluid. Pleural-based right lower
lobe pulmonary nodule of 6 mm on [DATE] is similar, including on
[DATE] and considered benign.

Left lower lobe scarring.

Minimal nodularity along the left major fissure on [DATE] is likely a
subpleural lymph node.

Upper Abdomen: Motion degradation. Suspicion of mild hepatic
steatosis.

Musculoskeletal: Midthoracic spondylosis.
IMPRESSION: 1.  No acute findings in the imaged extracardiac chest.
2. Similar ascending aortic aneurysm compared to [DATE].
Recommend semi-annual imaging followup by CTA or MRA and referral to
cardiothoracic surgery if not already obtained. This recommendation
follows [LI] ACCF/AHA/AATS/ACR/ASA/SCA/MAITTE/MAITTE/MAITTE/MAITTE Guidelines
for the Diagnosis and Management of Patients With Thoracic Aortic
Disease. Circulation. [LI]; 121: [id number]. Aortic aneurysm NOS
([LI]-[LI])
3. Suspicion of mild hepatic steatosis.
4.  Aortic Atherosclerosis ([LI]-[LI]).

## 2019-04-03 MED ORDER — IOHEXOL 350 MG/ML SOLN
80.0000 mL | Freq: Once | INTRAVENOUS | Status: AC | PRN
  Administered 2019-04-03: 14:00:00 90 mL via INTRAVENOUS

## 2019-04-04 ENCOUNTER — Other Ambulatory Visit (HOSPITAL_COMMUNITY)
Admission: RE | Admit: 2019-04-04 | Discharge: 2019-04-04 | Disposition: A | Discharge status: Home / Self Care | Payer: Medicare HMO | Source: Ambulatory Visit | Attending: Cardiology | Admitting: Cardiology

## 2019-04-04 DIAGNOSIS — Z01812 Encounter for preprocedural laboratory examination: Secondary | ICD-10-CM | POA: Insufficient documentation

## 2019-04-04 DIAGNOSIS — Z20828 Contact with and (suspected) exposure to other viral communicable diseases: Secondary | ICD-10-CM | POA: Insufficient documentation

## 2019-04-05 LAB — NOVEL CORONAVIRUS, NAA (HOSP ORDER, SEND-OUT TO REF LAB; TAT 18-24 HRS): SARS-CoV-2, NAA: NOT DETECTED

## 2019-04-07 ENCOUNTER — Ambulatory Visit (HOSPITAL_COMMUNITY)
Admission: RE | Admit: 2019-04-07 | Discharge: 2019-04-07 | Disposition: A | Discharge status: Home / Self Care | Payer: Medicare HMO | Source: Ambulatory Visit | Attending: Cardiology | Admitting: Cardiology

## 2019-04-07 ENCOUNTER — Ambulatory Visit (HOSPITAL_COMMUNITY): Payer: Medicare HMO | Admitting: Certified Registered"

## 2019-04-07 ENCOUNTER — Encounter (HOSPITAL_COMMUNITY)
Admission: RE | Disposition: A | Discharge status: Home / Self Care | Payer: Self-pay | Source: Ambulatory Visit | Attending: Cardiology

## 2019-04-07 ENCOUNTER — Other Ambulatory Visit: Payer: Self-pay

## 2019-04-07 DIAGNOSIS — Z7289 Other problems related to lifestyle: Secondary | ICD-10-CM | POA: Insufficient documentation

## 2019-04-07 DIAGNOSIS — I5032 Chronic diastolic (congestive) heart failure: Secondary | ICD-10-CM | POA: Diagnosis not present

## 2019-04-07 DIAGNOSIS — E119 Type 2 diabetes mellitus without complications: Secondary | ICD-10-CM | POA: Insufficient documentation

## 2019-04-07 DIAGNOSIS — Z7901 Long term (current) use of anticoagulants: Secondary | ICD-10-CM | POA: Insufficient documentation

## 2019-04-07 DIAGNOSIS — Z87891 Personal history of nicotine dependence: Secondary | ICD-10-CM | POA: Insufficient documentation

## 2019-04-07 DIAGNOSIS — Z6834 Body mass index (BMI) 34.0-34.9, adult: Secondary | ICD-10-CM | POA: Diagnosis not present

## 2019-04-07 DIAGNOSIS — E118 Type 2 diabetes mellitus with unspecified complications: Secondary | ICD-10-CM | POA: Diagnosis not present

## 2019-04-07 DIAGNOSIS — I10 Essential (primary) hypertension: Secondary | ICD-10-CM | POA: Insufficient documentation

## 2019-04-07 DIAGNOSIS — I712 Thoracic aortic aneurysm, without rupture: Secondary | ICD-10-CM | POA: Insufficient documentation

## 2019-04-07 DIAGNOSIS — M1990 Unspecified osteoarthritis, unspecified site: Secondary | ICD-10-CM | POA: Diagnosis not present

## 2019-04-07 DIAGNOSIS — I110 Hypertensive heart disease with heart failure: Secondary | ICD-10-CM | POA: Diagnosis not present

## 2019-04-07 DIAGNOSIS — F1290 Cannabis use, unspecified, uncomplicated: Secondary | ICD-10-CM | POA: Insufficient documentation

## 2019-04-07 DIAGNOSIS — N400 Enlarged prostate without lower urinary tract symptoms: Secondary | ICD-10-CM | POA: Insufficient documentation

## 2019-04-07 DIAGNOSIS — I4819 Other persistent atrial fibrillation: Secondary | ICD-10-CM | POA: Diagnosis not present

## 2019-04-07 DIAGNOSIS — I4891 Unspecified atrial fibrillation: Secondary | ICD-10-CM | POA: Diagnosis not present

## 2019-04-07 DIAGNOSIS — E785 Hyperlipidemia, unspecified: Secondary | ICD-10-CM | POA: Diagnosis not present

## 2019-04-07 HISTORY — PX: ATRIAL FIBRILLATION ABLATION: EP1191

## 2019-04-07 LAB — GLUCOSE, CAPILLARY
Glucose-Capillary: 163 mg/dL — ABNORMAL HIGH (ref 70–99)
Glucose-Capillary: 174 mg/dL — ABNORMAL HIGH (ref 70–99)

## 2019-04-07 LAB — POCT ACTIVATED CLOTTING TIME
Activated Clotting Time: 296 seconds
Activated Clotting Time: 334 seconds
Activated Clotting Time: 340 seconds

## 2019-04-07 SURGERY — ATRIAL FIBRILLATION ABLATION
Anesthesia: General

## 2019-04-07 MED ORDER — BUPIVACAINE HCL (PF) 0.25 % IJ SOLN
INTRAMUSCULAR | Status: DC | PRN
  Administered 2019-04-07: 20 mL

## 2019-04-07 MED ORDER — HEPARIN (PORCINE) IN NACL 1000-0.9 UT/500ML-% IV SOLN
INTRAVENOUS | Status: AC
  Filled 2019-04-07: qty 500

## 2019-04-07 MED ORDER — DEXMEDETOMIDINE HCL 200 MCG/2ML IV SOLN
INTRAVENOUS | Status: DC | PRN
  Administered 2019-04-07 (×3): 20 ug via INTRAVENOUS

## 2019-04-07 MED ORDER — SODIUM CHLORIDE 0.9 % IV SOLN: 250.0000 mL | INTRAVENOUS | Status: DC | PRN

## 2019-04-07 MED ORDER — MIDAZOLAM HCL 5 MG/5ML IJ SOLN
INTRAMUSCULAR | Status: DC | PRN
  Administered 2019-04-07: 2 mg via INTRAVENOUS

## 2019-04-07 MED ORDER — HEPARIN SODIUM (PORCINE) 1000 UNIT/ML IJ SOLN
INTRAMUSCULAR | Status: AC
  Filled 2019-04-07: qty 1

## 2019-04-07 MED ORDER — DOBUTAMINE IN D5W 4-5 MG/ML-% IV SOLN
INTRAVENOUS | Status: AC
  Filled 2019-04-07: qty 250

## 2019-04-07 MED ORDER — SODIUM CHLORIDE 0.9 % IV SOLN
INTRAVENOUS | Status: DC | PRN
  Administered 2019-04-07: 10:00:00 via INTRAVENOUS
  Administered 2019-04-07: 60 ug/min via INTRAVENOUS

## 2019-04-07 MED ORDER — BUPIVACAINE HCL (PF) 0.25 % IJ SOLN
INTRAMUSCULAR | Status: AC
  Filled 2019-04-07: qty 30

## 2019-04-07 MED ORDER — SODIUM CHLORIDE 0.9% FLUSH: 3.0000 mL | INTRAVENOUS | Status: DC | PRN

## 2019-04-07 MED ORDER — DEXAMETHASONE SODIUM PHOSPHATE 10 MG/ML IJ SOLN
INTRAMUSCULAR | Status: DC | PRN
  Administered 2019-04-07: 6 mg via INTRAVENOUS

## 2019-04-07 MED ORDER — SODIUM CHLORIDE 0.9% FLUSH: 3.0000 mL | Freq: Two times a day (BID) | INTRAVENOUS | Status: DC

## 2019-04-07 MED ORDER — LIDOCAINE 2% (20 MG/ML) 5 ML SYRINGE
INTRAMUSCULAR | Status: DC | PRN
  Administered 2019-04-07: 60 mg via INTRAVENOUS

## 2019-04-07 MED ORDER — HEPARIN SODIUM (PORCINE) 1000 UNIT/ML IJ SOLN
INTRAMUSCULAR | Status: DC | PRN
  Administered 2019-04-07: 1000 [IU] via INTRAVENOUS

## 2019-04-07 MED ORDER — ROCURONIUM BROMIDE 50 MG/5ML IV SOSY
PREFILLED_SYRINGE | INTRAVENOUS | Status: DC | PRN
  Administered 2019-04-07: 10 mg via INTRAVENOUS
  Administered 2019-04-07: 90 mg via INTRAVENOUS

## 2019-04-07 MED ORDER — FENTANYL CITRATE (PF) 100 MCG/2ML IJ SOLN
INTRAMUSCULAR | Status: DC | PRN
  Administered 2019-04-07 (×4): 50 ug via INTRAVENOUS

## 2019-04-07 MED ORDER — PROPOFOL 10 MG/ML IV BOLUS
INTRAVENOUS | Status: DC | PRN
  Administered 2019-04-07: 60 mg via INTRAVENOUS
  Administered 2019-04-07: 90 mg via INTRAVENOUS
  Administered 2019-04-07: 30 mg via INTRAVENOUS

## 2019-04-07 MED ORDER — SODIUM CHLORIDE 0.9 % IV SOLN
INTRAVENOUS | Status: DC
  Administered 2019-04-07 (×2): via INTRAVENOUS

## 2019-04-07 MED ORDER — ONDANSETRON HCL 4 MG/2ML IJ SOLN
INTRAMUSCULAR | Status: DC | PRN
  Administered 2019-04-07: 4 mg via INTRAVENOUS

## 2019-04-07 MED ORDER — HEPARIN (PORCINE) IN NACL 1000-0.9 UT/500ML-% IV SOLN
INTRAVENOUS | Status: DC | PRN
  Administered 2019-04-07 (×5): 500 mL

## 2019-04-07 MED ORDER — HEPARIN SODIUM (PORCINE) 1000 UNIT/ML IJ SOLN
INTRAMUSCULAR | Status: DC | PRN
  Administered 2019-04-07: 15000 [IU] via INTRAVENOUS
  Administered 2019-04-07: 2000 [IU] via INTRAVENOUS
  Administered 2019-04-07: 1000 [IU] via INTRAVENOUS
  Administered 2019-04-07: 4000 [IU] via INTRAVENOUS

## 2019-04-07 MED ORDER — PHENYLEPHRINE 40 MCG/ML (10ML) SYRINGE FOR IV PUSH (FOR BLOOD PRESSURE SUPPORT)
PREFILLED_SYRINGE | INTRAVENOUS | Status: DC | PRN
  Administered 2019-04-07 (×3): 80 ug via INTRAVENOUS

## 2019-04-07 MED ORDER — PROTAMINE SULFATE 10 MG/ML IV SOLN
INTRAVENOUS | Status: DC | PRN
  Administered 2019-04-07: 10 mg via INTRAVENOUS
  Administered 2019-04-07: 30 mg via INTRAVENOUS
  Administered 2019-04-07: 10 mg via INTRAVENOUS

## 2019-04-07 MED ORDER — ACETAMINOPHEN 325 MG PO TABS
650.0000 mg | ORAL_TABLET | ORAL | Status: DC | PRN
  Filled 2019-04-07: qty 2

## 2019-04-07 MED ORDER — DOBUTAMINE IN D5W 4-5 MG/ML-% IV SOLN
INTRAVENOUS | Status: DC | PRN
  Administered 2019-04-07: 20 ug/kg/min via INTRAVENOUS

## 2019-04-07 MED ORDER — ONDANSETRON HCL 4 MG/2ML IJ SOLN: 4.0000 mg | Freq: Four times a day (QID) | INTRAMUSCULAR | Status: DC | PRN

## 2019-04-07 MED ORDER — SUGAMMADEX SODIUM 200 MG/2ML IV SOLN
INTRAVENOUS | Status: DC | PRN
  Administered 2019-04-07: 350 mg via INTRAVENOUS

## 2019-04-07 SURGICAL SUPPLY — 23 items
BLANKET WARM UNDERBOD FULL ACC (MISCELLANEOUS) ×2 IMPLANT
CATH MAPPNG PENTARAY F 2-6-2MM (CATHETERS) IMPLANT
CATH SMTCH THERMOCOOL SF DF (CATHETERS) ×1 IMPLANT
CATH SOUNDSTAR 3D IMAGING (CATHETERS) ×1 IMPLANT
CATH WEBSTER BI DIR CS D-F CRV (CATHETERS) ×1 IMPLANT
COVER SWIFTLINK CONNECTOR (BAG) ×2 IMPLANT
DEVICE CLOSURE PERCLS PRGLD 6F (VASCULAR PRODUCTS) IMPLANT
HOVERMATT SINGLE USE (MISCELLANEOUS) ×1 IMPLANT
PACK EP LATEX FREE (CUSTOM PROCEDURE TRAY) ×2
PACK EP LF (CUSTOM PROCEDURE TRAY) ×1 IMPLANT
PAD PRO RADIOLUCENT 2001M-C (PAD) ×2 IMPLANT
PATCH CARTO3 (PAD) ×1 IMPLANT
PENTARAY F 2-6-2MM (CATHETERS) ×2
PERCLOSE PROGLIDE 6F (VASCULAR PRODUCTS) ×8
SHEATH AVANTI 11F 11CM (SHEATH) ×1 IMPLANT
SHEATH BAYLIS SUREFLEX  M 8.5 (SHEATH) ×1
SHEATH BAYLIS SUREFLEX M 8.5 (SHEATH) IMPLANT
SHEATH BAYLIS TRANSSEPTAL 98CM (NEEDLE) ×1 IMPLANT
SHEATH CARTO VIZIGO SM CVD (SHEATH) ×1 IMPLANT
SHEATH PINNACLE 7F 10CM (SHEATH) ×1 IMPLANT
SHEATH PINNACLE 8F 10CM (SHEATH) ×2 IMPLANT
SHEATH PROBE COVER 6X72 (BAG) ×1 IMPLANT
TUBING SMART ABLATE COOLFLOW (TUBING) ×1 IMPLANT

## 2019-04-07 NOTE — Transfer of Care (Signed)
Immediate Anesthesia Transfer of Care Note  Patient: Clinton Black  Procedure(s) Performed: ATRIAL FIBRILLATION ABLATION (N/A )  Patient Location: Cath Lab  Anesthesia Type:General  Level of Consciousness: awake and patient cooperative  Airway & Oxygen Therapy: Patient Spontanous Breathing and Patient connected to nasal cannula oxygen  Post-op Assessment: Report given to RN, Post -op Vital signs reviewed and stable and Patient moving all extremities X 4  Post vital signs: Reviewed and stable  Last Vitals:  Vitals Value Taken Time  BP    Temp    Pulse    Resp    SpO2      Last Pain:  Vitals:   04/07/19 0606  TempSrc: Oral  PainSc:       Patients Stated Pain Goal: 3 ([id number])  Complications: No apparent anesthesia complications

## 2019-04-07 NOTE — Discharge Instructions (Signed)
Post procedure care instructions No driving for 4 days. No lifting over 5 lbs for 1 week. No vigorous or sexual activity for 1 week. You may return to work on 04/14/2019. Keep procedure site clean & dry. If you notice increased pain, swelling, bleeding or pus, call/return!  You may shower, but no soaking baths/hot tubs/pools for 1 week.      You have an appointment set up with the [facility].  Multiple studies have shown that being followed by a dedicated atrial fibrillation clinic in addition to the standard care you receive from your other physicians improves health. We believe that enrollment in the atrial fibrillation clinic will allow [place] to better care for you.   The phone number to the [facility] is [phone]. The clinic is staffed Monday through Friday from 8:30am to 5pm.  Parking Directions: The clinic is located in the Heart and Vascular Building connected to [facility]. 1)From [address] turn on to [address] and go to the 3rd entrance  (Heart and Vascular entrance) on the right. 2)Look to the right for Heart &Vascular Parking Garage. 3)A code for the entrance is required please call the clinic to receive this.   4)Take the elevators to the 1st floor. Registration is in the room with the glass walls at the end of the hallway.  If you have any trouble parking or locating the clinic, please dont hesitate to call [phone].

## 2019-04-07 NOTE — Anesthesia Postprocedure Evaluation (Signed)
Anesthesia Post Note  Patient: Clinton Black  Procedure(s) Performed: ATRIAL FIBRILLATION ABLATION (N/A )     Patient location during evaluation: PACU Anesthesia Type: General Level of consciousness: awake and alert Pain management: pain level controlled Vital Signs Assessment: post-procedure vital signs reviewed and stable Respiratory status: spontaneous breathing, nonlabored ventilation, respiratory function stable and patient connected to nasal cannula oxygen Cardiovascular status: blood pressure returned to baseline and stable Postop Assessment: no apparent nausea or vomiting Anesthetic complications: no    Last Vitals:  Vitals:   04/07/19 1500 04/07/19 1600  BP: 117/63 (!) 127/59  Pulse: 73 79  Resp: 17 18  Temp:    SpO2: 95% 98%    Last Pain:  Vitals:   04/07/19 1316  TempSrc:   PainSc: 0-No pain                  

## 2019-04-07 NOTE — H&P (Signed)
Electrophysiology Office Note   Date:  04/07/2019   ID:  Clinton, Black 1945-08-01, MRN [id number]  PCP:  [name], MD  Cardiologist:  [name] Primary Electrophysiologist:  [name], MD    Chief Complaint: AF   History of Present Illness: Clinton Black is a 73 y.o. male who is being seen today for the evaluation of AF at the request of [name]. Presenting today for electrophysiology evaluation.  He has a history of thoracic aortic aneurysm, type 2 diabetes, morbid obesity, hypertension, hyperlipidemia, and atrial fibrillation.  He had a cardioversion on 07/12/2018.  He says that he felt like he went back out of rhythm 1 week later.  While he was in sinus rhythm he had improved energy.  Known with an attempted cardioversion on March 9 which was unsuccessful.  He is currently being evaluated for aortic surgery for a thoracic aortic aneurysm.  Today, denies symptoms of chest pain, orthopnea, PND, lower extremity edema, claudication, dizziness, presyncope, syncope, bleeding, or neurologic sequela. The patient is tolerating medications without difficulties. Plan for ablation today.   Past Medical History:  Diagnosis Date  . Arthritis   . BPH (benign prostatic hyperplasia)   . Diabetes mellitus type 2 in obese (HCC)    diet controlled  . HTN (hypertension)   . Hyperlipidemia, mixed   . Insomnia   . Obesity   . Thoracic aortic aneurysm, without rupture (HCC)    4.7 cm per chest ct with contrast 11-04-17   Past Surgical History:  Procedure Laterality Date  . CARDIOVERSION N/A 07/12/2018   Procedure: CARDIOVERSION;  Surgeon: Acie Fredrickson Wonda Cheng, MD;  [facility] ENDOSCOPY;  Service: Cardiovascular;  Laterality: N/A;  . CARDIOVERSION N/A 08/22/2018   Procedure: CARDIOVERSION;  Surgeon: Skeet Latch, MD;  Location: [place];  Service: Cardiovascular;  Laterality: N/A;  . COLONOSCOPY  2016  . CYST EXCISION     polynomial  . CYSTOSCOPY WITH INSERTION OF  UROLIFT N/A 04/18/2018   Procedure: CYSTOSCOPY WITH INSERTION OF UROLIFT;  Surgeon: Cleon Gustin, MD;  Location[facility];  Service: Urology;  Laterality: N/A;  . KNEE SURGERY Right 2000   arthroscopy  . RIGHT/LEFT HEART CATH AND CORONARY ANGIOGRAPHY N/A 01/30/2019   Procedure: RIGHT/LEFT HEART CATH AND CORONARY ANGIOGRAPHY;  Surgeon: [name], [name], MD;  Location: [facility] [facility];  Service: Cardiovascular;  Laterality: N/A;  . TONSILLECTOMY    . widson teeth extraction       Current Facility-Administered Medications  Medication Dose Route Frequency Provider Last Rate Last Dose  . 0.9 %  sodium chloride infusion   Intravenous Continuous [name], MD 50 mL/hr at 04/07/19 [id number]      Allergies:   Quinolones   Social History:  The patient  reports that he quit smoking about 24 years ago. His smoking use included cigarettes. He has a 7.50 pack-year smoking history. He has never used smokeless tobacco. He reports current alcohol use of about 14.0 standard drinks of alcohol per week. He reports current drug use. Drug: Marijuana.   Family History:  The patient's family history includes Aneurysm in his father; Arthritis in his mother; Heart disease in his father; Macular degeneration in his mother; Varicose Veins in his mother.   Vitals:   04/07/19 0606  BP: 114/68  Pulse: 74  Resp: 18  Temp: (!) 97.5 F (36.4 C)  SpO2: 99%   GEN: Well nourished, well developed, in no acute distress  HEENT:  normal  Neck: no JVD, carotid bruits, or masses Cardiac: iRRR; no murmurs, rubs, or gallops,no edema  Respiratory:  clear to auscultation bilaterally, normal work of breathing GI: soft, nontender, nondistended, + BS MS: no deformity or atrophy  Skin: warm and dry Neuro:  Strength and sensation are intact Psych: euthymic mood, full affect   Recent Labs: 06/01/2018: ALT 21 10/20/2018: TSH 5.35 03/30/2019: BUN 23; Creatinine, Ser 1.12; Hemoglobin 15.8;  Platelets 205; Potassium 4.9; Sodium 140    Lipid Panel     Component Value Date/Time   CHOL 110 06/01/2018 1328   CHOL 105 03/11/2017 1541   TRIG 137.0 06/01/2018 1328   HDL 34.30 (L) 06/01/2018 1328   HDL 36 (L) 03/11/2017 1541   CHOLHDL 3 06/01/2018 1328   VLDL 27.4 06/01/2018 1328   LDLCALC 48 06/01/2018 1328   LDLCALC 46 03/11/2017 1541   LDLDIRECT 120.0 05/11/2016 1433     Wt Readings from Last 3 Encounters:  04/07/19 133.8 kg  02/28/19 133.8 kg  02/21/19 134.8 kg      Other studies Reviewed: Additional studies/ records that were reviewed today include: TTE 12/28/18  Review of the above records today demonstrates:   1. The left ventricle has normal systolic function with an ejection fraction of 60-65%. The cavity size was normal. There is mildly increased left ventricular wall thickness. Left ventricular diastolic Doppler parameters are consistent with impaired  relaxation.  2. The right ventricle has normal systolic function. The cavity was normal. There is no increase in right ventricular wall thickness.  3. The aortic valve is tricuspid. Mild thickening of the aortic valve. Moderate calcification of the aortic valve. Aortic valve regurgitation is moderate by color flow Doppler.  4. There is dilatation of the ascending aorta measuring 49 mm.  LHC [id number]  LV end diastolic pressure is normal.  There is moderate (3+) aortic regurgitation.   1. Normal coronary anatomy. Left dominant circulation 2. Normal right heart pressures 3. Normal cardiac output.  4. Moderate aortic insufficiency. No significant stenosis 5. Ascending thoracic aortic aneurysm.   ASSESSMENT AND PLAN:  1.  Persistent atrial fibrillation: On Xarelto. Plan for ablation.  This patients CHA2DS2-VASc Score and unadjusted Ischemic Stroke Rate (% per year) is equal to 4.8 % stroke rate/year from a score of 4  Above score calculated as 1 point each if present [CHF, HTN, DM, Vascular=MI/PAD/Aortic  Plaque, Age if 65-74, or Male] Above score calculated as 2 points each if present [Age > 75, or Stroke/TIA/TE]  2.  Thoracic aortic aneurysm: Followed by cardiac surgery.  3.  Hypertension:well controlled  4.  Hyperlipidemia: Lipitor per primary cardiology   [name] has presented today for surgery, with the diagnosis of atrial fibrillation.  The various methods of treatment have been discussed with the patient and family. After consideration of risks, benefits and other options for treatment, the patient has consented to  Procedure(s): Catheter ablation as a surgical intervention .  Risks include but not limited to bleeding, tamponade, heart block, stroke, damage to surrounding organs, among others. The patient's history has been reviewed, patient examined, no change in status, stable for surgery.  I have reviewed the patient's chart and labs.  Questions were answered to the patient's satisfaction.     Curt Bears, MD 04/07/2019 7:12 AM

## 2019-04-07 NOTE — Anesthesia Preprocedure Evaluation (Signed)
Anesthesia Evaluation  Patient identified by MRN, date of birth, ID band Patient awake    Reviewed: Allergy & Precautions, NPO status , Patient's Chart, lab work & pertinent test results  History of Anesthesia Complications Negative for: history of anesthetic complications  Airway Mallampati: II  TM Distance: >3 FB Neck ROM: Full    Dental  (+) Dental Advisory Given, Teeth Intact   Pulmonary neg recent URI, former smoker,    breath sounds clear to auscultation       Cardiovascular hypertension, Pt. on medications + Peripheral Vascular Disease and +CHF  + Valvular Problems/Murmurs AI  Rhythm:Irregular     Neuro/Psych negative neurological ROS  negative psych ROS   GI/Hepatic negative GI ROS, Neg liver ROS,   Endo/Other  diabetes, Type 2Morbid obesity  Renal/GU negative Renal ROS     Musculoskeletal  (+) Arthritis ,   Abdominal   Peds  Hematology   Anesthesia Other Findings 1. The left ventricle has normal systolic function with an ejection fraction of 60-65%. The cavity size was normal. There is mildly increased left ventricular wall thickness. Left ventricular diastolic Doppler parameters are consistent with impaired  relaxation.  2. The right ventricle has normal systolic function. The cavity was normal. There is no increase in right ventricular wall thickness.  3. The aortic valve is tricuspid. Mild thickening of the aortic valve. Moderate calcification of the aortic valve. Aortic valve regurgitation is moderate by color flow Doppler.  4. There is dilatation of the ascending aorta measuring 49 mm  Reproductive/Obstetrics                             Anesthesia Physical Anesthesia Plan  ASA: III  Anesthesia Plan: General   Post-op Pain Management:    Induction: Intravenous  PONV Risk Score and Plan: 2 and Dexamethasone and Ondansetron  Airway Management Planned: Oral  ETT  Additional Equipment: None  Intra-op Plan:   Post-operative Plan: Extubation in OR  Informed Consent: I have reviewed the patients History and Physical, chart, labs and discussed the procedure including the risks, benefits and alternatives for the proposed anesthesia with the patient or authorized representative who has indicated his/her understanding and acceptance.     Dental advisory given  Plan Discussed with: CRNA and Surgeon  Anesthesia Plan Comments:         Anesthesia Quick Evaluation

## 2019-04-07 NOTE — Anesthesia Procedure Notes (Signed)
Procedure Name: Intubation Date/Time: 04/07/2019 7:46 AM Performed by: Orlie Dakin, CRNA Pre-anesthesia Checklist: Patient identified, Emergency Drugs available, Suction available and Patient being monitored Patient Re-evaluated:Patient Re-evaluated prior to induction Oxygen Delivery Method: Circle system utilized Preoxygenation: Pre-oxygenation with 100% oxygen Induction Type: IV induction Ventilation: Oral airway inserted - appropriate to patient size, Mask ventilation with difficulty and Two handed mask ventilation required Laryngoscope Size: Miller and 3 Grade View: Grade I Tube type: Oral Tube size: 7.5 mm Number of attempts: 1 Airway Equipment and Method: Stylet Placement Confirmation: ETT inserted through vocal cords under direct vision,  positive ETCO2 and breath sounds checked- equal and bilateral Secured at: 24 cm Tube secured with: Tape Dental Injury: Teeth and Oropharynx as per pre-operative assessment  Comments: 2-hand mask vent needed due to short thick neck, large jaw, beard, large tongue, large nose, oral airway was used.  Easier mask vent after NDMR administered.  4x4s bite block used.

## 2019-04-08 ENCOUNTER — Telehealth: Payer: Self-pay | Admitting: Medical

## 2019-04-08 NOTE — Telephone Encounter (Signed)
   Patient called the after hours line with complaints of chest pain and SOB today. He underwent Afib Ablation with Dr. Curt Bears on 04/07/2019 with successful return to NSR. He reports he felt well after the procedure, however today he has noticed substernal sharp chest pain exacerbated by deep breathing. He denies TTP of chest wall. Pain does not radiate and is not associated with dizziness, lightheadedness, diaphoresis, or syncope. He reports some splinting with breathing and sounds SOB over the phone. He reported taking ibuprofen which has diminished the pain over the course of our phone call. He denies missed doses of his xarelto making PE unlikely. He had a recent LHC which revealed normal coronary arteries making ACS unlikely. He is quite symptomatic with his atrial fibrillation and denies any palpitations; HR 80 bpm at the time of my call, making recurrent arrhythmia unlikely. Possible he has a little pleurisy after anesthesia from yesterday. Unclear etiology of symptoms but reassuring that pain improved with ibuprofen. I have tentatively scheduled him for a visit with Rosaria Ferries 04/10/2019 at 10:15am in the event his symptoms persist. Patient may call to cancel this appointment if symptoms resolve in the meantime. If symptoms worsen, he was instructed to present to the ED for evaluation. Patient was appreciative of the call and in agreement with the plan.  Abigail Butts, PA-C 04/08/19; 5:37 PM

## 2019-04-10 ENCOUNTER — Ambulatory Visit: Payer: Medicare HMO | Admitting: Physician Assistant

## 2019-04-10 ENCOUNTER — Encounter (HOSPITAL_COMMUNITY): Payer: Self-pay | Admitting: Cardiology

## 2019-04-10 LAB — POCT ACTIVATED CLOTTING TIME: Activated Clotting Time: 307 seconds

## 2019-04-12 ENCOUNTER — Other Ambulatory Visit: Payer: Self-pay | Admitting: Internal Medicine

## 2019-04-12 ENCOUNTER — Other Ambulatory Visit: Payer: Self-pay | Admitting: Physician Assistant

## 2019-04-12 DIAGNOSIS — G4730 Sleep apnea, unspecified: Secondary | ICD-10-CM

## 2019-04-12 DIAGNOSIS — G4700 Insomnia, unspecified: Secondary | ICD-10-CM

## 2019-04-13 NOTE — Progress Notes (Signed)
Cardiology Office Note    Date:  04/17/2019   ID:  Clinton Black, Clinton Black 10-Dec-1945, MRN [id number]  PCP:  [name], MD  Cardiologist: Dr. [name]  Chief Complaint  Patient presents with  . Atrial Fibrillation    History of Present Illness:  Clinton Black is a 73 y.o. male with PMH of DM II, HTN, HLD, TAA, morbid obesity, and persistent A. fib.  Myoview obtained on 02/12/2017 showed EF 39%, no ischemia, intermediate risk study due to reduced systolic function.  Echocardiogram obtained showed normal EF 55%.  Previous echocardiogram in October 2019 showed EF 50 to 55%, grade 1 DD, dilated thoracic aorta measuring 5 cm, moderate AI.  He was diagnosed with atrial fibrillation in December 2019 and was started on Xarelto.  He underwent successful DC cardioversion on 07/12/2018.  He felt better after the cardioversion however quickly went back into A. fib.  He was seen in the A. fib clinic and was loaded with amiodarone therapy.  Repeat attempt at DC cardioversion in March was unsuccessful.  Amiodarone was subsequently discontinued.  Options include Tikosyn and amiodarone were discussed.  He was seen back in the ED near the end of March was facial droop secondary to Bell's palsy.  MRI was negative for CVA.  Cardiac catheterization performed 03/02/2019 showed normal coronary anatomy, moderate aortic regurgitation. He was seen by Dr. Curt Bears in September. He did undergo successful Afib ablation on 04/07/19 by Dr Curt Bears. He notes that he immediately felt better after the procedure and breathing was improved. The next day he noted some pressure in his chest and felt he couldn't get a deep breath. He took an Advil and it resolved. He has felt like the afib is trying to start but hasn't returned yet.    Past Medical History:  Diagnosis Date  . Arthritis   . BPH (benign prostatic hyperplasia)   . Diabetes mellitus type 2 in obese (HCC)    diet controlled  . HTN (hypertension)   . Hyperlipidemia,  mixed   . Insomnia   . Obesity   . Thoracic aortic aneurysm, without rupture (HCC)    4.7 cm per chest ct with contrast 11-04-17    Past Surgical History:  Procedure Laterality Date  . ATRIAL FIBRILLATION ABLATION N/A 04/07/2019   Procedure: ATRIAL FIBRILLATION ABLATION;  Surgeon: [name], MD;  Location: [facility];  Service: Cardiovascular;  Laterality: N/A;  . CARDIOVERSION N/A 07/12/2018   Procedure: CARDIOVERSION;  Surgeon: Thayer Headings, MD;  Location: [facility] END[facility]COPY;  Service: Cardiovascular;  Laterality: N/A;  . CARDIOVERSION N/A 08/22/2018   Procedure: CARDIOVERSION;  Surgeon: Skeet Latch, MD;  Location: [place];  Service: Cardiovascular;  Laterality: N/A;  . COLONOSCOPY  2016  . CYST EXCISION     polynomial  . CYSTOSCOPY WITH INSERTION OF UROLIFT N/A 04/18/2018   Procedure: CYSTOSCOPY WITH INSERTION OF UROLIFT;  Surgeon: Cleon Gustin, MD;  Location: [facility];  Service: Urology;  Laterality: N/A;  . KNEE SURGERY Right 2000   arthroscopy  . RIGHT/LEFT HEART CATH AND CORONARY ANGIOGRAPHY N/A 01/30/2019   Procedure: RIGHT/LEFT HEART CATH AND CORONARY ANGIOGRAPHY;  Surgeon: [name],  M, MD;  Location: [facility];  Service: Cardiovascular;  Laterality: N/A;  . TONSILLECTOMY    . widson teeth extraction      Current Medications: Outpatient Medications Prior to Visit  Medication Sig Dispense Refill  . APPLE CIDER VINEGAR PO Take 2 capsules by mouth  at bedtime.    Marland Kitchen atorvastatin (LIPITOR) 20 MG tablet Take 1 tablet (20 mg total) by mouth at bedtime. 90 tablet 0  . Empagliflozin-metFORMIN HCl 5-500 MG TABS Take 1 tablet by mouth daily. (Patient taking differently: Take 1 tablet by mouth every evening. ) 180 tablet 0  . GARLIC PO Take 2 tablets by mouth every evening.     . hydrocortisone cream 1 % Apply 1 application topically daily as needed (rash).    Marland Kitchen losartan (COZAAR) 25 MG tablet TAKE 2 TABLETS BY MOUTH EVERY DAY  (Patient taking differently: Take 50 mg by mouth every evening. ) 180 tablet 1  . Menthol-Methyl Salicylate (SALONPAS PAIN RELIEF PATCH EX) Apply 1 patch topically daily as needed (back pain).    . metoprolol succinate (TOPROL-XL) 25 MG 24 hr tablet TAKE 1 TABLET BY MOUTH EVERY DAY (Patient taking differently: Take 25 mg by mouth every evening. ) 90 tablet 2  . rivaroxaban (XARELTO) 20 MG TABS tablet Take 1 tablet (20 mg total) by mouth daily with supper.    Marland Kitchen spironolactone (ALDACTONE) 25 MG tablet TAKE 0.5 TABLETS (12.5 MG TOTAL) BY MOUTH AT BEDTIME. 45 tablet 0  . Tetrahydrozoline HCl (VISINE OP) Place 1 drop into both eyes daily as needed (dry eyes).    . triazolam (HALCION) 0.25 MG tablet Take 1 tablet (0.25 mg total) by mouth at bedtime as needed for sleep. 30 tablet 0  . TURMERIC PO Take 15 mLs by mouth at bedtime.      Facility-Administered Medications Prior to Visit  Medication Dose Route Frequency Provider Last Rate Last Dose  . sodium chloride flush (NS) 0.9 % injection 3 mL  3 mL Intravenous Q12H [name],  M, MD         Allergies:   Quinolones   Social History   Socioeconomic History  . Marital status: Single    Spouse name: Not on file  . Number of children: Not on file  . Years of education: Not on file  . Highest education level: Not on file  Occupational History  . Not on file  Social Needs  . Financial resource strain: Not on file  . Food insecurity    Worry: Not on file    Inability: Not on file  . Transportation needs    Medical: Not on file    Non-medical: Not on file  Tobacco Use  . Smoking status: Former Smoker    Packs/day: 1.50    Years: 5.00    Pack years: 7.50    Types: Cigarettes    Quit date: 06/15/1994    Years since quitting: 24.8  . Smokeless tobacco: Never Used  Substance and Sexual Activity  . Alcohol use: Yes    Alcohol/week: 14.0 standard drinks    Types: 14 Shots of liquor per week    Comment: 1 drink of liquor per day  . Drug use:  Yes    Types: Marijuana    Comment: occasionally marijuana  . Sexual activity: Yes    Partners: Female    Birth control/protection: Condom    Comment: condom use most of the time but not always  Lifestyle  . Physical activity    Days per week: Not on file    Minutes per session: Not on file  . Stress: Not on file  Relationships  . Social Herbalist on phone: Not on file    Gets together: Not on file    Attends religious service: Not  on file    Active member of club or organization: Not on file    Attends meetings of clubs or organizations: Not on file    Relationship status: Not on file  Other Topics Concern  . Not on file  [place] [employer] and [employer]. married '72- 3 years/divorced; married '89- 3 years/divorced;. married '03 - 3 years/divorced. 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     Family History:  The patient's family history includes Aneurysm in his father; Arthritis in his mother; Heart disease in his father; Macular degeneration in his mother; Varicose Veins in his mother.   ROS:   Please see the history of present illness.    ROS All other systems reviewed and are negative.   PHYSICAL EXAM:   VS:  BP 124/62   Pulse 61   Ht 6\' 5"  (1.956 m)   Wt 293 lb 6.4 oz (133.1 kg)   SpO2 97%   BMI 34.79 kg/m    GEN: Well nourished, well developed, in no acute distress  HEENT: normal  Neck: no JVD, carotid bruits, or masses Cardiac: Irregularly irregular; no murmurs, rubs, or gallops,no edema  Respiratory:  clear to auscultation bilaterally, normal work of breathing GI: soft, nontender, nondistended, + BS MS: no deformity or atrophy  Skin: warm and dry, no rash Neuro:  Alert and Oriented x 3, Strength and sensation are intact Psych: euthymic mood, full affect  Wt Readings from Last 3 Encounters:  04/17/19 293 lb 6.4 oz (133.1 kg)  04/07/19 295 lb (133.8 kg)  02/28/19 295 lb (133.8 kg)      Studies/Labs Reviewed:   EKG:  EKG  is not ordered today.   Recent Labs: 06/01/2018: ALT 21 10/20/2018: TSH 5.35 03/30/2019: BUN 23; Creatinine, Ser 1.12; Hemoglobin 15.8; Platelets 205; Potassium 4.9; Sodium 140   Lipid Panel    Component Value Date/Time   CHOL 110 06/01/2018 1328   CHOL 105 03/11/2017 1541   TRIG 137.0 06/01/2018 1328   HDL 34.30 (L) 06/01/2018 1328   HDL 36 (L) 03/11/2017 1541   CHOLHDL 3 06/01/2018 1328   VLDL 27.4 06/01/2018 1328   LDLCALC 48 06/01/2018 1328   LDLCALC 46 03/11/2017 1541   LDLDIRECT 120.0 05/11/2016 1433    Additional studies/ records that were reviewed today include:   Echo 03/29/2018 LV EF: 50% -   55% Study Conclusions  - Left ventricle: The cavity size was normal. There was moderate   basal hypertrophy of the septum with otherwise mild concentric   hypertrophy. Systolic function was normal. The estimated ejection   fraction was in the range of 50% to 55%. Wall motion was normal;   there were no regional wall motion abnormalities. Doppler   parameters are consistent with abnormal left ventricular   relaxation (grade 1 diastolic dysfunction). - Aortic valve: Transvalvular velocity was within the normal range.   There was no stenosis. There was moderate regurgitation. - Aorta: Ascending aortic diameter: 50 mm (S). 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 - Mitral valve: Transvalvular velocity was within the normal range.   There was no evidence for stenosis. There was trivial   regurgitation. - Left atrium: The atrium was moderately dilated. - Right ventricle: The cavity size was normal. Wall thickness was   normal. Systolic function was normal. - Pulmonary arteries: Systolic pressure was within the normal   range. PA peak pressure: 24 mm Hg (S).  Impressions:  - Compared with the  echo 05/2017, the ascending aorta aneurysm has   increased from 4.7 cm to 5.0 cm.    Cath [id number]  LV end diastolic pressure is normal.  There is moderate  (3+) aortic regurgitation.   1. Normal coronary anatomy. Left dominant circulation 2. Normal right heart pressures 3. Normal cardiac output.  4. Moderate aortic insufficiency. No significant stenosis 5. Ascending thoracic aortic aneurysm.   Plan: will review with Dr Servando Snare. If Aortic surgery is not planned in the near future would recommend referral to EP for consideration of ablation of Afib.    ASSESSMENT:    1. Persistent atrial fibrillation ([place])   2. Essential hypertension   3. Morbid obesity ([place])   4. Thoracic aortic aneurysm without rupture ([place])   5. Chronic diastolic CHF (congestive heart failure), NYHA class 2 (HCC)      PLAN:  In order of problems listed above:  1. Persistent atrial fibrillation: symptomatic. Failed DCCV on amiodarone. S/p Afib ablation on 04/07/19. Immediate improvement in symptoms. Rhythm is regular today. Follow up in AFib clinic.   2. Thoracic aortic aneurysm: Moderate aortic regurgitation seen on previous echocardiogram.  Stable and followed closely with Dr Servando Snare.   3. Hypertension: Blood pressure stable  4. Hyperlipidemia: Continue Lipitor.  5. DM2: Managed by primary care provider  6. Morbid obesity: Weight loss is imperative.    Medication Adjustments/Labs and Tests Ordered: Current medicines are reviewed at length with the patient today.  Concerns regarding medicines are outlined above.  Medication changes, Labs and Tests ordered today are listed in the Patient Instructions below. There are no Patient Instructions on file for this visit.   Signed,  [name], MD  04/17/2019 11:57 AM    [facility], [place], [place]  [address] Phone: [phone]; Fax: [phone]

## 2019-04-17 ENCOUNTER — Encounter: Payer: Self-pay | Admitting: Cardiology

## 2019-04-17 ENCOUNTER — Other Ambulatory Visit: Payer: Self-pay

## 2019-04-17 ENCOUNTER — Ambulatory Visit: Payer: Medicare HMO | Admitting: Cardiology

## 2019-04-17 VITALS — BP 124/62 | HR 61 | Ht 77.0 in | Wt 293.4 lb

## 2019-04-17 DIAGNOSIS — I5032 Chronic diastolic (congestive) heart failure: Secondary | ICD-10-CM

## 2019-04-17 DIAGNOSIS — I712 Thoracic aortic aneurysm, without rupture, unspecified: Secondary | ICD-10-CM

## 2019-04-17 DIAGNOSIS — I10 Essential (primary) hypertension: Secondary | ICD-10-CM

## 2019-04-17 DIAGNOSIS — I4819 Other persistent atrial fibrillation: Secondary | ICD-10-CM

## 2019-04-27 ENCOUNTER — Other Ambulatory Visit: Payer: Self-pay | Admitting: Internal Medicine

## 2019-04-27 DIAGNOSIS — E118 Type 2 diabetes mellitus with unspecified complications: Secondary | ICD-10-CM

## 2019-04-27 DIAGNOSIS — E785 Hyperlipidemia, unspecified: Secondary | ICD-10-CM

## 2019-05-05 ENCOUNTER — Encounter (HOSPITAL_COMMUNITY): Payer: Self-pay | Admitting: Nurse Practitioner

## 2019-05-05 ENCOUNTER — Ambulatory Visit (HOSPITAL_COMMUNITY)
Admission: RE | Admit: 2019-05-05 | Discharge: 2019-05-05 | Disposition: A | Discharge status: Home / Self Care | Payer: Medicare HMO | Source: Ambulatory Visit | Attending: Nurse Practitioner | Admitting: Nurse Practitioner

## 2019-05-05 ENCOUNTER — Other Ambulatory Visit: Payer: Self-pay

## 2019-05-05 VITALS — BP 118/60 | HR 93 | Ht 77.0 in | Wt 295.4 lb

## 2019-05-05 DIAGNOSIS — I4819 Other persistent atrial fibrillation: Secondary | ICD-10-CM | POA: Diagnosis not present

## 2019-05-05 DIAGNOSIS — Z7984 Long term (current) use of oral hypoglycemic drugs: Secondary | ICD-10-CM | POA: Diagnosis not present

## 2019-05-05 DIAGNOSIS — I712 Thoracic aortic aneurysm, without rupture: Secondary | ICD-10-CM | POA: Insufficient documentation

## 2019-05-05 DIAGNOSIS — Z79899 Other long term (current) drug therapy: Secondary | ICD-10-CM | POA: Insufficient documentation

## 2019-05-05 DIAGNOSIS — Z9889 Other specified postprocedural states: Secondary | ICD-10-CM | POA: Insufficient documentation

## 2019-05-05 DIAGNOSIS — Z6835 Body mass index (BMI) 35.0-35.9, adult: Secondary | ICD-10-CM | POA: Diagnosis not present

## 2019-05-05 DIAGNOSIS — N400 Enlarged prostate without lower urinary tract symptoms: Secondary | ICD-10-CM | POA: Diagnosis not present

## 2019-05-05 DIAGNOSIS — E782 Mixed hyperlipidemia: Secondary | ICD-10-CM | POA: Diagnosis not present

## 2019-05-05 DIAGNOSIS — E669 Obesity, unspecified: Secondary | ICD-10-CM | POA: Diagnosis not present

## 2019-05-05 DIAGNOSIS — Z8249 Family history of ischemic heart disease and other diseases of the circulatory system: Secondary | ICD-10-CM | POA: Insufficient documentation

## 2019-05-05 DIAGNOSIS — D6869 Other thrombophilia: Secondary | ICD-10-CM

## 2019-05-05 DIAGNOSIS — G4700 Insomnia, unspecified: Secondary | ICD-10-CM | POA: Insufficient documentation

## 2019-05-05 DIAGNOSIS — Z888 Allergy status to other drugs, medicaments and biological substances status: Secondary | ICD-10-CM | POA: Insufficient documentation

## 2019-05-05 DIAGNOSIS — Z7901 Long term (current) use of anticoagulants: Secondary | ICD-10-CM | POA: Insufficient documentation

## 2019-05-05 DIAGNOSIS — I10 Essential (primary) hypertension: Secondary | ICD-10-CM | POA: Insufficient documentation

## 2019-05-05 DIAGNOSIS — Z87891 Personal history of nicotine dependence: Secondary | ICD-10-CM | POA: Insufficient documentation

## 2019-05-05 DIAGNOSIS — E119 Type 2 diabetes mellitus without complications: Secondary | ICD-10-CM | POA: Diagnosis not present

## 2019-05-05 NOTE — Progress Notes (Signed)
Primary Care Physician: [name], MD Referring Physician: Dr. Curt Bears  Cardiologist: Dr. [name]    Clinton Black is a 73 y.o. male with a h/o of DM II, HTN, HLD, TAA, morbid obesity, and persistent A. Fib, EF of 39%,  DX with afib December 2019 and started on xarelto.CHA2DS2VASc of at least 4. DCCV 07/12/18 with ERAF. He was loaded on amiodarone with second DCCV unsuccessful. He recently had ablation one month ago  with Dr. Curt Bears and is back in clinic for f/u. He is in afib today. He feels it may be paroxysmal but he does not have a good way  to track it.  He feels his afib is more tolerable since ablation, but will feel it  several times a day. Marland Kitchen No swallowing or groin issues.    Today, he denies symptoms of palpitations, chest pain, shortness of breath, orthopnea, PND, lower extremity edema, dizziness, presyncope, syncope, or neurologic sequela. The patient is tolerating medications without difficulties and is otherwise without complaint today.   Past Medical History:  Diagnosis Date  . Arthritis   . BPH (benign prostatic hyperplasia)   . Diabetes mellitus type 2 in obese (HCC)    diet controlled  . HTN (hypertension)   . Hyperlipidemia, mixed   . Insomnia   . Obesity   . Thoracic aortic aneurysm, without rupture (HCC)    4.7 cm per chest ct with contrast 11-04-17   Past Surgical History:  Procedure Laterality Date  . ATRIAL FIBRILLATION ABLATION N/A 04/07/2019   Procedure: ATRIAL FIBRILLATION ABLATION;  Surgeon: [name], MD;  Location: [facility];  Service: Cardiovascular;  Laterality: N/A;  . CARDIOVERSION N/A 07/12/2018   Procedure: CARDIOVERSION;  Surgeon: Thayer Headings, MD;  Location: [facility] ENDOSCOPY;  Service: Cardiovascular;  Laterality: N/A;  . CARDIOVERSION N/A 08/22/2018   Procedure: CARDIOVERSION;  Surgeon: Skeet Latch, MD;  Location: [place];  Service: Cardiovascular;  Laterality: N/A;  . COLONOSCOPY  2016  . CYST EXCISION     polynomial  . CYSTOSCOPY WITH INSERTION OF UROLIFT N/A 04/18/2018   Procedure: CYSTOSCOPY WITH INSERTION OF UROLIFT;  Surgeon: Cleon Gustin, MD;  Location: [facility];  Service: Urology;  Laterality: N/A;  . KNEE SURGERY Right 2000   arthroscopy  . RIGHT/LEFT HEART CATH AND CORONARY ANGIOGRAPHY N/A 01/30/2019   Procedure: RIGHT/LEFT HEART CATH AND CORONARY ANGIOGRAPHY;  Surgeon: [name], [name], MD;  Location: [facility];  Service: Cardiovascular;  Laterality: N/A;  . TONSILLECTOMY    . widson teeth extraction      Current Outpatient Medications  Medication Sig Dispense Refill  . APPLE CIDER VINEGAR PO Take 2 capsules by mouth at bedtime.    Marland Kitchen atorvastatin (LIPITOR) 20 MG tablet Take 1 tablet (20 mg total) by mouth at bedtime. 90 tablet 0  . Empagliflozin-metFORMIN HCl 5-500 MG TABS Take 1 tablet by mouth daily. (Patient taking differently: Take 1 tablet by mouth every evening. ) 180 tablet 0  . GARLIC PO Take 2 tablets by mouth every evening.     . hydrocortisone cream 1 % Apply 1 application topically daily as needed (rash).    Marland Kitchen losartan (COZAAR) 25 MG tablet TAKE 2 TABLETS BY MOUTH EVERY DAY (Patient taking differently: Take 50 mg by mouth every evening. ) 180 tablet 1  . Menthol-Methyl Salicylate (SALONPAS PAIN RELIEF PATCH EX) Apply 1 patch topically daily as needed (back pain).    . metoprolol succinate (TOPROL-XL) 25 MG  24 hr tablet TAKE 1 TABLET BY MOUTH EVERY DAY (Patient taking differently: Take 25 mg by mouth every evening. ) 90 tablet 2  . rivaroxaban (XARELTO) 20 MG TABS tablet Take 1 tablet (20 mg total) by mouth daily with supper.    Marland Kitchen spironolactone (ALDACTONE) 25 MG tablet TAKE 0.5 TABLETS (12.5 MG TOTAL) BY MOUTH AT BEDTIME. 45 tablet 0  . Tetrahydrozoline HCl (VISINE OP) Place 1 drop into both eyes daily as needed (dry eyes).    . triazolam (HALCION) 0.25 MG tablet Take 1 tablet (0.25 mg total) by mouth at bedtime as needed for sleep. 30 tablet  0  . TURMERIC PO Take 15 mLs by mouth at bedtime.      Current Facility-Administered Medications  Medication Dose Route Frequency Provider Last Rate Last Dose  . sodium chloride flush (NS) 0.9 % injection 3 mL  3 mL Intravenous Q12H [name], [name], MD        Allergies  Allergen Reactions  . Quinolones Other (See Comments)    Patient was warned about not using Cipro and similar antibiotics. Recent studies have raised concern that fluoroquinolone antibiotics could be associated with an increased risk of aortic aneurysm Fluoroquinolones have non-antimicrobial properties that might jeopardise the integrity of the extracellular matrix of the vascular wall In a  propensity score matched cohort study in [place], there was a 66% increased rate of aortic aneurysm or dissection associated with oral fluoroquinolone use, compared wit    Social History   Socioeconomic History  . Marital status: Single    Spouse name: Not on file  . Number of children: Not on file  . Years of education: Not on file  . Highest education level: Not on file  Occupational History  . Not on file  Social Needs  . Financial resource strain: Not on file  . Food insecurity    Worry: Not on file    Inability: Not on file  . Transportation needs    Medical: Not on file    Non-medical: Not on file  Tobacco Use  . Smoking status: Former Smoker    Packs/day: 1.50    Years: 5.00    Pack years: 7.50    Types: Cigarettes    Quit date: 06/15/1994    Years since quitting: 24.9  . Smokeless tobacco: Never Used  Substance and Sexual Activity  . Alcohol use: Yes    Alcohol/week: 14.0 standard drinks    Types: 14 Shots of liquor per week    Comment: 1 drink of liquor per day  . Drug use: Yes    Types: Marijuana    Comment: occasionally marijuana  . Sexual activity: Yes    Partners: Female    Birth control/protection: Condom    Comment: condom use most of the time but not always  Lifestyle  . Physical activity     Days per week: Not on file    Minutes per session: Not on file  . Stress: Not on file  Relationships  . Social Herbalist on phone: Not on file    Gets together: Not on file    Attends religious service: Not on file    Active member of club or organization: Not on file    Attends meetings of clubs or organizations: Not on file    Relationship status: Not on file  . Intimate partner violence    Fear of current or ex partner: Not on file    Emotionally abused:  Not on file    Physically abused: Not on file    Forced sexual activity: Not on file  Other Topics Concern  . Not on file  [place] [employer] and [employer]. married '72- 3 years/divorced; married '89- 3 years/divorced;. married '03 - 3 years/divorced. 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    Family History  Problem Relation Age of Onset  . Aneurysm Father   . Heart disease Father   . Varicose Veins Mother   . Arthritis Mother   . Macular degeneration Mother   . Cancer Neg Hx   . Colon cancer Neg Hx   . Esophageal cancer Neg Hx   . Rectal cancer Neg Hx   . Stomach cancer Neg Hx     ROS- All systems are reviewed and negative except as per the HPI above  Physical Exam: Vitals:   05/05/19 1107  BP: 118/60  Pulse: 93  Weight: 134 kg  Height: 6\' 5"  (1.956 m)   Wt Readings from Last 3 Encounters:  05/05/19 134 kg  04/17/19 133.1 kg  04/07/19 133.8 kg    Labs: Lab Results  Component Value Date   NA 140 03/30/2019   K 4.9 03/30/2019   CL 106 03/30/2019   CO2 19 (L) 03/30/2019   GLUCOSE 134 (H) 03/30/2019   BUN 23 03/30/2019   CREATININE 1.12 03/30/2019   CALCIUM 9.6 03/30/2019   No results found for: INR Lab Results  Component Value Date   CHOL 110 06/01/2018   HDL 34.30 (L) 06/01/2018   LDLCALC 48 06/01/2018   TRIG 137.0 06/01/2018     GEN- The patient is well appearing, alert and oriented x 3 today.   Head- normocephalic, atraumatic Eyes-  Sclera clear,  conjunctiva pink Ears- hearing intact Oropharynx- clear Neck- supple, no JVP Lymph- no cervical lymphadenopathy Lungs- Clear to ausculation bilaterally, normal work of breathing Heart- irregular rate and rhythm, no murmurs, rubs or gallops, PMI not laterally displaced GI- soft, NT, ND, + BS Extremities- no clubbing, cyanosis, or edema MS- no significant deformity or atrophy Skin- no rash or lesion Psych- euthymic mood, full affect Neuro- strength and sensation are intact  EKG- afib at 93 bpm, qrs int 98 ms, qtc 435 ms    Assessment and Plan: 1. Persistent  afib Ablation one month ago States he feels improved  In afib today, pt unsure if it is paroxysmal or persistent  Does not have a means of tracking and does not have a smart phone Will place a 5 day zio patch to confirm If persistent he may need AAD/DCCV  2. CHA2DS2VASc of at least 4 Do not interrupt xarelto 20 mg daily during the recovery 3 month period  3. HTN Stable   Will be back in touch when I review monitor results   Geroge Baseman. , [facility] [address] [place], [place] [address] [phone]

## 2019-05-14 ENCOUNTER — Other Ambulatory Visit: Payer: Self-pay | Admitting: Internal Medicine

## 2019-05-14 DIAGNOSIS — E785 Hyperlipidemia, unspecified: Secondary | ICD-10-CM

## 2019-05-14 DIAGNOSIS — G4700 Insomnia, unspecified: Secondary | ICD-10-CM

## 2019-05-14 DIAGNOSIS — E118 Type 2 diabetes mellitus with unspecified complications: Secondary | ICD-10-CM

## 2019-05-14 DIAGNOSIS — G4730 Sleep apnea, unspecified: Secondary | ICD-10-CM

## 2019-05-18 ENCOUNTER — Other Ambulatory Visit: Payer: Self-pay | Admitting: Internal Medicine

## 2019-05-18 DIAGNOSIS — E118 Type 2 diabetes mellitus with unspecified complications: Secondary | ICD-10-CM

## 2019-05-18 DIAGNOSIS — E785 Hyperlipidemia, unspecified: Secondary | ICD-10-CM

## 2019-05-18 DIAGNOSIS — G4730 Sleep apnea, unspecified: Secondary | ICD-10-CM

## 2019-05-18 DIAGNOSIS — G4700 Insomnia, unspecified: Secondary | ICD-10-CM

## 2019-05-18 NOTE — Telephone Encounter (Signed)
Please review refill for atorvastatin 20 mg and and controlled medication halcion 0.25 mg. NOV 05/29/2019.

## 2019-05-18 NOTE — Telephone Encounter (Signed)
Copied from [place] [id number]. Topic: Quick Communication - Rx Refill/Question >> May 18, 2019  4:52 PM Izola Price, [place] A wrote: Medication: atorvastatin (LIPITOR) 20 MG tablet,triazolam (HALCION) 0.25 MG tablet (Patient would like medication sent to pharmacy as soon as possible. Pt scheduled appt for 05/29/2019) , Has the patient contacted their pharmacy? Yes (Agent: If no, request that the patient contact the pharmacy for the refill.) (Agent: If yes, when and what did the pharmacy advise?)Contact PCP  Preferred Pharmacy (with phone number or street name):[id number] - [place], [place] - [phone] (Phone) [phone] (Fax    Agent: Please be advised that RX refills may take up to 3 business days. We ask that you follow-up with your pharmacy.

## 2019-05-19 DIAGNOSIS — I4819 Other persistent atrial fibrillation: Secondary | ICD-10-CM | POA: Diagnosis not present

## 2019-05-22 MED ORDER — ATORVASTATIN CALCIUM 20 MG PO TABS: 20.0000 mg | ORAL_TABLET | Freq: Every day | ORAL | 0 refills | Status: DC

## 2019-05-22 MED ORDER — TRIAZOLAM 0.25 MG PO TABS: 0.2500 mg | ORAL_TABLET | Freq: Every evening | ORAL | 0 refills | Status: DC | PRN

## 2019-05-23 ENCOUNTER — Telehealth (HOSPITAL_COMMUNITY): Payer: Self-pay | Admitting: *Deleted

## 2019-05-23 NOTE — Telephone Encounter (Signed)
Pt notified of recommendations. Will come in next week.

## 2019-05-23 NOTE — Telephone Encounter (Signed)
-----   Message from Sherran Needs, NP sent at 05/23/2019 10:58 AM EST ----- Please ask pt to come back in to discuss AAD's per Dr. Curt Bears

## 2019-05-29 ENCOUNTER — Other Ambulatory Visit (INDEPENDENT_AMBULATORY_CARE_PROVIDER_SITE_OTHER): Payer: Medicare HMO

## 2019-05-29 ENCOUNTER — Ambulatory Visit (HOSPITAL_COMMUNITY)
Admission: RE | Admit: 2019-05-29 | Discharge: 2019-05-29 | Disposition: A | Discharge status: Home / Self Care | Payer: Medicare HMO | Source: Ambulatory Visit | Attending: Nurse Practitioner | Admitting: Nurse Practitioner

## 2019-05-29 ENCOUNTER — Encounter: Payer: Self-pay | Admitting: Internal Medicine

## 2019-05-29 ENCOUNTER — Ambulatory Visit (INDEPENDENT_AMBULATORY_CARE_PROVIDER_SITE_OTHER)
Admission: RE | Admit: 2019-05-29 | Discharge: 2019-05-29 | Disposition: A | Discharge status: Home / Self Care | Payer: Medicare HMO | Source: Ambulatory Visit | Attending: Internal Medicine | Admitting: Internal Medicine

## 2019-05-29 ENCOUNTER — Ambulatory Visit (INDEPENDENT_AMBULATORY_CARE_PROVIDER_SITE_OTHER): Payer: Medicare HMO | Admitting: Internal Medicine

## 2019-05-29 ENCOUNTER — Encounter (HOSPITAL_COMMUNITY): Payer: Self-pay | Admitting: Nurse Practitioner

## 2019-05-29 ENCOUNTER — Other Ambulatory Visit: Payer: Self-pay

## 2019-05-29 VITALS — BP 114/58 | HR 75 | Temp 98.0°F | Ht 77.0 in | Wt 295.0 lb

## 2019-05-29 VITALS — BP 114/66 | HR 95 | Ht 77.0 in | Wt 295.6 lb

## 2019-05-29 DIAGNOSIS — E118 Type 2 diabetes mellitus with unspecified complications: Secondary | ICD-10-CM

## 2019-05-29 DIAGNOSIS — E119 Type 2 diabetes mellitus without complications: Secondary | ICD-10-CM | POA: Diagnosis not present

## 2019-05-29 DIAGNOSIS — E785 Hyperlipidemia, unspecified: Secondary | ICD-10-CM | POA: Diagnosis not present

## 2019-05-29 DIAGNOSIS — I4891 Unspecified atrial fibrillation: Secondary | ICD-10-CM | POA: Diagnosis not present

## 2019-05-29 DIAGNOSIS — D6869 Other thrombophilia: Secondary | ICD-10-CM

## 2019-05-29 DIAGNOSIS — G4700 Insomnia, unspecified: Secondary | ICD-10-CM | POA: Diagnosis not present

## 2019-05-29 DIAGNOSIS — I4819 Other persistent atrial fibrillation: Secondary | ICD-10-CM | POA: Diagnosis not present

## 2019-05-29 DIAGNOSIS — I5032 Chronic diastolic (congestive) heart failure: Secondary | ICD-10-CM

## 2019-05-29 DIAGNOSIS — I10 Essential (primary) hypertension: Secondary | ICD-10-CM | POA: Diagnosis not present

## 2019-05-29 DIAGNOSIS — N400 Enlarged prostate without lower urinary tract symptoms: Secondary | ICD-10-CM

## 2019-05-29 DIAGNOSIS — R05 Cough: Secondary | ICD-10-CM

## 2019-05-29 DIAGNOSIS — E782 Mixed hyperlipidemia: Secondary | ICD-10-CM | POA: Insufficient documentation

## 2019-05-29 DIAGNOSIS — Z0000 Encounter for general adult medical examination without abnormal findings: Secondary | ICD-10-CM | POA: Diagnosis not present

## 2019-05-29 DIAGNOSIS — Z888 Allergy status to other drugs, medicaments and biological substances status: Secondary | ICD-10-CM | POA: Insufficient documentation

## 2019-05-29 DIAGNOSIS — R7989 Other specified abnormal findings of blood chemistry: Secondary | ICD-10-CM

## 2019-05-29 DIAGNOSIS — J411 Mucopurulent chronic bronchitis: Secondary | ICD-10-CM

## 2019-05-29 DIAGNOSIS — Z79899 Other long term (current) drug therapy: Secondary | ICD-10-CM | POA: Diagnosis not present

## 2019-05-29 DIAGNOSIS — Z87891 Personal history of nicotine dependence: Secondary | ICD-10-CM | POA: Diagnosis not present

## 2019-05-29 DIAGNOSIS — R972 Elevated prostate specific antigen [PSA]: Secondary | ICD-10-CM

## 2019-05-29 DIAGNOSIS — I493 Ventricular premature depolarization: Secondary | ICD-10-CM | POA: Insufficient documentation

## 2019-05-29 DIAGNOSIS — R059 Cough, unspecified: Secondary | ICD-10-CM

## 2019-05-29 DIAGNOSIS — Z9889 Other specified postprocedural states: Secondary | ICD-10-CM | POA: Diagnosis not present

## 2019-05-29 DIAGNOSIS — Z7901 Long term (current) use of anticoagulants: Secondary | ICD-10-CM | POA: Insufficient documentation

## 2019-05-29 DIAGNOSIS — G4730 Sleep apnea, unspecified: Secondary | ICD-10-CM

## 2019-05-29 LAB — URINALYSIS, ROUTINE W REFLEX MICROSCOPIC
Bilirubin Urine: NEGATIVE
Hgb urine dipstick: NEGATIVE
Ketones, ur: NEGATIVE
Leukocytes,Ua: NEGATIVE
Nitrite: NEGATIVE
Specific Gravity, Urine: 1.025 (ref 1.000–1.030)
Total Protein, Urine: NEGATIVE
Urine Glucose: >=1000 — AB
Urobilinogen, UA: 0.2 (ref 0.0–1.0)
pH: 5.5 (ref 5.0–8.0)

## 2019-05-29 LAB — CBC WITH DIFFERENTIAL/PLATELET
Basophils Absolute: 0.1 10*3/uL (ref 0.0–0.1)
Basophils Relative: 0.9 % (ref 0.0–3.0)
Eosinophils Absolute: 0.4 10*3/uL (ref 0.0–0.7)
Eosinophils Relative: 5.1 % — ABNORMAL HIGH (ref 0.0–5.0)
HCT: 48.3 % (ref 39.0–52.0)
Hemoglobin: 16 g/dL (ref 13.0–17.0)
Lymphocytes Relative: 21.9 % (ref 12.0–46.0)
Lymphs Abs: 1.9 10*3/uL (ref 0.7–4.0)
MCHC: 33.2 g/dL (ref 30.0–36.0)
MCV: 88.2 fl (ref 78.0–100.0)
Monocytes Absolute: 0.5 10*3/uL (ref 0.1–1.0)
Monocytes Relative: 5.5 % (ref 3.0–12.0)
Neutro Abs: 5.8 10*3/uL (ref 1.4–7.7)
Neutrophils Relative %: 66.6 % (ref 43.0–77.0)
Platelets: 209 10*3/uL (ref 150.0–400.0)
RBC: 5.47 Mil/uL (ref 4.22–5.81)
RDW: 13.6 % (ref 11.5–15.5)
WBC: 8.7 10*3/uL (ref 4.0–10.5)

## 2019-05-29 LAB — LIPID PANEL
Cholesterol: 148 mg/dL (ref 0–200)
HDL: 34 mg/dL — ABNORMAL LOW (ref >39.00)
NonHDL: 113.6
Total CHOL/HDL Ratio: 4
Triglycerides: 255 mg/dL — ABNORMAL HIGH (ref 0.0–149.0)
VLDL: 51 mg/dL — ABNORMAL HIGH (ref 0.0–40.0)

## 2019-05-29 LAB — BASIC METABOLIC PANEL
BUN: 22 mg/dL (ref 6–23)
CO2: 25 mEq/L (ref 19–32)
Calcium: 9.5 mg/dL (ref 8.4–10.5)
Chloride: 105 mEq/L (ref 96–112)
Creatinine, Ser: 1.09 mg/dL (ref 0.40–1.50)
GFR: 66.51 mL/min (ref >60.00)
Glucose, Bld: 167 mg/dL — ABNORMAL HIGH (ref 70–99)
Potassium: 4.6 mEq/L (ref 3.5–5.1)
Sodium: 139 mEq/L (ref 135–145)

## 2019-05-29 LAB — MICROALBUMIN / CREATININE URINE RATIO
Creatinine,U: 117.8 mg/dL
Microalb Creat Ratio: 0.7 mg/g (ref 0.0–30.0)
Microalb, Ur: 0.9 mg/dL (ref 0.0–1.9)

## 2019-05-29 LAB — BRAIN NATRIURETIC PEPTIDE: Pro B Natriuretic peptide (BNP): 95 pg/mL (ref 0.0–100.0)

## 2019-05-29 LAB — HEMOGLOBIN A1C: Hgb A1c MFr Bld: 6.6 % — ABNORMAL HIGH (ref 4.6–6.5)

## 2019-05-29 LAB — TROPONIN I (HIGH SENSITIVITY): High Sens Troponin I: 5 ng/L (ref 2–17)

## 2019-05-29 LAB — PSA: PSA: 4.75 ng/mL — ABNORMAL HIGH (ref 0.10–4.00)

## 2019-05-29 LAB — LDL CHOLESTEROL, DIRECT: Direct LDL: 78 mg/dL

## 2019-05-29 IMAGING — DX DG CHEST 2V
2 series · 2 of 2 positions shown · non-contrast
Comparison: Cardiac CT [DATE], CTA chest [DATE]

CLINICAL DATA: Cough and congestion for 2 months, left sided pain
for 2 days

EXAM:
CHEST - 2 VIEW

[chest pa]
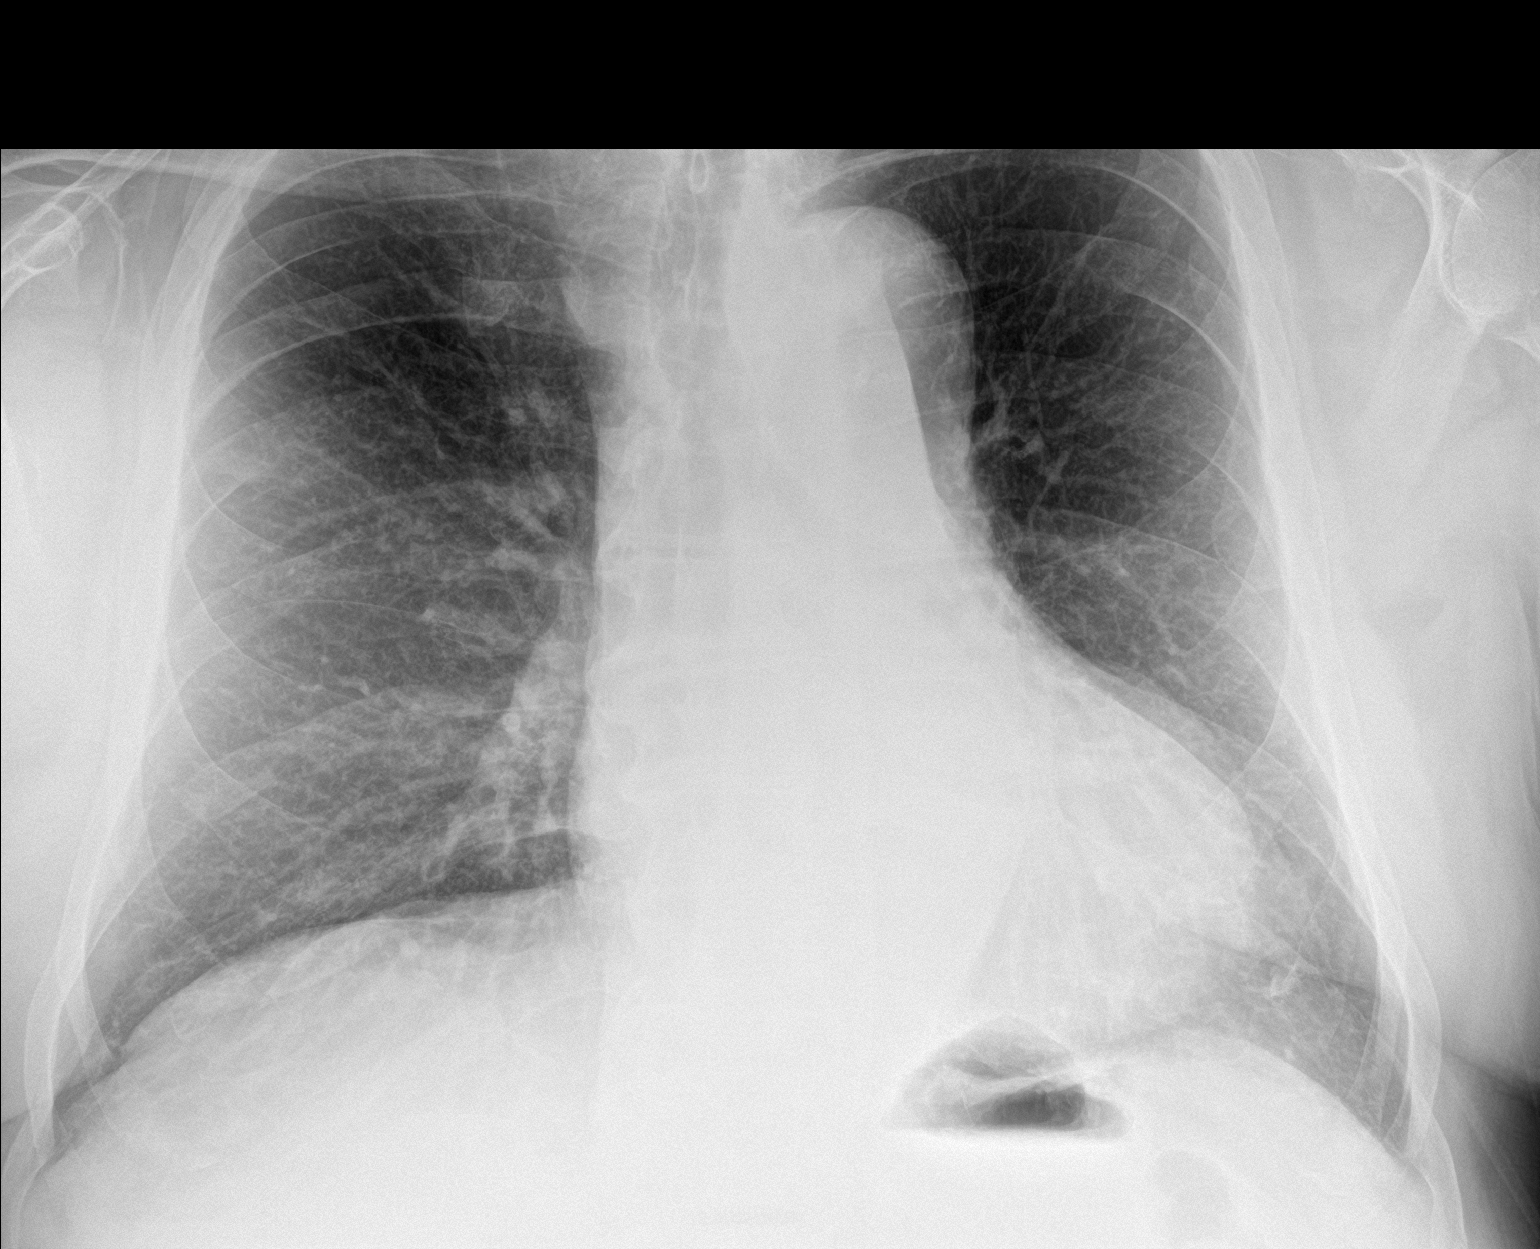

[chest lat]
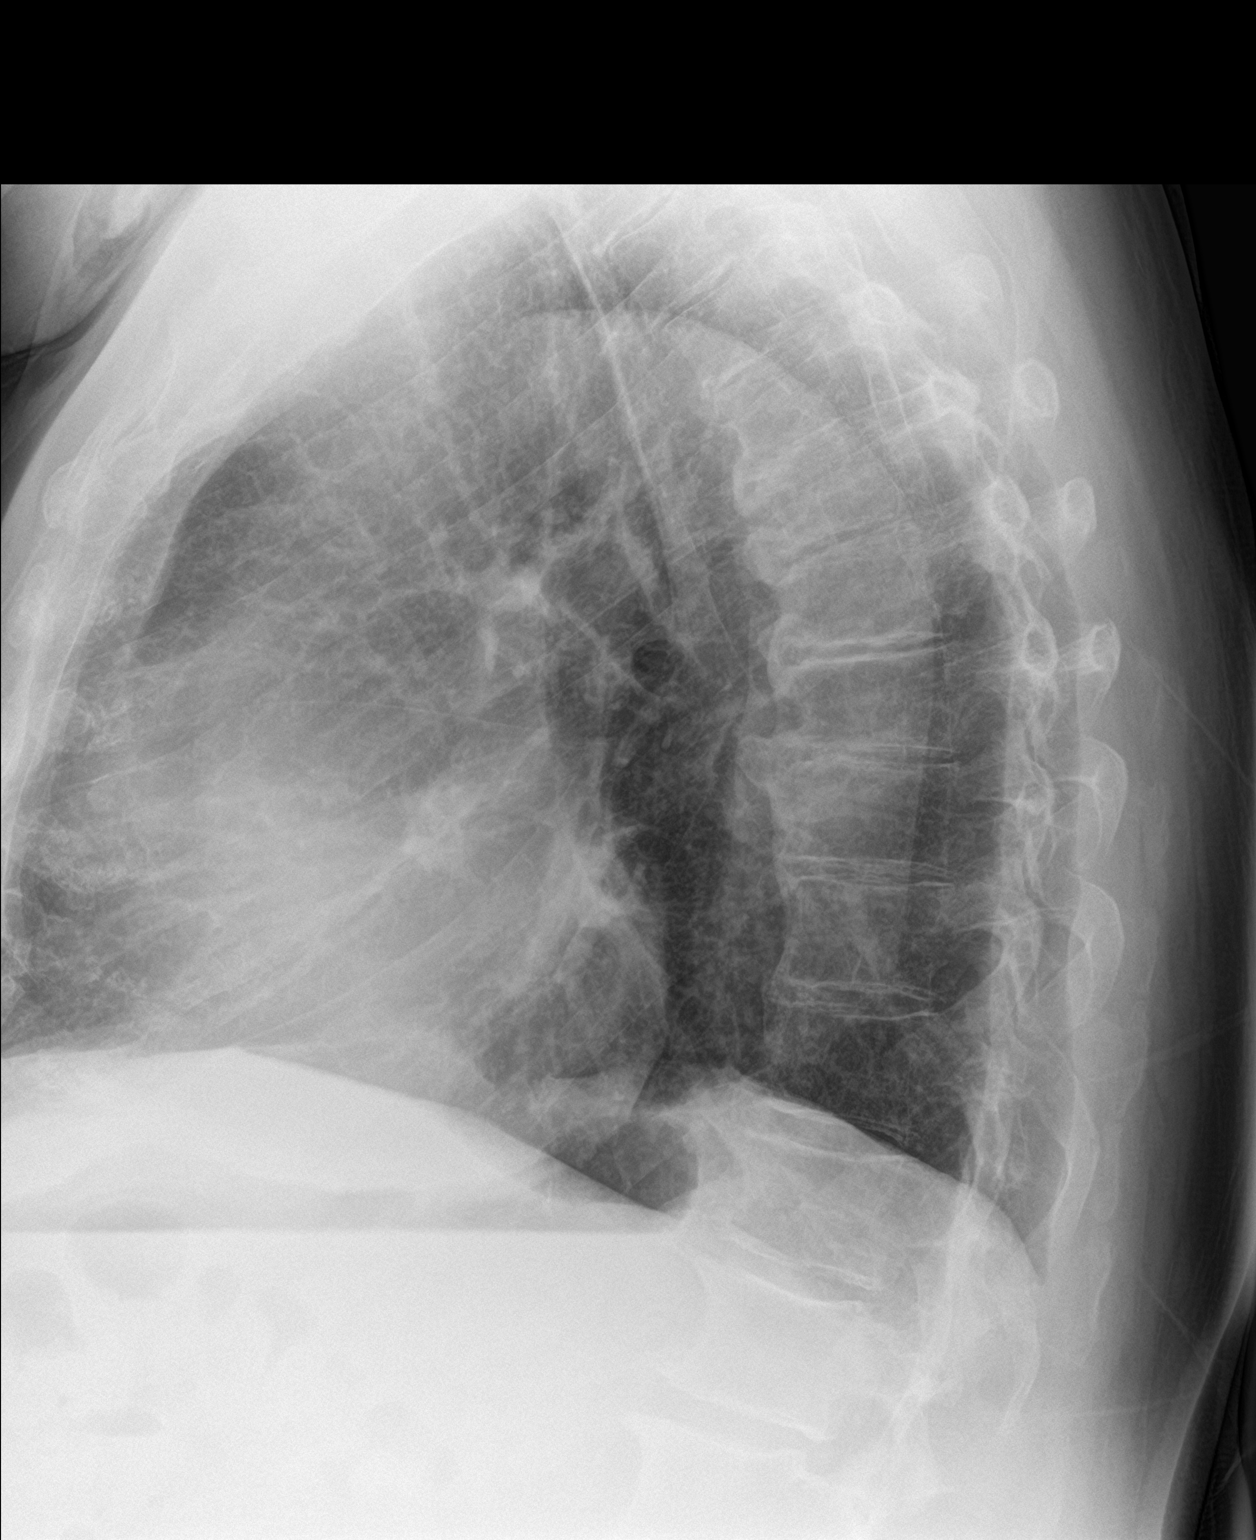

[2 of 2 positions shown; findings below may reference images not displayed]

FINDINGS: Coarse interstitial changes and hyperinflation with flattening of
the diaphragm similar to comparison studies. No pneumothorax or
effusion. No convincing features of edema. Known ascending thoracic
aortic aneurysm is not visualized. The cardiomediastinal contours
are otherwise unremarkable. No acute osseous or soft tissue
abnormality. Degenerative changes are present in the imaged spine
and shoulders.
IMPRESSION: 1.  No acute cardiopulmonary abnormality.
2. Stable coarse interstitial changes and hyperinflation suggesting
emphysematous change/smoking related changes.
3. Known ascending thoracic aortic aneurysm is not visualized on
today's study.

## 2019-05-29 MED ORDER — METOPROLOL SUCCINATE ER 25 MG PO TB24: 12.5000 mg | ORAL_TABLET | Freq: Every day | ORAL | 2 refills | Status: DC

## 2019-05-29 MED ORDER — ALFUZOSIN HCL ER 10 MG PO TB24: 10.0000 mg | ORAL_TABLET | Freq: Every day | ORAL | 1 refills | Status: DC

## 2019-05-29 MED ORDER — TRIAZOLAM 0.25 MG PO TABS: 0.2500 mg | ORAL_TABLET | Freq: Every evening | ORAL | 3 refills | Status: DC | PRN

## 2019-05-29 MED ORDER — SYNJARDY XR 25-1000 MG PO TB24: 1.0000 | ORAL_TABLET | Freq: Every day | ORAL | 0 refills | Status: DC

## 2019-05-29 MED ORDER — AMIODARONE HCL 200 MG PO TABS: ORAL_TABLET | ORAL | 0 refills | Status: DC

## 2019-05-29 NOTE — Patient Instructions (Addendum)
Start Amiodarone 200mg  taking twice daily for one month then reduce to 200mg  once daily Decrease Metoprolol to 1/2 tablet daily

## 2019-05-29 NOTE — Progress Notes (Signed)
Primary Care Physician: [name], MD Referring Physician: Dr. Curt Bears  Cardiologist: Dr. [name]    Clinton Black is a 73 y.o. male with a h/o of DM II, HTN, HLD, TAA, morbid obesity, and persistent A. Fib, EF of 39%,  DX with afib December 2019 and started on xarelto.CHA2DS2VASc of at least 4. DCCV 07/12/18 with ERAF. He was loaded on amiodarone with second DCCV unsuccessful 07/2018.   He recently had ablation one month ago  with Dr. Curt Bears and is back in clinic for f/u. He is in afib today. He feels it may be paroxysmal but he does not have a good way  to track it.  He feels his afib is more tolerable since ablation, but will feel it  several times a day but he was unsure. .   No swallowing or groin issues.   F/u in afib clinic 05/29/19. Zio patch placed  showed persistent afib. I discussed with Dr. Curt Bears and he feels he may need AAD plus DCCV to try to restore/maintain   Rhythm post ablation. Pt has used amiodarone in the past and is willing to restart this drug. Dicussed tikosyn but pt's wife needs supervision and he would not be able to come into the hospital for drug.   Today, he denies symptoms of palpitations, chest pain, shortness of breath, orthopnea, PND, lower extremity edema, dizziness, presyncope, syncope, or neurologic sequela. The patient is tolerating medications without difficulties and is otherwise without complaint today.   Past Medical History:  Diagnosis Date  . Arthritis   . BPH (benign prostatic hyperplasia)   . Diabetes mellitus type 2 in obese (HCC)    diet controlled  . HTN (hypertension)   . Hyperlipidemia, mixed   . Insomnia   . Obesity   . Thoracic aortic aneurysm, without rupture (HCC)    4.7 cm per chest ct with contrast 11-04-17   Past Surgical History:  Procedure Laterality Date  . ATRIAL FIBRILLATION ABLATION N/A 04/07/2019   Procedure: ATRIAL FIBRILLATION ABLATION;  Surgeon: [name], MD;  Location: [facility];  Service:  Cardiovascular;  Laterality: N/A;  . CARDIOVERSION N/A 07/12/2018   Procedure: CARDIOVERSION;  Surgeon: Thayer Headings, MD;  Location: [facility];  Service: Cardiovascular;  Laterality: N/A;  . CARDIOVERSION N/A 08/22/2018   Procedure: CARDIOVERSION;  Surgeon: Skeet Latch, MD;  Location: [facility];  Service: Cardiovascular;  Laterality: N/A;  . COLONOSCOPY  2016  . CYST EXCISION     polynomial  . CYSTOSCOPY WITH INSERTION OF UROLIFT N/A 04/18/2018   Procedure: CYSTOSCOPY WITH INSERTION OF UROLIFT;  Surgeon: Cleon Gustin, MD;  Location[facility];  Service: Urology;  Laterality: N/A;  . KNEE SURGERY Right 2000   arthroscopy  . RIGHT/LEFT HEART CATH AND CORONARY ANGIOGRAPHY N/A 01/30/2019   Procedure: RIGHT/LEFT HEART CATH AND CORONARY ANGIOGRAPHY;  Surgeon: [name], [name], MD;  Location: [facility];  Service: Cardiovascular;  Laterality: N/A;  . TONSILLECTOMY    . widson teeth extraction      Current Outpatient Medications  Medication Sig Dispense Refill  . APPLE CIDER VINEGAR PO Take 2 capsules by mouth at bedtime.    Marland Kitchen atorvastatin (LIPITOR) 20 MG tablet Take 1 tablet (20 mg total) by mouth at bedtime. 90 tablet 0  . Empagliflozin-metFORMIN HCl 5-500 MG TABS Take 1 tablet by mouth daily. (Patient taking differently: Take 1 tablet by mouth every evening. ) 180 tablet 0  . FLUZONE HIGH-DOSE QUADRIVALENT  0.7 ML SUSY TO BE ADMINISTERED BY PHARMACIST FOR IMMUNIZATION    . GARLIC PO Take 2 tablets by mouth every evening.     . hydrocortisone cream 1 % Apply 1 application topically daily as needed (rash).    Marland Kitchen losartan (COZAAR) 25 MG tablet TAKE 2 TABLETS BY MOUTH EVERY DAY (Patient taking differently: Take 50 mg by mouth every evening. ) 180 tablet 1  . Menthol-Methyl Salicylate (SALONPAS PAIN RELIEF PATCH EX) Apply 1 patch topically daily as needed (back pain).    . metoprolol succinate (TOPROL-XL) 25 MG 24 hr tablet TAKE 1 TABLET BY MOUTH EVERY DAY  (Patient taking differently: Take 25 mg by mouth every evening. ) 90 tablet 2  . rivaroxaban (XARELTO) 20 MG TABS tablet Take 1 tablet (20 mg total) by mouth daily with supper.    Marland Kitchen spironolactone (ALDACTONE) 25 MG tablet TAKE 0.5 TABLETS (12.5 MG TOTAL) BY MOUTH AT BEDTIME. 45 tablet 0  . Tetrahydrozoline HCl (VISINE OP) Place 1 drop into both eyes daily as needed (dry eyes).    . triazolam (HALCION) 0.25 MG tablet Take 1 tablet (0.25 mg total) by mouth at bedtime as needed for sleep. 30 tablet 0  . TURMERIC PO Take 10 mLs by mouth at bedtime.      Current Facility-Administered Medications  Medication Dose Route Frequency Provider Last Rate Last Admin  . sodium chloride flush (NS) 0.9 % injection 3 mL  3 mL Intravenous Q12H [name], [name], MD        Allergies  Allergen Reactions  . Quinolones Other (See Comments)    Patient was warned about not using Cipro and similar antibiotics. Recent studies have raised concern that fluoroquinolone antibiotics could be associated with an increased risk of aortic aneurysm Fluoroquinolones have non-antimicrobial properties that might jeopardise the integrity of the extracellular matrix of the vascular wall In a  propensity score matched cohort study in [place], there was a 66% increased rate of aortic aneurysm or dissection associated with oral fluoroquinolone use, compared wit    Social History   Socioeconomic History  . Marital status: Single    Spouse name: Not on file  . Number of children: Not on file  . Years of education: Not on file  . Highest education level: Not on file  Occupational History  . Not on file  Tobacco Use  . Smoking status: Former Smoker    Packs/day: 1.50    Years: 5.00    Pack years: 7.50    Types: Cigarettes    Quit date: 06/15/1994    Years since quitting: 24.9  . Smokeless tobacco: Never Used  Substance and Sexual Activity  . Alcohol use: Yes    Alcohol/week: 14.0 standard drinks    Types: 14 Shots of liquor  per week    Comment: 1 drink of liquor per day  . Drug use: Yes    Types: Marijuana    Comment: occasionally marijuana  . Sexual activity: Yes    Partners: Female    Birth control/protection: Condom    Comment: condom use most of the time but not always  Other Topics Concern  . Not on file  [employer]. married '72- 3 years/divorced; married '89- 3 years/divorced;. married '03 - 3 years/divorced. 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   Social Determinants of Health   Financial Resource Strain:   . Difficulty of Paying Living Expenses: Not on file  Food Insecurity:   .  Worried About Charity fundraiser in the Last Year: Not on file  . Ran Out of Food in the Last Year: Not on file  Transportation Needs:   . Lack of Transportation (Medical): Not on file  . Lack of Transportation (Non-Medical): Not on file  Physical Activity:   . Days of Exercise per Week: Not on file  . Minutes of Exercise per Session: Not on file  Stress:   . Feeling of Stress : Not on file  Social Connections:   . Frequency of Communication with Friends and Family: Not on file  . Frequency of Social Gatherings with Friends and Family: Not on file  . Attends Religious Services: Not on file  . Active Member of Clubs or Organizations: Not on file  . Attends Archivist Meetings: Not on file  . Marital Status: Not on file  Intimate Partner Violence:   . Fear of Current or Ex-Partner: Not on file  . Emotionally Abused: Not on file  . Physically Abused: Not on file  . Sexually Abused: Not on file    Family History  Problem Relation Age of Onset  . Aneurysm Father   . Heart disease Father   . Varicose Veins Mother   . Arthritis Mother   . Macular degeneration Mother   . Cancer Neg Hx   . Colon cancer Neg Hx   . Esophageal cancer Neg Hx   . Rectal cancer Neg Hx   . Stomach cancer Neg Hx     ROS- All systems are reviewed and negative except as per the  HPI above  Physical Exam: Vitals:   05/29/19 1140  BP: 114/66  Pulse: 95  Weight: 134.1 kg  Height: 6\' 5"  (1.956 m)   Wt Readings from Last 3 Encounters:  05/29/19 134.1 kg  05/05/19 134 kg  04/17/19 133.1 kg    Labs: Lab Results  Component Value Date   NA 140 03/30/2019   K 4.9 03/30/2019   CL 106 03/30/2019   CO2 19 (L) 03/30/2019   GLUCOSE 134 (H) 03/30/2019   BUN 23 03/30/2019   CREATININE 1.12 03/30/2019   CALCIUM 9.6 03/30/2019   No results found for: INR Lab Results  Component Value Date   CHOL 110 06/01/2018   HDL 34.30 (L) 06/01/2018   LDLCALC 48 06/01/2018   TRIG 137.0 06/01/2018     GEN- The patient is well appearing, alert and oriented x 3 today.   Head- normocephalic, atraumatic Eyes-  Sclera clear, conjunctiva pink Ears- hearing intact Oropharynx- clear Neck- supple, no JVP Lymph- no cervical lymphadenopathy Lungs- Clear to ausculation bilaterally, normal work of breathing Heart- irregular rate and rhythm, no murmurs, rubs or gallops, PMI not laterally displaced GI- soft, NT, ND, + BS Extremities- no clubbing, cyanosis, or edema MS- no significant deformity or atrophy Skin- no rash or lesion Psych- euthymic mood, full affect Neuro- strength and sensation are intact  EKG- afib at 95 bpm, qrs int 98 ms, qtc 444 ms    Assessment and Plan: 1. Persistent  afib Abaltion 04/07/19 Zio patch confirmed persistent afib  Discussed with Dr. Curt Bears and he agrees it will probably take AAD plus cardioversion to restore SR  amio vrs Tikosyn discussed Pt does not feel he can leave wife alone at home  He decided to go with amiodarone even though it did not work well for him in the past  It was explained to pt that even though amiodarone did not work well  In the past, often post ablation, the drugs will work more effectively  Will start 200 mg bid, reduce BB by 1/2 dose   2. CHA2DS2VASc of at least 4 Continue xarelto 20 mg daily No missed doses x 3  weeks   3. HTN Stable   F/u  here  In one week for repeat EKG   Butch Penny C. , [facility] [address] [place], [place] [address] [phone]

## 2019-05-29 NOTE — Patient Instructions (Signed)

## 2019-05-29 NOTE — Progress Notes (Signed)
Subjective:  Patient ID: Clinton Black, male    DOB: Jul 10, 1945  Age: 73 y.o. MRN: [id number]  CC: Annual Exam, Hyperlipidemia, Hypertension, Diabetes, and Cough  This visit occurred during the SARS-CoV-2 public health emergency.  Safety protocols were in place, including screening questions prior to the visit, additional usage of staff PPE, and extensive cleaning of exam room while observing appropriate contact time as indicated for disinfecting solutions.    HPI Clinton Black presents for a CPX.  He complains of a 58-monthhistory of cough that is rarely productive of yellow phlegm.  He also complains of chronic palpitations, shortness of breath, and fatigue.  He denies any recent episodes of chest pain, diaphoresis, or near syncope.  Outpatient Medications Prior to Visit  Medication Sig Dispense Refill  . amiodarone (PACERONE) 200 MG tablet Take one tablet twice daily for one month then reduce to one tablet daily 60 tablet 0  . atorvastatin (LIPITOR) 20 MG tablet Take 1 tablet (20 mg total) by mouth at bedtime. 90 tablet 0  . FLUZONE HIGH-DOSE QUADRIVALENT 0.7 ML SUSY TO BE ADMINISTERED BY PHARMACIST FOR IMMUNIZATION    . hydrocortisone cream 1 % Apply 1 application topically daily as needed (rash).    .Marland Kitchenlosartan (COZAAR) 25 MG tablet TAKE 2 TABLETS BY MOUTH EVERY DAY (Patient taking differently: Take 50 mg by mouth every evening. ) 180 tablet 1  . Menthol-Methyl Salicylate (SALONPAS PAIN RELIEF PATCH EX) Apply 1 patch topically daily as needed (back pain).    . metoprolol succinate (TOPROL-XL) 25 MG 24 hr tablet Take 0.5 tablets (12.5 mg total) by mouth daily. 90 tablet 2  . rivaroxaban (XARELTO) 20 MG TABS tablet Take 1 tablet (20 mg total) by mouth daily with supper.    .Marland Kitchenspironolactone (ALDACTONE) 25 MG tablet TAKE 0.5 TABLETS (12.5 MG TOTAL) BY MOUTH AT BEDTIME. 45 tablet 0  . Empagliflozin-metFORMIN HCl 5-500 MG TABS Take 1 tablet by mouth daily. (Patient taking differently:  Take 1 tablet by mouth every evening. ) 180 tablet 0  . triazolam (HALCION) 0.25 MG tablet Take 1 tablet (0.25 mg total) by mouth at bedtime as needed for sleep. 30 tablet 0  . TURMERIC PO Take 10 mLs by mouth at bedtime.     . APPLE CIDER VINEGAR PO Take 2 capsules by mouth at bedtime.    .Marland KitchenGARLIC PO Take 2 tablets by mouth every evening.     . Tetrahydrozoline HCl (VISINE OP) Place 1 drop into both eyes daily as needed (dry eyes).    . sodium chloride flush (NS) 0.9 % injection 3 mL      No facility-administered medications prior to visit.    ROS Review of Systems  Constitutional: Positive for fatigue and unexpected weight change.  Eyes: Negative.  Negative for visual disturbance.  Respiratory: Positive for cough and shortness of breath. Negative for chest tightness and wheezing.   Cardiovascular: Positive for palpitations. Negative for chest pain and leg swelling.  Gastrointestinal: Negative for abdominal pain, constipation, diarrhea, nausea and vomiting.  Endocrine: Negative.   Genitourinary: Positive for difficulty urinating and frequency. Negative for decreased urine volume, dysuria, flank pain, hematuria, penile pain, penile swelling, scrotal swelling, testicular pain and urgency.       He complains of weak urine stream, hesitancy, and frequency.  Musculoskeletal: Negative.  Negative for myalgias.  Skin: Negative.   Neurological: Negative.  Negative for dizziness, weakness, light-headedness and headaches.  Hematological: Negative for adenopathy. Does not bruise/bleed easily.  Psychiatric/Behavioral: Positive for sleep disturbance. Negative for confusion, decreased concentration, dysphoric mood and hallucinations. The patient is not nervous/anxious.     Objective:  BP (!) 114/58 (BP Location: Left Arm, Patient Position: Sitting, Cuff Size: Large)   Pulse 75   Temp 98 F (36.7 C) (Oral)   Ht 6' 5" (1.956 m)   Wt 295 lb (133.8 kg)   SpO2 98%   BMI 34.98 kg/m   BP Readings  from Last 3 Encounters:  05/29/19 114/66  05/29/19 (!) 114/58  05/05/19 118/60    Wt Readings from Last 3 Encounters:  05/29/19 295 lb 9.6 oz (134.1 kg)  05/29/19 295 lb (133.8 kg)  05/05/19 295 lb 6.4 oz (134 kg)    Physical Exam Vitals reviewed.  Constitutional:      General: He is not in acute distress.    Appearance: He is obese. He is not toxic-appearing or diaphoretic.  HENT:     Nose: Nose normal.     Mouth/Throat:     Mouth: Mucous membranes are moist.  Eyes:     General: No scleral icterus.    Conjunctiva/sclera: Conjunctivae normal.  Cardiovascular:     Rate and Rhythm: Normal rate. Rhythm irregularly irregular.     Heart sounds: No murmur.  Pulmonary:     Effort: Pulmonary effort is normal.     Breath sounds: No stridor. No wheezing, rhonchi or rales.  Abdominal:     General: Abdomen is protuberant. Bowel sounds are normal. There is no distension.     Palpations: There is no hepatomegaly, splenomegaly or mass.     Tenderness: There is no abdominal tenderness. There is no guarding.  Genitourinary:    Pubic Area: No rash.      Penis: Normal. No discharge, swelling or lesions.      Testes: Normal.        Right: Mass not present.     Epididymis:     Right: Normal.     Left: Normal.     Prostate: Enlarged (2+ smooth symm BPH). Not tender and no nodules present.     Rectum: Normal. Guaiac result negative. No mass, tenderness, anal fissure, external hemorrhoid or internal hemorrhoid. Normal anal tone.  Musculoskeletal:        General: Normal range of motion.     Cervical back: Neck supple.     Right lower leg: No edema.  Lymphadenopathy:     Cervical: No cervical adenopathy.  Skin:    General: Skin is warm and dry.  Neurological:     General: No focal deficit present.     Mental Status: He is alert.  Psychiatric:        Mood and Affect: Mood normal.        Behavior: Behavior normal.        Thought Content: Thought content normal.        Judgment:  Judgment normal.     Lab Results  Component Value Date   WBC 8.7 05/29/2019   HGB 16.0 05/29/2019   HCT 48.3 05/29/2019   PLT 209.0 05/29/2019   GLUCOSE 167 (H) 05/29/2019   CHOL 148 05/29/2019   TRIG 255.0 (H) 05/29/2019   HDL 34.00 (L) 05/29/2019   LDLDIRECT 78.0 05/29/2019   LDLCALC 48 06/01/2018   ALT 21 06/01/2018   AST 15 06/01/2018   NA 139 05/29/2019   K 4.6 05/29/2019   CL 105 05/29/2019   CREATININE 1.09 05/29/2019   BUN 22 05/29/2019   CO2  25 05/29/2019   TSH 4.27 05/29/2019   PSA 4.75 (H) 05/29/2019   HGBA1C 6.6 (H) 05/29/2019   MICROALBUR 0.9 05/29/2019    DG Chest 2 View  Result Date: 05/29/2019 CLINICAL DATA:  Cough and congestion for 2 months, left sided pain for 2 days EXAM: CHEST - 2 VIEW COMPARISON:  Cardiac CT 04/03/2019, CTA chest 05/19/2018 FINDINGS: Coarse interstitial changes and hyperinflation with flattening of the diaphragm similar to comparison studies. No pneumothorax or effusion. No convincing features of edema. Known ascending thoracic aortic aneurysm is not visualized. The cardiomediastinal contours are otherwise unremarkable. No acute osseous or soft tissue abnormality. Degenerative changes are present in the imaged spine and shoulders. IMPRESSION: 1.  No acute cardiopulmonary abnormality. 2. Stable coarse interstitial changes and hyperinflation suggesting emphysematous change/smoking related changes. 3. Known ascending thoracic aortic aneurysm is not visualized on today's study. Electronically Signed   By: Price  DeHay M.D.   On: 05/29/2019 20:45    Assessment & Plan:   Lain was seen today for annual exam, hyperlipidemia, hypertension, diabetes and cough.  Diagnoses and all orders for this visit:  Essential hypertension- His blood pressure is adequately well controlled. -     CBC with Differential; Future -     Basic metabolic panel; Future -     Urinalysis, Routine w reflex microscopic; Future  Atrial fibrillation, new onset (HCC)-  He is being evaluated and treated by the A. fib clinic.  Type 2 diabetes mellitus with complication, without long-term current use of insulin (HCC)- His A1c is at 6.6%.  His blood sugars are adequately well controlled.  I gave him Synjardy samples today because he complained about the cost. -     Hemoglobin A1c; Future -     Microalbumin / creatinine urine ratio; Future -     Empagliflozin-metFORMIN HCl ER (SYNJARDY XR) 25-1000 MG TB24; Take 1 tablet by mouth daily.  Routine general medical examination at a health care facility- Exam completed, labs reviewed, vaccines reviewed and updated, cancer screenings are up-to-date, patient education was given.  Hyperlipidemia LDL goal <130  - He has achieved his LDL goal and is doing well on the statin. -     Lipid panel; Future  TSH elevation- His TFTs are normal now. -     Lipid panel; Future -     Thyroid Panel With TSH; Future -     Thyroid peroxidase antibody; Future -     Thyroxine binding globulin; Future  Benign prostatic hyperplasia without lower urinary tract symptoms- He is symptomatic with this so I recommended that he start taking a peripheral alpha-blocker. -     PSA; Future -     alfuzosin (UROXATRAL) 10 MG 24 hr tablet; Take 1 tablet (10 mg total) by mouth daily with breakfast.  Cough- His chest x-ray is consistent with emphysema. -     DG Chest 2 View; Future  Chronic diastolic CHF (congestive heart failure), NYHA class 2 (HCC)- His enzymes are normal.  His volume status is normal.  I do not think the cough is related to pulmonary edema. -     Troponin I (High Sensitivity); Future -     Brain natriuretic peptide (BNP); Future  BPH with elevated PSA- His PSA has quadrupled over the last year.  I recommended that he see urology to consider screening options for prostate cancer. -     Ambulatory referral to Urology  Insomnia w/ sleep apnea -     triazolam (HALCION) 0.25 MG   tablet; Take 1 tablet (0.25 mg total) by mouth at  bedtime as needed for sleep.  Mucopurulent chronic bronchitis (HCC)- He is symptomatic with this so I recommended that he start using a LAMA. -     Glycopyrrolate (LONHALA MAGNAIR REFILL KIT) 25 MCG/ML SOLN; Inhale 1 Act into the lungs 2 (two) times daily. -     Glycopyrrolate (LONHALA MAGNAIR STARTER KIT) 25 MCG/ML SOLN; Inhale 60 mLs into the lungs 2 (two) times daily.   I have discontinued Kolsen P. Salone's TURMERIC PO, APPLE CIDER VINEGAR PO, GARLIC PO, Tetrahydrozoline HCl (VISINE OP), and Empagliflozin-metFORMIN HCl. I am also having him start on alfuzosin, Synjardy XR, [place] Magnair Refill Kit, and [place] Magnair Starter Kit. Additionally, I am having him maintain his hydrocortisone cream, Menthol-Methyl Salicylate (SALONPAS PAIN RELIEF PATCH EX), rivaroxaban, losartan, spironolactone, atorvastatin, Fluzone High-Dose Quadrivalent, amiodarone, metoprolol succinate, and triazolam. We will stop administering sodium chloride flush.  Meds ordered this encounter  Medications  . alfuzosin (UROXATRAL) 10 MG 24 hr tablet    Sig: Take 1 tablet (10 mg total) by mouth daily with breakfast.    Dispense:  90 tablet    Refill:  1  . Empagliflozin-metFORMIN HCl ER (SYNJARDY XR) 25-1000 MG TB24    Sig: Take 1 tablet by mouth daily.    Dispense:  84 tablet    Refill:  0  . triazolam (HALCION) 0.25 MG tablet    Sig: Take 1 tablet (0.25 mg total) by mouth at bedtime as needed for sleep.    Dispense:  30 tablet    Refill:  3    Not to exceed 2 additional fills before 06/07/2019  . Glycopyrrolate (LONHALA MAGNAIR REFILL KIT) 25 MCG/ML SOLN    Sig: Inhale 1 Act into the lungs 2 (two) times daily.    Dispense:  180 mL    Refill:  1  . Glycopyrrolate (LONHALA MAGNAIR STARTER KIT) 25 MCG/ML SOLN    Sig: Inhale 60 mLs into the lungs 2 (two) times daily.    Dispense:  60 mL    Refill:  5     Follow-up: Return in about 3 months (around 08/27/2019).   , MD 

## 2019-05-30 ENCOUNTER — Encounter: Payer: Self-pay | Admitting: Internal Medicine

## 2019-05-30 DIAGNOSIS — J411 Mucopurulent chronic bronchitis: Secondary | ICD-10-CM | POA: Insufficient documentation

## 2019-05-30 MED ORDER — LONHALA MAGNAIR REFILL KIT 25 MCG/ML IN SOLN: 1.0000 | Freq: Two times a day (BID) | RESPIRATORY_TRACT | 1 refills | Status: DC

## 2019-05-30 MED ORDER — LONHALA MAGNAIR STARTER KIT 25 MCG/ML IN SOLN: 60.0000 mL | Freq: Two times a day (BID) | RESPIRATORY_TRACT | 5 refills | Status: DC

## 2019-05-31 LAB — THYROID PANEL WITH TSH
Free Thyroxine Index: 1.8 (ref 1.4–3.8)
T3 Uptake: 32 % (ref 22–35)
T4, Total: 5.6 ug/dL (ref 4.9–10.5)
TSH: 4.27 mIU/L (ref 0.40–4.50)

## 2019-05-31 LAB — THYROXINE BINDING GLOBULIN: TBG: 12.9 ug/mL (ref 12.7–25.1)

## 2019-05-31 LAB — THYROID PEROXIDASE ANTIBODY: Thyroperoxidase Ab SerPl-aCnc: <1 IU/mL (ref <9)

## 2019-06-01 ENCOUNTER — Encounter: Payer: Self-pay | Admitting: Internal Medicine

## 2019-06-01 ENCOUNTER — Telehealth: Payer: Self-pay | Admitting: Internal Medicine

## 2019-06-01 NOTE — Telephone Encounter (Signed)
Malachy Mood, from [facility], called stating they are closing the case for the inhaler due to the pt declining the inhaler on 05/31/2019.     [phone] opt. 1

## 2019-06-05 ENCOUNTER — Encounter (HOSPITAL_COMMUNITY): Payer: Self-pay | Admitting: Nurse Practitioner

## 2019-06-05 ENCOUNTER — Ambulatory Visit (HOSPITAL_COMMUNITY)
Admission: RE | Admit: 2019-06-05 | Discharge: 2019-06-05 | Disposition: A | Discharge status: Home / Self Care | Payer: Medicare HMO | Source: Ambulatory Visit | Attending: Nurse Practitioner | Admitting: Nurse Practitioner

## 2019-06-05 ENCOUNTER — Other Ambulatory Visit: Payer: Self-pay

## 2019-06-05 VITALS — BP 98/62 | HR 89

## 2019-06-05 DIAGNOSIS — Z79899 Other long term (current) drug therapy: Secondary | ICD-10-CM | POA: Insufficient documentation

## 2019-06-05 DIAGNOSIS — I4819 Other persistent atrial fibrillation: Secondary | ICD-10-CM | POA: Insufficient documentation

## 2019-06-05 NOTE — Progress Notes (Signed)
Pt is in for EKG after being started on amiodarone 200 mg bid for persistent afib after ablation. He feels no different. Qtc acceptable  at 452 ms. Rate is afib at 89 bpm.  F/u here 12/31. At that point will schedule cardioversion a couple weeks out.

## 2019-06-06 ENCOUNTER — Telehealth: Payer: Self-pay

## 2019-06-06 NOTE — Telephone Encounter (Signed)
[facility] is needing information from pt including authorization to ship the [place] directly at patient.   Left detailed message for pt informing of same.

## 2019-06-15 ENCOUNTER — Encounter (HOSPITAL_COMMUNITY): Payer: Self-pay | Admitting: Nurse Practitioner

## 2019-06-15 ENCOUNTER — Other Ambulatory Visit: Payer: Self-pay

## 2019-06-15 ENCOUNTER — Ambulatory Visit (HOSPITAL_COMMUNITY)
Admission: RE | Admit: 2019-06-15 | Discharge: 2019-06-15 | Disposition: A | Discharge status: Home / Self Care | Payer: Medicare HMO | Source: Ambulatory Visit | Attending: Nurse Practitioner | Admitting: Nurse Practitioner

## 2019-06-15 ENCOUNTER — Other Ambulatory Visit (HOSPITAL_COMMUNITY): Payer: Self-pay | Admitting: *Deleted

## 2019-06-15 VITALS — BP 110/70 | HR 77 | Ht 77.0 in | Wt 297.5 lb

## 2019-06-15 DIAGNOSIS — Z8249 Family history of ischemic heart disease and other diseases of the circulatory system: Secondary | ICD-10-CM | POA: Insufficient documentation

## 2019-06-15 DIAGNOSIS — I10 Essential (primary) hypertension: Secondary | ICD-10-CM | POA: Insufficient documentation

## 2019-06-15 DIAGNOSIS — I712 Thoracic aortic aneurysm, without rupture: Secondary | ICD-10-CM | POA: Insufficient documentation

## 2019-06-15 DIAGNOSIS — Z888 Allergy status to other drugs, medicaments and biological substances status: Secondary | ICD-10-CM | POA: Insufficient documentation

## 2019-06-15 DIAGNOSIS — Z87891 Personal history of nicotine dependence: Secondary | ICD-10-CM | POA: Insufficient documentation

## 2019-06-15 DIAGNOSIS — Z79899 Other long term (current) drug therapy: Secondary | ICD-10-CM | POA: Insufficient documentation

## 2019-06-15 DIAGNOSIS — M1990 Unspecified osteoarthritis, unspecified site: Secondary | ICD-10-CM | POA: Insufficient documentation

## 2019-06-15 DIAGNOSIS — Z7901 Long term (current) use of anticoagulants: Secondary | ICD-10-CM | POA: Insufficient documentation

## 2019-06-15 DIAGNOSIS — I4819 Other persistent atrial fibrillation: Secondary | ICD-10-CM | POA: Diagnosis not present

## 2019-06-15 DIAGNOSIS — E782 Mixed hyperlipidemia: Secondary | ICD-10-CM | POA: Insufficient documentation

## 2019-06-15 DIAGNOSIS — Z7984 Long term (current) use of oral hypoglycemic drugs: Secondary | ICD-10-CM | POA: Insufficient documentation

## 2019-06-15 DIAGNOSIS — D6869 Other thrombophilia: Secondary | ICD-10-CM

## 2019-06-15 DIAGNOSIS — Z6835 Body mass index (BMI) 35.0-35.9, adult: Secondary | ICD-10-CM | POA: Diagnosis not present

## 2019-06-15 DIAGNOSIS — G4700 Insomnia, unspecified: Secondary | ICD-10-CM | POA: Diagnosis not present

## 2019-06-15 LAB — CBC
HCT: 47.5 % (ref 39.0–52.0)
Hemoglobin: 15.1 g/dL (ref 13.0–17.0)
MCH: 29.2 pg (ref 26.0–34.0)
MCHC: 31.8 g/dL (ref 30.0–36.0)
MCV: 91.9 fL (ref 80.0–100.0)
Platelets: 190 10*3/uL (ref 150–400)
RBC: 5.17 MIL/uL (ref 4.22–5.81)
RDW: 12.7 % (ref 11.5–15.5)
WBC: 7.9 10*3/uL (ref 4.0–10.5)
nRBC: 0 % (ref 0.0–0.2)

## 2019-06-15 LAB — BASIC METABOLIC PANEL
Anion gap: 8 (ref 5–15)
BUN: 17 mg/dL (ref 8–23)
CO2: 25 mmol/L (ref 22–32)
Calcium: 9.2 mg/dL (ref 8.9–10.3)
Chloride: 108 mmol/L (ref 98–111)
Creatinine, Ser: 1.14 mg/dL (ref 0.61–1.24)
GFR calc Af Amer: >60 mL/min (ref >60)
GFR calc non Af Amer: >60 mL/min (ref >60)
Glucose, Bld: 132 mg/dL — ABNORMAL HIGH (ref 70–99)
Potassium: 4.7 mmol/L (ref 3.5–5.1)
Sodium: 141 mmol/L (ref 135–145)

## 2019-06-15 NOTE — Patient Instructions (Signed)
Cardioversion scheduled for 06/28/2019  - Arrive at the [facility] and go to admitting at [facility] not eat or drink anything after midnight the night prior to your procedure.  - Take all your morning medication with a sip of water prior to arrival.  - You will not be able to drive home after your procedure.  Continue Amiodarone 200mg  twice daily until follow-up

## 2019-06-15 NOTE — H&P (View-Only) (Signed)
Primary Care Physician: [name], MD Referring Physician: Dr. Curt Bears  Cardiologist: Dr. [name]    [name] is a 73 y.o. male with a h/o of DM II, HTN, HLD, TAA, morbid obesity, and persistent A. Fib, EF of 39%,  DX with afib December 2019 and started on xarelto.CHA2DS2VASc of at least 4. DCCV 07/12/18 with ERAF. He was loaded on amiodarone with second DCCV unsuccessful 07/2018.   He recently had ablation one month ago  with Dr. Curt Bears and is back in clinic for f/u. He is in afib today. He feels it may be paroxysmal but he does not have a good way  to track it.  He feels his afib is more tolerable since ablation, but will feel it  several times a day but he was unsure. .   No swallowing or groin issues.   F/u in afib clinic 05/29/19. Zio patch placed  showed persistent afib. I discussed with Dr. Curt Bears and he feels he may need AAD plus DCCV to try to restore/maintain   Rhythm post ablation. Pt has used amiodarone in the past and is willing to restart this drug. Dicussed tikosyn but pt's wife needs supervision and he would not be able to come into the hospital for drug.   F/u in afib clinic, 06/15/19. He is in rate controlled afib and tolerating amiodarone well. Fatigue biggest compliant. Will go ahead and plan on DCCV. He had a delayed dose of xarelto (lest than 12 hours, checked with PharmD not considered a missed dose ) 2 nights ago.   Today, he denies symptoms of palpitations, chest pain, shortness of breath, orthopnea, PND, lower extremity edema, dizziness, presyncope, syncope, or neurologic sequela. The patient is tolerating medications without difficulties and is otherwise without complaint today.   Past Medical History:  Diagnosis Date  . Arthritis   . BPH (benign prostatic hyperplasia)   . Diabetes mellitus type 2 in obese (HCC)    diet controlled  . HTN (hypertension)   . Hyperlipidemia, mixed   . Insomnia   . Obesity   . Thoracic aortic aneurysm, without rupture  (HCC)    4.7 cm per chest ct with contrast 11-04-17   Past Surgical History:  Procedure Laterality Date  . ATRIAL FIBRILLATION ABLATION N/A 04/07/2019   Procedure: ATRIAL FIBRILLATION ABLATION;  Surgeon: [name], MD;  Location: [place] [facility];  Service: Cardiovascular;  Laterality: N/A;  . CARDIOVERSION N/A 07/12/2018   Procedure: CARDIOVERSION;  Surgeon: Thayer Headings, MD;  Location: [facility];  Service: Cardiovascular;  Laterality: N/A;  . CARDIOVERSION N/A 08/22/2018   Procedure: CARDIOVERSION;  Surgeon: Skeet Latch, MD;  Location: [place];  Service: Cardiovascular;  Laterality: N/A;  . COLONOSCOPY  2016  . CYST EXCISION     polynomial  . CYSTOSCOPY WITH INSERTION OF UROLIFT N/A 04/18/2018   Procedure: CYSTOSCOPY WITH INSERTION OF UROLIFT;  Surgeon: Cleon Gustin, MD;  Location: [facility];  Service: Urology;  Laterality: N/A;  . KNEE SURGERY Right 2000   arthroscopy  . RIGHT/LEFT HEART CATH AND CORONARY ANGIOGRAPHY N/A 01/30/2019   Procedure: RIGHT/LEFT HEART CATH AND CORONARY ANGIOGRAPHY;  Surgeon: [name], [name], MD;  Location: [place] [facility];  Service: Cardiovascular;  Laterality: N/A;  . TONSILLECTOMY    . widson teeth extraction      Current Outpatient Medications  Medication Sig Dispense Refill  . alfuzosin (UROXATRAL) 10 MG 24 hr tablet Take 1 tablet (10 mg total) by mouth  daily with breakfast. 90 tablet 1  . amiodarone (PACERONE) 200 MG tablet Take one tablet twice daily for one month then reduce to one tablet daily 60 tablet 0  . atorvastatin (LIPITOR) 20 MG tablet Take 1 tablet (20 mg total) by mouth at bedtime. 90 tablet 0  . Empagliflozin-metFORMIN HCl ER (SYNJARDY XR) 25-1000 MG TB24 Take 1 tablet by mouth daily. 84 tablet 0  . FLUZONE HIGH-DOSE QUADRIVALENT 0.7 ML SUSY TO BE ADMINISTERED BY PHARMACIST FOR IMMUNIZATION    . Glycopyrrolate (LONHALA MAGNAIR REFILL KIT) 25 MCG/ML SOLN Inhale 1 Act into the lungs 2  (two) times daily. 180 mL 1  . Glycopyrrolate (LONHALA MAGNAIR STARTER KIT) 25 MCG/ML SOLN Inhale 60 mLs into the lungs 2 (two) times daily. 60 mL 5  . hydrocortisone cream 1 % Apply 1 application topically daily as needed (rash).    Marland Kitchen losartan (COZAAR) 25 MG tablet TAKE 2 TABLETS BY MOUTH EVERY DAY (Patient taking differently: Take 50 mg by mouth every evening. ) 180 tablet 1  . Menthol-Methyl Salicylate (SALONPAS PAIN RELIEF PATCH EX) Apply 1 patch topically daily as needed (back pain).    . metoprolol succinate (TOPROL-XL) 25 MG 24 hr tablet Take 0.5 tablets (12.5 mg total) by mouth daily. 90 tablet 2  . rivaroxaban (XARELTO) 20 MG TABS tablet Take 1 tablet (20 mg total) by mouth daily with supper.    Marland Kitchen spironolactone (ALDACTONE) 25 MG tablet TAKE 0.5 TABLETS (12.5 MG TOTAL) BY MOUTH AT BEDTIME. 45 tablet 0  . triazolam (HALCION) 0.25 MG tablet Take 1 tablet (0.25 mg total) by mouth at bedtime as needed for sleep. 30 tablet 3   No current facility-administered medications for this encounter.    Allergies  Allergen Reactions  . Quinolones Other (See Comments)    Patient was warned about not using Cipro and similar antibiotics. Recent studies have raised concern that fluoroquinolone antibiotics could be associated with an increased risk of aortic aneurysm Fluoroquinolones have non-antimicrobial properties that might jeopardise the integrity of the extracellular matrix of the vascular wall In a  propensity score matched cohort study in [place], there was a 66% increased rate of aortic aneurysm or dissection associated with oral fluoroquinolone use, compared wit    Social History   Socioeconomic History  . Marital status: Single    Spouse name: Not on file  . Number of children: Not on file  . Years of education: Not on file  . Highest education level: Not on file  Occupational History  . Not on file  Tobacco Use  . Smoking status: Former Smoker    Packs/day: 1.50    Years: 5.00     Pack years: 7.50    Types: Cigarettes    Quit date: 06/15/1994    Years since quitting: 25.0  . Smokeless tobacco: Never Used  Substance and Sexual Activity  . Alcohol use: Yes    Alcohol/week: 14.0 standard drinks    Types: 14 Shots of liquor per week    Comment: 1 drink of liquor per day  . Drug use: Yes    Types: Marijuana    Comment: occasionally marijuana  . Sexual activity: Yes    Partners: Female    Birth control/protection: Condom    Comment: condom use most of the time but not always  Other Topics Concern  . Not on file  [employer] - [employer] and [employer]. married '72- 3 years/divorced; married '89- 3 years/divorced;. married '03 -  3 years/divorced. 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   Social Determinants of Health   Financial Resource Strain:   . Difficulty of Paying Living Expenses: Not on file  Food Insecurity:   . Worried About Charity fundraiser in the Last Year: Not on file  . Ran Out of Food in the Last Year: Not on file  Transportation Needs:   . Lack of Transportation (Medical): Not on file  . Lack of Transportation (Non-Medical): Not on file  Physical Activity:   . Days of Exercise per Week: Not on file  . Minutes of Exercise per Session: Not on file  Stress:   . Feeling of Stress : Not on file  Social Connections:   . Frequency of Communication with Friends and Family: Not on file  . Frequency of Social Gatherings with Friends and Family: Not on file  . Attends Religious Services: Not on file  . Active Member of Clubs or Organizations: Not on file  . Attends Archivist Meetings: Not on file  . Marital Status: Not on file  Intimate Partner Violence:   . Fear of Current or Ex-Partner: Not on file  . Emotionally Abused: Not on file  . Physically Abused: Not on file  . Sexually Abused: Not on file    Family History  Problem Relation Age of Onset  . Aneurysm Father   . Heart disease Father   . Varicose Veins Mother    . Arthritis Mother   . Macular degeneration Mother   . Cancer Neg Hx   . Colon cancer Neg Hx   . Esophageal cancer Neg Hx   . Rectal cancer Neg Hx   . Stomach cancer Neg Hx     ROS- All systems are reviewed and negative except as per the HPI above  Physical Exam: Vitals:   06/15/19 1145  BP: 110/70  Pulse: 77  Weight: 134.9 kg  Height: _0  (1.956 m)   Wt Readings from Last 3 Encounters:  06/15/19 134.9 kg  05/29/19 134.1 kg  05/29/19 133.8 kg    Labs: Lab Results  Component Value Date   NA 139 05/29/2019   K 4.6 05/29/2019   CL 105 05/29/2019   CO2 25 05/29/2019   GLUCOSE 167 (H) 05/29/2019   BUN 22 05/29/2019   CREATININE 1.09 05/29/2019   CALCIUM 9.5 05/29/2019   No results found for: INR Lab Results  Component Value Date   CHOL 148 05/29/2019   HDL 34.00 (L) 05/29/2019   LDLCALC 48 06/01/2018   TRIG 255.0 (H) 05/29/2019     GEN- The patient is well appearing, alert and oriented x 3 today.   Head- normocephalic, atraumatic Eyes-  Sclera clear, conjunctiva pink Ears- hearing intact Oropharynx- clear Neck- supple, no JVP Lymph- no cervical lymphadenopathy Lungs- Clear to ausculation bilaterally, normal work of breathing Heart- irregular rate and rhythm, no murmurs, rubs or gallops, PMI not laterally displaced GI- soft, NT, ND, + BS Extremities- no clubbing, cyanosis, or edema MS- no significant deformity or atrophy Skin- no rash or lesion Psych- euthymic mood, full affect Neuro- strength and sensation are intact  EKG- afib at 77 bpm, qrs int 98 ms, qtc 444 ms    Assessment and Plan: 1. Persistent  afib Abaltion 04/07/19 Zio patch confirmed persistent afib  Discussed with Dr. Curt Bears and he agrees it will probably take AAD plus cardioversion to restore SR  amio vrs Tikosyn discussed Pt has been loading on amioadorne 200 mg bid since  12/14 He decided to go with amiodarone even though it did not work well for him in the past( but often works  better s/p ablation)  Will plan on cardioversion in 2 weeks   2. CHA2DS2VASc of at least 4 Continue xarelto 20 mg daily No missed doses x 3 weeks (had one delayed dose by less than 12 hours, 2 nights ago, per PharmD, not considered  a missed dose  3. HTN Stable   DCCV set up for 1/13 with covid test 1/10 Cbc/bmet today He will stay on amiodarone 200 mg bid until I see him back in clinic one week after cardioversion  Butch Penny C. , [facility] [address] [place], [place] [address] [phone]

## 2019-06-15 NOTE — Addendum Note (Signed)
Encounter addended by: Hinda Kehr, CMA on: 06/15/2019 3:24 PM  Actions taken: Visit diagnoses modified, Order list changed, Diagnosis association updated

## 2019-06-15 NOTE — Progress Notes (Signed)
Primary Care Physician: [name], MD Referring Physician: Dr. Curt Bears  Cardiologist: Dr. [name]    [name] is a 73 y.o. male with a h/o of DM II, HTN, HLD, TAA, morbid obesity, and persistent A. Fib, EF of 39%,  DX with afib December 2019 and started on xarelto.CHA2DS2VASc of at least 4. DCCV 07/12/18 with ERAF. He was loaded on amiodarone with second DCCV unsuccessful 07/2018.   He recently had ablation one month ago  with Dr. Curt Bears and is back in clinic for f/u. He is in afib today. He feels it may be paroxysmal but he does not have a good way  to track it.  He feels his afib is more tolerable since ablation, but will feel it  several times a day but he was unsure. .   No swallowing or groin issues.   F/u in afib clinic 05/29/19. Zio patch placed  showed persistent afib. I discussed with Dr. Curt Bears and he feels he may need AAD plus DCCV to try to restore/maintain   Rhythm post ablation. Pt has used amiodarone in the past and is willing to restart this drug. Dicussed tikosyn but pt's wife needs supervision and he would not be able to come into the hospital for drug.   F/u in afib clinic, 06/15/19. He is in rate controlled afib and tolerating amiodarone well. Fatigue biggest compliant. Will go ahead and plan on DCCV. He had a delayed dose of xarelto (lest than 12 hours, checked with PharmD not considered a missed dose ) 2 nights ago.   Today, he denies symptoms of palpitations, chest pain, shortness of breath, orthopnea, PND, lower extremity edema, dizziness, presyncope, syncope, or neurologic sequela. The patient is tolerating medications without difficulties and is otherwise without complaint today.   Past Medical History:  Diagnosis Date  . Arthritis   . BPH (benign prostatic hyperplasia)   . Diabetes mellitus type 2 in obese (HCC)    diet controlled  . HTN (hypertension)   . Hyperlipidemia, mixed   . Insomnia   . Obesity   . Thoracic aortic aneurysm, without rupture  (HCC)    4.7 cm per chest ct with contrast 11-04-17   Past Surgical History:  Procedure Laterality Date  . ATRIAL FIBRILLATION ABLATION N/A 04/07/2019   Procedure: ATRIAL FIBRILLATION ABLATION;  Surgeon: [name], MD;  Location: [place] [facility];  Service: Cardiovascular;  Laterality: N/A;  . CARDIOVERSION N/A 07/12/2018   Procedure: CARDIOVERSION;  Surgeon: Thayer Headings, MD;  Location: [facility];  Service: Cardiovascular;  Laterality: N/A;  . CARDIOVERSION N/A 08/22/2018   Procedure: CARDIOVERSION;  Surgeon: Skeet Latch, MD;  Location: [place];  Service: Cardiovascular;  Laterality: N/A;  . COLONOSCOPY  2016  . CYST EXCISION     polynomial  . CYSTOSCOPY WITH INSERTION OF UROLIFT N/A 04/18/2018   Procedure: CYSTOSCOPY WITH INSERTION OF UROLIFT;  Surgeon: Cleon Gustin, MD;  Location: [facility];  Service: Urology;  Laterality: N/A;  . KNEE SURGERY Right 2000   arthroscopy  . RIGHT/LEFT HEART CATH AND CORONARY ANGIOGRAPHY N/A 01/30/2019   Procedure: RIGHT/LEFT HEART CATH AND CORONARY ANGIOGRAPHY;  Surgeon: [name], [name], MD;  Location: [place] [facility];  Service: Cardiovascular;  Laterality: N/A;  . TONSILLECTOMY    . widson teeth extraction      Current Outpatient Medications  Medication Sig Dispense Refill  . alfuzosin (UROXATRAL) 10 MG 24 hr tablet Take 1 tablet (10 mg total) by mouth  daily with breakfast. 90 tablet 1  . amiodarone (PACERONE) 200 MG tablet Take one tablet twice daily for one month then reduce to one tablet daily 60 tablet 0  . atorvastatin (LIPITOR) 20 MG tablet Take 1 tablet (20 mg total) by mouth at bedtime. 90 tablet 0  . Empagliflozin-metFORMIN HCl ER (SYNJARDY XR) 25-1000 MG TB24 Take 1 tablet by mouth daily. 84 tablet 0  . FLUZONE HIGH-DOSE QUADRIVALENT 0.7 ML SUSY TO BE ADMINISTERED BY PHARMACIST FOR IMMUNIZATION    . Glycopyrrolate (LONHALA MAGNAIR REFILL KIT) 25 MCG/ML SOLN Inhale 1 Act into the lungs 2  (two) times daily. 180 mL 1  . Glycopyrrolate (LONHALA MAGNAIR STARTER KIT) 25 MCG/ML SOLN Inhale 60 mLs into the lungs 2 (two) times daily. 60 mL 5  . hydrocortisone cream 1 % Apply 1 application topically daily as needed (rash).    Marland Kitchen losartan (COZAAR) 25 MG tablet TAKE 2 TABLETS BY MOUTH EVERY DAY (Patient taking differently: Take 50 mg by mouth every evening. ) 180 tablet 1  . Menthol-Methyl Salicylate (SALONPAS PAIN RELIEF PATCH EX) Apply 1 patch topically daily as needed (back pain).    . metoprolol succinate (TOPROL-XL) 25 MG 24 hr tablet Take 0.5 tablets (12.5 mg total) by mouth daily. 90 tablet 2  . rivaroxaban (XARELTO) 20 MG TABS tablet Take 1 tablet (20 mg total) by mouth daily with supper.    Marland Kitchen spironolactone (ALDACTONE) 25 MG tablet TAKE 0.5 TABLETS (12.5 MG TOTAL) BY MOUTH AT BEDTIME. 45 tablet 0  . triazolam (HALCION) 0.25 MG tablet Take 1 tablet (0.25 mg total) by mouth at bedtime as needed for sleep. 30 tablet 3   No current facility-administered medications for this encounter.    Allergies  Allergen Reactions  . Quinolones Other (See Comments)    Patient was warned about not using Cipro and similar antibiotics. Recent studies have raised concern that fluoroquinolone antibiotics could be associated with an increased risk of aortic aneurysm Fluoroquinolones have non-antimicrobial properties that might jeopardise the integrity of the extracellular matrix of the vascular wall In a  propensity score matched cohort study in [place], there was a 66% increased rate of aortic aneurysm or dissection associated with oral fluoroquinolone use, compared wit    Social History   Socioeconomic History  . Marital status: Single    Spouse name: Not on file  . Number of children: Not on file  . Years of education: Not on file  . Highest education level: Not on file  Occupational History  . Not on file  Tobacco Use  . Smoking status: Former Smoker    Packs/day: 1.50    Years: 5.00     Pack years: 7.50    Types: Cigarettes    Quit date: 06/15/1994    Years since quitting: 25.0  . Smokeless tobacco: Never Used  Substance and Sexual Activity  . Alcohol use: Yes    Alcohol/week: 14.0 standard drinks    Types: 14 Shots of liquor per week    Comment: 1 drink of liquor per day  . Drug use: Yes    Types: Marijuana    Comment: occasionally marijuana  . Sexual activity: Yes    Partners: Female    Birth control/protection: Condom    Comment: condom use most of the time but not always  Other Topics Concern  . Not on file  [employer] - [employer]. married '72- 3 years/divorced; married '89- 3 years/divorced;. married '03 -  3 years/divorced. 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   Social Determinants of Health   Financial Resource Strain:   . Difficulty of Paying Living Expenses: Not on file  Food Insecurity:   . Worried About Charity fundraiser in the Last Year: Not on file  . Ran Out of Food in the Last Year: Not on file  Transportation Needs:   . Lack of Transportation (Medical): Not on file  . Lack of Transportation (Non-Medical): Not on file  Physical Activity:   . Days of Exercise per Week: Not on file  . Minutes of Exercise per Session: Not on file  Stress:   . Feeling of Stress : Not on file  Social Connections:   . Frequency of Communication with Friends and Family: Not on file  . Frequency of Social Gatherings with Friends and Family: Not on file  . Attends Religious Services: Not on file  . Active Member of Clubs or Organizations: Not on file  . Attends Archivist Meetings: Not on file  . Marital Status: Not on file  Intimate Partner Violence:   . Fear of Current or Ex-Partner: Not on file  . Emotionally Abused: Not on file  . Physically Abused: Not on file  . Sexually Abused: Not on file    Family History  Problem Relation Age of Onset  . Aneurysm Father   . Heart disease Father   . Varicose Veins Mother    . Arthritis Mother   . Macular degeneration Mother   . Cancer Neg Hx   . Colon cancer Neg Hx   . Esophageal cancer Neg Hx   . Rectal cancer Neg Hx   . Stomach cancer Neg Hx     ROS- All systems are reviewed and negative except as per the HPI above  Physical Exam: Vitals:   06/15/19 1145  BP: 110/70  Pulse: 77  Weight: 134.9 kg  Height: _0  (1.956 m)   Wt Readings from Last 3 Encounters:  06/15/19 134.9 kg  05/29/19 134.1 kg  05/29/19 133.8 kg    Labs: Lab Results  Component Value Date   NA 139 05/29/2019   K 4.6 05/29/2019   CL 105 05/29/2019   CO2 25 05/29/2019   GLUCOSE 167 (H) 05/29/2019   BUN 22 05/29/2019   CREATININE 1.09 05/29/2019   CALCIUM 9.5 05/29/2019   No results found for: INR Lab Results  Component Value Date   CHOL 148 05/29/2019   HDL 34.00 (L) 05/29/2019   LDLCALC 48 06/01/2018   TRIG 255.0 (H) 05/29/2019     GEN- The patient is well appearing, alert and oriented x 3 today.   Head- normocephalic, atraumatic Eyes-  Sclera clear, conjunctiva pink Ears- hearing intact Oropharynx- clear Neck- supple, no JVP Lymph- no cervical lymphadenopathy Lungs- Clear to ausculation bilaterally, normal work of breathing Heart- irregular rate and rhythm, no murmurs, rubs or gallops, PMI not laterally displaced GI- soft, NT, ND, + BS Extremities- no clubbing, cyanosis, or edema MS- no significant deformity or atrophy Skin- no rash or lesion Psych- euthymic mood, full affect Neuro- strength and sensation are intact  EKG- afib at 77 bpm, qrs int 98 ms, qtc 444 ms    Assessment and Plan: 1. Persistent  afib Abaltion 04/07/19 Zio patch confirmed persistent afib  Discussed with Dr. Curt Bears and he agrees it will probably take AAD plus cardioversion to restore SR  amio vrs Tikosyn discussed Pt has been loading on amioadorne 200 mg bid since  12/14 He decided to go with amiodarone even though it did not work well for him in the past( but often works  better s/p ablation)  Will plan on cardioversion in 2 weeks   2. CHA2DS2VASc of at least 4 Continue xarelto 20 mg daily No missed doses x 3 weeks (had one delayed dose by less than 12 hours, 2 nights ago, per PharmD, not considered  a missed dose  3. HTN Stable   DCCV set up for 1/13 with covid test 1/10 Cbc/bmet today He will stay on amiodarone 200 mg bid until I see him back in clinic one week after cardioversion  Clinton Penny C. , [facility] [address] [place], [place] [address] [phone]

## 2019-06-19 DIAGNOSIS — R972 Elevated prostate specific antigen [PSA]: Secondary | ICD-10-CM | POA: Diagnosis not present

## 2019-06-21 ENCOUNTER — Other Ambulatory Visit (HOSPITAL_COMMUNITY): Payer: Self-pay | Admitting: Nurse Practitioner

## 2019-06-21 NOTE — Telephone Encounter (Signed)
Pt has not responded to our attempts to reach him. Closing note.

## 2019-06-24 ENCOUNTER — Other Ambulatory Visit (HOSPITAL_COMMUNITY)
Admission: RE | Admit: 2019-06-24 | Discharge: 2019-06-24 | Disposition: A | Discharge status: Home / Self Care | Payer: Medicare HMO | Source: Ambulatory Visit | Attending: Cardiovascular Disease | Admitting: Cardiovascular Disease

## 2019-06-24 DIAGNOSIS — Z01812 Encounter for preprocedural laboratory examination: Secondary | ICD-10-CM | POA: Diagnosis not present

## 2019-06-24 DIAGNOSIS — Z20822 Contact with and (suspected) exposure to covid-19: Secondary | ICD-10-CM | POA: Diagnosis not present

## 2019-06-25 LAB — NOVEL CORONAVIRUS, NAA (HOSP ORDER, SEND-OUT TO REF LAB; TAT 18-24 HRS): SARS-CoV-2, NAA: NOT DETECTED

## 2019-06-26 ENCOUNTER — Telehealth: Payer: Self-pay | Admitting: Internal Medicine

## 2019-06-26 ENCOUNTER — Other Ambulatory Visit: Payer: Self-pay | Admitting: Internal Medicine

## 2019-06-26 DIAGNOSIS — J411 Mucopurulent chronic bronchitis: Secondary | ICD-10-CM

## 2019-06-26 MED ORDER — YUPELRI 175 MCG/3ML IN SOLN: 1.0000 | Freq: Every day | RESPIRATORY_TRACT | 5 refills | Status: DC

## 2019-06-26 NOTE — Telephone Encounter (Signed)
[facility] called in again stating pt reached out to them 1/8 and stated he would like to proceed with getting this medication. Please advise. PA is [phone] or via covermymeds with key code [id number] for starter kit, and refill code is [id number]. Please advise.

## 2019-06-26 NOTE — Telephone Encounter (Signed)
Copied from [place] [id number]. Topic: General - Other >> Jun 26, 2019  1:44 PM Keene Breath wrote: Reason for CRM: Called to inform the doctor that the patient cannot afford the medication, Revefenacin (YUPELRI) 175 MCG/3ML SOLN, and would like an alternative.  Please advise and call back at [phone]

## 2019-06-27 ENCOUNTER — Other Ambulatory Visit: Payer: Self-pay | Admitting: Cardiothoracic Surgery

## 2019-06-27 DIAGNOSIS — I712 Thoracic aortic aneurysm, without rupture, unspecified: Secondary | ICD-10-CM

## 2019-06-27 NOTE — Telephone Encounter (Signed)
PA was started by pharmacy and denied.

## 2019-06-27 NOTE — Telephone Encounter (Signed)
Cherelle calling from [employer] called and would like to know if we are going to appeal. Please advise    [phone]

## 2019-06-27 NOTE — Telephone Encounter (Signed)
Spoke to [place], informed that we are not appealing at this time.    Contacted pt to confirm who his insurance was through. Pt stated that he kept [employer] for the 2021 formulary year.

## 2019-06-27 NOTE — Anesthesia Preprocedure Evaluation (Addendum)
Anesthesia Evaluation  Patient identified by MRN, date of birth, ID band Patient awake    Reviewed: Allergy & Precautions, H&P , NPO status , Patient's Chart, lab work & pertinent test results  Airway Mallampati: II  TM Distance: >3 FB Neck ROM: Full    Dental no notable dental hx. (+) Teeth Intact, Dental Advisory Given   Pulmonary neg pulmonary ROS, former smoker,    Pulmonary exam normal breath sounds clear to auscultation       Cardiovascular Exercise Tolerance: Good hypertension, Pt. on medications and Pt. on home beta blockers + dysrhythmias Atrial Fibrillation  Rhythm:Irregular Rate:Normal     Neuro/Psych negative neurological ROS  negative psych ROS   GI/Hepatic negative GI ROS, Neg liver ROS,   Endo/Other  diabetesMorbid obesity  Renal/GU negative Renal ROS  negative genitourinary   Musculoskeletal  (+) Arthritis , Osteoarthritis,    Abdominal   Peds  Hematology negative hematology ROS (+)   Anesthesia Other Findings   Reproductive/Obstetrics negative OB ROS                            Anesthesia Physical Anesthesia Plan  ASA: III  Anesthesia Plan: General   Post-op Pain Management:    Induction: Intravenous  PONV Risk Score and Plan: 2 and Treatment may vary due to age or medical condition  Airway Management Planned: Mask  Additional Equipment:   Intra-op Plan:   Post-operative Plan:   Informed Consent: I have reviewed the patients History and Physical, chart, labs and discussed the procedure including the risks, benefits and alternatives for the proposed anesthesia with the patient or authorized representative who has indicated his/her understanding and acceptance.     Dental advisory given  Plan Discussed with: CRNA  Anesthesia Plan Comments:         Anesthesia Quick Evaluation

## 2019-06-27 NOTE — Telephone Encounter (Signed)
The PA for the [place] was denied.   Pt states that the [place]ta Bees is too expensive.   Would you like me pull up pt formulary?

## 2019-06-28 ENCOUNTER — Ambulatory Visit (HOSPITAL_COMMUNITY)
Admission: RE | Admit: 2019-06-28 | Discharge: 2019-06-28 | Disposition: A | Discharge status: Home / Self Care | Payer: Medicare HMO | Source: Ambulatory Visit | Attending: Cardiovascular Disease | Admitting: Cardiovascular Disease

## 2019-06-28 ENCOUNTER — Other Ambulatory Visit: Payer: Self-pay

## 2019-06-28 ENCOUNTER — Encounter (HOSPITAL_COMMUNITY)
Admission: RE | Disposition: A | Discharge status: Home / Self Care | Payer: Self-pay | Source: Ambulatory Visit | Attending: Cardiovascular Disease

## 2019-06-28 ENCOUNTER — Encounter (HOSPITAL_COMMUNITY): Payer: Self-pay | Admitting: Cardiovascular Disease

## 2019-06-28 ENCOUNTER — Ambulatory Visit (HOSPITAL_COMMUNITY): Payer: Medicare HMO | Admitting: Anesthesiology

## 2019-06-28 DIAGNOSIS — M1990 Unspecified osteoarthritis, unspecified site: Secondary | ICD-10-CM | POA: Diagnosis not present

## 2019-06-28 DIAGNOSIS — E119 Type 2 diabetes mellitus without complications: Secondary | ICD-10-CM | POA: Insufficient documentation

## 2019-06-28 DIAGNOSIS — Z6834 Body mass index (BMI) 34.0-34.9, adult: Secondary | ICD-10-CM | POA: Diagnosis not present

## 2019-06-28 DIAGNOSIS — I712 Thoracic aortic aneurysm, without rupture: Secondary | ICD-10-CM | POA: Insufficient documentation

## 2019-06-28 DIAGNOSIS — N400 Enlarged prostate without lower urinary tract symptoms: Secondary | ICD-10-CM | POA: Diagnosis not present

## 2019-06-28 DIAGNOSIS — I10 Essential (primary) hypertension: Secondary | ICD-10-CM | POA: Diagnosis not present

## 2019-06-28 DIAGNOSIS — E785 Hyperlipidemia, unspecified: Secondary | ICD-10-CM | POA: Diagnosis not present

## 2019-06-28 DIAGNOSIS — I4819 Other persistent atrial fibrillation: Secondary | ICD-10-CM | POA: Diagnosis not present

## 2019-06-28 DIAGNOSIS — Z79899 Other long term (current) drug therapy: Secondary | ICD-10-CM | POA: Insufficient documentation

## 2019-06-28 DIAGNOSIS — E782 Mixed hyperlipidemia: Secondary | ICD-10-CM | POA: Diagnosis not present

## 2019-06-28 DIAGNOSIS — Z7901 Long term (current) use of anticoagulants: Secondary | ICD-10-CM | POA: Diagnosis not present

## 2019-06-28 DIAGNOSIS — I4891 Unspecified atrial fibrillation: Secondary | ICD-10-CM | POA: Diagnosis not present

## 2019-06-28 HISTORY — PX: CARDIOVERSION: SHX1299

## 2019-06-28 LAB — POCT I-STAT, CHEM 8
BUN: 22 mg/dL (ref 8–23)
Calcium, Ion: 1.19 mmol/L (ref 1.15–1.40)
Chloride: 108 mmol/L (ref 98–111)
Creatinine, Ser: 1 mg/dL (ref 0.61–1.24)
Glucose, Bld: 150 mg/dL — ABNORMAL HIGH (ref 70–99)
HCT: 48 % (ref 39.0–52.0)
Hemoglobin: 16.3 g/dL (ref 13.0–17.0)
Potassium: 4.4 mmol/L (ref 3.5–5.1)
Sodium: 139 mmol/L (ref 135–145)
TCO2: 24 mmol/L (ref 22–32)

## 2019-06-28 SURGERY — CARDIOVERSION
Anesthesia: General

## 2019-06-28 MED ORDER — SODIUM CHLORIDE 0.9 % IV SOLN: INTRAVENOUS | Status: DC

## 2019-06-28 MED ORDER — SODIUM CHLORIDE 0.9 % IV SOLN: INTRAVENOUS | Status: DC | PRN

## 2019-06-28 MED ORDER — PROPOFOL 10 MG/ML IV BOLUS
INTRAVENOUS | Status: DC | PRN
  Administered 2019-06-28: 100 ug via INTRAVENOUS

## 2019-06-28 MED ORDER — LIDOCAINE 2% (20 MG/ML) 5 ML SYRINGE
INTRAMUSCULAR | Status: DC | PRN
  Administered 2019-06-28: 60 mg via INTRAVENOUS

## 2019-06-28 MED ORDER — PHENYLEPHRINE 40 MCG/ML (10ML) SYRINGE FOR IV PUSH (FOR BLOOD PRESSURE SUPPORT)
PREFILLED_SYRINGE | INTRAVENOUS | Status: DC | PRN
  Administered 2019-06-28: 80 ug via INTRAVENOUS

## 2019-06-28 NOTE — Telephone Encounter (Signed)
Formulary given to PCP for review.

## 2019-06-28 NOTE — Interval H&P Note (Signed)
History and Physical Interval Note:  06/28/2019 8:52 AM  Clinton Black  has presented today for surgery, with the diagnosis of AFIB.  The various methods of treatment have been discussed with the patient and family. After consideration of risks, benefits and other options for treatment, the patient has consented to  Procedure(s): CARDIOVERSION (N/A) as a surgical intervention.  The patient's history has been reviewed, patient examined, no change in status, stable for surgery.  I have reviewed the patient's chart and labs.  Questions were answered to the patient's satisfaction.     Mertie Moores

## 2019-06-28 NOTE — Discharge Instructions (Signed)
Electrical Cardioversion Electrical cardioversion is the delivery of a jolt of electricity to restore a normal rhythm to the heart. A rhythm that is too fast or is not regular keeps the heart from pumping well. In this procedure, sticky patches or metal paddles are placed on the chest to deliver electricity to the heart from a device. This procedure may be done in an emergency if:  There is low or no blood pressure as a result of the heart rhythm.  Normal rhythm must be restored as fast as possible to protect the brain and heart from further damage.  It may save a life. This may also be a scheduled procedure for irregular or fast heart rhythms that are not immediately life-threatening. Tell a health care provider about:  Any allergies you have.  All medicines you are taking, including vitamins, herbs, eye drops, creams, and over-the-counter medicines.  Any problems you or family members have had with anesthetic medicines.  Any blood disorders you have.  Any surgeries you have had.  Any medical conditions you have.  Whether you are pregnant or may be pregnant. What are the risks? Generally, this is a safe procedure. However, problems may occur, including:  Allergic reactions to medicines.  A blood clot that breaks free and travels to other parts of your body.  The possible return of an abnormal heart rhythm within hours or days after the procedure.  Your heart stopping (cardiac arrest). This is rare. What happens before the procedure? Medicines  Your health care provider may have you start taking: ? Blood-thinning medicines (anticoagulants) so your blood does not clot as easily. ? Medicines to help stabilize your heart rate and rhythm.  Ask your health care provider about: ? Changing or stopping your regular medicines. This is especially important if you are taking diabetes medicines or blood thinners. ? Taking medicines such as aspirin and ibuprofen. These medicines can  thin your blood. Do not take these medicines unless your health care provider tells you to take them. ? Taking over-the-counter medicines, vitamins, herbs, and supplements. General instructions  Follow instructions from your health care provider about eating or drinking restrictions.  Plan to have someone take you home from the hospital or clinic.  If you will be going home right after the procedure, plan to have someone with you for 24 hours.  Ask your health care provider what steps will be taken to help prevent infection. These may include washing your skin with a germ-killing soap. What happens during the procedure?   An IV will be inserted into one of your veins.  Sticky patches (electrodes) or metal paddles may be placed on your chest.  You will be given a medicine to help you relax (sedative).  An electrical shock will be delivered. The procedure may vary among health care providers and hospitals. What can I expect after the procedure?  Your blood pressure, heart rate, breathing rate, and blood oxygen level will be monitored until you leave the hospital or clinic.  Your heart rhythm will be watched to make sure it does not change.  You may have some redness on the skin where the shocks were given. Follow these instructions at home:  Do not drive for 24 hours if you were given a sedative during your procedure.  Take over-the-counter and prescription medicines only as told by your health care provider.  Ask your health care provider how to check your pulse. Check it often.  Rest for 48 hours after the procedure or   as told by your health care provider.  Avoid or limit your caffeine use as told by your health care provider.  Keep all follow-up visits as told by your health care provider. This is important. Contact a health care provider if:  You feel like your heart is beating too quickly or your pulse is not regular.  You have a serious muscle cramp that does not go  away. Get help right away if:  You have discomfort in your chest.  You are dizzy or you feel faint.  You have trouble breathing or you are short of breath.  Your speech is slurred.  You have trouble moving an arm or leg on one side of your body.  Your fingers or toes turn cold or blue. Summary  Electrical cardioversion is the delivery of a jolt of electricity to restore a normal rhythm to the heart.  This procedure may be done right away in an emergency or may be a scheduled procedure if the condition is not an emergency.  Generally, this is a safe procedure.  After the procedure, check your pulse often as told by your health care provider. This information is not intended to replace advice given to you by your health care provider. Make sure you discuss any questions you have with your health care provider. Document Revised: 01/02/2019 Document Reviewed: 01/02/2019 Elsevier Patient Education  2020 [employer]  

## 2019-06-28 NOTE — Anesthesia Procedure Notes (Signed)
Procedure Name: MAC Date/Time: 06/28/2019 9:14 AM Performed by: Teressa Lower., CRNA Pre-anesthesia Checklist: Patient identified, Emergency Drugs available, Suction available, Patient being monitored and Timeout performed Patient Re-evaluated:Patient Re-evaluated prior to induction Oxygen Delivery Method: Nasal cannula and Ambu bag Preoxygenation: Pre-oxygenation with 100% oxygen Ventilation: Oral airway inserted - appropriate to patient size

## 2019-06-28 NOTE — CV Procedure (Signed)
    Cardioversion Note  MARIE CORREA [id number] 1945-11-06  Procedure: DC Cardioversion Indications: atrial fib   Procedure Details Consent: Obtained Time Out: Verified patient identification, verified procedure, site/side was marked, verified correct patient position, special equipment/implants available, Radiology Safety Procedures followed,  medications/allergies/relevent history reviewed, required imaging and test results available.  Performed  The patient has been on adequate anticoagulation.  The patient received IV Lidocaine 60 mg IV followed by Propofol 100 mg IV  for sedation.  Synchronous cardioversion was performed at  200, 200 joules while holding pressure anterior pressure   The cardioversion was successful     Complications: No apparent complications Patient did tolerate procedure well.   Thayer Headings, Brooke Bonito., MD, [facility] 06/28/2019, 9:12 AM

## 2019-06-28 NOTE — Anesthesia Postprocedure Evaluation (Signed)
Anesthesia Post Note  Patient: Clinton Black  Procedure(s) Performed: CARDIOVERSION (N/A )     Patient location during evaluation: Endoscopy Anesthesia Type: General Level of consciousness: awake and alert Pain management: pain level controlled Vital Signs Assessment: post-procedure vital signs reviewed and stable Respiratory status: spontaneous breathing, nonlabored ventilation and respiratory function stable Cardiovascular status: blood pressure returned to baseline and stable Postop Assessment: no apparent nausea or vomiting Anesthetic complications: no    Last Vitals:  Vitals:   06/28/19 0930 06/28/19 0940  BP: (!) 94/32 (!) 101/40  Pulse: (!) 52 (!) 54  Resp: 18 17  Temp:    SpO2: 97% 97%    Last Pain:  Vitals:   06/28/19 0921  TempSrc:   PainSc: 0-No pain                 ,W. EDMOND

## 2019-06-28 NOTE — Transfer of Care (Signed)
Immediate Anesthesia Transfer of Care Note  Patient: Clinton Black  Procedure(s) Performed: CARDIOVERSION (N/A )  Patient Location: Endoscopy Unit  Anesthesia Type:MAC  Level of Consciousness: awake, alert  and oriented  Airway & Oxygen Therapy: Patient Spontanous Breathing and Patient connected to nasal cannula oxygen  Post-op Assessment: Report given to RN and Post -op Vital signs reviewed and stable  Post vital signs: Reviewed and stable  Last Vitals:  Vitals Value Taken Time  BP    Temp    Pulse    Resp    SpO2      Last Pain:  Vitals:   06/28/19 0817  TempSrc: Oral  PainSc: 0-No pain         Complications: No apparent anesthesia complications

## 2019-06-29 ENCOUNTER — Encounter: Payer: Self-pay | Admitting: *Deleted

## 2019-06-29 ENCOUNTER — Other Ambulatory Visit: Payer: Self-pay | Admitting: Internal Medicine

## 2019-06-29 DIAGNOSIS — J411 Mucopurulent chronic bronchitis: Secondary | ICD-10-CM

## 2019-06-29 MED ORDER — SPIRIVA RESPIMAT 1.25 MCG/ACT IN AERS: 2.0000 | INHALATION_SPRAY | Freq: Every day | RESPIRATORY_TRACT | 1 refills | Status: DC

## 2019-06-29 NOTE — Telephone Encounter (Signed)
Per PCP - rx has been changed to Spiriva. This has been sent to the [facility] [place].

## 2019-06-29 NOTE — Telephone Encounter (Signed)
Contacted pt, informed him of rx change per PCP and verified pharmacy. He verbalized understanding.

## 2019-07-05 ENCOUNTER — Other Ambulatory Visit: Payer: Self-pay

## 2019-07-05 ENCOUNTER — Ambulatory Visit (HOSPITAL_COMMUNITY)
Admission: RE | Admit: 2019-07-05 | Discharge: 2019-07-05 | Disposition: A | Discharge status: Home / Self Care | Payer: Medicare HMO | Source: Ambulatory Visit | Attending: Nurse Practitioner | Admitting: Nurse Practitioner

## 2019-07-05 VITALS — BP 108/52 | HR 63 | Ht 77.0 in | Wt 295.0 lb

## 2019-07-05 DIAGNOSIS — R9431 Abnormal electrocardiogram [ECG] [EKG]: Secondary | ICD-10-CM | POA: Insufficient documentation

## 2019-07-05 DIAGNOSIS — Z6834 Body mass index (BMI) 34.0-34.9, adult: Secondary | ICD-10-CM | POA: Diagnosis not present

## 2019-07-05 DIAGNOSIS — D6869 Other thrombophilia: Secondary | ICD-10-CM | POA: Diagnosis not present

## 2019-07-05 DIAGNOSIS — I712 Thoracic aortic aneurysm, without rupture: Secondary | ICD-10-CM | POA: Diagnosis not present

## 2019-07-05 DIAGNOSIS — Z8261 Family history of arthritis: Secondary | ICD-10-CM | POA: Diagnosis not present

## 2019-07-05 DIAGNOSIS — Z7901 Long term (current) use of anticoagulants: Secondary | ICD-10-CM | POA: Insufficient documentation

## 2019-07-05 DIAGNOSIS — E1169 Type 2 diabetes mellitus with other specified complication: Secondary | ICD-10-CM | POA: Insufficient documentation

## 2019-07-05 DIAGNOSIS — I4819 Other persistent atrial fibrillation: Secondary | ICD-10-CM

## 2019-07-05 DIAGNOSIS — N400 Enlarged prostate without lower urinary tract symptoms: Secondary | ICD-10-CM | POA: Insufficient documentation

## 2019-07-05 DIAGNOSIS — Z881 Allergy status to other antibiotic agents status: Secondary | ICD-10-CM | POA: Insufficient documentation

## 2019-07-05 DIAGNOSIS — Z7984 Long term (current) use of oral hypoglycemic drugs: Secondary | ICD-10-CM | POA: Diagnosis not present

## 2019-07-05 DIAGNOSIS — M1990 Unspecified osteoarthritis, unspecified site: Secondary | ICD-10-CM | POA: Insufficient documentation

## 2019-07-05 DIAGNOSIS — Z87891 Personal history of nicotine dependence: Secondary | ICD-10-CM | POA: Insufficient documentation

## 2019-07-05 DIAGNOSIS — Z8249 Family history of ischemic heart disease and other diseases of the circulatory system: Secondary | ICD-10-CM | POA: Diagnosis not present

## 2019-07-05 DIAGNOSIS — Z79899 Other long term (current) drug therapy: Secondary | ICD-10-CM | POA: Diagnosis not present

## 2019-07-05 DIAGNOSIS — G4700 Insomnia, unspecified: Secondary | ICD-10-CM | POA: Diagnosis not present

## 2019-07-05 DIAGNOSIS — I10 Essential (primary) hypertension: Secondary | ICD-10-CM | POA: Diagnosis not present

## 2019-07-05 DIAGNOSIS — E782 Mixed hyperlipidemia: Secondary | ICD-10-CM | POA: Diagnosis not present

## 2019-07-05 MED ORDER — AMIODARONE HCL 200 MG PO TABS: 200.0000 mg | ORAL_TABLET | Freq: Every day | ORAL | 0 refills | Status: DC

## 2019-07-05 NOTE — Addendum Note (Signed)
Encounter addended by: Enid Derry, CMA on: 07/05/2019 12:16 PM  Actions taken: Order list changed

## 2019-07-05 NOTE — Progress Notes (Addendum)
Primary Care Physician: [name], MD Referring Physician: Dr. Curt Bears  Cardiologist: Dr. [name]    Clinton Black is a 74 y.o. male with a h/o of DM II, HTN, HLD, TAA, morbid obesity, and persistent A. Fib, EF of 39%,  DX with afib December 2019 and started on xarelto.CHA2DS2VASc of at least 4. DCCV 07/12/18 with ERAF. He was loaded on amiodarone with second DCCV unsuccessful 07/2018.   He recently had ablation one month ago  with Dr. Curt Bears and is back in clinic for f/u. He is in afib today. He feels it may be paroxysmal but he does not have a good way  to track it.  He feels his afib is more tolerable since ablation, but will feel it  several times a day but he was unsure. .   No swallowing or groin issues.   F/u in afib clinic 05/29/19. Zio patch placed  showed persistent afib. I discussed with Dr. Curt Bears and he feels he may need AAD plus DCCV to try to restore/maintain   Rhythm post ablation. Pt has used amiodarone in the past and is willing to restart this drug. Dicussed tikosyn but pt's wife needs supervision and he would not be able to come into the hospital for drug.   F/u in afib clinic, 06/15/19. He is in rate controlled afib and tolerating amiodarone well. Fatigue biggest compliant. Will go ahead and plan on DCCV. He had a delayed dose of xarelto (lest than 12 hours, checked with PharmD not considered a missed dose ) 2 nights ago.   F/u in afib  clinic, 07/05/19. He had successful cardioversion 12/31 and still in SR. He is now on amiodarone 200 mg q day.   Today, he denies symptoms of palpitations, chest pain, shortness of breath, orthopnea, PND, lower extremity edema, dizziness, presyncope, syncope, or neurologic sequela. The patient is tolerating medications without difficulties and is otherwise without complaint today.   Past Medical History:  Diagnosis Date  . Arthritis   . BPH (benign prostatic hyperplasia)   . Diabetes mellitus type 2 in obese (HCC)    diet  controlled  . HTN (hypertension)   . Hyperlipidemia, mixed   . Insomnia   . Obesity   . Thoracic aortic aneurysm, without rupture (HCC)    4.7 cm per chest ct with contrast 11-04-17   Past Surgical History:  Procedure Laterality Date  . ATRIAL FIBRILLATION ABLATION N/A 04/07/2019   Procedure: ATRIAL FIBRILLATION ABLATION;  Surgeon: [name], MD;  Location: [facility];  Service: Cardiovascular;  Laterality: N/A;  . CARDIOVERSION N/A 07/12/2018   Procedure: CARDIOVERSION;  Surgeon: Thayer Headings, MD;  Location: [facility] ENDOSCOPY;  Service: Cardiovascular;  Laterality: N/A;  . CARDIOVERSION N/A 08/22/2018   Procedure: CARDIOVERSION;  Surgeon: Skeet Latch, MD;  Location: [facility];  Service: Cardiovascular;  Laterality: N/A;  . CARDIOVERSION N/A 06/28/2019   Procedure: CARDIOVERSION;  Surgeon: Thayer Headings, MD;  Location: [facility] ENDOSCOPY;  Service: Cardiovascular;  Laterality: N/A;  . COLONOSCOPY  2016  . CYST EXCISION     polynomial  . CYSTOSCOPY WITH INSERTION OF UROLIFT N/A 04/18/2018   Procedure: CYSTOSCOPY WITH INSERTION OF UROLIFT;  Surgeon: Cleon Gustin, MD;  Location: [facility];  Service: Urology;  Laterality: N/A;  . KNEE SURGERY Right 2000   arthroscopy  . RIGHT/LEFT HEART CATH AND CORONARY ANGIOGRAPHY N/A 01/30/2019   Procedure: RIGHT/LEFT HEART CATH AND CORONARY ANGIOGRAPHY;  Surgeon: [name], [name], MD;  Location: [facility];  Service: Cardiovascular;  Laterality: N/A;  . TONSILLECTOMY    . widson teeth extraction      Current Outpatient Medications  Medication Sig Dispense Refill  . alfuzosin (UROXATRAL) 10 MG 24 hr tablet Take 1 tablet (10 mg total) by mouth daily with breakfast. 90 tablet 1  . amiodarone (PACERONE) 200 MG tablet TAKE ONE TABLET TWICE DAILY FOR ONE MONTH THEN REDUCE TO ONE TABLET DAILY (Patient taking differently: Take 200 mg by mouth See admin instructions. Take one tablet twice daily for one month  then reduce to one tablet daily) 60 tablet 0  . APPLE CIDER VINEGAR PO Take 2 tablets by mouth daily.    Marland Kitchen atorvastatin (LIPITOR) 20 MG tablet Take 1 tablet (20 mg total) by mouth at bedtime. 90 tablet 0  . Empagliflozin-metFORMIN HCl ER (SYNJARDY XR) 25-1000 MG TB24 Take 1 tablet by mouth daily. 84 tablet 0  . GARLIC PO Take 2 capsules by mouth daily.    . hydrocortisone cream 1 % Apply 1 application topically daily as needed (rash).    Marland Kitchen losartan (COZAAR) 25 MG tablet TAKE 2 TABLETS BY MOUTH EVERY DAY (Patient taking differently: Take 50 mg by mouth every evening. ) 180 tablet 1  . Menthol-Methyl Salicylate (SALONPAS PAIN RELIEF PATCH EX) Apply 1 patch topically daily as needed (back pain).    . metoprolol succinate (TOPROL-XL) 25 MG 24 hr tablet Take 0.5 tablets (12.5 mg total) by mouth daily. 90 tablet 2  . Revefenacin (YUPELRI) 175 MCG/3ML SOLN Inhale 1 puff into the lungs daily. (Patient taking differently: Inhale 2 puffs into the lungs daily. ) 90 mL 5  . rivaroxaban (XARELTO) 20 MG TABS tablet Take 1 tablet (20 mg total) by mouth daily with supper.    Marland Kitchen spironolactone (ALDACTONE) 25 MG tablet TAKE 0.5 TABLETS (12.5 MG TOTAL) BY MOUTH AT BEDTIME. 45 tablet 0  . Tiotropium Bromide Monohydrate (SPIRIVA RESPIMAT) 1.25 MCG/ACT AERS Inhale 2 puffs into the lungs daily. 12 g 1  . triazolam (HALCION) 0.25 MG tablet Take 1 tablet (0.25 mg total) by mouth at bedtime as needed for sleep. 30 tablet 3  . TURMERIC PO Take 2 tablets by mouth daily.     No current facility-administered medications for this encounter.    Allergies  Allergen Reactions  . Quinolones Other (See Comments)    Patient was warned about not using Cipro and similar antibiotics. Recent studies have raised concern that fluoroquinolone antibiotics could be associated with an increased risk of aortic aneurysm Fluoroquinolones have non-antimicrobial properties that might jeopardise the integrity of the extracellular matrix of the  vascular wall In a  propensity score matched cohort study in [place], there was a 66% increased rate of aortic aneurysm or dissection associated with oral fluoroquinolone use, compared wit    Social History   Socioeconomic History  . Marital status: Single    Spouse name: Not on file  . Number of children: Not on file  . Years of education: Not on file  . Highest education level: Not on file  Occupational History  . Not on file  Tobacco Use  . Smoking status: Former Smoker    Packs/day: 1.50    Years: 5.00    Pack years: 7.50    Types: Cigarettes    Quit date: 06/15/1994    Years since quitting: 25.0  . Smokeless tobacco: Never Used  Substance and Sexual Activity  . Alcohol use: Yes    Alcohol/week: 14.0  standard drinks    Types: 14 Shots of liquor per week    Comment: 1 drink of liquor per day  . Drug use: Yes    Types: Marijuana    Comment: occasionally marijuana  . Sexual activity: Yes    Partners: Female    Birth control/protection: Condom    Comment: condom use most of the time but not always  Other Topics Concern  . Not on file  [place] [employer] and [employer]. married '72- 3 years/divorced; married '89- 3 years/divorced;. married '03 - 3 years/divorced. 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   Social Determinants of Health   Financial Resource Strain:   . Difficulty of Paying Living Expenses: Not on file  Food Insecurity:   . Worried About Charity fundraiser in the Last Year: Not on file  . Ran Out of Food in the Last Year: Not on file  Transportation Needs:   . Lack of Transportation (Medical): Not on file  . Lack of Transportation (Non-Medical): Not on file  Physical Activity:   . Days of Exercise per Week: Not on file  . Minutes of Exercise per Session: Not on file  Stress:   . Feeling of Stress : Not on file  Social Connections:   . Frequency of Communication with Friends and Family: Not on file  . Frequency of Social Gatherings  with Friends and Family: Not on file  . Attends Religious Services: Not on file  . Active Member of Clubs or Organizations: Not on file  . Attends Archivist Meetings: Not on file  . Marital Status: Not on file  Intimate Partner Violence:   . Fear of Current or Ex-Partner: Not on file  . Emotionally Abused: Not on file  . Physically Abused: Not on file  . Sexually Abused: Not on file    Family History  Problem Relation Age of Onset  . Aneurysm Father   . Heart disease Father   . Varicose Veins Mother   . Arthritis Mother   . Macular degeneration Mother   . Cancer Neg Hx   . Colon cancer Neg Hx   . Esophageal cancer Neg Hx   . Rectal cancer Neg Hx   . Stomach cancer Neg Hx     ROS- All systems are reviewed and negative except as per the HPI above  Physical Exam: Vitals:   07/05/19 1137  BP: (!) 108/52  Pulse: 63  SpO2: 95%  Weight: 133.8 kg  Height: 6\' 5"  (1.956 m)   Wt Readings from Last 3 Encounters:  07/05/19 133.8 kg  06/28/19 133.8 kg  06/15/19 134.9 kg    Labs: Lab Results  Component Value Date   NA 139 06/28/2019   K 4.4 06/28/2019   CL 108 06/28/2019   CO2 25 06/15/2019   GLUCOSE 150 (H) 06/28/2019   BUN 22 06/28/2019   CREATININE 1.00 06/28/2019   CALCIUM 9.2 06/15/2019   No results found for: INR Lab Results  Component Value Date   CHOL 148 05/29/2019   HDL 34.00 (L) 05/29/2019   LDLCALC 48 06/01/2018   TRIG 255.0 (H) 05/29/2019     GEN- The patient is well appearing, alert and oriented x 3 today.   Head- normocephalic, atraumatic Eyes-  Sclera clear, conjunctiva pink Ears- hearing intact Oropharynx- clear Neck- supple, no JVP Lymph- no cervical lymphadenopathy Lungs- Clear to ausculation bilaterally, normal work of breathing Heart- regular rate and rhythm, no murmurs,  rubs or gallops, PMI not laterally displaced GI- soft, NT, ND, + BS Extremities- no clubbing, cyanosis, or edema MS- no significant deformity or  atrophy Skin- no rash or lesion Psych- euthymic mood, full affect Neuro- strength and sensation are intact  EKG- NSR at 63 bpm, PR int 200 ms, qrs int 100 ms, qtc 442 ms Epic records reviewed    Assessment and Plan: 1. Persistent  afib Afib Abaltion 04/07/19 Zio patch confirmed persistent afib  Discussed with Dr. Curt Bears and he agrees it will probably take AAD plus cardioversion to restore SR  amio vrs Tikosyn discussed Pt   loaded on amioadorne 200 mg bid since 05/29/19  He has now had successful DCCV and continues in [place] today  He is now on 200 mg amiodarone daily  2. CHA2DS2VASc of at least 4 Continue xarelto 20 mg daily  3. HTN Stable   F/u with Dr. Curt Bears 07/17/19  Geroge Baseman. , [facility] [address] [place], [place] [address] [phone]

## 2019-07-07 ENCOUNTER — Other Ambulatory Visit: Payer: Self-pay | Admitting: Cardiology

## 2019-07-08 ENCOUNTER — Encounter: Payer: Self-pay | Admitting: Internal Medicine

## 2019-07-16 ENCOUNTER — Other Ambulatory Visit (HOSPITAL_COMMUNITY): Payer: Self-pay | Admitting: Nurse Practitioner

## 2019-07-17 ENCOUNTER — Encounter: Payer: Self-pay | Admitting: Cardiology

## 2019-07-17 ENCOUNTER — Ambulatory Visit (INDEPENDENT_AMBULATORY_CARE_PROVIDER_SITE_OTHER): Payer: Medicare HMO | Admitting: Cardiology

## 2019-07-17 ENCOUNTER — Other Ambulatory Visit: Payer: Self-pay

## 2019-07-17 VITALS — BP 132/64 | HR 68 | Wt 296.0 lb

## 2019-07-17 DIAGNOSIS — I4819 Other persistent atrial fibrillation: Secondary | ICD-10-CM

## 2019-07-17 NOTE — Progress Notes (Signed)
Electrophysiology Office Note   Date:  07/17/2019   ID:  Clinton, Black 07/26/1945, MRN [id number]  PCP:  Clinton Lima, MD  Cardiologist:  Clinton Primary Electrophysiologist:  Clinton Haw, MD    Chief Complaint: AF   History of Present Illness: Clinton Black is a 74 y.o. male who is being seen today for the evaluation of AF at the request of Clinton Black. Presenting today for electrophysiology evaluation.  He has a history of thoracic aortic aneurysm, type 2 diabetes, morbid obesity, hypertension, hyperlipidemia, and atrial fibrillation.  He had a cardioversion on 07/12/2018.  He says that he felt like he went back out of rhythm 1 week later.  While he was in sinus rhythm he had improved energy.  Known with an attempted cardioversion on March 9 which was unsuccessful.  He is currently being evaluated for aortic surgery for a thoracic aortic aneurysm.  He is now status post AF ablation 04/07/2019.  Unfortunately he did go back into atrial fibrillation then noted by cardiac monitoring.  He is currently on amiodarone.  He had his cardioversion 06/28/2019.  Today, denies symptoms of palpitations, chest pain, shortness of breath, orthopnea, PND, lower extremity edema, claudication, dizziness, presyncope, syncope, bleeding, or neurologic sequela. The patient is tolerating medications without difficulties.  Since his cardioversion he is done well.  He is noted no further episodes of atrial fibrillation.  He feels well without much in the way of weakness and fatigue.   Past Medical History:  Diagnosis Date  . Arthritis   . BPH (benign prostatic hyperplasia)   . Diabetes mellitus type 2 in obese (HCC)    diet controlled  . HTN (hypertension)   . Hyperlipidemia, mixed   . Insomnia   . Obesity   . Thoracic aortic aneurysm, without rupture (HCC)    4.7 cm per chest ct with contrast 11-04-17   Past Surgical History:  Procedure Laterality Date  . ATRIAL FIBRILLATION ABLATION N/A  04/07/2019   Procedure: ATRIAL FIBRILLATION ABLATION;  Surgeon: Clinton Haw, MD;  Location: [place] [facility];  Service: Cardiovascular;  Laterality: N/A;  . CARDIOVERSION N/A 07/12/2018   Procedure: CARDIOVERSION;  Surgeon: Thayer Headings, MD;  Location: [facility] ENDOSCOPY;  Service: Cardiovascular;  Laterality: N/A;  . CARDIOVERSION N/A 08/22/2018   Procedure: CARDIOVERSION;  Surgeon: Skeet Latch, MD;  Location: [place];  Service: Cardiovascular;  Laterality: N/A;  . CARDIOVERSION N/A 06/28/2019   Procedure: CARDIOVERSION;  Surgeon: Thayer Headings, MD;  Location: [facility] ENDOSCOPY;  Service: Cardiovascular;  Laterality: N/A;  . COLONOSCOPY  2016  . CYST EXCISION     polynomial  . CYSTOSCOPY WITH INSERTION OF UROLIFT N/A 04/18/2018   Procedure: CYSTOSCOPY WITH INSERTION OF UROLIFT;  Surgeon: Cleon Gustin, MD;  Location: [facility];  Service: Urology;  Laterality: N/A;  . KNEE SURGERY Right 2000   arthroscopy  . RIGHT/LEFT HEART CATH AND CORONARY ANGIOGRAPHY N/A 01/30/2019   Procedure: RIGHT/LEFT HEART CATH AND CORONARY ANGIOGRAPHY;  Surgeon: Clinton, Clinton M, MD;  Location: [place] [facility];  Service: Cardiovascular;  Laterality: N/A;  . TONSILLECTOMY    . widson teeth extraction       Current Outpatient Medications  Medication Sig Dispense Refill  . alfuzosin (UROXATRAL) 10 MG 24 hr tablet Take 1 tablet (10 mg total) by mouth daily with breakfast. 90 tablet 1  . amiodarone (PACERONE) 200 MG tablet Take 1 tablet (200 mg total) by mouth daily. 90 tablet  2  . APPLE CIDER VINEGAR PO Take 2 tablets by mouth daily.    Marland Kitchen atorvastatin (LIPITOR) 20 MG tablet Take 1 tablet (20 mg total) by mouth at bedtime. 90 tablet 0  . Empagliflozin-metFORMIN HCl ER (SYNJARDY XR) 25-1000 MG TB24 Take 1 tablet by mouth daily. 84 tablet 0  . GARLIC PO Take 2 capsules by mouth daily.    . hydrocortisone cream 1 % Apply 1 application topically daily as needed (rash).    Marland Kitchen  losartan (COZAAR) 25 MG tablet TAKE 2 TABLETS BY MOUTH EVERY DAY 180 tablet 1  . Menthol-Methyl Salicylate (SALONPAS PAIN RELIEF PATCH EX) Apply 1 patch topically daily as needed (back pain).    . metoprolol succinate (TOPROL-XL) 25 MG 24 hr tablet TAKE 1 TABLET BY MOUTH EVERY DAY 90 tablet 2  . Revefenacin (YUPELRI) 175 MCG/3ML SOLN Inhale 1 puff into the lungs daily. 90 mL 5  . rivaroxaban (XARELTO) 20 MG TABS tablet Take 1 tablet (20 mg total) by mouth daily with supper.    Marland Kitchen spironolactone (ALDACTONE) 25 MG tablet TAKE 0.5 TABLETS (12.5 MG TOTAL) BY MOUTH AT BEDTIME. 45 tablet 0  . Tiotropium Bromide Monohydrate (SPIRIVA RESPIMAT) 1.25 MCG/ACT AERS Inhale 2 puffs into the lungs daily. 12 g 1  . triazolam (HALCION) 0.25 MG tablet Take 1 tablet (0.25 mg total) by mouth at bedtime as needed for sleep. 30 tablet 3  . TURMERIC PO Take 2 tablets by mouth daily.     No current facility-administered medications for this visit.    Allergies:   Quinolones   Social History:  The patient  reports that he quit smoking about 25 years ago. His smoking use included cigarettes. He has a 7.50 pack-year smoking history. He has never used smokeless tobacco. He reports current alcohol use of about 14.0 standard drinks of alcohol per week. He reports current drug use. Drug: Marijuana.   Family History:  The patient's family history includes Aneurysm in his father; Arthritis in his mother; Heart disease in his father; Macular degeneration in his mother; Varicose Veins in his mother.    ROS:  Please see the history of present illness.   Otherwise, review of systems is positive for none.   All other systems are reviewed and negative.   PHYSICAL EXAM: VS:  BP 132/64   Pulse 68   Wt 296 lb (134.3 kg)   SpO2 96%   BMI 35.10 kg/Black  , BMI Body mass index is 35.1 kg/Black. GEN: Well nourished, well developed, in no acute distress  HEENT: normal  Neck: no JVD, carotid bruits, or masses Cardiac: RRR; no murmurs,  rubs, or gallops,no edema  Respiratory:  clear to auscultation bilaterally, normal work of breathing GI: soft, nontender, nondistended, + BS MS: no deformity or atrophy  Skin: warm and dry Neuro:  Strength and sensation are intact Psych: euthymic mood, full affect  EKG:  EKG is ordered today. Personal review of the ekg ordered shows sinus rhythm, rate 68  Recent Labs: 05/29/2019: Pro B Natriuretic peptide (BNP) 95.0; TSH 4.27 06/15/2019: Platelets 190 06/28/2019: BUN 22; Creatinine, Ser 1.00; Hemoglobin 16.3; Potassium 4.4; Sodium 139    Lipid Panel     Component Value Date/Time   CHOL 148 05/29/2019 1430   CHOL 105 03/11/2017 1541   TRIG 255.0 (H) 05/29/2019 1430   HDL 34.00 (L) 05/29/2019 1430   HDL 36 (L) 03/11/2017 1541   CHOLHDL 4 05/29/2019 1430   VLDL 51.0 (H) 05/29/2019 1430  LDLCALC 48 06/01/2018 1328   LDLCALC 46 03/11/2017 1541   LDLDIRECT 78.0 05/29/2019 1430     Wt Readings from Last 3 Encounters:  07/17/19 296 lb (134.3 kg)  07/05/19 295 lb (133.8 kg)  06/28/19 295 lb (133.8 kg)      Other studies Reviewed: Additional studies/ records that were reviewed today include: TTE 12/28/18  Review of the above records today demonstrates:   1. The left ventricle has normal systolic function with an ejection fraction of 60-65%. The cavity size was normal. There is mildly increased left ventricular wall thickness. Left ventricular diastolic Doppler parameters are consistent with impaired  relaxation.  2. The right ventricle has normal systolic function. The cavity was normal. There is no increase in right ventricular wall thickness.  3. The aortic valve is tricuspid. Mild thickening of the aortic valve. Moderate calcification of the aortic valve. Aortic valve regurgitation is moderate by color flow Doppler.  4. There is dilatation of the ascending aorta measuring 49 mm.  LHC [id number]  LV end diastolic pressure is normal.  There is moderate (3+) aortic  regurgitation.   1. Normal coronary anatomy. Left dominant circulation 2. Normal right heart pressures 3. Normal cardiac output.  4. Moderate aortic insufficiency. No significant stenosis 5. Ascending thoracic aortic aneurysm.   ASSESSMENT AND PLAN:  1.  Persistent atrial fibrillation: Currently on Xarelto.  Status post AF ablation 04/07/2019.  CHA2DS2-VASc of 4.  Unfortunately did have more atrial fibrillation and is now on amiodarone.  He would like to stop his amiodarone in 3 months and see how he does prior to scheduling a repeat ablation.  2.  Thoracic aortic aneurysm: Followed by cardiac surgery  3.  Hypertension: Currently well controlled  4.  Hyperlipidemia: Lipitor per primary cardiology    Current medicines are reviewed at length with the patient today.   The patient does not have concerns regarding his medicines.  The following changes were made today: None  Labs/ tests ordered today include:  Orders Placed This Encounter  Procedures  . EKG 12-Lead     Disposition:   FU with   3 months  Signed,  Meredith Leeds, MD  07/17/2019 2:02 PM     [facility] [address] [place] [address] [phone] (office) [phone] (fax)

## 2019-07-17 NOTE — Patient Instructions (Addendum)
Medication Instructions:    Your physician recommends that you continue on your current medications as directed. Please refer to the Current Medication list given to you today.  - If you need a refill on your cardiac medications before your next appointment, please call your pharmacy.   Labwork:  None ordered  Testing/Procedures:  None ordered  Follow-Up:  Your physician recommends that you schedule a follow-up appointment in: 3 months with Dr. Camnitz.  Thank you for choosing [facility]!!    , RN [phone]         

## 2019-07-18 ENCOUNTER — Encounter: Payer: Self-pay | Admitting: Internal Medicine

## 2019-07-27 ENCOUNTER — Encounter: Payer: Medicare HMO | Admitting: Cardiothoracic Surgery

## 2019-07-27 ENCOUNTER — Telehealth: Payer: Self-pay | Admitting: Cardiology

## 2019-07-27 DIAGNOSIS — I712 Thoracic aortic aneurysm, without rupture, unspecified: Secondary | ICD-10-CM

## 2019-07-27 DIAGNOSIS — I5032 Chronic diastolic (congestive) heart failure: Secondary | ICD-10-CM

## 2019-07-27 DIAGNOSIS — I4819 Other persistent atrial fibrillation: Secondary | ICD-10-CM

## 2019-07-27 NOTE — Telephone Encounter (Signed)
New Message    Pt says Dr Servando Snare is wanting him to have an Echocardiogram done before he sees him again on the 18th     Please advise

## 2019-07-27 NOTE — Telephone Encounter (Signed)
Spoke to patient advised I will place echo order.Scheduler will call back with appointment.He stated he cannot have done on 2/15 and 2/16.

## 2019-07-28 ENCOUNTER — Other Ambulatory Visit: Payer: Self-pay

## 2019-07-28 ENCOUNTER — Ambulatory Visit (HOSPITAL_COMMUNITY): Payer: Medicare HMO | Attending: Internal Medicine

## 2019-07-28 DIAGNOSIS — I5032 Chronic diastolic (congestive) heart failure: Secondary | ICD-10-CM | POA: Diagnosis not present

## 2019-07-28 DIAGNOSIS — I4819 Other persistent atrial fibrillation: Secondary | ICD-10-CM | POA: Diagnosis not present

## 2019-07-28 DIAGNOSIS — I712 Thoracic aortic aneurysm, without rupture, unspecified: Secondary | ICD-10-CM

## 2019-08-03 ENCOUNTER — Ambulatory Visit: Payer: Medicare HMO | Admitting: Cardiothoracic Surgery

## 2019-08-12 ENCOUNTER — Other Ambulatory Visit: Payer: Self-pay | Admitting: Internal Medicine

## 2019-08-12 DIAGNOSIS — E118 Type 2 diabetes mellitus with unspecified complications: Secondary | ICD-10-CM

## 2019-08-12 DIAGNOSIS — E785 Hyperlipidemia, unspecified: Secondary | ICD-10-CM

## 2019-08-18 ENCOUNTER — Ambulatory Visit: Payer: Medicare HMO | Attending: Internal Medicine

## 2019-08-18 DIAGNOSIS — Z23 Encounter for immunization: Secondary | ICD-10-CM | POA: Insufficient documentation

## 2019-08-18 NOTE — Progress Notes (Signed)
   Covid-19 Vaccination Clinic  Name:  Clinton Black    MRN: [id number] DOB: 12-06-1945  08/18/2019  [name] was observed post Covid-19 immunization for 15 minutes without incident. He was provided with Vaccine Information Sheet and instruction to access the V-Safe system.   [name] was instructed to call 911 with any severe reactions post vaccine: Marland Kitchen Difficulty breathing  . Swelling of face and throat  . A fast heartbeat  . A bad rash all over body  . Dizziness and weakness   Immunizations Administered    Name Date Dose VIS Date Route   Pfizer COVID-19 Vaccine 08/18/2019 10:46 AM 0.3 mL 05/26/2019 Intramuscular   Manufacturer: [place]   Lot: [id number]   [place]: [id number]

## 2019-08-23 NOTE — Progress Notes (Deleted)
[address]       [place] [address]             [phone]                    Clinton Black Medical Record [id number] Date of Birth: Aug 23, 1945  Referring: [name], [name], MD Primary Care: [name], MD  Chief Complaint:    No chief complaint on file.   History of Present Illness:    Clinton Black 74 y.o. male followed in office for ascending aortic dilatation.   He returns to the office today with a follow-up CTA of the chest.    Since she was last seen he is developed atrial fibrillation, had cardioversions twice.  Notes that he felt much better for 3 days until atrial fib returned.  He has not had an appointment in atrial fib clinic since March.  He now notes that he has periods of heart rate between 180 and 50.  Since last seen his overall functional status has deteriorated, he notes that he is fatigued all the time, does have some shortness of breath, denies pedal edema  Patient developed Bell's palsy in the winter 2020, symptoms have since resolved  Patient has no family history of aortic dissection he does note that his father died at age 64 suddenly with a cerebral embolus mother died at age 48 he is had one sister died at age 16 of breast cancer and another sister has had breast cancer in atrial fib still alive patient has no children no family history of aortic dissections or aneurysm.   Current Activity/ Functional Status:  Patient is independent with mobility/ambulation, transfers, ADL's, IADL's.   Zubrod Score: At the time of surgery this patient's most appropriate activity status/level should be described as: []     0    Normal activity, no symptoms []     1    Restricted in physical strenuous activity but ambulatory, able to do out light work [x]     2    Ambulatory and capable of self care, unable to do work activities, up and about               >50 % of waking hours                              []     3    Only  limited self care, in bed greater than 50% of waking hours []     4    Completely disabled, no self care, confined to bed or chair []     5    Moribund   Past Medical History:  Diagnosis Date  . Arthritis   . BPH (benign prostatic hyperplasia)   . Diabetes mellitus type 2 in obese (HCC)    diet controlled  . HTN (hypertension)   . Hyperlipidemia, mixed   . Insomnia   . Obesity   . Thoracic aortic aneurysm, without rupture (HCC)    4.7 cm per chest ct with contrast 11-04-17    Past Surgical History:  Procedure Laterality Date  . ATRIAL FIBRILLATION ABLATION N/A 04/07/2019   Procedure: ATRIAL FIBRILLATION ABLATION;  Surgeon: [name], MD;  Location: [facility];  Service: Cardiovascular;  Laterality: N/A;  . CARDIOVERSION N/A 07/12/2018   Procedure: CARDIOVERSION;  Surgeon: Thayer Headings, MD;  Location: [facility];  Service: Cardiovascular;  Laterality: N/A;  . CARDIOVERSION N/A 08/22/2018   Procedure: CARDIOVERSION;  Surgeon: Skeet Latch, MD;  Location: [facility];  Service: Cardiovascular;  Laterality: N/A;  . CARDIOVERSION N/A 06/28/2019   Procedure: CARDIOVERSION;  Surgeon: Thayer Headings, MD;  Location: [facility] ENDOSCOPY;  Service: Cardiovascular;  Laterality: N/A;  . COLONOSCOPY  2016  . CYST EXCISION     polynomial  . CYSTOSCOPY WITH INSERTION OF UROLIFT N/A 04/18/2018   Procedure: CYSTOSCOPY WITH INSERTION OF UROLIFT;  Surgeon: Cleon Gustin, MD;  Location: [facility];  Service: Urology;  Laterality: N/A;  . KNEE SURGERY Right 2000   arthroscopy  . RIGHT/LEFT HEART CATH AND CORONARY ANGIOGRAPHY N/A 01/30/2019   Procedure: RIGHT/LEFT HEART CATH AND CORONARY ANGIOGRAPHY;  Surgeon: [name], [name], MD;  Location: [facility];  Service: Cardiovascular;  Laterality: N/A;  . TONSILLECTOMY    . widson teeth extraction      Family History  Problem Relation Age of Onset  . Aneurysm Father   . Heart disease Father   . Varicose  Veins Mother   . Arthritis Mother   . Macular degeneration Mother   . Cancer Neg Hx   . Colon cancer Neg Hx   . Esophageal cancer Neg Hx   . Rectal cancer Neg Hx   . Stomach cancer Neg Hx     Social History   Socioeconomic History  . Marital status: Single    Spouse name: Not on file  . Number of children: Not on file  . Years of education: Not on file  . Highest education level: Not on file  Occupational History  . Not on file  Social Needs  . Financial resource strain: Not on file  . Food insecurity    Worry: Not on file    Inability: Not on file  . Transportation needs    Medical: Not on file    Non-medical: Not on file  Tobacco Use  . Smoking status: Former Smoker    Packs/day: 1.50    Years: 5.00    Pack years: 7.50    Types: Cigarettes    Quit date: 06/15/1994    Years since quitting: 24.5  . Smokeless tobacco: Never Used  Substance and Sexual Activity  . Alcohol use: Yes    Alcohol/week: 14.0 standard drinks    Types: 14 Shots of liquor per week    Comment: 1 drink of liquor per day  . Drug use: Yes    Types: Marijuana    Comment: occasionally marijuana  . Sexual activity: Yes    Partners: Female    Birth control/protection: Condom    Comment: condom use most of the time but not always  Social History Narrative   [place] and [employer]. married '72- 3 years/divorced; married '89- 3 years/divorced;. married '03 - 3 years/divorced. 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    Social History   Tobacco Use  Smoking Status Former Smoker  . Packs/day: 1.50  . Years: 5.00  . Pack years: 7.50  . Types: Cigarettes  . Quit date: 06/15/1994  . Years since quitting: 25.2  Smokeless Tobacco Never Used    Social History   Substance and Sexual Activity  Alcohol Use Yes  . Alcohol/week: 14.0 standard drinks  . Types: 14 Shots of liquor per week   Comment: 1 drink of liquor per day     Allergies  Allergen Reactions  . Quinolones Other (See Comments)  Patient was warned about not using Cipro and similar antibiotics. Recent studies have raised concern that fluoroquinolone antibiotics could be associated with an increased risk of aortic aneurysm Fluoroquinolones have non-antimicrobial properties that might jeopardise the integrity of the extracellular matrix of the vascular wall In a  propensity score matched cohort study in [place], there was a 66% increased rate of aortic aneurysm or dissection associated with oral fluoroquinolone use, compared wit    Current Outpatient Medications  Medication Sig Dispense Refill  . alfuzosin (UROXATRAL) 10 MG 24 hr tablet Take 1 tablet (10 mg total) by mouth daily with breakfast. 90 tablet 1  . amiodarone (PACERONE) 200 MG tablet Take 1 tablet (200 mg total) by mouth daily. 90 tablet 2  . APPLE CIDER VINEGAR PO Take 2 tablets by mouth daily.    Marland Kitchen atorvastatin (LIPITOR) 20 MG tablet TAKE 1 TABLET BY MOUTH EVERYDAY AT BEDTIME 90 tablet 1  . Empagliflozin-metFORMIN HCl ER (SYNJARDY XR) 25-1000 MG TB24 Take 1 tablet by mouth daily. 84 tablet 0  . GARLIC PO Take 2 capsules by mouth daily.    . hydrocortisone cream 1 % Apply 1 application topically daily as needed (rash).    Marland Kitchen losartan (COZAAR) 25 MG tablet TAKE 2 TABLETS BY MOUTH EVERY DAY 180 tablet 1  . Menthol-Methyl Salicylate (SALONPAS PAIN RELIEF PATCH EX) Apply 1 patch topically daily as needed (back pain).    . metoprolol succinate (TOPROL-XL) 25 MG 24 hr tablet TAKE 1 TABLET BY MOUTH EVERY DAY 90 tablet 2  . Revefenacin (YUPELRI) 175 MCG/3ML SOLN Inhale 1 puff into the lungs daily. 90 mL 5  . rivaroxaban (XARELTO) 20 MG TABS tablet Take 1 tablet (20 mg total) by mouth daily with supper.    Marland Kitchen spironolactone (ALDACTONE) 25 MG tablet TAKE 0.5 TABLETS (12.5 MG TOTAL) BY MOUTH AT BEDTIME. 45 tablet 0  . Tiotropium Bromide Monohydrate (SPIRIVA RESPIMAT) 1.25 MCG/ACT AERS Inhale 2 puffs into the lungs daily. 12 g 1  . triazolam (HALCION) 0.25 MG tablet Take 1  tablet (0.25 mg total) by mouth at bedtime as needed for sleep. 30 tablet 3  . TURMERIC PO Take 2 tablets by mouth daily.     No current facility-administered medications for this visit.    Pertinent items are noted in HPI.   Review of Systems:  ROS     Physical Exam: There were no vitals taken for this visit.  PHYSICAL EXAMINATION:       Recent Radiology Findings:  No results found.   ECHOCARDIOGRAM COMPLETE  Result Date: 07/29/2019    ECHOCARDIOGRAM REPORT   Patient Name:   KOHYN PANCIERA Date of Exam: 07/28/2019 Medical Rec #:  [id number]       Height:       77.0 in Accession #:    [id number]      Weight:       296.0 lb Date of Birth:  1945-10-01      BSA:          2.64 m Patient Age:    66 years        BP:           134/78 mmHg Patient Gender: M               HR:           79 bpm. Exam Location:  [facility] Procedure: 2D Echo  MODIFIED REPORT: This report was modified by Cherlynn Kaiser MD on 07/29/2019 due to corrections.  Indications:     Atrial Fibrillation 427.31 / I48.91  History:         Patient has prior history of Echocardiogram examinations, most                  recent 12/28/2018. CHF, Arrythmias:Atrial Fibrillation; Risk                  Factors:Hypertension, Diabetes and Dyslipidemia. Thoracic                  aortic aneurysm.  Sonographer:     Clayton Lefort RDCS (AE) Referring Phys:  [id number] [name] [name] Diagnosing Phys: Cherlynn Kaiser MD  Sonographer Comments: Image acquisition challenging due to respiratory motion. IMPRESSIONS  1. Left ventricular ejection fraction, by estimation, is 60%. The left ventricle has normal function. The left ventricle has no regional wall motion abnormalities. The left ventricular internal cavity size was mildly dilated. There is mildly increased left ventricular hypertrophy. Left ventricular diastolic parameters are indeterminate.  2. Right ventricular systolic function is normal. The right ventricular size is  normal. Tricuspid regurgitation signal is inadequate for assessing PA pressure.  3. The mitral valve is normal in structure and function. No evidence of mitral valve regurgitation. No evidence of mitral stenosis.  4. The aortic valve is grossly normal. Aortic valve regurgitation is moderate. No aortic stenosis is present.  5. Aortic dilatation noted. There is moderate to severe dilatation of the ascending aorta measuring 53 mm.  6. The inferior vena cava is normal in size with greater than 50% respiratory variability, suggesting right atrial pressure of 3 mmHg. Comparison(s): A prior study was performed on 12/28/2018. Prior images reviewed side by side. Side by side measurements performed. When measured with a similar technique, LV cavity measures 59-61 mm in diastole and 47 mm in systole. LV function is preserved. Ascending aorta measurement has not significantly changed when measured in a similar location, and in comparison to CT PV study 04/03/2019 may not have significantly changed. FINDINGS  Left Ventricle: Left ventricular ejection fraction, by estimation, is 60%. The left ventricle has normal function. The left ventricle has no regional wall motion abnormalities. The left ventricular internal cavity size was mildly dilated. There is mildly increased left ventricular hypertrophy. Left ventricular diastolic parameters are indeterminate. Right Ventricle: The right ventricular size is normal. No increase in right ventricular wall thickness. Right ventricular systolic function is normal. Tricuspid regurgitation signal is inadequate for assessing PA pressure. Left Atrium: Left atrial size was normal in size. Right Atrium: Right atrial size was normal in size. Pericardium: There is no evidence of pericardial effusion. Presence of large anterior epicardial fat pad. Mitral Valve: The mitral valve is normal in structure and function. Normal mobility of the mitral valve leaflets. No evidence of mitral valve  regurgitation. No evidence of mitral valve stenosis. Tricuspid Valve: The tricuspid valve is normal in structure. Tricuspid valve regurgitation is trivial. No evidence of tricuspid stenosis. Aortic Valve: The aortic valve is grossly normal. Aortic valve regurgitation is moderate. Aortic regurgitation PHT measures 622 msec. No aortic stenosis is present. Aortic valve mean gradient measures 4.0 mmHg. Aortic valve peak gradient measures 6.7 mmHg. Aortic valve area, by VTI measures 2.80 cm. Pulmonic Valve: The pulmonic valve was normal in structure. Pulmonic valve regurgitation is trivial. No evidence of pulmonic stenosis. Aorta: Aortic dilatation noted. There is moderate to severe dilatation of the ascending  aorta measuring 53 mm. Venous: The inferior vena cava is normal in size with greater than 50% respiratory variability, suggesting right atrial pressure of 3 mmHg. IAS/Shunts: No atrial level shunt detected by color flow Doppler.  LEFT VENTRICLE PLAX 2D LVIDd:         6.10 cm LVIDs:         4.70 cm LV PW:         1.34 cm LV IVS:        1.25 cm LVOT diam:     2.10 cm LV SV:         90.40 ml LV SV Index:   30.87 LVOT Area:     3.46 cm  RIGHT VENTRICLE            IVC RV Basal diam:  2.76 cm    IVC diam: 1.74 cm RV S prime:     9.53 cm/s TAPSE (M-mode): 2.7 cm LEFT ATRIUM           Index       RIGHT ATRIUM           Index LA diam:      3.10 cm 1.17 cm/m  RA Area:     13.00 cm LA Vol (A2C): 68.0 ml 25.73 ml/m RA Volume:   27.20 ml  10.29 ml/m LA Vol (A4C): 63.6 ml 24.07 ml/m  AORTIC VALVE AV Area (Vmax):    3.01 cm AV Area (Vmean):   2.88 cm AV Area (VTI):     2.80 cm AV Vmax:           129.00 cm/s AV Vmean:          89.300 cm/s AV VTI:            0.323 m AV Peak Grad:      6.7 mmHg AV Mean Grad:      4.0 mmHg LVOT Vmax:         112.00 cm/s LVOT Vmean:        74.300 cm/s LVOT VTI:          0.261 m LVOT/AV VTI ratio: 0.81 AI PHT:            622 msec  AORTA Ao Root diam: 3.50 cm Ao Asc diam:  5.30 cm  SHUNTS  Systemic VTI:  0.26 m Systemic Diam: 2.10 cm Cherlynn Kaiser MD Electronically signed by Cherlynn Kaiser MD Signature Date/Time: 07/29/2019/10:41:43 AM    Final (Updated)     Ct Angio Chest Aorta W &/or Wo Contrast  Result Date: 12/15/2018 CLINICAL DATA:  Follow-up TAA EXAM: CT ANGIOGRAPHY CHEST WITH CONTRAST TECHNIQUE: Multidetector CT imaging of the chest was performed using the standard protocol during bolus administration of intravenous contrast. Multiplanar CT image reconstructions and MIPs were obtained to evaluate the vascular anatomy. CONTRAST:  30mL ISOVUE-370 IOPAMIDOL (ISOVUE-370) INJECTION 76% COMPARISON:  11/04/2017, 02/26/2017 FINDINGS: Cardiovascular: Preferential opacification of the thoracic aorta. Unchanged enlargement of the tubular thoracic aorta, measuring 5.0 x 4.8 cm. The aortic root and sinuses of Valsalva are normal in caliber. Unchanged enlargement of the distal arch and descending thoracic aorta measuring 4.1 x 4.1 cm in the midportion of the descending aorta. Moderate mixed calcific atherosclerosis. Normal heart size. Left coronary artery calcifications. No pericardial effusion. Mediastinum/Nodes: No enlarged mediastinal, hilar, or axillary lymph nodes. Thyroid gland, trachea, and esophagus demonstrate no significant findings. Lungs/Pleura: Scattered nonspecific ground-glass opacity of the superior segment right lower lobe. Stable, small benign pulmonary nodules, for example a 3  mm fissural nodule of the superior segment left lower lobe (series 7, image 72). No pleural effusion or pneumothorax. Upper Abdomen: No acute abnormality. 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 No acute or significant osseous findings. Review of the MIP images confirms the above findings. IMPRESSION: 1. Unchanged enlargement of the tubular thoracic aorta, measuring 5.0 x 4.8 cm. The aortic root and sinuses of Valsalva are normal in caliber. Unchanged enlargement of the distal arch  and descending thoracic aorta measuring 4.1 x 4.1 cm in the midportion of the descending aorta. These findings are not significantly changed when compared to examinations dating back to 02/26/2017 and measured similarly. Moderate mixed calcific atherosclerosis. 2.  Coronary artery disease. 3. Scattered nonspecific ground-glass opacity of the superior segment right lower lobe, likely infectious or inflammatory. 4. Stable, small benign pulmonary nodules, for example a 3 mm fissural nodule of the superior segment left lower lobe (series 7, image 72). Electronically Signed   By: Eddie Candle M.D.   On: 12/15/2018 11:04      I have independently reviewed the above radiology studies  and reviewed the findings with the patient.   Ct Angio Chest Aorta W/cm &/or Wo/cm  Result Date: 11/04/2017 CLINICAL DATA:  Thoracic aortic aneurysm EXAM: CT ANGIOGRAPHY CHEST WITH CONTRAST TECHNIQUE: Multidetector CT imaging of the chest was performed using the standard protocol during bolus administration of intravenous contrast. Multiplanar CT image reconstructions and MIPs were obtained to evaluate the vascular anatomy. CONTRAST:  53mL ISOVUE-370 IOPAMIDOL (ISOVUE-370) INJECTION 76% COMPARISON:  02/26/2017 FINDINGS: Cardiovascular: Maximal diameters of the ascending aorta at the sinus of Valsalva, sino-tubular junction, and ascending aorta are 3.9 cm, 3.8 cm, and 5.2 cm respectively. Based on my direct measurements on the prior study, this is not significantly changed. There is no evidence of aortic dissection. There is no obvious intramural hematoma. Maximal diameter of the proximal descending thoracic aorta is 4.3 cm. The aortic arch is nonaneurysmal. Atherosclerotic calcifications in the aortic arch. Minimal atherosclerotic calcifications in the descending thoracic aorta. Great vessels are patent. Right subclavian artery is ectatic with a maximal diameter of 2.0 cm. Mild LAD territory coronary artery calcifications. No obvious  acute pulmonary thromboembolism. Mediastinum/Nodes: No abnormal adenopathy. No pericardial effusion. Visualized thyroid is atrophic. Mild diffuse wall thickening of the esophagus. Lungs/Pleura: No pneumothorax. No pleural effusion. Pleural base nodule in the lateral right lower lobe on image 92 of series 6 measures 7 mm. This is stable. Upper Abdomen: No acute abnormality. Musculoskeletal: No vertebral compression deformity. Review of the MIP images confirms the above findings. IMPRESSION: Aneurysmal dilatation of the ascending aorta is 5.2 cm. Based on my direct measurements of the prior study, this has not significantly changed. There is no evidence of dissection. Mild aneurysmal dilatation of the descending thoracic aorta at 4.3 cm is also not significantly changed. Ascending thoracic aortic aneurysm. Recommend semi-annual imaging followup by CTA or MRA and referral to cardiothoracic surgery if not already obtained. This recommendation follows 2010 ACCF/AHA/AATS/ACR/ASA/SCA/SCAI/SIR/STS/SVM Guidelines for the Diagnosis and Management of Patients With Thoracic Aortic Disease. Circulation. 2010; 121: [id number] Aortic Atherosclerosis (ICD10-I70.0). Electronically Signed   By: Marybelle Killings M.D.   On: 11/04/2017 09:05   CLINICAL DATA:  Aortic root dilatation.  EXAM: CT ANGIOGRAPHY CHEST WITH CONTRAST  TECHNIQUE: Multidetector CT imaging of the chest was performed using the standard protocol during bolus administration of intravenous contrast. Multiplanar CT image reconstructions and MIPs were obtained to evaluate the vascular anatomy.  CONTRAST:  100 mL of  Isovue 370 intravenously.  COMPARISON:  None.  FINDINGS: Cardiovascular: Aortic root measures 3.9 cm in diameter. 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 Aneurysmal dilatation of descending thoracic aorta is noted with maximum measured diameter 4.1 cm. Aortic atherosclerosis is noted  without dissection. Great vessels are widely patent without stenosis. No pericardial effusion is noted.  Mediastinum/Nodes: No enlarged mediastinal, hilar, or axillary lymph nodes. Thyroid gland, trachea, and esophagus demonstrate no significant findings.  Lungs/Pleura: Lungs are clear. No pleural effusion or pneumothorax.  Upper Abdomen: Probable fatty infiltration of the liver is noted.  Musculoskeletal: 8.6 x 5.3 cm fat containing lesion is seen within the right pectoralis muscle most consistent with intramuscular lipoma.  Review of the MIP images confirms the above findings.  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 Recommend semi-annual imaging followup by CTA or MRA and referral to cardiothoracic surgery if not already obtained. This recommendation follows 2010 ACCF/AHA/AATS/ACR/ASA/SCA/SCAI/SIR/STS/SVM Guidelines for the Diagnosis and Management of Patients With Thoracic Aortic Disease. Circulation. 2010; 121SP:[id number].  Probable fatty infiltration of the liver.  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  Aortic Atherosclerosis (ICD10-I70.0).   Electronically Signed   By: Marijo Conception, M.D.   On: 02/26/2017 11:57  I have independently reviewed the above radiologic studies.  Recent Lab Findings: Lab Results  Component Value Date   WBC 7.9 06/15/2019   HGB 16.3 06/28/2019   HCT 48.0 06/28/2019   PLT 190 06/15/2019   GLUCOSE 150 (H) 06/28/2019   CHOL 148 05/29/2019   TRIG 255.0 (H) 05/29/2019   HDL 34.00 (L) 05/29/2019   LDLDIRECT 78.0 05/29/2019   LDLCALC 48 06/01/2018   ALT 21 06/01/2018   AST 15 06/01/2018   NA 139 06/28/2019   K 4.4 06/28/2019   CL 108 06/28/2019   CREATININE 1.00 06/28/2019   BUN 22 06/28/2019   CO2 25 06/15/2019   TSH 4.27 05/29/2019   HGBA1C 6.6 (H) 05/29/2019    Study Highlights    The left ventricular ejection fraction is moderately decreased  (30-44%).  Nuclear stress EF: 39%.  There was no ST segment deviation noted during stress.  No T wave inversion was noted during stress.  This is an intermediate risk study due to reduced systolic function.  There is no ischemia.    Nuclear History and Indications   History and Indications Indication for Stress Test: Diagnosis of coronary disease History: Hx Acute Bronchitis; No prior cardiac history reported; No prior NUC MPI for comparison. Cardiac Risk Factors: Family History - CAD, History of Smoking, Hypertension, Lipids and Obesity  Symptoms: Dizziness, DOE, Fatigue and SOB  Stress Findings   ECG Baseline ECG exhibits normal sinus rhythm..  Stress Findings A pharmacological stress test was performed using IV Lexiscan 0.4mg  over 10 seconds performed without concurrent submaximal exercise.  The patient reported shortness of breath during the stress test.   Test was stopped per protocol.  Response to Stress There was no ST segment deviation noted during stress.  No T wave inversion was noted during stress. Arrhythmias during stress: frequent PVCs.  Arrhythmias during recovery: none.  Arrhythmias were not significant.  ECG was interpretable and there was no significant change from baseline.  Stress Measurements   Baseline Vitals  Rest HR 63 bpm    Rest BP 141/67 mmHg    Peak Stress Vitals  Peak HR 74 bpm    Peak BP 136/69 mmHg  Nuclear Stress Measurements   LV sys vol 143 mL    TID 0.98     LV dias vol 234 mL    SSS 0     SRS 0     SDS 0          Nuclear Stress Findings   Isotope administration Rest isotope was administered with an IV injection of 31.3 mCi technetium tetrofosmin. Rest SPECT images were obtained approximately 45 minutes post tracer injection. Stress isotope was administered with an IV injection of 29.6 mCi technetium tetrofosmin 20 seconds post IV Lexiscan administration. Stress SPECT images were obtained approximately 60 minutes post  tracer injection.  Nuclear Study Quality Overall image quality is good.  Nuclear Measurements Study was gated.  Rest Perfusion Rest perfusion normal.  Stress Perfusion Stress perfusion normal.  Overall Study Impression Myocardial perfusion is normal. This is an intermediate risk study. Overall left ventricular systolic function was abnormal. LV cavity size is moderately enlarged. Nuclear stress EF: 39%. The left ventricular ejection fraction is moderately decreased (30-44%). There is no prior study for comparison.  From: ACCF/SCAI/STS/AATS/AHA/ASNC/HFSA/SCCT 2012 Appropriate Use Criteria for Coronary Revascularization Focused Update  Wall Scoring   Score Index: 2.000 Percent Normal: 0.0%          The left ventricular wall motion is globally hypokinetic.          Signed   Electronically signed by Skeet Latch, MD on 02/16/17 at [place] EDT  Report approved and finalized on 02/16/2017 1918   Cardiac Cath:  LV end diastolic pressure is normal.  There is moderate (3+) aortic regurgitation.   1. Normal coronary anatomy. Left dominant circulation 2. Normal right heart pressures 3. Normal cardiac output.  4. Moderate aortic insufficiency. No significant stenosis 5. Ascending thoracic aortic aneurysm.   Plan: will review with Dr Servando Snare. If Aortic surgery is not planned in the near future would recommend referral to EP for consideration of ablation of Afib.    Aortic Size Index=  5.0      /There is no height or weight on file to calculate BSA. = 1.78  < 2.75 cm/m2      4% risk per year 2.75 to 4.25          8% risk per year > 4.25 cm/m2    20% risk per year    Assessment / Plan:   #1 dilated ascending aorta approximately  with a trileaflet aortic valve , patient has history of moderate regurgitation on echocardiogram done October 2019, he did not had TEE done at the time of his 2 cardioversions.  #2  Moderate risk nuclear stress test done by cardiology last year-not further  evaluated cath 01/2019  #3 atrial fibrillation with poor rate control per the patient symptoms and his watch, he notes rates between 50 and 180,   [name] MD      [address] [place], [address] Office [phone]   Beeper [phone]  08/23/2019 11:38 AM

## 2019-08-24 ENCOUNTER — Other Ambulatory Visit: Payer: Medicare HMO

## 2019-08-24 ENCOUNTER — Ambulatory Visit: Payer: Medicare HMO | Admitting: Cardiothoracic Surgery

## 2019-08-28 ENCOUNTER — Other Ambulatory Visit: Payer: Self-pay | Admitting: Cardiology

## 2019-08-31 ENCOUNTER — Other Ambulatory Visit: Payer: Self-pay | Admitting: Cardiology

## 2019-08-31 NOTE — Telephone Encounter (Signed)
Rx has been sent to the pharmacy electronically. ° °

## 2019-09-05 ENCOUNTER — Telehealth: Payer: Self-pay | Admitting: Internal Medicine

## 2019-09-05 NOTE — Telephone Encounter (Signed)
New message:   1.Medication Requested: Empagliflozin-metFORMIN HCl ER (SYNJARDY XR) 25-1000 MG TB24 2. Pharmacy (Name, [place], [facility]): [facility] - [place], [place] 3. On Med List: Yes 4. Last Visit with PCP: 05/29/19  5. Next visit date with PCP: None scheduled   Agent: Please be advised that RX refills may take up to 3 business days. We ask that you follow-up with your pharmacy.

## 2019-09-05 NOTE — Telephone Encounter (Signed)
Called [employer] - They are requesting a refill.   Reviewed chart and patient is due for follow up.   Called pt and lvm informing of same.  Appt will be needed for any refills.

## 2019-09-06 ENCOUNTER — Other Ambulatory Visit: Payer: Self-pay

## 2019-09-06 ENCOUNTER — Ambulatory Visit (INDEPENDENT_AMBULATORY_CARE_PROVIDER_SITE_OTHER): Payer: Medicare HMO | Admitting: Internal Medicine

## 2019-09-06 ENCOUNTER — Encounter: Payer: Self-pay | Admitting: Internal Medicine

## 2019-09-06 VITALS — BP 132/60 | HR 58 | Temp 98.1°F | Ht 77.0 in | Wt 297.0 lb

## 2019-09-06 DIAGNOSIS — R7989 Other specified abnormal findings of blood chemistry: Secondary | ICD-10-CM

## 2019-09-06 DIAGNOSIS — E118 Type 2 diabetes mellitus with unspecified complications: Secondary | ICD-10-CM

## 2019-09-06 DIAGNOSIS — K625 Hemorrhage of anus and rectum: Secondary | ICD-10-CM | POA: Insufficient documentation

## 2019-09-06 DIAGNOSIS — I10 Essential (primary) hypertension: Secondary | ICD-10-CM

## 2019-09-06 LAB — CBC WITH DIFFERENTIAL/PLATELET
Basophils Absolute: 0.1 10*3/uL (ref 0.0–0.1)
Basophils Relative: 0.8 % (ref 0.0–3.0)
Eosinophils Absolute: 0.4 10*3/uL (ref 0.0–0.7)
Eosinophils Relative: 5.1 % — ABNORMAL HIGH (ref 0.0–5.0)
HCT: 43.4 % (ref 39.0–52.0)
Hemoglobin: 14.3 g/dL (ref 13.0–17.0)
Lymphocytes Relative: 24 % (ref 12.0–46.0)
Lymphs Abs: 2 10*3/uL (ref 0.7–4.0)
MCHC: 33.1 g/dL (ref 30.0–36.0)
MCV: 89.2 fl (ref 78.0–100.0)
Monocytes Absolute: 0.5 10*3/uL (ref 0.1–1.0)
Monocytes Relative: 6.3 % (ref 3.0–12.0)
Neutro Abs: 5.3 10*3/uL (ref 1.4–7.7)
Neutrophils Relative %: 63.8 % (ref 43.0–77.0)
Platelets: 188 10*3/uL (ref 150.0–400.0)
RBC: 4.86 Mil/uL (ref 4.22–5.81)
RDW: 14.4 % (ref 11.5–15.5)
WBC: 8.3 10*3/uL (ref 4.0–10.5)

## 2019-09-06 LAB — BASIC METABOLIC PANEL
BUN: 19 mg/dL (ref 6–23)
CO2: 27 mEq/L (ref 19–32)
Calcium: 9.6 mg/dL (ref 8.4–10.5)
Chloride: 106 mEq/L (ref 96–112)
Creatinine, Ser: 1.05 mg/dL (ref 0.40–1.50)
GFR: 69.38 mL/min (ref >60.00)
Glucose, Bld: 106 mg/dL — ABNORMAL HIGH (ref 70–99)
Potassium: 4.6 mEq/L (ref 3.5–5.1)
Sodium: 140 mEq/L (ref 135–145)

## 2019-09-06 LAB — TSH: TSH: 4.29 u[IU]/mL (ref 0.35–4.50)

## 2019-09-06 LAB — HEMOGLOBIN A1C: Hgb A1c MFr Bld: 6.6 % — ABNORMAL HIGH (ref 4.6–6.5)

## 2019-09-06 MED ORDER — SYNJARDY XR 25-1000 MG PO TB24: 1.0000 | ORAL_TABLET | Freq: Every day | ORAL | 1 refills | Status: DC

## 2019-09-06 NOTE — Progress Notes (Signed)
Subjective:  Patient ID: Clinton Black, male    DOB: [birth year]  Age: 74 y.o. MRN: [id number]  CC: Hypertension and Diabetes  This visit occurred during the SARS-CoV-2 public health emergency.  Safety protocols were in place, including screening questions prior to the visit, additional usage of staff PPE, and extensive cleaning of exam room while observing appropriate contact time as indicated for disinfecting solutions.    HPI Clinton Black presents for f/up - About 2 weeks ago he developed constipation and straining.  After that he noticed blood in his stool for about 10 days.  He says he was passing a couple of tablespoons.  He had some anal discomfort with this.  He tells me that he has seen no blood in his stool for the last 3 days and his bowel movements have been normal.  He denies abdominal pain or melena.  He continues to complain of fatigue and DOE.  Outpatient Medications Prior to Visit  Medication Sig Dispense Refill  . alfuzosin (UROXATRAL) 10 MG 24 hr tablet Take 1 tablet (10 mg total) by mouth daily with breakfast. 90 tablet 1  . amiodarone (PACERONE) 200 MG tablet Take 1 tablet (200 mg total) by mouth daily. 90 tablet 2  . APPLE CIDER VINEGAR PO Take 2 tablets by mouth daily.    Marland Kitchen atorvastatin (LIPITOR) 20 MG tablet TAKE 1 TABLET BY MOUTH EVERYDAY AT BEDTIME 90 tablet 1  . GARLIC PO Take 2 capsules by mouth daily.    . hydrocortisone cream 1 % Apply 1 application topically daily as needed (rash).    Marland Kitchen losartan (COZAAR) 25 MG tablet TAKE 2 TABLETS BY MOUTH EVERY DAY 180 tablet 2  . Menthol-Methyl Salicylate (SALONPAS PAIN RELIEF PATCH EX) Apply 1 patch topically daily as needed (back pain).    . metoprolol succinate (TOPROL-XL) 25 MG 24 hr tablet TAKE 1 TABLET BY MOUTH EVERY DAY 90 tablet 2  . Revefenacin (YUPELRI) 175 MCG/3ML SOLN Inhale 1 puff into the lungs daily. 90 mL 5  . rivaroxaban (XARELTO) 20 MG TABS tablet Take 1 tablet (20 mg total) by mouth daily with supper.     Marland Kitchen spironolactone (ALDACTONE) 25 MG tablet TAKE 0.5 TABLETS (12.5 MG TOTAL) BY MOUTH AT BEDTIME. 45 tablet 0  . Tiotropium Bromide Monohydrate (SPIRIVA RESPIMAT) 1.25 MCG/ACT AERS Inhale 2 puffs into the lungs daily. 12 g 1  . triazolam (HALCION) 0.25 MG tablet Take 1 tablet (0.25 mg total) by mouth at bedtime as needed for sleep. 30 tablet 3  . TURMERIC PO Take 2 tablets by mouth daily.    . Empagliflozin-metFORMIN HCl ER (SYNJARDY XR) 25-1000 MG TB24 Take 1 tablet by mouth daily. 84 tablet 0   No facility-administered medications prior to visit.    ROS Review of Systems  Constitutional: Positive for fatigue. Negative for appetite change, chills, diaphoresis and unexpected weight change.  HENT: Negative.   Eyes: Negative.   Respiratory: Positive for shortness of breath. Negative for chest tightness and wheezing.   Cardiovascular: Negative for chest pain, palpitations and leg swelling.  Gastrointestinal: Positive for anal bleeding and blood in stool. Negative for abdominal pain, constipation, diarrhea, nausea and vomiting.  Musculoskeletal: Positive for arthralgias. Negative for myalgias.  Skin: Negative.   Neurological: Negative.  Negative for dizziness, weakness and light-headedness.  Hematological: Negative for adenopathy. Does not bruise/bleed easily.  Psychiatric/Behavioral: Negative.     Objective:  BP 132/60 (BP Location: Left Arm, Patient Position: Sitting, Cuff Size: Large)  Pulse (!) 58   Temp 98.1 F (36.7 C) (Oral)   Ht 6\' 5"  (1.956 m)   Wt 297 lb (134.7 kg)   SpO2 97%   BMI 35.22 kg/m   BP Readings from Last 3 Encounters:  09/06/19 132/60  07/17/19 132/64  07/05/19 (!) 108/52    Wt Readings from Last 3 Encounters:  09/06/19 297 lb (134.7 kg)  07/17/19 296 lb (134.3 kg)  07/05/19 295 lb (133.8 kg)    Physical Exam Vitals reviewed.  Constitutional:      Appearance: He is obese.  HENT:     Nose: Nose normal.     Mouth/Throat:     Mouth: Mucous  membranes are moist.  Eyes:     General: No scleral icterus.    Conjunctiva/sclera: Conjunctivae normal.  Cardiovascular:     Rate and Rhythm: Normal rate and regular rhythm.     Heart sounds: No murmur.  Pulmonary:     Effort: Pulmonary effort is normal.     Breath sounds: No stridor. No wheezing, rhonchi or rales.  Abdominal:     General: Abdomen is protuberant. Bowel sounds are normal. There is no distension.     Palpations: There is no hepatomegaly, splenomegaly or mass.     Tenderness: There is no abdominal tenderness.  Genitourinary:    Comments: Rectal exam was deferred at his request.  He tells me he is seeing his urologist in the next few weeks and he will have his rectal exam done at that time. Musculoskeletal:        General: Normal range of motion.     Cervical back: Neck supple.     Right lower leg: No edema.     Left lower leg: No edema.  Lymphadenopathy:     Cervical: No cervical adenopathy.  Skin:    General: Skin is warm and dry.     Coloration: Skin is not pale.  Neurological:     General: No focal deficit present.     Mental Status: He is alert.     Lab Results  Component Value Date   WBC 8.3 09/06/2019   HGB 14.3 09/06/2019   HCT 43.4 09/06/2019   PLT 188.0 09/06/2019   GLUCOSE 106 (H) 09/06/2019   CHOL 148 05/29/2019   TRIG 255.0 (H) 05/29/2019   HDL 34.00 (L) 05/29/2019   LDLDIRECT 78.0 05/29/2019   LDLCALC 48 06/01/2018   ALT 21 06/01/2018   AST 15 06/01/2018   NA 140 09/06/2019   K 4.6 09/06/2019   CL 106 09/06/2019   CREATININE 1.05 09/06/2019   BUN 19 09/06/2019   CO2 27 09/06/2019   TSH 4.29 09/06/2019   PSA 4.75 (H) 05/29/2019   HGBA1C 6.6 (H) 09/06/2019   MICROALBUR 0.9 05/29/2019    No results found.  Assessment & Plan:   Clinton Black was seen today for hypertension and diabetes.  Diagnoses and all orders for this visit:  Essential hypertension- His blood pressure is adequately well controlled. -     CBC with  Differential/Platelet; Future -     Basic metabolic panel; Future -     Basic metabolic panel -     CBC with Differential/Platelet  BRBPR (bright red blood per rectum)- He has had frank blood per rectum that sounds like it was hemorrhoidal bleeding.  The bleeding has not recurred over the last 3 days.  He had a normal colonoscopy 4-1/2 years ago.  He deferred on a rectal exam today.  This is  likely hemorrhoidal bleeding.  He will let me know if it recurs. -     CBC with Differential/Platelet; Future -     CBC with Differential/Platelet  Type 2 diabetes mellitus with complication, without long-term current use of insulin ([place])- His blood sugars are adequately well controlled. -     Empagliflozin-metFORMIN HCl ER (SYNJARDY XR) 25-1000 MG TB24; Take 1 tablet by mouth daily. -     Hemoglobin A1c; Future -     Basic metabolic panel; Future -     Basic metabolic panel -     Hemoglobin A1c  TSH elevation- His TSH is normal now. -     TSH; Future -     TSH   I am having [name] maintain his hydrocortisone cream, Menthol-Methyl Salicylate (SALONPAS PAIN RELIEF PATCH EX), rivaroxaban, alfuzosin, triazolam, TURMERIC PO, APPLE CIDER VINEGAR PO, GARLIC PO, Yupelri, Spiriva Respimat, metoprolol succinate, amiodarone, atorvastatin, spironolactone, losartan, and Synjardy XR.  Meds ordered this encounter  Medications  . Empagliflozin-metFORMIN HCl ER (SYNJARDY XR) 25-1000 MG TB24    Sig: Take 1 tablet by mouth daily.    Dispense:  90 tablet    Refill:  1     Follow-up: Return in about 6 months (around 03/08/2020).  Scarlette Calico, MD

## 2019-09-06 NOTE — Patient Instructions (Signed)
Type 2 Diabetes Mellitus, Diagnosis, Adult Type 2 diabetes (type 2 diabetes mellitus) is a long-term (chronic) disease. In type 2 diabetes, one or both of these problems may be present:  The pancreas does not make enough of a hormone called insulin.  Cells in the body do not respond properly to insulin that the body makes (insulin resistance). Normally, insulin allows blood sugar (glucose) to enter cells in the body. The cells use glucose for energy. Insulin resistance or lack of insulin causes excess glucose to build up in the blood instead of going into cells. As a result, high blood glucose (hyperglycemia) develops. What increases the risk? The following factors may make you more likely to develop type 2 diabetes:  Having a family member with type 2 diabetes.  Being overweight or obese.  Having an inactive (sedentary) lifestyle.  Having been diagnosed with insulin resistance.  Having a history of prediabetes, gestational diabetes, or polycystic ovary syndrome (PCOS).  Being of American-Indian, African-American, Hispanic/Latino, or Asian/Pacific Islander descent. What are the signs or symptoms? In the early stage of this condition, you may not have symptoms. Symptoms develop slowly and may include:  Increased thirst (polydipsia).  Increased hunger(polyphagia).  Increased urination (polyuria).  Increased urination during the night (nocturia).  Unexplained weight loss.  Frequent infections that keep coming back (recurring).  Fatigue.  Weakness.  Vision changes, such as blurry vision.  Cuts or bruises that are slow to heal.  Tingling or numbness in the hands or feet.  Dark patches on the skin (acanthosis nigricans). How is this diagnosed? This condition is diagnosed based on your symptoms, your medical history, a physical exam, and your blood glucose level. Your blood glucose may be checked with one or more of the following blood tests:  A fasting blood glucose (FBG)  test. You will not be allowed to eat (you will fast) for 8 hours or longer before a blood sample is taken.  A random blood glucose test. This test checks blood glucose at any time of day regardless of when you ate.  An A1c (hemoglobin A1c) blood test. This test provides information about blood glucose control over the previous 2-3 months.  An oral glucose tolerance test (OGTT). This test measures your blood glucose at two times: ? After fasting. This is your baseline blood glucose level. ? Two hours after drinking a beverage that contains glucose. You may be diagnosed with type 2 diabetes if:  Your FBG level is 126 mg/dL (7.0 mmol/L) or higher.  Your random blood glucose level is 200 mg/dL (11.1 mmol/L) or higher.  Your A1c level is 6.5% or higher.  Your OGTT result is higher than 200 mg/dL (11.1 mmol/L). These blood tests may be repeated to confirm your diagnosis. How is this treated? Your treatment may be managed by a specialist called an endocrinologist. Type 2 diabetes may be treated by following instructions from your health care provider about:  Making diet and lifestyle changes. This may include: ? Following an individualized nutrition plan that is developed by a diet and nutrition specialist (registered dietitian). ? Exercising regularly. ? Finding ways to manage stress.  Checking your blood glucose level as often as told.  Taking diabetes medicines or insulin daily. This helps to keep your blood glucose levels in the healthy range. ? If you use insulin, you may need to adjust the dosage depending on how physically active you are and what foods you eat. Your health care provider will tell you how to adjust your dosage.    Taking medicines to help prevent complications from diabetes, such as: ? Aspirin. ? Medicine to lower cholesterol. ? Medicine to control blood pressure. Your health care provider will set individualized treatment goals for you. Your goals will be based on  your age, other medical conditions you have, and how you respond to diabetes treatment. Generally, the goal of treatment is to maintain the following blood glucose levels:  Before meals (preprandial): 80-130 mg/dL (4.4-7.2 mmol/L).  After meals (postprandial): below 180 mg/dL (10 mmol/L).  A1c level: less than 7%. Follow these instructions at home: Questions to ask your health care provider  Consider asking the following questions: ? Do I need to meet with a diabetes educator? ? Where can I find a support group for people with diabetes? ? What equipment will I need to manage my diabetes at home? ? What diabetes medicines do I need, and when should I take them? ? How often do I need to check my blood glucose? ? What number can I call if I have questions? ? When is my next appointment? General instructions  Take over-the-counter and prescription medicines only as told by your health care provider.  Keep all follow-up visits as told by your health care provider. This is important.  For more information about diabetes, visit: ? American Diabetes Association (ADA): www.diabetes.org ? American Association of Diabetes Educators (AADE): www.diabeteseducator.org Contact a health care provider if:  Your blood glucose is at or above 240 mg/dL (13.3 mmol/L) for 2 days in a row.  You have been sick or have had a fever for 2 days or longer, and you are not getting better.  You have any of the following problems for more than 6 hours: ? You cannot eat or drink. ? You have nausea and vomiting. ? You have diarrhea. Get help right away if:  Your blood glucose is lower than 54 mg/dL (3.0 mmol/L).  You become confused or you have trouble thinking clearly.  You have difficulty breathing.  You have moderate or large ketone levels in your urine. Summary  Type 2 diabetes (type 2 diabetes mellitus) is a long-term (chronic) disease. In type 2 diabetes, the pancreas does not make enough of a  hormone called insulin, or cells in the body do not respond properly to insulin that the body makes (insulin resistance).  This condition is treated by making diet and lifestyle changes and taking diabetes medicines or insulin.  Your health care provider will set individualized treatment goals for you. Your goals will be based on your age, other medical conditions you have, and how you respond to diabetes treatment.  Keep all follow-up visits as told by your health care provider. This is important. This information is not intended to replace advice given to you by your health care provider. Make sure you discuss any questions you have with your health care provider. Document Revised: 07/30/2017 Document Reviewed: 07/05/2015 Elsevier Patient Education  2020 Elsevier Inc.  

## 2019-09-07 ENCOUNTER — Ambulatory Visit: Payer: Medicare HMO | Admitting: Internal Medicine

## 2019-09-08 ENCOUNTER — Other Ambulatory Visit: Payer: Self-pay | Admitting: Internal Medicine

## 2019-09-08 DIAGNOSIS — I4891 Unspecified atrial fibrillation: Secondary | ICD-10-CM

## 2019-09-13 ENCOUNTER — Ambulatory Visit: Payer: Medicare HMO | Attending: Internal Medicine

## 2019-09-13 DIAGNOSIS — Z23 Encounter for immunization: Secondary | ICD-10-CM

## 2019-09-13 NOTE — Progress Notes (Signed)
   Covid-19 Vaccination Clinic  Name:  Clinton Black    MRN: [id number] DOB: 01/25/1946  09/13/2019  [name] was observed post Covid-19 immunization for 15 minutes without incident. He was provided with Vaccine Information Sheet and instruction to access the V-Safe system.   [name] was instructed to call 911 with any severe reactions post vaccine: Marland Kitchen Difficulty breathing  . Swelling of face and throat  . A fast heartbeat  . A bad rash all over body  . Dizziness and weakness   Immunizations Administered    Name Date Dose VIS Date Route   Pfizer COVID-19 Vaccine 09/13/2019 10:29 AM 0.3 mL 05/26/2019 Intramuscular   Manufacturer: [place]   Lot: [id number]   [place]: [id number]

## 2019-09-14 ENCOUNTER — Ambulatory Visit
Admission: RE | Admit: 2019-09-14 | Discharge: 2019-09-14 | Disposition: A | Discharge status: Home / Self Care | Payer: Medicare HMO | Source: Ambulatory Visit | Attending: Cardiothoracic Surgery | Admitting: Cardiothoracic Surgery

## 2019-09-14 ENCOUNTER — Other Ambulatory Visit: Payer: Self-pay

## 2019-09-14 ENCOUNTER — Ambulatory Visit: Payer: Medicare HMO | Admitting: Cardiothoracic Surgery

## 2019-09-14 ENCOUNTER — Encounter: Payer: Self-pay | Admitting: Cardiothoracic Surgery

## 2019-09-14 VITALS — BP 114/59 | HR 57 | Temp 97.7°F | Resp 16 | Ht 77.0 in | Wt 297.0 lb

## 2019-09-14 DIAGNOSIS — I712 Thoracic aortic aneurysm, without rupture, unspecified: Secondary | ICD-10-CM

## 2019-09-14 IMAGING — CT CT ANGIO CHEST
2 of 6 series · 13 of 36 positions shown · IV contrast (iopamidol)
Comparison: Chest CTA [DATE], included portion from coronary
CTA [DATE]

CLINICAL DATA: Follow-up thoracic aortic aneurysm

EXAM:
CT ANGIOGRAPHY CHEST WITH CONTRAST
TECHNIQUE: Multidetector CT imaging of the chest was performed using the
standard protocol during bolus administration of intravenous
contrast. Multiplanar CT image reconstructions and MIPs were
obtained to evaluate the vascular anatomy.
CONTRAST:  75mL [EA] IOPAMIDOL ([EA]) INJECTION 76%

[Series 5: cta thorax 2.00 bv36 s3 axial arterial · axial · arterial · 0.74mm/px · z∈[+1679,+1953]mm · 12 of 163 slices shown]
[im 13/163  lung]
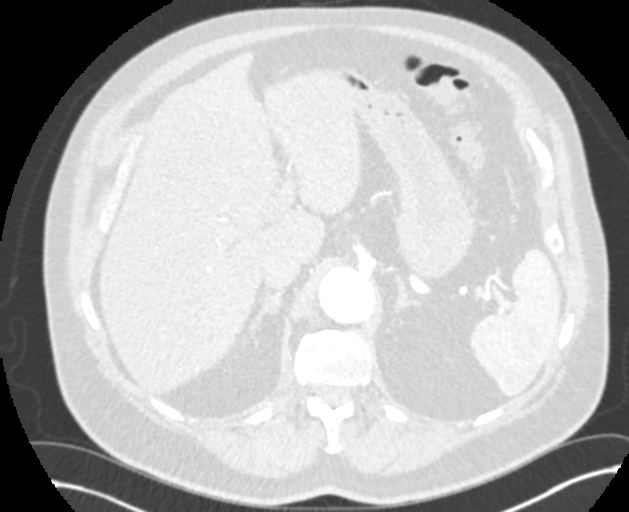
[im 25/163  mediastinal]
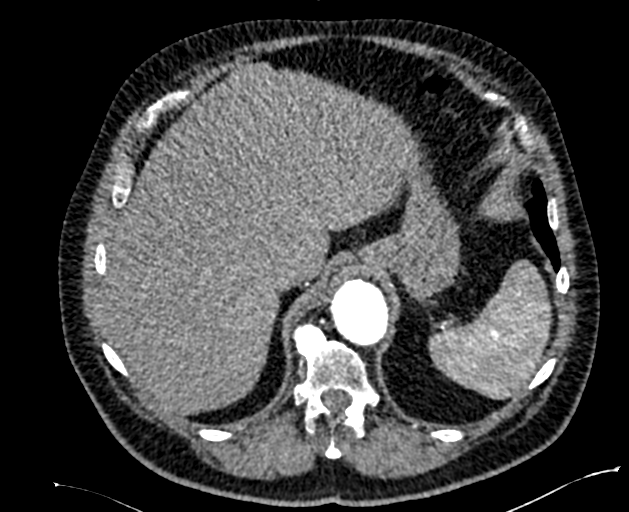
[im 38/163  lung]
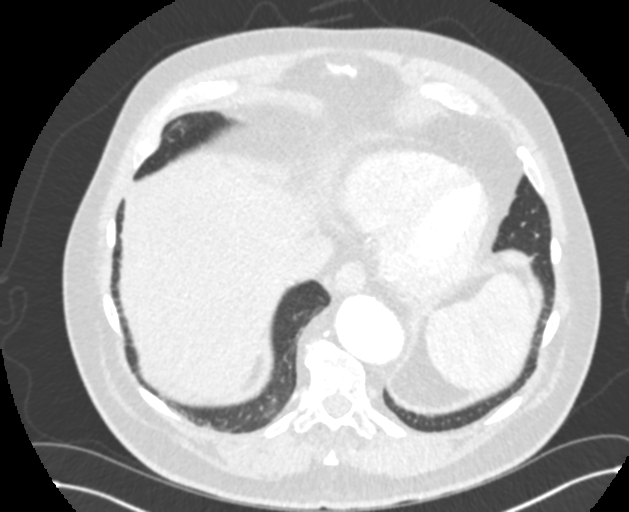
[im 50/163  mediastinal]
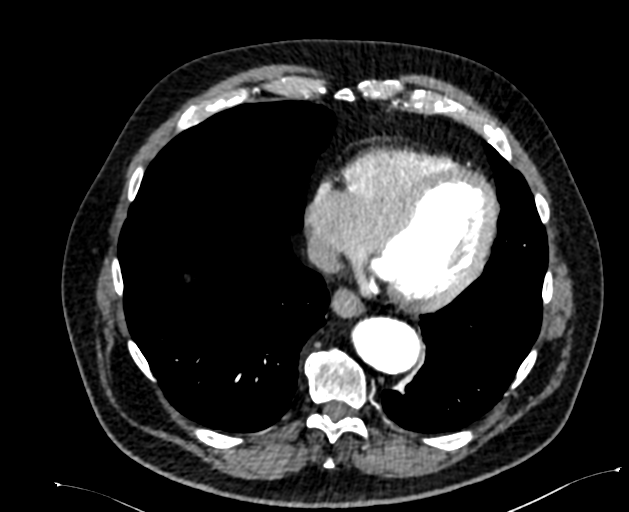
[im 63/163  lung]
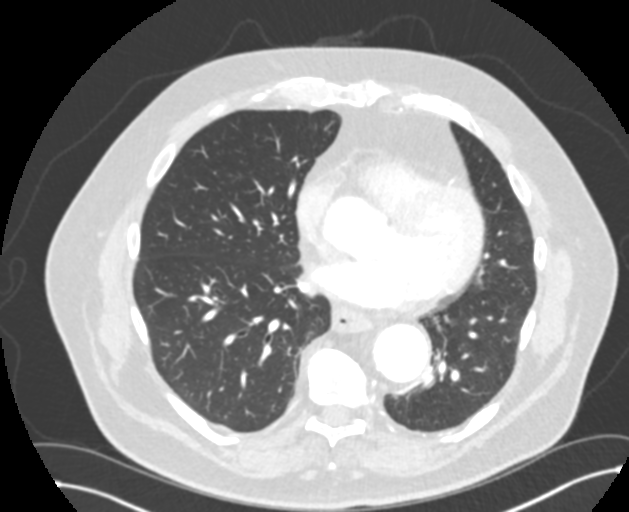
[im 75/163  mediastinal]
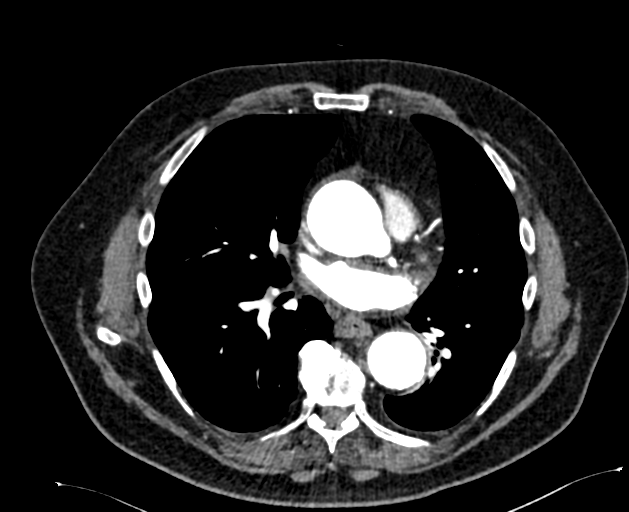
[im 88/163  lung]
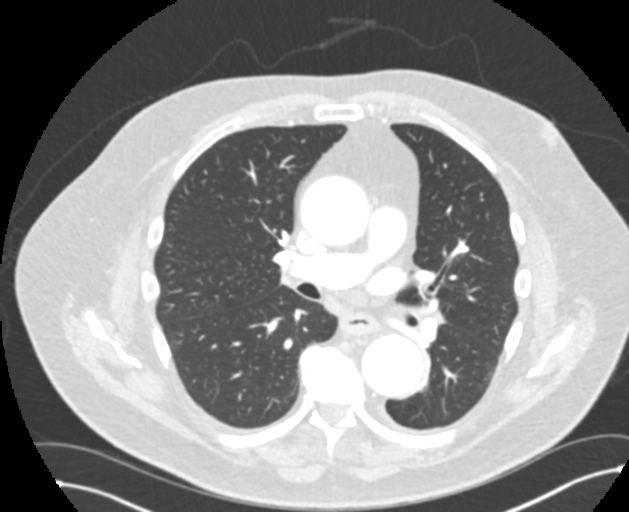
[im 100/163  mediastinal]
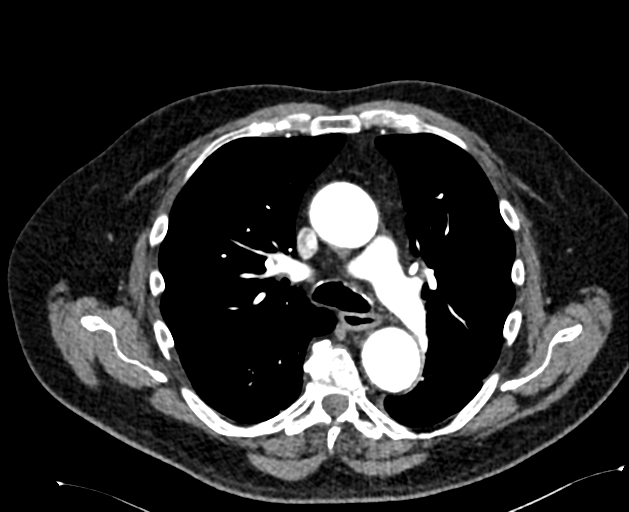
[im 113/163  lung]
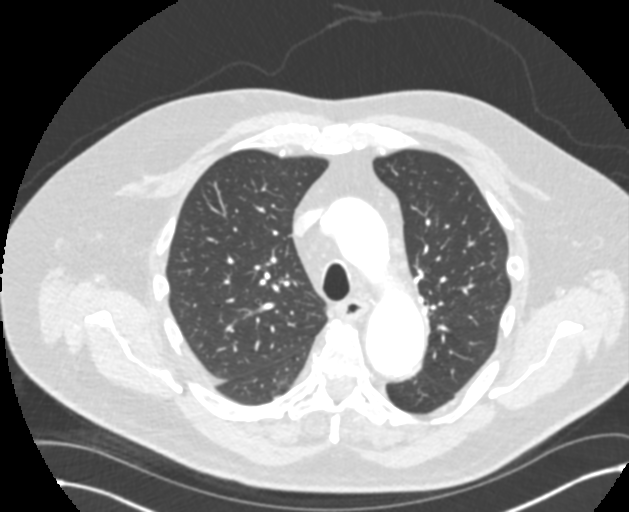
[im 125/163  mediastinal]
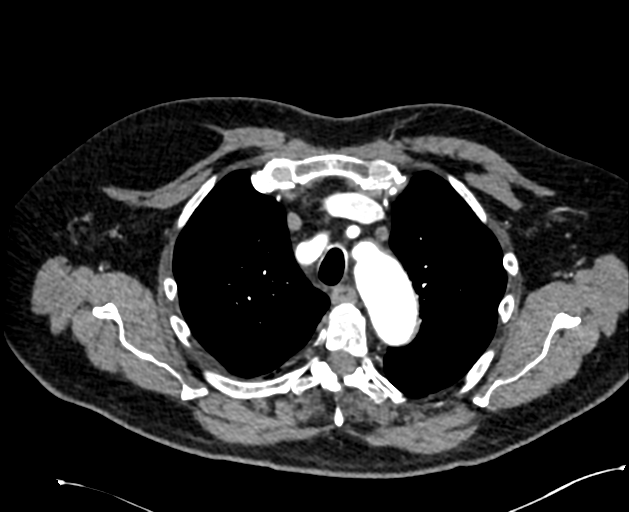
[im 138/163  lung]
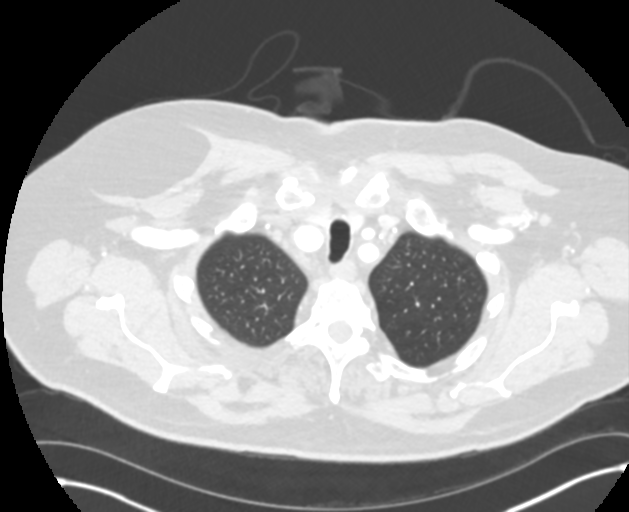
[im 150/163  mediastinal]
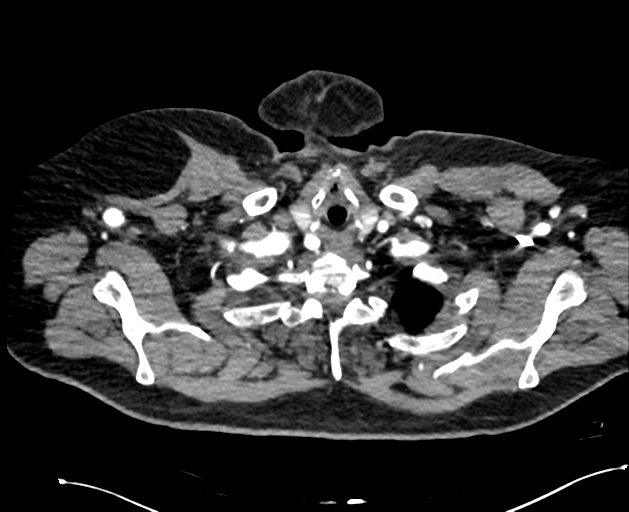

[Series 10: cta thorax 2.00 bv36 s3 cor st · coronal · 0.64mm/px · 1 of 190 slices shown]
[im 95/190  mediastinal]
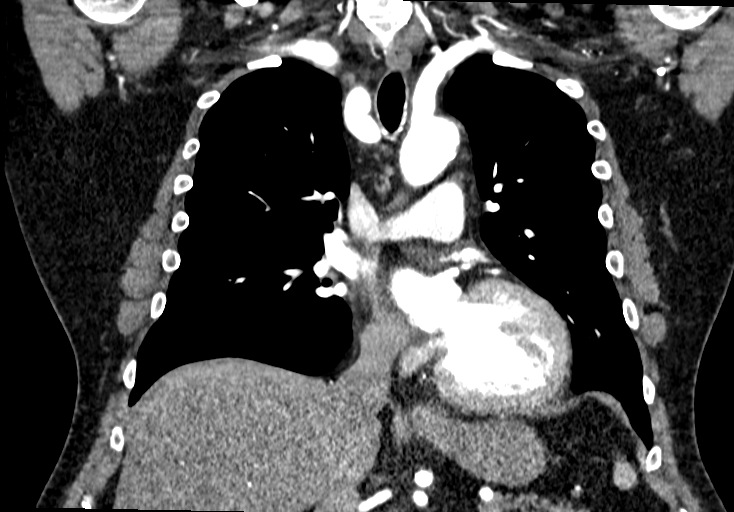

[13 of 36 positions shown; findings below may reference images not displayed]

FINDINGS: Cardiovascular: Fusiform aneurysmal dilatation of the proximal
ascending aorta measuring 5.3 cm greatest dimension. This measured
5.2 cm on [DATE] exam, and 5 cm on [DATE] exam. Distal
aspect of the ascending aorta measures 4.4 cm (series 10, image 82.
Normal caliber of the transverse aorta. Slight increase enlargement
of the distal arch and descending aorta, 4.3 cm, series 5, image 47,
previously 4.1 cm. Proximal descending aorta measures 4.5 cm, series
12, image 146. Distal descending aorta at the level of the left
atrium measures 3.8 cm. The aorta at the diaphragmatic hiatus
measures 3.7 cm. No dissection or perivascular stranding. Moderate
calcified and noncalcified atheromatous plaque. Conventional
branching pattern from the aortic arch. Brachiocephalic artery is
tortuous. Heart is normal in size. There are coronary artery
calcifications. Minimal pericardial effusion measuring up to 9 mm in
depth adjacent to the right ventricle.

Mediastinum/Nodes: No enlarged mediastinal or hilar lymph nodes.
Multiple small mediastinal lymph nodes are similar to prior and
likely reactive. No visualized thyroid nodule. No definite
esophageal wall thickening.

Lungs/Pleura: Minor linear/compressive atelectasis in the left lower
lobe. Unchanged nonspecific ground-glass opacity in the superior
most segment of the right lower lobe. Stable perifissural nodule in
the left lower lobe series 7, image 70. Stable pleural-based 6 mm
nodule in the right lower lobe series 7, image 85. No new pulmonary
nodule. No confluent airspace disease. Trachea and mainstem bronchi
are patent. No pleural fluid. No evidence of pulmonary edema.

Upper Abdomen: No acute findings. Atherosclerosis of upper abdominal
aorta.

Musculoskeletal: There are no acute or suspicious osseous
abnormalities. Intramuscular lipoma involving the right pectoral
musculature spans 9.6 cm and is unchanged.

Review of the MIP images confirms the above findings.
IMPRESSION: 1. Fusiform ascending thoracic aortic aneurysm measures 5.3 cm
greatest dimension, previously 5.2 cm on [DATE] exam. Slight
increase enlargement of the distal arch and descending aorta,
cm, previously 4.1 cm. No dissection or acute aortic abnormality.
Aortic aneurysm NOS ([EA]-[EA])
2. Stable pulmonary nodules.
3. Coronary artery calcifications.

Aortic Atherosclerosis ([EA]-[EA]).

## 2019-09-14 MED ORDER — IOPAMIDOL (ISOVUE-370) INJECTION 76%
75.0000 mL | Freq: Once | INTRAVENOUS | Status: AC | PRN
  Administered 2019-09-14: 75 mL via INTRAVENOUS

## 2019-09-14 NOTE — Progress Notes (Signed)
[address]       [place] [address]             [phone]                    Clinton Black [facility] Medical Record [id number] Date of Birth: [birth year]  Referring: [name], [name], MD Primary Care: Clinton Lima, MD  Chief Complaint:    Chief Complaint  Patient presents with  . TAA    6 month f/u with CTA CHEST today...ECHO 07/28/19    History of Present Illness:    Clinton Black 74 y.o. male followed in office for ascending aortic dilatation.   He returns to the office today with a follow-up CTA of the chest.    He is now status post AF ablation 04/07/2019.  Unfortunately he did go back into atrial fibrillation then noted by cardiac monitoring.  He is currently on amiodarone.  He had his cardioversion 06/28/2019.  Since per A. fib clinic notes he is remained in sinus rhythm  Patient developed Bell's palsy in the winter 2020, symptoms have since resolved  Patient has no family history of aortic dissection he does note that his father died at age 5 suddenly with a cerebral embolus mother died at age 26 he is had one sister died at age 35 of breast cancer and another sister has had breast cancer in atrial fib still alive patient has no children no family history of aortic dissections or aneurysm.  When initially the patient was converted to sinus rhythm he noted significant improvement in his overall symptoms.  Now in spite of being in sinus rhythm he notes increasing shortness of breath with exertion.  He is able to maintain usual activities around the house he does care for his fiance who has significant neuropathy and needs extensive care.    Current Activity/ Functional Status:  Patient is independent with mobility/ambulation, transfers, ADL's, IADL's.   Zubrod Score: At the time of surgery this patient's most appropriate activity status/level should be described as: []     0    Normal activity, no symptoms []     1    Restricted in  physical strenuous activity but ambulatory, able to do out light work [x]     2    Ambulatory and capable of self care, unable to do work activities, up and about               >50 % of waking hours                              []     3    Only limited self care, in bed greater than 50% of waking hours []     4    Completely disabled, no self care, confined to bed or chair []     5    Moribund   Past Medical History:  Diagnosis Date  . Arthritis   . BPH (benign prostatic hyperplasia)   . Diabetes mellitus type 2 in obese (HCC)    diet controlled  . HTN (hypertension)   . Hyperlipidemia, mixed   . Insomnia   . Obesity   . Thoracic aortic aneurysm, without rupture (HCC)    4.7 cm per chest ct with contrast 11-04-17    Past Surgical History:  Procedure Laterality Date  . ATRIAL FIBRILLATION ABLATION N/A 04/07/2019  Procedure: ATRIAL FIBRILLATION ABLATION;  Surgeon: [name], MD;  Location: [facility];  Service: Cardiovascular;  Laterality: N/A;  . CARDIOVERSION N/A 07/12/2018   Procedure: CARDIOVERSION;  Surgeon: Thayer Headings, MD;  Location: [facility] ENDOSCOPY;  Service: Cardiovascular;  Laterality: N/A;  . CARDIOVERSION N/A 08/22/2018   Procedure: CARDIOVERSION;  Surgeon: Skeet Latch, MD;  Location: [facility];  Service: Cardiovascular;  Laterality: N/A;  . CARDIOVERSION N/A 06/28/2019   Procedure: CARDIOVERSION;  Surgeon: Thayer Headings, MD;  Location: [facility] ENDOSCOPY;  Service: Cardiovascular;  Laterality: N/A;  . COLONOSCOPY  2016  . CYST EXCISION     polynomial  . CYSTOSCOPY WITH INSERTION OF UROLIFT N/A 04/18/2018   Procedure: CYSTOSCOPY WITH INSERTION OF UROLIFT;  Surgeon: Cleon Gustin, MD;  Location: [facility];  Service: Urology;  Laterality: N/A;  . KNEE SURGERY Right 2000   arthroscopy  . RIGHT/LEFT HEART CATH AND CORONARY ANGIOGRAPHY N/A 01/30/2019   Procedure: RIGHT/LEFT HEART CATH AND CORONARY ANGIOGRAPHY;  Surgeon: [name], Peter  M, MD;  Location: [facility];  Service: Cardiovascular;  Laterality: N/A;  . TONSILLECTOMY    . widson teeth extraction      Family History  Problem Relation Age of Onset  . Aneurysm Father   . Heart disease Father   . Varicose Veins Mother   . Arthritis Mother   . Macular degeneration Mother   . Cancer Neg Hx   . Colon cancer Neg Hx   . Esophageal cancer Neg Hx   . Rectal cancer Neg Hx   . Stomach cancer Neg Hx     Social History   Socioeconomic History  . Marital status: Single    Spouse name: Not on file  . Number of children: Not on file  . Years of education: Not on file  . Highest education level: Not on file  Occupational History  . Not on file  Social Needs  . Financial resource strain: Not on file  . Food insecurity    Worry: Not on file    Inability: Not on file  . Transportation needs    Medical: Not on file    Non-medical: Not on file  Tobacco Use  . Smoking status: Former Smoker    Packs/day: 1.50    Years: 5.00    Pack years: 7.50    Types: Cigarettes    Quit date: 06/15/1994    Years since quitting: 24.5  . Smokeless tobacco: Never Used  Substance and Sexual Activity  . Alcohol use: Yes    Alcohol/week: 14.0 standard drinks    Types: 14 Shots of liquor per week    Comment: 1 drink of liquor per day  . Drug use: Yes    Types: Marijuana    Comment: occasionally marijuana  . Sexual activity: Yes    Partners: Female    Birth control/protection: Condom    Comment: condom use most of the time but not always  Social History Narrative   [place] and [employer]. married '72- 3 years/divorced; married '89- 3 years/divorced;. married '03 - 3 years/divorced. 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    Social History   Tobacco Use  Smoking Status Former Smoker  . Packs/day: 1.50  . Years: 5.00  . Pack years: 7.50  . Types: Cigarettes  . Quit date: 06/15/1994  . Years since quitting: 25.2  Smokeless Tobacco Never Used    Social History    Substance and Sexual Activity  Alcohol Use Yes  . Alcohol/week: 14.0 standard drinks  . Types: 14 Shots of liquor per week   Comment: 1 drink of liquor per day     Allergies  Allergen Reactions  . Quinolones Other (See Comments)    Patient was warned about not using Cipro and similar antibiotics. Recent studies have raised concern that fluoroquinolone antibiotics could be associated with an increased risk of aortic aneurysm Fluoroquinolones have non-antimicrobial properties that might jeopardise the integrity of the extracellular matrix of the vascular wall In a  propensity score matched cohort study in [place], there was a 66% increased rate of aortic aneurysm or dissection associated with oral fluoroquinolone use, compared wit    Current Outpatient Medications  Medication Sig Dispense Refill  . alfuzosin (UROXATRAL) 10 MG 24 hr tablet Take 1 tablet (10 mg total) by mouth daily with breakfast. 90 tablet 1  . amiodarone (PACERONE) 200 MG tablet Take 1 tablet (200 mg total) by mouth daily. 90 tablet 2  . APPLE CIDER VINEGAR PO Take 2 tablets by mouth daily.    Marland Kitchen atorvastatin (LIPITOR) 20 MG tablet TAKE 1 TABLET BY MOUTH EVERYDAY AT BEDTIME 90 tablet 1  . Empagliflozin-metFORMIN HCl ER (SYNJARDY XR) 25-1000 MG TB24 Take 1 tablet by mouth daily. 90 tablet 1  . GARLIC PO Take 2 capsules by mouth daily.    . hydrocortisone cream 1 % Apply 1 application topically daily as needed (rash).    Marland Kitchen losartan (COZAAR) 25 MG tablet TAKE 2 TABLETS BY MOUTH EVERY DAY 180 tablet 2  . Menthol-Methyl Salicylate (SALONPAS PAIN RELIEF PATCH EX) Apply 1 patch topically daily as needed (back pain).    . metoprolol succinate (TOPROL-XL) 25 MG 24 hr tablet TAKE 1 TABLET BY MOUTH EVERY DAY 90 tablet 2  . Revefenacin (YUPELRI) 175 MCG/3ML SOLN Inhale 1 puff into the lungs daily. 90 mL 5  . spironolactone (ALDACTONE) 25 MG tablet TAKE 0.5 TABLETS (12.5 MG TOTAL) BY MOUTH AT BEDTIME. 45 tablet 0  . Tiotropium  Bromide Monohydrate (SPIRIVA RESPIMAT) 1.25 MCG/ACT AERS Inhale 2 puffs into the lungs daily. 12 g 1  . triazolam (HALCION) 0.25 MG tablet Take 1 tablet (0.25 mg total) by mouth at bedtime as needed for sleep. 30 tablet 3  . TURMERIC PO Take 2 tablets by mouth daily.    Alveda Reasons 20 MG TABS tablet TAKE 1 TABLET (20 MG TOTAL) BY MOUTH DAILY WITH SUPPER. 90 tablet 1   No current facility-administered medications for this visit.    Pertinent items are noted in HPI.   Review of Systems:  Review of Systems  Constitutional: Positive for malaise/fatigue.  HENT: Negative.   Eyes: Negative.   Respiratory: Positive for shortness of breath. Negative for cough, hemoptysis, sputum production and wheezing.   Cardiovascular: Positive for palpitations, orthopnea and leg swelling. Negative for chest pain, claudication and PND.  Gastrointestinal: Negative.   Genitourinary: Negative.   Musculoskeletal: Positive for myalgias. Negative for back pain, falls, joint pain and neck pain.  Skin: Negative.   Neurological: Negative.   Endo/Heme/Allergies: Negative for environmental allergies and polydipsia. Bruises/bleeds easily.  Psychiatric/Behavioral: Negative.        Physical Exam: BP (!) 114/59 (BP Location: Left Arm, Patient Position: Sitting, Cuff Size: Large)   Pulse (!) 57   Temp 97.7 F (36.5 C)   Resp 16   Ht 6\' 5"  (1.956 Black)   Wt 297 lb (134.7 kg)   SpO2 96% Comment: ON RA  BMI 35.22 kg/Black  General appearance: alert, cooperative and no distress Head: Normocephalic, without obvious abnormality, atraumatic Neck: no adenopathy, no carotid bruit, no JVD, supple, symmetrical, trachea midline and thyroid not enlarged, symmetric, no tenderness/mass/nodules Lymph nodes: Cervical, supraclavicular, and axillary nodes normal. Resp: clear to auscultation bilaterally Cardio: regular rate and rhythm and diastolic murmur: mid diastolic 2/6, decrescendo at 2nd left intercostal space GI: soft, non-tender;  bowel sounds normal; no masses,  no organomegaly Extremities: extremities normal, atraumatic, no cyanosis or edema and Homans sign is negative, no sign of DVT Neurologic: Grossly normal        Recent Radiology Findings:  CT ANGIO CHEST AORTA W/CM & OR WO/CM  Result Date: 09/14/2019 CLINICAL DATA:  Follow-up thoracic aortic aneurysm EXAM: CT ANGIOGRAPHY CHEST WITH CONTRAST TECHNIQUE: Multidetector CT imaging of the chest was performed using the standard protocol during bolus administration of intravenous contrast. Multiplanar CT image reconstructions and MIPs were obtained to evaluate the vascular anatomy. CONTRAST:  35mL ISOVUE-370 IOPAMIDOL (ISOVUE-370) INJECTION 76% COMPARISON:  Chest CTA 12/15/2018, included portion from coronary CTA 04/03/2019 FINDINGS: Cardiovascular: Fusiform aneurysmal dilatation of the proximal ascending aorta measuring 5.3 cm greatest dimension. This measured 5.2 cm on October 2020 exam, and 5 cm on July 2020 exam. Distal aspect of the ascending aorta measures 4.4 cm (series 10, image 82. Normal caliber of the transverse aorta. Slight increase enlargement of the distal arch and descending aorta, 4.3 cm, series 5, image 47, previously 4.1 cm. Proximal descending aorta measures 4.5 cm, series 12, image 146. Distal descending aorta at the level of the left atrium measures 3.8 cm. The aorta at the diaphragmatic hiatus measures 3.7 cm. No dissection or perivascular stranding. Moderate calcified and noncalcified atheromatous plaque. Conventional branching pattern from the aortic arch. Brachiocephalic artery is tortuous. Heart is normal in size. There are coronary artery calcifications. Minimal pericardial effusion measuring up to 9 mm in depth adjacent to the right ventricle. Mediastinum/Nodes: No enlarged mediastinal or hilar lymph nodes. Multiple small mediastinal lymph nodes are similar to prior and likely reactive. No visualized thyroid nodule. No definite esophageal wall thickening.  Lungs/Pleura: Minor linear/compressive atelectasis in the left lower lobe. Unchanged nonspecific ground-glass opacity in the superior most segment of the right lower lobe. Stable perifissural nodule in the left lower lobe series 7, image 70. Stable pleural-based 6 mm nodule in the right lower lobe series 7, image 85. No new pulmonary nodule. No confluent airspace disease. Trachea and mainstem bronchi are patent. No pleural fluid. No evidence of pulmonary edema. Upper Abdomen: No acute findings. Atherosclerosis of upper abdominal aorta. Musculoskeletal: There are no acute or suspicious osseous abnormalities. 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 Review of the MIP images confirms the above findings. IMPRESSION: 1. Fusiform ascending thoracic aortic aneurysm measures 5.3 cm greatest dimension, previously 5.2 cm on October 2020 exam. Slight increase enlargement of the distal arch and descending aorta, 4.3 cm, previously 4.1 cm. No dissection or acute aortic abnormality. Aortic aneurysm NOS (ICD10-I71.9) 2. Stable pulmonary nodules. 3. Coronary artery calcifications. Aortic Atherosclerosis (ICD10-I70.0). Electronically Signed   By: Keith Rake Black.D.   On: 09/14/2019 12:31     ECHOCARDIOGRAM COMPLETE  Result Date: 07/29/2019    ECHOCARDIOGRAM REPORT   Patient Name:   Clinton Black Date of Exam: 07/28/2019 Medical Rec #:  [id number]       Height:       77.0 in Accession #:    [id number]      Weight:  296.0 lb Date of Birth:  [birth year]      BSA:          2.64 Black Patient Age:    54 years        BP:           134/78 mmHg Patient Gender: Black               HR:           79 bpm. Exam Location:  [facility] Procedure: 2D Echo                                MODIFIED REPORT: This report was modified by Cherlynn Kaiser MD on 07/29/2019 due to corrections.  Indications:     Atrial Fibrillation 427.31 / I48.91  History:         Patient has prior history of  Echocardiogram examinations, most                  recent 12/28/2018. CHF, Arrythmias:Atrial Fibrillation; Risk                  Factors:Hypertension, Diabetes and Dyslipidemia. Thoracic                  aortic aneurysm.  Sonographer:     Clayton Lefort RDCS (AE) Referring Phys:  [id number] PETER Black [name] Diagnosing Phys: Cherlynn Kaiser MD  Sonographer Comments: Image acquisition challenging due to respiratory motion. IMPRESSIONS  1. Left ventricular ejection fraction, by estimation, is 60%. The left ventricle has normal function. The left ventricle has no regional wall motion abnormalities. The left ventricular internal cavity size was mildly dilated. There is mildly increased left ventricular hypertrophy. Left ventricular diastolic parameters are indeterminate.  2. Right ventricular systolic function is normal. The right ventricular size is normal. Tricuspid regurgitation signal is inadequate for assessing PA pressure.  3. The mitral valve is normal in structure and function. No evidence of mitral valve regurgitation. No evidence of mitral stenosis.  4. The aortic valve is grossly normal. Aortic valve regurgitation is moderate. No aortic stenosis is present.  5. Aortic dilatation noted. There is moderate to severe dilatation of the ascending aorta measuring 53 mm.  6. The inferior vena cava is normal in size with greater than 50% respiratory variability, suggesting right atrial pressure of 3 mmHg. Comparison(s): A prior study was performed on 12/28/2018. Prior images reviewed side by side. Side by side measurements performed. When measured with a similar technique, LV cavity measures 59-61 mm in diastole and 47 mm in systole. LV function is preserved. Ascending aorta measurement has not significantly changed when measured in a similar location, and in comparison to CT PV study 04/03/2019 may not have significantly changed. FINDINGS  Left Ventricle: Left ventricular ejection fraction, by estimation, is 60%. The left ventricle  has normal function. The left ventricle has no regional wall motion abnormalities. The left ventricular internal cavity size was mildly dilated. There is mildly increased left ventricular hypertrophy. Left ventricular diastolic parameters are indeterminate. Right Ventricle: The right ventricular size is normal. No increase in right ventricular wall thickness. Right ventricular systolic function is normal. Tricuspid regurgitation signal is inadequate for assessing PA pressure. Left Atrium: Left atrial size was normal in size. Right Atrium: Right atrial size was normal in size. Pericardium: There is no evidence of pericardial effusion. Presence of large anterior epicardial fat pad. Mitral Valve: The mitral valve is normal  in structure and function. Normal mobility of the mitral valve leaflets. No evidence of mitral valve regurgitation. No evidence of mitral valve stenosis. Tricuspid Valve: The tricuspid valve is normal in structure. Tricuspid valve regurgitation is trivial. No evidence of tricuspid stenosis. Aortic Valve: The aortic valve is grossly normal. Aortic valve regurgitation is moderate. Aortic regurgitation PHT measures 622 msec. No aortic stenosis is present. Aortic valve mean gradient measures 4.0 mmHg. Aortic valve peak gradient measures 6.7 mmHg. Aortic valve area, by VTI measures 2.80 cm. Pulmonic Valve: The pulmonic valve was normal in structure. Pulmonic valve regurgitation is trivial. No evidence of pulmonic stenosis. Aorta: Aortic dilatation noted. There is moderate to severe dilatation of the ascending aorta measuring 53 mm. Venous: The inferior vena cava is normal in size with greater than 50% respiratory variability, suggesting right atrial pressure of 3 mmHg. IAS/Shunts: No atrial level shunt detected by color flow Doppler.  LEFT VENTRICLE PLAX 2D LVIDd:         6.10 cm LVIDs:         4.70 cm LV PW:         1.34 cm LV IVS:        1.25 cm LVOT diam:     2.10 cm LV SV:         90.40 ml LV SV  Index:   30.87 LVOT Area:     3.46 cm  RIGHT VENTRICLE            IVC RV Basal diam:  2.76 cm    IVC diam: 1.74 cm RV S prime:     9.53 cm/s TAPSE (Black-mode): 2.7 cm LEFT ATRIUM           Index       RIGHT ATRIUM           Index LA diam:      3.10 cm 1.17 cm/Black  RA Area:     13.00 cm LA Vol (A2C): 68.0 ml 25.73 ml/Black RA Volume:   27.20 ml  10.29 ml/Black LA Vol (A4C): 63.6 ml 24.07 ml/Black  AORTIC VALVE AV Area (Vmax):    3.01 cm AV Area (Vmean):   2.88 cm AV Area (VTI):     2.80 cm AV Vmax:           129.00 cm/s AV Vmean:          89.300 cm/s AV VTI:            0.323 Black AV Peak Grad:      6.7 mmHg AV Mean Grad:      4.0 mmHg LVOT Vmax:         112.00 cm/s LVOT Vmean:        74.300 cm/s LVOT VTI:          0.261 Black LVOT/AV VTI ratio: 0.81 AI PHT:            622 msec  AORTA Ao Root diam: 3.50 cm Ao Asc diam:  5.30 cm  SHUNTS Systemic VTI:  0.26 Black Systemic Diam: 2.10 cm Cherlynn Kaiser MD Electronically signed by Cherlynn Kaiser MD Signature Date/Time: 07/29/2019/10:41:43 AM    Final (Updated)     Ct Angio Chest Aorta W &/or Wo Contrast  Result Date: 12/15/2018 CLINICAL DATA:  Follow-up TAA EXAM: CT ANGIOGRAPHY CHEST WITH CONTRAST TECHNIQUE: Multidetector CT imaging of the chest was performed using the standard protocol during bolus administration of intravenous contrast. Multiplanar CT image reconstructions and MIPs were obtained to evaluate the  vascular anatomy. CONTRAST:  10mL ISOVUE-370 IOPAMIDOL (ISOVUE-370) INJECTION 76% COMPARISON:  11/04/2017, 02/26/2017 FINDINGS: Cardiovascular: Preferential opacification of the thoracic aorta. Unchanged enlargement of the tubular thoracic aorta, measuring 5.0 x 4.8 cm. The aortic root and sinuses of Valsalva are normal in caliber. Unchanged enlargement of the distal arch and descending thoracic aorta measuring 4.1 x 4.1 cm in the midportion of the descending aorta. Moderate mixed calcific atherosclerosis. Normal heart size. Left coronary artery calcifications. No pericardial  effusion. Mediastinum/Nodes: No enlarged mediastinal, hilar, or axillary lymph nodes. Thyroid gland, trachea, and esophagus demonstrate no significant findings. Lungs/Pleura: Scattered nonspecific ground-glass opacity of the superior segment right lower lobe. Stable, small benign pulmonary nodules, for example a 3 mm fissural nodule of the superior segment left lower lobe (series 7, image 72). No pleural effusion or pneumothorax. Upper Abdomen: No acute abnormality. 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 No acute or significant osseous findings. Review of the MIP images confirms the above findings. IMPRESSION: 1. Unchanged enlargement of the tubular thoracic aorta, measuring 5.0 x 4.8 cm. The aortic root and sinuses of Valsalva are normal in caliber. Unchanged enlargement of the distal arch and descending thoracic aorta measuring 4.1 x 4.1 cm in the midportion of the descending aorta. These findings are not significantly changed when compared to examinations dating back to 02/26/2017 and measured similarly. Moderate mixed calcific atherosclerosis. 2.  Coronary artery disease. 3. Scattered nonspecific ground-glass opacity of the superior segment right lower lobe, likely infectious or inflammatory. 4. Stable, small benign pulmonary nodules, for example a 3 mm fissural nodule of the superior segment left lower lobe (series 7, image 72). Electronically Signed   By: Eddie Candle Black.D.   On: 12/15/2018 11:04      Ct Angio Chest Aorta W/cm &/or Wo/cm  Result Date: 11/04/2017 CLINICAL DATA:  Thoracic aortic aneurysm EXAM: CT ANGIOGRAPHY CHEST WITH CONTRAST TECHNIQUE: Multidetector CT imaging of the chest was performed using the standard protocol during bolus administration of intravenous contrast. Multiplanar CT image reconstructions and MIPs were obtained to evaluate the vascular anatomy. CONTRAST:  28mL ISOVUE-370 IOPAMIDOL (ISOVUE-370) INJECTION 76% COMPARISON:  02/26/2017 FINDINGS:  Cardiovascular: Maximal diameters of the ascending aorta at the sinus of Valsalva, sino-tubular junction, and ascending aorta are 3.9 cm, 3.8 cm, and 5.2 cm respectively. Based on my direct measurements on the prior study, this is not significantly changed. There is no evidence of aortic dissection. There is no obvious intramural hematoma. Maximal diameter of the proximal descending thoracic aorta is 4.3 cm. The aortic arch is nonaneurysmal. Atherosclerotic calcifications in the aortic arch. Minimal atherosclerotic calcifications in the descending thoracic aorta. Great vessels are patent. Right subclavian artery is ectatic with a maximal diameter of 2.0 cm. Mild LAD territory coronary artery calcifications. No obvious acute pulmonary thromboembolism. Mediastinum/Nodes: No abnormal adenopathy. No pericardial effusion. Visualized thyroid is atrophic. Mild diffuse wall thickening of the esophagus. Lungs/Pleura: No pneumothorax. No pleural effusion. Pleural base nodule in the lateral right lower lobe on image 92 of series 6 measures 7 mm. This is stable. Upper Abdomen: No acute abnormality. Musculoskeletal: No vertebral compression deformity. Review of the MIP images confirms the above findings. IMPRESSION: Aneurysmal dilatation of the ascending aorta is 5.2 cm. Based on my direct measurements of the prior study, this has not significantly changed. There is no evidence of dissection. Mild aneurysmal dilatation of the descending thoracic aorta at 4.3 cm is also not significantly changed. Ascending thoracic aortic aneurysm. Recommend semi-annual imaging followup by CTA or  MRA and referral to cardiothoracic surgery if not already obtained. This recommendation follows 2010 ACCF/AHA/AATS/ACR/ASA/SCA/SCAI/SIR/STS/SVM Guidelines for the Diagnosis and Management of Patients With Thoracic Aortic Disease. Circulation. 2010; 121: [id number] Aortic Atherosclerosis (ICD10-I70.0). Electronically Signed   By: Marybelle Killings Black.D.   On:  11/04/2017 09:05   CLINICAL DATA:  Aortic root dilatation.  EXAM: CT ANGIOGRAPHY CHEST WITH CONTRAST  TECHNIQUE: Multidetector CT imaging of the chest was performed using the standard protocol during bolus administration of intravenous contrast. Multiplanar CT image reconstructions and MIPs were obtained to evaluate the vascular anatomy.  CONTRAST:  100 mL of Isovue 370 intravenously.  COMPARISON:  None.  FINDINGS: Cardiovascular: Aortic root measures 3.9 cm in diameter. 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 Aneurysmal dilatation of descending thoracic aorta is noted with maximum measured diameter 4.1 cm. Aortic atherosclerosis is noted without dissection. Great vessels are widely patent without stenosis. No pericardial effusion is noted.  Mediastinum/Nodes: No enlarged mediastinal, hilar, or axillary lymph nodes. Thyroid gland, trachea, and esophagus demonstrate no significant findings.  Lungs/Pleura: Lungs are clear. No pleural effusion or pneumothorax.  Upper Abdomen: Probable fatty infiltration of the liver is noted.  Musculoskeletal: 8.6 x 5.3 cm fat containing lesion is seen within the right pectoralis muscle most consistent with intramuscular lipoma.  Review of the MIP images confirms the above findings.  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 Recommend semi-annual imaging followup by CTA or MRA and referral to cardiothoracic surgery if not already obtained. This recommendation follows 2010 ACCF/AHA/AATS/ACR/ASA/SCA/SCAI/SIR/STS/SVM Guidelines for the Diagnosis and Management of Patients With Thoracic Aortic Disease. Circulation. 2010; [id number].  Probable fatty infiltration of the liver.  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  Aortic Atherosclerosis (ICD10-I70.0).   Electronically Signed   By: Marijo Conception, Black.D.    On: 02/26/2017 11:57  I have independently reviewed the above radiologic studies.  Recent Lab Findings: Lab Results  Component Value Date   WBC 8.3 09/06/2019   HGB 14.3 09/06/2019   HCT 43.4 09/06/2019   PLT 188.0 09/06/2019   GLUCOSE 106 (H) 09/06/2019   CHOL 148 05/29/2019   TRIG 255.0 (H) 05/29/2019   HDL 34.00 (L) 05/29/2019   LDLDIRECT 78.0 05/29/2019   LDLCALC 48 06/01/2018   ALT 21 06/01/2018   AST 15 06/01/2018   NA 140 09/06/2019   K 4.6 09/06/2019   CL 106 09/06/2019   CREATININE 1.05 09/06/2019   BUN 19 09/06/2019   CO2 27 09/06/2019   TSH 4.29 09/06/2019   HGBA1C 6.6 (H) 09/06/2019    Study Highlights    The left ventricular ejection fraction is moderately decreased (30-44%).  Nuclear stress EF: 39%.  There was no ST segment deviation noted during stress.  No T wave inversion was noted during stress.  This is an intermediate risk study due to reduced systolic function.  There is no ischemia.    Nuclear History and Indications   History and Indications Indication for Stress Test: Diagnosis of coronary disease History: Hx Acute Bronchitis; No prior cardiac history reported; No prior NUC MPI for comparison. Cardiac Risk Factors: Family History - CAD, History of Smoking, Hypertension, Lipids and Obesity  Symptoms: Dizziness, DOE, Fatigue and SOB  Stress Findings   ECG Baseline ECG exhibits normal sinus rhythm..  Stress Findings A pharmacological stress test was performed using IV Lexiscan 0.4mg  over 10 seconds performed without concurrent submaximal exercise.  The patient reported  shortness of breath during the stress test.   Test was stopped per protocol.  Response to Stress There was no ST segment deviation noted during stress.  No T wave inversion was noted during stress. Arrhythmias during stress: frequent PVCs.  Arrhythmias during recovery: none.  Arrhythmias were not significant.  ECG was interpretable and there was no significant change from  baseline.  Stress Measurements   Baseline Vitals  Rest HR 63 bpm    Rest BP 141/67 mmHg    Peak Stress Vitals  Peak HR 74 bpm    Peak BP 136/69 mmHg       Nuclear Stress Measurements   LV sys vol 143 mL    TID 0.98     LV dias vol 234 mL    SSS 0     SRS 0     SDS 0          Nuclear Stress Findings   Isotope administration Rest isotope was administered with an IV injection of 31.3 mCi technetium tetrofosmin. Rest SPECT images were obtained approximately 45 minutes post tracer injection. Stress isotope was administered with an IV injection of 29.6 mCi technetium tetrofosmin 20 seconds post IV Lexiscan administration. Stress SPECT images were obtained approximately 60 minutes post tracer injection.  Nuclear Study Quality Overall image quality is good.  Nuclear Measurements Study was gated.  Rest Perfusion Rest perfusion normal.  Stress Perfusion Stress perfusion normal.  Overall Study Impression Myocardial perfusion is normal. This is an intermediate risk study. Overall left ventricular systolic function was abnormal. LV cavity size is moderately enlarged. Nuclear stress EF: 39%. The left ventricular ejection fraction is moderately decreased (30-44%). There is no prior study for comparison.  From: ACCF/SCAI/STS/AATS/AHA/ASNC/HFSA/SCCT 2012 Appropriate Use Criteria for Coronary Revascularization Focused Update  Wall Scoring   Score Index: 2.000 Percent Normal: 0.0%          The left ventricular wall motion is globally hypokinetic.          Signed   Electronically signed by Skeet Latch, MD on 02/16/17 at [facility] EDT  Report approved and finalized on 02/16/2017 1918   Cardiac Cath:  LV end diastolic pressure is normal.  There is moderate (3+) aortic regurgitation.   1. Normal coronary anatomy. Left dominant circulation 2. Normal right heart pressures 3. Normal cardiac output.  4. Moderate aortic insufficiency. No significant stenosis 5. Ascending thoracic  aortic aneurysm.   Plan: will review with Dr Servando Snare. If Aortic surgery is not planned in the near future would recommend referral to EP for consideration of ablation of Afib.    Aortic Size Index=  5.0      /Body surface area is 2.71 meters squared. 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  We discussed the pros and cons of surgical intervention including the expected postoperative course time to recover .  As noted he does supply a significant amount of care to his fiance .  He will consider our options discussed with his family their help and support during the postoperative period he will return in 2 weeks to further discuss plan for surgery    [name] MD      [address] [place] [address] Office [phone]   Beeper [phone]  09/14/2019 3:59 PM

## 2019-09-16 NOTE — Progress Notes (Signed)
Cardiology Office Note    Date:  09/19/2019   ID:  Clinton Black, Clinton Black September 15, 1945, MRN [id number]  PCP:  [name], MD  Cardiologist: Dr. [name]  Chief Complaint  Patient presents with  . Congestive Heart Failure  . Atrial Fibrillation    History of Present Illness:  Clinton Black is a 74 y.o. male with PMH of DM II, HTN, HLD, TAA, morbid obesity, and persistent A. fib.  Myoview obtained on 02/12/2017 showed EF 39%, no ischemia, intermediate risk study due to reduced systolic function.  Echocardiogram obtained showed normal EF 55%.  Previous echocardiogram in October 2019 showed EF 50 to 55%, grade 1 DD, dilated thoracic aorta measuring 5 cm, moderate AI.  He was diagnosed with atrial fibrillation in December 2019 and was started on Xarelto.  He underwent successful DC cardioversion on 07/12/2018.  He felt better after the cardioversion however quickly went back into A. fib.  He was seen in the A. fib clinic and was loaded with amiodarone therapy.  Repeat attempt at DC cardioversion in March was unsuccessful.  Amiodarone was subsequently discontinued.  Options include Tikosyn and amiodarone were discussed.  He was seen back in the ED near the end of March was facial droop secondary to Bell's palsy.  MRI was negative for CVA.  Cardiac catheterization performed 03/02/2019 showed normal coronary anatomy, moderate aortic regurgitation. He was seen by Dr. Curt Bears in September. He did undergo successful Afib ablation on 04/07/19 by Dr Curt Bears. He notes that he immediately felt better after the procedure and breathing was improved. He had follow up in the Afib clinic in Dec and had recurrent Afib and placed back on amiodarone. Underwent DCCV on 06/28/19. Seen by Dr Curt Bears and plan to continue amiodarone for 3 months and follow.   He was recently seen by Dr Servando Snare. Thoracic aneurysm increased in size to 5.3 cm. Patient notes persistent symptoms of SOB despite being in NSR. Gets SOB just getting  up out of a chair. Denies palpitations. No edema. Weight is stable. BP controlled.   Past Medical History:  Diagnosis Date  . Arthritis   . BPH (benign prostatic hyperplasia)   . Diabetes mellitus type 2 in obese (HCC)    diet controlled  . HTN (hypertension)   . Hyperlipidemia, mixed   . Insomnia   . Obesity   . Thoracic aortic aneurysm, without rupture (HCC)    4.7 cm per chest ct with contrast 11-04-17    Past Surgical History:  Procedure Laterality Date  . ATRIAL FIBRILLATION ABLATION N/A 04/07/2019   Procedure: ATRIAL FIBRILLATION ABLATION;  Surgeon: [name], MD;  Location: [facility];  Service: Cardiovascular;  Laterality: N/A;  . CARDIOVERSION N/A 07/12/2018   Procedure: CARDIOVERSION;  Surgeon: Thayer Headings, MD;  Location: [facility] ENDOSCOPY;  Service: Cardiovascular;  Laterality: N/A;  . CARDIOVERSION N/A 08/22/2018   Procedure: CARDIOVERSION;  Surgeon: Skeet Latch, MD;  Location: [facility];  Service: Cardiovascular;  Laterality: N/A;  . CARDIOVERSION N/A 06/28/2019   Procedure: CARDIOVERSION;  Surgeon: Thayer Headings, MD;  Location: [facility] ENDOSCOPY;  Service: Cardiovascular;  Laterality: N/A;  . COLONOSCOPY  2016  . CYST EXCISION     polynomial  . CYSTOSCOPY WITH INSERTION OF UROLIFT N/A 04/18/2018   Procedure: CYSTOSCOPY WITH INSERTION OF UROLIFT;  Surgeon: Cleon Gustin, MD;  Location: [facility];  Service: Urology;  Laterality: N/A;  . KNEE SURGERY Right 2000   arthroscopy  . RIGHT/LEFT  HEART CATH AND CORONARY ANGIOGRAPHY N/A 01/30/2019   Procedure: RIGHT/LEFT HEART CATH AND CORONARY ANGIOGRAPHY;  Surgeon: [name],  M, MD;  Location: [facility];  Service: Cardiovascular;  Laterality: N/A;  . TONSILLECTOMY    . widson teeth extraction      Current Medications: Outpatient Medications Prior to Visit  Medication Sig Dispense Refill  . alfuzosin (UROXATRAL) 10 MG 24 hr tablet Take 1 tablet (10 mg total) by mouth  daily with breakfast. 90 tablet 1  . amiodarone (PACERONE) 200 MG tablet Take 1 tablet (200 mg total) by mouth daily. 90 tablet 2  . APPLE CIDER VINEGAR PO Take 2 tablets by mouth daily.    Marland Kitchen atorvastatin (LIPITOR) 20 MG tablet TAKE 1 TABLET BY MOUTH EVERYDAY AT BEDTIME 90 tablet 1  . Empagliflozin-metFORMIN HCl ER (SYNJARDY XR) 25-1000 MG TB24 Take 1 tablet by mouth daily. 90 tablet 1  . GARLIC PO Take 2 capsules by mouth daily.    . hydrocortisone cream 1 % Apply 1 application topically daily as needed (rash).    Marland Kitchen losartan (COZAAR) 25 MG tablet TAKE 2 TABLETS BY MOUTH EVERY DAY 180 tablet 2  . Menthol-Methyl Salicylate (SALONPAS PAIN RELIEF PATCH EX) Apply 1 patch topically daily as needed (back pain).    . metoprolol succinate (TOPROL-XL) 25 MG 24 hr tablet TAKE 1 TABLET BY MOUTH EVERY DAY 90 tablet 2  . Revefenacin (YUPELRI) 175 MCG/3ML SOLN Inhale 1 puff into the lungs daily. 90 mL 5  . spironolactone (ALDACTONE) 25 MG tablet TAKE 0.5 TABLETS (12.5 MG TOTAL) BY MOUTH AT BEDTIME. 45 tablet 0  . Tiotropium Bromide Monohydrate (SPIRIVA RESPIMAT) 1.25 MCG/ACT AERS Inhale 2 puffs into the lungs daily. 12 g 1  . triazolam (HALCION) 0.25 MG tablet Take 1 tablet (0.25 mg total) by mouth at bedtime as needed for sleep. 30 tablet 3  . TURMERIC PO Take 2 tablets by mouth daily.    Alveda Reasons 20 MG TABS tablet TAKE 1 TABLET (20 MG TOTAL) BY MOUTH DAILY WITH SUPPER. 90 tablet 1   No facility-administered medications prior to visit.     Allergies:   Quinolones   Social History   Socioeconomic History  . Marital status: Single    Spouse name: Not on file  . Number of children: Not on file  . Years of education: Not on file  . Highest education level: Not on file  Occupational History  . Not on file  Tobacco Use  . Smoking status: Former Smoker    Packs/day: 1.50    Years: 5.00    Pack years: 7.50    Types: Cigarettes    Quit date: 06/15/1994    Years since quitting: 25.2  . Smokeless  tobacco: Never Used  Substance and Sexual Activity  . Alcohol use: Yes    Alcohol/week: 14.0 standard drinks    Types: 14 Shots of liquor per week    Comment: 1 drink of liquor per day  . Drug use: Yes    Types: Marijuana    Comment: occasionally marijuana  . Sexual activity: Yes    Partners: Female    Birth control/protection: Condom    Comment: condom use most of the time but not always  Other Topics Concern  . Not on file  [place] - [employer] and [employer]. married '72- 3 years/divorced; married '89- 3 years/divorced;. married '03 - 3 years/divorced. 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   Social Determinants of  Health   Financial Resource Strain:   . Difficulty of Paying Living Expenses:   Food Insecurity:   . Worried About Charity fundraiser in the Last Year:   . Arboriculturist in the Last Year:   Transportation Needs:   . Film/video editor (Medical):   Marland Kitchen Lack of Transportation (Non-Medical):   Physical Activity:   . Days of Exercise per Week:   . Minutes of Exercise per Session:   Stress:   . Feeling of Stress :   Social Connections:   . Frequency of Communication with Friends and Family:   . Frequency of Social Gatherings with Friends and Family:   . Attends Religious Services:   . Active Member of Clubs or Organizations:   . Attends Archivist Meetings:   Marland Kitchen Marital Status:      Family History:  The patient's family history includes Aneurysm in his father; Arthritis in his mother; Heart disease in his father; Macular degeneration in his mother; Varicose Veins in his mother.   ROS:   Please see the history of present illness.    ROS All other systems reviewed and are negative.   PHYSICAL EXAM:   VS:  BP 132/74   Pulse (!) 53   Ht 6\' 5"  (1.956 m)   Wt 297 lb 9.6 oz (135 kg)   SpO2 96%   BMI 35.29 kg/m    GEN: Well nourished, well developed, in no acute distress  HEENT: normal  Neck: no JVD, carotid bruits, or  masses Cardiac: Irregularly irregular; no murmurs, rubs, or gallops,no edema  Respiratory:  clear to auscultation bilaterally, normal work of breathing GI: soft, nontender, nondistended, + BS MS: no deformity or atrophy  Skin: warm and dry, no rash Neuro:  Alert and Oriented x 3, Strength and sensation are intact Psych: euthymic mood, full affect  Wt Readings from Last 3 Encounters:  09/19/19 297 lb 9.6 oz (135 kg)  09/14/19 297 lb (134.7 kg)  09/06/19 297 lb (134.7 kg)      Studies/Labs Reviewed:   EKG:  EKG is not ordered today.   Recent Labs: 05/29/2019: Pro B Natriuretic peptide (BNP) 95.0 09/06/2019: BUN 19; Creatinine, Ser 1.05; Hemoglobin 14.3; Platelets 188.0; Potassium 4.6; Sodium 140; TSH 4.29   Lipid Panel    Component Value Date/Time   CHOL 148 05/29/2019 1430   CHOL 105 03/11/2017 1541   TRIG 255.0 (H) 05/29/2019 1430   HDL 34.00 (L) 05/29/2019 1430   HDL 36 (L) 03/11/2017 1541   CHOLHDL 4 05/29/2019 1430   VLDL 51.0 (H) 05/29/2019 1430   LDLCALC 48 06/01/2018 1328   LDLCALC 46 03/11/2017 1541   LDLDIRECT 78.0 05/29/2019 1430    Additional studies/ records that were reviewed today include:   Echo 03/29/2018 LV EF: 50% -   55% Study Conclusions  - Left ventricle: The cavity size was normal. There was moderate   basal hypertrophy of the septum with otherwise mild concentric   hypertrophy. Systolic function was normal. The estimated ejection   fraction was in the range of 50% to 55%. Wall motion was normal;   there were no regional wall motion abnormalities. Doppler   parameters are consistent with abnormal left ventricular   relaxation (grade 1 diastolic dysfunction). - Aortic valve: Transvalvular velocity was within the normal range.   There was no stenosis. There was moderate regurgitation. - Aorta: Ascending aortic diameter: 50 mm (S). 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 -  Mitral valve: Transvalvular velocity was within  the normal range.   There was no evidence for stenosis. There was trivial   regurgitation. - Left atrium: The atrium was moderately dilated. - Right ventricle: The cavity size was normal. Wall thickness was   normal. Systolic function was normal. - Pulmonary arteries: Systolic pressure was within the normal   range. PA peak pressure: 24 mm Hg (S).  Impressions:  - Compared with the echo 05/2017, the ascending aorta aneurysm has   increased from 4.7 cm to 5.0 cm.    Cath [id number]  LV end diastolic pressure is normal.  There is moderate (3+) aortic regurgitation.   1. Normal coronary anatomy. Left dominant circulation 2. Normal right heart pressures 3. Normal cardiac output.  4. Moderate aortic insufficiency. No significant stenosis 5. Ascending thoracic aortic aneurysm.   Plan: will review with Dr Servando Snare. If Aortic surgery is not planned in the near future would recommend referral to EP for consideration of ablation of Afib.   Echo 07/28/19:IMPRESSIONS    1. Left ventricular ejection fraction, by estimation, is 60%. The left  ventricle has normal function. The left ventricle has no regional wall  motion abnormalities. The left ventricular internal cavity size was mildly  dilated. There is mildly increased  left ventricular hypertrophy. Left ventricular diastolic parameters are  indeterminate.  2. Right ventricular systolic function is normal. The right ventricular  size is normal. Tricuspid regurgitation signal is inadequate for assessing  PA pressure.  3. The mitral valve is normal in structure and function. No evidence of  mitral valve regurgitation. No evidence of mitral stenosis.  4. The aortic valve is grossly normal. Aortic valve regurgitation is  moderate. No aortic stenosis is present.  5. Aortic dilatation noted. 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  6. The inferior vena cava is normal in size with greater  than 50%  respiratory variability, suggesting right atrial pressure of 3 mmHg.     ASSESSMENT:    No diagnosis found.   PLAN:  In order of problems listed above:  1. Persistent atrial fibrillation: symptomatic. Failed DCCV on amiodarone. S/p Afib ablation on 04/07/19. Immediate improvement in symptoms. He did have recurrent AFib and was placed back on amiodarone. Seen by Dr Curt Bears and plan was to continue amiodarone for 3 months then consider discontinuation. Now plans for AVR and thoracic aneurysm repair. ? If surgical MAZE would be beneficial.   2. Thoracic aortic aneurysm: steady increase in size from 4.7 to 5.3.  Moderate aortic regurgitation. He is symptomatic with persistent dyspnea class 3.   Plan AVR and Aortic grafting by Dr Servando Snare in next month or so.   3. Hypertension: Blood pressure stable  4. Hyperlipidemia: Continue Lipitor.  5. DM2: Managed by primary care provider  6. Morbid obesity: Weight loss is imperative.   Medication Adjustments/Labs and Tests Ordered: Current medicines are reviewed at length with the patient today.  Concerns regarding medicines are outlined above.  Medication changes, Labs and Tests ordered today are listed in the Patient Instructions below. There are no Patient Instructions on file for this visit.   Signed,  [name], MD  09/19/2019 12:11 PM    [facility] [place], [place], [place]  [address] Phone: [phone]; Fax: [phone]

## 2019-09-19 ENCOUNTER — Encounter: Payer: Self-pay | Admitting: Cardiology

## 2019-09-19 ENCOUNTER — Other Ambulatory Visit: Payer: Self-pay

## 2019-09-19 ENCOUNTER — Ambulatory Visit: Payer: Medicare HMO | Admitting: Cardiology

## 2019-09-19 VITALS — BP 132/74 | HR 53 | Ht 77.0 in | Wt 297.6 lb

## 2019-09-19 DIAGNOSIS — I10 Essential (primary) hypertension: Secondary | ICD-10-CM

## 2019-09-19 DIAGNOSIS — I712 Thoracic aortic aneurysm, without rupture, unspecified: Secondary | ICD-10-CM

## 2019-09-19 DIAGNOSIS — I5032 Chronic diastolic (congestive) heart failure: Secondary | ICD-10-CM | POA: Diagnosis not present

## 2019-09-19 DIAGNOSIS — I4819 Other persistent atrial fibrillation: Secondary | ICD-10-CM | POA: Diagnosis not present

## 2019-09-20 DIAGNOSIS — R972 Elevated prostate specific antigen [PSA]: Secondary | ICD-10-CM | POA: Diagnosis not present

## 2019-09-25 ENCOUNTER — Ambulatory Visit: Payer: Medicare HMO | Admitting: Cardiothoracic Surgery

## 2019-09-25 ENCOUNTER — Other Ambulatory Visit: Payer: Self-pay

## 2019-09-25 VITALS — BP 112/65 | HR 65 | Temp 97.7°F | Resp 20 | Ht 77.0 in | Wt 297.0 lb

## 2019-09-25 DIAGNOSIS — I712 Thoracic aortic aneurysm, without rupture, unspecified: Secondary | ICD-10-CM

## 2019-09-25 DIAGNOSIS — I351 Nonrheumatic aortic (valve) insufficiency: Secondary | ICD-10-CM | POA: Diagnosis not present

## 2019-09-25 NOTE — Progress Notes (Signed)
[address]       ,[place] [address]             [phone]                    Hassan P Derego Pitkin Medical Record [id number] Date of Birth: 1945/11/02  Referring: [name], [name], MD Primary Care: [name], MD  Chief Complaint:    Chief Complaint  Patient presents with  . Thoracic Aortic Aneurysm    Dilated ascending aorta, further discuss surgery    History of Present Illness:    Clinton Black 74 y.o. male followed in office for ascending aortic dilatation and aortic insufficiency.  He notes that just before coming to the office today his urologist Dr. Loel Lofty called him and noted that his PSA had been rising and was recommending a biopsy in the near future.   He is now status post AF ablation 04/07/2019.  Unfortunately he did go back into atrial fibrillation then noted by cardiac monitoring.  He is currently on amiodarone.  He had his cardioversion 06/28/2019.  Rhythm strip in the office today revealed the patient was in sinus rhythm at approximately 60   Patient has no family history of aortic dissection he does note that his father died at age 22 suddenly with a cerebral embolus mother died at age 7 he is had one sister died at age 11 of breast cancer and another sister has had breast cancer in atrial fib still alive patient has no children no family history of aortic dissections or aneurysm.  When initially the patient was converted to sinus rhythm he noted significant improvement in his overall symptoms.  Now in spite of being in sinus rhythm he notes increasing shortness of breath with exertion.  He is able to maintain usual activities around the house he does care for his fiance who has significant neuropathy and needs extensive care.    Current Activity/ Functional Status:  Patient is independent with mobility/ambulation, transfers, ADL's, IADL's.   Zubrod Score: At the time of surgery this patient's most appropriate activity  status/level should be described as: []     0    Normal activity, no symptoms []     1    Restricted in physical strenuous activity but ambulatory, able to do out light work [x]     2    Ambulatory and capable of self care, unable to do work activities, up and about               >50 % of waking hours                              []     3    Only limited self care, in bed greater than 50% of waking hours []     4    Completely disabled, no self care, confined to bed or chair []     5    Moribund   Past Medical History:  Diagnosis Date  . Arthritis   . BPH (benign prostatic hyperplasia)   . Diabetes mellitus type 2 in obese (HCC)    diet controlled  . HTN (hypertension)   . Hyperlipidemia, mixed   . Insomnia   . Obesity   . Thoracic aortic aneurysm, without rupture (HCC)    4.7 cm per chest ct with contrast 11-04-17    Past Surgical History:  Procedure Laterality Date  . ATRIAL FIBRILLATION ABLATION N/A 04/07/2019   Procedure: ATRIAL FIBRILLATION ABLATION;  Surgeon: [name], MD;  Location: [place];  Service: Cardiovascular;  Laterality: N/A;  . CARDIOVERSION N/A 07/12/2018   Procedure: CARDIOVERSION;  Surgeon: Thayer Headings, MD;  Location: [facility] ENDOSCOPY;  Service: Cardiovascular;  Laterality: N/A;  . CARDIOVERSION N/A 08/22/2018   Procedure: CARDIOVERSION;  Surgeon: Skeet Latch, MD;  Location: [place];  Service: Cardiovascular;  Laterality: N/A;  . CARDIOVERSION N/A 06/28/2019   Procedure: CARDIOVERSION;  Surgeon: Thayer Headings, MD;  Location: [facility] ENDOSCOPY;  Service: Cardiovascular;  Laterality: N/A;  . COLONOSCOPY  2016  . CYST EXCISION     polynomial  . CYSTOSCOPY WITH INSERTION OF UROLIFT N/A 04/18/2018   Procedure: CYSTOSCOPY WITH INSERTION OF UROLIFT;  Surgeon: Cleon Gustin, MD;  [facility]  Service: Urology;  Laterality: N/A;  . KNEE SURGERY Right 2000   arthroscopy  . RIGHT/LEFT HEART CATH AND CORONARY  ANGIOGRAPHY N/A 01/30/2019   Procedure: RIGHT/LEFT HEART CATH AND CORONARY ANGIOGRAPHY;  Surgeon: [name], [name], MD;  Location: [place] [facility];  Service: Cardiovascular;  Laterality: N/A;  . TONSILLECTOMY    . widson teeth extraction      Family History  Problem Relation Age of Onset  . Aneurysm Father   . Heart disease Father   . Varicose Veins Mother   . Arthritis Mother   . Macular degeneration Mother   . Cancer Neg Hx   . Colon cancer Neg Hx   . Esophageal cancer Neg Hx   . Rectal cancer Neg Hx   . Stomach cancer Neg Hx     Social History   Socioeconomic History  . Marital status: Single    Spouse name: Not on file  . Number of children: Not on file  . Years of education: Not on file  . Highest education level: Not on file  Occupational History  . Not on file  Social Needs  . Financial resource strain: Not on file  . Food insecurity    Worry: Not on file    Inability: Not on file  . Transportation needs    Medical: Not on file    Non-medical: Not on file  Tobacco Use  . Smoking status: Former Smoker    Packs/day: 1.50    Years: 5.00    Pack years: 7.50    Types: Cigarettes    Quit date: 06/15/1994    Years since quitting: 24.5  . Smokeless tobacco: Never Used  Substance and Sexual Activity  . Alcohol use: Yes    Alcohol/week: 14.0 standard drinks    Types: 14 Shots of liquor per week    Comment: 1 drink of liquor per day  . Drug use: Yes    Types: Marijuana    Comment: occasionally marijuana  . Sexual activity: Yes    Partners: Female    Birth control/protection: Condom    Comment: condom use most of the time but not always  Social History Narrative   [place] and [employer]. married '72- 3 years/divorced; married '89- 3 years/divorced;. married '03 - 3 years/divorced. 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    Social History   Tobacco Use  Smoking Status Former Smoker  . Packs/day: 1.50  . Years: 5.00  . Pack years: 7.50  . Types:  Cigarettes  . Quit date: 06/15/1994  . Years since quitting: 25.2  Smokeless Tobacco Never Used  Social History   Substance and Sexual Activity  Alcohol Use Yes  . Alcohol/week: 14.0 standard drinks  . Types: 14 Shots of liquor per week   Comment: 1 drink of liquor per day     Allergies  Allergen Reactions  . Quinolones Other (See Comments)    Patient was warned about not using Cipro and similar antibiotics. Recent studies have raised concern that fluoroquinolone antibiotics could be associated with an increased risk of aortic aneurysm Fluoroquinolones have non-antimicrobial properties that might jeopardise the integrity of the extracellular matrix of the vascular wall In a  propensity score matched cohort study in [place], there was a 66% increased rate of aortic aneurysm or dissection associated with oral fluoroquinolone use, compared wit    Current Outpatient Medications  Medication Sig Dispense Refill  . alfuzosin (UROXATRAL) 10 MG 24 hr tablet Take 1 tablet (10 mg total) by mouth daily with breakfast. 90 tablet 1  . amiodarone (PACERONE) 200 MG tablet Take 1 tablet (200 mg total) by mouth daily. 90 tablet 2  . APPLE CIDER VINEGAR PO Take 2 tablets by mouth daily.    Marland Kitchen atorvastatin (LIPITOR) 20 MG tablet TAKE 1 TABLET BY MOUTH EVERYDAY AT BEDTIME 90 tablet 1  . Empagliflozin-metFORMIN HCl ER (SYNJARDY XR) 25-1000 MG TB24 Take 1 tablet by mouth daily. 90 tablet 1  . GARLIC PO Take 2 capsules by mouth daily.    . hydrocortisone cream 1 % Apply 1 application topically daily as needed (rash).    Marland Kitchen losartan (COZAAR) 25 MG tablet TAKE 2 TABLETS BY MOUTH EVERY DAY 180 tablet 2  . Menthol-Methyl Salicylate (SALONPAS PAIN RELIEF PATCH EX) Apply 1 patch topically daily as needed (back pain).    . metoprolol succinate (TOPROL-XL) 25 MG 24 hr tablet TAKE 1 TABLET BY MOUTH EVERY DAY 90 tablet 2  . Revefenacin (YUPELRI) 175 MCG/3ML SOLN Inhale 1 puff into the lungs daily. 90 mL 5  .  spironolactone (ALDACTONE) 25 MG tablet TAKE 0.5 TABLETS (12.5 MG TOTAL) BY MOUTH AT BEDTIME. 45 tablet 0  . Tiotropium Bromide Monohydrate (SPIRIVA RESPIMAT) 1.25 MCG/ACT AERS Inhale 2 puffs into the lungs daily. 12 g 1  . triazolam (HALCION) 0.25 MG tablet Take 1 tablet (0.25 mg total) by mouth at bedtime as needed for sleep. 30 tablet 3  . TURMERIC PO Take 2 tablets by mouth daily.    Alveda Reasons 20 MG TABS tablet TAKE 1 TABLET (20 MG TOTAL) BY MOUTH DAILY WITH SUPPER. 90 tablet 1   No current facility-administered medications for this visit.    Pertinent items are noted in HPI.   Review of Systems:  Review of Systems  Constitutional: Positive for malaise/fatigue.  HENT: Negative.   Eyes: Negative.   Respiratory: Positive for shortness of breath. Negative for cough, hemoptysis, sputum production and wheezing.   Cardiovascular: Positive for palpitations, orthopnea and leg swelling. Negative for chest pain, claudication and PND.  Gastrointestinal: Negative.   Genitourinary: Negative.   Musculoskeletal: Positive for myalgias. Negative for back pain, falls, joint pain and neck pain.  Skin: Negative.   Neurological: Negative.   Endo/Heme/Allergies: Negative for environmental allergies and polydipsia. Bruises/bleeds easily.  Psychiatric/Behavioral: Negative.        Physical Exam: BP 112/65 (BP Location: Right Arm)   Pulse 65   Temp 97.7 F (36.5 C) (Temporal)   Resp 20   Ht 6\' 5"  (1.956 m)   Wt 297 lb (134.7 kg)   SpO2 93% Comment: RA  BMI 35.22  kg/m   General appearance: alert, cooperative and no distress Head: Normocephalic, without obvious abnormality, atraumatic Neck: no adenopathy, no carotid bruit, no JVD, supple, symmetrical, trachea midline and thyroid not enlarged, symmetric, no tenderness/mass/nodules Lymph nodes: Cervical, supraclavicular, and axillary nodes normal. Resp: clear to auscultation bilaterally Cardio: regular rate and rhythm and diastolic murmur: mid  diastolic 2/6, decrescendo at 2nd left intercostal space GI: soft, non-tender; bowel sounds normal; no masses,  no organomegaly Extremities: extremities normal, atraumatic, no cyanosis or edema and Homans sign is negative, no sign of DVT Neurologic: Grossly normal      Recent Radiology Findings:  CT ANGIO CHEST AORTA W/CM & OR WO/CM  Result Date: 09/14/2019 CLINICAL DATA:  Follow-up thoracic aortic aneurysm EXAM: CT ANGIOGRAPHY CHEST WITH CONTRAST TECHNIQUE: Multidetector CT imaging of the chest was performed using the standard protocol during bolus administration of intravenous contrast. Multiplanar CT image reconstructions and MIPs were obtained to evaluate the vascular anatomy. CONTRAST:  29mL ISOVUE-370 IOPAMIDOL (ISOVUE-370) INJECTION 76% COMPARISON:  Chest CTA 12/15/2018, included portion from coronary CTA 04/03/2019 FINDINGS: Cardiovascular: Fusiform aneurysmal dilatation of the proximal ascending aorta measuring 5.3 cm greatest dimension. This measured 5.2 cm on October 2020 exam, and 5 cm on July 2020 exam. Distal aspect of the ascending aorta measures 4.4 cm (series 10, image 82. Normal caliber of the transverse aorta. Slight increase enlargement of the distal arch and descending aorta, 4.3 cm, series 5, image 47, previously 4.1 cm. Proximal descending aorta measures 4.5 cm, series 12, image 146. Distal descending aorta at the level of the left atrium measures 3.8 cm. The aorta at the diaphragmatic hiatus measures 3.7 cm. No dissection or perivascular stranding. Moderate calcified and noncalcified atheromatous plaque. Conventional branching pattern from the aortic arch. Brachiocephalic artery is tortuous. Heart is normal in size. There are coronary artery calcifications. Minimal pericardial effusion measuring up to 9 mm in depth adjacent to the right ventricle. Mediastinum/Nodes: No enlarged mediastinal or hilar lymph nodes. Multiple small mediastinal lymph nodes are similar to prior and likely  reactive. No visualized thyroid nodule. No definite esophageal wall thickening. Lungs/Pleura: Minor linear/compressive atelectasis in the left lower lobe. Unchanged nonspecific ground-glass opacity in the superior most segment of the right lower lobe. Stable perifissural nodule in the left lower lobe series 7, image 70. Stable pleural-based 6 mm nodule in the right lower lobe series 7, image 85. No new pulmonary nodule. No confluent airspace disease. Trachea and mainstem bronchi are patent. No pleural fluid. No evidence of pulmonary edema. Upper Abdomen: No acute findings. Atherosclerosis of upper abdominal aorta. Musculoskeletal: There are no acute or suspicious osseous abnormalities. 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 Review of the MIP images confirms the above findings. IMPRESSION: 1. Fusiform ascending thoracic aortic aneurysm measures 5.3 cm greatest dimension, previously 5.2 cm on October 2020 exam. Slight increase enlargement of the distal arch and descending aorta, 4.3 cm, previously 4.1 cm. No dissection or acute aortic abnormality. Aortic aneurysm NOS (ICD10-I71.9) 2. Stable pulmonary nodules. 3. Coronary artery calcifications. Aortic Atherosclerosis (ICD10-I70.0). Electronically Signed   By: Keith Rake M.D.   On: 09/14/2019 12:31     ECHOCARDIOGRAM COMPLETE  Result Date: 07/29/2019    ECHOCARDIOGRAM REPORT   Patient Name:   Clinton Black Date of Exam: 07/28/2019 Medical Rec #:  [id number]       Height:       77.0 in Accession #:    [id number]      Weight:  296.0 lb Date of Birth:  11-Mar-1946      BSA:          2.64 m Patient Age:    89 years        BP:           134/78 mmHg Patient Gender: M               HR:           79 bpm. Exam Location:  [facility] Procedure: 2D Echo                                MODIFIED REPORT: This report was modified by Cherlynn Kaiser MD on 07/29/2019 due to corrections.  Indications:     Atrial  Fibrillation 427.31 / I48.91  History:         Patient has prior history of Echocardiogram examinations, most                  recent 12/28/2018. CHF, Arrythmias:Atrial Fibrillation; Risk                  Factors:Hypertension, Diabetes and Dyslipidemia. Thoracic                  aortic aneurysm.  Sonographer:     Clayton Lefort RDCS (AE) Referring Phys:  [id number] [name] [name] Diagnosing Phys: Cherlynn Kaiser MD  Sonographer Comments: Image acquisition challenging due to respiratory motion. IMPRESSIONS  1. Left ventricular ejection fraction, by estimation, is 60%. The left ventricle has normal function. The left ventricle has no regional wall motion abnormalities. The left ventricular internal cavity size was mildly dilated. There is mildly increased left ventricular hypertrophy. Left ventricular diastolic parameters are indeterminate.  2. Right ventricular systolic function is normal. The right ventricular size is normal. Tricuspid regurgitation signal is inadequate for assessing PA pressure.  3. The mitral valve is normal in structure and function. No evidence of mitral valve regurgitation. No evidence of mitral stenosis.  4. The aortic valve is grossly normal. Aortic valve regurgitation is moderate. No aortic stenosis is present.  5. Aortic dilatation noted. There is moderate to severe dilatation of the ascending aorta measuring 53 mm.  6. The inferior vena cava is normal in size with greater than 50% respiratory variability, suggesting right atrial pressure of 3 mmHg. Comparison(s): A prior study was performed on 12/28/2018. Prior images reviewed side by side. Side by side measurements performed. When measured with a similar technique, LV cavity measures 59-61 mm in diastole and 47 mm in systole. LV function is preserved. Ascending aorta measurement has not significantly changed when measured in a similar location, and in comparison to CT PV study 04/03/2019 may not have significantly changed. FINDINGS  Left Ventricle:  Left ventricular ejection fraction, by estimation, is 60%. The left ventricle has normal function. The left ventricle has no regional wall motion abnormalities. The left ventricular internal cavity size was mildly dilated. There is mildly increased left ventricular hypertrophy. Left ventricular diastolic parameters are indeterminate. Right Ventricle: The right ventricular size is normal. No increase in right ventricular wall thickness. Right ventricular systolic function is normal. Tricuspid regurgitation signal is inadequate for assessing PA pressure. Left Atrium: Left atrial size was normal in size. Right Atrium: Right atrial size was normal in size. Pericardium: There is no evidence of pericardial effusion. Presence of large anterior epicardial fat pad. Mitral Valve: The mitral valve is normal  in structure and function. Normal mobility of the mitral valve leaflets. No evidence of mitral valve regurgitation. No evidence of mitral valve stenosis. Tricuspid Valve: The tricuspid valve is normal in structure. Tricuspid valve regurgitation is trivial. No evidence of tricuspid stenosis. Aortic Valve: The aortic valve is grossly normal. Aortic valve regurgitation is moderate. Aortic regurgitation PHT measures 622 msec. No aortic stenosis is present. Aortic valve mean gradient measures 4.0 mmHg. Aortic valve peak gradient measures 6.7 mmHg. Aortic valve area, by VTI measures 2.80 cm. Pulmonic Valve: The pulmonic valve was normal in structure. Pulmonic valve regurgitation is trivial. No evidence of pulmonic stenosis. Aorta: Aortic dilatation noted. There is moderate to severe dilatation of the ascending aorta measuring 53 mm. Venous: The inferior vena cava is normal in size with greater than 50% respiratory variability, suggesting right atrial pressure of 3 mmHg. IAS/Shunts: No atrial level shunt detected by color flow Doppler.  LEFT VENTRICLE PLAX 2D LVIDd:         6.10 cm LVIDs:         4.70 cm LV PW:         1.34 cm LV  IVS:        1.25 cm LVOT diam:     2.10 cm LV SV:         90.40 ml LV SV Index:   30.87 LVOT Area:     3.46 cm  RIGHT VENTRICLE            IVC RV Basal diam:  2.76 cm    IVC diam: 1.74 cm RV S prime:     9.53 cm/s TAPSE (M-mode): 2.7 cm LEFT ATRIUM           Index       RIGHT ATRIUM           Index LA diam:      3.10 cm 1.17 cm/m  RA Area:     13.00 cm LA Vol (A2C): 68.0 ml 25.73 ml/m RA Volume:   27.20 ml  10.29 ml/m LA Vol (A4C): 63.6 ml 24.07 ml/m  AORTIC VALVE AV Area (Vmax):    3.01 cm AV Area (Vmean):   2.88 cm AV Area (VTI):     2.80 cm AV Vmax:           129.00 cm/s AV Vmean:          89.300 cm/s AV VTI:            0.323 m AV Peak Grad:      6.7 mmHg AV Mean Grad:      4.0 mmHg LVOT Vmax:         112.00 cm/s LVOT Vmean:        74.300 cm/s LVOT VTI:          0.261 m LVOT/AV VTI ratio: 0.81 AI PHT:            622 msec  AORTA Ao Root diam: 3.50 cm Ao Asc diam:  5.30 cm  SHUNTS Systemic VTI:  0.26 m Systemic Diam: 2.10 cm Cherlynn Kaiser MD Electronically signed by Cherlynn Kaiser MD Signature Date/Time: 07/29/2019/10:41:43 AM    Final (Updated)     Ct Angio Chest Aorta W &/or Wo Contrast  Result Date: 12/15/2018 CLINICAL DATA:  Follow-up TAA EXAM: CT ANGIOGRAPHY CHEST WITH CONTRAST TECHNIQUE: Multidetector CT imaging of the chest was performed using the standard protocol during bolus administration of intravenous contrast. Multiplanar CT image reconstructions and MIPs were obtained to evaluate the  vascular anatomy. CONTRAST:  21mL ISOVUE-370 IOPAMIDOL (ISOVUE-370) INJECTION 76% COMPARISON:  11/04/2017, 02/26/2017 FINDINGS: Cardiovascular: Preferential opacification of the thoracic aorta. Unchanged enlargement of the tubular thoracic aorta, measuring 5.0 x 4.8 cm. The aortic root and sinuses of Valsalva are normal in caliber. Unchanged enlargement of the distal arch and descending thoracic aorta measuring 4.1 x 4.1 cm in the midportion of the descending aorta. Moderate mixed calcific atherosclerosis.  Normal heart size. Left coronary artery calcifications. No pericardial effusion. Mediastinum/Nodes: No enlarged mediastinal, hilar, or axillary lymph nodes. Thyroid gland, trachea, and esophagus demonstrate no significant findings. Lungs/Pleura: Scattered nonspecific ground-glass opacity of the superior segment right lower lobe. Stable, small benign pulmonary nodules, for example a 3 mm fissural nodule of the superior segment left lower lobe (series 7, image 72). No pleural effusion or pneumothorax. Upper Abdomen: No acute abnormality. 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 No acute or significant osseous findings. Review of the MIP images confirms the above findings. IMPRESSION: 1. Unchanged enlargement of the tubular thoracic aorta, measuring 5.0 x 4.8 cm. The aortic root and sinuses of Valsalva are normal in caliber. Unchanged enlargement of the distal arch and descending thoracic aorta measuring 4.1 x 4.1 cm in the midportion of the descending aorta. These findings are not significantly changed when compared to examinations dating back to 02/26/2017 and measured similarly. Moderate mixed calcific atherosclerosis. 2.  Coronary artery disease. 3. Scattered nonspecific ground-glass opacity of the superior segment right lower lobe, likely infectious or inflammatory. 4. Stable, small benign pulmonary nodules, for example a 3 mm fissural nodule of the superior segment left lower lobe (series 7, image 72). Electronically Signed   By: Eddie Candle M.D.   On: 12/15/2018 11:04      Ct Angio Chest Aorta W/cm &/or Wo/cm  Result Date: 11/04/2017 CLINICAL DATA:  Thoracic aortic aneurysm EXAM: CT ANGIOGRAPHY CHEST WITH CONTRAST TECHNIQUE: Multidetector CT imaging of the chest was performed using the standard protocol during bolus administration of intravenous contrast. Multiplanar CT image reconstructions and MIPs were obtained to evaluate the vascular anatomy. CONTRAST:  29mL ISOVUE-370  IOPAMIDOL (ISOVUE-370) INJECTION 76% COMPARISON:  02/26/2017 FINDINGS: Cardiovascular: Maximal diameters of the ascending aorta at the sinus of Valsalva, sino-tubular junction, and ascending aorta are 3.9 cm, 3.8 cm, and 5.2 cm respectively. Based on my direct measurements on the prior study, this is not significantly changed. There is no evidence of aortic dissection. There is no obvious intramural hematoma. Maximal diameter of the proximal descending thoracic aorta is 4.3 cm. The aortic arch is nonaneurysmal. Atherosclerotic calcifications in the aortic arch. Minimal atherosclerotic calcifications in the descending thoracic aorta. Great vessels are patent. Right subclavian artery is ectatic with a maximal diameter of 2.0 cm. Mild LAD territory coronary artery calcifications. No obvious acute pulmonary thromboembolism. Mediastinum/Nodes: No abnormal adenopathy. No pericardial effusion. Visualized thyroid is atrophic. Mild diffuse wall thickening of the esophagus. Lungs/Pleura: No pneumothorax. No pleural effusion. Pleural base nodule in the lateral right lower lobe on image 92 of series 6 measures 7 mm. This is stable. Upper Abdomen: No acute abnormality. Musculoskeletal: No vertebral compression deformity. Review of the MIP images confirms the above findings. IMPRESSION: Aneurysmal dilatation of the ascending aorta is 5.2 cm. Based on my direct measurements of the prior study, this has not significantly changed. There is no evidence of dissection. Mild aneurysmal dilatation of the descending thoracic aorta at 4.3 cm is also not significantly changed. Ascending thoracic aortic aneurysm. Recommend semi-annual imaging followup by CTA or  MRA and referral to cardiothoracic surgery if not already obtained. This recommendation follows 2010 ACCF/AHA/AATS/ACR/ASA/SCA/SCAI/SIR/STS/SVM Guidelines for the Diagnosis and Management of Patients With Thoracic Aortic Disease. Circulation. 2010; 121: [id number] Aortic  Atherosclerosis (ICD10-I70.0). Electronically Signed   By: Marybelle Killings M.D.   On: 11/04/2017 09:05   CLINICAL DATA:  Aortic root dilatation.  EXAM: CT ANGIOGRAPHY CHEST WITH CONTRAST  TECHNIQUE: Multidetector CT imaging of the chest was performed using the standard protocol during bolus administration of intravenous contrast. Multiplanar CT image reconstructions and MIPs were obtained to evaluate the vascular anatomy.  CONTRAST:  100 mL of Isovue 370 intravenously.  COMPARISON:  None.  FINDINGS: Cardiovascular: Aortic root measures 3.9 cm in diameter. 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 Aneurysmal dilatation of descending thoracic aorta is noted with maximum measured diameter 4.1 cm. Aortic atherosclerosis is noted without dissection. Great vessels are widely patent without stenosis. No pericardial effusion is noted.  Mediastinum/Nodes: No enlarged mediastinal, hilar, or axillary lymph nodes. Thyroid gland, trachea, and esophagus demonstrate no significant findings.  Lungs/Pleura: Lungs are clear. No pleural effusion or pneumothorax.  Upper Abdomen: Probable fatty infiltration of the liver is noted.  Musculoskeletal: 8.6 x 5.3 cm fat containing lesion is seen within the right pectoralis muscle most consistent with intramuscular lipoma.  Review of the MIP images confirms the above findings.  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 Recommend semi-annual imaging followup by CTA or MRA and referral to cardiothoracic surgery if not already obtained. This recommendation follows 2010 ACCF/AHA/AATS/ACR/ASA/SCA/SCAI/SIR/STS/SVM Guidelines for the Diagnosis and Management of Patients With Thoracic Aortic Disease. Circulation. 2010; 121SP:1689793.  Probable fatty infiltration of the liver.  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  Aortic  Atherosclerosis (ICD10-I70.0).   Electronically Signed   By: Marijo Conception, M.D.   On: 02/26/2017 11:57  I have independently reviewed the above radiologic studies.  Recent Lab Findings: Lab Results  Component Value Date   WBC 8.3 09/06/2019   HGB 14.3 09/06/2019   HCT 43.4 09/06/2019   PLT 188.0 09/06/2019   GLUCOSE 106 (H) 09/06/2019   CHOL 148 05/29/2019   TRIG 255.0 (H) 05/29/2019   HDL 34.00 (L) 05/29/2019   LDLDIRECT 78.0 05/29/2019   LDLCALC 48 06/01/2018   ALT 21 06/01/2018   AST 15 06/01/2018   NA 140 09/06/2019   K 4.6 09/06/2019   CL 106 09/06/2019   CREATININE 1.05 09/06/2019   BUN 19 09/06/2019   CO2 27 09/06/2019   TSH 4.29 09/06/2019   HGBA1C 6.6 (H) 09/06/2019    Study Highlights    The left ventricular ejection fraction is moderately decreased (30-44%).  Nuclear stress EF: 39%.  There was no ST segment deviation noted during stress.  No T wave inversion was noted during stress.  This is an intermediate risk study due to reduced systolic function.  There is no ischemia.    Nuclear History and Indications   History and Indications Indication for Stress Test: Diagnosis of coronary disease History: Hx Acute Bronchitis; No prior cardiac history reported; No prior NUC MPI for comparison. Cardiac Risk Factors: Family History - CAD, History of Smoking, Hypertension, Lipids and Obesity  Symptoms: Dizziness, DOE, Fatigue and SOB  Stress Findings   ECG Baseline ECG exhibits normal sinus rhythm..  Stress Findings A pharmacological stress test was performed using IV Lexiscan 0.4mg  over 10 seconds performed without concurrent submaximal exercise.  The patient reported  shortness of breath during the stress test.   Test was stopped per protocol.  Response to Stress There was no ST segment deviation noted during stress.  No T wave inversion was noted during stress. Arrhythmias during stress: frequent PVCs.  Arrhythmias during recovery: none.   Arrhythmias were not significant.  ECG was interpretable and there was no significant change from baseline.  Stress Measurements   Baseline Vitals  Rest HR 63 bpm    Rest BP 141/67 mmHg    Peak Stress Vitals  Peak HR 74 bpm    Peak BP 136/69 mmHg       Nuclear Stress Measurements   LV sys vol 143 mL    TID 0.98     LV dias vol 234 mL    SSS 0     SRS 0     SDS 0          Nuclear Stress Findings   Isotope administration Rest isotope was administered with an IV injection of 31.3 mCi technetium tetrofosmin. Rest SPECT images were obtained approximately 45 minutes post tracer injection. Stress isotope was administered with an IV injection of 29.6 mCi technetium tetrofosmin 20 seconds post IV Lexiscan administration. Stress SPECT images were obtained approximately 60 minutes post tracer injection.  Nuclear Study Quality Overall image quality is good.  Nuclear Measurements Study was gated.  Rest Perfusion Rest perfusion normal.  Stress Perfusion Stress perfusion normal.  Overall Study Impression Myocardial perfusion is normal. This is an intermediate risk study. Overall left ventricular systolic function was abnormal. LV cavity size is moderately enlarged. Nuclear stress EF: 39%. The left ventricular ejection fraction is moderately decreased (30-44%). There is no prior study for comparison.  From: ACCF/SCAI/STS/AATS/AHA/ASNC/HFSA/SCCT 2012 Appropriate Use Criteria for Coronary Revascularization Focused Update  Wall Scoring   Score Index: 2.000 Percent Normal: 0.0%          The left ventricular wall motion is globally hypokinetic.          Signed   Electronically signed by Skeet Latch, MD on 02/16/17 at [facility]  Report approved and finalized on 02/16/2017 1918   Cardiac Cath:  LV end diastolic pressure is normal.  There is moderate (3+) aortic regurgitation.   1. Normal coronary anatomy. Left dominant circulation 2. Normal right heart pressures 3. Normal  cardiac output.  4. Moderate aortic insufficiency. No significant stenosis 5. Ascending thoracic aortic aneurysm.   Plan: will review with Dr Servando Snare. If Aortic surgery is not planned in the near future would recommend referral to EP for consideration of ablation of Afib.    Aortic Size Index=  5.3  /Body surface area is 2.71 meters squared. 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  We discussed the pros and cons of surgical intervention including the expected postoperative course time to recover .  As noted he does supply a significant amount of care to his fiance .  He returned today to discuss timing of surgery after he had discussed with his family support he and his fianc will need.  He is also now concerned about the need for prostate biopsy.  He will discuss with urology tomorrow about proceeding with biopsy in the near future prior to cardiac surgery.  We would then plan to proceed with correcting his aortic insufficiency and dilated ascending aorta, followed by any necessary treatment for prostate cancer if the biopsy confirms that diagnosis.  Prior to surgery the patient will need  to stop his Synjardy for at least 3 days prior to surgery both because of the Metformin and also the risk of euglycemic ketoacidosis in the perioperative.Marland Kitchen He will also need to be off Xarelto prior to surgery  The majority of the visit today was face-to-face discussion with the patient concerning the risks and options of surgical treatment of his aortic valve disease and descending aorta- 40 min  [name] MD      [address] [place] [address] Office [phone]   Beeper [phone]  09/25/2019 4:48 PM

## 2019-09-26 ENCOUNTER — Other Ambulatory Visit: Payer: Self-pay | Admitting: *Deleted

## 2019-09-26 ENCOUNTER — Encounter: Payer: Self-pay | Admitting: *Deleted

## 2019-09-26 DIAGNOSIS — I351 Nonrheumatic aortic (valve) insufficiency: Secondary | ICD-10-CM

## 2019-09-26 DIAGNOSIS — I712 Thoracic aortic aneurysm, without rupture, unspecified: Secondary | ICD-10-CM

## 2019-09-26 DIAGNOSIS — I4819 Other persistent atrial fibrillation: Secondary | ICD-10-CM

## 2019-10-09 ENCOUNTER — Other Ambulatory Visit: Payer: Self-pay | Admitting: Internal Medicine

## 2019-10-09 DIAGNOSIS — G4700 Insomnia, unspecified: Secondary | ICD-10-CM

## 2019-10-11 NOTE — Progress Notes (Signed)
[id number],  - [address] [place] [address] Phone: [phone] Fax: [phone]  [place], [place] - [address] [place] [address] Phone: [phone] Fax: [phone]  [place], [place] [place] [place] [address] Phone: [phone] Fax: [facility] [place], [place] [address] Phone: [phone] Fax: [phone]      Your procedure is scheduled on Monday May 3  Report to [facility] Main Entrance "A" at 0530 A.M., and check in at the Admitting office.  Call this number if you have problems the morning of surgery:  [phone]  Call [phone] if you have any questions prior to your surgery date Monday-Friday 8am-4pm    Remember:  Do not eat or drink after midnight the night before your surgery     Take these medicines the morning of surgery with A SIP OF WATER  alfuzosin (UROXATRAL) amiodarone (PACERONE) metoprolol succinate (TOPROL-XL)  Revefenacin (YUPELRI) Please bring all inhalers with you the day of surgery.  Tiotropium Bromide Monohydrate (SPIRIVA RESPIMAT)   Follow your surgeon's instructions on when to stop XARELTO.  If no instructions were given by your surgeon then you will need to call the office to get those instructions.    Per Dr. Servando Snare please stop  Sanjardy 3 days prior to surgery  As of today, STOP taking any Aspirin (unless otherwise instructed by your surgeon) and Aspirin containing products, Aleve, Naproxen, Ibuprofen, Motrin, Advil, Goody's, BC's, all herbal medications, fish oil, and all vitamins.   WHAT DO I DO ABOUT MY DIABETES MEDICATION?   Marland Kitchen Do not take oral diabetes medicines (pills) the morning of surgery.  HOW TO MANAGE YOUR DIABETES BEFORE AND AFTER SURGERY  Why  is it important to control my blood sugar before and after surgery? . Improving blood sugar levels before and after surgery helps healing and can limit problems. . A way of improving blood sugar control is eating a healthy diet by: o  Eating less sugar and carbohydrates o  Increasing activity/exercise o  Talking with your doctor about reaching your blood sugar goals . High blood sugars (greater than 180 mg/dL) can raise your risk of infections and slow your recovery, so you will need to focus on controlling your diabetes during the weeks before surgery. . Make sure that the doctor who takes care of your diabetes knows about your planned surgery including the date and location.  How do I manage my blood sugar before surgery? . Check your blood sugar at least 4 times a day, starting 2 days before surgery, to make sure that the level is not too high or low. . Check your blood sugar the morning of your surgery when you wake up and every 2 hours until you get to the Short Stay unit. o If your blood sugar is less than 70 mg/dL, you will need to treat for low blood sugar: - Do not take insulin. - Treat a low blood sugar (less than 70 mg/dL) with  cup of clear juice (cranberry or apple), 4 glucose tablets, OR glucose gel. - Recheck blood sugar in 15 minutes after treatment (to make sure it is greater than 70 mg/dL). If your blood sugar is not greater than 70 mg/dL on recheck, call [phone] for further instructions. . Report your  blood sugar to the short stay nurse when you get to Short Stay.  . If you are admitted to the hospital after surgery: o Your blood sugar will be checked by the staff and you will probably be given insulin after surgery (instead of oral diabetes medicines) to make sure you have good blood sugar levels. o The goal for blood sugar control after surgery is 80-180 mg/dL.                      Do not wear jewelry, make up, or nail polish            Do not wear lotions, powders,  perfumes/colognes, or deodorant.            Do not shave 48 hours prior to surgery.  Men may shave face and neck.            Do not bring valuables to the hospital.            [facility] is not responsible for any belongings or valuables.  Do NOT Smoke (Tobacco/Vapping) or drink Alcohol 24 hours prior to your procedure If you use a CPAP at night, you may bring all equipment for your overnight stay.   Contacts, glasses, dentures or bridgework may not be worn into surgery.      For patients admitted to the hospital, discharge time will be determined by your treatment team.   Patients discharged the day of surgery will not be allowed to drive home, and someone needs to stay with them for 24 hours.    Special instructions:   [place]- Preparing For Surgery  Before surgery, you can play an important role. Because skin is not sterile, your skin needs to be as free of germs as possible. You can reduce the number of germs on your skin by washing with CHG (chlorahexidine gluconate) Soap before surgery.  CHG is an antiseptic cleaner which kills germs and bonds with the skin to continue killing germs even after washing.    Oral Hygiene is also important to reduce your risk of infection.  Remember - BRUSH YOUR TEETH THE MORNING OF SURGERY WITH YOUR REGULAR TOOTHPASTE  Please do not use if you have an allergy to CHG or antibacterial soaps. If your skin becomes reddened/irritated stop using the CHG.  Do not shave (including legs and underarms) for at least 48 hours prior to first CHG shower. It is OK to shave your face.  Please follow these instructions carefully.   1. Shower the NIGHT BEFORE SURGERY and the MORNING OF SURGERY with CHG Soap.   2. If you chose to wash your hair, wash your hair first as usual with your normal shampoo.  3. After you shampoo, rinse your hair and body thoroughly to remove the shampoo.  4. Use CHG as you would any other liquid soap. You can apply CHG directly to  the skin and wash gently with a scrungie or a clean washcloth.   5. Apply the CHG Soap to your body ONLY FROM THE NECK DOWN.  Do not use on open wounds or open sores. Avoid contact with your eyes, ears, mouth and genitals (private parts). Wash Face and genitals (private parts)  with your normal soap.   6. Wash thoroughly, paying special attention to the area where your surgery will be performed.  7. Thoroughly rinse your body with warm water from the neck down.  8. DO NOT shower/wash with your normal soap after  using and rinsing off the CHG Soap.  9. Pat yourself dry with a CLEAN TOWEL.  10. Wear CLEAN PAJAMAS to bed the night before surgery, wear comfortable clothes the morning of surgery  11. Place CLEAN SHEETS on your bed the night of your first shower and DO NOT SLEEP WITH PETS.   Day of Surgery:   Do not apply any deodorants/lotions.  Please wear clean clothes to the hospital/surgery center.   Remember to brush your teeth WITH YOUR REGULAR TOOTHPASTE.   Please read over the following fact sheets that you were given.

## 2019-10-12 ENCOUNTER — Ambulatory Visit (HOSPITAL_COMMUNITY)
Admission: RE | Admit: 2019-10-12 | Discharge: 2019-10-12 | Disposition: A | Discharge status: Home / Self Care | Payer: Medicare HMO | Source: Ambulatory Visit | Attending: Cardiothoracic Surgery | Admitting: Cardiothoracic Surgery

## 2019-10-12 ENCOUNTER — Other Ambulatory Visit (HOSPITAL_COMMUNITY)
Admission: RE | Admit: 2019-10-12 | Discharge: 2019-10-12 | Disposition: A | Discharge status: Home / Self Care | Payer: Medicare HMO | Source: Ambulatory Visit | Attending: Cardiothoracic Surgery | Admitting: Cardiothoracic Surgery

## 2019-10-12 ENCOUNTER — Encounter (HOSPITAL_COMMUNITY): Payer: Self-pay

## 2019-10-12 ENCOUNTER — Encounter (HOSPITAL_COMMUNITY)
Admission: RE | Admit: 2019-10-12 | Discharge: 2019-10-12 | Disposition: A | Discharge status: Home / Self Care | Payer: Medicare HMO | Source: Ambulatory Visit | Attending: Cardiothoracic Surgery | Admitting: Cardiothoracic Surgery

## 2019-10-12 ENCOUNTER — Other Ambulatory Visit: Payer: Self-pay

## 2019-10-12 DIAGNOSIS — I351 Nonrheumatic aortic (valve) insufficiency: Secondary | ICD-10-CM | POA: Diagnosis not present

## 2019-10-12 DIAGNOSIS — I712 Thoracic aortic aneurysm, without rupture, unspecified: Secondary | ICD-10-CM

## 2019-10-12 DIAGNOSIS — Z20822 Contact with and (suspected) exposure to covid-19: Secondary | ICD-10-CM | POA: Diagnosis not present

## 2019-10-12 DIAGNOSIS — Z01818 Encounter for other preprocedural examination: Secondary | ICD-10-CM | POA: Diagnosis not present

## 2019-10-12 DIAGNOSIS — E119 Type 2 diabetes mellitus without complications: Secondary | ICD-10-CM | POA: Diagnosis not present

## 2019-10-12 DIAGNOSIS — I4819 Other persistent atrial fibrillation: Secondary | ICD-10-CM | POA: Insufficient documentation

## 2019-10-12 DIAGNOSIS — Z951 Presence of aortocoronary bypass graft: Secondary | ICD-10-CM | POA: Insufficient documentation

## 2019-10-12 DIAGNOSIS — Z01810 Encounter for preprocedural cardiovascular examination: Secondary | ICD-10-CM | POA: Diagnosis not present

## 2019-10-12 HISTORY — DX: Unspecified atrial fibrillation: I48.91

## 2019-10-12 LAB — HEMOGLOBIN A1C
Hgb A1c MFr Bld: 6.3 % — ABNORMAL HIGH (ref 4.8–5.6)
Mean Plasma Glucose: 134.11 mg/dL

## 2019-10-12 LAB — CBC
HCT: 44.3 % (ref 39.0–52.0)
Hemoglobin: 14.4 g/dL (ref 13.0–17.0)
MCH: 29.6 pg (ref 26.0–34.0)
MCHC: 32.5 g/dL (ref 30.0–36.0)
MCV: 91 fL (ref 80.0–100.0)
Platelets: 196 10*3/uL (ref 150–400)
RBC: 4.87 MIL/uL (ref 4.22–5.81)
RDW: 13.3 % (ref 11.5–15.5)
WBC: 8.4 10*3/uL (ref 4.0–10.5)
nRBC: 0 % (ref 0.0–0.2)

## 2019-10-12 LAB — ABO/RH: ABO/RH(D): O POS

## 2019-10-12 LAB — URINALYSIS, ROUTINE W REFLEX MICROSCOPIC
Bacteria, UA: NONE SEEN
Bilirubin Urine: NEGATIVE
Glucose, UA: >=500 mg/dL — AB
Hgb urine dipstick: NEGATIVE
Ketones, ur: NEGATIVE mg/dL
Leukocytes,Ua: NEGATIVE
Nitrite: NEGATIVE
Protein, ur: NEGATIVE mg/dL
Specific Gravity, Urine: 1.033 — ABNORMAL HIGH (ref 1.005–1.030)
pH: 5 (ref 5.0–8.0)

## 2019-10-12 LAB — COMPREHENSIVE METABOLIC PANEL
ALT: 26 U/L (ref 0–44)
AST: 20 U/L (ref 15–41)
Albumin: 4.3 g/dL (ref 3.5–5.0)
Alkaline Phosphatase: 88 U/L (ref 38–126)
Anion gap: 11 (ref 5–15)
BUN: 23 mg/dL (ref 8–23)
CO2: 21 mmol/L — ABNORMAL LOW (ref 22–32)
Calcium: 9.4 mg/dL (ref 8.9–10.3)
Chloride: 106 mmol/L (ref 98–111)
Creatinine, Ser: 1.19 mg/dL (ref 0.61–1.24)
GFR calc Af Amer: >60 mL/min (ref >60)
GFR calc non Af Amer: >60 mL/min (ref >60)
Glucose, Bld: 139 mg/dL — ABNORMAL HIGH (ref 70–99)
Potassium: 4.6 mmol/L (ref 3.5–5.1)
Sodium: 138 mmol/L (ref 135–145)
Total Bilirubin: 0.9 mg/dL (ref 0.3–1.2)
Total Protein: 7 g/dL (ref 6.5–8.1)

## 2019-10-12 LAB — SURGICAL PCR SCREEN
MRSA, PCR: NEGATIVE
Staphylococcus aureus: POSITIVE — AB

## 2019-10-12 LAB — BLOOD GAS, ARTERIAL
Acid-base deficit: 1.5 mmol/L (ref 0.0–2.0)
Bicarbonate: 22.4 mmol/L (ref 20.0–28.0)
Drawn by: 421801
FIO2: 21
O2 Saturation: 96.5 %
Patient temperature: 37
pCO2 arterial: 36.3 mmHg (ref 32.0–48.0)
pH, Arterial: 7.408 (ref 7.350–7.450)
pO2, Arterial: 84.1 mmHg (ref 83.0–108.0)

## 2019-10-12 LAB — PROTIME-INR
INR: 1 (ref 0.8–1.2)
Prothrombin Time: 12.7 seconds (ref 11.4–15.2)

## 2019-10-12 LAB — SARS CORONAVIRUS 2 (TAT 6-24 HRS): SARS Coronavirus 2: NEGATIVE

## 2019-10-12 LAB — APTT: aPTT: 29 seconds (ref 24–36)

## 2019-10-12 LAB — GLUCOSE, CAPILLARY: Glucose-Capillary: 141 mg/dL — ABNORMAL HIGH (ref 70–99)

## 2019-10-12 IMAGING — CR DG CHEST 2V
2 series · 2 of 2 positions shown · non-contrast
Comparison: [DATE]

CLINICAL DATA: Pre-admission for aortic valve insufficiency,
thoracic aortic aneurysm and atrial fibrillation.

EXAM:
CHEST - 2 VIEW

[w chest pa]
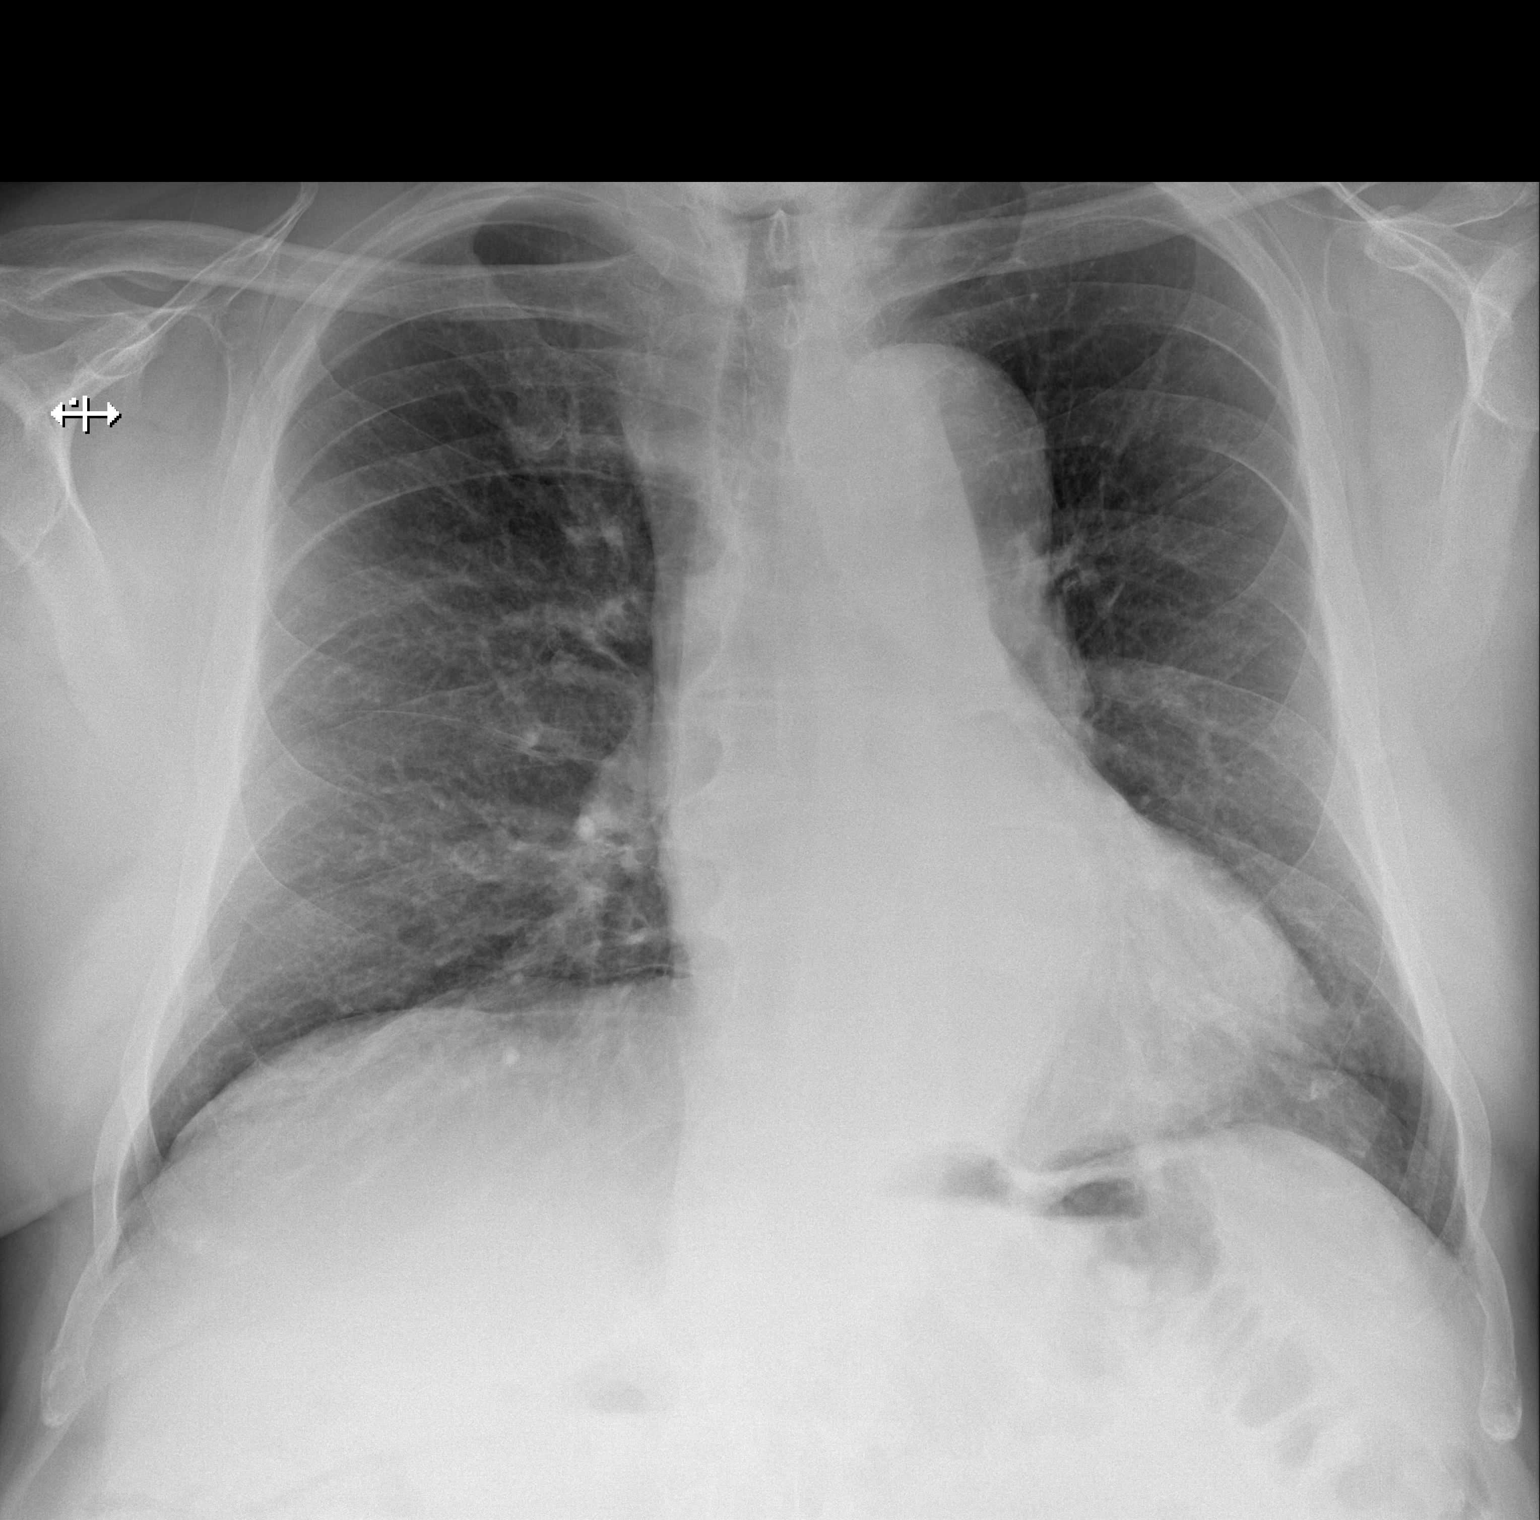

[w chest lat]
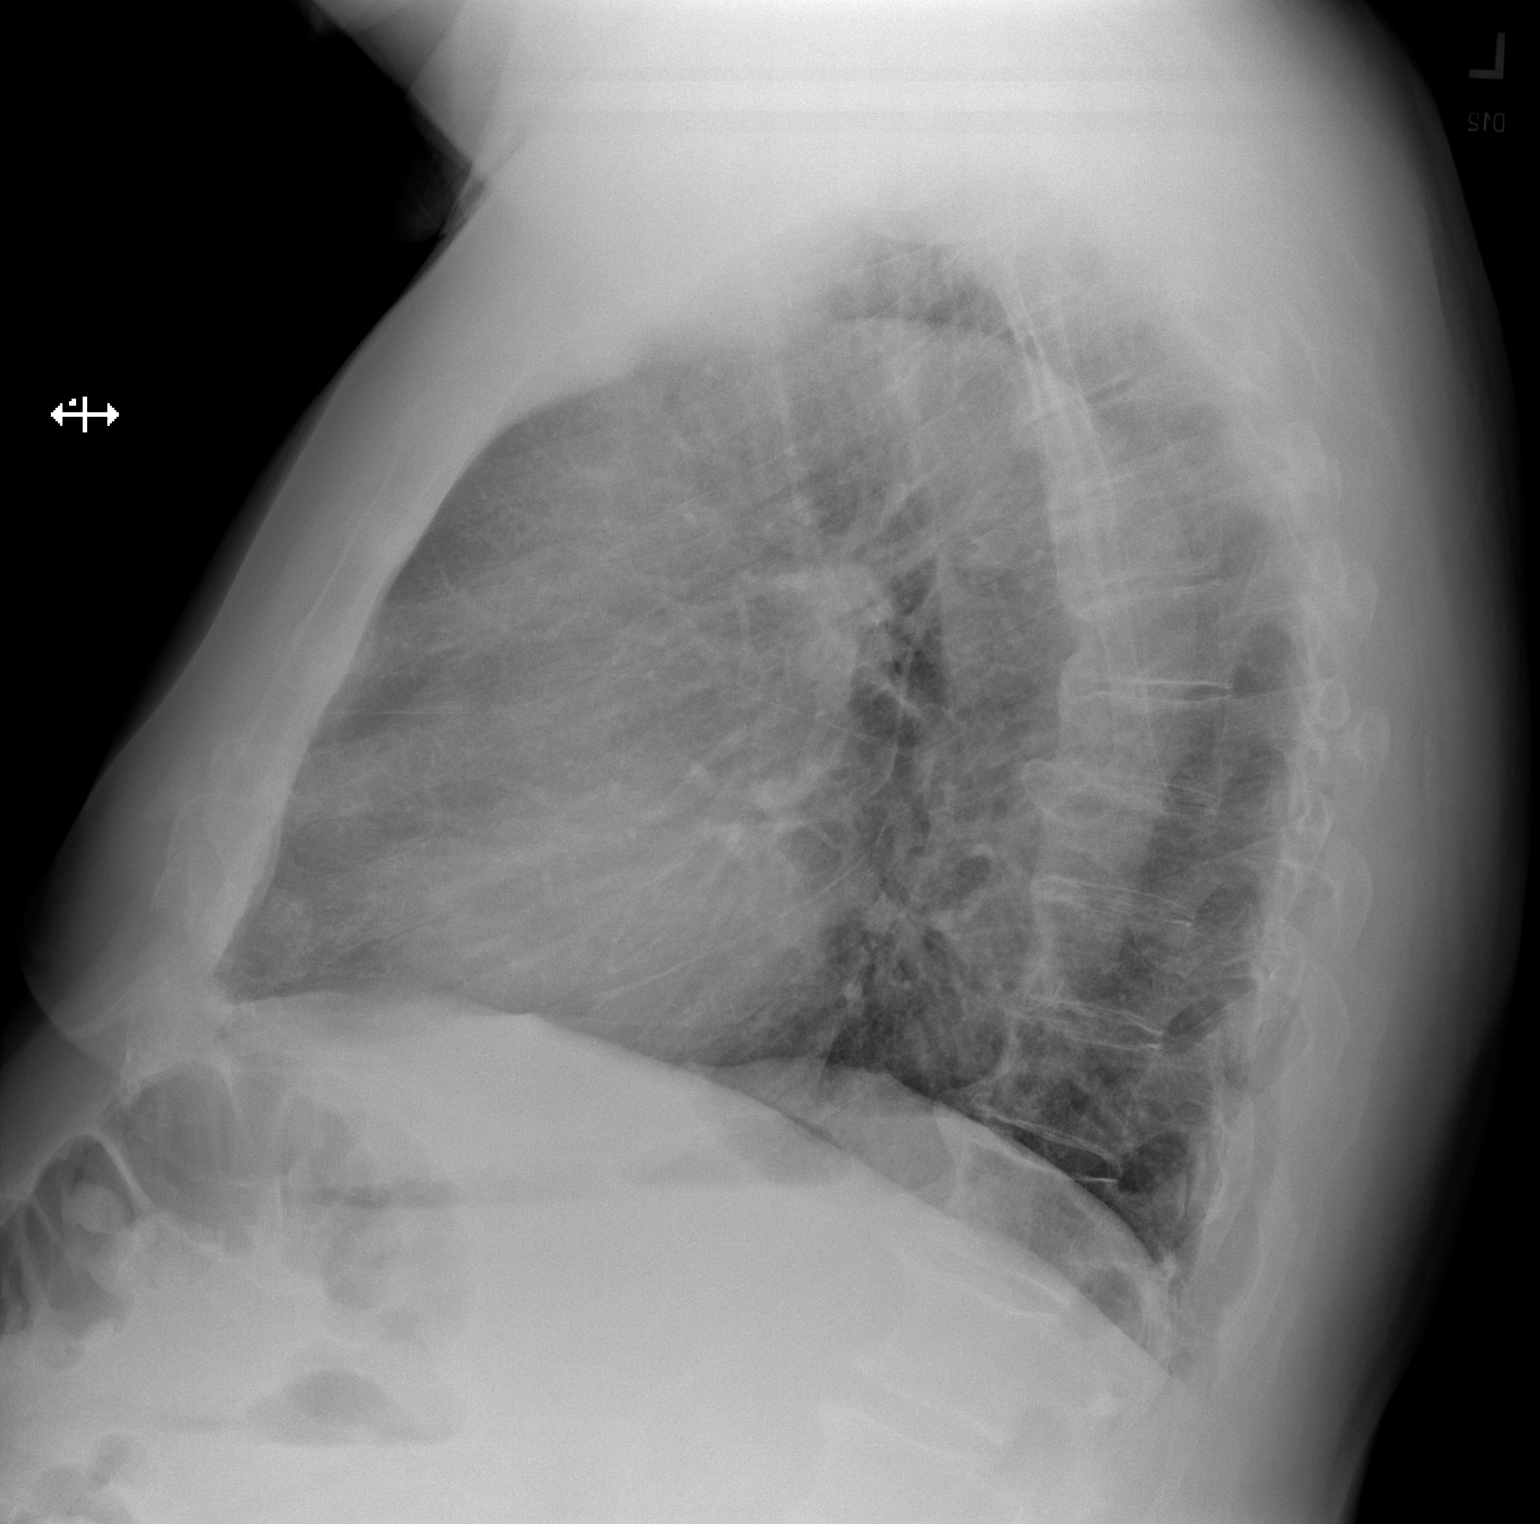

[2 of 2 positions shown; findings below may reference images not displayed]

FINDINGS: Lungs are adequately inflated without consolidation or effusion.
Cardiomediastinal silhouette is normal. There are degenerative
changes of the spine. Stable mild anterior wedging of a midthoracic
vertebral body.
IMPRESSION: No active cardiopulmonary disease.

## 2019-10-12 NOTE — Progress Notes (Signed)
PCP - Scarlette Calico Cardiologist - [name]  Chest x-ray - 10/12/19 EKG - 10/12/19 Stress Test - 02/16/17 ECHO - 07/28/19 Cardiac Cath - 01/30/19  Patient doesn't check blood sugars at home  Blood Thinner Instructions: LD on 10/07/19 Xarelto Last Dose of Sanardy 10/12/19 Aspirin Instructions: n/a  COVID TEST- in process 10/12/19   Anesthesia review: yes, Cardiac HX  Patient denies shortness of breath, fever, cough and chest pain at PAT appointment   All instructions explained to the patient, with a verbal understanding of the material. Patient agrees to go over the instructions while at home for a better understanding. Patient also instructed to self quarantine after being tested for COVID-19. The opportunity to ask questions was provided.

## 2019-10-12 NOTE — Progress Notes (Signed)
   10/12/19 1105  OBSTRUCTIVE SLEEP APNEA  Have you ever been diagnosed with sleep apnea through a sleep study? No  Do you snore loudly (loud enough to be heard through closed doors)?  0  Do you often feel tired, fatigued, or sleepy during the daytime (such as falling asleep during driving or talking to someone)? 0  Has anyone observed you stop breathing during your sleep? 0  Do you have, or are you being treated for high blood pressure? 1  BMI more than 35 kg/m2? 1  Age > 50 (1-yes) 1  Neck circumference greater than:Male 16 inches or larger, Male 17inches or larger? 1  Male Gender (Yes=1) 1  Obstructive Sleep Apnea Score 5  Score 5 or greater  Results sent to PCP

## 2019-10-12 NOTE — Progress Notes (Signed)
Pre op testing       has been completed. Preliminary results can be found under CV proc through chart review.  , BS, RDMS, RVT   

## 2019-10-12 NOTE — Progress Notes (Signed)
   10/12/19 1525  Complete When Order for Surgery Written:  Pre-op Surgical PCR Testing (All Areas )  Prescription to Pharmacy and Patient Notified.. Yes (Prescription called In at [place] [facility])  Swab Result Positive? Staph Aureus  Patient Notified? Yes  Date Notified 10/12/19

## 2019-10-13 ENCOUNTER — Ambulatory Visit (HOSPITAL_COMMUNITY)
Admission: RE | Admit: 2019-10-13 | Discharge: 2019-10-13 | Disposition: A | Discharge status: Home / Self Care | Payer: Medicare HMO | Source: Ambulatory Visit | Attending: Cardiothoracic Surgery | Admitting: Cardiothoracic Surgery

## 2019-10-13 DIAGNOSIS — I5032 Chronic diastolic (congestive) heart failure: Secondary | ICD-10-CM | POA: Diagnosis present

## 2019-10-13 DIAGNOSIS — I472 Ventricular tachycardia: Secondary | ICD-10-CM | POA: Diagnosis not present

## 2019-10-13 DIAGNOSIS — I712 Thoracic aortic aneurysm, without rupture, unspecified: Secondary | ICD-10-CM

## 2019-10-13 DIAGNOSIS — I351 Nonrheumatic aortic (valve) insufficiency: Secondary | ICD-10-CM

## 2019-10-13 DIAGNOSIS — J90 Pleural effusion, not elsewhere classified: Secondary | ICD-10-CM | POA: Diagnosis not present

## 2019-10-13 DIAGNOSIS — I776 Arteritis, unspecified: Secondary | ICD-10-CM | POA: Diagnosis not present

## 2019-10-13 DIAGNOSIS — Z87891 Personal history of nicotine dependence: Secondary | ICD-10-CM | POA: Diagnosis not present

## 2019-10-13 DIAGNOSIS — I110 Hypertensive heart disease with heart failure: Secondary | ICD-10-CM | POA: Diagnosis present

## 2019-10-13 DIAGNOSIS — Z952 Presence of prosthetic heart valve: Secondary | ICD-10-CM | POA: Diagnosis not present

## 2019-10-13 DIAGNOSIS — I350 Nonrheumatic aortic (valve) stenosis: Secondary | ICD-10-CM | POA: Diagnosis not present

## 2019-10-13 DIAGNOSIS — D62 Acute posthemorrhagic anemia: Secondary | ICD-10-CM | POA: Diagnosis not present

## 2019-10-13 DIAGNOSIS — Z8249 Family history of ischemic heart disease and other diseases of the circulatory system: Secondary | ICD-10-CM | POA: Diagnosis not present

## 2019-10-13 DIAGNOSIS — I480 Paroxysmal atrial fibrillation: Secondary | ICD-10-CM | POA: Diagnosis not present

## 2019-10-13 DIAGNOSIS — I4819 Other persistent atrial fibrillation: Secondary | ICD-10-CM | POA: Diagnosis present

## 2019-10-13 DIAGNOSIS — I459 Conduction disorder, unspecified: Secondary | ICD-10-CM | POA: Diagnosis not present

## 2019-10-13 DIAGNOSIS — Z6835 Body mass index (BMI) 35.0-35.9, adult: Secondary | ICD-10-CM | POA: Diagnosis not present

## 2019-10-13 DIAGNOSIS — I083 Combined rheumatic disorders of mitral, aortic and tricuspid valves: Secondary | ICD-10-CM | POA: Diagnosis not present

## 2019-10-13 DIAGNOSIS — J9811 Atelectasis: Secondary | ICD-10-CM | POA: Diagnosis not present

## 2019-10-13 DIAGNOSIS — K5900 Constipation, unspecified: Secondary | ICD-10-CM | POA: Diagnosis not present

## 2019-10-13 DIAGNOSIS — E1165 Type 2 diabetes mellitus with hyperglycemia: Secondary | ICD-10-CM | POA: Diagnosis present

## 2019-10-13 DIAGNOSIS — Z823 Family history of stroke: Secondary | ICD-10-CM | POA: Diagnosis not present

## 2019-10-13 DIAGNOSIS — E782 Mixed hyperlipidemia: Secondary | ICD-10-CM | POA: Diagnosis present

## 2019-10-13 DIAGNOSIS — I4891 Unspecified atrial fibrillation: Secondary | ICD-10-CM | POA: Diagnosis not present

## 2019-10-13 DIAGNOSIS — I77810 Thoracic aortic ectasia: Secondary | ICD-10-CM | POA: Diagnosis not present

## 2019-10-13 DIAGNOSIS — Z803 Family history of malignant neoplasm of breast: Secondary | ICD-10-CM | POA: Diagnosis not present

## 2019-10-13 DIAGNOSIS — R918 Other nonspecific abnormal finding of lung field: Secondary | ICD-10-CM | POA: Diagnosis not present

## 2019-10-13 LAB — PULMONARY FUNCTION TEST
DL/VA % pred: 100 %
DL/VA: 3.92 ml/min/mmHg/L
DLCO cor % pred: 79 %
DLCO cor: 24.94 ml/min/mmHg
DLCO unc % pred: 78 %
DLCO unc: 24.8 ml/min/mmHg
FEF 25-75 Post: 3.53 L/sec
FEF 25-75 Pre: 2.27 L/sec
FEF2575-%Change-Post: 55 %
FEF2575-%Pred-Post: 115 %
FEF2575-%Pred-Pre: 74 %
FEV1-%Change-Post: 11 %
FEV1-%Pred-Post: 81 %
FEV1-%Pred-Pre: 72 %
FEV1-Post: 3.35 L
FEV1-Pre: 2.99 L
FEV1FVC-%Change-Post: 3 %
FEV1FVC-%Pred-Pre: 101 %
FEV6-%Change-Post: 9 %
FEV6-%Pred-Post: 82 %
FEV6-%Pred-Pre: 75 %
FEV6-Post: 4.36 L
FEV6-Pre: 4 L
FEV6FVC-%Change-Post: 0 %
FEV6FVC-%Pred-Post: 104 %
FEV6FVC-%Pred-Pre: 103 %
FVC-%Change-Post: 8 %
FVC-%Pred-Post: 78 %
FVC-%Pred-Pre: 72 %
FVC-Post: 4.4 L
FVC-Pre: 4.05 L
Post FEV1/FVC ratio: 76 %
Post FEV6/FVC ratio: 99 %
Pre FEV1/FVC ratio: 74 %
Pre FEV6/FVC Ratio: 99 %
RV % pred: 82 %
RV: 2.37 L
TLC % pred: 77 %
TLC: 6.56 L

## 2019-10-13 MED ORDER — NOREPINEPHRINE 4 MG/250ML-% IV SOLN
0.0000 ug/min | INTRAVENOUS | Status: AC
  Administered 2019-10-16: 12 ug/min via INTRAVENOUS
  Filled 2019-10-13: qty 250

## 2019-10-13 MED ORDER — POTASSIUM CHLORIDE 2 MEQ/ML IV SOLN
80.0000 meq | INTRAVENOUS | Status: DC
  Filled 2019-10-13: qty 40

## 2019-10-13 MED ORDER — TRANEXAMIC ACID (OHS) PUMP PRIME SOLUTION
2.0000 mg/kg | INTRAVENOUS | Status: DC
  Filled 2019-10-13: qty 2.69

## 2019-10-13 MED ORDER — MILRINONE LACTATE IN DEXTROSE 20-5 MG/100ML-% IV SOLN
0.3000 ug/kg/min | INTRAVENOUS | Status: AC
  Administered 2019-10-16: .3 ug/kg/min via INTRAVENOUS
  Filled 2019-10-13: qty 100

## 2019-10-13 MED ORDER — TRANEXAMIC ACID (OHS) BOLUS VIA INFUSION
15.0000 mg/kg | INTRAVENOUS | Status: AC
  Administered 2019-10-16: 2020.5 mg via INTRAVENOUS
  Filled 2019-10-13: qty 2021

## 2019-10-13 MED ORDER — INSULIN REGULAR(HUMAN) IN NACL 100-0.9 UT/100ML-% IV SOLN
INTRAVENOUS | Status: AC
  Administered 2019-10-16: 3 [IU]/h via INTRAVENOUS
  Filled 2019-10-13: qty 100

## 2019-10-13 MED ORDER — SODIUM CHLORIDE 0.9 % IV SOLN
1.5000 g | INTRAVENOUS | Status: AC
  Administered 2019-10-16: 1.5 g via INTRAVENOUS
  Filled 2019-10-13: qty 1.5

## 2019-10-13 MED ORDER — PLASMA-LYTE 148 IV SOLN
INTRAVENOUS | Status: DC
  Filled 2019-10-13: qty 2.5

## 2019-10-13 MED ORDER — TRANEXAMIC ACID 1000 MG/10ML IV SOLN
1.5000 mg/kg/h | INTRAVENOUS | Status: AC
  Administered 2019-10-16: 1.5 mg/kg/h via INTRAVENOUS
  Filled 2019-10-13: qty 25

## 2019-10-13 MED ORDER — PHENYLEPHRINE HCL-NACL 20-0.9 MG/250ML-% IV SOLN
30.0000 ug/min | INTRAVENOUS | Status: AC
  Administered 2019-10-16: 25 ug/min via INTRAVENOUS
  Filled 2019-10-13: qty 250

## 2019-10-13 MED ORDER — SODIUM CHLORIDE 0.9 % IV SOLN
INTRAVENOUS | Status: DC
  Filled 2019-10-13: qty 30

## 2019-10-13 MED ORDER — DEXMEDETOMIDINE HCL IN NACL 400 MCG/100ML IV SOLN
0.1000 ug/kg/h | INTRAVENOUS | Status: AC
  Administered 2019-10-16: .3 ug/kg/h via INTRAVENOUS
  Filled 2019-10-13: qty 100

## 2019-10-13 MED ORDER — MAGNESIUM SULFATE 50 % IJ SOLN
40.0000 meq | INTRAMUSCULAR | Status: DC
  Filled 2019-10-13: qty 9.85

## 2019-10-13 MED ORDER — SODIUM CHLORIDE 0.9 % IV SOLN
750.0000 mg | INTRAVENOUS | Status: AC
  Administered 2019-10-16: 750 mg via INTRAVENOUS
  Filled 2019-10-13: qty 750

## 2019-10-13 MED ORDER — EPINEPHRINE HCL 5 MG/250ML IV SOLN IN NS
0.0000 ug/min | INTRAVENOUS | Status: DC
  Filled 2019-10-13: qty 250

## 2019-10-13 MED ORDER — NITROGLYCERIN IN D5W 200-5 MCG/ML-% IV SOLN
2.0000 ug/min | INTRAVENOUS | Status: DC
  Filled 2019-10-13: qty 250

## 2019-10-13 MED ORDER — VANCOMYCIN HCL 1500 MG/300ML IV SOLN
1500.0000 mg | INTRAVENOUS | Status: AC
  Administered 2019-10-16: 1500 mg via INTRAVENOUS
  Filled 2019-10-13: qty 300

## 2019-10-13 MED ORDER — ALBUTEROL SULFATE (2.5 MG/3ML) 0.083% IN NEBU
2.5000 mg | INHALATION_SOLUTION | Freq: Once | RESPIRATORY_TRACT | Status: AC
  Administered 2019-10-13: 2.5 mg via RESPIRATORY_TRACT

## 2019-10-16 ENCOUNTER — Inpatient Hospital Stay (HOSPITAL_COMMUNITY): Payer: Medicare HMO

## 2019-10-16 ENCOUNTER — Inpatient Hospital Stay (HOSPITAL_COMMUNITY): Payer: Medicare HMO | Admitting: Physician Assistant

## 2019-10-16 ENCOUNTER — Inpatient Hospital Stay (HOSPITAL_COMMUNITY)
Admission: RE | Admit: 2019-10-16 | Discharge: 2019-10-23 | DRG: 220 | Disposition: A | Discharge status: Home / Self Care | Payer: Medicare HMO | Attending: Cardiothoracic Surgery | Admitting: Cardiothoracic Surgery

## 2019-10-16 ENCOUNTER — Other Ambulatory Visit: Payer: Self-pay

## 2019-10-16 ENCOUNTER — Encounter (HOSPITAL_COMMUNITY)
Admission: RE | Disposition: A | Discharge status: Home / Self Care | Payer: Self-pay | Source: Ambulatory Visit | Attending: Cardiothoracic Surgery

## 2019-10-16 ENCOUNTER — Encounter (HOSPITAL_COMMUNITY): Payer: Self-pay | Admitting: Cardiothoracic Surgery

## 2019-10-16 ENCOUNTER — Ambulatory Visit: Payer: Medicare HMO | Admitting: Cardiology

## 2019-10-16 ENCOUNTER — Inpatient Hospital Stay (HOSPITAL_COMMUNITY): Payer: Medicare HMO | Admitting: Certified Registered"

## 2019-10-16 DIAGNOSIS — Z6835 Body mass index (BMI) 35.0-35.9, adult: Secondary | ICD-10-CM

## 2019-10-16 DIAGNOSIS — Z8249 Family history of ischemic heart disease and other diseases of the circulatory system: Secondary | ICD-10-CM | POA: Diagnosis not present

## 2019-10-16 DIAGNOSIS — Z823 Family history of stroke: Secondary | ICD-10-CM | POA: Diagnosis not present

## 2019-10-16 DIAGNOSIS — Z803 Family history of malignant neoplasm of breast: Secondary | ICD-10-CM | POA: Diagnosis not present

## 2019-10-16 DIAGNOSIS — I4819 Other persistent atrial fibrillation: Secondary | ICD-10-CM | POA: Diagnosis present

## 2019-10-16 DIAGNOSIS — I351 Nonrheumatic aortic (valve) insufficiency: Secondary | ICD-10-CM | POA: Diagnosis present

## 2019-10-16 DIAGNOSIS — E782 Mixed hyperlipidemia: Secondary | ICD-10-CM | POA: Diagnosis present

## 2019-10-16 DIAGNOSIS — K5900 Constipation, unspecified: Secondary | ICD-10-CM | POA: Diagnosis not present

## 2019-10-16 DIAGNOSIS — Z9689 Presence of other specified functional implants: Secondary | ICD-10-CM

## 2019-10-16 DIAGNOSIS — E1165 Type 2 diabetes mellitus with hyperglycemia: Secondary | ICD-10-CM | POA: Diagnosis present

## 2019-10-16 DIAGNOSIS — I110 Hypertensive heart disease with heart failure: Secondary | ICD-10-CM | POA: Diagnosis present

## 2019-10-16 DIAGNOSIS — Z95828 Presence of other vascular implants and grafts: Secondary | ICD-10-CM

## 2019-10-16 DIAGNOSIS — D62 Acute posthemorrhagic anemia: Secondary | ICD-10-CM | POA: Diagnosis not present

## 2019-10-16 DIAGNOSIS — Z87891 Personal history of nicotine dependence: Secondary | ICD-10-CM

## 2019-10-16 DIAGNOSIS — I480 Paroxysmal atrial fibrillation: Secondary | ICD-10-CM | POA: Diagnosis not present

## 2019-10-16 DIAGNOSIS — Z09 Encounter for follow-up examination after completed treatment for conditions other than malignant neoplasm: Secondary | ICD-10-CM

## 2019-10-16 DIAGNOSIS — I5032 Chronic diastolic (congestive) heart failure: Secondary | ICD-10-CM | POA: Diagnosis present

## 2019-10-16 DIAGNOSIS — I459 Conduction disorder, unspecified: Secondary | ICD-10-CM | POA: Diagnosis not present

## 2019-10-16 DIAGNOSIS — I472 Ventricular tachycardia: Secondary | ICD-10-CM | POA: Diagnosis not present

## 2019-10-16 DIAGNOSIS — Z952 Presence of prosthetic heart valve: Secondary | ICD-10-CM

## 2019-10-16 DIAGNOSIS — I712 Thoracic aortic aneurysm, without rupture, unspecified: Secondary | ICD-10-CM

## 2019-10-16 DIAGNOSIS — I77810 Thoracic aortic ectasia: Secondary | ICD-10-CM | POA: Diagnosis not present

## 2019-10-16 HISTORY — PX: AORTIC VALVE REPLACEMENT: SHX41

## 2019-10-16 HISTORY — PX: TEE WITHOUT CARDIOVERSION: SHX5443

## 2019-10-16 HISTORY — PX: MAZE: SHX5063

## 2019-10-16 HISTORY — PX: CLIPPING OF ATRIAL APPENDAGE: SHX5773

## 2019-10-16 HISTORY — PX: REPLACEMENT ASCENDING AORTA: SHX6068

## 2019-10-16 LAB — BASIC METABOLIC PANEL
Anion gap: 7 (ref 5–15)
Anion gap: 6 (ref 5–15)
BUN: 17 mg/dL (ref 8–23)
BUN: 14 mg/dL (ref 8–23)
CO2: 20 mmol/L — ABNORMAL LOW (ref 22–32)
CO2: 22 mmol/L (ref 22–32)
Calcium: 7.7 mg/dL — ABNORMAL LOW (ref 8.9–10.3)
Calcium: 8.3 mg/dL — ABNORMAL LOW (ref 8.9–10.3)
Chloride: 109 mmol/L (ref 98–111)
Chloride: 110 mmol/L (ref 98–111)
Creatinine, Ser: 0.89 mg/dL (ref 0.61–1.24)
Creatinine, Ser: 0.97 mg/dL (ref 0.61–1.24)
GFR calc Af Amer: >60 mL/min (ref >60)
GFR calc Af Amer: >60 mL/min (ref >60)
GFR calc non Af Amer: >60 mL/min (ref >60)
GFR calc non Af Amer: >60 mL/min (ref >60)
Glucose, Bld: 124 mg/dL — ABNORMAL HIGH (ref 70–99)
Glucose, Bld: 178 mg/dL — ABNORMAL HIGH (ref 70–99)
Potassium: 5.4 mmol/L — ABNORMAL HIGH (ref 3.5–5.1)
Potassium: 4.7 mmol/L (ref 3.5–5.1)
Sodium: 136 mmol/L (ref 135–145)
Sodium: 138 mmol/L (ref 135–145)

## 2019-10-16 LAB — GLUCOSE, CAPILLARY
Glucose-Capillary: 141 mg/dL — ABNORMAL HIGH (ref 70–99)
Glucose-Capillary: 128 mg/dL — ABNORMAL HIGH (ref 70–99)
Glucose-Capillary: 120 mg/dL — ABNORMAL HIGH (ref 70–99)
Glucose-Capillary: 129 mg/dL — ABNORMAL HIGH (ref 70–99)
Glucose-Capillary: 131 mg/dL — ABNORMAL HIGH (ref 70–99)
Glucose-Capillary: 149 mg/dL — ABNORMAL HIGH (ref 70–99)
Glucose-Capillary: 152 mg/dL — ABNORMAL HIGH (ref 70–99)
Glucose-Capillary: 160 mg/dL — ABNORMAL HIGH (ref 70–99)

## 2019-10-16 LAB — POCT I-STAT 7, (LYTES, BLD GAS, ICA,H+H)
Acid-base deficit: 2 mmol/L (ref 0.0–2.0)
Acid-base deficit: 2 mmol/L (ref 0.0–2.0)
Acid-base deficit: 2 mmol/L (ref 0.0–2.0)
Bicarbonate: 24.9 mmol/L (ref 20.0–28.0)
Bicarbonate: 23.7 mmol/L (ref 20.0–28.0)
Bicarbonate: 21.9 mmol/L (ref 20.0–28.0)
Calcium, Ion: 1.26 mmol/L (ref 1.15–1.40)
Calcium, Ion: 1.38 mmol/L (ref 1.15–1.40)
Calcium, Ion: 1.22 mmol/L (ref 1.15–1.40)
HCT: 35 % — ABNORMAL LOW (ref 39.0–52.0)
HCT: 34 % — ABNORMAL LOW (ref 39.0–52.0)
HCT: 36 % — ABNORMAL LOW (ref 39.0–52.0)
Hemoglobin: 11.9 g/dL — ABNORMAL LOW (ref 13.0–17.0)
Hemoglobin: 11.6 g/dL — ABNORMAL LOW (ref 13.0–17.0)
Hemoglobin: 12.2 g/dL — ABNORMAL LOW (ref 13.0–17.0)
O2 Saturation: 95 %
O2 Saturation: 95 %
O2 Saturation: 96 %
Patient temperature: 36.1
Patient temperature: 36.3
Patient temperature: 36.3
Potassium: 4.7 mmol/L (ref 3.5–5.1)
Potassium: 4.5 mmol/L (ref 3.5–5.1)
Potassium: 5.3 mmol/L — ABNORMAL HIGH (ref 3.5–5.1)
Sodium: 140 mmol/L (ref 135–145)
Sodium: 139 mmol/L (ref 135–145)
Sodium: 140 mmol/L (ref 135–145)
TCO2: 26 mmol/L (ref 22–32)
TCO2: 25 mmol/L (ref 22–32)
TCO2: 23 mmol/L (ref 22–32)
pCO2 arterial: 48.4 mmHg — ABNORMAL HIGH (ref 32.0–48.0)
pCO2 arterial: 42.4 mmHg (ref 32.0–48.0)
pCO2 arterial: 34.6 mmHg (ref 32.0–48.0)
pH, Arterial: 7.315 — ABNORMAL LOW (ref 7.350–7.450)
pH, Arterial: 7.352 (ref 7.350–7.450)
pH, Arterial: 7.407 (ref 7.350–7.450)
pO2, Arterial: 80 mmHg — ABNORMAL LOW (ref 83.0–108.0)
pO2, Arterial: 75 mmHg — ABNORMAL LOW (ref 83.0–108.0)
pO2, Arterial: 76 mmHg — ABNORMAL LOW (ref 83.0–108.0)

## 2019-10-16 LAB — CBC
HCT: 37.4 % — ABNORMAL LOW (ref 39.0–52.0)
Hemoglobin: 12.3 g/dL — ABNORMAL LOW (ref 13.0–17.0)
MCH: 30.2 pg (ref 26.0–34.0)
MCHC: 32.9 g/dL (ref 30.0–36.0)
MCV: 91.9 fL (ref 80.0–100.0)
Platelets: 143 10*3/uL — ABNORMAL LOW (ref 150–400)
RBC: 4.07 MIL/uL — ABNORMAL LOW (ref 4.22–5.81)
RDW: 13.3 % (ref 11.5–15.5)
WBC: 15.5 10*3/uL — ABNORMAL HIGH (ref 4.0–10.5)
nRBC: 0 % (ref 0.0–0.2)

## 2019-10-16 LAB — HEMOGLOBIN AND HEMATOCRIT, BLOOD
HCT: 35.5 % — ABNORMAL LOW (ref 39.0–52.0)
Hemoglobin: 11.6 g/dL — ABNORMAL LOW (ref 13.0–17.0)

## 2019-10-16 LAB — PLATELET COUNT: Platelets: 161 10*3/uL (ref 150–400)

## 2019-10-16 LAB — PREPARE RBC (CROSSMATCH)

## 2019-10-16 LAB — APTT: aPTT: 36 seconds (ref 24–36)

## 2019-10-16 LAB — MAGNESIUM: Magnesium: 2.3 mg/dL (ref 1.7–2.4)

## 2019-10-16 LAB — PROTIME-INR
INR: 1.4 — ABNORMAL HIGH (ref 0.8–1.2)
Prothrombin Time: 16.7 seconds — ABNORMAL HIGH (ref 11.4–15.2)

## 2019-10-16 IMAGING — DX DG CHEST 1V PORT
1 series · 1 of 1 positions shown · non-contrast
Comparison: [DATE]

CLINICAL DATA: Aortic valve replacement

EXAM:
PORTABLE CHEST 1 VIEW

[chest]
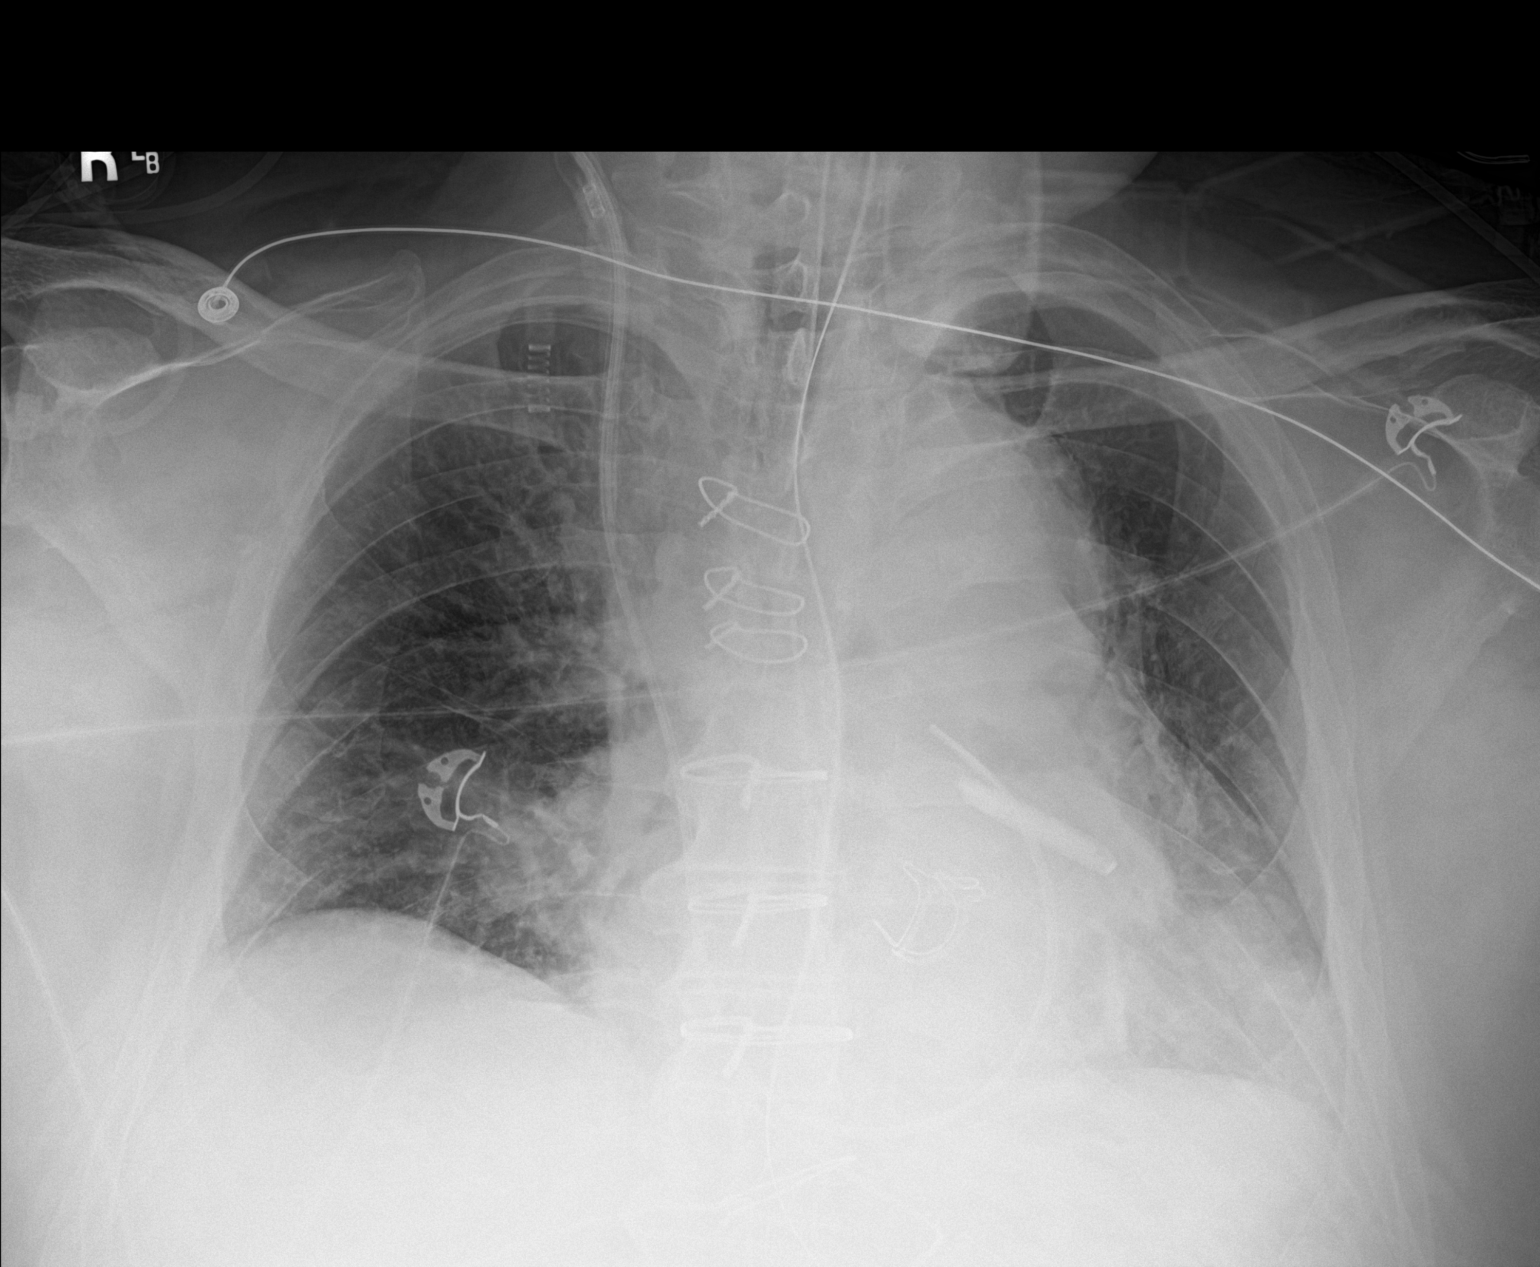

[1 of 1 positions shown; findings below may reference images not displayed]

FINDINGS: Interval postsurgical changes to the chest status post aortic valve
replacement. Endotracheal tube terminates approximately 6 cm
superior to the carina. Enteric tube courses below the diaphragm
with distal tip beyond the inferior margin of the film. Right IJ
approach Swan-Ganz catheter terminates at the level of the pulmonary
trunk. Atrial appendage clip. Median sternotomy wires.

Low lung volumes with stable heart size. Widening of the mediastinum
is likely postsurgical. Patchy bibasilar opacities favored to
reflect atelectasis. No pneumothorax is seen.
IMPRESSION: 1. Interval postsurgical changes to the chest status post aortic
valve replacement. Lines and tubes as above.
2. Patchy bibasilar opacities favored to reflect atelectasis. No
pneumothorax.

## 2019-10-16 SURGERY — REPLACEMENT, AORTIC VALVE, OPEN
Anesthesia: General | Site: Chest

## 2019-10-16 MED ORDER — SODIUM CHLORIDE 0.9 % IV SOLN: 250.0000 mL | INTRAVENOUS | Status: DC

## 2019-10-16 MED ORDER — LIDOCAINE 2% (20 MG/ML) 5 ML SYRINGE
INTRAMUSCULAR | Status: DC | PRN
  Administered 2019-10-16: 60 mg via INTRAVENOUS

## 2019-10-16 MED ORDER — MIDAZOLAM HCL 5 MG/5ML IJ SOLN
INTRAMUSCULAR | Status: DC | PRN
  Administered 2019-10-16: 3 mg via INTRAVENOUS
  Administered 2019-10-16 (×2): 2 mg via INTRAVENOUS
  Administered 2019-10-16: 1 mg via INTRAVENOUS
  Administered 2019-10-16: 2 mg via INTRAVENOUS

## 2019-10-16 MED ORDER — TRIAZOLAM 0.125 MG PO TABS
0.2500 mg | ORAL_TABLET | Freq: Every evening | ORAL | Status: DC | PRN
  Administered 2019-10-18 – 2019-10-22 (×5): 0.25 mg via ORAL
  Filled 2019-10-16 (×6): qty 2

## 2019-10-16 MED ORDER — EPHEDRINE 5 MG/ML INJ
INTRAVENOUS | Status: AC
  Filled 2019-10-16: qty 10

## 2019-10-16 MED ORDER — CHLORHEXIDINE GLUCONATE 0.12 % MT SOLN
OROMUCOSAL | Status: AC
  Administered 2019-10-16: 15 mL via OROMUCOSAL
  Filled 2019-10-16: qty 15

## 2019-10-16 MED ORDER — CHLORHEXIDINE GLUCONATE 4 % EX LIQD: 30.0000 mL | CUTANEOUS | Status: DC

## 2019-10-16 MED ORDER — POTASSIUM CHLORIDE 10 MEQ/50ML IV SOLN: 10.0000 meq | INTRAVENOUS | Status: AC

## 2019-10-16 MED ORDER — SODIUM CHLORIDE 0.9% FLUSH
10.0000 mL | INTRAVENOUS | Status: DC | PRN
  Administered 2019-10-17: 10 mL

## 2019-10-16 MED ORDER — CHLORHEXIDINE GLUCONATE CLOTH 2 % EX PADS
6.0000 | MEDICATED_PAD | Freq: Every day | CUTANEOUS | Status: DC
  Administered 2019-10-16 – 2019-10-23 (×7): 6 via TOPICAL

## 2019-10-16 MED ORDER — MORPHINE SULFATE (PF) 2 MG/ML IV SOLN
1.0000 mg | INTRAVENOUS | Status: DC | PRN
  Administered 2019-10-16 – 2019-10-17 (×4): 2 mg via INTRAVENOUS
  Administered 2019-10-17: 4 mg via INTRAVENOUS
  Filled 2019-10-16: qty 2
  Filled 2019-10-16 (×4): qty 1

## 2019-10-16 MED ORDER — DEXMEDETOMIDINE HCL IN NACL 400 MCG/100ML IV SOLN
0.0000 ug/kg/h | INTRAVENOUS | Status: DC
  Filled 2019-10-16: qty 100

## 2019-10-16 MED ORDER — METOPROLOL TARTRATE 12.5 MG HALF TABLET
12.5000 mg | ORAL_TABLET | Freq: Two times a day (BID) | ORAL | Status: DC
  Administered 2019-10-17 – 2019-10-23 (×13): 12.5 mg via ORAL
  Filled 2019-10-16 (×14): qty 1

## 2019-10-16 MED ORDER — HEMOSTATIC AGENTS (NO CHARGE) OPTIME
TOPICAL | Status: DC | PRN
  Administered 2019-10-16 (×8): 1 via TOPICAL

## 2019-10-16 MED ORDER — MAGNESIUM SULFATE 4 GM/100ML IV SOLN
INTRAVENOUS | Status: AC
  Administered 2019-10-16: 4 g via INTRAVENOUS
  Filled 2019-10-16: qty 100

## 2019-10-16 MED ORDER — PROTAMINE SULFATE 10 MG/ML IV SOLN
INTRAVENOUS | Status: DC | PRN
  Administered 2019-10-16: 170 mg via INTRAVENOUS
  Administered 2019-10-16: 150 mg via INTRAVENOUS
  Administered 2019-10-16: 30 mg via INTRAVENOUS

## 2019-10-16 MED ORDER — MIDAZOLAM HCL 2 MG/2ML IJ SOLN: 2.0000 mg | INTRAMUSCULAR | Status: DC | PRN

## 2019-10-16 MED ORDER — NOREPINEPHRINE 4 MG/250ML-% IV SOLN: 2.0000 ug/min | INTRAVENOUS | Status: DC

## 2019-10-16 MED ORDER — ACETAMINOPHEN 160 MG/5ML PO SOLN: 650.0000 mg | Freq: Once | ORAL | Status: AC

## 2019-10-16 MED ORDER — INSULIN REGULAR(HUMAN) IN NACL 100-0.9 UT/100ML-% IV SOLN
INTRAVENOUS | Status: DC
  Filled 2019-10-16: qty 100

## 2019-10-16 MED ORDER — ACETAMINOPHEN 160 MG/5ML PO SOLN: 1000.0000 mg | Freq: Four times a day (QID) | ORAL | Status: DC

## 2019-10-16 MED ORDER — TRANEXAMIC ACID 1000 MG/10ML IV SOLN
1.5000 mg/kg/h | INTRAVENOUS | Status: DC
  Filled 2019-10-16: qty 25

## 2019-10-16 MED ORDER — ROCURONIUM BROMIDE 10 MG/ML (PF) SYRINGE
PREFILLED_SYRINGE | INTRAVENOUS | Status: DC | PRN
  Administered 2019-10-16 (×3): 50 mg via INTRAVENOUS
  Administered 2019-10-16: 100 mg via INTRAVENOUS
  Administered 2019-10-16: 50 mg via INTRAVENOUS

## 2019-10-16 MED ORDER — DOCUSATE SODIUM 100 MG PO CAPS
200.0000 mg | ORAL_CAPSULE | Freq: Every day | ORAL | Status: DC
  Administered 2019-10-17 – 2019-10-18 (×2): 200 mg via ORAL
  Filled 2019-10-16 (×2): qty 2

## 2019-10-16 MED ORDER — SODIUM CHLORIDE 0.9 % IV SOLN
INTRAVENOUS | Status: DC
  Administered 2019-10-16: 10 mL/h via INTRAVENOUS

## 2019-10-16 MED ORDER — METOCLOPRAMIDE HCL 5 MG/ML IJ SOLN
10.0000 mg | Freq: Four times a day (QID) | INTRAMUSCULAR | Status: AC
  Administered 2019-10-16 – 2019-10-17 (×4): 10 mg via INTRAVENOUS
  Filled 2019-10-16 (×4): qty 2

## 2019-10-16 MED ORDER — ONDANSETRON HCL 4 MG/2ML IJ SOLN
4.0000 mg | Freq: Four times a day (QID) | INTRAMUSCULAR | Status: DC | PRN
  Administered 2019-10-16: 4 mg via INTRAVENOUS
  Filled 2019-10-16: qty 2

## 2019-10-16 MED ORDER — SODIUM CHLORIDE 0.9% FLUSH
3.0000 mL | Freq: Two times a day (BID) | INTRAVENOUS | Status: DC
  Administered 2019-10-17: 3 mL via INTRAVENOUS

## 2019-10-16 MED ORDER — ASPIRIN EC 325 MG PO TBEC
325.0000 mg | DELAYED_RELEASE_TABLET | Freq: Every day | ORAL | Status: DC
  Administered 2019-10-17 – 2019-10-23 (×7): 325 mg via ORAL
  Filled 2019-10-16 (×7): qty 1

## 2019-10-16 MED ORDER — SODIUM CHLORIDE 0.9 % IV SOLN
1.5000 g | Freq: Two times a day (BID) | INTRAVENOUS | Status: AC
  Administered 2019-10-16 – 2019-10-18 (×4): 1.5 g via INTRAVENOUS
  Filled 2019-10-16 (×4): qty 1.5

## 2019-10-16 MED ORDER — ROCURONIUM BROMIDE 10 MG/ML (PF) SYRINGE
PREFILLED_SYRINGE | INTRAVENOUS | Status: AC
  Filled 2019-10-16: qty 10

## 2019-10-16 MED ORDER — PANTOPRAZOLE SODIUM 40 MG PO TBEC
40.0000 mg | DELAYED_RELEASE_TABLET | Freq: Every day | ORAL | Status: DC
  Administered 2019-10-18: 40 mg via ORAL
  Filled 2019-10-16: qty 1

## 2019-10-16 MED ORDER — LACTATED RINGERS IV SOLN: 500.0000 mL | Freq: Once | INTRAVENOUS | Status: DC | PRN

## 2019-10-16 MED ORDER — ALFUZOSIN HCL ER 10 MG PO TB24
10.0000 mg | ORAL_TABLET | Freq: Every day | ORAL | Status: DC
  Administered 2019-10-17 – 2019-10-23 (×7): 10 mg via ORAL
  Filled 2019-10-16 (×8): qty 1

## 2019-10-16 MED ORDER — CALCIUM CHLORIDE 10 % IV SOLN
INTRAVENOUS | Status: DC | PRN
  Administered 2019-10-16 (×5): 100 mg via INTRAVENOUS

## 2019-10-16 MED ORDER — SODIUM CHLORIDE 0.9% FLUSH: 3.0000 mL | INTRAVENOUS | Status: DC | PRN

## 2019-10-16 MED ORDER — METOPROLOL TARTRATE 5 MG/5ML IV SOLN: 2.5000 mg | INTRAVENOUS | Status: DC | PRN

## 2019-10-16 MED ORDER — FENTANYL CITRATE (PF) 250 MCG/5ML IJ SOLN
INTRAMUSCULAR | Status: AC
  Filled 2019-10-16: qty 20

## 2019-10-16 MED ORDER — OXYCODONE HCL 5 MG PO TABS
5.0000 mg | ORAL_TABLET | ORAL | Status: DC | PRN
  Administered 2019-10-17 – 2019-10-18 (×6): 5 mg via ORAL
  Filled 2019-10-16 (×6): qty 1

## 2019-10-16 MED ORDER — ACETAMINOPHEN 500 MG PO TABS
1000.0000 mg | ORAL_TABLET | Freq: Four times a day (QID) | ORAL | Status: DC
  Administered 2019-10-17 – 2019-10-18 (×6): 1000 mg via ORAL
  Filled 2019-10-16 (×7): qty 2

## 2019-10-16 MED ORDER — ALBUMIN HUMAN 5 % IV SOLN
250.0000 mL | INTRAVENOUS | Status: AC | PRN
  Administered 2019-10-16 (×2): 12.5 g via INTRAVENOUS
  Filled 2019-10-16: qty 250

## 2019-10-16 MED ORDER — UMECLIDINIUM BROMIDE 62.5 MCG/INH IN AEPB
1.0000 | INHALATION_SPRAY | Freq: Every day | RESPIRATORY_TRACT | Status: DC
  Administered 2019-10-17 – 2019-10-23 (×7): 1 via RESPIRATORY_TRACT
  Filled 2019-10-16: qty 7

## 2019-10-16 MED ORDER — OXYCODONE HCL 5 MG PO TABS: 5.0000 mg | ORAL_TABLET | ORAL | Status: DC | PRN

## 2019-10-16 MED ORDER — ALBUMIN HUMAN 5 % IV SOLN: INTRAVENOUS | Status: DC | PRN

## 2019-10-16 MED ORDER — ATORVASTATIN CALCIUM 10 MG PO TABS
20.0000 mg | ORAL_TABLET | Freq: Every day | ORAL | Status: DC
  Administered 2019-10-17 – 2019-10-22 (×6): 20 mg via ORAL
  Filled 2019-10-16 (×6): qty 2

## 2019-10-16 MED ORDER — TRAMADOL HCL 50 MG PO TABS: 50.0000 mg | ORAL_TABLET | ORAL | Status: DC | PRN

## 2019-10-16 MED ORDER — SILVER SULFADIAZINE 1 % EX CREA
1.0000 "application " | TOPICAL_CREAM | Freq: Every day | CUTANEOUS | Status: DC
  Administered 2019-10-16 – 2019-10-17 (×2): 1 via TOPICAL
  Filled 2019-10-16: qty 85

## 2019-10-16 MED ORDER — LACTATED RINGERS IV SOLN: INTRAVENOUS | Status: DC | PRN

## 2019-10-16 MED ORDER — TIOTROPIUM BROMIDE MONOHYDRATE 1.25 MCG/ACT IN AERS: 2.0000 | INHALATION_SPRAY | Freq: Every day | RESPIRATORY_TRACT | Status: DC

## 2019-10-16 MED ORDER — VANCOMYCIN HCL IN DEXTROSE 1-5 GM/200ML-% IV SOLN
1000.0000 mg | Freq: Once | INTRAVENOUS | Status: AC
  Administered 2019-10-16: 1000 mg via INTRAVENOUS
  Filled 2019-10-16: qty 200

## 2019-10-16 MED ORDER — BISACODYL 10 MG RE SUPP: 10.0000 mg | Freq: Every day | RECTAL | Status: DC

## 2019-10-16 MED ORDER — PROPOFOL 10 MG/ML IV BOLUS
INTRAVENOUS | Status: AC
  Filled 2019-10-16: qty 20

## 2019-10-16 MED ORDER — PROTAMINE SULFATE 10 MG/ML IV SOLN
INTRAVENOUS | Status: AC
  Filled 2019-10-16: qty 25

## 2019-10-16 MED ORDER — MAGNESIUM SULFATE 4 GM/100ML IV SOLN: 4.0000 g | Freq: Once | INTRAVENOUS | Status: AC

## 2019-10-16 MED ORDER — METOPROLOL TARTRATE 12.5 MG HALF TABLET: 12.5000 mg | ORAL_TABLET | Freq: Once | ORAL | Status: DC

## 2019-10-16 MED ORDER — HEPARIN SODIUM (PORCINE) 1000 UNIT/ML IJ SOLN
INTRAMUSCULAR | Status: AC
  Filled 2019-10-16: qty 1

## 2019-10-16 MED ORDER — NITROGLYCERIN IN D5W 200-5 MCG/ML-% IV SOLN: 0.0000 ug/min | INTRAVENOUS | Status: DC

## 2019-10-16 MED ORDER — FENTANYL CITRATE (PF) 250 MCG/5ML IJ SOLN
INTRAMUSCULAR | Status: DC | PRN
  Administered 2019-10-16: 200 ug via INTRAVENOUS
  Administered 2019-10-16 (×2): 100 ug via INTRAVENOUS
  Administered 2019-10-16: 50 ug via INTRAVENOUS
  Administered 2019-10-16: 150 ug via INTRAVENOUS
  Administered 2019-10-16: 100 ug via INTRAVENOUS
  Administered 2019-10-16: 150 ug via INTRAVENOUS
  Administered 2019-10-16: 50 ug via INTRAVENOUS
  Administered 2019-10-16: 100 ug via INTRAVENOUS
  Administered 2019-10-16: 250 ug via INTRAVENOUS

## 2019-10-16 MED ORDER — MIDAZOLAM HCL (PF) 10 MG/2ML IJ SOLN
INTRAMUSCULAR | Status: AC
  Filled 2019-10-16: qty 2

## 2019-10-16 MED ORDER — EPHEDRINE SULFATE-NACL 50-0.9 MG/10ML-% IV SOSY
PREFILLED_SYRINGE | INTRAVENOUS | Status: DC | PRN
  Administered 2019-10-16 (×2): 2.5 mg via INTRAVENOUS

## 2019-10-16 MED ORDER — 0.9 % SODIUM CHLORIDE (POUR BTL) OPTIME
TOPICAL | Status: DC | PRN
  Administered 2019-10-16: 5000 mL

## 2019-10-16 MED ORDER — HEPARIN SODIUM (PORCINE) 1000 UNIT/ML IJ SOLN
INTRAMUSCULAR | Status: DC | PRN
  Administered 2019-10-16: 47000 [IU] via INTRAVENOUS

## 2019-10-16 MED ORDER — ARTIFICIAL TEARS OPHTHALMIC OINT
TOPICAL_OINTMENT | OPHTHALMIC | Status: AC
  Filled 2019-10-16: qty 3.5

## 2019-10-16 MED ORDER — SODIUM CHLORIDE 0.9 % IV SOLN: INTRAVENOUS | Status: DC | PRN

## 2019-10-16 MED ORDER — FENTANYL CITRATE (PF) 250 MCG/5ML IJ SOLN
INTRAMUSCULAR | Status: AC
  Filled 2019-10-16: qty 5

## 2019-10-16 MED ORDER — MILRINONE LACTATE IN DEXTROSE 20-5 MG/100ML-% IV SOLN
0.1250 ug/kg/min | INTRAVENOUS | Status: AC
  Administered 2019-10-17: 0.125 ug/kg/min via INTRAVENOUS
  Filled 2019-10-16: qty 100

## 2019-10-16 MED ORDER — LACTATED RINGERS IV SOLN: INTRAVENOUS | Status: DC

## 2019-10-16 MED ORDER — METOPROLOL TARTRATE 25 MG/10 ML ORAL SUSPENSION
12.5000 mg | Freq: Two times a day (BID) | ORAL | Status: DC
  Filled 2019-10-16 (×10): qty 5

## 2019-10-16 MED ORDER — ACETAMINOPHEN 650 MG RE SUPP
650.0000 mg | Freq: Once | RECTAL | Status: AC
  Administered 2019-10-16: 650 mg via RECTAL

## 2019-10-16 MED ORDER — FAMOTIDINE IN NACL 20-0.9 MG/50ML-% IV SOLN
20.0000 mg | Freq: Two times a day (BID) | INTRAVENOUS | Status: AC
  Administered 2019-10-17: 20 mg via INTRAVENOUS
  Filled 2019-10-16 (×2): qty 50

## 2019-10-16 MED ORDER — DEXTROSE 50 % IV SOLN: 0.0000 mL | INTRAVENOUS | Status: DC | PRN

## 2019-10-16 MED ORDER — SODIUM CHLORIDE 0.45 % IV SOLN: INTRAVENOUS | Status: DC | PRN

## 2019-10-16 MED ORDER — PHENYLEPHRINE HCL-NACL 20-0.9 MG/250ML-% IV SOLN: 0.0000 ug/min | INTRAVENOUS | Status: DC

## 2019-10-16 MED ORDER — CHLORHEXIDINE GLUCONATE 0.12 % MT SOLN
15.0000 mL | OROMUCOSAL | Status: AC
  Administered 2019-10-16: 15 mL via OROMUCOSAL

## 2019-10-16 MED ORDER — CHLORHEXIDINE GLUCONATE 0.12 % MT SOLN: 15.0000 mL | Freq: Once | OROMUCOSAL | Status: AC

## 2019-10-16 MED ORDER — SODIUM CHLORIDE 0.9% IV SOLUTION: Freq: Once | INTRAVENOUS | Status: DC

## 2019-10-16 MED ORDER — REVEFENACIN 175 MCG/3ML IN SOLN
175.0000 ug | Freq: Every day | RESPIRATORY_TRACT | Status: DC
  Administered 2019-10-16 – 2019-10-22 (×7): 175 ug via RESPIRATORY_TRACT
  Filled 2019-10-16 (×9): qty 3

## 2019-10-16 MED ORDER — PROPOFOL 10 MG/ML IV BOLUS
INTRAVENOUS | Status: DC | PRN
  Administered 2019-10-16: 20 mg via INTRAVENOUS
  Administered 2019-10-16: 40 mg via INTRAVENOUS
  Administered 2019-10-16: 30 mg via INTRAVENOUS
  Administered 2019-10-16: 50 mg via INTRAVENOUS

## 2019-10-16 MED ORDER — BISACODYL 5 MG PO TBEC
10.0000 mg | DELAYED_RELEASE_TABLET | Freq: Every day | ORAL | Status: DC
  Administered 2019-10-17 – 2019-10-18 (×2): 10 mg via ORAL
  Filled 2019-10-16 (×2): qty 2

## 2019-10-16 MED ORDER — PHENYLEPHRINE 40 MCG/ML (10ML) SYRINGE FOR IV PUSH (FOR BLOOD PRESSURE SUPPORT)
PREFILLED_SYRINGE | INTRAVENOUS | Status: AC
  Filled 2019-10-16: qty 20

## 2019-10-16 MED ORDER — SODIUM CHLORIDE 0.9% FLUSH
10.0000 mL | Freq: Two times a day (BID) | INTRAVENOUS | Status: DC
  Administered 2019-10-16 – 2019-10-17 (×2): 10 mL

## 2019-10-16 MED ORDER — PHENYLEPHRINE 40 MCG/ML (10ML) SYRINGE FOR IV PUSH (FOR BLOOD PRESSURE SUPPORT)
PREFILLED_SYRINGE | INTRAVENOUS | Status: DC | PRN
  Administered 2019-10-16: 80 ug via INTRAVENOUS
  Administered 2019-10-16: 120 ug via INTRAVENOUS
  Administered 2019-10-16 (×2): 80 ug via INTRAVENOUS
  Administered 2019-10-16: 160 ug via INTRAVENOUS
  Administered 2019-10-16: 80 ug via INTRAVENOUS
  Administered 2019-10-16: 120 ug via INTRAVENOUS

## 2019-10-16 MED ORDER — ASPIRIN 81 MG PO CHEW: 324.0000 mg | CHEWABLE_TABLET | Freq: Every day | ORAL | Status: DC

## 2019-10-16 SURGICAL SUPPLY — 131 items
ADAPTER CARDIO PERF ANTE/RETRO (ADAPTER) ×3 IMPLANT
ADPR PRFSN 84XANTGRD RTRGD (ADAPTER) ×2
AGENT HMST KT MTR STRL THRMB (HEMOSTASIS) ×2
APL SRG 7X2 LUM MLBL SLNT (VASCULAR PRODUCTS)
APL SWBSTK 6 STRL LF DISP (MISCELLANEOUS)
APPLICATOR COTTON TIP 6 STRL (MISCELLANEOUS) IMPLANT
APPLICATOR COTTON TIP 6IN STRL (MISCELLANEOUS)
APPLICATOR TIP COSEAL (VASCULAR PRODUCTS) IMPLANT
ATRICLIP EXCLUSION VLAA 50 (Miscellaneous) ×3 IMPLANT
BAG DECANTER FOR FLEXI CONT (MISCELLANEOUS) ×2 IMPLANT
BLADE CLIPPER SURG (BLADE) ×3 IMPLANT
BLADE STERNUM SYSTEM 6 (BLADE) ×3 IMPLANT
BLADE SURG 15 STRL LF DISP TIS (BLADE) ×2 IMPLANT
BLADE SURG 15 STRL SS (BLADE) ×3
BOOT SUTURE AID YELLOW STND (SUTURE) IMPLANT
CANISTER SUCT 3000ML PPV (MISCELLANEOUS) ×3 IMPLANT
CANNULA GUNDRY RCSP 15FR (MISCELLANEOUS) ×3 IMPLANT
CARDIOBLATE CARDIAC ABLATION (MISCELLANEOUS)
CATH CPB KIT GERHARDT (MISCELLANEOUS) ×3 IMPLANT
CATH FOLEY 2WAY SLVR 18FR 30CC (CATHETERS) IMPLANT
CATH HEART VENT LEFT (CATHETERS) ×2 IMPLANT
CATH ROBINSON RED A/P 18FR (CATHETERS) ×1 IMPLANT
CATH THORACIC 28FR (CATHETERS) ×3 IMPLANT
CATH/SQUID NICHOLS JEHLE COR (CATHETERS) ×1 IMPLANT
CAUTERY SURG HI TEMP FINE TIP (MISCELLANEOUS) ×1 IMPLANT
CLAMP ISOLATOR SYNERGY LG (MISCELLANEOUS) ×1 IMPLANT
CNTNR URN SCR LID CUP LEK RST (MISCELLANEOUS) IMPLANT
CONN 3/8X3/8 GISH STERILE (MISCELLANEOUS) ×1 IMPLANT
CONN ST 1/4X3/8  BEN (MISCELLANEOUS) ×6
CONN ST 1/4X3/8 BEN (MISCELLANEOUS) IMPLANT
CONT SPEC 4OZ STRL OR WHT (MISCELLANEOUS) ×6
COVER PROBE W GEL 5X96 (DRAPES) ×1 IMPLANT
DEVICE CARDIOBLATE CARDIAC ABL (MISCELLANEOUS) IMPLANT
DEVICE EXCLUSIN ATRCLP VLAA 50 (Miscellaneous) IMPLANT
DRAIN CHANNEL 28F RND 3/8 FF (WOUND CARE) ×3 IMPLANT
DRAPE CARDIOVASCULAR INCISE (DRAPES) ×3
DRAPE HALF SHEET 40X57 (DRAPES) ×1 IMPLANT
DRAPE SLUSH/WARMER DISC (DRAPES) ×1 IMPLANT
DRAPE SRG 135X102X78XABS (DRAPES) ×2 IMPLANT
DRSG AQUACEL AG ADV 3.5X 4 (GAUZE/BANDAGES/DRESSINGS) ×1 IMPLANT
DRSG AQUACEL AG ADV 3.5X 6 (GAUZE/BANDAGES/DRESSINGS) ×1 IMPLANT
DRSG AQUACEL AG ADV 3.5X14 (GAUZE/BANDAGES/DRESSINGS) ×3 IMPLANT
ELECT BLADE 4.0 EZ CLEAN MEGAD (MISCELLANEOUS) ×6
ELECT CAUTERY BLADE 6.4 (BLADE) ×2 IMPLANT
ELECT REM PT RETURN 9FT ADLT (ELECTROSURGICAL) ×6
ELECTRODE BLDE 4.0 EZ CLN MEGD (MISCELLANEOUS) ×2 IMPLANT
ELECTRODE REM PT RTRN 9FT ADLT (ELECTROSURGICAL) ×4 IMPLANT
FELT TEFLON 1X6 (MISCELLANEOUS) ×6 IMPLANT
FILTER SMOKE EVAC ULPA (FILTER) ×3 IMPLANT
GAUZE SPONGE 4X4 12PLY STRL (GAUZE/BANDAGES/DRESSINGS) ×5 IMPLANT
GAUZE SPONGE 4X4 16PLY XRAY LF (GAUZE/BANDAGES/DRESSINGS) ×1 IMPLANT
GLOVE BIO SURGEON STRL SZ 6 (GLOVE) ×2 IMPLANT
GLOVE BIO SURGEON STRL SZ 6.5 (GLOVE) ×11 IMPLANT
GLOVE BIOGEL PI IND STRL 6 (GLOVE) IMPLANT
GLOVE BIOGEL PI IND STRL 6.5 (GLOVE) IMPLANT
GLOVE BIOGEL PI IND STRL 7.5 (GLOVE) IMPLANT
GLOVE BIOGEL PI IND STRL 8 (GLOVE) IMPLANT
GLOVE BIOGEL PI INDICATOR 6 (GLOVE) ×1
GLOVE BIOGEL PI INDICATOR 6.5 (GLOVE) ×4
GLOVE BIOGEL PI INDICATOR 7.5 (GLOVE) ×4
GLOVE BIOGEL PI INDICATOR 8 (GLOVE) ×1
GOWN STRL REUS W/ TWL LRG LVL3 (GOWN DISPOSABLE) ×8 IMPLANT
GOWN STRL REUS W/TWL LRG LVL3 (GOWN DISPOSABLE) ×18
GRAFT HEMASHIELD 10MM (Graft) ×3 IMPLANT
GRAFT VASC STRG 30X10STRL (Graft) IMPLANT
GRAFT WOVEN D/V 34DX30L (Vascular Products) ×1 IMPLANT
GRAFT WOVEN D/V 38DX30L (Vascular Products) ×1 IMPLANT
HEMOSTAT POWDER SURGIFOAM 1G (HEMOSTASIS) ×9 IMPLANT
HEMOSTAT SURGICEL 2X14 (HEMOSTASIS) ×4 IMPLANT
INSERT FOGARTY XLG (MISCELLANEOUS) ×3 IMPLANT
KIT BASIN OR (CUSTOM PROCEDURE TRAY) ×3 IMPLANT
KIT CATH SUCT 8FR (CATHETERS) ×1 IMPLANT
KIT SUCTION CATH 14FR (SUCTIONS) ×9 IMPLANT
KIT TURNOVER KIT B (KITS) ×3 IMPLANT
LEAD PACING MYOCARDI (MISCELLANEOUS) ×3 IMPLANT
LINE VENT (MISCELLANEOUS) ×1 IMPLANT
LOOP VESSEL SUPERMAXI WHITE (MISCELLANEOUS) ×1 IMPLANT
MARKER SKIN DUAL TIP RULER LAB (MISCELLANEOUS) ×1 IMPLANT
NS IRRIG 1000ML POUR BTL (IV SOLUTION) ×15 IMPLANT
PACK E OPEN HEART (SUTURE) ×3 IMPLANT
PACK OPEN HEART (CUSTOM PROCEDURE TRAY) ×3 IMPLANT
PAD ARMBOARD 7.5X6 YLW CONV (MISCELLANEOUS) ×6 IMPLANT
PENCIL SMOKE EVACUATOR (MISCELLANEOUS) ×3 IMPLANT
POSITIONER HEAD DONUT 9IN (MISCELLANEOUS) ×3 IMPLANT
PROBE CRYO2-ABLATION MALLABLE (MISCELLANEOUS) IMPLANT
RELOAD STAPLE 30 GRY VASC (STAPLE) IMPLANT
RELOAD TRI 2.0 30 VAS GRAY SUL (STAPLE) ×3 IMPLANT
SEALANT SURG COSEAL 8ML (VASCULAR PRODUCTS) ×4 IMPLANT
SET CARDIOPLEGIA MPS 5001102 (MISCELLANEOUS) ×1 IMPLANT
SLEEVE SUCTION 125 (MISCELLANEOUS) ×3 IMPLANT
SPONGE LAP 18X18 RF (DISPOSABLE) ×2 IMPLANT
SPONGE LAP 4X18 RFD (DISPOSABLE) ×1 IMPLANT
STAPLER ENDO GIA 12 SHRT THIN (STAPLE) IMPLANT
STAPLER ENDO GIA 12MM SHORT (STAPLE) ×3 IMPLANT
SURGIFLO W/THROMBIN 8M KIT (HEMOSTASIS) ×1 IMPLANT
SUT BONE WAX W31G (SUTURE) ×3 IMPLANT
SUT ETHIBON 2 0 V 52N 30 (SUTURE) ×6 IMPLANT
SUT ETHIBOND 2 0 SH (SUTURE)
SUT ETHIBOND 2 0 SH 36X2 (SUTURE) ×2 IMPLANT
SUT PROLENE 3 0 RB 1 (SUTURE) ×3 IMPLANT
SUT PROLENE 3 0 SH 48 (SUTURE) ×2 IMPLANT
SUT PROLENE 3 0 SH1 36 (SUTURE) ×6 IMPLANT
SUT PROLENE 4 0 RB 1 (SUTURE) ×27
SUT PROLENE 4-0 RB1 .5 CRCL 36 (SUTURE) ×4 IMPLANT
SUT PROLENE 5 0 C 1 36 (SUTURE) ×2 IMPLANT
SUT SILK 2 0 SH CR/8 (SUTURE) ×1 IMPLANT
SUT SILK 3 0 (SUTURE) ×3
SUT SILK 3-0 18XBRD TIE 12 (SUTURE) IMPLANT
SUT SILK 4 0 (SUTURE) ×3
SUT SILK 4-0 18XBRD TIE 12 (SUTURE) IMPLANT
SUT STEEL 6MS V (SUTURE) ×3 IMPLANT
SUT STEEL SZ 6 DBL 3X14 BALL (SUTURE) ×3 IMPLANT
SUT VIC AB 1 CTX 18 (SUTURE) ×6 IMPLANT
SUT VIC AB 2-0 CT1 27 (SUTURE) ×3
SUT VIC AB 2-0 CT1 TAPERPNT 27 (SUTURE) IMPLANT
SUT VIC AB 2-0 CTX 36 (SUTURE) ×1 IMPLANT
SUT VIC AB 3-0 X1 27 (SUTURE) ×1 IMPLANT
SYSTEM SAHARA CHEST DRAIN ATS (WOUND CARE) ×3 IMPLANT
TAPE CLOTH SURG 4X10 WHT LF (GAUZE/BANDAGES/DRESSINGS) ×1 IMPLANT
TAPE PAPER 2X10 WHT MICROPORE (GAUZE/BANDAGES/DRESSINGS) ×1 IMPLANT
TOWEL GREEN STERILE (TOWEL DISPOSABLE) ×3 IMPLANT
TOWEL GREEN STERILE FF (TOWEL DISPOSABLE) ×3 IMPLANT
TRAP FLUID SMOKE EVACUATOR (MISCELLANEOUS) ×3 IMPLANT
TRAY CATH LUMEN 1 20CM STRL (SET/KITS/TRAYS/PACK) ×1 IMPLANT
TRAY FOLEY SLVR 16FR TEMP STAT (SET/KITS/TRAYS/PACK) ×3 IMPLANT
TUBING ART PRESS 72  MALE/FEM (TUBING) ×6
TUBING ART PRESS 72 MALE/FEM (TUBING) IMPLANT
UNDERPAD 30X30 (UNDERPADS AND DIAPERS) ×3 IMPLANT
VALVE MAGNA EASE AORTIC 25MM (Prosthesis & Implant Heart) ×1 IMPLANT
VENT LEFT HEART 12002 (CATHETERS) ×3
WATER STERILE IRR 1000ML POUR (IV SOLUTION) ×6 IMPLANT

## 2019-10-16 NOTE — Anesthesia Procedure Notes (Signed)
Arterial Line Insertion Start/End5/08/2019 7:00 AM, 10/16/2019 7:10 AM Performed by: Barrington Ellison, CRNA, CRNA  Patient location: Pre-op. Preanesthetic checklist: patient identified and pre-op evaluation Lidocaine 1% used for infiltration and patient sedated Left was placed Catheter size: 20 G Hand hygiene performed  and maximum sterile barriers used  Allen's test indicative of satisfactory collateral circulation Attempts: 2 Procedure performed without using ultrasound guided technique. Following insertion, dressing applied and Biopatch. Post procedure assessment: normal  Patient tolerated the procedure well with no immediate complications.

## 2019-10-16 NOTE — Anesthesia Postprocedure Evaluation (Signed)
Anesthesia Post Note  Patient: Clinton Black  Procedure(s) Performed: AORTIC VALVE REPLACEMENT (AVR) using Edwards PERIMOUNT Magna Ease 25MM Bioprosthesis Aortic Valve. (N/A Chest) SUPRA CORONARY REPLACEMENT OF ASCENDING AORTA using Maquet Hemashiel Platinum 34 MM Vascular Graft. (N/A Chest) MAZE with bilateral pulmonary vein isolation. (N/A Chest) TRANSESOPHAGEAL ECHOCARDIOGRAM (TEE) (N/A ) Clipping Of Atrial Appendage using AtriCure AtriClip Exclusion VLAA 69mm. (N/A Chest)     Patient location during evaluation: SICU Anesthesia Type: General Level of consciousness: sedated Pain management: pain level controlled Vital Signs Assessment: post-procedure vital signs reviewed and stable Respiratory status: patient remains intubated per anesthesia plan Cardiovascular status: stable Postop Assessment: no apparent nausea or vomiting Anesthetic complications: no    Last Vitals:  Vitals:   10/16/19 0713 10/16/19 1534  BP:  (!) 89/52  Pulse: 64 90  Resp: 13 12  Temp:    SpO2: 96% 93%    Last Pain:  Vitals:   10/16/19 0613  PainSc: 0-No pain                  

## 2019-10-16 NOTE — Anesthesia Preprocedure Evaluation (Signed)
Anesthesia Evaluation  Patient identified by MRN, date of birth, ID band Patient awake    Reviewed: Allergy & Precautions, NPO status , Patient's Chart, lab work & pertinent test results  History of Anesthesia Complications Negative for: history of anesthetic complications  Airway Mallampati: III  TM Distance: >3 FB Neck ROM: Full    Dental  (+) Dental Advisory Given, Teeth Intact   Pulmonary neg shortness of breath, neg sleep apnea, neg COPD, neg recent URI, former smoker,    breath sounds clear to auscultation       Cardiovascular hypertension, Pt. on medications and Pt. on home beta blockers (-) angina+ Peripheral Vascular Disease and +CHF  (-) CAD + Valvular Problems/Murmurs AI  Rhythm:Regular  2021 tte: 1. Left ventricular ejection fraction, by estimation, is 60%. The left  ventricle has normal function. The left ventricle has no regional wall  motion abnormalities. The left ventricular internal cavity size was mildly  dilated. There is mildly increased  left ventricular hypertrophy. Left ventricular diastolic parameters are  indeterminate.  2. Right ventricular systolic function is normal. The right ventricular  size is normal. Tricuspid regurgitation signal is inadequate for assessing  PA pressure.  3. The mitral valve is normal in structure and function. No evidence of  mitral valve regurgitation. No evidence of mitral stenosis.  4. The aortic valve is grossly normal. Aortic valve regurgitation is  moderate. No aortic stenosis is present.  5. Aortic dilatation noted. 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  6. The inferior vena cava is normal in size with greater than 50%  respiratory variability, suggesting right atrial pressure of 3 mmHg.    Neuro/Psych negative neurological ROS     GI/Hepatic negative GI ROS, (+)     substance abuse  marijuana use,   Endo/Other   diabetesMorbid obesity  Renal/GU      Musculoskeletal  (+) Arthritis ,   Abdominal   Peds  Hematology Hgb 14.4, Plt 196   Anesthesia Other Findings   Reproductive/Obstetrics                             Anesthesia Physical Anesthesia Plan  ASA: IV  Anesthesia Plan: General   Post-op Pain Management:    Induction: Intravenous  PONV Risk Score and Plan: 2  Airway Management Planned: Oral ETT  Additional Equipment: Arterial line, CVP, PA Cath, TEE and Ultrasound Guidance Line Placement  Intra-op Plan:   Post-operative Plan: Post-operative intubation/ventilation  Informed Consent: I have reviewed the patients History and Physical, chart, labs and discussed the procedure including the risks, benefits and alternatives for the proposed anesthesia with the patient or authorized representative who has indicated his/her understanding and acceptance.     Dental advisory given  Plan Discussed with: CRNA and Surgeon  Anesthesia Plan Comments:         Anesthesia Quick Evaluation

## 2019-10-16 NOTE — Brief Op Note (Signed)
      [address]       [place] [address]             [phone]    10/16/2019  4:47 PM  PATIENT:  Clinton Black  74 y.o. male  PRE-OPERATIVE DIAGNOSIS:  AORTIC insufficiency  THORACIC AORTIC ANEURYSM ATRIAL FIBRILLATION-  History of   POST-OPERATIVE DIAGNOSIS: same   PROCEDURE:  Procedure(s) with comments:  AORTIC VALVE REPLACEMENT (AVR) using Edwards PERIMOUNT Magna Ease 25MM Bioprosthesis Aortic Valve. (N/A)   supra coronary REPLACEMENT ASCENDING AORTA (N/A)- 34 mm graft   MODIFIED MAZE  -Bilateral Pulmonary Vein Isolation with RF Ablation -Ablation and Clipping LA Appendage     -AtriCure AtriClip Exclusion VLAA 57mm. (N/A)  TRANSESOPHAGEAL ECHOCARDIOGRAM (TEE) (N/A)   SURGEON:  Surgeon(s) and Role:    * [name], MD - Primary  PHYSICIAN ASSISTANT: Ellwood Handler PA-C  ANESTHESIA:   general  EBL:  800 mL   BLOOD ADMINISTERED: CELLSAVER  DRAINS: Mediastinal Chest Drains   LOCAL MEDICATIONS USED:  NONE  SPECIMEN:  Source of Specimen:  Aortic Valve Leaflets, Aortic Aneurysm  DISPOSITION OF SPECIMEN:  PATHOLOGY  COUNTS:  YES   DICTATION: .Dragon Dictation  PLAN OF CARE: Admit to inpatient   PATIENT DISPOSITION:  ICU - intubated and hemodynamically stable.   Delay start of Pharmacological VTE agent (>24hrs) due to surgical blood loss or risk of bleeding: yes

## 2019-10-16 NOTE — Progress Notes (Signed)
CT Surgery  Blood pressure 134/83, pulse 88, temperature (!) 97.3 F (36.3 C), resp. rate 15, height 6' (1.829 m), weight 134.7 kg, SpO2 95 %.  Just extubated- VC 1 liter Responsive A-paced 90, CI 2.5 Moves all extremities followup postextubation ABG in 30 min

## 2019-10-16 NOTE — Transfer of Care (Signed)
Immediate Anesthesia Transfer of Care Note  Patient: Clinton Black  Procedure(s) Performed: AORTIC VALVE REPLACEMENT (AVR) using Edwards PERIMOUNT Magna Ease 25MM Bioprosthesis Aortic Valve. (N/A Chest) SUPRA CORONARY REPLACEMENT OF ASCENDING AORTA using Maquet Hemashiel Platinum 34 MM Vascular Graft. (N/A Chest) MAZE with bilateral pulmonary vein isolation. (N/A Chest) TRANSESOPHAGEAL ECHOCARDIOGRAM (TEE) (N/A ) Clipping Of Atrial Appendage using AtriCure AtriClip Exclusion VLAA 20mm. (N/A Chest)  Patient Location: ICU  Anesthesia Type:General  Level of Consciousness: Patient remains intubated per anesthesia plan  Airway & Oxygen Therapy: Patient remains intubated per anesthesia plan  Post-op Assessment: Report given to RN and Post -op Vital signs reviewed and stable  Post vital signs: Reviewed and stable  Last Vitals:  Vitals Value Taken Time  BP    Temp    Pulse    Resp    SpO2      Last Pain:  Vitals:   10/16/19 0613  PainSc: 0-No pain         Complications: No apparent anesthesia complications

## 2019-10-16 NOTE — Procedures (Signed)
Extubation Procedure Note  Patient Details:   Name: Clinton Black DOB: 1945-08-08 MRN: [id number]   Airway Documentation:    Vent end date: 10/16/19 Vent end time: 1844   Evaluation  O2 sats: stable throughout Complications: No apparent complications Patient did tolerate procedure well. Bilateral Breath Sounds: Rhonchi, Diminished   Yes   Pt extubated to 4L . VC was 1L and NIF was -20. Cuff leak was positive. No stridor noted.  Vilinda Blanks 10/16/2019, 6:44 PM

## 2019-10-16 NOTE — Anesthesia Procedure Notes (Signed)
Procedure Name: Intubation Date/Time: 10/16/2019 7:56 AM Performed by: Barrington Ellison, CRNA Pre-anesthesia Checklist: Patient identified, Emergency Drugs available, Suction available and Patient being monitored Patient Re-evaluated:Patient Re-evaluated prior to induction Oxygen Delivery Method: Circle System Utilized Preoxygenation: Pre-oxygenation with 100% oxygen Induction Type: IV induction Ventilation: Mask ventilation without difficulty Laryngoscope Size: Mac and 4 Grade View: Grade II Tube type: Oral Tube size: 8.0 mm Number of attempts: 2 Airway Equipment and Method: Stylet and Oral airway Placement Confirmation: ETT inserted through vocal cords under direct vision,  positive ETCO2 and breath sounds checked- equal and bilateral Secured at: 23 cm Tube secured with: Tape Dental Injury: Teeth and Oropharynx as per pre-operative assessment  Comments: Performed by Hollice Espy, SRNA

## 2019-10-16 NOTE — Anesthesia Procedure Notes (Signed)
Central Venous Catheter Insertion Performed by: Nolon Nations, MD, anesthesiologist Patient location: Pre-op. Preanesthetic checklist: patient identified, IV checked, site marked, risks and benefits discussed, surgical consent, monitors and equipment checked, pre-op evaluation, timeout performed and anesthesia consent Position: Trendelenburg Lidocaine 1% used for infiltration and patient sedated Hand hygiene performed  and maximum sterile barriers used  Catheter size: 9 Fr Central line and PA cath was placed.MAC introducer Swan type:thermodilution PA Cath depth:50 Procedure performed using ultrasound guided technique. Ultrasound Notes:anatomy identified, needle tip was noted to be adjacent to the nerve/plexus identified, no ultrasound evidence of intravascular and/or intraneural injection and image(s) printed for medical record Attempts: 1 Following insertion, line sutured, dressing applied and Biopatch. Post procedure assessment: blood return through all ports, free fluid flow and no air  Patient tolerated the procedure well with no immediate complications.

## 2019-10-16 NOTE — Plan of Care (Signed)
  Problem: Education: Goal: Will demonstrate proper wound care and an understanding of methods to prevent future damage Outcome: Progressing Goal: Knowledge of disease or condition will improve Outcome: Progressing   Problem: Activity: Goal: Risk for activity intolerance will decrease Outcome: Progressing   Problem: Cardiac: Goal: Will achieve and/or maintain hemodynamic stability Outcome: Progressing   Problem: Clinical Measurements: Goal: Postoperative complications will be avoided or minimized Outcome: Progressing   Problem: Respiratory: Goal: Respiratory status will improve Outcome: Progressing   Problem: Skin Integrity: Goal: Wound healing without signs and symptoms of infection Outcome: Progressing Goal: Risk for impaired skin integrity will decrease Outcome: Progressing   Problem: Skin Integrity: Goal: Risk for impaired skin integrity will decrease Outcome: Progressing   Problem: Urinary Elimination: Goal: Ability to achieve and maintain adequate renal perfusion and functioning will improve Outcome: Progressing

## 2019-10-16 NOTE — H&P (Signed)
[address]       [place] [address]             [phone]                    Sherril P Knechtel [facility] Medical Record [id number] Date of Birth: 03-19-1946 Referring: [name], [name], MD Primary Care: [name], MD  Chief Complaint:        Chief Complaint  Patient presents with  . Thoracic Aortic Aneurysm and AI, Previous AFIB     Dilated ascending aorta, further discuss surgery     History of Present Illness:    Clinton Black 74 y.o. male who has been  followed in office for ascending aortic dilatation and aortic insufficiency.  He notes recently his urologist Dr. Loel Lofty called him and noted that his PSA had been rising and was recommending a biopsy in the near future.   He is now status post AF ablation 04/07/2019.  Unfortunately he did go back into atrial fibrillation then noted by cardiac monitoring.  He is currently on amiodarone.  He had his cardioversion 06/28/2019.  Rhythm strip in the office today revealed the patient was in sinus rhythm at approximately 60   Patient has no family history of aortic dissection he does note that his father died at age 71 suddenly with a cerebral embolus mother died at age 34 he is had one sister died at age 39 of breast cancer and another sister has had breast cancer in atrial fib still alive patient has no children no family history of aortic dissections or aneurysm.  When initially the patient was converted to sinus rhythm he noted significant improvement in his overall symptoms.  Now in spite of being in sinus rhythm he notes increasing shortness of breath with exertion.  He is able to maintain usual activities around the house he does care for his fiance who has significant neuropathy and needs extensive care.   With known AI he has had increasing SOB with exertion. Class C heart failure symptoms    Current Activity/ Functional Status:  Patient is independent with mobility/ambulation, transfers,  ADL's, IADL's.   Zubrod Score: At the time of surgery this patient's most appropriate activity status/level should be described as: []     0    Normal activity, no symptoms []     1    Restricted in physical strenuous activity but ambulatory, able to do out light work [x]     2    Ambulatory and capable of self care, unable to do work activities, up and about               >50 % of waking hours                              []     3    Only limited self care, in bed greater than 50% of waking hours []     4    Completely disabled, no self care, confined to bed or chair []     5    Moribund   Past Medical History:  Diagnosis Date  . Arthritis   . Atrial fibrillation ([place])   . BPH (benign prostatic hyperplasia)   . Diabetes mellitus type 2 in obese (HCC)    diet controlled  . HTN (hypertension)   . Hyperlipidemia, mixed   . Insomnia   .  Obesity   . Thoracic aortic aneurysm, without rupture (HCC)    4.7 cm per chest ct with contrast 11-04-17    Past Surgical History:  Procedure Laterality Date  . ATRIAL FIBRILLATION ABLATION N/A 04/07/2019   Procedure: ATRIAL FIBRILLATION ABLATION;  Surgeon: [name], MD;  Location: [place] [facility];  Service: Cardiovascular;  Laterality: N/A;  . CARDIOVERSION N/A 07/12/2018   Procedure: CARDIOVERSION;  Surgeon: Thayer Headings, MD;  Location: [facility] ENDOSCOPY;  Service: Cardiovascular;  Laterality: N/A;  . CARDIOVERSION N/A 08/22/2018   Procedure: CARDIOVERSION;  Surgeon: Skeet Latch, MD;  Location: [facility];  Service: Cardiovascular;  Laterality: N/A;  . CARDIOVERSION N/A 06/28/2019   Procedure: CARDIOVERSION;  Surgeon: Thayer Headings, MD;  Location: [facility] ENDOSCOPY;  Service: Cardiovascular;  Laterality: N/A;  . COLONOSCOPY  2016  . CYST EXCISION     polynomial  . CYSTOSCOPY WITH INSERTION OF UROLIFT N/A 04/18/2018   Procedure: CYSTOSCOPY WITH INSERTION OF UROLIFT;  Surgeon: Cleon Gustin, MD;  Location: [facility];  Service: Urology;  Laterality: N/A;  . KNEE SURGERY Right 2000   arthroscopy  . RIGHT/LEFT HEART CATH AND CORONARY ANGIOGRAPHY N/A 01/30/2019   Procedure: RIGHT/LEFT HEART CATH AND CORONARY ANGIOGRAPHY;  Surgeon: [name], [name], MD;  Location: [facility];  Service: Cardiovascular;  Laterality: N/A;  . TONSILLECTOMY    . widson teeth extraction      Family History  Problem Relation Age of Onset  . Aneurysm Father   . Heart disease Father   . Varicose Veins Mother   . Arthritis Mother   . Macular degeneration Mother   . Cancer Neg Hx   . Colon cancer Neg Hx   . Esophageal cancer Neg Hx   . Rectal cancer Neg Hx   . Stomach cancer Neg Hx     Social History   Socioeconomic History  . Marital status: Single    Spouse name: Not on file  . Number of children: Not on file  . Years of education: Not on file  . Highest education level: Not on file  Occupational History  . Not on file  Social Needs  . Financial resource strain: Not on file  . Food insecurity    Worry: Not on file    Inability: Not on file  . Transportation needs    Medical: Not on file    Non-medical: Not on file  Tobacco Use  . Smoking status: Former Smoker    Packs/day: 1.50    Years: 5.00    Pack years: 7.50    Types: Cigarettes    Quit date: 06/15/1994    Years since quitting: 24.5  . Smokeless tobacco: Never Used  Substance and Sexual Activity  . Alcohol use: Yes    Alcohol/week: 14.0 standard drinks    Types: 14 Shots of liquor per week    Comment: 1 drink of liquor per day  . Drug use: Yes    Types: Marijuana    Comment: occasionally marijuana  . Sexual activity: Yes    Partners: Female    Birth control/protection: Condom    Comment: condom use most of the time but not always  Social History Narrative   [place] and [employer]. married '72- 3 years/divorced; married '89- 3 years/divorced;. married '03 - 3 years/divorced. 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    Social  History   Tobacco Use  Smoking Status Former Smoker  . Packs/day: 1.50  .  Years: 5.00  . Pack years: 7.50  . Types: Cigarettes  . Quit date: 06/15/1994  . Years since quitting: 25.3  Smokeless Tobacco Never Used    Social History   Substance and Sexual Activity  Alcohol Use Not Currently  . Alcohol/week: 14.0 standard drinks  . Types: 14 Shots of liquor per week   Comment: 1 drink of liquor per day     Allergies  Allergen Reactions  . Quinolones Other (See Comments)    Patient was warned about not using Cipro and similar antibiotics. Recent studies have raised concern that fluoroquinolone antibiotics could be associated with an increased risk of aortic aneurysm Fluoroquinolones have non-antimicrobial properties that might jeopardise the integrity of the extracellular matrix of the vascular wall In a  propensity score matched cohort study in [place], there was a 66% increased rate of aortic aneurysm or dissection associated with oral fluoroquinolone use, compared wit    Current Facility-Administered Medications  Medication Dose Route Frequency Provider Last Rate Last Admin  . cefUROXime (ZINACEF) 1.5 g in sodium chloride 0.9 % 100 mL IVPB  1.5 g Intravenous To OR [name], MD      . cefUROXime (ZINACEF) 750 mg in sodium chloride 0.9 % 100 mL IVPB  750 mg Intravenous To OR [name], MD      . chlorhexidine (HIBICLENS) 4 % liquid 2 application  30 mL Topical UD [name], MD      . dexmedetomidine (PRECEDEX) 400 MCG/100ML (4 mcg/mL) infusion  0.1-0.7 mcg/kg/hr Intravenous To OR [name], MD      . EPINEPHrine (ADRENALIN) 4 mg in NS 250 mL (0.016 mg/mL) premix infusion  0-10 mcg/min Intravenous To OR [name], MD      . heparin 30,000 units/NS 1000 mL solution for CELLSAVER   Other To OR [name], MD      . heparin sodium (porcine) 2,500 Units, papaverine 30 mg in electrolyte-148 (PLASMALYTE-148) 500 mL irrigation   Irrigation  To OR [name], MD      . insulin regular, human (MYXREDLIN) 100 units/ 100 mL infusion   Intravenous To OR [name], MD      . magnesium sulfate (IV Push/IM) injection 40 mEq  40 mEq Other To OR [name], MD      . metoprolol tartrate (LOPRESSOR) tablet 12.5 mg  12.5 mg Oral Once [name], MD      . milrinone (PRIMACOR) 20 MG/100 ML (0.2 mg/mL) infusion  0.3 mcg/kg/min Intravenous To OR [name], MD      . nitroGLYCERIN 50 mg in dextrose 5 % 250 mL (0.2 mg/mL) infusion  2-200 mcg/min Intravenous To OR [name], MD      . norepinephrine (LEVOPHED) 4mg  in 28mL premix infusion  0-40 mcg/min Intravenous To OR [name], MD      . phenylephrine (NEOSYNEPHRINE) 20-0.9 MG/250ML-% infusion  30-200 mcg/min Intravenous To OR [name], MD      . potassium chloride injection 80 mEq  80 mEq Other To OR [name], MD      . tranexamic acid (CYKLOKAPRON) 2,500 mg in sodium chloride 0.9 % 250 mL (10 mg/mL) infusion  1.5 mg/kg/hr Intravenous To OR [name], MD      . tranexamic acid (CYKLOKAPRON) bolus via infusion - over 30 minutes 2,020.5 mg  15 mg/kg Intravenous To OR [name], MD      .  tranexamic acid (CYKLOKAPRON) pump prime solution 269 mg  2 mg/kg Intracatheter To OR [name], MD      . vancomycin (VANCOREADY) IVPB 1500 mg/300 mL  1,500 mg Intravenous To OR [name], MD       Facility-Administered Medications Ordered in Other Encounters  Medication Dose Route Frequency Provider Last Rate Last Admin  . fentaNYL citrate (PF) (SUBLIMAZE) injection   Intravenous Anesthesia Intra-op Barrington Ellison, CRNA   50 mcg at 10/16/19 [id number]  . lactated ringers infusion   Intravenous Continuous PRN Barrington Ellison, CRNA   New Bag at 10/16/19 0630  . midazolam (VERSED) 5 MG/5ML injection   Intravenous Anesthesia Intra-op Barrington Ellison, CRNA   1 mg at 10/16/19 [id number]    Pertinent items are noted  in HPI.   Review of Systems:  Review of Systems  Constitutional: Positive for malaise/fatigue.  HENT: Negative.   Eyes: Negative.   Respiratory: Positive for shortness of breath. Negative for cough, hemoptysis, sputum production and wheezing.   Cardiovascular: Positive for palpitations, orthopnea and leg swelling. Negative for chest pain, claudication and PND.  Gastrointestinal: Negative.   Genitourinary: Negative.   Musculoskeletal: Positive for myalgias. Negative for back pain, falls, joint pain and neck pain.  Skin: Negative.   Neurological: Negative.   Endo/Heme/Allergies: Negative for environmental allergies and polydipsia. Bruises/bleeds easily.  Psychiatric/Behavioral: Negative.        Physical Exam: BP (!) 126/56   Pulse 67   Temp 98.2 F (36.8 C)   Resp 20   Ht 6\' 5"  (1.956 m)   Wt 134.7 kg   SpO2 100%   BMI 35.21 kg/m   General appearance: alert, cooperative and no distress Head: Normocephalic, without obvious abnormality, atraumatic Neck: no adenopathy, no carotid bruit, no JVD, supple, symmetrical, trachea midline and thyroid not enlarged, symmetric, no tenderness/mass/nodules Lymph nodes: Cervical, supraclavicular, and axillary nodes normal. Resp: clear to auscultation bilaterally Cardio: regular rate and rhythm and diastolic murmur: mid diastolic 2/6, decrescendo at 2nd left intercostal space GI: soft, non-tender; bowel sounds normal; no masses,  no organomegaly Extremities: extremities normal, atraumatic, no cyanosis or edema and Homans sign is negative, no sign of DVT Neurologic: Grossly normal      Recent Radiology Findings:  CT ANGIO CHEST AORTA W/CM & OR WO/CM  Result Date: 09/14/2019 CLINICAL DATA:  Follow-up thoracic aortic aneurysm EXAM: CT ANGIOGRAPHY CHEST WITH CONTRAST TECHNIQUE: Multidetector CT imaging of the chest was performed using the standard protocol during bolus administration of intravenous contrast. Multiplanar CT image reconstructions  and MIPs were obtained to evaluate the vascular anatomy. CONTRAST:  107mL ISOVUE-370 IOPAMIDOL (ISOVUE-370) INJECTION 76% COMPARISON:  Chest CTA 12/15/2018, included portion from coronary CTA 04/03/2019 FINDINGS: Cardiovascular: Fusiform aneurysmal dilatation of the proximal ascending aorta measuring 5.3 cm greatest dimension. This measured 5.2 cm on October 2020 exam, and 5 cm on July 2020 exam. Distal aspect of the ascending aorta measures 4.4 cm (series 10, image 82. Normal caliber of the transverse aorta. Slight increase enlargement of the distal arch and descending aorta, 4.3 cm, series 5, image 47, previously 4.1 cm. Proximal descending aorta measures 4.5 cm, series 12, image 146. Distal descending aorta at the level of the left atrium measures 3.8 cm. The aorta at the diaphragmatic hiatus measures 3.7 cm. No dissection or perivascular stranding. Moderate calcified and noncalcified atheromatous plaque. Conventional branching pattern from the aortic arch. Brachiocephalic artery is tortuous. Heart is normal in size. There are coronary artery calcifications. Minimal pericardial  effusion measuring up to 9 mm in depth adjacent to the right ventricle. Mediastinum/Nodes: No enlarged mediastinal or hilar lymph nodes. Multiple small mediastinal lymph nodes are similar to prior and likely reactive. No visualized thyroid nodule. No definite esophageal wall thickening. Lungs/Pleura: Minor linear/compressive atelectasis in the left lower lobe. Unchanged nonspecific ground-glass opacity in the superior most segment of the right lower lobe. Stable perifissural nodule in the left lower lobe series 7, image 70. Stable pleural-based 6 mm nodule in the right lower lobe series 7, image 85. No new pulmonary nodule. No confluent airspace disease. Trachea and mainstem bronchi are patent. No pleural fluid. No evidence of pulmonary edema. Upper Abdomen: No acute findings. Atherosclerosis of upper abdominal aorta. Musculoskeletal: There  are no acute or suspicious osseous abnormalities. 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 Review of the MIP images confirms the above findings. IMPRESSION: 1. Fusiform ascending thoracic aortic aneurysm measures 5.3 cm greatest dimension, previously 5.2 cm on October 2020 exam. Slight increase enlargement of the distal arch and descending aorta, 4.3 cm, previously 4.1 cm. No dissection or acute aortic abnormality. Aortic aneurysm NOS (ICD10-I71.9) 2. Stable pulmonary nodules. 3. Coronary artery calcifications. Aortic Atherosclerosis (ICD10-I70.0). Electronically Signed   By: Keith Rake M.D.   On: 09/14/2019 12:31     ECHOCARDIOGRAM COMPLETE  Result Date: 07/29/2019    ECHOCARDIOGRAM REPORT   Patient Name:   ADAMO MERRITHEW Date of Exam: 07/28/2019 Medical Rec #:  [id number]       Height:       77.0 in Accession #:    [id number]      Weight:       296.0 lb Date of Birth:  06/22/1945      BSA:          2.64 m Patient Age:    74 years        BP:           134/78 mmHg Patient Gender: M               HR:           79 bpm. Exam Location:  [place] Procedure: 2D Echo                                MODIFIED REPORT: This report was modified by Cherlynn Kaiser MD on 07/29/2019 due to corrections.  Indications:     Atrial Fibrillation 427.31 / I48.91  History:         Patient has prior history of Echocardiogram examinations, most                  recent 12/28/2018. CHF, Arrythmias:Atrial Fibrillation; Risk                  Factors:Hypertension, Diabetes and Dyslipidemia. Thoracic                  aortic aneurysm.  Sonographer:     Clayton Lefort RDCS (AE) Referring Phys:  [id number] [name] [name] Diagnosing Phys: Cherlynn Kaiser MD  Sonographer Comments: Image acquisition challenging due to respiratory motion. IMPRESSIONS  1. Left ventricular ejection fraction, by estimation, is 60%. The left ventricle has normal function. The left ventricle has no regional wall motion  abnormalities. The left ventricular internal cavity size was mildly dilated. There is mildly increased left ventricular hypertrophy. Left ventricular diastolic parameters are indeterminate.  2. Right ventricular systolic function is normal. The right ventricular size is normal. Tricuspid regurgitation signal is inadequate for assessing PA pressure.  3. The mitral valve is normal in structure and function. No evidence of mitral valve regurgitation. No evidence of mitral stenosis.  4. The aortic valve is grossly normal. Aortic valve regurgitation is moderate. No aortic stenosis is present.  5. Aortic dilatation noted. There is moderate to severe dilatation of the ascending aorta measuring 53 mm.  6. The inferior vena cava is normal in size with greater than 50% respiratory variability, suggesting right atrial pressure of 3 mmHg. Comparison(s): A prior study was performed on 12/28/2018. Prior images reviewed side by side. Side by side measurements performed. When measured with a similar technique, LV cavity measures 59-61 mm in diastole and 47 mm in systole. LV function is preserved. Ascending aorta measurement has not significantly changed when measured in a similar location, and in comparison to CT PV study 04/03/2019 may not have significantly changed. FINDINGS  Left Ventricle: Left ventricular ejection fraction, by estimation, is 60%. The left ventricle has normal function. The left ventricle has no regional wall motion abnormalities. The left ventricular internal cavity size was mildly dilated. There is mildly increased left ventricular hypertrophy. Left ventricular diastolic parameters are indeterminate. Right Ventricle: The right ventricular size is normal. No increase in right ventricular wall thickness. Right ventricular systolic function is normal. Tricuspid regurgitation signal is inadequate for assessing PA pressure. Left Atrium: Left atrial size was normal in size. Right Atrium: Right atrial size was normal  in size. Pericardium: There is no evidence of pericardial effusion. Presence of large anterior epicardial fat pad. Mitral Valve: The mitral valve is normal in structure and function. Normal mobility of the mitral valve leaflets. No evidence of mitral valve regurgitation. No evidence of mitral valve stenosis. Tricuspid Valve: The tricuspid valve is normal in structure. Tricuspid valve regurgitation is trivial. No evidence of tricuspid stenosis. Aortic Valve: The aortic valve is grossly normal. Aortic valve regurgitation is moderate. Aortic regurgitation PHT measures 622 msec. No aortic stenosis is present. Aortic valve mean gradient measures 4.0 mmHg. Aortic valve peak gradient measures 6.7 mmHg. Aortic valve area, by VTI measures 2.80 cm. Pulmonic Valve: The pulmonic valve was normal in structure. Pulmonic valve regurgitation is trivial. No evidence of pulmonic stenosis. Aorta: Aortic dilatation noted. There is moderate to severe dilatation of the ascending aorta measuring 53 mm. Venous: The inferior vena cava is normal in size with greater than 50% respiratory variability, suggesting right atrial pressure of 3 mmHg. IAS/Shunts: No atrial level shunt detected by color flow Doppler.  LEFT VENTRICLE PLAX 2D LVIDd:         6.10 cm LVIDs:         4.70 cm LV PW:         1.34 cm LV IVS:        1.25 cm LVOT diam:     2.10 cm LV SV:         90.40 ml LV SV Index:   30.87 LVOT Area:     3.46 cm  RIGHT VENTRICLE            IVC RV Basal diam:  2.76 cm    IVC diam: 1.74 cm RV S prime:     9.53 cm/s TAPSE (M-mode): 2.7 cm LEFT ATRIUM           Index       RIGHT ATRIUM  Index LA diam:      3.10 cm 1.17 cm/m  RA Area:     13.00 cm LA Vol (A2C): 68.0 ml 25.73 ml/m RA Volume:   27.20 ml  10.29 ml/m LA Vol (A4C): 63.6 ml 24.07 ml/m  AORTIC VALVE AV Area (Vmax):    3.01 cm AV Area (Vmean):   2.88 cm AV Area (VTI):     2.80 cm AV Vmax:           129.00 cm/s AV Vmean:          89.300 cm/s AV VTI:            0.323 m AV  Peak Grad:      6.7 mmHg AV Mean Grad:      4.0 mmHg LVOT Vmax:         112.00 cm/s LVOT Vmean:        74.300 cm/s LVOT VTI:          0.261 m LVOT/AV VTI ratio: 0.81 AI PHT:            622 msec  AORTA Ao Root diam: 3.50 cm Ao Asc diam:  5.30 cm  SHUNTS Systemic VTI:  0.26 m Systemic Diam: 2.10 cm Cherlynn Kaiser MD Electronically signed by Cherlynn Kaiser MD Signature Date/Time: 07/29/2019/10:41:43 AM    Final (Updated)     Ct Angio Chest Aorta W &/or Wo Contrast  Result Date: 12/15/2018 CLINICAL DATA:  Follow-up TAA EXAM: CT ANGIOGRAPHY CHEST WITH CONTRAST TECHNIQUE: Multidetector CT imaging of the chest was performed using the standard protocol during bolus administration of intravenous contrast. Multiplanar CT image reconstructions and MIPs were obtained to evaluate the vascular anatomy. CONTRAST:  60mL ISOVUE-370 IOPAMIDOL (ISOVUE-370) INJECTION 76% COMPARISON:  11/04/2017, 02/26/2017 FINDINGS: Cardiovascular: Preferential opacification of the thoracic aorta. Unchanged enlargement of the tubular thoracic aorta, measuring 5.0 x 4.8 cm. The aortic root and sinuses of Valsalva are normal in caliber. Unchanged enlargement of the distal arch and descending thoracic aorta measuring 4.1 x 4.1 cm in the midportion of the descending aorta. Moderate mixed calcific atherosclerosis. Normal heart size. Left coronary artery calcifications. No pericardial effusion. Mediastinum/Nodes: No enlarged mediastinal, hilar, or axillary lymph nodes. Thyroid gland, trachea, and esophagus demonstrate no significant findings. Lungs/Pleura: Scattered nonspecific ground-glass opacity of the superior segment right lower lobe. Stable, small benign pulmonary nodules, for example a 3 mm fissural nodule of the superior segment left lower lobe (series 7, image 72). No pleural effusion or pneumothorax. Upper Abdomen: No acute abnormality. 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 No acute or significant osseous  findings. Review of the MIP images confirms the above findings. IMPRESSION: 1. Unchanged enlargement of the tubular thoracic aorta, measuring 5.0 x 4.8 cm. The aortic root and sinuses of Valsalva are normal in caliber. Unchanged enlargement of the distal arch and descending thoracic aorta measuring 4.1 x 4.1 cm in the midportion of the descending aorta. These findings are not significantly changed when compared to examinations dating back to 02/26/2017 and measured similarly. Moderate mixed calcific atherosclerosis. 2.  Coronary artery disease. 3. Scattered nonspecific ground-glass opacity of the superior segment right lower lobe, likely infectious or inflammatory. 4. Stable, small benign pulmonary nodules, for example a 3 mm fissural nodule of the superior segment left lower lobe (series 7, image 72). Electronically Signed   By: Eddie Candle M.D.   On: 12/15/2018 11:04      Ct Angio Chest Aorta W/cm &/or Wo/cm  Result Date: 11/04/2017  CLINICAL DATA:  Thoracic aortic aneurysm EXAM: CT ANGIOGRAPHY CHEST WITH CONTRAST TECHNIQUE: Multidetector CT imaging of the chest was performed using the standard protocol during bolus administration of intravenous contrast. Multiplanar CT image reconstructions and MIPs were obtained to evaluate the vascular anatomy. CONTRAST:  32mL ISOVUE-370 IOPAMIDOL (ISOVUE-370) INJECTION 76% COMPARISON:  02/26/2017 FINDINGS: Cardiovascular: Maximal diameters of the ascending aorta at the sinus of Valsalva, sino-tubular junction, and ascending aorta are 3.9 cm, 3.8 cm, and 5.2 cm respectively. Based on my direct measurements on the prior study, this is not significantly changed. There is no evidence of aortic dissection. There is no obvious intramural hematoma. Maximal diameter of the proximal descending thoracic aorta is 4.3 cm. The aortic arch is nonaneurysmal. Atherosclerotic calcifications in the aortic arch. Minimal atherosclerotic calcifications in the descending thoracic aorta. Great  vessels are patent. Right subclavian artery is ectatic with a maximal diameter of 2.0 cm. Mild LAD territory coronary artery calcifications. No obvious acute pulmonary thromboembolism. Mediastinum/Nodes: No abnormal adenopathy. No pericardial effusion. Visualized thyroid is atrophic. Mild diffuse wall thickening of the esophagus. Lungs/Pleura: No pneumothorax. No pleural effusion. Pleural base nodule in the lateral right lower lobe on image 92 of series 6 measures 7 mm. This is stable. Upper Abdomen: No acute abnormality. Musculoskeletal: No vertebral compression deformity. Review of the MIP images confirms the above findings. IMPRESSION: Aneurysmal dilatation of the ascending aorta is 5.2 cm. Based on my direct measurements of the prior study, this has not significantly changed. There is no evidence of dissection. Mild aneurysmal dilatation of the descending thoracic aorta at 4.3 cm is also not significantly changed. Ascending thoracic aortic aneurysm. Recommend semi-annual imaging followup by CTA or MRA and referral to cardiothoracic surgery if not already obtained. This recommendation follows 2010 ACCF/AHA/AATS/ACR/ASA/SCA/SCAI/SIR/STS/SVM Guidelines for the Diagnosis and Management of Patients With Thoracic Aortic Disease. Circulation. 2010; 121: [id number] Aortic Atherosclerosis (ICD10-I70.0). Electronically Signed   By: Marybelle Killings M.D.   On: 11/04/2017 09:05   CLINICAL DATA:  Aortic root dilatation.  EXAM: CT ANGIOGRAPHY CHEST WITH CONTRAST  TECHNIQUE: Multidetector CT imaging of the chest was performed using the standard protocol during bolus administration of intravenous contrast. Multiplanar CT image reconstructions and MIPs were obtained to evaluate the vascular anatomy.  CONTRAST:  100 mL of Isovue 370 intravenously.  COMPARISON:  None.  FINDINGS: Cardiovascular: Aortic root measures 3.9 cm in diameter. 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 Transverse aortic  arch measures 3.1 cm. Aneurysmal dilatation of descending thoracic aorta is noted with maximum measured diameter 4.1 cm. Aortic atherosclerosis is noted without dissection. Great vessels are widely patent without stenosis. No pericardial effusion is noted.  Mediastinum/Nodes: No enlarged mediastinal, hilar, or axillary lymph nodes. Thyroid gland, trachea, and esophagus demonstrate no significant findings.  Lungs/Pleura: Lungs are clear. No pleural effusion or pneumothorax.  Upper Abdomen: Probable fatty infiltration of the liver is noted.  Musculoskeletal: 8.6 x 5.3 cm fat containing lesion is seen within the right pectoralis muscle most consistent with intramuscular lipoma.  Review of the MIP images confirms the above findings.  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 Recommend semi-annual imaging followup by CTA or MRA and referral to cardiothoracic surgery if not already obtained. This recommendation follows 2010 ACCF/AHA/AATS/ACR/ASA/SCA/SCAI/SIR/STS/SVM Guidelines for the Diagnosis and Management of Patients With Thoracic Aortic Disease. Circulation. 2010; [id number].  Probable fatty infiltration of the liver.  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  Aortic Atherosclerosis (ICD10-I70.0).   Electronically Signed  By: Marijo Conception, M.D.   On: 02/26/2017 11:57  I have independently reviewed the above radiologic studies.  Recent Lab Findings: Lab Results  Component Value Date   WBC 8.4 10/12/2019   HGB 14.4 10/12/2019   HCT 44.3 10/12/2019   PLT 196 10/12/2019   GLUCOSE 139 (H) 10/12/2019   CHOL 148 05/29/2019   TRIG 255.0 (H) 05/29/2019   HDL 34.00 (L) 05/29/2019   LDLDIRECT 78.0 05/29/2019   LDLCALC 48 06/01/2018   ALT 26 10/12/2019   AST 20 10/12/2019   NA 138 10/12/2019   K 4.6 10/12/2019   CL 106 10/12/2019   CREATININE 1.19 10/12/2019   BUN 23 10/12/2019   CO2 21 (L)  10/12/2019   TSH 4.29 09/06/2019   INR 1.0 10/12/2019   HGBA1C 6.3 (H) 10/12/2019    Study Highlights    The left ventricular ejection fraction is moderately decreased (30-44%).  Nuclear stress EF: 39%.  There was no ST segment deviation noted during stress.  No T wave inversion was noted during stress.  This is an intermediate risk study due to reduced systolic function.  There is no ischemia.    Nuclear History and Indications   History and Indications Indication for Stress Test: Diagnosis of coronary disease History: Hx Acute Bronchitis; No prior cardiac history reported; No prior NUC MPI for comparison. Cardiac Risk Factors: Family History - CAD, History of Smoking, Hypertension, Lipids and Obesity  Symptoms: Dizziness, DOE, Fatigue and SOB  Stress Findings   ECG Baseline ECG exhibits normal sinus rhythm..  Stress Findings A pharmacological stress test was performed using IV Lexiscan 0.4mg  over 10 seconds performed without concurrent submaximal exercise.  The patient reported shortness of breath during the stress test.   Test was stopped per protocol.  Response to Stress There was no ST segment deviation noted during stress.  No T wave inversion was noted during stress. Arrhythmias during stress: frequent PVCs.  Arrhythmias during recovery: none.  Arrhythmias were not significant.  ECG was interpretable and there was no significant change from baseline.  Stress Measurements   Baseline Vitals  Rest HR 63 bpm    Rest BP 141/67 mmHg    Peak Stress Vitals  Peak HR 74 bpm    Peak BP 136/69 mmHg       Nuclear Stress Measurements   LV sys vol 143 mL    TID 0.98     LV dias vol 234 mL    SSS 0     SRS 0     SDS 0          Nuclear Stress Findings   Isotope administration Rest isotope was administered with an IV injection of 31.3 mCi technetium tetrofosmin. Rest SPECT images were obtained approximately 45 minutes post tracer injection. Stress isotope was  administered with an IV injection of 29.6 mCi technetium tetrofosmin 20 seconds post IV Lexiscan administration. Stress SPECT images were obtained approximately 60 minutes post tracer injection.  Nuclear Study Quality Overall image quality is good.  Nuclear Measurements Study was gated.  Rest Perfusion Rest perfusion normal.  Stress Perfusion Stress perfusion normal.  Overall Study Impression Myocardial perfusion is normal. This is an intermediate risk study. Overall left ventricular systolic function was abnormal. LV cavity size is moderately enlarged. Nuclear stress EF: 39%. The left ventricular ejection fraction is moderately decreased (30-44%). There is no prior study for comparison.  From: ACCF/SCAI/STS/AATS/AHA/ASNC/HFSA/SCCT 2012 Appropriate Use Criteria for Coronary Revascularization Focused Update  Wall Scoring  Score Index: 2.000 Percent Normal: 0.0%          The left ventricular wall motion is globally hypokinetic.          Signed   Electronically signed by Skeet Latch, MD on 02/16/17 at [facility]T  Report approved and finalized on 02/16/2017 1918   Cardiac Cath:  LV end diastolic pressure is normal.  There is moderate (3+) aortic regurgitation.   1. Normal coronary anatomy. Left dominant circulation 2. Normal right heart pressures 3. Normal cardiac output.  4. Moderate aortic insufficiency. No significant stenosis 5. Ascending thoracic aortic aneurysm.   Plan: will review with Dr Servando Snare. If Aortic surgery is not planned in the near future would recommend referral to EP for consideration of ablation of Afib.    Aortic Size Index=  5.3  /Body surface area is 2.71 meters squared. 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  We discussed the pros and cons of surgical intervention including the expected postoperative course time to recover .  As noted he does supply a significant amount of care to his fiance .  He returned today to discuss timing of surgery after he had discussed with his family support he and his fianc will need.  He is also now concerned about the need for prostate biopsy.  Prior to surgery the patient will need to stop his Synjardy for at least 3 days prior to surgery both because of the Metformin and also the risk of euglycemic ketoacidosis in the perioperative.Marland Kitchen He will also need to be off Xarelto prior to surgery  The goals risks and alternatives of the planned surgical procedure Procedure(s): AORTIC VALVE REPLACEMENT (AVR) (N/A) REPLACEMENT ASCENDING AORTA (N/A) MAZE (N/A) TRANSESOPHAGEAL ECHOCARDIOGRAM (TEE) (N/A)  have been discussed with the patient in detail. The risks of the procedure including death, infection, stroke, myocardial infarction, bleeding, blood transfusion have all been discussed specifically.  I have quoted [name] a 3% of perioperative mortality and a complication rate as high as 40%. The patient's questions have been answered.LUQMAN VANDEGRIFF is willing  to proceed with the planned procedure.  [name] MD      [address] [place], [address] Office [phone]   Beeper [phone]  10/16/2019 7:16  AM

## 2019-10-16 NOTE — Anesthesia Procedure Notes (Signed)
Central Venous Catheter Insertion Performed by: Nolon Nations, MD, anesthesiologist Patient location: Pre-op. Preanesthetic checklist: patient identified, IV checked, site marked, risks and benefits discussed, surgical consent, monitors and equipment checked, pre-op evaluation, timeout performed and anesthesia consent Position: Trendelenburg Hand hygiene performed  and maximum sterile barriers used  PA cath was placed.Swan type:thermodilution PA Cath depth:50 Procedure performed without using ultrasound guided technique. Attempts: 1 Patient tolerated the procedure well with no immediate complications.

## 2019-10-17 ENCOUNTER — Inpatient Hospital Stay (HOSPITAL_COMMUNITY): Payer: Medicare HMO

## 2019-10-17 ENCOUNTER — Encounter: Payer: Self-pay | Admitting: *Deleted

## 2019-10-17 LAB — POCT I-STAT 7, (LYTES, BLD GAS, ICA,H+H)
Acid-Base Excess: 1 mmol/L (ref 0.0–2.0)
Acid-base deficit: 1 mmol/L (ref 0.0–2.0)
Acid-base deficit: 1 mmol/L (ref 0.0–2.0)
Acid-base deficit: 2 mmol/L (ref 0.0–2.0)
Acid-base deficit: 3 mmol/L — ABNORMAL HIGH (ref 0.0–2.0)
Bicarbonate: 26.1 mmol/L (ref 20.0–28.0)
Bicarbonate: 24.3 mmol/L (ref 20.0–28.0)
Bicarbonate: 25.2 mmol/L (ref 20.0–28.0)
Bicarbonate: 24 mmol/L (ref 20.0–28.0)
Bicarbonate: 23.1 mmol/L (ref 20.0–28.0)
Calcium, Ion: 1.32 mmol/L (ref 1.15–1.40)
Calcium, Ion: 1.24 mmol/L (ref 1.15–1.40)
Calcium, Ion: 1.08 mmol/L — ABNORMAL LOW (ref 1.15–1.40)
Calcium, Ion: 1.15 mmol/L (ref 1.15–1.40)
Calcium, Ion: 1.13 mmol/L — ABNORMAL LOW (ref 1.15–1.40)
HCT: 39 % (ref 39.0–52.0)
HCT: 37 % — ABNORMAL LOW (ref 39.0–52.0)
HCT: 31 % — ABNORMAL LOW (ref 39.0–52.0)
HCT: 34 % — ABNORMAL LOW (ref 39.0–52.0)
HCT: 33 % — ABNORMAL LOW (ref 39.0–52.0)
Hemoglobin: 13.3 g/dL (ref 13.0–17.0)
Hemoglobin: 12.6 g/dL — ABNORMAL LOW (ref 13.0–17.0)
Hemoglobin: 10.5 g/dL — ABNORMAL LOW (ref 13.0–17.0)
Hemoglobin: 11.6 g/dL — ABNORMAL LOW (ref 13.0–17.0)
Hemoglobin: 11.2 g/dL — ABNORMAL LOW (ref 13.0–17.0)
O2 Saturation: 97 %
O2 Saturation: 100 %
O2 Saturation: 100 %
O2 Saturation: 100 %
O2 Saturation: 100 %
Potassium: 4.6 mmol/L (ref 3.5–5.1)
Potassium: 4.7 mmol/L (ref 3.5–5.1)
Potassium: 4.5 mmol/L (ref 3.5–5.1)
Potassium: 4.9 mmol/L (ref 3.5–5.1)
Potassium: 4.5 mmol/L (ref 3.5–5.1)
Sodium: 141 mmol/L (ref 135–145)
Sodium: 140 mmol/L (ref 135–145)
Sodium: 141 mmol/L (ref 135–145)
Sodium: 139 mmol/L (ref 135–145)
Sodium: 141 mmol/L (ref 135–145)
TCO2: 28 mmol/L (ref 22–32)
TCO2: 26 mmol/L (ref 22–32)
TCO2: 26 mmol/L (ref 22–32)
TCO2: 25 mmol/L (ref 22–32)
TCO2: 24 mmol/L (ref 22–32)
pCO2 arterial: 54.6 mmHg — ABNORMAL HIGH (ref 32.0–48.0)
pCO2 arterial: 41 mmHg (ref 32.0–48.0)
pCO2 arterial: 39.4 mmHg (ref 32.0–48.0)
pCO2 arterial: 46.9 mmHg (ref 32.0–48.0)
pCO2 arterial: 45.3 mmHg (ref 32.0–48.0)
pH, Arterial: 7.287 — ABNORMAL LOW (ref 7.350–7.450)
pH, Arterial: 7.381 (ref 7.350–7.450)
pH, Arterial: 7.414 (ref 7.350–7.450)
pH, Arterial: 7.317 — ABNORMAL LOW (ref 7.350–7.450)
pH, Arterial: 7.316 — ABNORMAL LOW (ref 7.350–7.450)
pO2, Arterial: 103 mmHg (ref 83.0–108.0)
pO2, Arterial: 247 mmHg — ABNORMAL HIGH (ref 83.0–108.0)
pO2, Arterial: 262 mmHg — ABNORMAL HIGH (ref 83.0–108.0)
pO2, Arterial: 249 mmHg — ABNORMAL HIGH (ref 83.0–108.0)
pO2, Arterial: 206 mmHg — ABNORMAL HIGH (ref 83.0–108.0)

## 2019-10-17 LAB — CBC
HCT: 36.7 % — ABNORMAL LOW (ref 39.0–52.0)
HCT: 36.8 % — ABNORMAL LOW (ref 39.0–52.0)
HCT: 39.5 % (ref 39.0–52.0)
Hemoglobin: 11.9 g/dL — ABNORMAL LOW (ref 13.0–17.0)
Hemoglobin: 11.9 g/dL — ABNORMAL LOW (ref 13.0–17.0)
Hemoglobin: 12.6 g/dL — ABNORMAL LOW (ref 13.0–17.0)
MCH: 30.1 pg (ref 26.0–34.0)
MCH: 30 pg (ref 26.0–34.0)
MCH: 30.1 pg (ref 26.0–34.0)
MCHC: 32.4 g/dL (ref 30.0–36.0)
MCHC: 32.3 g/dL (ref 30.0–36.0)
MCHC: 31.9 g/dL (ref 30.0–36.0)
MCV: 92.9 fL (ref 80.0–100.0)
MCV: 92.7 fL (ref 80.0–100.0)
MCV: 94.5 fL (ref 80.0–100.0)
Platelets: 120 10*3/uL — ABNORMAL LOW (ref 150–400)
Platelets: 121 10*3/uL — ABNORMAL LOW (ref 150–400)
Platelets: UNDETERMINED 10*3/uL (ref 150–400)
RBC: 3.95 MIL/uL — ABNORMAL LOW (ref 4.22–5.81)
RBC: 3.97 MIL/uL — ABNORMAL LOW (ref 4.22–5.81)
RBC: 4.18 MIL/uL — ABNORMAL LOW (ref 4.22–5.81)
RDW: 13.2 % (ref 11.5–15.5)
RDW: 13.4 % (ref 11.5–15.5)
RDW: 13.7 % (ref 11.5–15.5)
WBC: 11.7 10*3/uL — ABNORMAL HIGH (ref 4.0–10.5)
WBC: 11.9 10*3/uL — ABNORMAL HIGH (ref 4.0–10.5)
WBC: 14.4 10*3/uL — ABNORMAL HIGH (ref 4.0–10.5)
nRBC: 0 % (ref 0.0–0.2)
nRBC: 0 % (ref 0.0–0.2)
nRBC: 0 % (ref 0.0–0.2)

## 2019-10-17 LAB — BASIC METABOLIC PANEL
Anion gap: 7 (ref 5–15)
Anion gap: 11 (ref 5–15)
BUN: 14 mg/dL (ref 8–23)
BUN: 15 mg/dL (ref 8–23)
CO2: 22 mmol/L (ref 22–32)
CO2: 21 mmol/L — ABNORMAL LOW (ref 22–32)
Calcium: 8.5 mg/dL — ABNORMAL LOW (ref 8.9–10.3)
Calcium: 8.6 mg/dL — ABNORMAL LOW (ref 8.9–10.3)
Chloride: 110 mmol/L (ref 98–111)
Chloride: 103 mmol/L (ref 98–111)
Creatinine, Ser: 0.89 mg/dL (ref 0.61–1.24)
Creatinine, Ser: 1.19 mg/dL (ref 0.61–1.24)
GFR calc Af Amer: >60 mL/min (ref >60)
GFR calc Af Amer: >60 mL/min (ref >60)
GFR calc non Af Amer: >60 mL/min (ref >60)
GFR calc non Af Amer: >60 mL/min (ref >60)
Glucose, Bld: 141 mg/dL — ABNORMAL HIGH (ref 70–99)
Glucose, Bld: 185 mg/dL — ABNORMAL HIGH (ref 70–99)
Potassium: 4.7 mmol/L (ref 3.5–5.1)
Potassium: 4.3 mmol/L (ref 3.5–5.1)
Sodium: 139 mmol/L (ref 135–145)
Sodium: 135 mmol/L (ref 135–145)

## 2019-10-17 LAB — POCT I-STAT, CHEM 8
BUN: 25 mg/dL — ABNORMAL HIGH (ref 8–23)
BUN: 24 mg/dL — ABNORMAL HIGH (ref 8–23)
BUN: 23 mg/dL (ref 8–23)
BUN: 21 mg/dL (ref 8–23)
BUN: 20 mg/dL (ref 8–23)
BUN: 20 mg/dL (ref 8–23)
BUN: 21 mg/dL (ref 8–23)
Calcium, Ion: 1.3 mmol/L (ref 1.15–1.40)
Calcium, Ion: 1.27 mmol/L (ref 1.15–1.40)
Calcium, Ion: 1.2 mmol/L (ref 1.15–1.40)
Calcium, Ion: 1.07 mmol/L — ABNORMAL LOW (ref 1.15–1.40)
Calcium, Ion: 1.17 mmol/L (ref 1.15–1.40)
Calcium, Ion: 1.11 mmol/L — ABNORMAL LOW (ref 1.15–1.40)
Calcium, Ion: 1.11 mmol/L — ABNORMAL LOW (ref 1.15–1.40)
Chloride: 104 mmol/L (ref 98–111)
Chloride: 105 mmol/L (ref 98–111)
Chloride: 105 mmol/L (ref 98–111)
Chloride: 103 mmol/L (ref 98–111)
Chloride: 105 mmol/L (ref 98–111)
Chloride: 105 mmol/L (ref 98–111)
Chloride: 105 mmol/L (ref 98–111)
Creatinine, Ser: 1 mg/dL (ref 0.61–1.24)
Creatinine, Ser: 0.9 mg/dL (ref 0.61–1.24)
Creatinine, Ser: 0.9 mg/dL (ref 0.61–1.24)
Creatinine, Ser: 0.9 mg/dL (ref 0.61–1.24)
Creatinine, Ser: 0.8 mg/dL (ref 0.61–1.24)
Creatinine, Ser: 0.9 mg/dL (ref 0.61–1.24)
Creatinine, Ser: 0.8 mg/dL (ref 0.61–1.24)
Glucose, Bld: 139 mg/dL — ABNORMAL HIGH (ref 70–99)
Glucose, Bld: 140 mg/dL — ABNORMAL HIGH (ref 70–99)
Glucose, Bld: 123 mg/dL — ABNORMAL HIGH (ref 70–99)
Glucose, Bld: 129 mg/dL — ABNORMAL HIGH (ref 70–99)
Glucose, Bld: 148 mg/dL — ABNORMAL HIGH (ref 70–99)
Glucose, Bld: 153 mg/dL — ABNORMAL HIGH (ref 70–99)
Glucose, Bld: 148 mg/dL — ABNORMAL HIGH (ref 70–99)
HCT: 37 % — ABNORMAL LOW (ref 39.0–52.0)
HCT: 37 % — ABNORMAL LOW (ref 39.0–52.0)
HCT: 34 % — ABNORMAL LOW (ref 39.0–52.0)
HCT: 31 % — ABNORMAL LOW (ref 39.0–52.0)
HCT: 33 % — ABNORMAL LOW (ref 39.0–52.0)
HCT: 33 % — ABNORMAL LOW (ref 39.0–52.0)
HCT: 33 % — ABNORMAL LOW (ref 39.0–52.0)
Hemoglobin: 12.6 g/dL — ABNORMAL LOW (ref 13.0–17.0)
Hemoglobin: 12.6 g/dL — ABNORMAL LOW (ref 13.0–17.0)
Hemoglobin: 11.6 g/dL — ABNORMAL LOW (ref 13.0–17.0)
Hemoglobin: 10.5 g/dL — ABNORMAL LOW (ref 13.0–17.0)
Hemoglobin: 11.2 g/dL — ABNORMAL LOW (ref 13.0–17.0)
Hemoglobin: 11.2 g/dL — ABNORMAL LOW (ref 13.0–17.0)
Hemoglobin: 11.2 g/dL — ABNORMAL LOW (ref 13.0–17.0)
Potassium: 4.6 mmol/L (ref 3.5–5.1)
Potassium: 4.6 mmol/L (ref 3.5–5.1)
Potassium: 4.9 mmol/L (ref 3.5–5.1)
Potassium: 4.3 mmol/L (ref 3.5–5.1)
Potassium: 3.9 mmol/L (ref 3.5–5.1)
Potassium: 4.9 mmol/L (ref 3.5–5.1)
Potassium: 4.6 mmol/L (ref 3.5–5.1)
Sodium: 140 mmol/L (ref 135–145)
Sodium: 139 mmol/L (ref 135–145)
Sodium: 139 mmol/L (ref 135–145)
Sodium: 139 mmol/L (ref 135–145)
Sodium: 139 mmol/L (ref 135–145)
Sodium: 139 mmol/L (ref 135–145)
Sodium: 142 mmol/L (ref 135–145)
TCO2: 26 mmol/L (ref 22–32)
TCO2: 24 mmol/L (ref 22–32)
TCO2: 26 mmol/L (ref 22–32)
TCO2: 29 mmol/L (ref 22–32)
TCO2: 27 mmol/L (ref 22–32)
TCO2: 24 mmol/L (ref 22–32)
TCO2: 24 mmol/L (ref 22–32)

## 2019-10-17 LAB — GLUCOSE, CAPILLARY
Glucose-Capillary: 134 mg/dL — ABNORMAL HIGH (ref 70–99)
Glucose-Capillary: 136 mg/dL — ABNORMAL HIGH (ref 70–99)
Glucose-Capillary: 140 mg/dL — ABNORMAL HIGH (ref 70–99)
Glucose-Capillary: 142 mg/dL — ABNORMAL HIGH (ref 70–99)
Glucose-Capillary: 142 mg/dL — ABNORMAL HIGH (ref 70–99)
Glucose-Capillary: 143 mg/dL — ABNORMAL HIGH (ref 70–99)
Glucose-Capillary: 143 mg/dL — ABNORMAL HIGH (ref 70–99)
Glucose-Capillary: 143 mg/dL — ABNORMAL HIGH (ref 70–99)
Glucose-Capillary: 146 mg/dL — ABNORMAL HIGH (ref 70–99)
Glucose-Capillary: 146 mg/dL — ABNORMAL HIGH (ref 70–99)
Glucose-Capillary: 148 mg/dL — ABNORMAL HIGH (ref 70–99)
Glucose-Capillary: 153 mg/dL — ABNORMAL HIGH (ref 70–99)
Glucose-Capillary: 155 mg/dL — ABNORMAL HIGH (ref 70–99)
Glucose-Capillary: 167 mg/dL — ABNORMAL HIGH (ref 70–99)
Glucose-Capillary: 178 mg/dL — ABNORMAL HIGH (ref 70–99)

## 2019-10-17 LAB — TYPE AND SCREEN
ABO/RH(D): O POS
Antibody Screen: NEGATIVE
Unit division: 0
Unit division: 0

## 2019-10-17 LAB — BPAM RBC
Blood Product Expiration Date: 202105292359
Blood Product Expiration Date: 202105292359
ISSUE DATE / TIME: 202105030807
ISSUE DATE / TIME: 202105030807
Unit Type and Rh: 5100
Unit Type and Rh: 5100

## 2019-10-17 LAB — MAGNESIUM
Magnesium: 2.2 mg/dL (ref 1.7–2.4)
Magnesium: 2.1 mg/dL (ref 1.7–2.4)

## 2019-10-17 IMAGING — DX DG CHEST 1V PORT
1 series · 1 of 1 positions shown · non-contrast
Comparison: [DATE].

CLINICAL DATA: Status post aortic valve repair.

EXAM:
PORTABLE CHEST 1 VIEW

[chest]
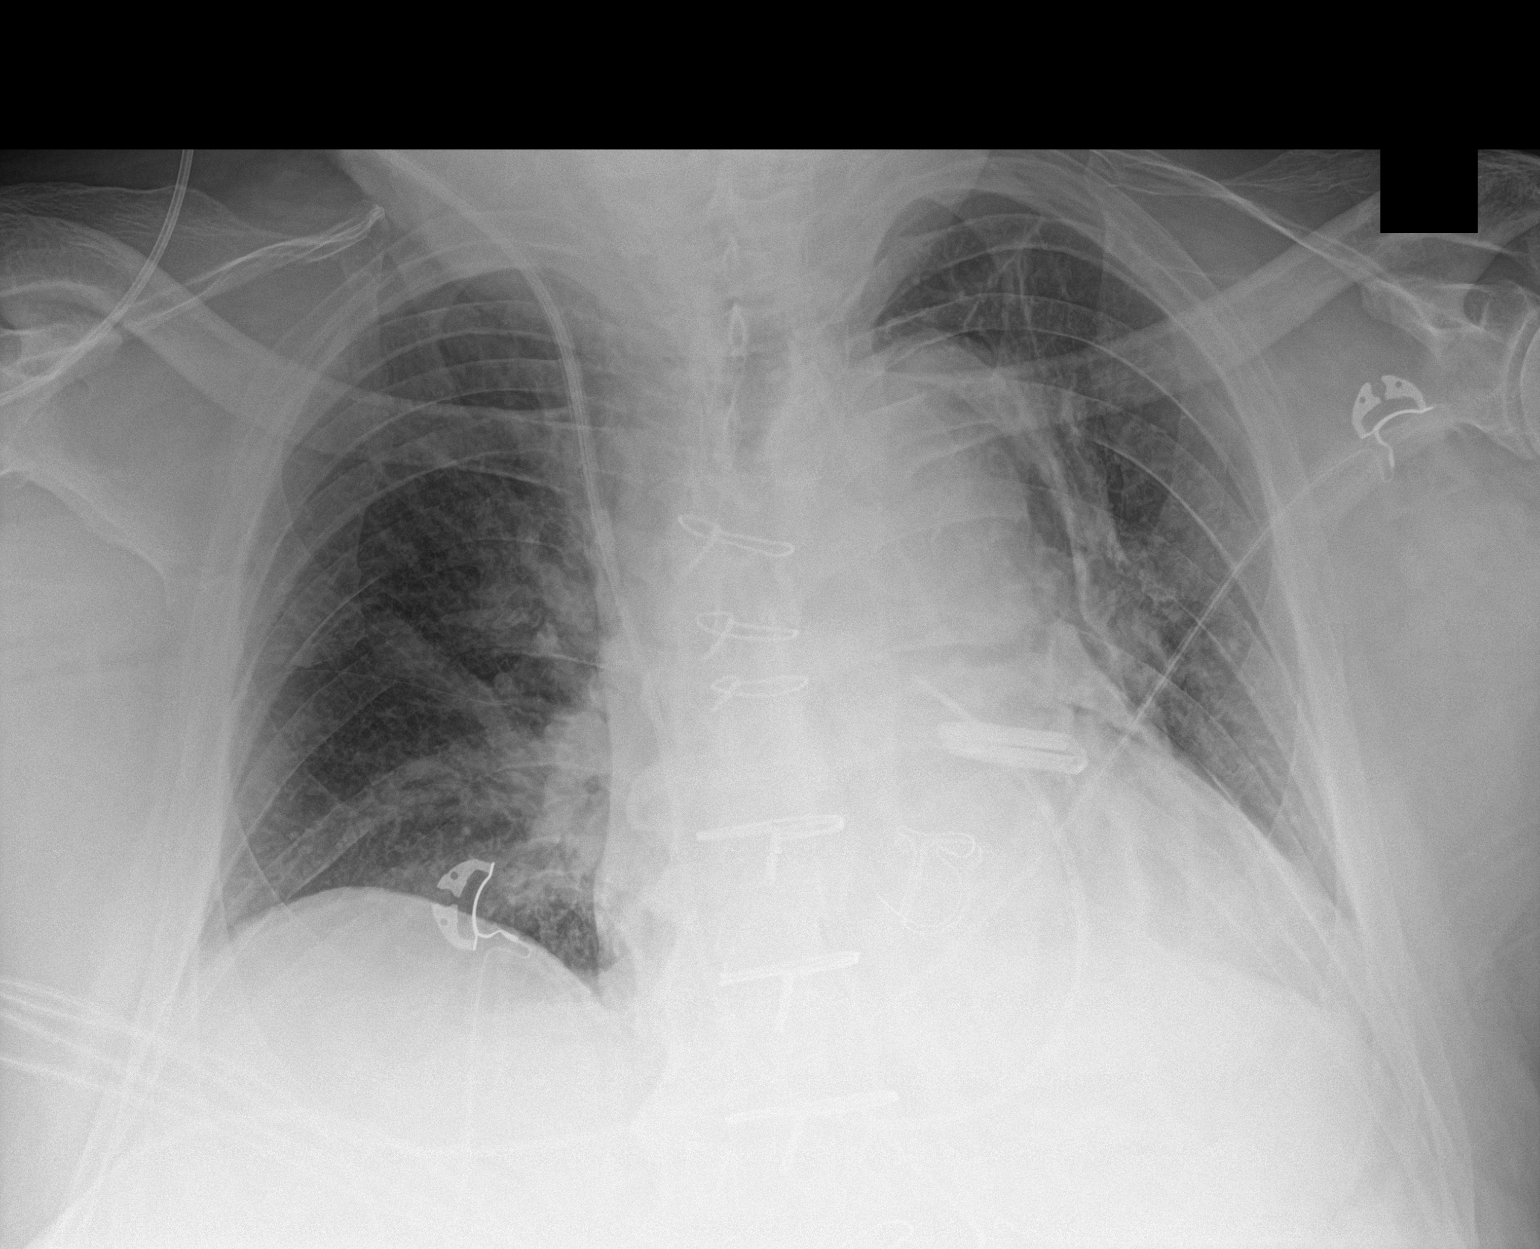

[1 of 1 positions shown; findings below may reference images not displayed]

FINDINGS: Stable cardiomegaly. Endotracheal and nasogastric tubes have been
removed. Right internal jugular Swan-Ganz catheter is noted with tip
in expected position of main pulmonary artery. Right lung is clear.
Left perihilar atelectasis is noted. Bony thorax is unremarkable.
IMPRESSION: Left perihilar atelectasis. Endotracheal and nasogastric tubes have
been removed. Right internal jugular Swan-Ganz catheter tip is noted
in expected position of main pulmonary artery.

## 2019-10-17 MED ORDER — ORAL CARE MOUTH RINSE
15.0000 mL | Freq: Two times a day (BID) | OROMUCOSAL | Status: DC
  Administered 2019-10-17 – 2019-10-22 (×10): 15 mL via OROMUCOSAL

## 2019-10-17 MED ORDER — AMIODARONE HCL 200 MG PO TABS
200.0000 mg | ORAL_TABLET | Freq: Every day | ORAL | Status: DC
  Administered 2019-10-17 – 2019-10-23 (×7): 200 mg via ORAL
  Filled 2019-10-17 (×7): qty 1

## 2019-10-17 MED ORDER — INSULIN ASPART 100 UNIT/ML ~~LOC~~ SOLN
0.0000 [IU] | SUBCUTANEOUS | Status: DC
  Administered 2019-10-17 (×2): 4 [IU] via SUBCUTANEOUS
  Administered 2019-10-17: 2 [IU] via SUBCUTANEOUS
  Administered 2019-10-18: 4 [IU] via SUBCUTANEOUS
  Administered 2019-10-18: 2 [IU] via SUBCUTANEOUS
  Administered 2019-10-18: 8 [IU] via SUBCUTANEOUS

## 2019-10-17 MED ORDER — INSULIN DETEMIR 100 UNIT/ML ~~LOC~~ SOLN
20.0000 [IU] | Freq: Every day | SUBCUTANEOUS | Status: DC
  Administered 2019-10-17 – 2019-10-18 (×2): 20 [IU] via SUBCUTANEOUS
  Filled 2019-10-17 (×3): qty 0.2

## 2019-10-17 MED ORDER — INSULIN DETEMIR 100 UNIT/ML ~~LOC~~ SOLN
20.0000 [IU] | Freq: Every day | SUBCUTANEOUS | Status: DC
Start: 2019-10-18 — End: 2019-10-17

## 2019-10-17 MED ORDER — FUROSEMIDE 10 MG/ML IJ SOLN
40.0000 mg | Freq: Once | INTRAMUSCULAR | Status: AC
  Administered 2019-10-17: 40 mg via INTRAVENOUS
  Filled 2019-10-17: qty 4

## 2019-10-17 MED ORDER — ENOXAPARIN SODIUM 40 MG/0.4ML ~~LOC~~ SOLN
40.0000 mg | Freq: Every day | SUBCUTANEOUS | Status: DC
  Administered 2019-10-17 – 2019-10-22 (×6): 40 mg via SUBCUTANEOUS
  Filled 2019-10-17 (×6): qty 0.4

## 2019-10-17 NOTE — Progress Notes (Signed)
[facility] DAILY ICU PROGRESS NOTE                   [address]            [place] [address]          [phone]   1 Day Post-Op Procedure(s) (LRB): AORTIC VALVE REPLACEMENT (AVR) using Edwards PERIMOUNT Magna Ease 25MM Bioprosthesis Aortic Valve. (N/A) SUPRA CORONARY REPLACEMENT OF ASCENDING AORTA using Maquet Hemashiel Platinum 34 MM Vascular Graft. (N/A) MAZE with bilateral pulmonary vein isolation. (N/A) TRANSESOPHAGEAL ECHOCARDIOGRAM (TEE) (N/A) Clipping Of Atrial Appendage using AtriCure AtriClip Exclusion VLAA 45mm. (N/A)  Total Length of Stay:  LOS: 1 day   Subjective: Patient awake alert neurologically intact extubated last night without difficulty.  Objective: Vital signs in last 24 hours: Temp:  [96.8 F (36 C)-99.1 F (37.3 C)] 99 F (37.2 C) (05/04 0700) Pulse Rate:  [88-95] 89 (05/04 0700) Cardiac Rhythm: Atrial paced (05/04 0428) Resp:  [12-24] 17 (05/04 0700) BP: (89-157)/(52-93) 115/82 (05/04 0046) SpO2:  [86 %-100 %] 96 % (05/04 0700) Arterial Line BP: (76-176)/(50-83) 144/77 (05/04 0700) FiO2 (%):  [30 %-50 %] 30 % (05/03 2339) Weight:  [139.6 kg] 139.6 kg (05/04 0640)  Filed Weights   10/16/19 0613 10/17/19 0640  Weight: 134.7 kg (!) 139.6 kg    Weight change: 4.902 kg   Hemodynamic parameters for last 24 hours: PAP: (23-47)/(11-28) 30/16 CO:  [5.4 L/min-8.9 L/min] 6.1 L/min CI:  [1.8 L/min/m2-3.4 L/min/m2] 2.3 L/min/m2  Intake/Output from previous day: 05/03 0701 - 05/04 0700 In: 5115.2 [I.V.:3615.2; Blood:500; IV Piggyback:1000.1] Out: [id number] [Urine:2815; Blood:800; Chest Tube:410]  Intake/Output this shift: No intake/output data recorded.  Current Meds: Scheduled Meds: . acetaminophen  1,000 mg Oral Q6H   Or  . acetaminophen (TYLENOL) oral liquid 160 mg/5 mL  1,000 mg Per Tube Q6H  . alfuzosin  10 mg Oral Q breakfast  . aspirin EC  325 mg Oral Daily   Or  . aspirin  324 mg Per Tube Daily  . atorvastatin  20 mg Oral  Daily  . bisacodyl  10 mg Oral Daily   Or  . bisacodyl  10 mg Rectal Daily  . Chlorhexidine Gluconate Cloth  6 each Topical Daily  . docusate sodium  200 mg Oral Daily  . metoCLOPramide (REGLAN) injection  10 mg Intravenous Q6H  . metoprolol tartrate  12.5 mg Oral BID   Or  . metoprolol tartrate  12.5 mg Per Tube BID  . [START ON 10/18/2019] pantoprazole  40 mg Oral Daily  . revefenacin  175 mcg Inhalation Daily  . silver sulfADIAZINE  1 application Topical Daily  . sodium chloride flush  10-40 mL Intracatheter Q12H  . sodium chloride flush  3 mL Intravenous Q12H  . umeclidinium bromide  1 puff Inhalation Daily   Continuous Infusions: . sodium chloride 20 mL/hr at 10/17/19 0700  . sodium chloride    . sodium chloride 10 mL/hr (10/16/19 1530)  . albumin human 12.5 g (10/16/19 1730)  . cefUROXime (ZINACEF)  IV 1.5 g (10/17/19 0737)  . famotidine (PEPCID) IV 20 mg (10/17/19 0857)  . insulin 3.6 mL/hr at 10/17/19 0700  . lactated ringers    . lactated ringers 20 mL/hr at 10/17/19 0700  . lactated ringers 20 mL/hr at 10/17/19 0700  . milrinone 0.125 mcg/kg/min (10/17/19 0700)  . nitroGLYCERIN 20 mcg/min (10/17/19 0700)  . norepinephrine (LEVOPHED) Adult infusion Stopped (10/16/19 1722)  . phenylephrine (NEO-SYNEPHRINE) Adult infusion  Stopped (10/16/19 1741)   PRN Meds:.sodium chloride, albumin human, dextrose, lactated ringers, metoprolol tartrate, midazolam, morphine injection, ondansetron (ZOFRAN) IV, oxyCODONE, sodium chloride flush, sodium chloride flush, traMADol, triazolam  General appearance: alert and cooperative Neurologic: intact Heart: regular rate and rhythm, S1, S2 normal, no murmur, click, rub or gallop Lungs: diminished breath sounds bibasilar Abdomen: soft, non-tender; bowel sounds normal; no masses,  no organomegaly Extremities: extremities normal, atraumatic, no cyanosis or edema Wound: Sternum stable, dressings intact, right arm neurovascularly intact with right  axillary artery cannulation during surgery  Lab Results: CBC: Recent Labs    10/16/19 2215 10/17/19 0420  WBC 11.7* 11.9*  HGB 11.9* 11.9*  HCT 36.7* 36.8*  PLT 120* 121*   BMET:  Recent Labs    10/16/19 2215 10/17/19 0420  NA 138 139  K 4.7 4.7  CL 110 110  CO2 22 22  GLUCOSE 178* 141*  BUN 14 14  CREATININE 0.97 0.89  CALCIUM 8.3* 8.5*    CMET: Lab Results  Component Value Date   WBC 11.9 (H) 10/17/2019   HGB 11.9 (L) 10/17/2019   HCT 36.8 (L) 10/17/2019   PLT 121 (L) 10/17/2019   GLUCOSE 141 (H) 10/17/2019   CHOL 148 05/29/2019   TRIG 255.0 (H) 05/29/2019   HDL 34.00 (L) 05/29/2019   LDLDIRECT 78.0 05/29/2019   LDLCALC 48 06/01/2018   ALT 26 10/12/2019   AST 20 10/12/2019   NA 139 10/17/2019   K 4.7 10/17/2019   CL 110 10/17/2019   CREATININE 0.89 10/17/2019   BUN 14 10/17/2019   CO2 22 10/17/2019   TSH 4.29 09/06/2019   PSA 4.75 (H) 05/29/2019   INR 1.4 (H) 10/16/2019   HGBA1C 6.3 (H) 10/12/2019   MICROALBUR 0.9 05/29/2019      PT/INR:  Recent Labs    10/16/19 1607  LABPROT 16.7*  INR 1.4*   Radiology: [facility] Chest Port 1 View  Result Date: 10/17/2019 CLINICAL DATA:  Status post aortic valve repair. EXAM: PORTABLE CHEST 1 VIEW COMPARISON:  Oct 16, 2019. FINDINGS: Stable cardiomegaly. Endotracheal and nasogastric tubes have been removed. Right internal jugular Swan-Ganz catheter is noted with tip in expected position of main pulmonary artery. Right lung is clear. Left perihilar atelectasis is noted. Bony thorax is unremarkable. IMPRESSION: Left perihilar atelectasis. Endotracheal and nasogastric tubes have been removed. Right internal jugular Swan-Ganz catheter tip is noted in expected position of main pulmonary artery. Electronically Signed   By: Marijo Conception M.D.   On: 10/17/2019 08:48   DG Chest Port 1 View  Result Date: 10/16/2019 CLINICAL DATA:  Aortic valve replacement EXAM: PORTABLE CHEST 1 VIEW COMPARISON:  10/12/2019 FINDINGS: Interval  postsurgical changes to the chest status post aortic valve replacement. Endotracheal tube terminates approximately 6 cm superior to the carina. Enteric tube courses below the diaphragm with distal tip beyond the inferior margin of the film. Right IJ approach Swan-Ganz catheter terminates at the level of the pulmonary trunk. Atrial appendage clip. Median sternotomy wires. Low lung volumes with stable heart size. Widening of the mediastinum is likely postsurgical. Patchy bibasilar opacities favored to reflect atelectasis. No pneumothorax is seen. IMPRESSION: 1. Interval postsurgical changes to the chest status post aortic valve replacement. Lines and tubes as above. 2. Patchy bibasilar opacities favored to reflect atelectasis. No pneumothorax. Electronically Signed   By: Davina Poke D.O.   On: 10/16/2019 16:26     Assessment/Plan: S/P Procedure(s) (LRB): AORTIC VALVE REPLACEMENT (AVR) using [facility] Ease 25MM  Bioprosthesis Aortic Valve. (N/A) SUPRA CORONARY REPLACEMENT OF ASCENDING AORTA using Maquet Hemashiel Platinum 34 MM Vascular Graft. (N/A) MAZE with bilateral pulmonary vein isolation. (N/A) TRANSESOPHAGEAL ECHOCARDIOGRAM (TEE) (N/A) Clipping Of Atrial Appendage using AtriCure AtriClip Exclusion VLAA 64mm. (N/A) Mobilize Diuresis Diabetes control d/c tubes/lines See progression orders Patient remains in sinus rhythm Expected Acute  Blood - loss Anemia- continue to monitor -no transfusions have been given Currently on aspirin, DVT prophylaxis Lovenox, will wait on restarting Xarelto till the risk of postop bleeding has decreased   Clinton Black 10/17/2019 9:07 AM

## 2019-10-17 NOTE — Op Note (Signed)
NAME: Clinton Black, Clinton Black ACCOUNT [id number] DATE OF BIRTH:[birth year] FACILITY: [facility] LOCATION: [facility] PHYSICIAN: Maryruth Bun, MD  OPERATIVE REPORT  DATE OF PROCEDURE:  10/16/2019  PREOPERATIVE DIAGNOSES:   1.  Severe aortic insufficiency. 2.  Dilated ascending aorta.   3.  History of atrial fibrillation.  POSTOPERATIVE DIAGNOSES:   1.  Severe aortic insufficiency. 2.  Dilated ascending aorta.   3.  History of atrial fibrillation.  SURGICAL PROCEDURES:   1.  Replacement of aortic valve with 25 mm pericardial tissue valve, Edwards Lifesciences, model 3300TFX, serial [id number]. 2.  Supracoronary replacement of ascending aorta with 34 mm Hemashield platinum graft. 3.  Modified maze procedure with bilateral pulmonary vein isolation. 4.  Placement of a 50 mm AtriCure AtriClip, left atrial appendage.  5.  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  SURGEON:  Lanelle Bal, MD  FIRST ASSISTANT:  Ellwood Handler, PA-C  BRIEF HISTORY:  The patient is a 74 year old male with known dilated ascending aorta.  He has been followed for this.  In addition, was noted to have aortic insufficiency.   He had episodes of atrial fibrillation and with EP lab ablation, he had  converted to sinus rhythm, but did require 1 cardioversion.  As we follow the patient's ascending aorta, the size had increased slightly to 5.2 cm.  The patient has a body surface area of 2.7 and a weight of 296 pounds.  Because of some enlargement of  his ascending aorta, but mostly progression of the degree of aortic insufficiency to severe with symptoms and reduction of LV function on echocardiogram, replacement of ascending aortic valve, maze procedure was recommended to the patient.  Previous  cardiac catheterization had been done 6 months previously and showed no evidence of coronary artery disease.  Risks and options of the surgery were discussed with the patient in  detail.  DESCRIPTION OF PROCEDURE:  The patient underwent general endotracheal anesthesia without incident.  Swan-Ganz arterial lines had been placed.  Dr. Ermalene Postin placed a TEE probe, confirming 3.4 cm aorta at the sinotubular junction, less than 4 cm at sinus of  Valsalva, 5.3 cm mid ascending aorta with severe aortic insufficiency, LV enlargement with normal ejection fraction.  The skin of the chest and legs was prepped with Betadine, draped in the usual sterile manner.  Appropriate timeout was performed.  We  then proceeded with median sternotomy.  The pericardium was opened and pericardial cradle created.  On examination of the ascending aorta, it appeared that we would be able to place a crossclamp, but to ensure enough space for this, it was decided to  proceed with right subclavian artery cannulation.  We then made an incision in the pectoral deltoid groove, dissection carried down to the pectoralis major muscle.  Fibers were divided and separated.  Retraction gave good visualization of the pectoralis  minor muscle.  This was divided and a large right subclavian artery was identified, dissected free and loops were placed around the subclavian artery.  The brachial plexus was identified.  Care was taken to avoid any undue traction.  The patient was then  systemically heparinized.  Kelly clamps were placed proximally and distally on the subclavian artery.  The artery was opened.  It was somewhat thickened, but was a very large vessel and with the patient's large size, we selected a 10 mm graft for  arterial inflow.  This graft was sewed to the subclavian artery with a running 5-0 Prolene.  The graft was  de-aired and connected to our arterial inflow line.  We then returned to placement of a retrograde cardioplegic catheter with direct visualization  by TEE.  A dual stage venous cannula was also placed.  The patient was then placed on cardiopulmonary bypass 2.4 liters per minute per meter square.  A right  superior pulmonary vein vent was placed.  An aortic root vent cardioplegia catheter was placed.   The atrial appendage base was measured at 45 mm.  A 50 mm AtriClip was selected.  Loops were placed around the pulmonary veins.  Aortic crossclamp was then applied right at the takeoff of the innominate artery.  We gave initial cold blood cardioplegia  antegrade until arrest was obtained.  We then converted to retrograde cold potassium cardioplegia, a total of a liter.  Myocardial septal temperature was monitored.  Topical cold was monitored.  We then proceeded first with modified maze procedure with  bilateral pulmonary vein isolations.  The bipolar device was placed around the left pulmonary veins with 2 ablations at 3 repositionings of the device.  We then in a similar fashion, did bipolar ablation across the base of the left atrial appendage, 2  ablations with each of 2 applications of the device.  The AtriClip was then applied to the left atrial appendage.  We then turned our attention to the right pulmonary veins.  The previously placed stent in the right superior pulmonary vein was removed  temporarily and in a similar fashion, the right veins were ablated with 2 ablations with each of 3 applications of the device.  The right superior pulmonary vein was then replaced.  We then turned our attention to the ascending aorta.  The ascending  aorta was opened in the midportion.  This gave good visualization of a trileaflet aortic valve.  We measured this to match a 3.4 cm Hemashield graft at the sinotubular ridge above the coronary ostium.  We then excised the aortic valve, sized the annulus  for a 25 mm pericardial tissue valve.  Two Ti-Cron pledgeted sutures in a circumferential manner were placed around the annulus.  The 25 mm pericardial valve was then secured in place and seated well with no impingement of the right or left coronary  ostium.  We then implanted the 34 mm Hemashield graft supracoronary over  felt strips with a running 4-0 Prolene.  The distal extent of the graft was then trimmed to the appropriate length.  With the crossclamp still in place, a distal anastomosis was  performed in a similar fashion over felt strips and a running 4-0 Prolene.  In the aortic root vent intermittently during the procedure, approximately every 30 minutes, additional retrograde cold blood cardioplegia was administered.  Prior to completion  of the distal anastomosis, the heart was allowed to passively fill and deair.  CO2 insufflation of the pericardial sac had been performed.  The proximal and distal anastomoses were intact.  CoSeal was applied.  An aortic root vent cardioplegia needle was  introduced back into the ascending aortic graft.  Heart was allowed to passively fill and deair and the aortic crossclamp was removed with a total crossclamp time of 132 minutes.  The patient spontaneously converted to a sinus rhythm with a slow rate.   Sites of anastomosis were inspected.  The retrograde cardioplegic catheter was removed.  The patient's body temperature was rewarmed to 37 degrees.  TEE showed good functioning of the newly placed aortic prosthesis.  The right superior pulmonary vein  vent was removed.  The patient was then ventilated and weaned from cardiopulmonary bypass on low-dose Levophed and milrinone.  With separation from bypass, he remained hemodynamically stable.  Atrial and ventricular pacing wires were applied.  The  anastomotic suture lines were without significant bleeding.  The venous cannula was removed.  Protamine was administered.  We then divided the graft sewed to the right subclavian artery with a vascular stapler.  With the operative field hemostatic, the  pericardium was loosely approximated.  Two pericardial Blake drains were left in place.  Sternum was closed with #6 stainless steel wire.  Fascia closed with interrupted 0 Vicryl, running 3-0 Vicryl subcutaneous tissue, 3-0 subcuticular stitch  in skin  edges.  The interrupted 2-0 Vicryls were used to reapproximate the pectoralis major muscle.  Subcutaneous tissue closed with a running 3-0 Vicryl and a 4-0 subcuticular stitch in skin edges.  Dry dressings were applied to the right subclavian artery  incision and to the sternum.  Sponge and needle count was reported as correct at completion of the procedure.  The patient tolerated the procedure without obvious complication.  At completion of the procedure, he was in sinus rhythm and atrially paced at  90 to increased rate.  Total pump time was 175 minutes.  Total crossclamp time 132 minutes.  The patient did not require any blood bank blood products during the operative procedure.  VN/NUANCE  D:10/17/2019 T:10/17/2019 JOB:[id number]

## 2019-10-17 NOTE — Progress Notes (Signed)
      [address]       [place] [address]             [phone]      POD # 1  Resting comfortably  BP 111/76   Pulse 79   Temp 98.2 F (36.8 C) (Oral)   Resp 17   Ht 6' (1.829 m)   Wt (!) 139.6 kg   SpO2 95%   BMI 41.73 kg/m   Intake/Output Summary (Last 24 hours) at 10/17/2019 1832 Last data filed at 10/17/2019 1600 Gross per 24 hour  Intake 2006.14 ml  Output 3040 ml  Net -1033.86 ml   K= 4.3, creatinine 1.2, Hct 39  Doing well POD # 1   C. Roxan Hockey, MD [facility] [phone]

## 2019-10-17 NOTE — Progress Notes (Signed)
RN holding off on ABG for this AM d/t pt being extubated yesterday and being on cpap/bipap most of the night.  RT spoke with RN and RN stated he would not get the gas unless it is necessary.  RT will continue to monitor.

## 2019-10-17 NOTE — Progress Notes (Signed)
Patient resting comfortably on 4L [place] at this time. Spo2 95%. No respiratory distress noted. BIPAP is not needed at this time. RT will monitor as needed.

## 2019-10-17 NOTE — Progress Notes (Signed)
RT changed mode from CPAP to BIPAP due to low RR and VT. Patient is tolerating BIPAP settings well at this time. RT will continue to monitor.

## 2019-10-18 ENCOUNTER — Inpatient Hospital Stay (HOSPITAL_COMMUNITY): Payer: Medicare HMO

## 2019-10-18 LAB — CBC
HCT: 36.5 % — ABNORMAL LOW (ref 39.0–52.0)
Hemoglobin: 11.7 g/dL — ABNORMAL LOW (ref 13.0–17.0)
MCH: 30.3 pg (ref 26.0–34.0)
MCHC: 32.1 g/dL (ref 30.0–36.0)
MCV: 94.6 fL (ref 80.0–100.0)
Platelets: 110 10*3/uL — ABNORMAL LOW (ref 150–400)
RBC: 3.86 MIL/uL — ABNORMAL LOW (ref 4.22–5.81)
RDW: 13.7 % (ref 11.5–15.5)
WBC: 13.6 10*3/uL — ABNORMAL HIGH (ref 4.0–10.5)
nRBC: 0 % (ref 0.0–0.2)

## 2019-10-18 LAB — BASIC METABOLIC PANEL
Anion gap: 11 (ref 5–15)
BUN: 15 mg/dL (ref 8–23)
CO2: 22 mmol/L (ref 22–32)
Calcium: 8.4 mg/dL — ABNORMAL LOW (ref 8.9–10.3)
Chloride: 103 mmol/L (ref 98–111)
Creatinine, Ser: 0.96 mg/dL (ref 0.61–1.24)
GFR calc Af Amer: >60 mL/min (ref >60)
GFR calc non Af Amer: >60 mL/min (ref >60)
Glucose, Bld: 135 mg/dL — ABNORMAL HIGH (ref 70–99)
Potassium: 4.1 mmol/L (ref 3.5–5.1)
Sodium: 136 mmol/L (ref 135–145)

## 2019-10-18 LAB — GLUCOSE, CAPILLARY
Glucose-Capillary: 126 mg/dL — ABNORMAL HIGH (ref 70–99)
Glucose-Capillary: 201 mg/dL — ABNORMAL HIGH (ref 70–99)
Glucose-Capillary: 192 mg/dL — ABNORMAL HIGH (ref 70–99)
Glucose-Capillary: 201 mg/dL — ABNORMAL HIGH (ref 70–99)
Glucose-Capillary: 216 mg/dL — ABNORMAL HIGH (ref 70–99)

## 2019-10-18 LAB — SURGICAL PATHOLOGY

## 2019-10-18 IMAGING — DX DG CHEST 1V PORT
1 series · 1 of 1 positions shown · non-contrast
Comparison: [DATE]

CLINICAL DATA: Sore chest post AVR

EXAM:
PORTABLE CHEST 1 VIEW

[chest ap]
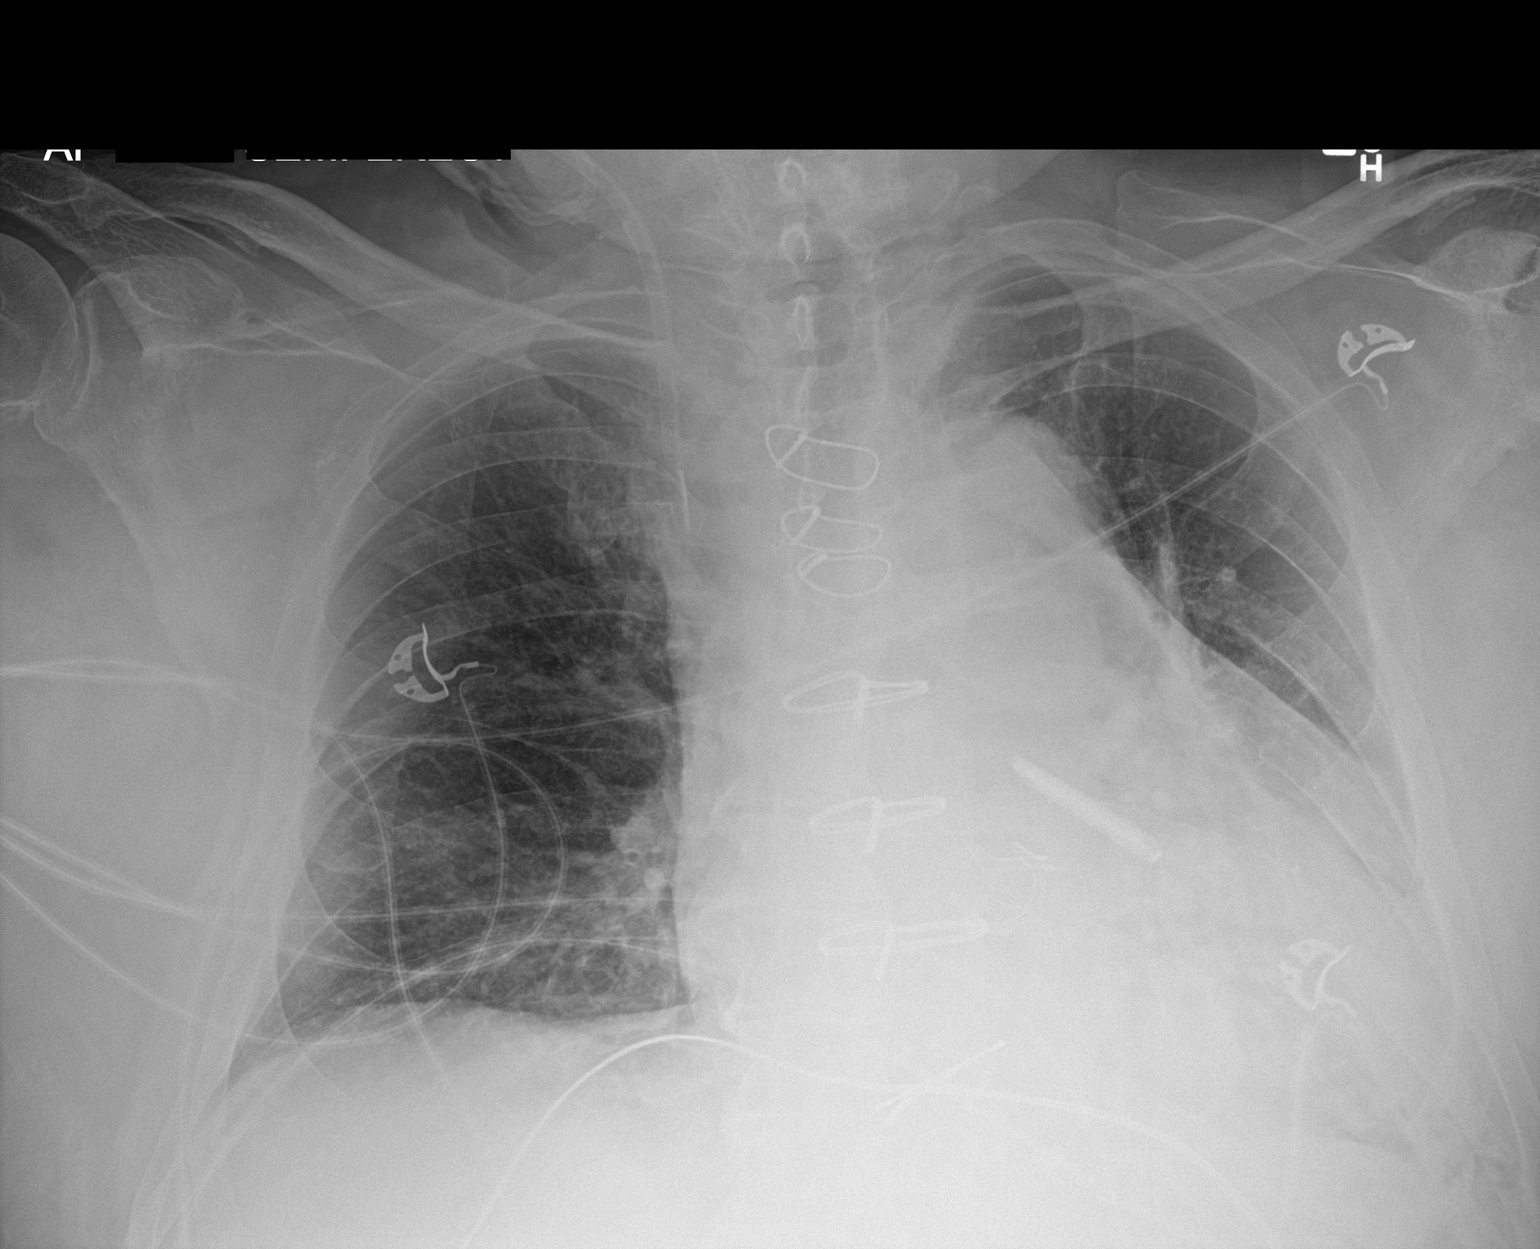

[1 of 1 positions shown; findings below may reference images not displayed]

FINDINGS: Postop median sternotomy and aortic valve replacement. Surgical clip
also overlies the heart.

Swan-Ganz catheter removed. Right jugular sheath remains in the
proximal SVC.

Progression of left lower lobe airspace disease likely atelectasis.
Possible small effusion.

Negative for pneumothorax
IMPRESSION: Negative for pneumothorax

Progressive left lower lobe airspace disease and small left
effusion. Negative for edema.

## 2019-10-18 MED ORDER — INSULIN ASPART 100 UNIT/ML ~~LOC~~ SOLN
0.0000 [IU] | Freq: Three times a day (TID) | SUBCUTANEOUS | Status: DC
  Administered 2019-10-18 (×2): 8 [IU] via SUBCUTANEOUS
  Administered 2019-10-19: 2 [IU] via SUBCUTANEOUS
  Administered 2019-10-19: 4 [IU] via SUBCUTANEOUS
  Administered 2019-10-19 – 2019-10-20 (×4): 2 [IU] via SUBCUTANEOUS
  Administered 2019-10-20: 4 [IU] via SUBCUTANEOUS
  Administered 2019-10-21 – 2019-10-22 (×3): 2 [IU] via SUBCUTANEOUS
  Administered 2019-10-22: 4 [IU] via SUBCUTANEOUS
  Administered 2019-10-23 (×2): 2 [IU] via SUBCUTANEOUS

## 2019-10-18 MED ORDER — DOCUSATE SODIUM 100 MG PO CAPS
200.0000 mg | ORAL_CAPSULE | Freq: Every day | ORAL | Status: DC
  Administered 2019-10-18 – 2019-10-23 (×6): 200 mg via ORAL
  Filled 2019-10-18 (×6): qty 2

## 2019-10-18 MED ORDER — ~~LOC~~ CARDIAC SURGERY, PATIENT & FAMILY EDUCATION: Freq: Once | Status: AC

## 2019-10-18 MED ORDER — MAGNESIUM HYDROXIDE 400 MG/5ML PO SUSP: 30.0000 mL | Freq: Every day | ORAL | Status: DC | PRN

## 2019-10-18 MED ORDER — TRAMADOL HCL 50 MG PO TABS
50.0000 mg | ORAL_TABLET | ORAL | Status: DC | PRN
  Administered 2019-10-20 – 2019-10-21 (×2): 100 mg via ORAL
  Filled 2019-10-18 (×2): qty 2

## 2019-10-18 MED ORDER — SODIUM CHLORIDE 0.9% FLUSH
3.0000 mL | Freq: Two times a day (BID) | INTRAVENOUS | Status: DC
  Administered 2019-10-18 – 2019-10-23 (×10): 3 mL via INTRAVENOUS

## 2019-10-18 MED ORDER — ONDANSETRON HCL 4 MG/2ML IJ SOLN: 4.0000 mg | Freq: Four times a day (QID) | INTRAMUSCULAR | Status: DC | PRN

## 2019-10-18 MED ORDER — FUROSEMIDE 10 MG/ML IJ SOLN
40.0000 mg | Freq: Once | INTRAMUSCULAR | Status: AC
  Administered 2019-10-18: 40 mg via INTRAVENOUS
  Filled 2019-10-18: qty 4

## 2019-10-18 MED ORDER — SODIUM CHLORIDE 0.9% FLUSH: 3.0000 mL | INTRAVENOUS | Status: DC | PRN

## 2019-10-18 MED ORDER — BISACODYL 10 MG RE SUPP: 10.0000 mg | Freq: Every day | RECTAL | Status: DC | PRN

## 2019-10-18 MED ORDER — ONDANSETRON HCL 4 MG PO TABS: 4.0000 mg | ORAL_TABLET | Freq: Four times a day (QID) | ORAL | Status: DC | PRN

## 2019-10-18 MED ORDER — BISACODYL 5 MG PO TBEC
10.0000 mg | DELAYED_RELEASE_TABLET | Freq: Every day | ORAL | Status: DC | PRN
  Administered 2019-10-21 – 2019-10-22 (×2): 10 mg via ORAL
  Filled 2019-10-18 (×2): qty 2

## 2019-10-18 MED ORDER — ACETAMINOPHEN 325 MG PO TABS
650.0000 mg | ORAL_TABLET | Freq: Four times a day (QID) | ORAL | Status: DC | PRN
  Administered 2019-10-20: 650 mg via ORAL
  Filled 2019-10-18: qty 2

## 2019-10-18 MED ORDER — SODIUM CHLORIDE 0.9 % IV SOLN: 250.0000 mL | INTRAVENOUS | Status: DC | PRN

## 2019-10-18 MED ORDER — OXYCODONE HCL 5 MG PO TABS
5.0000 mg | ORAL_TABLET | ORAL | Status: DC | PRN
  Administered 2019-10-18 – 2019-10-20 (×6): 5 mg via ORAL
  Administered 2019-10-21 – 2019-10-22 (×3): 10 mg via ORAL
  Filled 2019-10-18: qty 1
  Filled 2019-10-18: qty 2
  Filled 2019-10-18 (×3): qty 1
  Filled 2019-10-18: qty 2
  Filled 2019-10-18: qty 1
  Filled 2019-10-18 (×2): qty 2
  Filled 2019-10-18: qty 1

## 2019-10-18 MED ORDER — SILVER SULFADIAZINE 1 % EX CREA
1.0000 "application " | TOPICAL_CREAM | Freq: Two times a day (BID) | CUTANEOUS | Status: DC
  Administered 2019-10-18 – 2019-10-23 (×10): 1 via TOPICAL
  Filled 2019-10-18 (×2): qty 85

## 2019-10-18 MED ORDER — PANTOPRAZOLE SODIUM 40 MG PO TBEC
40.0000 mg | DELAYED_RELEASE_TABLET | Freq: Every day | ORAL | Status: DC
  Administered 2019-10-19 – 2019-10-23 (×5): 40 mg via ORAL
  Filled 2019-10-18 (×5): qty 1

## 2019-10-18 NOTE — Discharge Summary (Signed)
Physician Discharge Summary        [address]       [place] [address]             [phone]       Patient ID: Clinton Black MRN: [id number] DOB/AGE: 1946-04-12 74 y.o.  Admit date: 10/16/2019 Discharge date: 10/23/2019  Admission Diagnoses:  Patient Active Problem List   Diagnosis Date Noted  . BRBPR (bright red blood per rectum) 09/06/2019  . Mucopurulent chronic bronchitis ([place]) 05/30/2019  . Benign prostatic hyperplasia without lower urinary tract symptoms 05/29/2019  . BPH with elevated PSA 05/29/2019  . Thoracic aortic aneurysm ([place]) 01/30/2019  . TSH elevation 10/22/2018  . Persistent atrial fibrillation ([place]) 06/01/2018  . Claudication of both lower extremities ([place]) 07/15/2017  . Chronic diastolic CHF (congestive heart failure), NYHA class 2 ([place]) 07/06/2017  . Mild tetrahydrocannabinol (THC) abuse 12/30/2016  . Essential hypertension 12/24/2016  . Hyperlipidemia LDL goal <130 05/11/2016  . Type 2 diabetes mellitus with complication, without long-term current use of insulin ([place]) 05/11/2016  . Routine general medical examination at a health care facility 10/06/2013  . COLONIC POLYPS 05/28/2009  . Obesity, morbid ([place]) 05/28/2009  . 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 DEGENERATIVE JOINT DISEASE 05/28/2009  . Insomnia w/ sleep apnea 05/28/2009    Discharge Diagnoses:  Active Problems:   S/P ascending aortic replacement   Discharged Condition: good  HPI:   Clinton Black 74 y.o. male who has been  followed in office for ascending aortic dilatation and aortic insufficiency.  He notes recently his urologist Dr. Loel Lofty called him and noted that his PSA had been rising and was recommending a biopsy in the near future.   He is now status post AF ablation 04/07/2019. Unfortunately he did go back into atrial fibrillation then noted by cardiac monitoring. He is currently on amiodarone. He had his cardioversion 06/28/2019.  Rhythm  strip in the office today revealed the patient was in sinus rhythm at approximately 60   Patient has no family history of aortic dissection he does note that his father died at age 83 suddenly with a cerebral embolus mother died at age 29 he is had one sister died at age 51 of breast cancer and another sister has had breast cancer in atrial fib still alive patient has no children no family history of aortic dissections or aneurysm.  When initially the patient was converted to sinus rhythm he noted significant improvement in his overall symptoms.  Now in spite of being in sinus rhythm he notes increasing shortness of breath with exertion.  He is able to maintain usual activities around the house he does care for his fiance who has significant neuropathy and needs extensive care.   With known AI he has had increasing SOB with exertion. Class C heart failure symptoms.    Hospital Course:   [name] underwent a replacement of an aortic valve with a pericardial tissue valve, replacement of the ascending aorta, modified maze procedure with bilateral pulmonary vein isolation, and a left atrial appendage clip with Dr. Servando Snare. He tolerated the procedure well and was transferred to the surgical ICU for continued care. He was extubated in a timely manner. POD 1 we removed his chest tubes and line. We started lovenox for DVT prophylaxis. He remained hemodynamically stable and in normal sinus rhythm. He did have a diaphoretic episode which passed quickly with deep breathing. He was off all drips and his chest tubes were  removed. His central line and foley were removed POD 2. He was stable to transfer to the stepdown unit on 10/21/2019. His epicardial pacing wires were removed on 10/22/2019. He continued to progress on the floor. Today, he is ambulating with limited assistance, tolerating room air, his incisions are healing well, and he is ready for discharge home.    Consults: None  Significant  Diagnostic Studies:  CLINICAL DATA:  Sore chest post AVR  EXAM: PORTABLE CHEST 1 VIEW  COMPARISON:  10/17/2019  FINDINGS: Postop median sternotomy and aortic valve replacement. Surgical clip also overlies the heart.  Swan-Ganz catheter removed. Right jugular sheath remains in the proximal SVC.  Progression of left lower lobe airspace disease likely atelectasis. Possible small effusion.  Negative for pneumothorax  IMPRESSION: Negative for pneumothorax  Progressive left lower lobe airspace disease and small left effusion. Negative for edema.   Electronically Signed   By: Franchot Gallo M.D.   On: 10/18/2019 09:30   Treatments:  NAME: Clinton Black, Clinton Black [id number] ACCOUNT [id number] DATE OF BIRTH:07-17-1945 [facility] LOCATION: [facility] PHYSICIAN:EDWARD Maryruth Bun, MD  OPERATIVE REPORT  DATE OF PROCEDURE:  10/16/2019  PREOPERATIVE DIAGNOSES:   1.  Severe aortic insufficiency. 2.  Dilated ascending aorta.   3.  History of atrial fibrillation.  POSTOPERATIVE DIAGNOSES:   1.  Severe aortic insufficiency. 2.  Dilated ascending aorta.   3.  History of atrial fibrillation.  SURGICAL PROCEDURES:   1.  Replacement of aortic valve with 25 mm pericardial tissue valve, Edwards Lifesciences, model 3300TFX, serial [id number]. 2.  Supracoronary replacement of ascending aorta with 34 mm Hemashield platinum graft. 3.  Modified maze procedure with bilateral pulmonary vein isolation. 4.  Placement of a 50 mm AtriCure AtriClip, left atrial appendage.  5.  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  SURGEON:  Clinton Bal, MD  FIRST ASSISTANT:  Clinton Handler, PA-C  BRIEF HISTORY:  The patient is a 74 year old male with known dilated ascending aorta.  He has been followed for this.  In addition, was noted to have aortic insufficiency.   He had episodes of atrial fibrillation and with EP lab ablation, he had    converted to sinus rhythm, but did require 1 cardioversion.  As we follow the patient's ascending aorta, the size had increased slightly to 5.2 cm.  The patient has a body surface area of 2.7 and a weight of 296 pounds.  Because of some enlargement of  his ascending aorta, but mostly progression of the degree of aortic insufficiency to severe with symptoms and reduction of LV function on echocardiogram, replacement of ascending aortic valve, maze procedure was recommended to the patient.  Previous  cardiac catheterization had been done 6 months previously and showed no evidence of coronary artery disease.  Risks and options of the surgery were discussed with the patient in detail.  Discharge Exam: Blood pressure 128/80, pulse 84, temperature 98.8 F (37.1 C), temperature source Oral, resp. rate 18, height 6\' 5"  (1.956 m), weight (!) 137.8 kg, SpO2 96 %.    General appearance: alert, cooperative and no distress Heart: regular rate and rhythm, S1, S2 normal, no murmur, click, rub or gallop Lungs: clear to auscultation bilaterally Abdomen: soft, non-tender; bowel sounds normal; no masses,  no organomegaly Extremities: extremities normal, atraumatic, no cyanosis or edema Wound: clean and dry  Disposition: Discharge disposition: 01-Home or Self Care       Discharge Instructions    Amb Referral to Cardiac Rehabilitation  Complete by: As directed    Diagnosis: Valve Replacement   Valve: Aortic   After initial evaluation and assessments completed: Virtual Based Care may be provided alone or in conjunction with Phase 2 Cardiac Rehab based on patient barriers.: Yes     Allergies as of 10/23/2019      Reactions   Quinolones Other (See Comments)   Patient was warned about not using Cipro and similar antibiotics. Recent studies have raised concern that fluoroquinolone antibiotics could be associated with an increased risk of aortic aneurysm Fluoroquinolones have non-antimicrobial properties  that might jeopardise the integrity of the extracellular matrix of the vascular wall In a  propensity score matched cohort study in [place], there was a 66% increased rate of aortic aneurysm or dissection associated with oral fluoroquinolone use, compared wit      Medication List    STOP taking these medications   APPLE CIDER VINEGAR PO   GARLIC PO   losartan 25 MG tablet Commonly known as: COZAAR   metoprolol succinate 25 MG 24 hr tablet Commonly known as: TOPROL-XL   TURMERIC PO   Yupelri 175 MCG/3ML nebulizer solution Generic drug: revefenacin     TAKE these medications   acetaminophen 325 MG tablet Commonly known as: TYLENOL Take 2 tablets (650 mg total) by mouth every 6 (six) hours as needed for mild pain.   alfuzosin 10 MG 24 hr tablet Commonly known as: UROXATRAL Take 1 tablet (10 mg total) by mouth daily with breakfast.   amiodarone 200 MG tablet Commonly known as: PACERONE Take 1 tablet (200 mg total) by mouth daily.   aspirin EC 81 MG tablet Take 1 tablet (81 mg total) by mouth daily.   atorvastatin 20 MG tablet Commonly known as: LIPITOR TAKE 1 TABLET BY MOUTH EVERYDAY AT BEDTIME What changed: See the new instructions.   furosemide 40 MG tablet Commonly known as: LASIX Take 1 tablet (40 mg total) by mouth daily. Start taking on: Oct 24, 2019   hydrocortisone cream 1 % Apply 1 application topically daily as needed (rash).   metoprolol tartrate 25 MG tablet Commonly known as: LOPRESSOR Take 0.5 tablets (12.5 mg total) by mouth 2 (two) times daily.   oxyCODONE 5 MG immediate release tablet Commonly known as: Oxy IR/ROXICODONE Take 1 tablet (5 mg total) by mouth every 6 (six) hours as needed for severe pain.   potassium chloride SA 20 MEQ tablet Commonly known as: KLOR-CON Take 1 tablet (20 mEq total) by mouth daily. Start taking on: Oct 24, 2019   [facility] PAIN RELIEF PATCH EX Apply 1 patch topically daily as needed (back pain).   Spiriva  Respimat 1.25 MCG/ACT Aers Generic drug: Tiotropium Bromide Monohydrate Inhale 2 puffs into the lungs daily.   spironolactone 25 MG tablet Commonly known as: ALDACTONE TAKE 0.5 TABLETS (12.5 MG TOTAL) BY MOUTH AT BEDTIME.   Synjardy XR 25-1000 MG Tb24 Generic drug: Empagliflozin-metFORMIN HCl ER Take 1 tablet by mouth daily.   triazolam 0.25 MG tablet Commonly known as: HALCION TAKE 1 TABLET (0.25 MG TOTAL) BY MOUTH AT BEDTIME AS NEEDED FOR SLEEP.   Xarelto 20 MG Tabs tablet Generic drug: rivaroxaban TAKE 1 TABLET (20 MG TOTAL) BY MOUTH DAILY WITH SUPPER. What changed: See the new instructions.      Follow-up Information    [name], MD. Call in 1 day(s).   Specialty: Internal Medicine Contact information: [place] [address] [phone]        [name],  Ander Slade, MD .   Specialty: Cardiology Contact information: [place] [address] [phone]        [name], MD .   Specialty: Cardiology Contact information: [address] [place] [address] [phone]        Almyra Deforest, [place] Follow up.   Specialties: Cardiology, Radiology Why:  Almyra Deforest, PA-C 5/28 @2 :45 pm ([facility])   Contact information: [address] [place] [address] [phone]        [name], MD Follow up.   Specialty: Cardiothoracic Surgery Why: Your routine follow-up appointment is on 11/16/2019 at 10:30am. Please arrive at 10:00am for a chest xray located at [facility] which is on the first floor of our building.  Contact information: [place] [address] [phone]          The patient has been discharged on:   1.Beta Blocker:  Yes [ yes  ]                              No   [   ]                              If No, reason:  2.Ace Inhibitor/ARB: Yes [   ]                                     No  [ no   ]                                      If No, reason: normotensive  3.Statin:   Yes [place]   ]                  No  [   ]                  If No, reason:  4.Ecasa:  Yes  [ yes  ]                  No   [   ]                  If No, reason:     Signed: [name] 10/23/2019, 10:21 AM

## 2019-10-18 NOTE — Progress Notes (Signed)
ROUNDS NOTE :     [address]       [place] [address]             [phone]                 2 Days Post-Op Procedure(s) (LRB): AORTIC VALVE REPLACEMENT (AVR) using Edwards PERIMOUNT Magna Ease 25MM Bioprosthesis Aortic Valve. (N/A) SUPRA CORONARY REPLACEMENT OF ASCENDING AORTA using [place] Hemashiel Platinum 34 MM Vascular Graft. (N/A) MAZE with bilateral pulmonary vein isolation. (N/A) TRANSESOPHAGEAL ECHOCARDIOGRAM (TEE) (N/A) Clipping Of Atrial Appendage using AtriCure AtriClip Exclusion VLAA 56mm. (N/A)  Total Length of Stay:  LOS: 2 days  BP 122/88   Pulse 81   Temp 98.3 F (36.8 C) (Oral)   Resp (!) 21   Ht 6' (1.829 m)   Wt (!) 138.1 kg   SpO2 92%   BMI 41.29 kg/m   .Intake/Output      05/04 0701 - 05/05 0700 05/05 0701 - 05/06 0700   P.O. 900 220   I.V. (mL/kg) 328.1 (2.4) 3.7 (0)   Blood     IV Piggyback 249.9 22.1   Total Intake(mL/kg) 1478 (10.7) 245.8 (1.8)   Urine (mL/kg/hr) 2865 (0.9) 400 (0.4)   Blood     Chest Tube 200    Total Output 3065 400   Net -1587 -154.2          . sodium chloride       Lab Results  Component Value Date   WBC 13.6 (H) 10/18/2019   HGB 11.7 (L) 10/18/2019   HCT 36.5 (L) 10/18/2019   PLT 110 (L) 10/18/2019   GLUCOSE 135 (H) 10/18/2019   CHOL 148 05/29/2019   TRIG 255.0 (H) 05/29/2019   HDL 34.00 (L) 05/29/2019   LDLDIRECT 78.0 05/29/2019   LDLCALC 48 06/01/2018   ALT 26 10/12/2019   AST 20 10/12/2019   NA 136 10/18/2019   K 4.1 10/18/2019   CL 103 10/18/2019   CREATININE 0.96 10/18/2019   BUN 15 10/18/2019   CO2 22 10/18/2019   TSH 4.29 09/06/2019   PSA 4.75 (H) 05/29/2019   INR 1.4 (H) 10/16/2019   HGBA1C 6.3 (H) 10/12/2019   MICROALBUR 0.9 05/29/2019   Stable day, transfer to step down 4e   [name] MD  Beeper [phone] Office [phone] 10/18/2019 1:42 PM

## 2019-10-18 NOTE — Progress Notes (Signed)
TCTS Evening Rounds POD #2 s/p Wheat Stable day VSS AF Adequate uop Resting comfortably A/p: continue present management.  Z. Orvan Seen, [place]

## 2019-10-18 NOTE — Progress Notes (Signed)
Removed patient's right introducer with no issue.

## 2019-10-18 NOTE — Progress Notes (Addendum)
T[facility] DAILY ICU PROGRESS NOTE                   [address]            [place] [address]          [phone]   2 Days Post-Op Procedure(s) (LRB): AORTIC VALVE REPLACEMENT (AVR) using Edwards PERIMOUNT Magna Ease 25MM Bioprosthesis Aortic Valve. (N/A) SUPRA CORONARY REPLACEMENT OF ASCENDING AORTA using Maquet Hemashiel Platinum 34 MM Vascular Graft. (N/A) MAZE with bilateral pulmonary vein isolation. (N/A) TRANSESOPHAGEAL ECHOCARDIOGRAM (TEE) (N/A) Clipping Of Atrial Appendage using AtriCure AtriClip Exclusion VLAA 75mm. (N/A)  Total Length of Stay:  LOS: 2 days   Subjective: Diaphoretic this morning. This is the first time he has felt this way. Breathing slowly and deeply-feeling is passing.   Objective: Vital signs in last 24 hours: Temp:  [97.6 F (36.4 C)-98.4 F (36.9 C)] 97.6 F (36.4 C) (05/04 2300) Pulse Rate:  [68-93] 91 (05/05 0700) Cardiac Rhythm: Heart block (05/05 0000) Resp:  [13-22] 19 (05/05 0700) BP: (95-158)/(57-97) 118/75 (05/05 0700) SpO2:  [87 %-100 %] 93 % (05/05 0700) Arterial Line BP: (78-130)/(52-75) 78/75 (05/04 1000) Weight:  [138.1 kg] 138.1 kg (05/05 0630)  Filed Weights   10/16/19 0613 10/17/19 0640 10/18/19 0630  Weight: 134.7 kg (!) 139.6 kg (!) 138.1 kg    Weight change: -1.472 kg   Hemodynamic parameters for last 24 hours: PAP: (27-32)/(15-20) 32/20  Intake/Output from previous day: 05/04 0701 - 05/05 0700 In: [id number] [P.O.:900; I.V.:328.1; IV Piggyback:249.9] Out: 3065 [Urine:2865; Chest Tube:200]  Intake/Output this shift: No intake/output data recorded.  Current Meds: Scheduled Meds: . acetaminophen  1,000 mg Oral Q6H   Or  . acetaminophen (TYLENOL) oral liquid 160 mg/5 mL  1,000 mg Per Tube Q6H  . alfuzosin  10 mg Oral Q breakfast  . amiodarone  200 mg Oral Daily  . aspirin EC  325 mg Oral Daily   Or  . aspirin  324 mg Per Tube Daily  . atorvastatin  20 mg Oral Daily  . bisacodyl  10 mg Oral Daily   Or  . bisacodyl  10 mg Rectal Daily  . Chlorhexidine Gluconate Cloth  6 each Topical Daily  . docusate sodium  200 mg Oral Daily  . enoxaparin (LOVENOX) injection  40 mg Subcutaneous QHS  . insulin aspart  0-24 Units Subcutaneous Q4H  . insulin detemir  20 Units Subcutaneous Daily  . mouth rinse  15 mL Mouth Rinse BID  . metoprolol tartrate  12.5 mg Oral BID   Or  . metoprolol tartrate  12.5 mg Per Tube BID  . pantoprazole  40 mg Oral Daily  . revefenacin  175 mcg Inhalation Daily  . silver sulfADIAZINE  1 application Topical Daily  . sodium chloride flush  10-40 mL Intracatheter Q12H  . sodium chloride flush  3 mL Intravenous Q12H  . umeclidinium bromide  1 puff Inhalation Daily   Continuous Infusions: . sodium chloride Stopped (10/18/19 0630)  . sodium chloride    . sodium chloride Stopped (10/17/19 1900)  . cefUROXime (ZINACEF)  IV Stopped (10/17/19 2025)  . insulin Stopped (10/17/19 1145)  . lactated ringers    . lactated ringers Stopped (10/17/19 0740)  . lactated ringers Stopped (10/17/19 1145)  . nitroGLYCERIN Stopped (10/17/19 0740)  . norepinephrine (LEVOPHED) Adult infusion Stopped (10/16/19 1722)  . phenylephrine (NEO-SYNEPHRINE) Adult infusion Stopped (10/16/19 1741)   PRN Meds:.sodium chloride, dextrose, lactated ringers, metoprolol tartrate,  midazolam, morphine injection, ondansetron (ZOFRAN) IV, oxyCODONE, sodium chloride flush, sodium chloride flush, traMADol, triazolam  General appearance: alert, cooperative and no distress Heart: regular rate and rhythm, S1, S2 normal, no murmur, click, rub or gallop Lungs: clear to auscultation bilaterally Abdomen: soft, non-tender; bowel sounds normal; no masses,  no organomegaly Extremities: extremities normal, atraumatic, no cyanosis or edema Wound: clean and dry  Lab Results: CBC: Recent Labs    10/17/19 0420 10/17/19 1711  WBC 11.9* 14.4*  HGB 11.9* 12.6*  HCT 36.8* 39.5  PLT 121* PLATELET CLUMPS NOTED ON  SMEAR, UNABLE TO ESTIMATE   BMET:  Recent Labs    10/17/19 1711 10/18/19 0425  NA 135 136  K 4.3 4.1  CL 103 103  CO2 21* 22  GLUCOSE 185* 135*  BUN 15 15  CREATININE 1.19 0.96  CALCIUM 8.6* 8.4*    CMET: Lab Results  Component Value Date   WBC 14.4 (H) 10/17/2019   HGB 12.6 (L) 10/17/2019   HCT 39.5 10/17/2019   PLT PLATELET CLUMPS NOTED ON SMEAR, UNABLE TO ESTIMATE 10/17/2019   GLUCOSE 135 (H) 10/18/2019   CHOL 148 05/29/2019   TRIG 255.0 (H) 05/29/2019   HDL 34.00 (L) 05/29/2019   LDLDIRECT 78.0 05/29/2019   LDLCALC 48 06/01/2018   ALT 26 10/12/2019   AST 20 10/12/2019   NA 136 10/18/2019   K 4.1 10/18/2019   CL 103 10/18/2019   CREATININE 0.96 10/18/2019   BUN 15 10/18/2019   CO2 22 10/18/2019   TSH 4.29 09/06/2019   PSA 4.75 (H) 05/29/2019   INR 1.4 (H) 10/16/2019   HGBA1C 6.3 (H) 10/12/2019   MICROALBUR 0.9 05/29/2019      PT/INR:  Recent Labs    10/16/19 1607  LABPROT 16.7*  INR 1.4*   Radiology: No results found.   Assessment/Plan: S/P Procedure(s) (LRB): AORTIC VALVE REPLACEMENT (AVR) using Edwards PERIMOUNT Magna Ease 25MM Bioprosthesis Aortic Valve. (N/A) SUPRA CORONARY REPLACEMENT OF ASCENDING AORTA using Maquet Hemashiel Platinum 34 MM Vascular Graft. (N/A) MAZE with bilateral pulmonary vein isolation. (N/A) TRANSESOPHAGEAL ECHOCARDIOGRAM (TEE) (N/A) Clipping Of Atrial Appendage using AtriCure AtriClip Exclusion VLAA 30mm. (N/A)  1. CV-NSR in the 90s, BP well controlled. Not on any drips 2. Pulm-tolerating 1 L [place] with good oxygen support. Chest tube removed. CXR stable, slightly lower lung volumes on the left.  3. Renal-creatinine 0.96, electrolytes okay 4. Endo- blood glucose is well controlled 5. H and H is stable at 12.6/39.5, expected acute blood loss anemia  Plan: Discontinue foley catheter and central line. He already has a peripheral in place. Encouraged ambulation in the halls today and use of his incentive spirometer. Overall  doing well, may be able to go to the stepdown unit later today.     Clinton Black 10/18/2019 7:40 AM   Up in chair walked this am  D/c lines and foley If not more diaphoresis poss to step down later today  I have seen and examined Clinton Black and agree with the above assessment  and plan.  Clinton Isaac MD Beeper [phone] Office [phone] 10/18/2019 9:14 AM

## 2019-10-18 NOTE — Plan of Care (Signed)
  Problem: Education: ?Goal: Will demonstrate proper wound care and an understanding of methods to prevent future damage ?Outcome: Progressing ?Goal: Knowledge of disease or condition will improve ?Outcome: Progressing ?Goal: Knowledge of the prescribed therapeutic regimen will improve ?Outcome: Progressing ?  ?Problem: Activity: ?Goal: Risk for activity intolerance will decrease ?Outcome: Progressing ?  ?Problem: Cardiac: ?Goal: Will achieve and/or maintain hemodynamic stability ?Outcome: Progressing ?  ?Problem: Clinical Measurements: ?Goal: Postoperative complications will be avoided or minimized ?Outcome: Progressing ?  ?Problem: Respiratory: ?Goal: Respiratory status will improve ?Outcome: Progressing ?  ?Problem: Skin Integrity: ?Goal: Wound healing without signs and symptoms of infection ?Outcome: Progressing ?Goal: Risk for impaired skin integrity will decrease ?Outcome: Progressing ?  ?Problem: Urinary Elimination: ?Goal: Ability to achieve and maintain adequate renal perfusion and functioning will improve ?Outcome: Progressing ?  ?

## 2019-10-19 ENCOUNTER — Inpatient Hospital Stay (HOSPITAL_COMMUNITY): Payer: Medicare HMO

## 2019-10-19 LAB — BASIC METABOLIC PANEL
Anion gap: 10 (ref 5–15)
BUN: 22 mg/dL (ref 8–23)
CO2: 23 mmol/L (ref 22–32)
Calcium: 8.5 mg/dL — ABNORMAL LOW (ref 8.9–10.3)
Chloride: 103 mmol/L (ref 98–111)
Creatinine, Ser: 0.99 mg/dL (ref 0.61–1.24)
GFR calc Af Amer: >60 mL/min (ref >60)
GFR calc non Af Amer: >60 mL/min (ref >60)
Glucose, Bld: 124 mg/dL — ABNORMAL HIGH (ref 70–99)
Potassium: 3.7 mmol/L (ref 3.5–5.1)
Sodium: 136 mmol/L (ref 135–145)

## 2019-10-19 LAB — CBC
HCT: 34.5 % — ABNORMAL LOW (ref 39.0–52.0)
Hemoglobin: 10.9 g/dL — ABNORMAL LOW (ref 13.0–17.0)
MCH: 29.1 pg (ref 26.0–34.0)
MCHC: 31.6 g/dL (ref 30.0–36.0)
MCV: 92.2 fL (ref 80.0–100.0)
Platelets: 112 10*3/uL — ABNORMAL LOW (ref 150–400)
RBC: 3.74 MIL/uL — ABNORMAL LOW (ref 4.22–5.81)
RDW: 13.5 % (ref 11.5–15.5)
WBC: 13.9 10*3/uL — ABNORMAL HIGH (ref 4.0–10.5)
nRBC: 0 % (ref 0.0–0.2)

## 2019-10-19 LAB — GLUCOSE, CAPILLARY
Glucose-Capillary: 161 mg/dL — ABNORMAL HIGH (ref 70–99)
Glucose-Capillary: 139 mg/dL — ABNORMAL HIGH (ref 70–99)
Glucose-Capillary: 149 mg/dL — ABNORMAL HIGH (ref 70–99)

## 2019-10-19 IMAGING — DX DG CHEST 2V
2 series · 2 of 2 positions shown · non-contrast
Comparison: Chest x-ray [DATE].

CLINICAL DATA: 73-year-old male under postoperative evaluation.

EXAM:
CHEST - 2 VIEW

[chest pa]
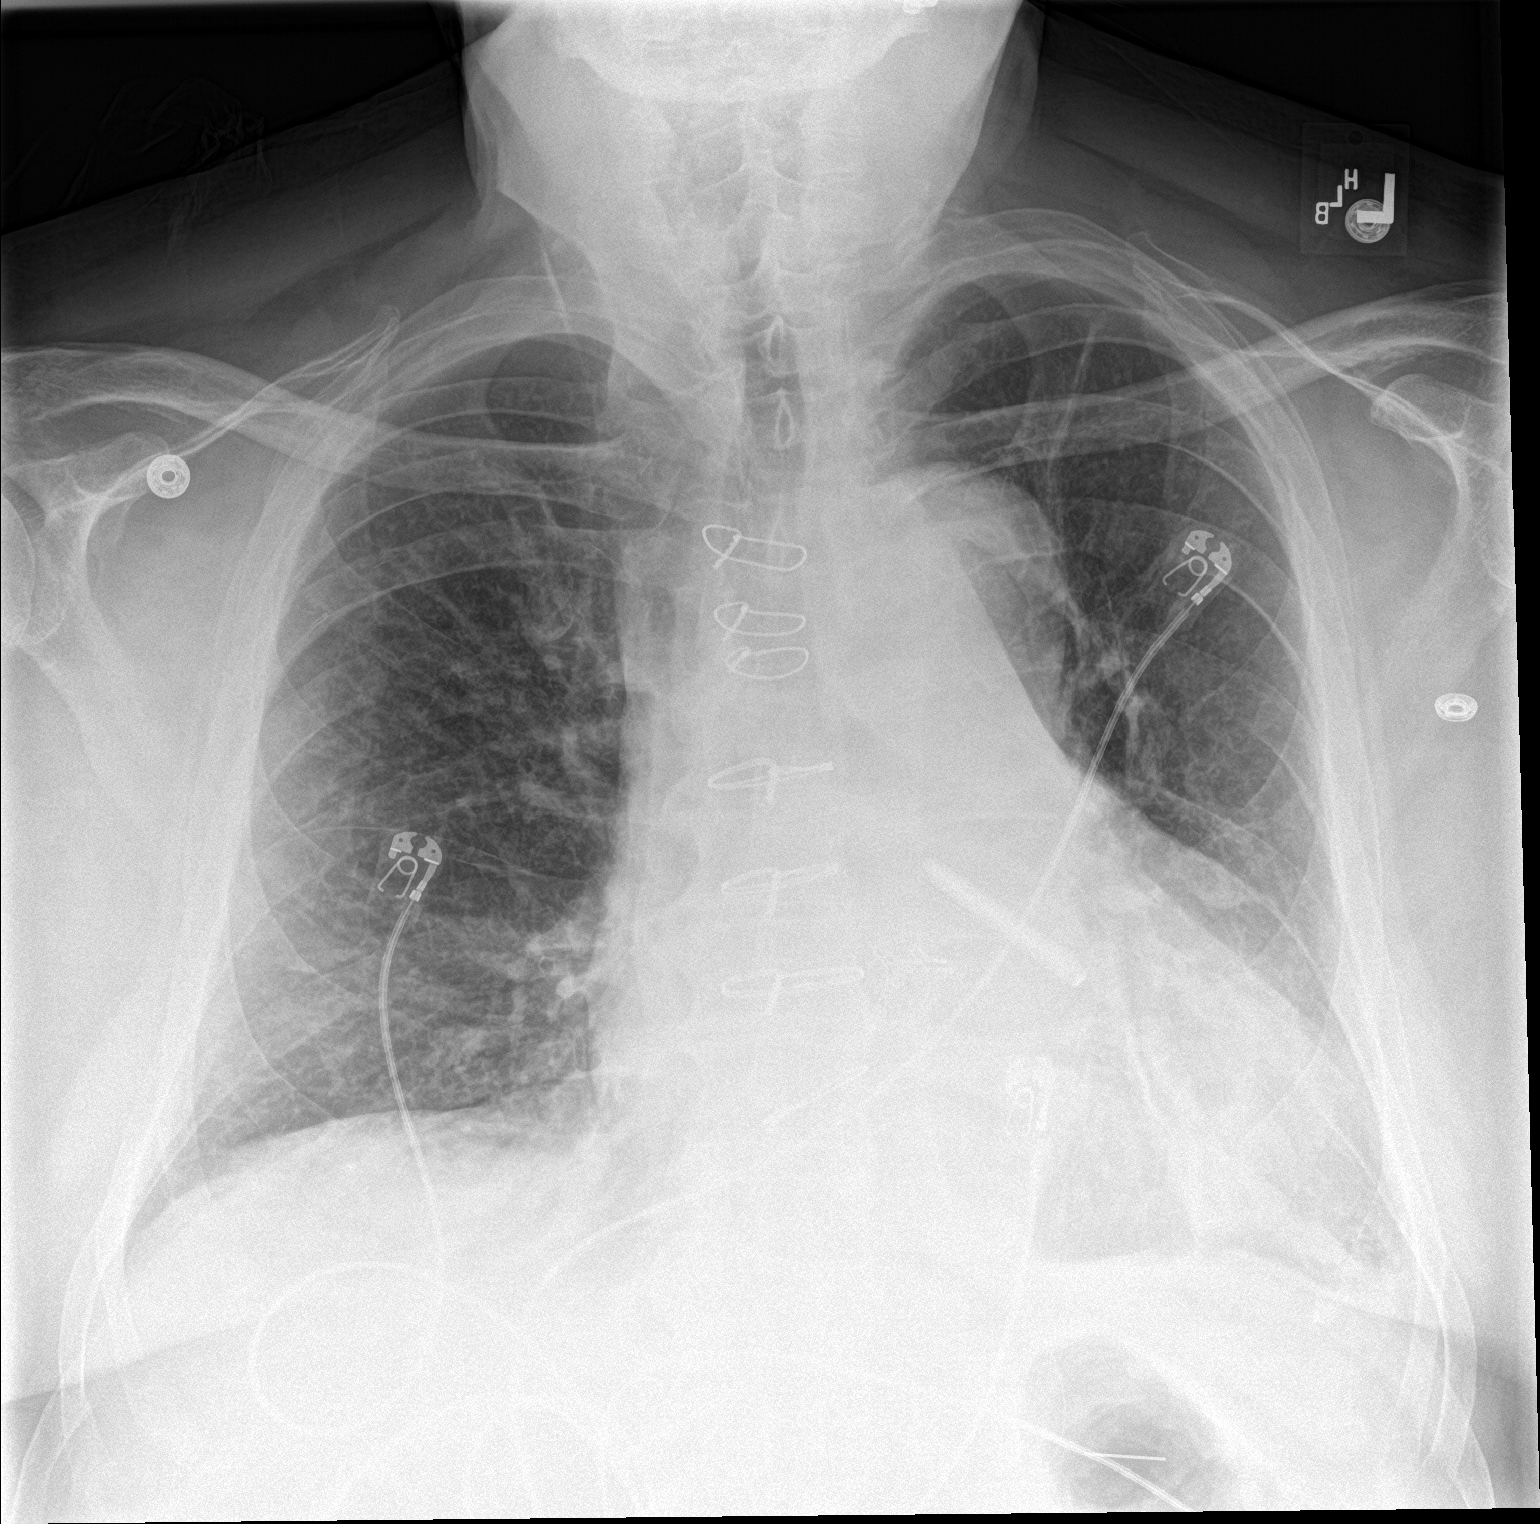

[chest lat]
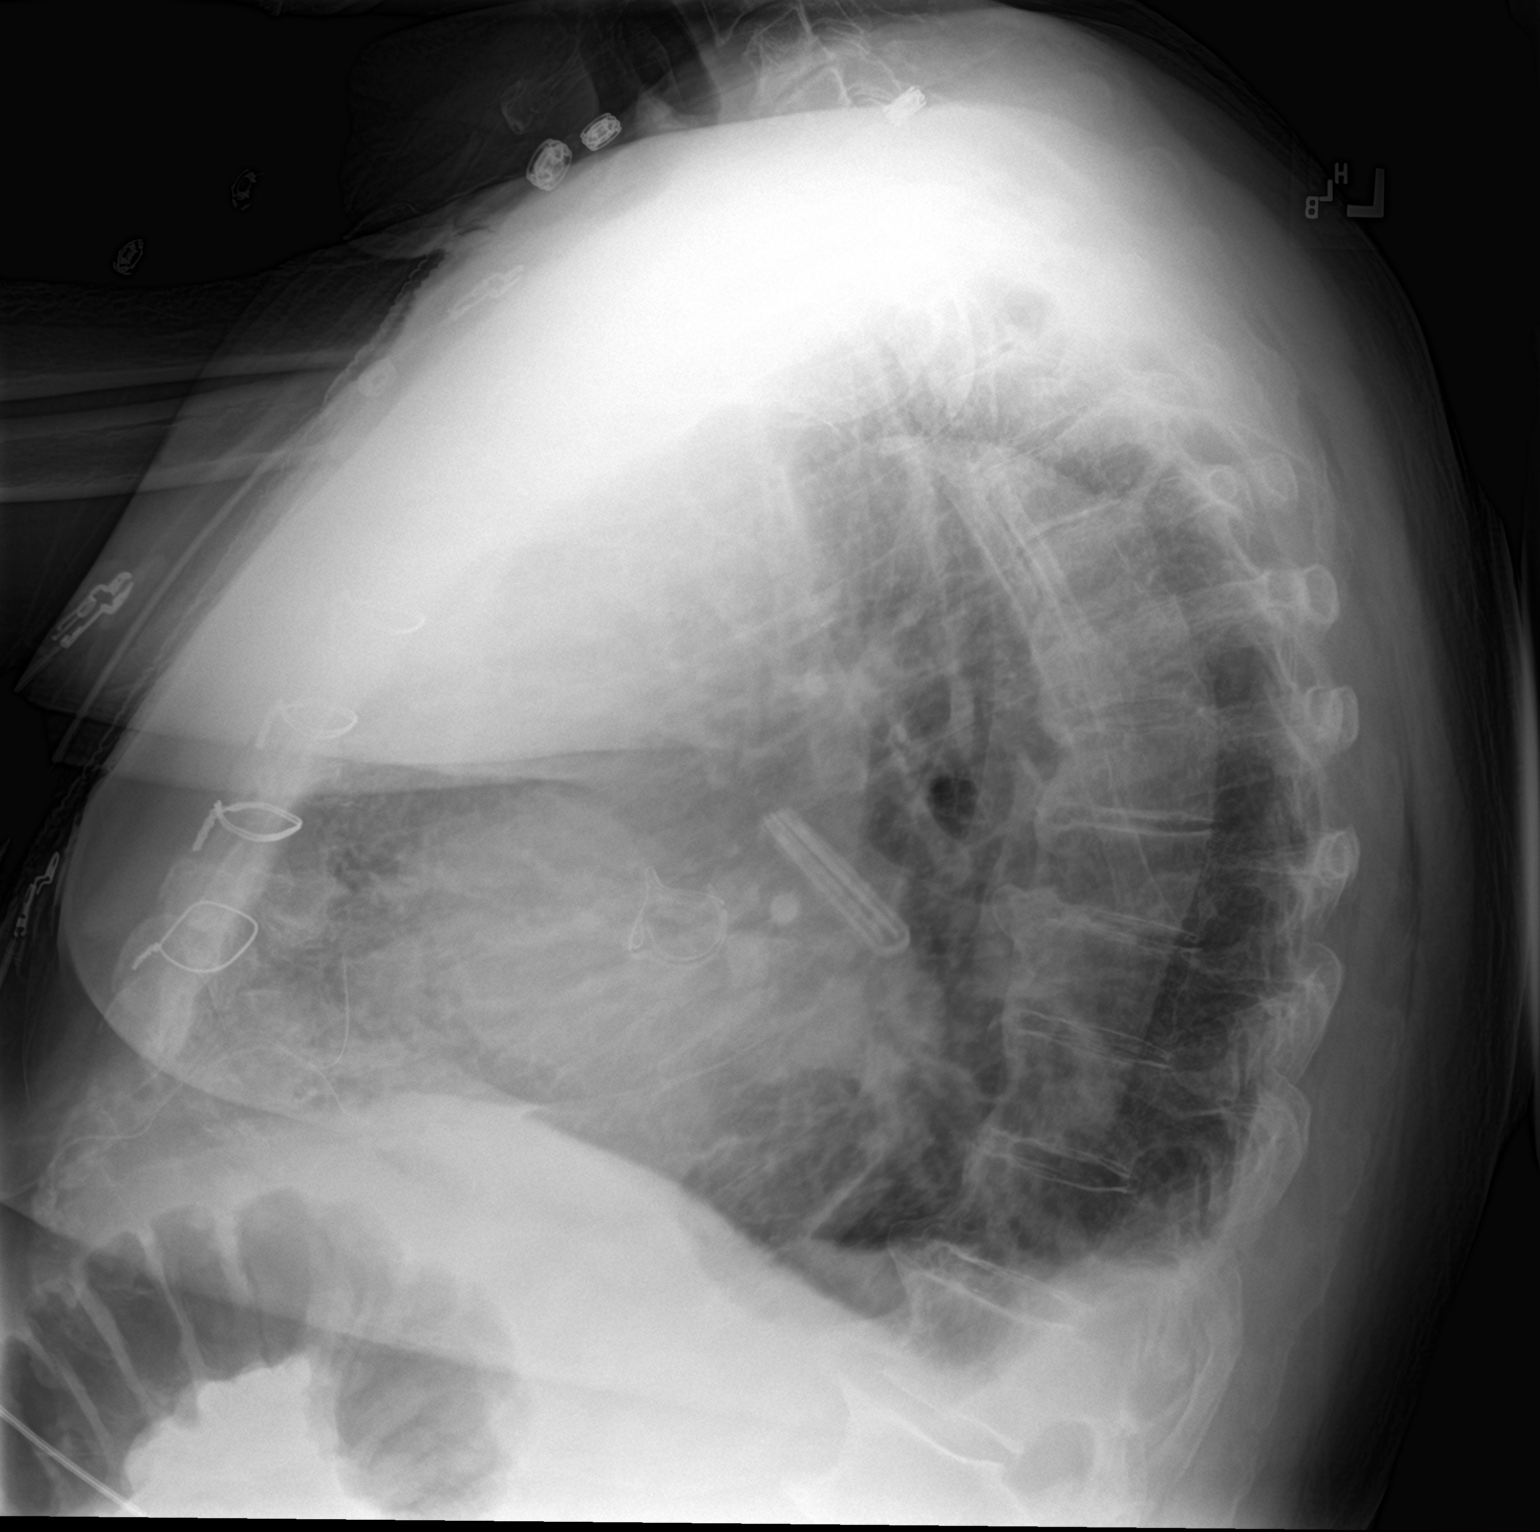

[2 of 2 positions shown; findings below may reference images not displayed]

FINDINGS: Previously noted right IJ Cordis has been removed. Lung volumes
remain slightly low, but have improved compared to the prior study.
Linear opacities in the left mid to lower lung, presumably resolving
postoperative subsegmental atelectasis. Trace left pleural effusion.
No appreciable pneumothorax. No evidence of pulmonary edema. Heart
size is normal. Upper mediastinal contours are distorted by
patient's positioning. Aortic atherosclerosis. Status post median
sternotomy for aortic valve replacement with a stented
bioprosthesis. Left atrial appendage ligation clip also noted.
IMPRESSION: 1. Postoperative changes and support apparatus, as above.
2. Improving lung volumes with resolving left lower lobe
subsegmental atelectasis and trace left pleural effusion.
3. Aortic atherosclerosis.

## 2019-10-19 MED ORDER — FUROSEMIDE 40 MG PO TABS
40.0000 mg | ORAL_TABLET | Freq: Every day | ORAL | Status: DC
  Administered 2019-10-19 – 2019-10-23 (×5): 40 mg via ORAL
  Filled 2019-10-19 (×5): qty 1

## 2019-10-19 MED ORDER — INSULIN DETEMIR 100 UNIT/ML ~~LOC~~ SOLN
23.0000 [IU] | Freq: Every day | SUBCUTANEOUS | Status: DC
  Administered 2019-10-19 – 2019-10-23 (×5): 23 [IU] via SUBCUTANEOUS
  Filled 2019-10-19 (×5): qty 0.23

## 2019-10-19 MED ORDER — GUAIFENESIN ER 600 MG PO TB12
1200.0000 mg | ORAL_TABLET | Freq: Two times a day (BID) | ORAL | Status: DC
  Administered 2019-10-19 – 2019-10-23 (×9): 1200 mg via ORAL
  Filled 2019-10-19 (×9): qty 2

## 2019-10-19 MED ORDER — POTASSIUM CHLORIDE CRYS ER 20 MEQ PO TBCR
20.0000 meq | EXTENDED_RELEASE_TABLET | Freq: Every day | ORAL | Status: DC
  Administered 2019-10-19 – 2019-10-23 (×5): 20 meq via ORAL
  Filled 2019-10-19 (×5): qty 1

## 2019-10-19 NOTE — Progress Notes (Signed)
CARDIAC REHAB PHASE I   PRE:  Rate/Rhythm: 97 SR  BP:  Supine: 107/79  Sitting:   Standing:    SaO2: 96% 2L   98% RA  MODE:  Ambulation: 300 ft   POST:  Rate/Rhythm: 105 ?afib,  Back to SR 90's  BP:  Supine:   Sitting: 131/84  Standing:    SaO2: 99%RA 1100-1135 Checked with pt an hour ago to walk and he requested I come back. Now pt walked 300 ft on RA with rolling walker and asst x 1. Rocked to stand. Sats good on RA. To recliner with call bell. DOE noted. Encouraged IS and flutter valve which pt stated he has been using. Also pt stated this was second walk today.  Left off oxygen.   Graylon Good, RN BSN  10/19/2019 11:28 AM

## 2019-10-19 NOTE — Progress Notes (Addendum)
[address]       [place] [address]             [phone]      3 Days Post-Op Procedure(s) (LRB): AORTIC VALVE REPLACEMENT (AVR) using Edwards PERIMOUNT Magna Ease 25MM Bioprosthesis Aortic Valve. (N/A) SUPRA CORONARY REPLACEMENT OF ASCENDING AORTA using Maquet Hemashiel Platinum 34 MM Vascular Graft. (N/A) MAZE with bilateral pulmonary vein isolation. (N/A) TRANSESOPHAGEAL ECHOCARDIOGRAM (TEE) (N/A) Clipping Of Atrial Appendage using AtriCure AtriClip Exclusion VLAA 23mm. (N/A) Subjective: Feels okay this morning. Trying to order breakfast.   Objective: Vital signs in last 24 hours: Temp:  [98.2 F (36.8 C)-99.5 F (37.5 C)] 98.5 F (36.9 C) (05/06 0459) Pulse Rate:  [81-100] 93 (05/06 0500) Cardiac Rhythm: Atrial fibrillation (05/05 2158) Resp:  [15-23] 19 (05/06 0500) BP: (93-139)/(61-99) 108/75 (05/06 0459) SpO2:  [91 %-100 %] 97 % (05/06 0500) Weight:  [137.3 kg-138.3 kg] 137.3 kg (05/06 0658)    Intake/Output from previous day: 05/05 0701 - 05/06 0700 In: 368.8 [P.O.:340; I.V.:6.7; IV Piggyback:22.1] Out: 625 [Urine:625] Intake/Output this shift: No intake/output data recorded.  General appearance: alert, cooperative and no distress Heart: regular rate and rhythm, S1, S2 normal, no murmur, click, rub or gallop Lungs: clear to auscultation bilaterally Abdomen: soft, non-tender; bowel sounds normal; no masses,  no organomegaly Extremities: extremities normal, atraumatic, no cyanosis or edema Wound: clean and dry  Lab Results: Recent Labs    10/18/19 0425 10/19/19 0219  WBC 13.6* 13.9*  HGB 11.7* 10.9*  HCT 36.5* 34.5*  PLT 110* 112*   BMET:  Recent Labs    10/18/19 0425 10/19/19 0219  NA 136 136  K 4.1 3.7  CL 103 103  CO2 22 23  GLUCOSE 135* 124*  BUN 15 22  CREATININE 0.96 0.99  CALCIUM 8.4* 8.5*    PT/INR:  Recent Labs    10/16/19 1607  LABPROT 16.7*  INR 1.4*   ABG    Component Value Date/Time   PHART  7.407 10/16/2019 1948   HCO3 21.9 10/16/2019 1948   TCO2 23 10/16/2019 1948   ACIDBASEDEF 2.0 10/16/2019 1948   O2SAT 96.0 10/16/2019 1948   CBG (last 3)  Recent Labs    10/18/19 1459 10/18/19 1944 10/19/19 0644  GLUCAP 201* 216* 161*    Assessment/Plan: S/P Procedure(s) (LRB): AORTIC VALVE REPLACEMENT (AVR) using Edwards PERIMOUNT Magna Ease 25MM Bioprosthesis Aortic Valve. (N/A) SUPRA CORONARY REPLACEMENT OF ASCENDING AORTA using Maquet Hemashiel Platinum 34 MM Vascular Graft. (N/A) MAZE with bilateral pulmonary vein isolation. (N/A) TRANSESOPHAGEAL ECHOCARDIOGRAM (TEE) (N/A) Clipping Of Atrial Appendage using AtriCure AtriClip Exclusion VLAA 84mm. (N/A)  1. CV-NSR in the 90s, BP well controlled. Continue ASA, statin, BB, and Amio 200mg  daily.  2. Pulm-tolerating 2 L [place] with good oxygen support. Chest tube removed. CXR stable, improved aeration on the left 3. Renal-creatinine 0.99, electrolytes okay 4. Endo- last couple of blood glucose readings were high-around 200. Will increase Levemir for better control.  5. H and H is stable at 10.9/34.5, expected acute blood loss anemia. Will continue to trend.   Plan:  Condom cath in place. Continue medical care. Ambulate TID. Encouraged use of incentive spirometer. Remaining in NSR. Continue diuresis.    LOS: 3 days    Clinton Black 10/19/2019  Chest xay improved No blood transfusion postop or intra op Expected Acute  Blood - loss Anemia- continue to monitor  Holding sinus  I have seen and examined Clinton Black  [name] and agree with the above assessment  and plan.  Clinton Isaac MD Beeper [phone] Office [phone] 10/19/2019 8:14 AM

## 2019-10-20 ENCOUNTER — Encounter (HOSPITAL_COMMUNITY): Payer: Self-pay | Admitting: Cardiothoracic Surgery

## 2019-10-20 LAB — GLUCOSE, CAPILLARY
Glucose-Capillary: 108 mg/dL — ABNORMAL HIGH (ref 70–99)
Glucose-Capillary: 136 mg/dL — ABNORMAL HIGH (ref 70–99)
Glucose-Capillary: 138 mg/dL — ABNORMAL HIGH (ref 70–99)
Glucose-Capillary: 155 mg/dL — ABNORMAL HIGH (ref 70–99)
Glucose-Capillary: 157 mg/dL — ABNORMAL HIGH (ref 70–99)
Glucose-Capillary: 161 mg/dL — ABNORMAL HIGH (ref 70–99)
Glucose-Capillary: 190 mg/dL — ABNORMAL HIGH (ref 70–99)

## 2019-10-20 NOTE — Progress Notes (Signed)
[address]       [place] [address]             [phone]      4 Days Post-Op Procedure(s) (LRB): AORTIC VALVE REPLACEMENT (AVR) using Edwards PERIMOUNT Magna Ease 25MM Bioprosthesis Aortic Valve. (N/A) SUPRA CORONARY REPLACEMENT OF ASCENDING AORTA using Maquet Hemashiel Platinum 34 MM Vascular Graft. (N/A) MAZE with bilateral pulmonary vein isolation. (N/A) TRANSESOPHAGEAL ECHOCARDIOGRAM (TEE) (N/A) Clipping Of Atrial Appendage using AtriCure AtriClip Exclusion VLAA 30mm. (N/A) Subjective: Feels ok, no BM but passing flatus Had short burst afib , short burst Vtach, appears to be in junctional rhythm  Objective: Vital signs in last 24 hours: Temp:  [98.1 F (36.7 C)-98.7 F (37.1 C)] 98.4 F (36.9 C) (05/07 0730) Pulse Rate:  [71-96] 84 (05/07 0730) Cardiac Rhythm: Heart block (05/07 0812) Resp:  [15-20] 19 (05/07 0730) BP: (98-145)/(58-97) 118/93 (05/07 0730) SpO2:  [94 %-98 %] 96 % (05/07 0730) Weight:  [135.9 kg] 135.9 kg (05/07 0313)  Hemodynamic parameters for last 24 hours:    Intake/Output from previous day: 05/06 0701 - 05/07 0700 In: -  Out: 1625 [Urine:1625] Intake/Output this shift: No intake/output data recorded.  General appearance: alert, cooperative and no distress Heart: regular rate and rhythm Lungs: clear to auscultation bilaterally Abdomen: benign Extremities: trace edema Wound: incis healing well, minor sternal drainage lower pole, serosang without purulence or erethema  Lab Results: Recent Labs    10/18/19 0425 10/19/19 0219  WBC 13.6* 13.9*  HGB 11.7* 10.9*  HCT 36.5* 34.5*  PLT 110* 112*   BMET:  Recent Labs    10/18/19 0425 10/19/19 0219  NA 136 136  K 4.1 3.7  CL 103 103  CO2 22 23  GLUCOSE 135* 124*  BUN 15 22  CREATININE 0.96 0.99  CALCIUM 8.4* 8.5*    PT/INR: No results for input(s): LABPROT, INR in the last 72 hours. ABG    Component Value Date/Time   PHART 7.407 10/16/2019 1948   HCO3  21.9 10/16/2019 1948   TCO2 23 10/16/2019 1948   ACIDBASEDEF 2.0 10/16/2019 1948   O2SAT 96.0 10/16/2019 1948   CBG (last 3)  Recent Labs    10/20/19 0002 10/20/19 0318 10/20/19 0818  GLUCAP 161* 136* 108*    Meds Scheduled Meds: . alfuzosin  10 mg Oral Q breakfast  . amiodarone  200 mg Oral Daily  . aspirin EC  325 mg Oral Daily   Or  . aspirin  324 mg Per Tube Daily  . atorvastatin  20 mg Oral Daily  . Chlorhexidine Gluconate Cloth  6 each Topical Daily  . docusate sodium  200 mg Oral Daily  . enoxaparin (LOVENOX) injection  40 mg Subcutaneous QHS  . furosemide  40 mg Oral Daily  . guaiFENesin  1,200 mg Oral BID  . insulin aspart  0-24 Units Subcutaneous TID AC & HS  . insulin detemir  23 Units Subcutaneous Daily  . mouth rinse  15 mL Mouth Rinse BID  . metoprolol tartrate  12.5 mg Oral BID   Or  . metoprolol tartrate  12.5 mg Per Tube BID  . pantoprazole  40 mg Oral QAC breakfast  . potassium chloride  20 mEq Oral Daily  . revefenacin  175 mcg Inhalation Daily  . silver sulfADIAZINE  1 application Topical BID  . sodium chloride flush  3 mL Intravenous Q12H  . umeclidinium bromide  1 puff Inhalation Daily   Continuous  Infusions: . sodium chloride     PRN Meds:.sodium chloride, acetaminophen, bisacodyl **OR** bisacodyl, magnesium hydroxide, ondansetron **OR** ondansetron (ZOFRAN) IV, oxyCODONE, sodium chloride flush, traMADol, triazolam  Xrays DG Chest 2 View  Result Date: 10/19/2019 CLINICAL DATA:  74 year old male under postoperative evaluation. EXAM: CHEST - 2 VIEW COMPARISON:  Chest x-ray 10/18/2019. FINDINGS: Previously noted right IJ Cordis has been removed. Lung volumes remain slightly low, but have improved compared to the prior study. Linear opacities in the left mid to lower lung, presumably resolving postoperative subsegmental atelectasis. Trace left pleural effusion. No appreciable pneumothorax. No evidence of pulmonary edema. Heart size is normal. Upper  mediastinal contours are distorted by patient's positioning. Aortic atherosclerosis. Status post median sternotomy for aortic valve replacement with a stented bioprosthesis. Left atrial appendage ligation clip also noted. IMPRESSION: 1. Postoperative changes and support apparatus, as above. 2. Improving lung volumes with resolving left lower lobe subsegmental atelectasis and trace left pleural effusion. 3. Aortic atherosclerosis. Electronically Signed   By: Vinnie Langton M.D.   On: 10/19/2019 08:15    Assessment/Plan: S/P Procedure(s) (LRB): AORTIC VALVE REPLACEMENT (AVR) using Edwards PERIMOUNT Magna Ease 25MM Bioprosthesis Aortic Valve. (N/A) SUPRA CORONARY REPLACEMENT OF ASCENDING AORTA using Maquet Hemashiel Platinum 34 MM Vascular Graft. (N/A) MAZE with bilateral pulmonary vein isolation. (N/A) TRANSESOPHAGEAL ECHOCARDIOGRAM (TEE) (N/A) Clipping Of Atrial Appendage using AtriCure AtriClip Exclusion VLAA 72mm. (N/A)  1 overall conts to do well  2 VSS , some variability in SBP 90's to 140's- , mostly well controlled 3 sats good on RA 4 junct rhythm, some afib, short burst of Vtach- cont amio and beta blocker at current dose for now 5 weight improving with diuresis, cont for now, normal renal fxn yesterday , will get BMET in am 6 BS improved control- resume home meds at discharge- not eating that well yet 7 some constpation- having a lot of flatus- cont MOM, usual meds 8 cont to push rehab and pulm toilet    LOS: 4 days    John Giovanni PA-C Pager [phone]  10/20/2019

## 2019-10-20 NOTE — Progress Notes (Signed)
CARDIAC REHAB PHASE I   PRE:  Rate/Rhythm: 81 SR  BP:  Supine: 132/82  Sitting:   Standing:    SaO2: 98%RA  MODE:  Ambulation: 350 ft   POST:  Rate/Rhythm: 98 SR  BP:  Supine:   Sitting: 141/80  Standing:    SaO2: 99%RA 0827-0900 Pt walked 350 ft on RA with rolling walker. Reinforced sternal precautions. Pt took a couple of standing rest breaks and I encouraged pursed lip breathing as he appeared SOB. Sats good on RA. To sitting on side of bed for breakfast. Will follow up later to ed.    Graylon Good, RN BSN  10/20/2019 9:25 AM

## 2019-10-20 NOTE — Progress Notes (Signed)
Patient ambulated about 250 ft. at bedtime. No acute distress noted. Woke up this morning with some confusion and unable to determine his whereabout. Patient was  Reoriented by this RN and he came to his baseline and was apologetic for his confusion. No acute distress noted. Requested for Tylenol 650 mg oral PRN this morning for a pain on his right leg. Resting quietly at his time with his eyes closed, respirations even and unlabored. Will continue to monitor.

## 2019-10-21 DIAGNOSIS — Z95828 Presence of other vascular implants and grafts: Secondary | ICD-10-CM

## 2019-10-21 DIAGNOSIS — Z952 Presence of prosthetic heart valve: Secondary | ICD-10-CM

## 2019-10-21 LAB — GLUCOSE, CAPILLARY
Glucose-Capillary: 104 mg/dL — ABNORMAL HIGH (ref 70–99)
Glucose-Capillary: 98 mg/dL (ref 70–99)
Glucose-Capillary: 216 mg/dL — ABNORMAL HIGH (ref 70–99)
Glucose-Capillary: 150 mg/dL — ABNORMAL HIGH (ref 70–99)
Glucose-Capillary: 158 mg/dL — ABNORMAL HIGH (ref 70–99)

## 2019-10-21 LAB — BASIC METABOLIC PANEL WITH GFR
Anion gap: 11 (ref 5–15)
BUN: 26 mg/dL — ABNORMAL HIGH (ref 8–23)
CO2: 25 mmol/L (ref 22–32)
Calcium: 8.3 mg/dL — ABNORMAL LOW (ref 8.9–10.3)
Chloride: 102 mmol/L (ref 98–111)
Creatinine, Ser: 0.9 mg/dL (ref 0.61–1.24)
GFR calc Af Amer: >60 mL/min (ref >60)
GFR calc non Af Amer: >60 mL/min (ref >60)
Glucose, Bld: 103 mg/dL — ABNORMAL HIGH (ref 70–99)
Potassium: 3.4 mmol/L — ABNORMAL LOW (ref 3.5–5.1)
Sodium: 138 mmol/L (ref 135–145)

## 2019-10-21 MED ORDER — LACTULOSE 10 GM/15ML PO SOLN
30.0000 g | Freq: Every day | ORAL | Status: DC | PRN
  Administered 2019-10-22: 30 g via ORAL
  Filled 2019-10-21: qty 45

## 2019-10-21 NOTE — Progress Notes (Signed)
CARDIAC REHAB PHASE I   PRE:  Rate/Rhythm: 87 SR  BP:  Supine:   Sitting: 129/84  Standing:    SaO2: 93  MODE:  Ambulation: 300 ft   POST:  Rate/Rhythm: 93 SR  BP:  Supine:   Sitting: To BSC, unable to get  Standing:    SaO2:  Feels "bad" today, tolerated ambulation well with rolling walker and assistance x1.  C/o no BM yet, passing gas while walking in the hall.  Education completed re: sternal precautions, activity restrictions, exercise progression, referred to phase II cardiac rehab in [facility].   1000-1100  Liliane Channel RN, BSN 10/21/2019 11:03 AM

## 2019-10-21 NOTE — Progress Notes (Addendum)
[address]       [place] [address]             [phone]      5 Days Post-Op Procedure(s) (LRB): AORTIC VALVE REPLACEMENT (AVR) using Edwards PERIMOUNT Magna Ease 25MM Bioprosthesis Aortic Valve. (N/A) SUPRA CORONARY REPLACEMENT OF ASCENDING AORTA using Maquet Hemashiel Platinum 34 MM Vascular Graft. (N/A) MAZE with bilateral pulmonary vein isolation. (N/A) TRANSESOPHAGEAL ECHOCARDIOGRAM (TEE) (N/A) Clipping Of Atrial Appendage using AtriCure AtriClip Exclusion VLAA 54mm. (N/A) Subjective: Feels ok, no BM, some incisional pain  Objective: Vital signs in last 24 hours: Temp:  [97.5 F (36.4 C)-98.7 F (37.1 C)] 98.3 F (36.8 C) (05/08 0516) Pulse Rate:  [76-97] 76 (05/08 0516) Cardiac Rhythm: Normal sinus rhythm;Heart block (05/07 1900) Resp:  [19-23] 20 (05/08 0516) BP: (100-144)/(67-80) 144/80 (05/08 0516) SpO2:  [94 %-99 %] 99 % (05/08 0827) Weight:  [138.5 kg] 138.5 kg (05/08 0508)  Hemodynamic parameters for last 24 hours:    Intake/Output from previous day: 05/07 0701 - 05/08 0700 In: 360 [P.O.:360] Out: 1800 [Urine:1800] Intake/Output this shift: No intake/output data recorded.  General appearance: alert, cooperative and no distress Heart: regular rate and rhythm Lungs: clear to auscultation bilaterally Abdomen: soft, nontender, nondistendedm + BS Extremities: no edema Wound: incis healing well  Lab Results: Recent Labs    10/19/19 0219  WBC 13.9*  HGB 10.9*  HCT 34.5*  PLT 112*   BMET:  Recent Labs    10/19/19 0219 10/21/19 0351  NA 136 138  K 3.7 3.4*  CL 103 102  CO2 23 25  GLUCOSE 124* 103*  BUN 22 26*  CREATININE 0.99 0.90  CALCIUM 8.5* 8.3*    PT/INR: No results for input(s): LABPROT, INR in the last 72 hours. ABG    Component Value Date/Time   PHART 7.407 10/16/2019 1948   HCO3 21.9 10/16/2019 1948   TCO2 23 10/16/2019 1948   ACIDBASEDEF 2.0 10/16/2019 1948   O2SAT 96.0 10/16/2019 1948   CBG (last  3)  Recent Labs    10/20/19 1617 10/20/19 2058 10/21/19 0755  GLUCAP 138* 157* 98    Meds Scheduled Meds: . alfuzosin  10 mg Oral Q breakfast  . amiodarone  200 mg Oral Daily  . aspirin EC  325 mg Oral Daily   Or  . aspirin  324 mg Per Tube Daily  . atorvastatin  20 mg Oral Daily  . Chlorhexidine Gluconate Cloth  6 each Topical Daily  . docusate sodium  200 mg Oral Daily  . enoxaparin (LOVENOX) injection  40 mg Subcutaneous QHS  . furosemide  40 mg Oral Daily  . guaiFENesin  1,200 mg Oral BID  . insulin aspart  0-24 Units Subcutaneous TID AC & HS  . insulin detemir  23 Units Subcutaneous Daily  . mouth rinse  15 mL Mouth Rinse BID  . metoprolol tartrate  12.5 mg Oral BID   Or  . metoprolol tartrate  12.5 mg Per Tube BID  . pantoprazole  40 mg Oral QAC breakfast  . potassium chloride  20 mEq Oral Daily  . revefenacin  175 mcg Inhalation Daily  . silver sulfADIAZINE  1 application Topical BID  . sodium chloride flush  3 mL Intravenous Q12H  . umeclidinium bromide  1 puff Inhalation Daily   Continuous Infusions: . sodium chloride     PRN Meds:.sodium chloride, acetaminophen, bisacodyl **OR** bisacodyl, magnesium hydroxide, ondansetron **OR** ondansetron (ZOFRAN) IV, oxyCODONE, sodium  chloride flush, traMADol, triazolam  Xrays No results found.  Assessment/Plan: S/P Procedure(s) (LRB): AORTIC VALVE REPLACEMENT (AVR) using Edwards PERIMOUNT Magna Ease 25MM Bioprosthesis Aortic Valve. (N/A) SUPRA CORONARY REPLACEMENT OF ASCENDING AORTA using Maquet Hemashiel Platinum 34 MM Vascular Graft. (N/A) MAZE with bilateral pulmonary vein isolation. (N/A) TRANSESOPHAGEAL ECHOCARDIOGRAM (TEE) (N/A) Clipping Of Atrial Appendage using AtriCure AtriClip Exclusion VLAA 22mm. (N/A)  1 conts to progress well overall 2 stable hemodynamics in sinus rhythm 3 sats ok on RA 4 junct rhythm, stable will d/c pacer wires today 5 normal renal fxn, should be able to d/c lasix soon , adeq UOP 6  will try lactulose for constipation 7 push ambulation and routine pulm toilet 8 poss home in 1-2 days  LOS: 5 days    John Giovanni PA-C Pager [phone] 10/21/2019  Poss home 1-2 days sinus rhythm now Still no bm Resume xarelto at d/c I have seen and examined Clinton Black and agree with the above assessment  and plan.  [name] MD Beeper [phone] Office [phone] 10/21/2019 1:38 PM

## 2019-10-21 NOTE — Plan of Care (Signed)
  Problem: Education: Goal: Knowledge of General Education information will improve Description Including pain rating scale, medication(s)/side effects and non-pharmacologic comfort measures Outcome: Progressing   

## 2019-10-22 LAB — GLUCOSE, CAPILLARY
Glucose-Capillary: 105 mg/dL — ABNORMAL HIGH (ref 70–99)
Glucose-Capillary: 118 mg/dL — ABNORMAL HIGH (ref 70–99)
Glucose-Capillary: 141 mg/dL — ABNORMAL HIGH (ref 70–99)
Glucose-Capillary: 176 mg/dL — ABNORMAL HIGH (ref 70–99)

## 2019-10-22 NOTE — Progress Notes (Addendum)
[address]       [place] [address]             [phone]      6 Days Post-Op Procedure(s) (LRB): AORTIC VALVE REPLACEMENT (AVR) using Edwards PERIMOUNT Magna Ease 25MM Bioprosthesis Aortic Valve. (N/A) SUPRA CORONARY REPLACEMENT OF ASCENDING AORTA using Maquet Hemashiel Platinum 34 MM Vascular Graft. (N/A) MAZE with bilateral pulmonary vein isolation. (N/A) TRANSESOPHAGEAL ECHOCARDIOGRAM (TEE) (N/A) Clipping Of Atrial Appendage using AtriCure AtriClip Exclusion VLAA 31mm. (N/A) Subjective: Feeling better, slowly getting stronger, ambulated 3 times. No BM but didn't get lactulose- will get this am Wires removed this am  Objective: Vital signs in last 24 hours: Temp:  [97.6 F (36.4 C)-98.6 F (37 C)] 98.1 F (36.7 C) (05/09 0833) Pulse Rate:  [80-91] 82 (05/09 0833) Cardiac Rhythm: Heart block (05/08 2000) Resp:  [12-27] 15 (05/09 0833) BP: (107-129)/(72-84) 119/83 (05/09 0833) SpO2:  [95 %-100 %] 96 % (05/09 0833) Weight:  [137.9 kg] 137.9 kg (05/09 0405)  Hemodynamic parameters for last 24 hours:    Intake/Output from previous day: 05/08 0701 - 05/09 0700 In: 960 [P.O.:960] Out: 1600 [Urine:1600] Intake/Output this shift: No intake/output data recorded.  General appearance: alert, cooperative and no distress Heart: regular rate and rhythm Lungs: min dim in bases Abdomen: no signif distension, + BS, obese, non-tender Extremities: no edema Wound: incis healing well, some serous drainage from lower pole of sternal incis persists  Lab Results: No results for input(s): WBC, HGB, HCT, PLT in the last 72 hours. BMET:  Recent Labs    10/21/19 0351  NA 138  K 3.4*  CL 102  CO2 25  GLUCOSE 103*  BUN 26*  CREATININE 0.90  CALCIUM 8.3*    PT/INR: No results for input(s): LABPROT, INR in the last 72 hours. ABG    Component Value Date/Time   PHART 7.407 10/16/2019 1948   HCO3 21.9 10/16/2019 1948   TCO2 23 10/16/2019 1948   ACIDBASEDEF 2.0 10/16/2019 1948   O2SAT 96.0 10/16/2019 1948   CBG (last 3)  Recent Labs    10/21/19 1610 10/21/19 2111 10/22/19 0611  GLUCAP 150* 158* 105*    Meds Scheduled Meds: . alfuzosin  10 mg Oral Q breakfast  . amiodarone  200 mg Oral Daily  . aspirin EC  325 mg Oral Daily   Or  . aspirin  324 mg Per Tube Daily  . atorvastatin  20 mg Oral Daily  . Chlorhexidine Gluconate Cloth  6 each Topical Daily  . docusate sodium  200 mg Oral Daily  . enoxaparin (LOVENOX) injection  40 mg Subcutaneous QHS  . furosemide  40 mg Oral Daily  . guaiFENesin  1,200 mg Oral BID  . insulin aspart  0-24 Units Subcutaneous TID AC & HS  . insulin detemir  23 Units Subcutaneous Daily  . mouth rinse  15 mL Mouth Rinse BID  . metoprolol tartrate  12.5 mg Oral BID   Or  . metoprolol tartrate  12.5 mg Per Tube BID  . pantoprazole  40 mg Oral QAC breakfast  . potassium chloride  20 mEq Oral Daily  . revefenacin  175 mcg Inhalation Daily  . silver sulfADIAZINE  1 application Topical BID  . sodium chloride flush  3 mL Intravenous Q12H  . umeclidinium bromide  1 puff Inhalation Daily   Continuous Infusions: . sodium chloride     PRN Meds:.sodium chloride, acetaminophen, bisacodyl **OR** bisacodyl, lactulose, magnesium hydroxide,  ondansetron **OR** ondansetron (ZOFRAN) IV, oxyCODONE, sodium chloride flush, traMADol, triazolam  Xrays No results found.  Assessment/Plan: S/P Procedure(s) (LRB): AORTIC VALVE REPLACEMENT (AVR) using Edwards PERIMOUNT Magna Ease 25MM Bioprosthesis Aortic Valve. (N/A) SUPRA CORONARY REPLACEMENT OF ASCENDING AORTA using Maquet Hemashiel Platinum 34 MM Vascular Graft. (N/A) MAZE with bilateral pulmonary vein isolation. (N/A) TRANSESOPHAGEAL ECHOCARDIOGRAM (TEE) (N/A) Clipping Of Atrial Appendage using AtriCure AtriClip Exclusion VLAA 51mm. (N/A)  1 afeb , VSS 2 first deg AVB 3 sats good on RA 4 BS adeq control for hospitalized patient 5 no new labs 6 awaiting  BM 7 repeat labs in am 8 monitor incis- most likely fat necrosis 9 prob stop lasix soon 10 poss home in am    LOS: 6 days    John Giovanni PA-C Pager [phone] 10/22/2019 Plan home tomorrow on xarelto if bowel function improves  I have seen and examined [name] and agree with the above assessment  and plan.  [name] MD Beeper [phone] Office [phone] 10/22/2019 1:53 PM

## 2019-10-22 NOTE — Progress Notes (Signed)
Pacer wires pulled without difficulty.  Tips intact.  Site unremarkable.  VSS. Patient tolerated well.

## 2019-10-23 ENCOUNTER — Telehealth: Payer: Self-pay | Admitting: *Deleted

## 2019-10-23 LAB — CBC
HCT: 33.1 % — ABNORMAL LOW (ref 39.0–52.0)
Hemoglobin: 10.6 g/dL — ABNORMAL LOW (ref 13.0–17.0)
MCH: 29.7 pg (ref 26.0–34.0)
MCHC: 32 g/dL (ref 30.0–36.0)
MCV: 92.7 fL (ref 80.0–100.0)
Platelets: 207 10*3/uL (ref 150–400)
RBC: 3.57 MIL/uL — ABNORMAL LOW (ref 4.22–5.81)
RDW: 13.6 % (ref 11.5–15.5)
WBC: 9.2 10*3/uL (ref 4.0–10.5)
nRBC: 0 % (ref 0.0–0.2)

## 2019-10-23 LAB — GLUCOSE, CAPILLARY
Glucose-Capillary: 156 mg/dL — ABNORMAL HIGH (ref 70–99)
Glucose-Capillary: 142 mg/dL — ABNORMAL HIGH (ref 70–99)

## 2019-10-23 LAB — BASIC METABOLIC PANEL
Anion gap: 8 (ref 5–15)
BUN: 17 mg/dL (ref 8–23)
CO2: 26 mmol/L (ref 22–32)
Calcium: 8.5 mg/dL — ABNORMAL LOW (ref 8.9–10.3)
Chloride: 106 mmol/L (ref 98–111)
Creatinine, Ser: 1.03 mg/dL (ref 0.61–1.24)
GFR calc Af Amer: >60 mL/min (ref >60)
GFR calc non Af Amer: >60 mL/min (ref >60)
Glucose, Bld: 113 mg/dL — ABNORMAL HIGH (ref 70–99)
Potassium: 3.7 mmol/L (ref 3.5–5.1)
Sodium: 140 mmol/L (ref 135–145)

## 2019-10-23 MED ORDER — OXYCODONE HCL 5 MG PO TABS: 5.0000 mg | ORAL_TABLET | Freq: Four times a day (QID) | ORAL | 0 refills | Status: DC | PRN

## 2019-10-23 MED ORDER — POTASSIUM CHLORIDE CRYS ER 20 MEQ PO TBCR: 20.0000 meq | EXTENDED_RELEASE_TABLET | Freq: Every day | ORAL | 0 refills | Status: DC

## 2019-10-23 MED ORDER — FUROSEMIDE 40 MG PO TABS: 40.0000 mg | ORAL_TABLET | Freq: Every day | ORAL | 0 refills | Status: DC

## 2019-10-23 MED ORDER — ACETAMINOPHEN 325 MG PO TABS: 650.0000 mg | ORAL_TABLET | Freq: Four times a day (QID) | ORAL | Status: DC | PRN

## 2019-10-23 MED ORDER — METOPROLOL TARTRATE 25 MG PO TABS: 12.5000 mg | ORAL_TABLET | Freq: Two times a day (BID) | ORAL | 1 refills | Status: DC

## 2019-10-23 MED ORDER — ASPIRIN EC 81 MG PO TBEC: 81.0000 mg | DELAYED_RELEASE_TABLET | Freq: Every day | ORAL | 1 refills | Status: AC

## 2019-10-23 MED FILL — Electrolyte-R (PH 7.4) Solution: INTRAVENOUS | Qty: 4000 | Status: AC

## 2019-10-23 MED FILL — Sodium Bicarbonate IV Soln 8.4%: INTRAVENOUS | Qty: 50 | Status: AC

## 2019-10-23 MED FILL — Sodium Chloride IV Soln 0.9%: INTRAVENOUS | Qty: 2000 | Status: AC

## 2019-10-23 MED FILL — Lidocaine HCl Local Soln Prefilled Syringe 100 MG/5ML (2%): INTRAMUSCULAR | Qty: 10 | Status: AC

## 2019-10-23 MED FILL — Heparin Sodium (Porcine) Inj 1000 Unit/ML: INTRAMUSCULAR | Qty: 30 | Status: AC

## 2019-10-23 MED FILL — Mannitol IV Soln 20%: INTRAVENOUS | Qty: 500 | Status: AC

## 2019-10-23 MED FILL — Magnesium Sulfate Inj 50%: INTRAMUSCULAR | Qty: 10 | Status: AC

## 2019-10-23 MED FILL — Heparin Sodium (Porcine) Inj 1000 Unit/ML: INTRAMUSCULAR | Qty: 10 | Status: AC

## 2019-10-23 MED FILL — Potassium Chloride Inj 2 mEq/ML: INTRAVENOUS | Qty: 60 | Status: AC

## 2019-10-23 MED FILL — Albumin, Human Inj 5%: INTRAVENOUS | Qty: 250 | Status: AC

## 2019-10-23 NOTE — Progress Notes (Signed)
Discharge instructions given to patient. IV removed, clean and intact. VSS. Medications and wound care reviewed. All questions answered. Pt escorted home with friend.  Arletta Bale, RN

## 2019-10-23 NOTE — Discharge Instructions (Signed)

## 2019-10-23 NOTE — Progress Notes (Addendum)
CARDIAC REHAB PHASE I   PRE:  Rate/Rhythm: 94 SR  BP:  Supine:   Sitting: 118/61  Standing:    SaO2: 97%RA  MODE:  Ambulation: 350 ft   POST:  Rate/Rhythm: 112 ST  BP:  Supine:   Sitting: 167/103,  151/101  Standing:    SaO2: 96%RA 0850-0920 Pt walked 350 ft on RA independently with rolling walker with steady gait. Stood and sat down adhering to sternal precautions. Rested x 1 during walk. Still with some DOE but sats good on RA. Encouraged pursed lip breathing. To recliner after walk. BP elevated after walk. Answered questions re ed done Saturday. Reinforced sternal precautions, IS , walking for ex. Pt voiced understanding. Pt stated he can use fiancee's walker if needed at home. Notified RN of elevated BP.   Graylon Good, RN BSN  10/23/2019 9:15 AM

## 2019-10-23 NOTE — Telephone Encounter (Signed)
Pt was on Patient Clinton Black pt underwent  ascending aortic replacement. PtD/C 10/23/19, and will follow w/specialist Dr. Almyra Deforest 11/10/19.Marland KitchenJohny Chess

## 2019-10-23 NOTE — Progress Notes (Signed)
      [address]       [place], [address]             [phone]      7 Days Post-Op Procedure(s) (LRB): AORTIC VALVE REPLACEMENT (AVR) using Edwards PERIMOUNT Magna Ease 25MM Bioprosthesis Aortic Valve. (N/A) SUPRA CORONARY REPLACEMENT OF ASCENDING AORTA using Maquet Hemashiel Platinum 34 MM Vascular Graft. (N/A) MAZE with bilateral pulmonary vein isolation. (N/A) TRANSESOPHAGEAL ECHOCARDIOGRAM (TEE) (N/A) Clipping Of Atrial Appendage using AtriCure AtriClip Exclusion VLAA 60mm. (N/A) Subjective: Feel okay this morning and wants to go home.   Objective: Vital signs in last 24 hours: Temp:  [98.1 F (36.7 C)-99.3 F (37.4 C)] 98.4 F (36.9 C) (05/10 0412) Pulse Rate:  [71-87] 82 (05/10 0012) Cardiac Rhythm: Atrial fibrillation;Normal sinus rhythm (05/10 0424) Resp:  [13-22] 22 (05/10 0412) BP: (90-149)/(60-88) 108/82 (05/10 0412) SpO2:  [93 %-98 %] 97 % (05/10 0412) Weight:  [137.8 kg] 137.8 kg (05/10 0500)     Intake/Output from previous day: 05/09 0701 - 05/10 0700 In: 480 [P.O.:480] Out: 500 [Urine:500] Intake/Output this shift: No intake/output data recorded.  General appearance: alert, cooperative and no distress Heart: regular rate and rhythm, S1, S2 normal, no murmur, click, rub or gallop Lungs: clear to auscultation bilaterally Abdomen: soft, non-tender; bowel sounds normal; no masses,  no organomegaly Extremities: extremities normal, atraumatic, no cyanosis or edema Wound: clean and dry  Lab Results: Recent Labs    10/23/19 0309  WBC 9.2  HGB 10.6*  HCT 33.1*  PLT 207   BMET:  Recent Labs    10/21/19 0351 10/23/19 0309  NA 138 140  K 3.4* 3.7  CL 102 106  CO2 25 26  GLUCOSE 103* 113*  BUN 26* 17  CREATININE 0.90 1.03  CALCIUM 8.3* 8.5*    PT/INR: No results for input(s): LABPROT, INR in the last 72 hours. ABG    Component Value Date/Time   PHART 7.407 10/16/2019 1948   HCO3 21.9 10/16/2019 1948   TCO2 23  10/16/2019 1948   ACIDBASEDEF 2.0 10/16/2019 1948   O2SAT 96.0 10/16/2019 1948   CBG (last 3)  Recent Labs    10/22/19 1631 10/22/19 2133 10/23/19 0601  GLUCAP 141* 176* 156*    Assessment/Plan: S/P Procedure(s) (LRB): AORTIC VALVE REPLACEMENT (AVR) using Edwards PERIMOUNT Magna Ease 25MM Bioprosthesis Aortic Valve. (N/A) SUPRA CORONARY REPLACEMENT OF ASCENDING AORTA using Maquet Hemashiel Platinum 34 MM Vascular Graft. (N/A) MAZE with bilateral pulmonary vein isolation. (N/A) TRANSESOPHAGEAL ECHOCARDIOGRAM (TEE) (N/A) Clipping Of Atrial Appendage using AtriCure AtriClip Exclusion VLAA 56mm. (N/A)  1. Afib briefly early this morning. NSR this morning in the 80s. BP well controlled. Pacing wires removed.  2. Pulm- tolerating room air with good oxygen saturation 3. Renal-creatinine 1.03, electrolytes okay. Still up 3kg-continue lasix.  4. H and h 10.6/33.1, expected acute blood loss anemia 5. Endo-blood glucose well controlled 6. GI-constipation, resolved  Plan: Will discharge on a few days of lasix due to fluid overload. Will restart back on his Xarelto. No tumeric. Discharge instruction reviewed with the patient.     LOS: 7 days    [name] 10/23/2019

## 2019-10-24 ENCOUNTER — Telehealth (HOSPITAL_COMMUNITY): Payer: Self-pay

## 2019-10-24 NOTE — Telephone Encounter (Signed)
Faxed referral to [facility] for Cardiac Rehab.

## 2019-10-27 LAB — POCT I-STAT 7, (LYTES, BLD GAS, ICA,H+H)
Acid-Base Excess: 1 mmol/L (ref 0.0–2.0)
Bicarbonate: 25.5 mmol/L (ref 20.0–28.0)
Calcium, Ion: 1.12 mmol/L — ABNORMAL LOW (ref 1.15–1.40)
HCT: 34 % — ABNORMAL LOW (ref 39.0–52.0)
Hemoglobin: 11.6 g/dL — ABNORMAL LOW (ref 13.0–17.0)
O2 Saturation: 98 %
Patient temperature: 36.1
Potassium: 6.8 mmol/L (ref 3.5–5.1)
Sodium: 139 mmol/L (ref 135–145)
TCO2: 27 mmol/L (ref 22–32)
pCO2 arterial: 39.6 mmHg (ref 32.0–48.0)
pH, Arterial: 7.412 (ref 7.350–7.450)
pO2, Arterial: 102 mmHg (ref 83.0–108.0)

## 2019-10-29 LAB — ECHO INTRAOPERATIVE TEE
AORTIC ROOT 2D: 4.4 mm
AV Mean grad: 5 mmHg
AV Vena cont: 0.6 cm
Ao-asc: 5.2 cm
Height: 77 in
IVS: 1.5 cm — AB (ref 0.6–1.1)
LVIDD: 6.2 cm
LVOT diameter: 23 mm
Mean grad: 1 mmHg
P 1/2 time: 386 ms
Weight: 4750.3 oz

## 2019-11-04 ENCOUNTER — Other Ambulatory Visit: Payer: Self-pay | Admitting: Physician Assistant

## 2019-11-06 ENCOUNTER — Telehealth: Payer: Self-pay | Admitting: Internal Medicine

## 2019-11-06 ENCOUNTER — Telehealth: Payer: Self-pay

## 2019-11-06 DIAGNOSIS — N400 Enlarged prostate without lower urinary tract symptoms: Secondary | ICD-10-CM

## 2019-11-06 NOTE — Telephone Encounter (Signed)
Clinton Black requested a refill of Lasix/ Potassium since discharge, 10/23/19 s/p AVReplacement 10/16/19 with Dr. Servando Snare.  Dr. Servando Snare requested patient stay off of lasix/ potassium due to weight loss of 304 lbs at discharge to 280 lbs today.  Patient stated that he does not have any swelling in his legs and does not have any problems with shortness of breath.  Advised patient to continue to weigh himself daily and to keep all of his follow-up appointments as scheduled.  He is scheduled to see Cardiology this week and TCTS next week.  Patient advised to contact our office if he experiences any problems with weight, swelling, or shortness of breath.  If he doesn't, patient is instructed to ask Cardiology at next visit wether he should continue medication.  Patient acknowledged receipt.

## 2019-11-06 NOTE — Progress Notes (Signed)
  Chronic Care Management   Note  11/06/2019 Name: Clinton Black MRN: [id number] DOB: 1945/09/13  Clinton Black is a 74 y.o. year old male who is a primary care patient of [name], MD. I reached out to [name] by phone today in response to a referral sent by Clinton Black PCP, [name], MD.   Mr. Yeagley was given information about Chronic Care Management services today including:  1. CCM service includes personalized support from designated clinical staff supervised by his physician, including individualized plan of care and coordination with other care providers 2. 24/7 contact phone numbers for assistance for urgent and routine care needs. 3. Service will only be billed when office clinical staff spend 20 minutes or more in a month to coordinate care. 4. Only one practitioner may furnish and bill the service in a calendar month. 5. The patient may stop CCM services at any time (effective at the end of the month) by phone call to the office staff.   Patient agreed to services and verbal consent obtained.   This note is not being shared with the patient for the following reason: To respect privacy (The patient or proxy has requested that the information not be shared).  Follow up plan:   Earney Hamburg Upstream Scheduler

## 2019-11-10 ENCOUNTER — Ambulatory Visit: Payer: Medicare HMO | Admitting: Physician Assistant

## 2019-11-10 ENCOUNTER — Encounter: Payer: Self-pay | Admitting: Physician Assistant

## 2019-11-10 ENCOUNTER — Other Ambulatory Visit: Payer: Self-pay

## 2019-11-10 VITALS — BP 127/80 | HR 80 | Temp 96.8°F | Ht 77.0 in | Wt 286.8 lb

## 2019-11-10 DIAGNOSIS — Z952 Presence of prosthetic heart valve: Secondary | ICD-10-CM

## 2019-11-10 DIAGNOSIS — I10 Essential (primary) hypertension: Secondary | ICD-10-CM

## 2019-11-10 DIAGNOSIS — E785 Hyperlipidemia, unspecified: Secondary | ICD-10-CM

## 2019-11-10 DIAGNOSIS — I4819 Other persistent atrial fibrillation: Secondary | ICD-10-CM | POA: Diagnosis not present

## 2019-11-10 DIAGNOSIS — Z79899 Other long term (current) drug therapy: Secondary | ICD-10-CM | POA: Diagnosis not present

## 2019-11-10 DIAGNOSIS — D649 Anemia, unspecified: Secondary | ICD-10-CM | POA: Diagnosis not present

## 2019-11-10 DIAGNOSIS — E119 Type 2 diabetes mellitus without complications: Secondary | ICD-10-CM

## 2019-11-10 NOTE — Progress Notes (Signed)
Cardiology Office Note:    Date:  11/13/2019   ID:  Clinton, Black [birth year], MRN [id number]  PCP:  [name], MD  Cardiologist:  [name], MD  Electrophysiologist:  [name], MD   Referring MD: [name], MD   Chief Complaint  Patient presents with  . Follow-up    seen for Dr. [name].    History of Present Illness:    Clinton Black is a 74 y.o. male with a hx of HTN, HLD, DM II, TAA, morbid obesity and persistent atrial fibrillation.  Previous Myoview in August 2018 showed EF 39%, no ischemia, intermediate risk due to reduced LV function.  Echocardiogram obtained around the same time showed EF 55%.  Repeat echocardiogram in October 2019 showed EF 50 to 55%, grade 1 DD, dilated thoracic aorta measuring at 5 cm, moderate AI.  He was diagnosed with atrial fibrillation in December 2019 and started on Xarelto.  He underwent a successful cardioversion in January 2020.  Unfortunately he quickly went back into atrial fibrillation afterward.  Even though he was loaded on amiodarone, subsequent cardioversion was unsuccessful.  Amiodarone was subsequently discontinued.  He returned to the ED in March 2020 with facial droop secondary to Bell's palsy.  MRI was negative for CVA.  Cardiac catheterization performed in September 2020 showed normal coronary anatomy, moderate AI.  He eventually underwent A. fib ablation by Dr. Curt Bears on 04/07/2019.  Postprocedure, he felt better and breathing has improved.  Unfortunately he had recurrence of atrial fibrillation by December 2020 and was placed back on amiodarone therapy.  He underwent a DC cardioversion in January 2021.  Due to the enlarging aortic root, patient eventually underwent planned AVR and aortic graft by Dr. Pia Mau on 10/16/2019 using a Edwards Paramount magna ease 25 mm bioprosthetic aortic valve and a 34 mm graft for the ascending aorta.  He also underwent a modified maze procedure with bilateral pulmonary vein  isolation with RF ablation, clipping of the left atrial appendage.  Postop course was uneventful, he was eventually discharged on 10/23/2019.  Based on telephone visit, his Lasix was recently discontinued along with the potassium as he managed to lose 20 pounds since discharge.  Talking with the patient today he says he was not aware he should discontinue his Lasix and potassium and has been taking it for the past few days.  He only has 2 days of Lasix and potassium left.  He has no lower extremity edema, orthopnea or PND.  He does feel fatigued and occasionally has dizzy spell when he get up too quickly.  I instructed him to stop the Lasix and potassium.  We will obtain a CBC and basic metabolic panel.  He has upcoming visit with Dr. Servando Snare and the repeat echocardiogram after that.  He can follow-up with Dr. [name] in 3 months.   Past Medical History:  Diagnosis Date  . Arthritis   . Atrial fibrillation ([place])   . BPH (benign prostatic hyperplasia)   . Diabetes mellitus type 2 in obese (HCC)    diet controlled  . HTN (hypertension)   . Hyperlipidemia, mixed   . Insomnia   . Obesity   . Thoracic aortic aneurysm, without rupture (HCC)    4.7 cm per chest ct with contrast 11-04-17    Past Surgical History:  Procedure Laterality Date  . AORTIC VALVE REPLACEMENT N/A 10/16/2019   Procedure: AORTIC VALVE REPLACEMENT (AVR) using Edwards PERIMOUNT Magna Ease 25MM Bioprosthesis Aortic Valve.;  Surgeon: [name], MD;  Location: [place];  Service: Open Heart Surgery;  Laterality: N/A;  Right subclavian artery cannulation.  . AORTIC VALVE REPLACEMENT  10/16/2019   AORTIC VALVE REPLACEMENT (AVR)   . ATRIAL FIBRILLATION ABLATION N/A 04/07/2019   Procedure: ATRIAL FIBRILLATION ABLATION;  Surgeon: [name], MD;  Location: [place] [facility];  Service: Cardiovascular;  Laterality: N/A;  . CARDIOVERSION N/A 07/12/2018   Procedure: CARDIOVERSION;  Surgeon: Thayer Headings, MD;  Location[facility] ENDOSCOPY;  Service: Cardiovascular;  Laterality: N/A;  . CARDIOVERSION N/A 08/22/2018   Procedure: CARDIOVERSION;  Surgeon: Skeet Latch, MD;  Location: [name];  Service: Cardiovascular;  Laterality: N/A;  . CARDIOVERSION N/A 06/28/2019   Procedure: CARDIOVERSION;  Surgeon: Thayer Headings, MD;  Location: [place];  Service: Cardiovascular;  Laterality: N/A;  . CLIPPING OF ATRIAL APPENDAGE N/A 10/16/2019   Procedure: Clipping Of Atrial Appendage using AtriCure AtriClip Exclusion VLAA 94mm.;  Surgeon: [name], MD;  Location: [place];  Service: Open Heart Surgery;  Laterality: N/A;  . COLONOSCOPY  2016  . CYST EXCISION     polynomial  . CYSTOSCOPY WITH INSERTION OF UROLIFT N/A 04/18/2018   Procedure: CYSTOSCOPY WITH INSERTION OF UROLIFT;  Surgeon: Cleon Gustin, MD;  [facility];  Service: Urology;  Laterality: N/A;  . KNEE SURGERY Right 2000   arthroscopy  . MAZE N/A 10/16/2019   Procedure: MAZE with bilateral pulmonary vein isolation.;  Surgeon: [name], MD;  Location: [place];  Service: Open Heart Surgery;  Laterality: N/A;  Maze procedure using Atricure OLL2 Isolator clamp.  . REPLACEMENT ASCENDING AORTA N/A 10/16/2019   Procedure: SUPRA CORONARY REPLACEMENT OF ASCENDING AORTA using Maquet Hemashiel Platinum 34 MM Vascular Graft.;  Surgeon: [name], MD;  Location: [place];  Service: Open Heart Surgery;  Laterality: N/A;  Right subclavian artery cannulation.  Marland Kitchen RIGHT/LEFT HEART CATH AND CORONARY ANGIOGRAPHY N/A 01/30/2019   Procedure: RIGHT/LEFT HEART CATH AND CORONARY ANGIOGRAPHY;  Surgeon: [name], [name], MD;  Location: [place] [facility];  Service: Cardiovascular;  Laterality: N/A;  . TEE WITHOUT CARDIOVERSION N/A 10/16/2019   Procedure: TRANSESOPHAGEAL ECHOCARDIOGRAM (TEE);  Surgeon: [name], MD;  Location: [place];  Service: Open Heart Surgery;  Laterality: N/A;  . TONSILLECTOMY    . widson teeth extraction       Current Medications: Current Meds  Medication Sig  . acetaminophen (TYLENOL) 325 MG tablet Take 2 tablets (650 mg total) by mouth every 6 (six) hours as needed for mild pain.  Marland Kitchen alfuzosin (UROXATRAL) 10 MG 24 hr tablet Take 1 tablet (10 mg total) by mouth daily with breakfast.  . amiodarone (PACERONE) 200 MG tablet Take 1 tablet (200 mg total) by mouth daily.  Marland Kitchen aspirin EC 81 MG tablet Take 1 tablet (81 mg total) by mouth daily.  Marland Kitchen atorvastatin (LIPITOR) 20 MG tablet TAKE 1 TABLET BY MOUTH EVERYDAY AT BEDTIME (Patient taking differently: Take 20 mg by mouth daily. )  . Empagliflozin-metFORMIN HCl ER (SYNJARDY XR) 25-1000 MG TB24 Take 1 tablet by mouth daily.  . furosemide (LASIX) 40 MG tablet Take 1 tablet (40 mg total) by mouth daily.  . hydrocortisone cream 1 % Apply 1 application topically daily as needed (rash).  . Menthol-Methyl Salicylate (SALONPAS PAIN RELIEF PATCH EX) Apply 1 patch topically daily as needed (back pain).  . metoprolol tartrate (LOPRESSOR) 25 MG tablet Take 0.5 tablets (12.5 mg total) by mouth 2 (two)  times daily.  Marland Kitchen oxyCODONE (OXY IR/ROXICODONE) 5 MG immediate release tablet Take 1 tablet (5 mg total) by mouth every 6 (six) hours as needed for severe pain.  . potassium chloride SA (KLOR-CON) 20 MEQ tablet Take 1 tablet (20 mEq total) by mouth daily.  Marland Kitchen spironolactone (ALDACTONE) 25 MG tablet TAKE 0.5 TABLETS (12.5 MG TOTAL) BY MOUTH AT BEDTIME.  . Tiotropium Bromide Monohydrate (SPIRIVA RESPIMAT) 1.25 MCG/ACT AERS Inhale 2 puffs into the lungs daily.  . triazolam (HALCION) 0.25 MG tablet TAKE 1 TABLET (0.25 MG TOTAL) BY MOUTH AT BEDTIME AS NEEDED FOR SLEEP.  Marland Kitchen XARELTO 20 MG TABS tablet TAKE 1 TABLET (20 MG TOTAL) BY MOUTH DAILY WITH SUPPER. (Patient taking differently: Take 20 mg by mouth daily with supper. )     Allergies:   Quinolones   Social History   Socioeconomic History  . Marital status: Single    Spouse name: Not on file  . Number of children: Not on  file  . Years of education: Not on file  . Highest education level: Not on file  Occupational History  . Not on file  Tobacco Use  . Smoking status: Former Smoker    Packs/day: 1.50    Years: 5.00    Pack years: 7.50    Types: Cigarettes    Quit date: 06/15/1994    Years since quitting: 25.4  . Smokeless tobacco: Never Used  Substance and Sexual Activity  . Alcohol use: Not Currently    Alcohol/week: 14.0 standard drinks    Types: 14 Shots of liquor per week    Comment: 1 drink of liquor per day  . Drug use: Yes    Types: Marijuana    Comment: occasionally marijuana  . Sexual activity: Yes    Partners: Female    Birth control/protection: Condom    Comment: condom use most of the time but not always  Other Topics Concern  . Not on file  [place] - [employer] and [employer]. married '72- 3 years/divorced; married '89- 3 years/divorced;. married '03 - 3 years/divorced. 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   Social Determinants of Health   Financial Resource Strain:   . Difficulty of Paying Living Expenses:   Food Insecurity:   . Worried About Charity fundraiser in the Last Year:   . Arboriculturist in the Last Year:   Transportation Needs:   . Film/video editor (Medical):   Marland Kitchen Lack of Transportation (Non-Medical):   Physical Activity:   . Days of Exercise per Week:   . Minutes of Exercise per Session:   Stress:   . Feeling of Stress :   Social Connections:   . Frequency of Communication with Friends and Family:   . Frequency of Social Gatherings with Friends and Family:   . Attends Religious Services:   . Active Member of Clubs or Organizations:   . Attends Archivist Meetings:   Marland Kitchen Marital Status:      Family History: The patient's family history includes Aneurysm in his father; Arthritis in his mother; Heart disease in his father; Macular degeneration in his mother; Varicose Veins in his mother. There is no history of Cancer, Colon  cancer, Esophageal cancer, Rectal cancer, or Stomach cancer.  ROS:   Please see the history of present illness.     All other systems reviewed and are negative.  EKGs/Labs/Other Studies Reviewed:    The following studies were reviewed today:  Echo 07/28/2019 1. Left ventricular ejection fraction, by estimation, is 60%. The left  ventricle has normal function. The left ventricle has no regional wall  motion abnormalities. The left ventricular internal cavity size was mildly  dilated. There is mildly increased  left ventricular hypertrophy. Left ventricular diastolic parameters are  indeterminate.  2. Right ventricular systolic function is normal. The right ventricular  size is normal. Tricuspid regurgitation signal is inadequate for assessing  PA pressure.  3. The mitral valve is normal in structure and function. No evidence of  mitral valve regurgitation. No evidence of mitral stenosis.  4. The aortic valve is grossly normal. Aortic valve regurgitation is  moderate. No aortic stenosis is present.  5. Aortic dilatation noted. 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  6. The inferior vena cava is normal in size with greater than 50%  respiratory variability, suggesting right atrial pressure of 3 mmHg.   EKG:  EKG is  ordered today.  The ekg ordered today demonstrates normal sinus rhythm, no significant ST-T wave changes  Recent Labs: 05/29/2019: Pro B Natriuretic peptide (BNP) 95.0 09/06/2019: TSH 4.29 10/12/2019: ALT 26 10/17/2019: Magnesium 2.1 11/10/2019: BUN 36; Creatinine, Ser 1.53; Hemoglobin 13.3; Platelets 253; Potassium 4.6; Sodium 139  Recent Lipid Panel    Component Value Date/Time   CHOL 148 05/29/2019 1430   CHOL 105 03/11/2017 1541   TRIG 255.0 (H) 05/29/2019 1430   HDL 34.00 (L) 05/29/2019 1430   HDL 36 (L) 03/11/2017 1541   CHOLHDL 4 05/29/2019 1430   VLDL 51.0 (H) 05/29/2019 1430   LDLCALC 48 06/01/2018 1328    LDLCALC 46 03/11/2017 1541   LDLDIRECT 78.0 05/29/2019 1430    Physical Exam:    VS:  BP 127/80   Pulse 80   Temp (!) 96.8 F (36 C)   Ht 6\' 5"  (1.956 m)   Wt 286 lb 12.8 oz (130.1 kg)   SpO2 94%   BMI 34.01 kg/m     Wt Readings from Last 3 Encounters:  11/10/19 286 lb 12.8 oz (130.1 kg)  10/23/19 (!) 303 lb 12.7 oz (137.8 kg)  10/12/19 296 lb 14.4 oz (134.7 kg)     GEN:  Well nourished, well developed in no acute distress HEENT: Normal NECK: No JVD; No carotid bruits LYMPHATICS: No lymphadenopathy CARDIAC: RRR, no murmurs, rubs, gallops RESPIRATORY:  Clear to auscultation without rales, wheezing or rhonchi  ABDOMEN: Soft, non-tender, non-distended MUSCULOSKELETAL:  No edema; No deformity  SKIN: Warm and dry NEUROLOGIC:  Alert and oriented x 3 PSYCHIATRIC:  Normal affect   ASSESSMENT:    1. S/P AVR (aortic valve replacement) and aortoplasty   2. Persistent atrial fibrillation ([place])   3. Medication management   4. Anemia, unspecified type   5. Essential hypertension   6. Hyperlipidemia LDL goal <70   7. Controlled type 2 diabetes mellitus without complication, without long-term current use of insulin (HCC)    PLAN:    In order of problems listed above:  1. Recent AVR and aortoplasty: Patient is recovering okay.  Postprocedure, he had volume overload.  He was instructed to stop Lasix and potassium given significant weight loss while on the short course of diuretic.  Obtain CBC and basic metabolic panel.  2. Persistent atrial fibrillation: On amiodarone therapy.  He had recurrence of atrial fibrillation despite ablation therapy.  He recently underwent maze procedure and left atrial appendage clipping during the AVR surgery.  He is maintaining  sinus rhythm  3. Anemia: Obtain CBC  4. Hypertension: Blood pressure stable  5. Hyperlipidemia: Continue Lipitor  6. DM2: Managed by primary care provider.   Medication Adjustments/Labs and Tests Ordered: Current  medicines are reviewed at length with the patient today.  Concerns regarding medicines are outlined above.  Orders Placed This Encounter  Procedures  . Basic metabolic panel  . CBC  . EKG 12-Lead   No orders of the defined types were placed in this encounter.   Patient Instructions  Medication Instructions:   HOLD Lasix and Potassium. Do not throw away put to the side until further notice *If you need a refill on your cardiac medications before your next appointment, please call your pharmacy*  Lab Work: Your physician recommends that you return for lab work TODAY:   BMET  CBC  If you have labs (blood work) drawn today and your tests are completely normal, you will receive your results only by: Marland Kitchen MyChart Message (if you have MyChart) OR . A paper copy in the mail If you have any lab test that is abnormal or we need to change your treatment, we will call you to review the results.  Testing/Procedures: NONE ordered at this time of appointment   Follow-Up: At [facility], you and your health needs are our priority.  As part of our continuing mission to provide you with exceptional heart care, we have created designated Provider Care Teams.  These Care Teams include your primary Cardiologist (physician) and Advanced Practice Providers (APPs -  Physician Assistants and Nurse Practitioners) who all work together to provide you with the care you need, when you need it.     Your next appointment:   2-3 month(s)  The format for your next appointment:   In Person  Provider:   [name], MD  Other Instructions      Signed, Almyra Deforest, [place]  11/13/2019 12:24 AM    [place]

## 2019-11-10 NOTE — Patient Instructions (Signed)
Medication Instructions:   HOLD Lasix and Potassium. Do not throw away put to the side until further notice *If you need a refill on your cardiac medications before your next appointment, please call your pharmacy*  Lab Work: Your physician recommends that you return for lab work TODAY:   BMET  CBC  If you have labs (blood work) drawn today and your tests are completely normal, you will receive your results only by: Marland Kitchen MyChart Message (if you have MyChart) OR . A paper copy in the mail If you have any lab test that is abnormal or we need to change your treatment, we will call you to review the results.  Testing/Procedures: NONE ordered at this time of appointment   Follow-Up: At [facility], you and your health needs are our priority.  As part of our continuing mission to provide you with exceptional heart care, we have created designated Provider Care Teams.  These Care Teams include your primary Cardiologist (physician) and Advanced Practice Providers (APPs -  Physician Assistants and Nurse Practitioners) who all work together to provide you with the care you need, when you need it.     Your next appointment:   2-3 month(s)  The format for your next appointment:   In Person  Provider:   [name], MD  Other Instructions

## 2019-11-11 LAB — BASIC METABOLIC PANEL
BUN/Creatinine Ratio: 24 (ref 10–24)
BUN: 36 mg/dL — ABNORMAL HIGH (ref 8–27)
CO2: 19 mmol/L — ABNORMAL LOW (ref 20–29)
Calcium: 9.6 mg/dL (ref 8.6–10.2)
Chloride: 102 mmol/L (ref 96–106)
Creatinine, Ser: 1.53 mg/dL — ABNORMAL HIGH (ref 0.76–1.27)
GFR calc Af Amer: 52 mL/min/{1.73_m2} — ABNORMAL LOW (ref >59)
GFR calc non Af Amer: 45 mL/min/{1.73_m2} — ABNORMAL LOW (ref >59)
Glucose: 109 mg/dL — ABNORMAL HIGH (ref 65–99)
Potassium: 4.6 mmol/L (ref 3.5–5.2)
Sodium: 139 mmol/L (ref 134–144)

## 2019-11-11 LAB — CBC
Hematocrit: 41.3 % (ref 37.5–51.0)
Hemoglobin: 13.3 g/dL (ref 13.0–17.7)
MCH: 28.9 pg (ref 26.6–33.0)
MCHC: 32.2 g/dL (ref 31.5–35.7)
MCV: 90 fL (ref 79–97)
Platelets: 253 10*3/uL (ref 150–450)
RBC: 4.61 x10E6/uL (ref 4.14–5.80)
RDW: 13.2 % (ref 11.6–15.4)
WBC: 8.7 10*3/uL (ref 3.4–10.8)

## 2019-11-13 ENCOUNTER — Encounter: Payer: Self-pay | Admitting: Physician Assistant

## 2019-11-14 ENCOUNTER — Other Ambulatory Visit: Payer: Self-pay

## 2019-11-14 ENCOUNTER — Other Ambulatory Visit: Payer: Self-pay | Admitting: Physician Assistant

## 2019-11-14 ENCOUNTER — Ambulatory Visit (INDEPENDENT_AMBULATORY_CARE_PROVIDER_SITE_OTHER): Payer: Medicare HMO | Admitting: Internal Medicine

## 2019-11-14 ENCOUNTER — Encounter: Payer: Self-pay | Admitting: Internal Medicine

## 2019-11-14 VITALS — BP 110/70 | HR 77 | Temp 97.8°F | Ht 77.0 in | Wt 292.0 lb

## 2019-11-14 DIAGNOSIS — E118 Type 2 diabetes mellitus with unspecified complications: Secondary | ICD-10-CM | POA: Diagnosis not present

## 2019-11-14 DIAGNOSIS — I4819 Other persistent atrial fibrillation: Secondary | ICD-10-CM

## 2019-11-14 DIAGNOSIS — G4700 Insomnia, unspecified: Secondary | ICD-10-CM

## 2019-11-14 DIAGNOSIS — I10 Essential (primary) hypertension: Secondary | ICD-10-CM | POA: Diagnosis not present

## 2019-11-14 DIAGNOSIS — R972 Elevated prostate specific antigen [PSA]: Secondary | ICD-10-CM | POA: Diagnosis not present

## 2019-11-14 DIAGNOSIS — G4730 Sleep apnea, unspecified: Secondary | ICD-10-CM

## 2019-11-14 LAB — PSA: PSA: 4.38

## 2019-11-14 NOTE — Patient Instructions (Signed)
Type 2 Diabetes Mellitus, Diagnosis, Adult Type 2 diabetes (type 2 diabetes mellitus) is a long-term (chronic) disease. In type 2 diabetes, one or both of these problems may be present:  The pancreas does not make enough of a hormone called insulin.  Cells in the body do not respond properly to insulin that the body makes (insulin resistance). Normally, insulin allows blood sugar (glucose) to enter cells in the body. The cells use glucose for energy. Insulin resistance or lack of insulin causes excess glucose to build up in the blood instead of going into cells. As a result, high blood glucose (hyperglycemia) develops. What increases the risk? The following factors may make you more likely to develop type 2 diabetes:  Having a family member with type 2 diabetes.  Being overweight or obese.  Having an inactive (sedentary) lifestyle.  Having been diagnosed with insulin resistance.  Having a history of prediabetes, gestational diabetes, or polycystic ovary syndrome (PCOS).  Being of American-Indian, African-American, Hispanic/Latino, or Asian/Pacific Islander descent. What are the signs or symptoms? In the early stage of this condition, you may not have symptoms. Symptoms develop slowly and may include:  Increased thirst (polydipsia).  Increased hunger(polyphagia).  Increased urination (polyuria).  Increased urination during the night (nocturia).  Unexplained weight loss.  Frequent infections that keep coming back (recurring).  Fatigue.  Weakness.  Vision changes, such as blurry vision.  Cuts or bruises that are slow to heal.  Tingling or numbness in the hands or feet.  Dark patches on the skin (acanthosis nigricans). How is this diagnosed? This condition is diagnosed based on your symptoms, your medical history, a physical exam, and your blood glucose level. Your blood glucose may be checked with one or more of the following blood tests:  A fasting blood glucose (FBG)  test. You will not be allowed to eat (you will fast) for 8 hours or longer before a blood sample is taken.  A random blood glucose test. This test checks blood glucose at any time of day regardless of when you ate.  An A1c (hemoglobin A1c) blood test. This test provides information about blood glucose control over the previous 2-3 months.  An oral glucose tolerance test (OGTT). This test measures your blood glucose at two times: ? After fasting. This is your baseline blood glucose level. ? Two hours after drinking a beverage that contains glucose. You may be diagnosed with type 2 diabetes if:  Your FBG level is 126 mg/dL (7.0 mmol/L) or higher.  Your random blood glucose level is 200 mg/dL (11.1 mmol/L) or higher.  Your A1c level is 6.5% or higher.  Your OGTT result is higher than 200 mg/dL (11.1 mmol/L). These blood tests may be repeated to confirm your diagnosis. How is this treated? Your treatment may be managed by a specialist called an endocrinologist. Type 2 diabetes may be treated by following instructions from your health care provider about:  Making diet and lifestyle changes. This may include: ? Following an individualized nutrition plan that is developed by a diet and nutrition specialist (registered dietitian). ? Exercising regularly. ? Finding ways to manage stress.  Checking your blood glucose level as often as told.  Taking diabetes medicines or insulin daily. This helps to keep your blood glucose levels in the healthy range. ? If you use insulin, you may need to adjust the dosage depending on how physically active you are and what foods you eat. Your health care provider will tell you how to adjust your dosage.    Taking medicines to help prevent complications from diabetes, such as: ? Aspirin. ? Medicine to lower cholesterol. ? Medicine to control blood pressure. Your health care provider will set individualized treatment goals for you. Your goals will be based on  your age, other medical conditions you have, and how you respond to diabetes treatment. Generally, the goal of treatment is to maintain the following blood glucose levels:  Before meals (preprandial): 80-130 mg/dL (4.4-7.2 mmol/L).  After meals (postprandial): below 180 mg/dL (10 mmol/L).  A1c level: less than 7%. Follow these instructions at home: Questions to ask your health care provider  Consider asking the following questions: ? Do I need to meet with a diabetes educator? ? Where can I find a support group for people with diabetes? ? What equipment will I need to manage my diabetes at home? ? What diabetes medicines do I need, and when should I take them? ? How often do I need to check my blood glucose? ? What number can I call if I have questions? ? When is my next appointment? General instructions  Take over-the-counter and prescription medicines only as told by your health care provider.  Keep all follow-up visits as told by your health care provider. This is important.  For more information about diabetes, visit: ? American Diabetes Association (ADA): www.diabetes.org ? American Association of Diabetes Educators (AADE): www.diabeteseducator.org Contact a health care provider if:  Your blood glucose is at or above 240 mg/dL (13.3 mmol/L) for 2 days in a row.  You have been sick or have had a fever for 2 days or longer, and you are not getting better.  You have any of the following problems for more than 6 hours: ? You cannot eat or drink. ? You have nausea and vomiting. ? You have diarrhea. Get help right away if:  Your blood glucose is lower than 54 mg/dL (3.0 mmol/L).  You become confused or you have trouble thinking clearly.  You have difficulty breathing.  You have moderate or large ketone levels in your urine. Summary  Type 2 diabetes (type 2 diabetes mellitus) is a long-term (chronic) disease. In type 2 diabetes, the pancreas does not make enough of a  hormone called insulin, or cells in the body do not respond properly to insulin that the body makes (insulin resistance).  This condition is treated by making diet and lifestyle changes and taking diabetes medicines or insulin.  Your health care provider will set individualized treatment goals for you. Your goals will be based on your age, other medical conditions you have, and how you respond to diabetes treatment.  Keep all follow-up visits as told by your health care provider. This is important. This information is not intended to replace advice given to you by your health care provider. Make sure you discuss any questions you have with your health care provider. Document Revised: 07/30/2017 Document Reviewed: 07/05/2015 Elsevier Patient Education  2020 Elsevier Inc.  

## 2019-11-14 NOTE — Progress Notes (Signed)
Subjective:  Patient ID: Clinton Black, male    DOB: 1945-11-18  Age: 74 y.o. MRN: [id number]  CC: Atrial Fibrillation, Hypertension, and Diabetes  This visit occurred during the SARS-CoV-2 public health emergency.  Safety protocols were in place, including screening questions prior to the visit, additional usage of staff PPE, and extensive cleaning of exam room while observing appropriate contact time as indicated for disinfecting solutions.    HPI Clinton Black presents for f/up - He is status post repair of a thoracic aortic aneurysm.  He says he is doing amazingly well.  Preoperatively he was screened for sleep apnea and his score was greater than 5.  He has a history of sleep apnea which it sounds like it is not currently being treated.  He is aware of heavy snoring but he says no one hears his breathing to know whether or not he is currently experiencing apnea.  Outpatient Medications Prior to Visit  Medication Sig Dispense Refill  . acetaminophen (TYLENOL) 325 MG tablet Take 2 tablets (650 mg total) by mouth every 6 (six) hours as needed for mild pain.    Marland Kitchen alfuzosin (UROXATRAL) 10 MG 24 hr tablet Take 1 tablet (10 mg total) by mouth daily with breakfast. 90 tablet 1  . amiodarone (PACERONE) 200 MG tablet Take 1 tablet (200 mg total) by mouth daily. 90 tablet 2  . aspirin EC 81 MG tablet Take 1 tablet (81 mg total) by mouth daily. 30 tablet 1  . atorvastatin (LIPITOR) 20 MG tablet TAKE 1 TABLET BY MOUTH EVERYDAY AT BEDTIME (Patient taking differently: Take 20 mg by mouth daily. ) 90 tablet 1  . Empagliflozin-metFORMIN HCl ER (SYNJARDY XR) 25-1000 MG TB24 Take 1 tablet by mouth daily. 90 tablet 1  . hydrocortisone cream 1 % Apply 1 application topically daily as needed (rash).    . Menthol-Methyl Salicylate (SALONPAS PAIN RELIEF PATCH EX) Apply 1 patch topically daily as needed (back pain).    . metoprolol tartrate (LOPRESSOR) 25 MG tablet Take 0.5 tablets (12.5 mg total) by mouth 2  (two) times daily. 30 tablet 1  . oxyCODONE (OXY IR/ROXICODONE) 5 MG immediate release tablet Take 1 tablet (5 mg total) by mouth every 6 (six) hours as needed for severe pain. 30 tablet 0  . spironolactone (ALDACTONE) 25 MG tablet TAKE 0.5 TABLETS (12.5 MG TOTAL) BY MOUTH AT BEDTIME. 45 tablet 0  . Tiotropium Bromide Monohydrate (SPIRIVA RESPIMAT) 1.25 MCG/ACT AERS Inhale 2 puffs into the lungs daily. 12 g 1  . triazolam (HALCION) 0.25 MG tablet TAKE 1 TABLET (0.25 MG TOTAL) BY MOUTH AT BEDTIME AS NEEDED FOR SLEEP. 30 tablet 3  . XARELTO 20 MG TABS tablet TAKE 1 TABLET (20 MG TOTAL) BY MOUTH DAILY WITH SUPPER. (Patient taking differently: Take 20 mg by mouth daily with supper. ) 90 tablet 1  . furosemide (LASIX) 40 MG tablet Take 1 tablet (40 mg total) by mouth daily. 21 tablet 0  . potassium chloride SA (KLOR-CON) 20 MEQ tablet Take 1 tablet (20 mEq total) by mouth daily. 21 tablet 0   No facility-administered medications prior to visit.    ROS Review of Systems  Constitutional: Negative for diaphoresis, fatigue and unexpected weight change.  HENT: Negative.  Negative for trouble swallowing.   Eyes: Negative for visual disturbance.  Respiratory: Positive for apnea. Negative for cough, shortness of breath and wheezing.        +snoring  Cardiovascular: Negative for chest pain, palpitations and leg  swelling.  Gastrointestinal: Negative for abdominal pain, constipation, diarrhea, nausea and vomiting.  Endocrine: Negative.   Genitourinary: Negative.  Negative for difficulty urinating.  Musculoskeletal: Negative.  Negative for arthralgias, myalgias and neck pain.  Skin: Negative.   Neurological: Negative.  Negative for dizziness, weakness, light-headedness, numbness and headaches.  Hematological: Negative for adenopathy. Does not bruise/bleed easily.  Psychiatric/Behavioral: Negative.     Objective:  BP 110/70 (BP Location: Left Arm, Patient Position: Sitting, Cuff Size: Large)   Pulse 77    Temp 97.8 F (36.6 C) (Oral)   Ht 6\' 5"  (1.956 m)   Wt 292 lb (132.5 kg)   SpO2 96%   BMI 34.63 kg/m   BP Readings from Last 3 Encounters:  11/14/19 110/70  11/10/19 127/80  10/23/19 119/80    Wt Readings from Last 3 Encounters:  11/14/19 292 lb (132.5 kg)  11/10/19 286 lb 12.8 oz (130.1 kg)  10/23/19 (!) 303 lb 12.7 oz (137.8 kg)    Physical Exam Vitals reviewed.  Constitutional:      Appearance: He is obese.  HENT:     Nose: Nose normal.     Mouth/Throat:     Mouth: Mucous membranes are moist.  Eyes:     General: No scleral icterus.    Conjunctiva/sclera: Conjunctivae normal.  Cardiovascular:     Rate and Rhythm: Normal rate and regular rhythm.     Heart sounds: No murmur.  Pulmonary:     Effort: Pulmonary effort is normal.     Breath sounds: No stridor. No wheezing, rhonchi or rales.  Abdominal:     General: Abdomen is protuberant. Bowel sounds are normal. There is no distension.     Palpations: Abdomen is soft. There is no hepatomegaly, splenomegaly or mass.     Tenderness: There is no abdominal tenderness.  Musculoskeletal:        General: Normal range of motion.     Cervical back: Neck supple.     Right lower leg: No edema.     Left lower leg: No edema.  Lymphadenopathy:     Cervical: No cervical adenopathy.  Skin:    General: Skin is warm and dry.     Coloration: Skin is not pale.  Neurological:     General: No focal deficit present.     Mental Status: He is alert and oriented to person, place, and time. Mental status is at baseline.  Psychiatric:        Behavior: Behavior normal.     Lab Results  Component Value Date   WBC 8.7 11/10/2019   HGB 13.3 11/10/2019   HCT 41.3 11/10/2019   PLT 253 11/10/2019   GLUCOSE 109 (H) 11/10/2019   CHOL 148 05/29/2019   TRIG 255.0 (H) 05/29/2019   HDL 34.00 (L) 05/29/2019   LDLDIRECT 78.0 05/29/2019   LDLCALC 48 06/01/2018   ALT 26 10/12/2019   AST 20 10/12/2019   NA 139 11/10/2019   K 4.6  11/10/2019   CL 102 11/10/2019   CREATININE 1.53 (H) 11/10/2019   BUN 36 (H) 11/10/2019   CO2 19 (L) 11/10/2019   TSH 4.29 09/06/2019   PSA 4.75 (H) 05/29/2019   INR 1.4 (H) 10/16/2019   HGBA1C 6.3 (H) 10/12/2019   MICROALBUR 0.9 05/29/2019    10/12/19 1105  OBSTRUCTIVE SLEEP APNEA  Have you ever been diagnosed with sleep apnea through a sleep study? No  Do you snore loudly (loud enough to be heard through closed doors)?  0  Do you often feel tired, fatigued, or sleepy during the daytime (such as falling asleep during driving or talking to someone)? 0  Has anyone observed you stop breathing during your sleep? 0  Do you have, or are you being treated for high blood pressure? 1  BMI more than 35 kg/m2? 1  Age > 50 (1-yes) 1  Neck circumference greater than:Male 16 inches or larger, Male 17inches or larger? 1  Male Gender (Yes=1) 1  Obstructive Sleep Apnea Score 5  Score 5 or greater  Results sent to PCP      Assessment & Plan:   Lamari was seen today for atrial fibrillation, hypertension and diabetes.  Diagnoses and all orders for this visit:  Essential hypertension- His blood pressure is adequately well controlled.  Persistent atrial fibrillation ([place])- He is maintaining sinus rhythm.  Insomnia w/ sleep apnea -     Ambulatory referral to Sleep Studies  Type 2 diabetes mellitus with complication, without long-term current use of insulin ([place])- His recent A1c was 6.3%.  His blood sugars have been adequately well controlled. -     HM Diabetes Foot Exam   I have discontinued Quaron P. Riehl's furosemide and potassium chloride SA. I am also having him maintain his hydrocortisone cream, Menthol-Methyl Salicylate (SALONPAS PAIN RELIEF PATCH EX), alfuzosin, Spiriva Respimat, amiodarone, atorvastatin, spironolactone, Synjardy XR, Xarelto, triazolam, acetaminophen, aspirin EC, oxyCODONE, and metoprolol tartrate.  No orders of the defined types were placed in this  encounter.    Follow-up: Return in about 4 months (around 03/15/2020).  Scarlette Calico, MD

## 2019-11-15 ENCOUNTER — Other Ambulatory Visit: Payer: Self-pay | Admitting: Cardiothoracic Surgery

## 2019-11-15 DIAGNOSIS — Z95828 Presence of other vascular implants and grafts: Secondary | ICD-10-CM

## 2019-11-16 ENCOUNTER — Other Ambulatory Visit: Payer: Self-pay

## 2019-11-16 ENCOUNTER — Ambulatory Visit (INDEPENDENT_AMBULATORY_CARE_PROVIDER_SITE_OTHER): Payer: Self-pay | Admitting: Cardiothoracic Surgery

## 2019-11-16 ENCOUNTER — Telehealth: Payer: Self-pay

## 2019-11-16 ENCOUNTER — Ambulatory Visit
Admission: RE | Admit: 2019-11-16 | Discharge: 2019-11-16 | Disposition: A | Discharge status: Home / Self Care | Payer: Medicare HMO | Source: Ambulatory Visit | Attending: Cardiothoracic Surgery | Admitting: Cardiothoracic Surgery

## 2019-11-16 ENCOUNTER — Encounter: Payer: Self-pay | Admitting: Cardiothoracic Surgery

## 2019-11-16 VITALS — BP 104/69 | HR 75 | Temp 97.5°F | Resp 22 | Ht 77.0 in | Wt 293.8 lb

## 2019-11-16 DIAGNOSIS — Z95828 Presence of other vascular implants and grafts: Secondary | ICD-10-CM

## 2019-11-16 DIAGNOSIS — Z952 Presence of prosthetic heart valve: Secondary | ICD-10-CM | POA: Diagnosis not present

## 2019-11-16 IMAGING — DX DG CHEST 2V
2 series · 2 of 2 positions shown · non-contrast
Comparison: [DATE]

CLINICAL DATA: Ascending aorta repair

EXAM:
CHEST - 2 VIEW

[dg chest 2 view (1 of 2)]
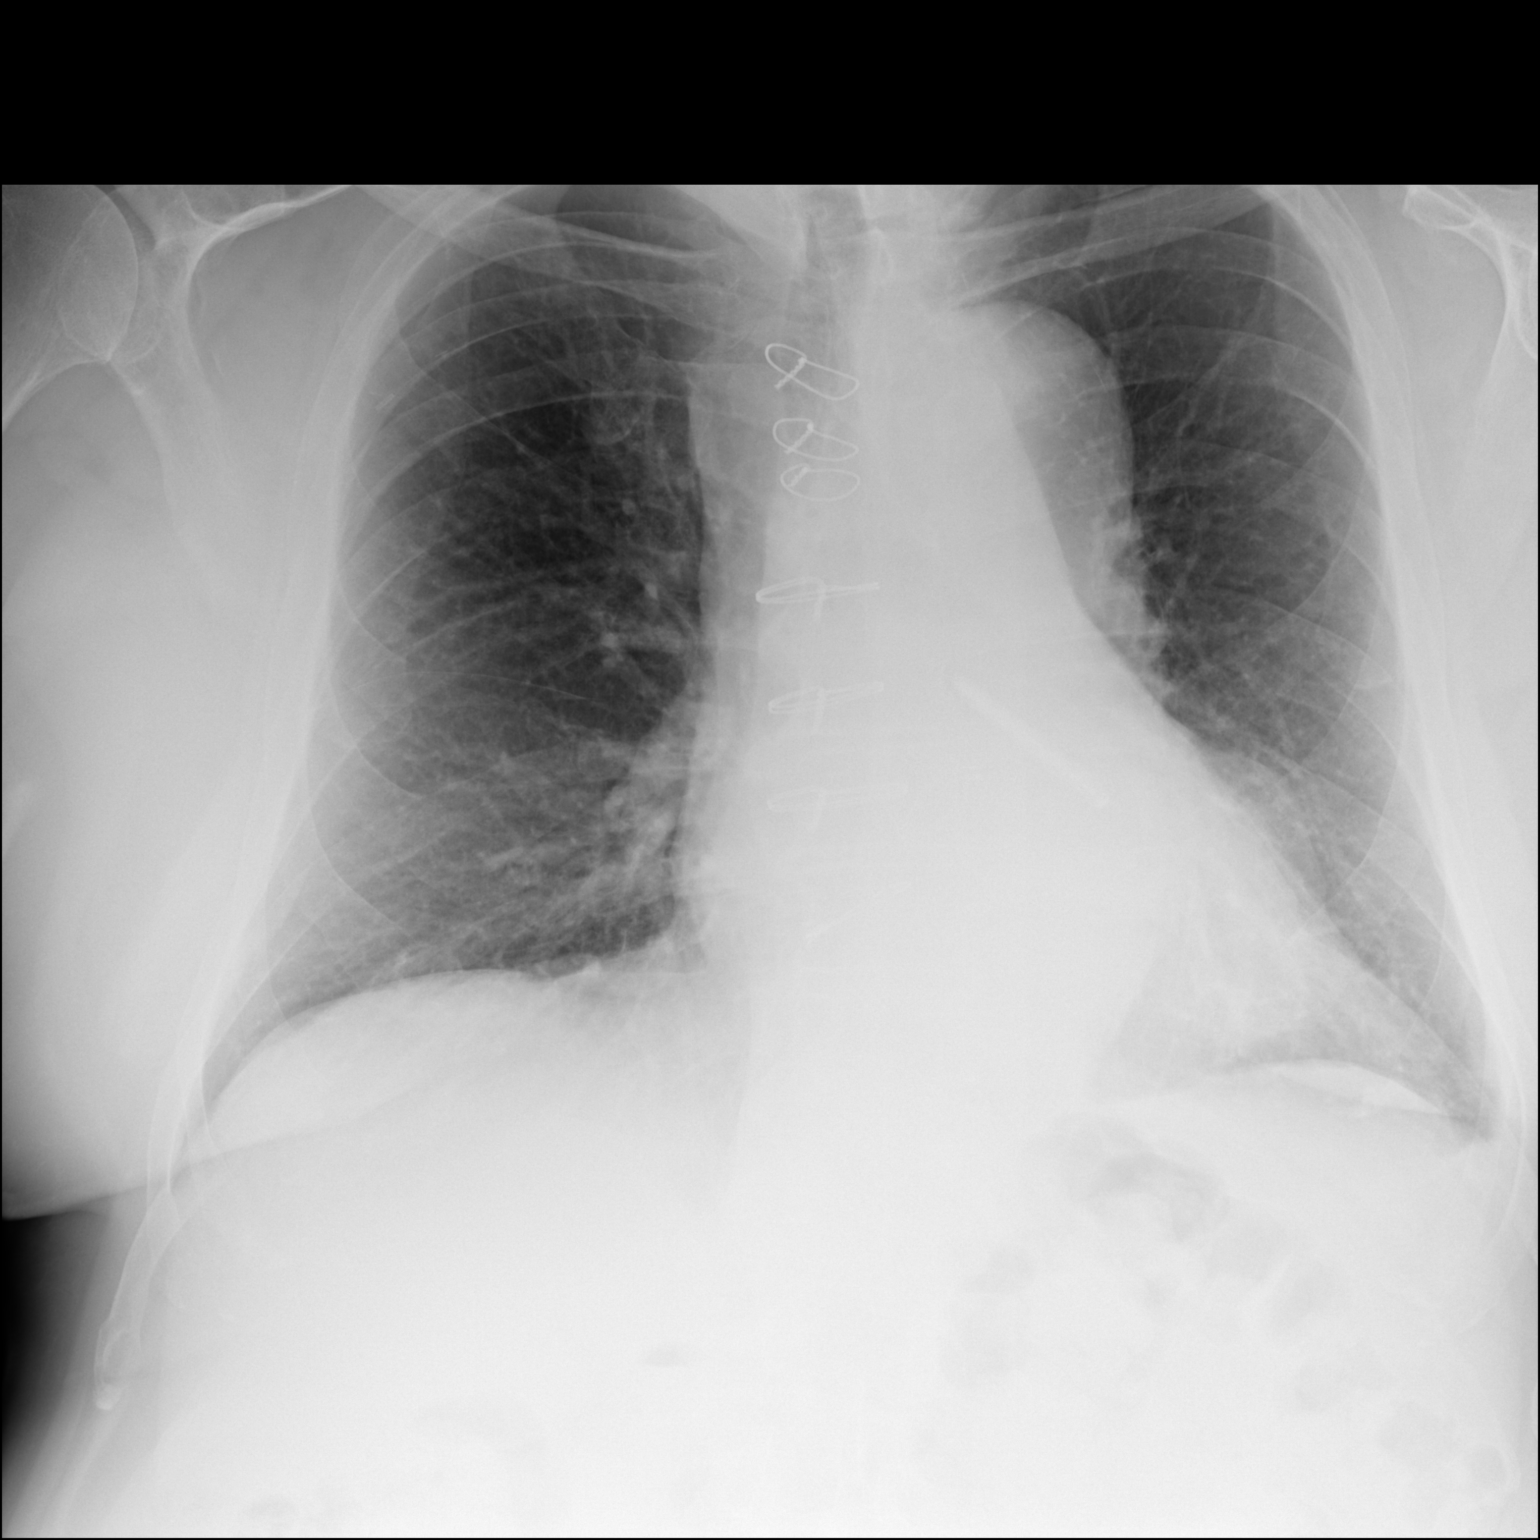

[dg chest 2 view (2 of 2)]
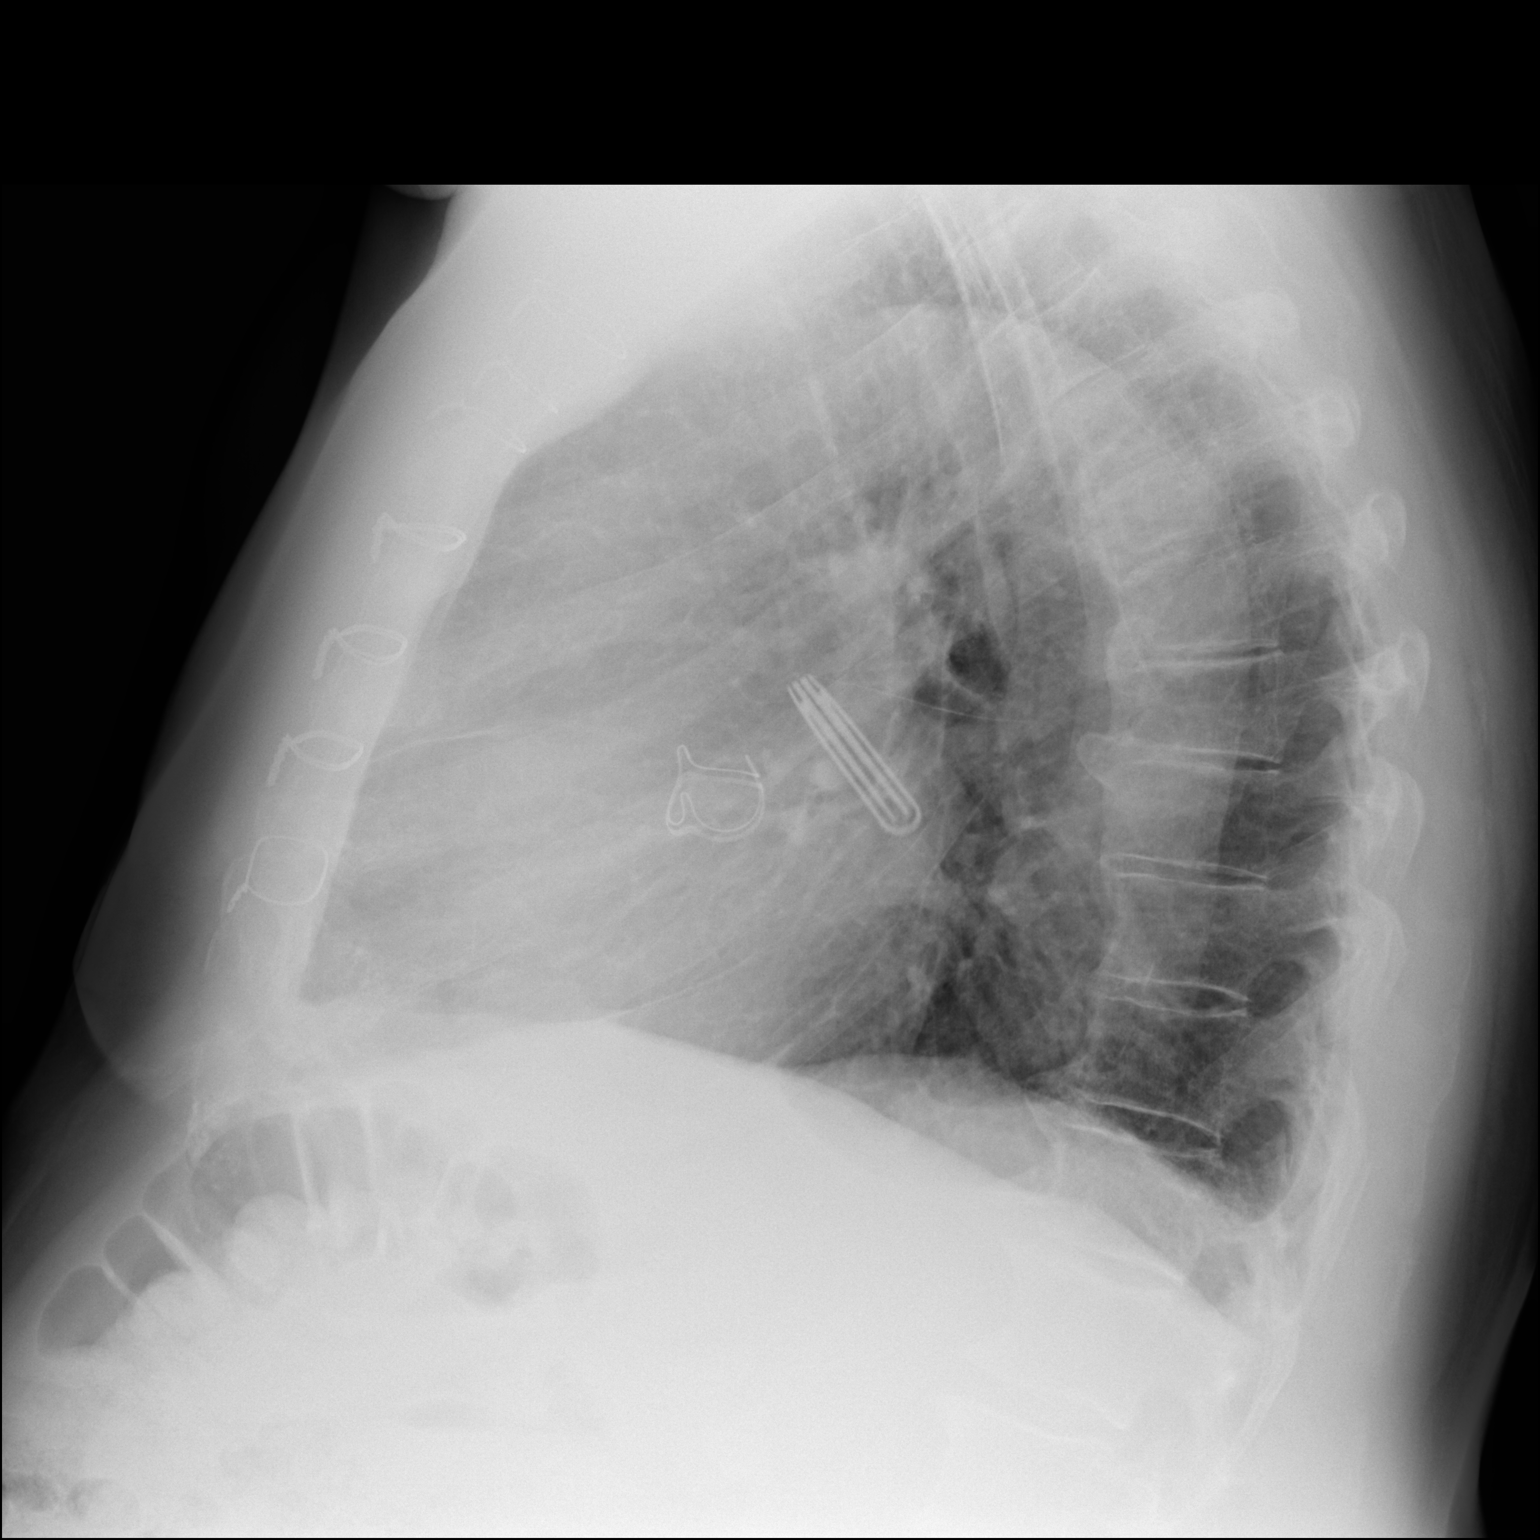

[2 of 2 positions shown; findings below may reference images not displayed]

FINDINGS: Stable heart size and mediastinal contours. Left atrial clipping and
aortic valve replacement. There is no edema, consolidation,
effusion, or pneumothorax.
IMPRESSION: No active cardiopulmonary disease.

## 2019-11-16 NOTE — Telephone Encounter (Addendum)
Left a voice message for the patient to give me a call back at the office.  ----- Message from [place], [place] sent at 11/14/2019  8:14 AM EDT ----- Anemia has improved, renal function down suggestive of dehydration consistent with exam during office visit. He was instructed by me during last visit's office visit to stop lasix and potassium. Renal function likely will improve in the next few days, recommend repeat BMET in 1 week

## 2019-11-16 NOTE — Progress Notes (Signed)
[address]       [place] [address]             [phone]      Clinton Black [facility] Medical Record [id number] Date of Birth: 1945/08/12  Referring: [name], MD Primary Care: [name], MD Primary Cardiologist: [name], MD   Chief Complaint:   POST OP FOLLOW UP OPERATIVE REPORT DATE OF PROCEDURE:  10/16/2019 PREOPERATIVE DIAGNOSES:   1.  Severe aortic insufficiency. 2.  Dilated ascending aorta.   3.  History of atrial fibrillation. POSTOPERATIVE DIAGNOSES:   1.  Severe aortic insufficiency. 2.  Dilated ascending aorta.   3.  History of atrial fibrillation. SURGICAL PROCEDURES:   1.  Replacement of aortic valve with 25 mm pericardial tissue valve, [employer], model 3300TFX, serial [id number]. 2.  Supracoronary replacement of ascending aorta with 34 mm Hemashield platinum graft. 3.  Modified maze procedure with bilateral pulmonary vein isolation. 4.  Placement of a 50 mm AtriCure AtriClip, left atrial appendage.  5.  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   History of Present Illness:     Patient doing well postoperatively, he notes that his shortness of breath has improved.  He is unaware if he is having any atrial fibrillation.  He has been walking approximately half a mile a day, notes the first part of this is slightly uphill, but after walking uphill has enough reserve to continue.  He notes this is improvement from his preop status.    Past Medical History:  Diagnosis Date  . Arthritis   . Atrial fibrillation ([place])   . BPH (benign prostatic hyperplasia)   . Diabetes mellitus type 2 in obese ([facility])    diet controlled  . HTN (hypertension)   . Hyperlipidemia, mixed   . Insomnia   . Obesity   . Thoracic aortic aneurysm, without rupture ([facility])    4.7 cm per chest ct with contrast 11-04-17     Social History   Tobacco Use  Smoking Status Former Smoker  . Packs/day: 1.50   . Years: 5.00  . Pack years: 7.50  . Types: Cigarettes  . Quit date: 06/15/1994  . Years since quitting: 25.4  Smokeless Tobacco Never Used    Social History   Substance and Sexual Activity  Alcohol Use Not Currently  . Alcohol/week: 14.0 standard drinks  . Types: 14 Shots of liquor per week   Comment: 1 drink of liquor per day     Allergies  Allergen Reactions  . Quinolones Other (See Comments)    Patient was warned about not using Cipro and similar antibiotics. Recent studies have raised concern that fluoroquinolone antibiotics could be associated with an increased risk of aortic aneurysm Fluoroquinolones have non-antimicrobial properties that might jeopardise the integrity of the extracellular matrix of the vascular wall In a  propensity score matched cohort study in [place], there was a 66% increased rate of aortic aneurysm or dissection associated with oral fluoroquinolone use, compared wit    Current Outpatient Medications  Medication Sig Dispense Refill  . acetaminophen (TYLENOL) 325 MG tablet Take 2 tablets (650 mg total) by mouth every 6 (six) hours as needed for mild pain.    Marland Kitchen alfuzosin (UROXATRAL) 10 MG 24 hr tablet Take 1 tablet (10 mg total) by mouth daily with breakfast. 90 tablet 1  . amiodarone (PACERONE) 200 MG tablet Take 1 tablet (200 mg total) by mouth daily. [place]  tablet 2  . aspirin EC 81 MG tablet Take 1 tablet (81 mg total) by mouth daily. 30 tablet 1  . atorvastatin (LIPITOR) 20 MG tablet TAKE 1 TABLET BY MOUTH EVERYDAY AT BEDTIME (Patient taking differently: Take 20 mg by mouth daily. ) 90 tablet 1  . Empagliflozin-metFORMIN HCl ER (SYNJARDY XR) 25-1000 MG TB24 Take 1 tablet by mouth daily. 90 tablet 1  . hydrocortisone cream 1 % Apply 1 application topically daily as needed (rash).    . Menthol-Methyl Salicylate (SALONPAS PAIN RELIEF PATCH EX) Apply 1 patch topically daily as needed (back pain).    . metoprolol tartrate (LOPRESSOR) 25 MG tablet Take 0.5  tablets (12.5 mg total) by mouth 2 (two) times daily. 30 tablet 1  . spironolactone (ALDACTONE) 25 MG tablet TAKE 0.5 TABLETS (12.5 MG TOTAL) BY MOUTH AT BEDTIME. 45 tablet 0  . Tiotropium Bromide Monohydrate (SPIRIVA RESPIMAT) 1.25 MCG/ACT AERS Inhale 2 puffs into the lungs daily. 12 g 1  . triazolam (HALCION) 0.25 MG tablet TAKE 1 TABLET (0.25 MG TOTAL) BY MOUTH AT BEDTIME AS NEEDED FOR SLEEP. 30 tablet 3  . XARELTO 20 MG TABS tablet TAKE 1 TABLET (20 MG TOTAL) BY MOUTH DAILY WITH SUPPER. (Patient taking differently: Take 20 mg by mouth daily with supper. ) 90 tablet 1   No current facility-administered medications for this visit.       Physical Exam: BP 104/69 (BP Location: Right Arm, Patient Position: Sitting, Cuff Size: Large)   Pulse 75   Temp (!) 97.5 F (36.4 C)   Resp (!) 22   Ht 6\' 5"  (1.956 m)   Wt 293 lb 12.8 oz (133.3 kg)   SpO2 95% Comment: RA  BMI 34.84 kg/m   General appearance: alert, cooperative and no distress Neurologic: intact Heart: regular rate and rhythm, S1, S2 normal, no murmur, click, rub or gallop Lungs: clear to auscultation bilaterally Abdomen: soft, non-tender; bowel sounds normal; no masses,  no organomegaly Extremities: extremities normal, atraumatic, no cyanosis or edema and Homans sign is negative, no sign of DVT Wound: Sternum stable well-healed, right axillary cannulation site has a small retained suture which was removed but otherwise healing well   Diagnostic Studies & Laboratory data:     Recent Radiology Findings:   DG Chest 2 View  Result Date: 11/16/2019 CLINICAL DATA:  Ascending aorta repair EXAM: CHEST - 2 VIEW COMPARISON:  10/19/2019 FINDINGS: Stable heart size and mediastinal contours. Left atrial clipping and aortic valve replacement. There is no edema, consolidation, effusion, or pneumothorax. IMPRESSION: No active cardiopulmonary disease. Electronically Signed   By: Monte Fantasia M.D.   On: 11/16/2019 10:32    I have  independently reviewed the above radiology studies  and reviewed the findings with the patient.    Recent Lab Findings: Lab Results  Component Value Date   WBC 8.7 11/10/2019   HGB 13.3 11/10/2019   HCT 41.3 11/10/2019   PLT 253 11/10/2019   GLUCOSE 109 (H) 11/10/2019   CHOL 148 05/29/2019   TRIG 255.0 (H) 05/29/2019   HDL 34.00 (L) 05/29/2019   LDLDIRECT 78.0 05/29/2019   LDLCALC 48 06/01/2018   ALT 26 10/12/2019   AST 20 10/12/2019   NA 139 11/10/2019   K 4.6 11/10/2019   CL 102 11/10/2019   CREATININE 1.53 (H) 11/10/2019   BUN 36 (H) 11/10/2019   CO2 19 (L) 11/10/2019   TSH 4.29 09/06/2019   INR 1.4 (H) 10/16/2019   HGBA1C 6.3 (H) 10/12/2019  Assessment / Plan:   #1 status post aortic valve replacement, replacement of ascending aorta, maze procedure with pulmonary vein isolation, atrial clip-for significant aortic insufficiency.  Overall the patient is making very good progress postoperatively.  Symptomatically he is much improved.    Plan see the patient back  6 weeks   I have reviewed with him the need for periprocedural antibiotics with his artificial heart valve.  He notes that he needs a tooth extraction from a crown that broke last week, but this is nonurgent and he will wait 3 months after surgery.  He is aware of the need to contact cardiology about his Eliquis and is aware of the need of periprocedural biotics  He has a prostate biopsy scheduled for later in June, he already has his periprocedural antibiotics for this intervention and is coordinating the Eliquis with cardiology.  Medication Changes: No orders of the defined types were placed in this encounter.     [name] MD      [address] [place] [address] Office [phone]     11/16/2019 2:12 PM

## 2019-11-17 ENCOUNTER — Other Ambulatory Visit: Payer: Self-pay

## 2019-11-17 DIAGNOSIS — Z79899 Other long term (current) drug therapy: Secondary | ICD-10-CM

## 2019-11-24 ENCOUNTER — Other Ambulatory Visit: Payer: Self-pay | Admitting: Internal Medicine

## 2019-11-24 ENCOUNTER — Other Ambulatory Visit: Payer: Self-pay | Admitting: Cardiology

## 2019-11-24 DIAGNOSIS — N400 Enlarged prostate without lower urinary tract symptoms: Secondary | ICD-10-CM

## 2019-11-28 DIAGNOSIS — Z952 Presence of prosthetic heart valve: Secondary | ICD-10-CM | POA: Diagnosis not present

## 2019-12-01 ENCOUNTER — Ambulatory Visit (HOSPITAL_COMMUNITY): Payer: Medicare HMO | Attending: Cardiology

## 2019-12-01 ENCOUNTER — Other Ambulatory Visit: Payer: Self-pay

## 2019-12-01 ENCOUNTER — Other Ambulatory Visit (HOSPITAL_COMMUNITY): Payer: Self-pay | Admitting: Cardiothoracic Surgery

## 2019-12-01 ENCOUNTER — Other Ambulatory Visit (HOSPITAL_COMMUNITY): Payer: Self-pay | Admitting: Internal Medicine

## 2019-12-01 DIAGNOSIS — Z952 Presence of prosthetic heart valve: Secondary | ICD-10-CM

## 2019-12-04 ENCOUNTER — Encounter: Payer: Self-pay | Admitting: Internal Medicine

## 2019-12-05 ENCOUNTER — Telehealth: Payer: Self-pay | Admitting: Internal Medicine

## 2019-12-05 ENCOUNTER — Other Ambulatory Visit: Payer: Self-pay | Admitting: Internal Medicine

## 2019-12-05 NOTE — Telephone Encounter (Signed)
    Patient wants to know if Xarelto and Aspirin can be held  5 days prior to prostate biopsy (Dr Arman Bogus [phone])

## 2019-12-06 NOTE — Telephone Encounter (Signed)
Per PCP - stop xarelto and asa 5 days prior to procedure.   Pt contacted and informed of same.   [facility] and informed of same.   They will call if they have any questions.

## 2019-12-06 NOTE — Telephone Encounter (Signed)
Clinton Black with [facility] called and requested a letter stating the same.   Letter completed and faxed to [place] [phone]

## 2019-12-09 ENCOUNTER — Other Ambulatory Visit: Payer: Self-pay | Admitting: Physician Assistant

## 2019-12-12 DIAGNOSIS — R972 Elevated prostate specific antigen [PSA]: Secondary | ICD-10-CM | POA: Diagnosis not present

## 2019-12-14 ENCOUNTER — Other Ambulatory Visit: Payer: Self-pay | Admitting: Cardiology

## 2019-12-20 ENCOUNTER — Other Ambulatory Visit: Payer: Self-pay | Admitting: Cardiothoracic Surgery

## 2019-12-20 DIAGNOSIS — Z95828 Presence of other vascular implants and grafts: Secondary | ICD-10-CM

## 2019-12-28 ENCOUNTER — Ambulatory Visit
Admission: RE | Admit: 2019-12-28 | Discharge: 2019-12-28 | Disposition: A | Discharge status: Home / Self Care | Payer: Medicare HMO | Source: Ambulatory Visit | Attending: Cardiothoracic Surgery | Admitting: Cardiothoracic Surgery

## 2019-12-28 ENCOUNTER — Ambulatory Visit (INDEPENDENT_AMBULATORY_CARE_PROVIDER_SITE_OTHER): Payer: Self-pay | Admitting: Cardiothoracic Surgery

## 2019-12-28 ENCOUNTER — Other Ambulatory Visit: Payer: Self-pay

## 2019-12-28 VITALS — BP 114/76 | HR 73 | Temp 97.3°F | Resp 24 | Ht 77.0 in | Wt 293.0 lb

## 2019-12-28 DIAGNOSIS — I712 Thoracic aortic aneurysm, without rupture, unspecified: Secondary | ICD-10-CM

## 2019-12-28 DIAGNOSIS — I4819 Other persistent atrial fibrillation: Secondary | ICD-10-CM

## 2019-12-28 DIAGNOSIS — I351 Nonrheumatic aortic (valve) insufficiency: Secondary | ICD-10-CM

## 2019-12-28 DIAGNOSIS — Z95828 Presence of other vascular implants and grafts: Secondary | ICD-10-CM

## 2019-12-28 DIAGNOSIS — Z952 Presence of prosthetic heart valve: Secondary | ICD-10-CM | POA: Diagnosis not present

## 2019-12-28 DIAGNOSIS — I517 Cardiomegaly: Secondary | ICD-10-CM

## 2019-12-28 IMAGING — CR DG CHEST 2V
2 series · 2 of 2 positions shown · non-contrast
Comparison: Chest x-ray dated [DATE].

CLINICAL DATA: Status post AVR and ascending thoracic aortic
aneurysm repair.

EXAM:
CHEST - 2 VIEW

[w chest pa *]
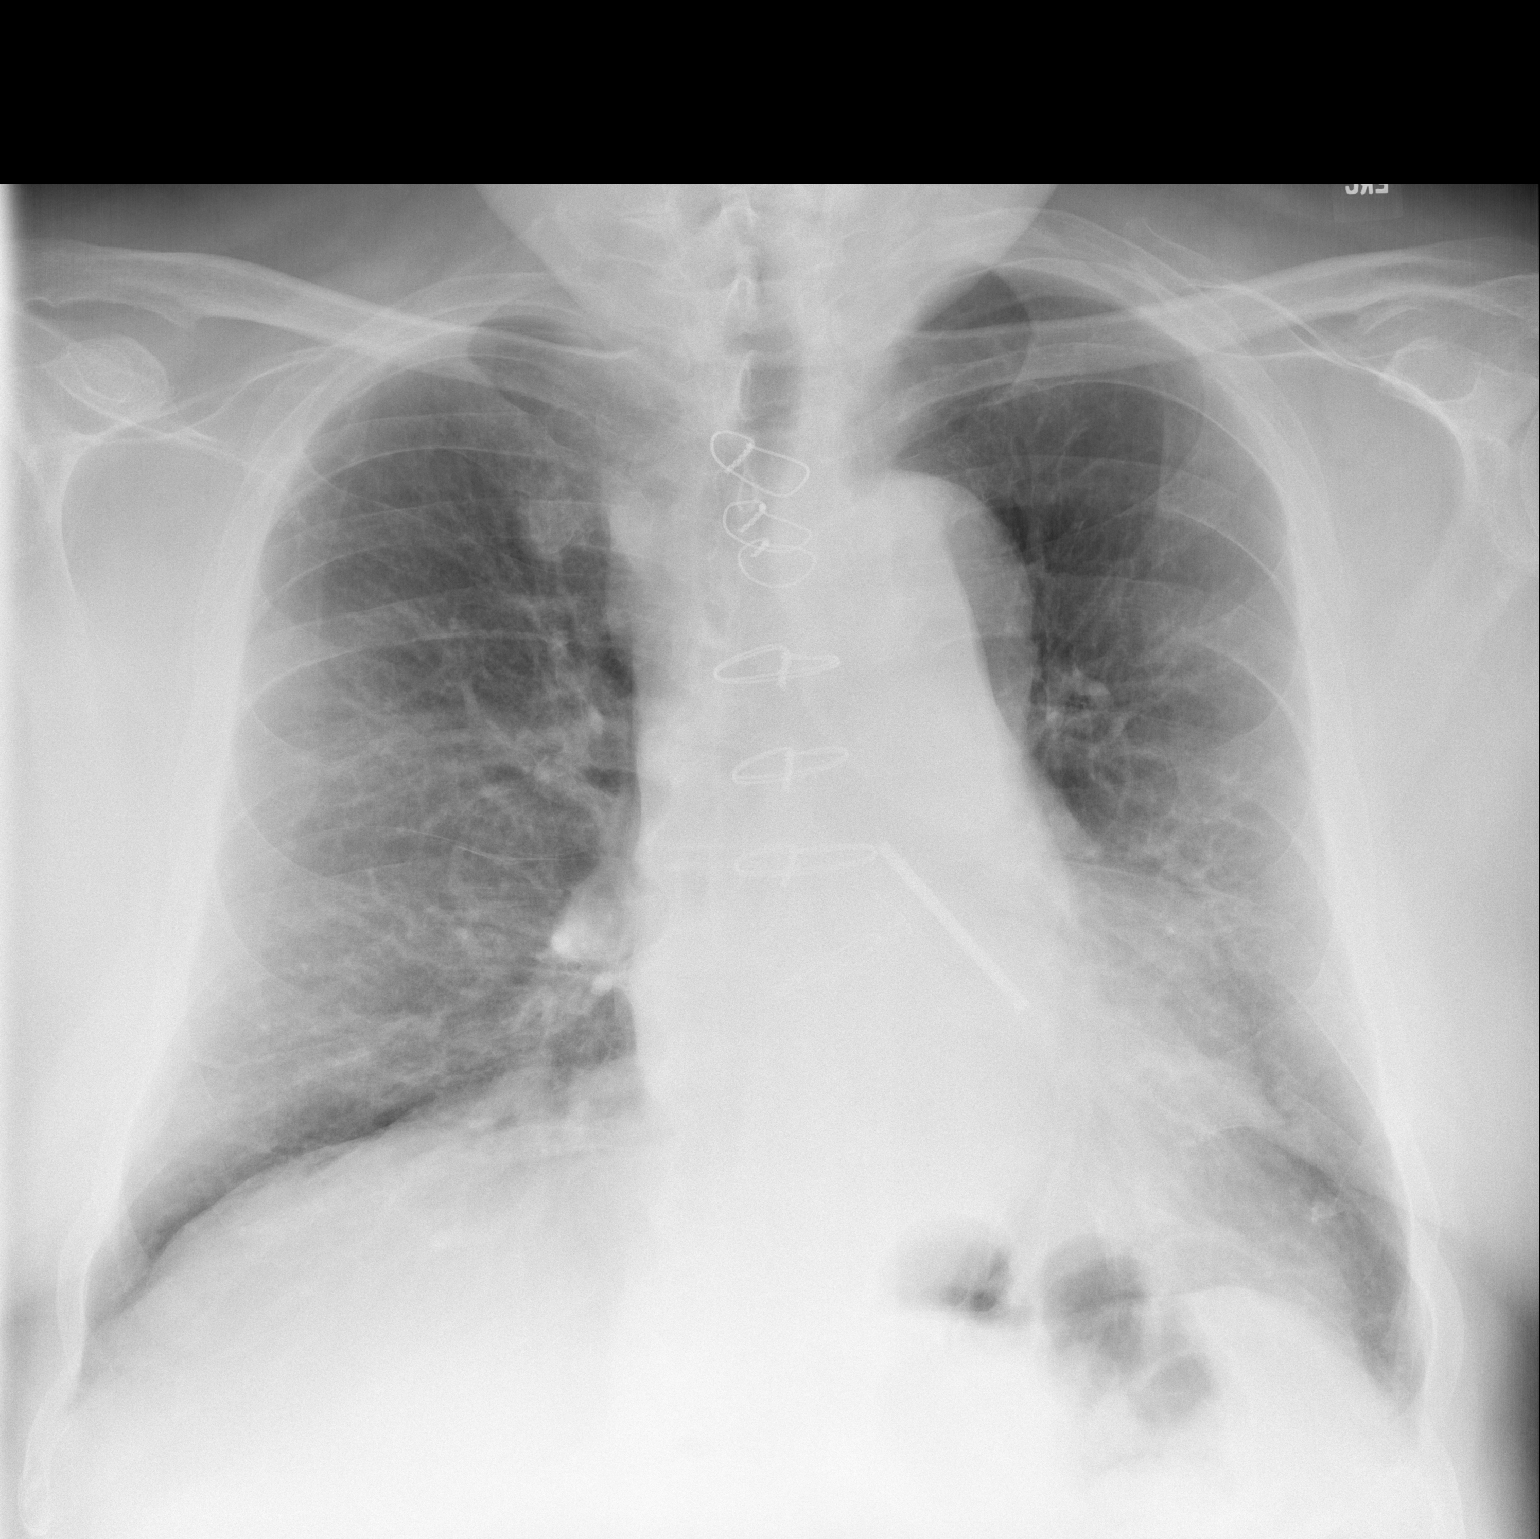

[w chest lat *]
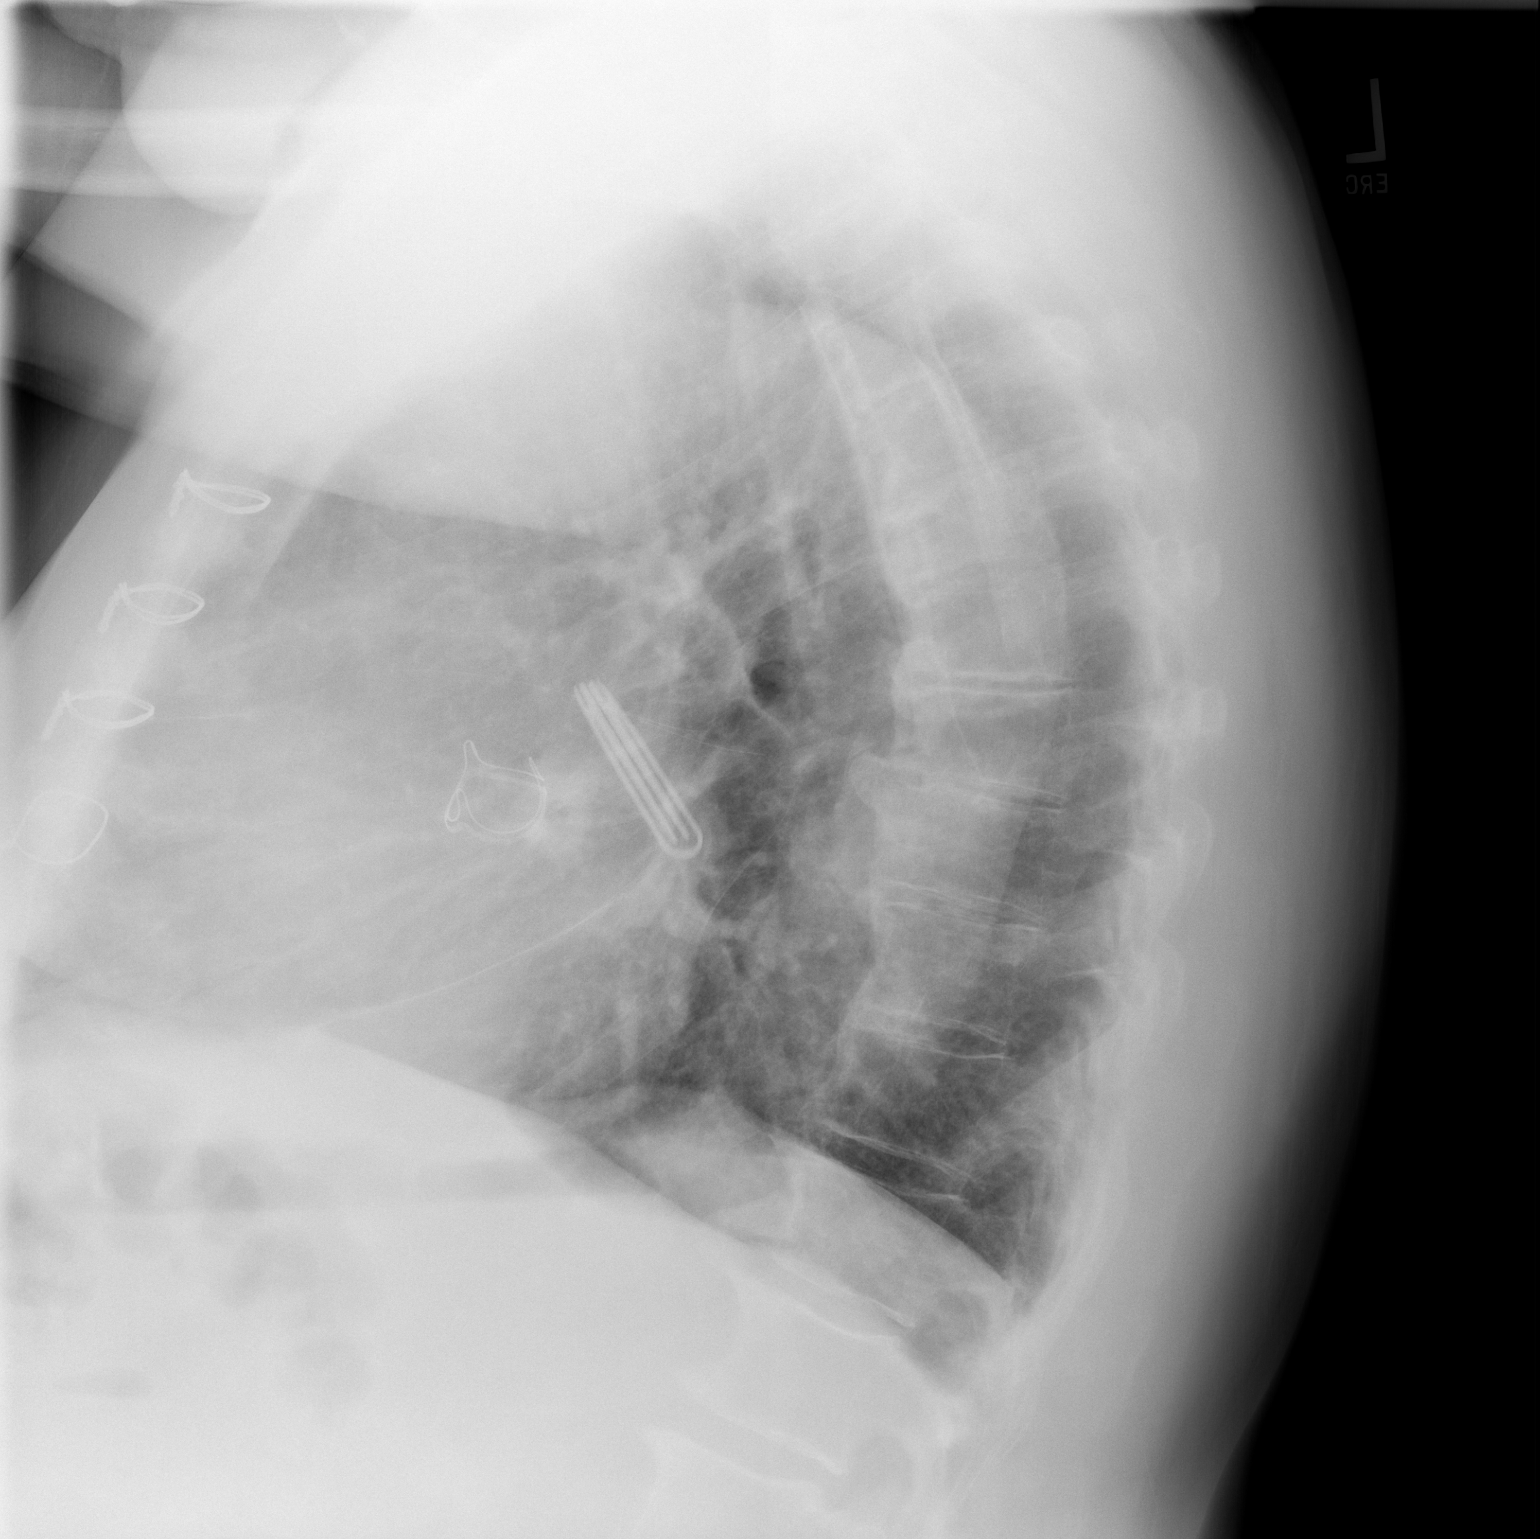

[2 of 2 positions shown; findings below may reference images not displayed]

FINDINGS: Stable cardiomediastinal silhouette status post AVR and left atrial
appendage clipping. Normal pulmonary vascularity. No focal
consolidation, pleural effusion, or pneumothorax. No acute osseous
abnormality.
IMPRESSION: No active cardiopulmonary disease.

## 2019-12-28 NOTE — Progress Notes (Signed)
[address]       [place], [address]             [phone]      Clinton Black Medical Record #[id number] Date of Birth: [birth year]  Referring: [name], MD Primary Care: [name], MD Primary Cardiologist: [name], MD   Chief Complaint:   POST OP FOLLOW UP OPERATIVE REPORT DATE OF PROCEDURE:  10/16/2019 PREOPERATIVE DIAGNOSES:   1.  Severe aortic insufficiency. 2.  Dilated ascending aorta.   3.  History of atrial fibrillation. POSTOPERATIVE DIAGNOSES:   1.  Severe aortic insufficiency. 2.  Dilated ascending aorta.   3.  History of atrial fibrillation. SURGICAL PROCEDURES:   1.  Replacement of aortic valve with 25 mm pericardial tissue valve, Edwards Lifesciences, model 3300TFX, serial [id number]. 2.  Supracoronary replacement of ascending aorta with 34 mm Hemashield platinum graft. 3.  Modified maze procedure with bilateral pulmonary vein isolation. 4.  Placement of a 50 mm AtriCure AtriClip, left atrial appendage.  5.  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   History of Present Illness:     Clinton Black returns to the office today in follow-up after recent aortic valve replacement and replacement of dilated ascending aorta with maze and atrial clip.  The Clinton Black notes over the past 2 months she has continued to be improved following her surgery.  Clinton Black is unaware of any further atrial fibrillation.  Notes that overall Clinton Black is much improved especially with his physical exertion without becoming short of breath.        Past Medical History:  Diagnosis Date  . Arthritis   . Atrial fibrillation ([place])   . BPH (benign prostatic hyperplasia)   . Diabetes mellitus type 2 in obese (HCC)    diet controlled  . HTN (hypertension)   . Hyperlipidemia, mixed   . Insomnia   . Obesity   . Thoracic aortic aneurysm, without rupture (HCC)    4.7 cm per chest ct with contrast 11-04-17     Social  History   Tobacco Use  Smoking Status Former Smoker  . Packs/day: 1.50  . Years: 5.00  . Pack years: 7.50  . Types: Cigarettes  . Quit date: 06/15/1994  . Years since quitting: 25.5  Smokeless Tobacco Never Used    Social History   Substance and Sexual Activity  Alcohol Use Not Currently  . Alcohol/week: 14.0 standard drinks  . Types: 14 Shots of liquor per week   Comment: 1 drink of liquor per day     Allergies  Allergen Reactions  . Quinolones Other (See Comments)    Clinton Black was warned about not using Cipro and similar antibiotics. Recent studies have raised concern that fluoroquinolone antibiotics could be associated with an increased risk of aortic aneurysm Fluoroquinolones have non-antimicrobial properties that might jeopardise the integrity of the extracellular matrix of the vascular wall In a  propensity score matched cohort study in [place], there was a 66% increased rate of aortic aneurysm or dissection associated with oral fluoroquinolone use, compared wit    Current Outpatient Medications  Medication Sig Dispense Refill  . alfuzosin (UROXATRAL) 10 MG 24 hr tablet TAKE 1 TABLET (10 MG TOTAL) BY MOUTH DAILY WITH BREAKFAST. 90 tablet 1  . amiodarone (PACERONE) 200 MG tablet Take 1 tablet (200 mg total) by mouth daily. 90 tablet 2  . atorvastatin (LIPITOR) 20 MG tablet TAKE 1 TABLET BY MOUTH EVERYDAY AT  BEDTIME (Clinton Black taking differently: Take 20 mg by mouth daily. ) 90 tablet 1  . Empagliflozin-metFORMIN HCl ER (SYNJARDY XR) 25-1000 MG TB24 Take 1 tablet by mouth daily. 90 tablet 1  . hydrocortisone cream 1 % Apply 1 application topically daily as needed (rash).    . Menthol-Methyl Salicylate (SALONPAS PAIN RELIEF PATCH EX) Apply 1 patch topically daily as needed (back pain).    . metoprolol tartrate (LOPRESSOR) 25 MG tablet TAKE 0.5 TABLETS (12.5 MG TOTAL) BY MOUTH 2 (TWO) TIMES DAILY. 30 tablet 5  . spironolactone (ALDACTONE) 25 MG tablet TAKE 0.5 TABLETS (12.5 MG  TOTAL) BY MOUTH AT BEDTIME. 45 tablet 1  . Tiotropium Bromide Monohydrate (SPIRIVA RESPIMAT) 1.25 MCG/ACT AERS Inhale 2 puffs into the lungs daily. 12 g 1  . triazolam (HALCION) 0.25 MG tablet TAKE 1 TABLET (0.25 MG TOTAL) BY MOUTH AT BEDTIME AS NEEDED FOR SLEEP. 30 tablet 3  . XARELTO 20 MG TABS tablet TAKE 1 TABLET (20 MG TOTAL) BY MOUTH DAILY WITH SUPPER. (Clinton Black taking differently: Take 20 mg by mouth daily with supper. ) 90 tablet 1   No current facility-administered medications for this visit.       Physical Exam: BP 114/76 (BP Location: Left Arm, Clinton Black Position: Sitting, Cuff Size: Normal)   Pulse 73   Temp (!) 97.3 F (36.3 C) (Temporal)   Resp (!) 24   Ht 6\' 5"  (1.956 m)   Wt 293 lb (132.9 kg)   SpO2 95% Comment: RA  BMI 34.74 kg/m  General appearance: alert, cooperative, appears stated age and no distress Head: Normocephalic, without obvious abnormality, atraumatic Neck: no adenopathy, no carotid bruit, no JVD, supple, symmetrical, trachea midline and thyroid not enlarged, symmetric, no tenderness/mass/nodules Resp: clear to auscultation bilaterally Cardio: regular rate and rhythm, S1, S2 normal, no murmur, click, rub or gallop GI: soft, non-tender; bowel sounds normal; no masses,  no organomegaly Extremities: Venous stasis ulcer on the anterior left lower leg is smaller in size than last month Clinton Black notes Clinton Black continues actively treated with Silvadene Neurologic: Grossly normal  Diagnostic Studies & Laboratory data:     Recent Radiology Findings:   DG Chest 2 View  Result Date: 12/28/2019 CLINICAL DATA:  Status post AVR and ascending thoracic aortic aneurysm repair. EXAM: CHEST - 2 VIEW COMPARISON:  Chest x-ray dated November 16, 2019. FINDINGS: Stable cardiomediastinal silhouette status post AVR and left atrial appendage clipping. Normal pulmonary vascularity. No focal consolidation, pleural effusion, or pneumothorax. No acute osseous abnormality. IMPRESSION: No active  cardiopulmonary disease. Electronically Signed   By: Titus Dubin M.D.   On: 12/28/2019 11:11    I have independently reviewed the above radiology studies  and reviewed the findings with the Clinton Black.    Recent Lab Findings: Lab Results  Component Value Date   WBC 8.7 11/10/2019   HGB 13.3 11/10/2019   HCT 41.3 11/10/2019   PLT 253 11/10/2019   GLUCOSE 109 (H) 11/10/2019   CHOL 148 05/29/2019   TRIG 255.0 (H) 05/29/2019   HDL 34.00 (L) 05/29/2019   LDLDIRECT 78.0 05/29/2019   LDLCALC 48 06/01/2018   ALT 26 10/12/2019   AST 20 10/12/2019   NA 139 11/10/2019   K 4.6 11/10/2019   CL 102 11/10/2019   CREATININE 1.53 (H) 11/10/2019   BUN 36 (H) 11/10/2019   CO2 19 (L) 11/10/2019   TSH 4.29 09/06/2019   INR 1.4 (H) 10/16/2019   HGBA1C 6.3 (H) 10/12/2019   Rhythm strip in office confirms  sinus rhythm Echocardiogram done last month showed good functioning of the aortic valve prosthesis  Assessment / Plan:   #1 status post aortic valve replacement, replacement of ascending aorta, maze procedure with pulmonary vein isolation, atrial clip-for significant aortic insufficiency.  Overall the Clinton Black is making very good progress postoperatively.  Symptomatically Clinton Black is much improved.  Clinton Black appears to be in sinus rhythm on exam today.  Plan see back in 6 months with a follow-up CTA of the chest   Medication Changes: No orders of the defined types were placed in this encounter.     [name] MD      [address] [place] [address] Office [phone]     12/28/2019 12:07 PM

## 2019-12-28 NOTE — Patient Instructions (Signed)

## 2019-12-28 NOTE — Progress Notes (Signed)
Cardiology Office Note:    Date:  01/01/2020   ID:  Clinton Black, Clinton Black May 08, 1946, MRN [id number]  PCP:  [name], MD  Cardiologist:   [name], MD  Electrophysiologist:  [name], MD   Referring MD: [name], MD   Chief Complaint  Patient presents with  . Follow-up    History of Present Illness:    Clinton Black is a 74 y.o. male with a hx of HTN, HLD, DM II, TAA, morbid obesity and persistent atrial fibrillation.  Previous Myoview in August 2018 showed EF 39%, no ischemia, intermediate risk due to reduced LV function.  Echocardiogram obtained around the same time showed EF 55%.  Repeat echocardiogram in October 2019 showed EF 50 to 55%, grade 1 DD, dilated thoracic aorta measuring at 5 cm, moderate AI.  He was diagnosed with atrial fibrillation in December 2019 and started on Xarelto.  He underwent a successful cardioversion in January 2020.  Unfortunately he quickly went back into atrial fibrillation afterward.  Even though he was loaded on amiodarone, subsequent cardioversion was unsuccessful.  Amiodarone was subsequently discontinued.  He returned to the ED in March 2020 with facial droop secondary to Bell's palsy.  MRI was negative for CVA.  Cardiac catheterization performed in September 2020 showed normal coronary anatomy, moderate AI.  He eventually underwent A. fib ablation by Dr. Curt Bears on 04/07/2019.  Postprocedure, he felt better and breathing has improved.  Unfortunately he had recurrence of atrial fibrillation by December 2020 and was placed back on amiodarone therapy.  He underwent a DC cardioversion in January 2021.  Due to the enlarging aortic root, patient eventually underwent planned AVR and aortic graft by Dr. Pia Mau on 10/16/2019 using a Edwards [employer] magna ease 25 mm bioprosthetic aortic valve and a 34 mm graft for the ascending aorta.  He also underwent a modified maze procedure with bilateral pulmonary vein isolation with RF ablation,  clipping of the left atrial appendage.  Postop course was uneventful, he was eventually discharged on 10/23/2019.  On follow up today he is doing well. He denies any dyspnea, chest pain or palpitations. He has been treating a chronic venous ulcer on his right leg and it is getting better. CXR in April looked good. Recent Echo in June showed normal AV prosthetic function and normal EF. No edema. He want to start back playing golf. He is planning to start Cardiac Rehab tomorrow.    Past Medical History:  Diagnosis Date  . Arthritis   . Atrial fibrillation ([place])   . BPH (benign prostatic hyperplasia)   . Diabetes mellitus type 2 in obese (HCC)    diet controlled  . HTN (hypertension)   . Hyperlipidemia, mixed   . Insomnia   . Obesity   . Thoracic aortic aneurysm, without rupture (HCC)    4.7 cm per chest ct with contrast 11-04-17    Past Surgical History:  Procedure Laterality Date  . AORTIC VALVE REPLACEMENT N/A 10/16/2019   Procedure: AORTIC VALVE REPLACEMENT (AVR) using Edwards PERIMOUNT Magna Ease 25MM Bioprosthesis Aortic Valve.;  Surgeon: [name], MD;  Location: [place];  Service: Open Heart Surgery;  Laterality: N/A;  Right subclavian artery cannulation.  . AORTIC VALVE REPLACEMENT  10/16/2019   AORTIC VALVE REPLACEMENT (AVR)   . ATRIAL FIBRILLATION ABLATION N/A 04/07/2019   Procedure: ATRIAL FIBRILLATION ABLATION;  Surgeon: [name], MD;  Location: [facility] CV LAB;  Service: Cardiovascular;  Laterality: N/A;  . CARDIOVERSION N/A  07/12/2018   Procedure: CARDIOVERSION;  Surgeon: Acie Fredrickson Wonda Cheng, MD;  Location: [facility] ENDOSCOPY;  Service: Cardiovascular;  Laterality: N/A;  . CARDIOVERSION N/A 08/22/2018   Procedure: CARDIOVERSION;  Surgeon: Skeet Latch, MD;  Location: [facility] ENDOSCOPY;  Service: Cardiovascular;  Laterality: N/A;  . CARDIOVERSION N/A 06/28/2019   Procedure: CARDIOVERSION;  Surgeon: Acie Fredrickson Wonda Cheng, MD;  Location: [place];  Service:  Cardiovascular;  Laterality: N/A;  . CLIPPING OF ATRIAL APPENDAGE N/A 10/16/2019   Procedure: Clipping Of Atrial Appendage using AtriCure AtriClip Exclusion VLAA 59mm.;  Surgeon: [name], MD;  Location: [place];  Service: Open Heart Surgery;  Laterality: N/A;  . COLONOSCOPY  2016  . CYST EXCISION     polynomial  . CYSTOSCOPY WITH INSERTION OF UROLIFT N/A 04/18/2018   Procedure: CYSTOSCOPY WITH INSERTION OF UROLIFT;  Surgeon: Cleon Gustin, MD;  Location: [facility];  Service: Urology;  Laterality: N/A;  . KNEE SURGERY Right 2000   arthroscopy  . MAZE N/A 10/16/2019   Procedure: MAZE with bilateral pulmonary vein isolation.;  Surgeon: [name], MD;  Location: [place];  Service: Open Heart Surgery;  Laterality: N/A;  Maze procedure using Atricure OLL2 Isolator clamp.  . REPLACEMENT ASCENDING AORTA N/A 10/16/2019   Procedure: SUPRA CORONARY REPLACEMENT OF ASCENDING AORTA using [place] Hemashiel Platinum 34 MM Vascular Graft.;  Surgeon: [name], MD;  Location: [place];  Service: Open Heart Surgery;  Laterality: N/A;  Right subclavian artery cannulation.  Marland Kitchen RIGHT/LEFT HEART CATH AND CORONARY ANGIOGRAPHY N/A 01/30/2019   Procedure: RIGHT/LEFT HEART CATH AND CORONARY ANGIOGRAPHY;  Surgeon: [name],  M, MD;  Location: [place] CV LAB;  Service: Cardiovascular;  Laterality: N/A;  . TEE WITHOUT CARDIOVERSION N/A 10/16/2019   Procedure: TRANSESOPHAGEAL ECHOCARDIOGRAM (TEE);  Surgeon: [name], MD;  Location: [facility];  Service: Open Heart Surgery;  Laterality: N/A;  . TONSILLECTOMY    . widson teeth extraction      Current Medications: Current Meds  Medication Sig  . alfuzosin (UROXATRAL) 10 MG 24 hr tablet TAKE 1 TABLET (10 MG TOTAL) BY MOUTH DAILY WITH BREAKFAST.  Marland Kitchen atorvastatin (LIPITOR) 20 MG tablet TAKE 1 TABLET BY MOUTH EVERYDAY AT BEDTIME (Patient taking differently: Take 20 mg by mouth daily. )  . Empagliflozin-metFORMIN HCl ER (SYNJARDY XR)  25-1000 MG TB24 Take 1 tablet by mouth daily.  . hydrocortisone cream 1 % Apply 1 application topically daily as needed (rash).  . Menthol-Methyl Salicylate (SALONPAS PAIN RELIEF PATCH EX) Apply 1 patch topically daily as needed (back pain).  . metoprolol tartrate (LOPRESSOR) 25 MG tablet TAKE 0.5 TABLETS (12.5 MG TOTAL) BY MOUTH 2 (TWO) TIMES DAILY.  Marland Kitchen spironolactone (ALDACTONE) 25 MG tablet TAKE 0.5 TABLETS (12.5 MG TOTAL) BY MOUTH AT BEDTIME.  . Tiotropium Bromide Monohydrate (SPIRIVA RESPIMAT) 1.25 MCG/ACT AERS Inhale 2 puffs into the lungs daily.  . triazolam (HALCION) 0.25 MG tablet TAKE 1 TABLET (0.25 MG TOTAL) BY MOUTH AT BEDTIME AS NEEDED FOR SLEEP.  Marland Kitchen XARELTO 20 MG TABS tablet TAKE 1 TABLET (20 MG TOTAL) BY MOUTH DAILY WITH SUPPER. (Patient taking differently: Take 20 mg by mouth daily with supper. )  . [DISCONTINUED] amiodarone (PACERONE) 200 MG tablet Take 1 tablet (200 mg total) by mouth daily.     Allergies:   Quinolones   Social History   Socioeconomic History  . Marital status: Single    Spouse name: Not on file  . Number of children: Not on file  .  Years of education: Not on file  . Highest education level: Not on file  Occupational History  . Not on file  Tobacco Use  . Smoking status: Former Smoker    Packs/day: 1.50    Years: 5.00    Pack years: 7.50    Types: Cigarettes    Quit date: 06/15/1994    Years since quitting: 25.5  . Smokeless tobacco: Never Used  Vaping Use  . Vaping Use: Never used  Substance and Sexual Activity  . Alcohol use: Not Currently    Alcohol/week: 14.0 standard drinks    Types: 14 Shots of liquor per week    Comment: 1 drink of liquor per day  . Drug use: Yes    Types: Marijuana    Comment: occasionally marijuana  . Sexual activity: Yes    Partners: Female    Birth control/protection: Condom    Comment: condom use most of the time but not always  Other Topics Concern  . Not on file  [place] - [employer] and [employer]. married '72- 3 years/divorced; married '89- 3 years/divorced;. married '03 - 3 years/divorced. 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   Social Determinants of Health   Financial Resource Strain:   . Difficulty of Paying Living Expenses:   Food Insecurity:   . Worried About Charity fundraiser in the Last Year:   . Arboriculturist in the Last Year:   Transportation Needs:   . Film/video editor (Medical):   Marland Kitchen Lack of Transportation (Non-Medical):   Physical Activity:   . Days of Exercise per Week:   . Minutes of Exercise per Session:   Stress:   . Feeling of Stress :   Social Connections:   . Frequency of Communication with Friends and Family:   . Frequency of Social Gatherings with Friends and Family:   . Attends Religious Services:   . Active Member of Clubs or Organizations:   . Attends Archivist Meetings:   Marland Kitchen Marital Status:      Family History: The patient's family history includes Aneurysm in his father; Arthritis in his mother; Heart disease in his father; Macular degeneration in his mother; Varicose Veins in his mother. There is no history of Cancer, Colon cancer, Esophageal cancer, Rectal cancer, or Stomach cancer.  ROS:   Please see the history of present illness.     All other systems reviewed and are negative.  EKGs/Labs/Other Studies Reviewed:    The following studies were reviewed today:  Echo 07/28/2019 1. Left ventricular ejection fraction, by estimation, is 60%. The left  ventricle has normal function. The left ventricle has no regional wall  motion abnormalities. The left ventricular internal cavity size was mildly  dilated. There is mildly increased  left ventricular hypertrophy. Left ventricular diastolic parameters are  indeterminate.  2. Right ventricular systolic function is normal. The right ventricular  size is normal. Tricuspid regurgitation signal is inadequate for assessing  PA pressure.  3. The mitral valve is normal  in structure and function. No evidence of  mitral valve regurgitation. No evidence of mitral stenosis.  4. The aortic valve is grossly normal. Aortic valve regurgitation is  moderate. No aortic stenosis is present.  5. Aortic dilatation noted. 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  6. The inferior vena cava is normal in size with greater than 50%  respiratory variability, suggesting right atrial pressure of  3 mmHg.   Echo 12/01/19: IMPRESSIONS    1. Left ventricular ejection fraction, by estimation, is 55 to 60%. The  left ventricle has normal function. The left ventricle has no regional  wall motion abnormalities. There is moderate asymmetric left ventricular  hypertrophy of the basal-septal  segment. Left ventricular diastolic parameters are consistent with Grade I  diastolic dysfunction (impaired relaxation).  2. Right ventricular systolic function is normal. The right ventricular  size is normal.  3. Left atrial size was mildly dilated.  4. The mitral valve is normal in structure. Trivial mitral valve  regurgitation. No evidence of mitral stenosis.  5. Normal transaortic gradients, no paravalvular leak. The aortic valve  has been repaired/replaced. Aortic valve regurgitation is not visualized.  No aortic stenosis is present. There is a 25 mm Edwards pericardial tissue  valve present in the aortic  position. Aortic valve mean gradient measures 8.0 mmHg.  6. Aortic root/ascending aorta has been repaired/replaced and S/P  supracoronary replacement of the ascending aorta with a 34 mm Hemashield  platinum graft. There is moderate dilatation of the ascending aorta  measuring 47 mm.  7. The inferior vena cava is normal in size with greater than 50%  respiratory variability, suggesting right atrial pressure of 3 mmHg.   EKG:  EKG is not  ordered today.    Recent Labs: 05/29/2019: Pro B Natriuretic peptide (BNP) 95.0 09/06/2019: TSH  4.29 10/12/2019: ALT 26 10/17/2019: Magnesium 2.1 11/10/2019: BUN 36; Creatinine, Ser 1.53; Hemoglobin 13.3; Platelets 253; Potassium 4.6; Sodium 139  Recent Lipid Panel    Component Value Date/Time   CHOL 148 05/29/2019 1430   CHOL 105 03/11/2017 1541   TRIG 255.0 (H) 05/29/2019 1430   HDL 34.00 (L) 05/29/2019 1430   HDL 36 (L) 03/11/2017 1541   CHOLHDL 4 05/29/2019 1430   VLDL 51.0 (H) 05/29/2019 1430   LDLCALC 48 06/01/2018 1328   LDLCALC 46 03/11/2017 1541   LDLDIRECT 78.0 05/29/2019 1430    Physical Exam:    VS:  BP 116/66   Pulse 68   Ht 6\' 5"  (1.956 m)   Wt 293 lb (132.9 kg)   BMI 34.74 kg/m     Wt Readings from Last 3 Encounters:  01/01/20 293 lb (132.9 kg)  12/28/19 293 lb (132.9 kg)  11/16/19 293 lb 12.8 oz (133.3 kg)     GEN:  Well nourished, well developed in no acute distress HEENT: Normal NECK: No JVD; No carotid bruits LYMPHATICS: No lymphadenopathy CARDIAC: RRR, soft SEM RUSB, rubs, gallops RESPIRATORY:  Clear to auscultation without rales, wheezing or rhonchi  ABDOMEN: Soft, non-tender, non-distended MUSCULOSKELETAL:  No edema; No deformity  SKIN: Warm and dry NEUROLOGIC:  Alert and oriented x 3 PSYCHIATRIC:  Normal affect   ASSESSMENT:    1. S/P AVR (aortic valve replacement) and aortoplasty   2. Persistent atrial fibrillation ([place])   3. Chronic diastolic CHF (congestive heart failure), NYHA class 2 ([place])   4. Thoracic aortic aneurysm without rupture ([place])   5. Essential hypertension   6. Hyperlipidemia LDL goal <70    PLAN:    In order of problems listed above:  1. S/p  AVR and aortoplasty with bioprosthetic valve on 10/16/19: good recovery.   Postprocedure Echo showed good valve function. Normal EF. SBE prophylaxis.   2. Persistent atrial fibrillation: s/p ablation with recurrence. Now s/p MAZE at time of surgery. Maintaining NSR. Recommend stopping amiodarone at this time. If he has recurrence may need to go back  to amiodarone but hopefully  he can do without. Should continue Xarelto.   3. Hypertension: Blood pressure is well controlled  4. Hyperlipidemia: Continue Lipitor  5. DM2: Managed by primary care provider.  6.   Chronic venous ulcer.    Medication Adjustments/Labs and Tests Ordered: Current medicines are reviewed at length with the patient today.  Concerns regarding medicines are outlined above.  No orders of the defined types were placed in this encounter.  No orders of the defined types were placed in this encounter.   There are no Patient Instructions on file for this visit.   Signed,  [name], MD  01/01/2020 11:47 AM    [facility]

## 2019-12-29 DIAGNOSIS — R350 Frequency of micturition: Secondary | ICD-10-CM | POA: Diagnosis not present

## 2019-12-29 DIAGNOSIS — N401 Enlarged prostate with lower urinary tract symptoms: Secondary | ICD-10-CM | POA: Diagnosis not present

## 2019-12-29 DIAGNOSIS — R972 Elevated prostate specific antigen [PSA]: Secondary | ICD-10-CM | POA: Diagnosis not present

## 2020-01-01 ENCOUNTER — Ambulatory Visit (INDEPENDENT_AMBULATORY_CARE_PROVIDER_SITE_OTHER): Payer: Medicare HMO | Admitting: Cardiology

## 2020-01-01 ENCOUNTER — Encounter: Payer: Self-pay | Admitting: Cardiology

## 2020-01-01 ENCOUNTER — Other Ambulatory Visit: Payer: Self-pay

## 2020-01-01 VITALS — BP 116/66 | HR 68 | Ht 77.0 in | Wt 293.0 lb

## 2020-01-01 DIAGNOSIS — I712 Thoracic aortic aneurysm, without rupture, unspecified: Secondary | ICD-10-CM

## 2020-01-01 DIAGNOSIS — Z952 Presence of prosthetic heart valve: Secondary | ICD-10-CM

## 2020-01-01 DIAGNOSIS — I10 Essential (primary) hypertension: Secondary | ICD-10-CM

## 2020-01-01 DIAGNOSIS — E785 Hyperlipidemia, unspecified: Secondary | ICD-10-CM

## 2020-01-01 DIAGNOSIS — I4819 Other persistent atrial fibrillation: Secondary | ICD-10-CM | POA: Diagnosis not present

## 2020-01-01 DIAGNOSIS — I5032 Chronic diastolic (congestive) heart failure: Secondary | ICD-10-CM | POA: Diagnosis not present

## 2020-01-01 NOTE — Patient Instructions (Signed)
Medication Instructions:  Stop Amiodarone Continue all other mendications *If you need a refill on your cardiac medications before your next appointment, please call your pharmacy*   Lab Work: None ordered    Testing/Procedures: None ordered   Follow-Up: At [facility], you and your health needs are our priority.  As part of our continuing mission to provide you with exceptional heart care, we have created designated Provider Care Teams.  These Care Teams include your primary Cardiologist (physician) and Advanced Practice Providers (APPs -  Physician Assistants and Nurse Practitioners) who all work together to provide you with the care you need, when you need it.  We recommend signing up for the patient portal called "MyChart".  Sign up information is provided on this After Visit Summary.  MyChart is used to connect with patients for Virtual Visits (Telemedicine).  Patients are able to view lab/test results, encounter notes, upcoming appointments, etc.  Non-urgent messages can be sent to your provider as well.   To learn more about what you can do with MyChart, go to [email or web address].    Your next appointment:  6 months  Call in Sept to schedule Jan appointment    The format for your next appointment: Office    Provider: Dr.Jordan

## 2020-01-16 NOTE — Chronic Care Management (AMB) (Signed)
Chronic Care Management Pharmacy  Name: Clinton Black  MRN: [id number] DOB: Nov 04, 1945   Chief Complaint/ HPI  Clinton Black,  74 y.o. , male presents for their Initial CCM visit with the clinical pharmacist via telephone due to COVID-19 Pandemic.  PCP : [name], MD Patient Care Team: [name], MD as PCP - General (Internal Medicine) [name], [name], MD as PCP - Cardiology (Cardiology) Clinton Haw, MD as PCP - Electrophysiology (Cardiology) Clinton Black, Clinton Black as Pharmacist (Pharmacist)  Their chronic conditions include: Hypertension, Hyperlipidemia, Diabetes, Atrial Fibrillation, Heart Failure, BPH and Chronic Bronchitis, Degenerative joint disease, Insomnia  Moved to [place], retired 3 years ago. [place]. Taking care of significant other. Talking on the phone, taking walks.   Office Visits: 11/14/19 Dr Ronnald Ramp OV: routine f/u, d/c'd furosemide and potassium.  09/06/19 Dr Ronnald Ramp OV: pt c/o BRBPR, likely hemorrhoidal, deferred rectal exam, no med changes.  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 S/p Afib ablation, MAZE, maintaining NSR. Stopped amiodarone. Continue Xarelto.  12/28/19 Dr Servando Snare (CT surgery): f/u AVR, ascending aorta replacement, maze procedure, atrial clip - pt recovering well.   11/10/19 (cardiology): s/p AVR and aortoplasty; instructed to hold furosemide and potassium indefinitely due to significant weight loss while on short course of diuretic.  Allergies  Allergen Reactions  . Quinolones Other (See Comments)    Patient was warned about not using Cipro and similar antibiotics. Recent studies have raised concern that fluoroquinolone antibiotics could be associated with an increased risk of aortic aneurysm Fluoroquinolones have non-antimicrobial properties that might jeopardise the integrity of the extracellular matrix of the vascular wall In a  propensity score  matched cohort study in [place], there was a 66% increased rate of aortic aneurysm or dissection associated with oral fluoroquinolone use, compared wit    Medications: Outpatient Encounter Medications as of 01/17/2020  Medication Sig Note  . alfuzosin (UROXATRAL) 10 MG 24 hr tablet TAKE 1 TABLET (10 MG TOTAL) BY MOUTH DAILY WITH BREAKFAST.   Marland Kitchen atorvastatin (LIPITOR) 20 MG tablet TAKE 1 TABLET BY MOUTH EVERYDAY AT BEDTIME (Patient taking differently: Take 20 mg by mouth daily. )   . Empagliflozin-metFORMIN HCl ER (SYNJARDY XR) 25-1000 MG TB24 Take 1 tablet by mouth daily.   . metoprolol tartrate (LOPRESSOR) 25 MG tablet TAKE 0.5 TABLETS (12.5 MG TOTAL) BY MOUTH 2 (TWO) TIMES DAILY.   Marland Kitchen spironolactone (ALDACTONE) 25 MG tablet TAKE 0.5 TABLETS (12.5 MG TOTAL) BY MOUTH AT BEDTIME.   . Tiotropium Bromide Monohydrate (SPIRIVA RESPIMAT) 1.25 MCG/ACT AERS Inhale 2 puffs into the lungs daily.   . triazolam (HALCION) 0.25 MG tablet TAKE 1 TABLET (0.25 MG TOTAL) BY MOUTH AT BEDTIME AS NEEDED FOR SLEEP.   Marland Kitchen XARELTO 20 MG TABS tablet TAKE 1 TABLET (20 MG TOTAL) BY MOUTH DAILY WITH SUPPER. (Patient taking differently: Take 20 mg by mouth daily with supper. ) 10/16/2019: Stopped last Monday April 26th    . hydrocortisone cream 1 % Apply 1 application topically daily as needed (rash). (Patient not taking: Reported on 01/17/2020)   . Menthol-Methyl Salicylate (SALONPAS PAIN RELIEF PATCH EX) Apply 1 patch topically daily as needed (back pain). (Patient not taking: Reported on 01/17/2020)   . [DISCONTINUED] alfuzosin (UROXATRAL) 10 MG 24 hr tablet Take 1 tablet (10 mg total) by mouth daily with breakfast.   . [DISCONTINUED] rivaroxaban (XARELTO) 20 MG TABS tablet Take 1 tablet (20 mg total) by mouth daily  with supper.    No facility-administered encounter medications on file as of 01/17/2020.     Current Diagnosis/Assessment:  SDOH Interventions     Most Recent Value  SDOH Interventions  Financial Strain Interventions  Other (Comment)  [Synjardy PAP, Xarelto Janssen Select]      Goals Addressed            This Visit's Progress   . Pharmacy Care Plan       CARE PLAN ENTRY (see longitudinal plan of care for additional care plan information)  Current Barriers:  . Chronic Disease Management support, education, and care coordination needs related to Hypertension, Hyperlipidemia, Diabetes, Atrial Fibrillation, and Heart Failure   Hypertension / Heart Failure BP Readings from Last 3 Encounters:  01/01/20 116/66  12/28/19 114/76  11/16/19 104/69 .  Pharmacist Clinical Goal(s): o Over the next 180 days, patient will work with PharmD and providers to maintain BP goal <130/80 . Current regimen:  o Metoprolol tartrate 25 mg - 1/2 tablet twice a day o Spironolactone 25 mg - 1/2 tablet at bedtime . Interventions: o Discussed BP goals and benefits of medication for prevention of heart attack / stroke . Patient self care activities - Over the next 180 days, patient will: o Check BP as needed, document, and provide at future appointments o Ensure daily salt intake < 2300 mg/day  Hyperlipidemia Lab Results  Component Value Date/Time   LDLCALC 48 06/01/2018 01:28 PM   [place] 46 03/11/2017 03:41 PM   LDLDIRECT 78.0 05/29/2019 02:30 PM .  Pharmacist Clinical Goal(s): o Over the next 180 days, patient will work with PharmD and providers to maintain LDL goal < 70 . Current regimen:  o Atorvastatin 20 mg daily . Interventions: o Discussed cholesterol  goals and benefits of medication for prevention of heart attack / stroke . Patient self care activities - Over the next 180 days, patient will: o Continue medication as prescribed  Diabetes Lab Results  Component Value Date/Time   HGBA1C 6.3 (H) 10/12/2019 11:45 AM   HGBA1C 6.6 (H) 09/06/2019 11:21 AM .  Pharmacist Clinical Goal(s): o Over the next 180 days, patient will work with PharmD and providers to maintain A1c goal <7% . Current regimen:   o Synjardy XR 25-1000 mg daily . Interventions: o Discussed A1c goal and benefits of medication, diet and exercise for prevention of diabetic complications o Pursue patient assistance for Synjardy via [employer] . Patient self care activities - Over the next 180 days, patient will: o Continue medication as prescribed o Continue diet and exercise routine o Sign patient assistance application at the office  Atrial Fibrillation . Pharmacist Clinical Goal(s) o Over the next 180 days, patient will work with PharmD and providers to optimize therapy . Current regimen:  o Metoprolol tartrate 25 mg - 1/2 tablet BID o Xarelto 20 mg daily . Interventions: o Discussed high cost Xarelto and options to save money . Patient self care activities - Over the next 180 days, patient will: o Call Engineer, maintenance ([phone]) to get Xarelto for $85 per month during the donut hole  Medication management . Pharmacist Clinical Goal(s): o Over the next 180 days, patient will work with PharmD and providers to maintain optimal medication adherence . Current [facility]: [facility] . Interventions o Comprehensive medication review performed. o Continue current medication management strategy . Patient self care activities - Over the next 180 days, patient will: o Focus on medication adherence by pill box o Take medications as prescribed o  Report any questions or concerns to PharmD and/or provider(s)  Initial goal documentation       Heart Failure / Hypertension   HF Type: Diastolic  Last ejection fraction: 55-60% (12/01/2019) NYHA Class: II (slight limitation of activity)  BP goal is:  <130/80 BP Readings from Last 3 Encounters:  01/01/20 116/66  12/28/19 114/76  11/16/19 104/69   Kidney Function Lab Results  Component Value Date/Time   CREATININE 1.53 (H) 11/10/2019 03:41 PM   CREATININE 1.03 10/23/2019 03:09 AM   GFR 69.38 09/06/2019 11:21 AM   GFRNONAA 45 (L) 11/10/2019 03:41 PM   GFRAA  52 (L) 11/10/2019 03:41 PM   K 4.6 11/10/2019 03:41 PM   K 3.7 10/23/2019 03:09 AM   Patient has failed these meds in past: furosemide Patient is currently controlled on the following medications:  Marland Kitchen Metoprolol tartrate 25 mg - 1/2 tablet BID . Spironolactone 25 mg - 1/2 tablet HS  We discussed diet and exercise extensively; BP goals; benefits of medications   Plan  Continue current medications and control with diet and exercise   AFIB   Patient is currently rate controlled. Office heart rates are  Pulse Readings from Last 3 Encounters:  01/01/20 68  12/28/19 73  11/16/19 75   CHA2DS2-VASc Score = 5  The patient's score is based upon: CHF History: 1 HTN History: 1 Age : 1 Diabetes History: 1 Stroke History: 0 Vascular Disease History: 1 Gender: 0  Patient has failed these meds in past: amiodarone Patient is currently controlled on the following medications:  Marland Kitchen Metoprolol tartrate 25 mg - 1/2 tablet BID . Xarelto 20 mg daily  We discussed:  diet and exercise extensively; Xarelto is >$350 for 90 day supply, patient can get get it for $85/month through [employer]. Pt may be stopping Xarelto over the next year given successful ablation and maintenance of NSR.  Plan  Continue current medications and control with diet and exercise  Hyperlipidemia   LDL goal < 70  Lipid Panel     Component Value Date/Time   CHOL 148 05/29/2019 1430   CHOL 105 03/11/2017 1541   TRIG 255.0 (H) 05/29/2019 1430   HDL 34.00 (L) 05/29/2019 1430   HDL 36 (L) 03/11/2017 1541   LDLCALC 48 06/01/2018 1328   LDLCALC 46 03/11/2017 1541   LDLDIRECT 78.0 05/29/2019 1430    Hepatic Function Latest Ref Rng & Units 10/12/2019 06/01/2018 03/11/2017  Total Protein 6.5 - 8.1 g/dL 7.0 7.1 6.5  Albumin 3.5 - 5.0 g/dL 4.3 4.6 4.5  AST 15 - 41 U/L _0 ALT 0 - 44 U/L _1 Alk Phosphatase 38 - 126 U/L 88 91 98  Total Bilirubin 0.3 - 1.2 mg/dL 0.9 0.9 0.6  Bilirubin, Direct 0.00 - 0.40  mg/dL - - 0.18     The 10-year ASCVD risk score Mikey Bussing DC Jr., et al., 2013) is: 36.2%   Values used to calculate the score:     Age: 49 years     Sex: Male     Is Non-Hispanic African American: No     Diabetic: Yes     Tobacco smoker: No     Systolic Blood Pressure: 867 mmHg     Is BP treated: Yes     HDL Cholesterol: 34 mg/dL     Total Cholesterol: 148 mg/dL   Patient has failed these meds in past: n/a Patient is currently controlled on the following medications:  . Atorvastatin 20  mg daily  We discussed:  diet and exercise extensively; cholesterol goals; benefits of statin for ASCVD risk reduction  Plan  Continue current medications and control with diet and exercise  Diabetes   A1c goal <7%  Recent Relevant Labs: Lab Results  Component Value Date/Time   HGBA1C 6.3 (H) 10/12/2019 11:45 AM   HGBA1C 6.6 (H) 09/06/2019 11:21 AM   GFR 69.38 09/06/2019 11:21 AM   GFR 66.51 05/29/2019 02:30 PM   MICROALBUR 0.9 05/29/2019 02:30 PM   MICROALBUR 4.6 (H) 06/01/2018 01:35 PM    Last diabetic Eye exam:  Lab Results  Component Value Date/Time   HMDIABEYEEXA No Retinopathy 03/23/2019 12:00 AM    Last diabetic Foot exam: No results found for: HMDIABFOOTEX   Checking BG: Never   Patient has failed these meds in past: n/a Patient is currently controlled on the following medications: . Synjardy XR (empagliflozin-metformin) 25-1000 mg daily  We discussed: diet and exercise extensively; walks 2-1/2 miles every day, tries to eat healthy. Pt reports Synjardy is $400 per 90 days. He also reports his income is less than the [employer] limit so he should qualify.  Plan  Continue current medications and control with diet and exercise  Pursue Synjardy PAP via [employer]  Chronic Bronchitis   Last spirometry score:  10/13/2019: FEV1 81% predicted; FEV1/FVC 0.76  Gold Grade: Gold 1 (FEV1>80%) Current COPD Classification:  A (low sx, <2 exacerbations/yr)  Lab Results  Component  Value Date/Time   EOSPCT 5.1 (H) 09/06/2019 11:21 AM   EOSABS 0.4 09/06/2019 11:21 AM   EOSABS 0.4 01/25/2019 01:58 PM   Patient has failed these meds in past: n/a Patient is currently controlled on the following medications:  Spiriva Respimat - not taking for several months  Using maintenance inhaler regularly? No Frequency of rescue inhaler use:  never  We discussed:  Pt reports SOB has vastly improved since Afib ablation; he denies SOB, wheezing and believes he does not need an inhaler right now.  Plan  Continue to monitor  BPH   PSA  Date Value Ref Range Status  05/29/2019 4.75 (H) 0.10 - 4.00 ng/mL Final    Comment:    Test performed using Access Hybritech PSA Assay, a parmagnetic partical, chemiluminecent immunoassay.  11/01/2017 0.94  Final  10/22/2015 2.28  Final    Patient has failed these meds in past: finasteride Patient is currently uncontrolled on the following medications:   Alfuzosin 10 mg daily AM  We discussed:  Pt reports his urinary symptoms have not improved since taking alfuzosin; he reports they did improve with finasteride but he could not tolerate the side effects. Pt sees a urologist every 6 months, encouraged him to discuss at his next visit  Plan  Continue current medications  Follow up with urologist as scheduled  Insomnia   Patient has failed these meds in past: n/a Patient is currently controlled on the following medications:  . Triazolam 0.25 mg HS prn  We discussed: Pt reports he sleeps fine with the med, when he doesn't take it he has a restless night sleep. He takes it 4-5 nights per week.  Plan  Continue current medications   Medication Management   Pt uses [facility], [place] for all medications Uses pill box? Yes Pt endorses 100% compliance  We discussed: [facility] because it is not covered by insurance and is cheaper there; he would like other meds to be filled via mail order if  possible.  Plan  Continue current medication management strategy    Follow up: 6 month phone visit  Charlene Brooke, PharmD, [facility] Clinical Pharmacist [facility] [phone]

## 2020-01-17 ENCOUNTER — Other Ambulatory Visit: Payer: Self-pay

## 2020-01-17 ENCOUNTER — Ambulatory Visit: Payer: Medicare HMO | Admitting: Pharmacist

## 2020-01-17 DIAGNOSIS — I4819 Other persistent atrial fibrillation: Secondary | ICD-10-CM

## 2020-01-17 DIAGNOSIS — E785 Hyperlipidemia, unspecified: Secondary | ICD-10-CM

## 2020-01-17 DIAGNOSIS — I10 Essential (primary) hypertension: Secondary | ICD-10-CM

## 2020-01-17 DIAGNOSIS — I5032 Chronic diastolic (congestive) heart failure: Secondary | ICD-10-CM

## 2020-01-17 DIAGNOSIS — E118 Type 2 diabetes mellitus with unspecified complications: Secondary | ICD-10-CM

## 2020-01-17 NOTE — Patient Instructions (Addendum)
Visit Information  Phone number for Pharmacist: [phone]  Thank you for meeting with me to discuss your medications! I look forward to working with you to achieve your health care goals. Below is a summary of what we talked about during the visit:  Goals Addressed            This Visit's Progress   . Pharmacy Care Plan       CARE PLAN ENTRY (see longitudinal plan of care for additional care plan information)  Current Barriers:  . Chronic Disease Management support, education, and care coordination needs related to Hypertension, Hyperlipidemia, Diabetes, Atrial Fibrillation, and Heart Failure   Hypertension / Heart Failure BP Readings from Last 3 Encounters:  01/01/20 116/66  12/28/19 114/76  11/16/19 104/69 .  Pharmacist Clinical Goal(s): o Over the next 180 days, patient will work with PharmD and providers to maintain BP goal <130/80 . Current regimen:  o Metoprolol tartrate 25 mg - 1/2 tablet twice a day o Spironolactone 25 mg - 1/2 tablet at bedtime . Interventions: o Discussed BP goals and benefits of medication for prevention of heart attack / stroke . Patient self care activities - Over the next 180 days, patient will: o Check BP as needed, document, and provide at future appointments o Ensure daily salt intake < 2300 mg/day  Hyperlipidemia Lab Results  Component Value Date/Time   LDLCALC 48 06/01/2018 01:28 PM   [place] 46 03/11/2017 03:41 PM   LDLDIRECT 78.0 05/29/2019 02:30 PM .  Pharmacist Clinical Goal(s): o Over the next 180 days, patient will work with PharmD and providers to maintain LDL goal < 70 . Current regimen:  o Atorvastatin 20 mg daily . Interventions: o Discussed cholesterol  goals and benefits of medication for prevention of heart attack / stroke . Patient self care activities - Over the next 180 days, patient will: o Continue medication as prescribed  Diabetes Lab Results  Component Value Date/Time   HGBA1C 6.3 (H) 10/12/2019 11:45 AM    HGBA1C 6.6 (H) 09/06/2019 11:21 AM .  Pharmacist Clinical Goal(s): o Over the next 180 days, patient will work with PharmD and providers to maintain A1c goal <7% . Current regimen:  o Synjardy XR 25-1000 mg daily . Interventions: o Discussed A1c goal and benefits of medication, diet and exercise for prevention of diabetic complications o Pursue patient assistance for Synjardy via [employer] . Patient self care activities - Over the next 180 days, patient will: o Continue medication as prescribed o Continue diet and exercise routine o Sign patient assistance application at the office  Atrial Fibrillation . Pharmacist Clinical Goal(s) o Over the next 180 days, patient will work with PharmD and providers to optimize therapy . Current regimen:  o Metoprolol tartrate 25 mg - 1/2 tablet BID o Xarelto 20 mg daily . Interventions: o Discussed high cost Xarelto and options to save money . Patient self care activities - Over the next 180 days, patient will: o Call Engineer, maintenance ([phone]) to get Xarelto for $85 per month during the donut hole  Medication management . Pharmacist Clinical Goal(s): o Over the next 180 days, patient will work with PharmD and providers to maintain optimal medication adherence . Current pharmacy: [facility] . Interventions o Comprehensive medication review performed. o Continue current medication management strategy . Patient self care activities - Over the next 180 days, patient will: o Focus on medication adherence by pill box o Take medications as prescribed o Report any questions or concerns to  PharmD and/or provider(s)  Initial goal documentation       Clinton Black was given information about Chronic Care Management services today including:  1. CCM service includes personalized support from designated clinical staff supervised by his physician, including individualized plan of care and coordination with other care providers 2. 24/7 contact phone  numbers for assistance for urgent and routine care needs. 3. Standard insurance, coinsurance, copays and deductibles apply for chronic care management only during months in which we provide at least 20 minutes of these services. Most insurances cover these services at 100%, however patients may be responsible for any copay, coinsurance and/or deductible if applicable. This service may help you avoid the need for more expensive face-to-face services. 4. Only one practitioner may furnish and bill the service in a calendar month. 5. The patient may stop CCM services at any time (effective at the end of the month) by phone call to the office staff.  Patient agreed to services and verbal consent obtained.   The patient verbalized understanding of instructions provided today and agreed to receive a mailed copy of patient instruction and/or educational materials. Telephone follow up appointment with pharmacy team member scheduled for: 6 months  Charlene Brooke, PharmD Clinical Pharmacist [facility] [phone]  Heart-Healthy Eating Plan Many factors influence your heart (coronary) health, including eating and exercise habits. Coronary risk increases with abnormal blood fat (lipid) levels. Heart-healthy meal planning includes limiting unhealthy fats, increasing healthy fats, and making other diet and lifestyle changes. What are tips for following this plan? Cooking Cook foods using methods other than frying. Baking, boiling, grilling, and broiling are all good options. Other ways to reduce fat include:  Removing the skin from poultry.  Removing all visible fats from meats.  Steaming vegetables in water or broth. Meal planning   At meals, imagine dividing your plate into fourths: ? Fill one-half of your plate with vegetables and green salads. ? Fill one-fourth of your plate with whole grains. ? Fill one-fourth of your plate with lean protein foods.  Eat 4-5  servings of vegetables per day. One serving equals 1 cup raw or cooked vegetable, or 2 cups raw leafy greens.  Eat 4-5 servings of fruit per day. One serving equals 1 medium whole fruit,  cup dried fruit,  cup fresh, frozen, or canned fruit, or  cup 100% fruit juice.  Eat more foods that contain soluble fiber. Examples include apples, broccoli, carrots, beans, peas, and barley. Aim to get 25-30 g of fiber per day.  Increase your consumption of legumes, nuts, and seeds to 4-5 servings per week. One serving of dried beans or legumes equals  cup cooked, 1 serving of nuts is  cup, and 1 serving of seeds equals 1 tablespoon. Fats  Choose healthy fats more often. Choose monounsaturated and polyunsaturated fats, such as olive and canola oils, flaxseeds, walnuts, almonds, and seeds.  Eat more omega-3 fats. Choose salmon, mackerel, sardines, tuna, flaxseed oil, and ground flaxseeds. Aim to eat fish at least 2 times each week.  Check food labels carefully to identify foods with trans fats or high amounts of saturated fat.  Limit saturated fats. These are found in animal products, such as meats, butter, and cream. Plant sources of saturated fats include palm oil, palm kernel oil, and coconut oil.  Avoid foods with partially hydrogenated oils in them. These contain trans fats. Examples are stick margarine, some tub margarines, cookies, crackers, and other baked goods.  Avoid fried foods.  General information  Eat more home-cooked food and less restaurant, buffet, and fast food.  Limit or avoid alcohol.  Limit foods that are high in starch and sugar.  Lose weight if you are overweight. Losing just 5-10% of your body weight can help your overall health and prevent diseases such as diabetes and heart disease.  Monitor your salt (sodium) intake, especially if you have high blood pressure. Talk with your health care provider about your sodium intake.  Try to incorporate more vegetarian meals  weekly. What foods can I eat? Fruits All fresh, canned (in natural juice), or frozen fruits. Vegetables Fresh or frozen vegetables (raw, steamed, roasted, or grilled). Green salads. Grains Most grains. Choose whole wheat and whole grains most of the time. Rice and pasta, including brown rice and pastas made with whole wheat. Meats and other proteins Lean, well-trimmed beef, veal, pork, and lamb. Chicken and [place] without skin. All fish and shellfish. Wild duck, rabbit, pheasant, and venison. Egg whites or low-cholesterol egg substitutes. Dried beans, peas, lentils, and tofu. Seeds and most nuts. Dairy Low-fat or nonfat cheeses, including ricotta and mozzarella. Skim or 1% milk (liquid, powdered, or evaporated). Buttermilk made with low-fat milk. Nonfat or low-fat yogurt. Fats and oils Non-hydrogenated (trans-free) margarines. Vegetable oils, including soybean, sesame, sunflower, olive, peanut, safflower, corn, canola, and cottonseed. Salad dressings or mayonnaise made with a vegetable oil. Beverages Water (mineral or sparkling). Coffee and tea. Diet carbonated beverages. Sweets and desserts Sherbet, gelatin, and fruit ice. Small amounts of dark chocolate. Limit all sweets and desserts. Seasonings and condiments All seasonings and condiments. The items listed above may not be a complete list of foods and beverages you can eat. Contact a dietitian for more options. What foods are not recommended? Fruits Canned fruit in heavy syrup. Fruit in cream or butter sauce. Fried fruit. Limit coconut. Vegetables Vegetables cooked in cheese, cream, or butter sauce. Fried vegetables. Grains Breads made with saturated or trans fats, oils, or whole milk. Croissants. Sweet rolls. Donuts. High-fat crackers, such as cheese crackers. Meats and other proteins Fatty meats, such as hot dogs, ribs, sausage, bacon, rib-eye roast or steak. High-fat deli meats, such as salami and bologna. Caviar. Domestic duck  and goose. Organ meats, such as liver. Dairy Cream, sour cream, cream cheese, and creamed cottage cheese. Whole milk cheeses. Whole or 2% milk (liquid, evaporated, or condensed). Whole buttermilk. Cream sauce or high-fat cheese sauce. Whole-milk yogurt. Fats and oils Meat fat, or shortening. Cocoa butter, hydrogenated oils, palm oil, coconut oil, palm kernel oil. Solid fats and shortenings, including bacon fat, salt pork, lard, and butter. Nondairy cream substitutes. Salad dressings with cheese or sour cream. Beverages Regular sodas and any drinks with added sugar. Sweets and desserts Frosting. Pudding. Cookies. Cakes. Pies. Milk chocolate or white chocolate. Buttered syrups. Full-fat ice cream or ice cream drinks. The items listed above may not be a complete list of foods and beverages to avoid. Contact a dietitian for more information. Summary  Heart-healthy meal planning includes limiting unhealthy fats, increasing healthy fats, and making other diet and lifestyle changes.  Lose weight if you are overweight. Losing just 5-10% of your body weight can help your overall health and prevent diseases such as diabetes and heart disease.  Focus on eating a balance of foods, including fruits and vegetables, low-fat or nonfat dairy, lean protein, nuts and legumes, whole grains, and heart-healthy oils and fats. This information is not intended to replace advice given to you by your health care  provider. Make sure you discuss any questions you have with your health care provider. Document Revised: 07/09/2017 Document Reviewed: 07/09/2017 Elsevier Patient Education  2020 [employer].

## 2020-01-18 ENCOUNTER — Telehealth: Payer: Self-pay

## 2020-01-18 DIAGNOSIS — E785 Hyperlipidemia, unspecified: Secondary | ICD-10-CM

## 2020-01-18 DIAGNOSIS — E118 Type 2 diabetes mellitus with unspecified complications: Secondary | ICD-10-CM

## 2020-01-18 DIAGNOSIS — N400 Enlarged prostate without lower urinary tract symptoms: Secondary | ICD-10-CM

## 2020-01-18 MED ORDER — ALFUZOSIN HCL ER 10 MG PO TB24: 10.0000 mg | ORAL_TABLET | Freq: Every day | ORAL | 1 refills | Status: DC

## 2020-01-18 MED ORDER — ATORVASTATIN CALCIUM 20 MG PO TABS: 20.0000 mg | ORAL_TABLET | Freq: Every day | ORAL | 1 refills | Status: DC

## 2020-01-18 NOTE — Addendum Note (Signed)
Addended by: Karle Barr on: 01/18/2020 04:26 PM   Modules accepted: Orders

## 2020-01-18 NOTE — Telephone Encounter (Signed)
Pt is [facility].   Erx sent per request.

## 2020-01-25 ENCOUNTER — Encounter: Payer: Medicare HMO | Admitting: Cardiothoracic Surgery

## 2020-01-29 ENCOUNTER — Other Ambulatory Visit: Payer: Self-pay | Admitting: Internal Medicine

## 2020-01-29 DIAGNOSIS — G4700 Insomnia, unspecified: Secondary | ICD-10-CM

## 2020-02-08 ENCOUNTER — Other Ambulatory Visit: Payer: Self-pay | Admitting: Internal Medicine

## 2020-02-08 DIAGNOSIS — E118 Type 2 diabetes mellitus with unspecified complications: Secondary | ICD-10-CM

## 2020-02-08 DIAGNOSIS — E785 Hyperlipidemia, unspecified: Secondary | ICD-10-CM

## 2020-02-19 ENCOUNTER — Other Ambulatory Visit: Payer: Self-pay | Admitting: Internal Medicine

## 2020-02-19 DIAGNOSIS — I4891 Unspecified atrial fibrillation: Secondary | ICD-10-CM

## 2020-03-07 ENCOUNTER — Telehealth: Payer: Self-pay | Admitting: Internal Medicine

## 2020-03-07 NOTE — Telephone Encounter (Signed)
I do not see any reference to injectable furosemide in the chart.  This request should go to person who prescribed it, unless I missed something. Thanks

## 2020-03-07 NOTE — Telephone Encounter (Signed)
[facility] with [facility] called and was requesting a med refill forfurosemide (LASIX) injection 40 mg.    [phone] [phone]

## 2020-03-07 NOTE — Telephone Encounter (Signed)
Called pt, LVM.   

## 2020-03-11 ENCOUNTER — Other Ambulatory Visit: Payer: Self-pay | Admitting: Internal Medicine

## 2020-03-11 DIAGNOSIS — E118 Type 2 diabetes mellitus with unspecified complications: Secondary | ICD-10-CM

## 2020-03-11 DIAGNOSIS — I4819 Other persistent atrial fibrillation: Secondary | ICD-10-CM

## 2020-03-11 DIAGNOSIS — E785 Hyperlipidemia, unspecified: Secondary | ICD-10-CM

## 2020-03-11 DIAGNOSIS — I5032 Chronic diastolic (congestive) heart failure: Secondary | ICD-10-CM

## 2020-03-11 DIAGNOSIS — I10 Essential (primary) hypertension: Secondary | ICD-10-CM

## 2020-03-11 MED ORDER — ATORVASTATIN CALCIUM 20 MG PO TABS: ORAL_TABLET | ORAL | 1 refills | Status: DC

## 2020-03-11 MED ORDER — METOPROLOL TARTRATE 25 MG PO TABS: 12.5000 mg | ORAL_TABLET | Freq: Two times a day (BID) | ORAL | 1 refills | Status: DC

## 2020-03-11 MED ORDER — SPIRONOLACTONE 25 MG PO TABS: 12.5000 mg | ORAL_TABLET | Freq: Every day | ORAL | 1 refills | Status: DC

## 2020-03-13 DIAGNOSIS — R0602 Shortness of breath: Secondary | ICD-10-CM | POA: Insufficient documentation

## 2020-03-13 DIAGNOSIS — I110 Hypertensive heart disease with heart failure: Secondary | ICD-10-CM | POA: Insufficient documentation

## 2020-03-13 DIAGNOSIS — R002 Palpitations: Secondary | ICD-10-CM | POA: Insufficient documentation

## 2020-03-13 DIAGNOSIS — E1169 Type 2 diabetes mellitus with other specified complication: Secondary | ICD-10-CM | POA: Diagnosis not present

## 2020-03-13 DIAGNOSIS — R42 Dizziness and giddiness: Secondary | ICD-10-CM | POA: Diagnosis not present

## 2020-03-13 DIAGNOSIS — Z79899 Other long term (current) drug therapy: Secondary | ICD-10-CM | POA: Diagnosis not present

## 2020-03-13 DIAGNOSIS — Z7901 Long term (current) use of anticoagulants: Secondary | ICD-10-CM | POA: Diagnosis not present

## 2020-03-13 DIAGNOSIS — I4891 Unspecified atrial fibrillation: Secondary | ICD-10-CM | POA: Diagnosis not present

## 2020-03-13 DIAGNOSIS — Z7984 Long term (current) use of oral hypoglycemic drugs: Secondary | ICD-10-CM | POA: Insufficient documentation

## 2020-03-13 DIAGNOSIS — R231 Pallor: Secondary | ICD-10-CM | POA: Diagnosis not present

## 2020-03-13 DIAGNOSIS — Z87891 Personal history of nicotine dependence: Secondary | ICD-10-CM | POA: Insufficient documentation

## 2020-03-13 DIAGNOSIS — R0902 Hypoxemia: Secondary | ICD-10-CM | POA: Diagnosis not present

## 2020-03-13 DIAGNOSIS — I5032 Chronic diastolic (congestive) heart failure: Secondary | ICD-10-CM | POA: Insufficient documentation

## 2020-03-13 DIAGNOSIS — I10 Essential (primary) hypertension: Secondary | ICD-10-CM | POA: Diagnosis not present

## 2020-03-13 NOTE — ED Triage Notes (Signed)
Pt presents to ED from home BIB [facility]. Pt c/o dizziness. Pt reports that home HR was 180. Ems found pt in NSR.   Ems given 1L IVF 136/77 HR - 75

## 2020-03-14 ENCOUNTER — Other Ambulatory Visit: Payer: Self-pay

## 2020-03-14 ENCOUNTER — Telehealth: Payer: Self-pay | Admitting: Cardiology

## 2020-03-14 ENCOUNTER — Encounter (HOSPITAL_COMMUNITY): Payer: Self-pay | Admitting: Emergency Medicine

## 2020-03-14 ENCOUNTER — Emergency Department (HOSPITAL_COMMUNITY)
Admission: EM | Admit: 2020-03-14 | Discharge: 2020-03-14 | Disposition: A | Discharge status: Home / Self Care | Payer: Medicare HMO | Attending: Emergency Medicine | Admitting: Emergency Medicine

## 2020-03-14 DIAGNOSIS — R002 Palpitations: Secondary | ICD-10-CM

## 2020-03-14 LAB — CBC
HCT: 44.6 % (ref 39.0–52.0)
Hemoglobin: 14 g/dL (ref 13.0–17.0)
MCH: 27.6 pg (ref 26.0–34.0)
MCHC: 31.4 g/dL (ref 30.0–36.0)
MCV: 87.8 fL (ref 80.0–100.0)
Platelets: 199 10*3/uL (ref 150–400)
RBC: 5.08 MIL/uL (ref 4.22–5.81)
RDW: 14.4 % (ref 11.5–15.5)
WBC: 9.2 10*3/uL (ref 4.0–10.5)
nRBC: 0 % (ref 0.0–0.2)

## 2020-03-14 LAB — BASIC METABOLIC PANEL
Anion gap: 11 (ref 5–15)
BUN: 18 mg/dL (ref 8–23)
CO2: 20 mmol/L — ABNORMAL LOW (ref 22–32)
Calcium: 9.5 mg/dL (ref 8.9–10.3)
Chloride: 110 mmol/L (ref 98–111)
Creatinine, Ser: 1.2 mg/dL (ref 0.61–1.24)
GFR calc Af Amer: >60 mL/min (ref >60)
GFR calc non Af Amer: >60 mL/min (ref >60)
Glucose, Bld: 117 mg/dL — ABNORMAL HIGH (ref 70–99)
Potassium: 4.3 mmol/L (ref 3.5–5.1)
Sodium: 141 mmol/L (ref 135–145)

## 2020-03-14 NOTE — Telephone Encounter (Signed)
Patient was in ER last night and was told to schedule appt with Dr. [name] or APP asap. Scheduled appt with Clinton Black on Friday 03/15/20 at 2:15 pm

## 2020-03-14 NOTE — ED Provider Notes (Signed)
[facility] EMERGENCY DEPARTMENT Provider Note   CSN: [id number] Arrival date & time: 03/13/20  2340     History Chief Complaint  Patient presents with  . Dizziness    Clinton Black is a 74 y.o. male.  Patient is a 74 year old male who presents with dizziness and rapid heart rate.  He has a history of diabetes, hypertension, hyperlipidemia and atrial fibrillation on Xarelto.  He recently had a thoracic aneurysm repair with a graft which included also a Maze procedure and Atriclip placement.  He said he had been doing well.  He was hoping some carpet layer is cut some carpet in his house yesterday and had onset of lightheadedness with diaphoresis and some mild shortness of breath.  He felt like his heart was racing and he checked his heart rate and it was 180.  He laid down and rested and it went away on its own.  EMS noted him to be in a normal sinus rhythm.  He was given 1 L IV fluids by EMS.  He is currently asymptomatic.  He does not report any associated chest pain.  No recent illnesses.  No leg swelling.  He does note that he ran out of his metoprolol over the weekend and the last 2 days had doubled up on it.        Past Medical History:  Diagnosis Date  . Arthritis   . Atrial fibrillation ([place])   . BPH (benign prostatic hyperplasia)   . Diabetes mellitus type 2 in obese (HCC)    diet controlled  . HTN (hypertension)   . Hyperlipidemia, mixed   . Insomnia   . Obesity   . Thoracic aortic aneurysm, without rupture (HCC)    4.7 cm per chest ct with contrast 11-04-17    Patient Active Problem List   Diagnosis Date Noted  . 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 BRBPR (bright red blood per rectum) 09/06/2019  . Mucopurulent chronic bronchitis ([place]) 05/30/2019  . Benign prostatic hyperplasia without lower urinary tract symptoms 05/29/2019  . BPH with elevated PSA 05/29/2019  . Thoracic aortic aneurysm ([place]) 01/30/2019  . TSH elevation 10/22/2018   . Persistent atrial fibrillation ([place]) 06/01/2018  . 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 Chronic diastolic CHF (congestive heart failure), NYHA class 2 ([place]) 07/06/2017  . Mild tetrahydrocannabinol (THC) abuse 12/30/2016  . Essential hypertension 12/24/2016  . Hyperlipidemia LDL goal <130 05/11/2016  . Type 2 diabetes mellitus with complication, without long-term current use of insulin ([place]) 05/11/2016  . Routine general medical examination at a health care facility 10/06/2013  . COLONIC POLYPS 05/28/2009  . Obesity, morbid ([place]) 05/28/2009  . 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 DEGENERATIVE JOINT DISEASE 05/28/2009  . Insomnia w/ sleep apnea 05/28/2009    Past Surgical History:  Procedure Laterality Date  . AORTIC VALVE REPLACEMENT N/A 10/16/2019   Procedure: AORTIC VALVE REPLACEMENT (AVR) using Edwards PERIMOUNT Magna Ease 25MM Bioprosthesis Aortic Valve.;  Surgeon: [name], MD;  Location: [facility];  Service: Open Heart Surgery;  Laterality: N/A;  Right subclavian artery cannulation.  . AORTIC VALVE REPLACEMENT  10/16/2019   AORTIC VALVE REPLACEMENT (AVR)   . ATRIAL FIBRILLATION ABLATION N/A 04/07/2019   Procedure: ATRIAL FIBRILLATION ABLATION;  Surgeon: [name], MD;  Location: [facility];  Service: Cardiovascular;  Laterality: N/A;  . CARDIOVERSION N/A 07/12/2018   Procedure: CARDIOVERSION;  Surgeon: Thayer Headings, MD;  Location: [facility];  Service: Cardiovascular;  Laterality: N/A;  . CARDIOVERSION N/A 08/22/2018   Procedure: CARDIOVERSION;  Surgeon: Skeet Latch, MD;  Location: [facility] ENDOSCOPY;  Service: Cardiovascular;  Laterality: N/A;  . CARDIOVERSION N/A 06/28/2019   Procedure: CARDIOVERSION;  Surgeon: Thayer Headings, MD;  Location: [facility];  Service: Cardiovascular;  Laterality: N/A;  . CLIPPING OF ATRIAL APPENDAGE N/A 10/16/2019   Procedure: Clipping Of Atrial Appendage using AtriCure AtriClip Exclusion  VLAA 81mm.;  Surgeon: [name], MD;  Location: [facility];  Service: Open Heart Surgery;  Laterality: N/A;  . COLONOSCOPY  2016  . CYST EXCISION     polynomial  . CYSTOSCOPY WITH INSERTION OF UROLIFT N/A 04/18/2018   Procedure: CYSTOSCOPY WITH INSERTION OF UROLIFT;  Surgeon: Cleon Gustin, MD;  Location: [facility];  Service: Urology;  Laterality: N/A;  . KNEE SURGERY Right 2000   arthroscopy  . MAZE N/A 10/16/2019   Procedure: MAZE with bilateral pulmonary vein isolation.;  Surgeon: [name], MD;  Location: [facility];  Service: Open Heart Surgery;  Laterality: N/A;  Maze procedure using Atricure OLL2 Isolator clamp.  . REPLACEMENT ASCENDING AORTA N/A 10/16/2019   Procedure: SUPRA CORONARY REPLACEMENT OF ASCENDING AORTA using Maquet Hemashiel Platinum 34 MM Vascular Graft.;  Surgeon: [name], MD;  Location: [facility];  Service: Open Heart Surgery;  Laterality: N/A;  Right subclavian artery cannulation.  Marland Kitchen RIGHT/LEFT HEART CATH AND CORONARY ANGIOGRAPHY N/A 01/30/2019   Procedure: RIGHT/LEFT HEART CATH AND CORONARY ANGIOGRAPHY;  Surgeon: [name], [name], MD;  Location: [facility];  Service: Cardiovascular;  Laterality: N/A;  . TEE WITHOUT CARDIOVERSION N/A 10/16/2019   Procedure: TRANSESOPHAGEAL ECHOCARDIOGRAM (TEE);  Surgeon: [name], MD;  Location: [facility];  Service: Open Heart Surgery;  Laterality: N/A;  . TONSILLECTOMY    . widson teeth extraction         Family History  Problem Relation Age of Onset  . Aneurysm Father   . Heart disease Father   . Varicose Veins Mother   . Arthritis Mother   . Macular degeneration Mother   . Cancer Neg Hx   . Colon cancer Neg Hx   . Esophageal cancer Neg Hx   . Rectal cancer Neg Hx   . Stomach cancer Neg Hx     Social History   Tobacco Use  . Smoking status: Former Smoker    Packs/day: 1.50    Years: 5.00    Pack years: 7.50    Types: Cigarettes    Quit date: 06/15/1994    Years since  quitting: 25.7  . Smokeless tobacco: Never Used  Vaping Use  . Vaping Use: Never used  Substance Use Topics  . Alcohol use: Not Currently    Alcohol/week: 14.0 standard drinks    Types: 14 Shots of liquor per week    Comment: 1 drink of liquor per day  . Drug use: Yes    Types: Marijuana    Comment: occasionally marijuana    Home Medications Prior to Admission medications   Medication Sig Start Date End Date Taking? Authorizing Provider  alfuzosin (UROXATRAL) 10 MG 24 hr tablet Take 1 tablet (10 mg total) by mouth daily with breakfast. 01/18/20   [name], MD  atorvastatin (LIPITOR) 20 MG tablet TAKE 1 TABLET BY MOUTH EVERYDAY AT BEDTIME 03/11/20   [name], MD  Empagliflozin-metFORMIN HCl ER (SYNJARDY XR) 25-1000 MG TB24 Take 1 tablet by mouth daily. 09/06/19   Scarlette Calico  L, MD  hydrocortisone cream 1 % Apply 1 application topically daily as needed (rash). Patient not taking: Reported on 01/17/2020    [provider]  Menthol-Methyl Salicylate (SALONPAS PAIN RELIEF PATCH EX) Apply 1 patch topically daily as needed (back pain). Patient not taking: Reported on 01/17/2020    [provider]  metoprolol tartrate (LOPRESSOR) 25 MG tablet Take 0.5 tablets (12.5 mg total) by mouth 2 (two) times daily. 03/11/20   [name], MD  spironolactone (ALDACTONE) 25 MG tablet Take 0.5 tablets (12.5 mg total) by mouth at bedtime. 03/11/20   [name], MD  Tiotropium Bromide Monohydrate (SPIRIVA RESPIMAT) 1.25 MCG/ACT AERS Inhale 2 puffs into the lungs daily. 06/29/19   [name], MD  triazolam (HALCION) 0.25 MG tablet TAKE 1 TABLET (0.25 MG TOTAL) BY MOUTH AT BEDTIME AS NEEDED FOR SLEEP. 01/29/20   [name], MD  XARELTO 20 MG TABS tablet TAKE 1 TABLET EVERY DAY WITH SUPPER 02/19/20   [name], MD  alfuzosin (UROXATRAL) 10 MG 24 hr tablet Take 1 tablet (10 mg total) by mouth daily with breakfast. 05/29/19   [name], MD  atorvastatin (LIPITOR)  20 MG tablet Take 1 tablet (20 mg total) by mouth daily. 01/18/20   [name], MD  rivaroxaban (XARELTO) 20 MG TABS tablet Take 1 tablet (20 mg total) by mouth daily with supper. 01/31/19   [name], [name], MD  XARELTO 20 MG TABS tablet TAKE 1 TABLET (20 MG TOTAL) BY MOUTH DAILY WITH SUPPER. Patient taking differently: Take 20 mg by mouth daily with supper.  09/08/19   [name], MD    Allergies    Quinolones  Review of Systems   Review of Systems  Constitutional: Positive for diaphoresis. Negative for chills, fatigue and fever.  HENT: Negative for congestion, rhinorrhea and sneezing.   Eyes: Negative.   Respiratory: Positive for shortness of breath. Negative for cough and chest tightness.   Cardiovascular: Positive for palpitations. Negative for chest pain and leg swelling.  Gastrointestinal: Negative for abdominal pain, blood in stool, diarrhea, nausea and vomiting.  Genitourinary: Negative for difficulty urinating, flank pain, frequency and hematuria.  Musculoskeletal: Negative for arthralgias and back pain.  Skin: Negative for rash.  Neurological: Negative for dizziness, speech difficulty, weakness, numbness and headaches.    Physical Exam Updated Vital Signs BP (!) 139/91 (BP Location: Right Arm)   Pulse 71   Temp 98.1 F (36.7 C) (Oral)   Resp 18   SpO2 96%   Physical Exam Constitutional:      Appearance: He is well-developed.  HENT:     Head: Normocephalic and atraumatic.  Eyes:     Pupils: Pupils are equal, round, and reactive to light.  Cardiovascular:     Rate and Rhythm: Normal rate and regular rhythm.     Heart sounds: Normal heart sounds.  Pulmonary:     Effort: Pulmonary effort is normal. No respiratory distress.     Breath sounds: Normal breath sounds. No wheezing or rales.  Chest:     Chest wall: No tenderness.  Abdominal:     General: Bowel sounds are normal.     Palpations: Abdomen is soft.     Tenderness: There is no abdominal tenderness.  There is no guarding or rebound.  Musculoskeletal:        General: Normal range of motion.     Cervical back: Normal range of motion and neck supple.  Lymphadenopathy:  Cervical: No cervical adenopathy.  Skin:    General: Skin is warm and dry.     Findings: No rash.  Neurological:     Mental Status: He is alert and oriented to person, place, and time.     Comments: Motor 5/5 all extremities Sensation grossly intact to LT all extremities CN II-XII grossly intact Gait normal      ED Results / Procedures / Treatments   Labs (all labs ordered are listed, but only abnormal results are displayed) Labs Reviewed  BASIC METABOLIC PANEL - Abnormal; Notable for the following components:      Result Value   CO2 20 (*)    Glucose, Bld 117 (*)    All other components within normal limits  CBC  URINALYSIS, ROUTINE W REFLEX MICROSCOPIC  CBG MONITORING, ED    EKG EKG Interpretation  Date/Time:  Thursday March 14 2020 01:31:32 EDT Ventricular Rate:  77 PR Interval:  156 QRS Duration: 98 QT Interval:  402 QTC Calculation: 454 R Axis:   74 Text Interpretation: Normal sinus rhythm Cannot rule out Anterior infarct , age undetermined Abnormal ECG since last tracing no significant change Reconfirmed by Malvin Johns [phone]) on 03/14/2020 11:28:16 AM   Radiology No results found.  Procedures Procedures (including critical care time)  Medications Ordered in ED Medications - No data to display  ED Course  I have reviewed the triage vital signs and the nursing notes.  Pertinent labs & imaging results that were available during my care of the patient were reviewed by me and considered in my medical decision making (see chart for details).    MDM Rules/Calculators/A&P                          Patient's labs are nonconcerning.  His EKG shows a sinus rhythm with no ectopy.  He has been asymptomatic since arrival to the ED.  He has had a prolonged period of observation given the  high census in the ED.  He was able to ambulate in the ED without symptoms.  No dizziness or palpitations.  I spoke with Dr. Rayann Heman with cardiology who is okay with patient being discharged.  He can have close follow-up with his cardiologist.  Patient acknowledges this.  Return precautions were given. Final Clinical Impression(s) / ED Diagnoses Final diagnoses:  Palpitations    Rx / DC Orders ED Discharge Orders    None       Malvin Johns, MD 03/14/20 1215

## 2020-03-15 ENCOUNTER — Ambulatory Visit (INDEPENDENT_AMBULATORY_CARE_PROVIDER_SITE_OTHER): Payer: Medicare HMO | Admitting: Physician Assistant

## 2020-03-15 ENCOUNTER — Encounter: Payer: Self-pay | Admitting: Physician Assistant

## 2020-03-15 VITALS — BP 112/69 | HR 71 | Ht 77.0 in | Wt 284.8 lb

## 2020-03-15 DIAGNOSIS — I4819 Other persistent atrial fibrillation: Secondary | ICD-10-CM | POA: Diagnosis not present

## 2020-03-15 DIAGNOSIS — I10 Essential (primary) hypertension: Secondary | ICD-10-CM

## 2020-03-15 DIAGNOSIS — R002 Palpitations: Secondary | ICD-10-CM

## 2020-03-15 DIAGNOSIS — E785 Hyperlipidemia, unspecified: Secondary | ICD-10-CM | POA: Diagnosis not present

## 2020-03-15 DIAGNOSIS — R000 Tachycardia, unspecified: Secondary | ICD-10-CM

## 2020-03-15 NOTE — Progress Notes (Signed)
Cardiology Office Note   Date:  03/15/2020   ID:  [name], [name] Jun 03, 1946, MRN [id number]  PCP:  [name], MD Cardiologist:  [name], MD 01/01/2020 Electrphysiologist: [name], MD 07/17/2019 Rosaria Ferries, PA-C   History of Present Illness: Clinton Black is a 74 y.o. male with a history of thoracic aortic aneurysm s/p AVR w/ bioprosthetic valve, aortic graft & modified MAZE w/ bilat pulm vein isolation 10/16/2019, Afib s/p ablation w/ recurrence on amio & Xarelto, DM (diet), HTN, HLD, morbid obesity, BPH, int risk MV 2018 w/ EF 39% but no sig CAD at cath 02/2019, 2019 echo w/ EF 55%, Bell's palsy, venous ulcers  03/14/2020 ER visit for dizziness and palps w/ HR 180, SR in ER >> f/u appt made  Clinton Black presents for cardiology follow up.  He had been out of metoprolol for 2 days, had restarted it. He was dehydrated and had been over-exerting himself. He also had an argument with his wife.   He started feeling cold/clammy and was weak and dizzy, light-headed.  He felt his heart racing. BP 159/149, HR 180.    He realized he needed to calm down. He did some breathing exercises and listened to a book to relax. He did not drink any water.   He felt better and HR decreased to 95. However, he had a brief episode of syncope and EMS was called.   EMS gave him IVF and he started feeling better.  He was hydrated in the ER as well, he was in [place]. He felt tremendously better by the time he left.  He has been having a stuffy head and a little bit of a sore throat. Thinks he has a cold.   COVID status: vaccinated, did not have COVID Past Medical History:  Diagnosis Date  . Arthritis   . Atrial fibrillation ([place])   . BPH (benign prostatic hyperplasia)   . Diabetes mellitus type 2 in obese (HCC)    diet controlled  . HTN (hypertension)   . Hyperlipidemia, mixed   . Insomnia   . Obesity   . Thoracic aortic aneurysm, without rupture (HCC)    4.7  cm per chest ct with contrast 11-04-17    Past Surgical History:  Procedure Laterality Date  . AORTIC VALVE REPLACEMENT N/A 10/16/2019   Procedure: AORTIC VALVE REPLACEMENT (AVR) using Edwards PERIMOUNT Magna Ease 25MM Bioprosthesis Aortic Valve.;  Surgeon: [name], MD;  Location: [place];  Service: Open Heart Surgery;  Laterality: N/A;  Right subclavian artery cannulation.  . AORTIC VALVE REPLACEMENT  10/16/2019   AORTIC VALVE REPLACEMENT (AVR)   . ATRIAL FIBRILLATION ABLATION N/A 04/07/2019   Procedure: ATRIAL FIBRILLATION ABLATION;  Surgeon: [name], MD;  Location: [place] [facility];  Service: Cardiovascular;  Laterality: N/A;  . CARDIOVERSION N/A 07/12/2018   Procedure: CARDIOVERSION;  Surgeon: Thayer Headings, MD;  Location: [facility] ENDOSCOPY;  Service: Cardiovascular;  Laterality: N/A;  . CARDIOVERSION N/A 08/22/2018   Procedure: CARDIOVERSION;  Surgeon: Skeet Latch, MD;  Location: [place];  Service: Cardiovascular;  Laterality: N/A;  . CARDIOVERSION N/A 06/28/2019   Procedure: CARDIOVERSION;  Surgeon: Thayer Headings, MD;  Location: [place];  Service: Cardiovascular;  Laterality: N/A;  . CLIPPING OF ATRIAL APPENDAGE N/A 10/16/2019   Procedure: Clipping Of Atrial Appendage using AtriCure AtriClip Exclusion VLAA 71mm.;  Surgeon: [name], MD;  Location: [place];  Service: Open Heart Surgery;  Laterality: N/A;  .  COLONOSCOPY  2016  . CYST EXCISION     polynomial  . CYSTOSCOPY WITH INSERTION OF UROLIFT N/A 04/18/2018   Procedure: CYSTOSCOPY WITH INSERTION OF UROLIFT;  Surgeon: Cleon Gustin, MD;  Location: [facility];  Service: Urology;  Laterality: N/A;  . KNEE SURGERY Right 2000   arthroscopy  . MAZE N/A 10/16/2019   Procedure: MAZE with bilateral pulmonary vein isolation.;  Surgeon: [name], MD;  Location: [place];  Service: Open Heart Surgery;  Laterality: N/A;  Maze procedure using Atricure OLL2 Isolator clamp.  .  REPLACEMENT ASCENDING AORTA N/A 10/16/2019   Procedure: SUPRA CORONARY REPLACEMENT OF ASCENDING AORTA using Maquet Hemashiel Platinum 34 MM Vascular Graft.;  Surgeon: [name], MD;  Location: [place];  Service: Open Heart Surgery;  Laterality: N/A;  Right subclavian artery cannulation.  Marland Kitchen RIGHT/LEFT HEART CATH AND CORONARY ANGIOGRAPHY N/A 01/30/2019   Procedure: RIGHT/LEFT HEART CATH AND CORONARY ANGIOGRAPHY;  Surgeon: [name], [name], MD;  Location: [place] [facility];  Service: Cardiovascular;  Laterality: N/A;  . TEE WITHOUT CARDIOVERSION N/A 10/16/2019   Procedure: TRANSESOPHAGEAL ECHOCARDIOGRAM (TEE);  Surgeon: [name], MD;  Location: [employer];  Service: Open Heart Surgery;  Laterality: N/A;  . TONSILLECTOMY    . widson teeth extraction      Current Outpatient Medications  Medication Sig Dispense Refill  . alfuzosin (UROXATRAL) 10 MG 24 hr tablet Take 1 tablet (10 mg total) by mouth daily with breakfast. 90 tablet 1  . atorvastatin (LIPITOR) 20 MG tablet TAKE 1 TABLET BY MOUTH EVERYDAY AT BEDTIME 90 tablet 1  . Empagliflozin-metFORMIN HCl ER (SYNJARDY XR) 25-1000 MG TB24 Take 1 tablet by mouth daily. 90 tablet 1  . losartan (COZAAR) 25 MG tablet Take 25 mg by mouth 2 (two) times daily.    . Menthol-Methyl Salicylate (SALONPAS PAIN RELIEF PATCH EX) Apply 1 patch topically daily as needed (back pain).     . metoprolol tartrate (LOPRESSOR) 25 MG tablet Take 0.5 tablets (12.5 mg total) by mouth 2 (two) times daily. 90 tablet 1  . spironolactone (ALDACTONE) 25 MG tablet Take 0.5 tablets (12.5 mg total) by mouth at bedtime. 45 tablet 1  . triazolam (HALCION) 0.25 MG tablet TAKE 1 TABLET (0.25 MG TOTAL) BY MOUTH AT BEDTIME AS NEEDED FOR SLEEP. 30 tablet 3  . XARELTO 20 MG TABS tablet TAKE 1 TABLET EVERY DAY WITH SUPPER 90 tablet 1   No current facility-administered medications for this visit.    Allergies:   Quinolones    Social History:  The patient  reports that he quit smoking  about 25 years ago. His smoking use included cigarettes. He has a 7.50 pack-year smoking history. He has never used smokeless tobacco. He reports previous alcohol use of about 14.0 standard drinks of alcohol per week. He reports current drug use. Drug: Marijuana.   Family History:  The patient's family history includes Aneurysm in his father; Arthritis in his mother; Heart disease in his father; Macular degeneration in his mother; Varicose Veins in his mother.  He indicated that his mother is deceased. He indicated that his father is deceased. He indicated that his maternal grandmother is deceased. He indicated that his maternal grandfather is deceased. He indicated that his paternal grandmother is deceased. He indicated that his paternal grandfather is deceased. He indicated that the status of his neg hx is unknown.   ROS:  Please see the history of present illness. All other systems are reviewed and  negative.    PHYSICAL EXAM: VS:  BP 112/69   Pulse 71   Ht 6\' 5"  (1.956 m)   Wt 284 lb 12.8 oz (129.2 kg)   SpO2 96%   BMI 33.77 kg/m  , BMI Body mass index is 33.77 kg/m. GEN: Well nourished, well developed, male in no acute distress HEENT: normal for age  Neck: no JVD, no carotid bruit, no masses Cardiac: RRR; 2/6 murmur, no rubs, or gallops Respiratory:  clear to auscultation bilaterally, normal work of breathing GI: soft, nontender, nondistended, + BS MS: no deformity or atrophy; no edema; distal pulses are 2+ in all 4 extremities  Skin: warm and dry, no rash, venous ulcers appear to be healing, dressings not disturbed Neuro:  Strength and sensation are intact Psych: euthymic mood, full affect   EKG:  EKG is ordered today. The ECG from the ER 09/30 demonstrates SR, HR 77, no acute ischemic changes  ECHO: 12/01/2019 1. Left ventricular ejection fraction, by estimation, is 55 to 60%. The  left ventricle has normal function. The left ventricle has no regional  wall motion  abnormalities. There is moderate asymmetric left ventricular  hypertrophy of the basal-septal  segment. Left ventricular diastolic parameters are consistent with Grade I diastolic dysfunction (impaired relaxation).  2. Right ventricular systolic function is normal. The right ventricular  size is normal.  3. Left atrial size was mildly dilated.  4. The mitral valve is normal in structure. Trivial mitral valve  regurgitation. No evidence of mitral stenosis.  5. Normal transaortic gradients, no paravalvular leak. The aortic valve  has been repaired/replaced. Aortic valve regurgitation is not visualized.  No aortic stenosis is present. There is a 25 mm Edwards pericardial tissue valve present in the aortic  position. Aortic valve mean gradient measures 8.0 mmHg.  6. Aortic root/ascending aorta has been repaired/replaced and S/P  supracoronary replacement of the ascending aorta with a 34 mm Hemashield platinum graft. There is moderate dilatation of the ascending aorta measuring 47 mm.  7. The inferior vena cava is normal in size with greater than 50%  respiratory variability, suggesting right atrial pressure of 3 mmHg.   CATH: 01/30/2019 (pre AVR)  LV end diastolic pressure is normal.  There is moderate (3+) aortic regurgitation.   1. Normal coronary anatomy. Left dominant circulation 2. Normal right heart pressures 3. Normal cardiac output.  4. Moderate aortic insufficiency. No significant stenosis 5. Ascending thoracic aortic aneurysm.   Plan: will review with Dr Servando Snare. If Aortic surgery is not planned in the near future would recommend referral to EP for consideration of ablation of Afib.    MONITOR: 05/19/2019 Max 146 bpm 03:30pm, 11/23 Min 63 bpm 07:29am, 11/22 Avg 90 bpm 5.1% PVCs 100% atrial fibrillation burden  Recent Labs: 05/29/2019: Pro B Natriuretic peptide (BNP) 95.0 09/06/2019: TSH 4.29 10/12/2019: ALT 26 10/17/2019: Magnesium 2.1 03/14/2020: BUN 18;  Creatinine, Ser 1.20; Hemoglobin 14.0; Platelets 199; Potassium 4.3; Sodium 141  CBC    Component Value Date/Time   WBC 9.2 03/14/2020 0207   RBC 5.08 03/14/2020 0207   HGB 14.0 03/14/2020 0207   HGB 13.3 11/10/2019 1541   HCT 44.6 03/14/2020 0207   HCT 41.3 11/10/2019 1541   PLT 199 03/14/2020 0207   PLT 253 11/10/2019 1541   MCV 87.8 03/14/2020 0207   MCV 90 11/10/2019 1541   MCH 27.6 03/14/2020 0207   MCHC 31.4 03/14/2020 0207   RDW 14.4 03/14/2020 0207   RDW 13.2 11/10/2019 1541  LYMPHSABS 2.0 09/06/2019 1121   LYMPHSABS 2.4 01/25/2019 1358   MONOABS 0.5 09/06/2019 1121   EOSABS 0.4 09/06/2019 1121   EOSABS 0.4 01/25/2019 1358   BASOSABS 0.1 09/06/2019 1121   BASOSABS 0.1 01/25/2019 1358   CMP Latest Ref Rng & Units 03/14/2020 11/10/2019 10/23/2019  Glucose 70 - 99 mg/dL 117(H) 109(H) 113(H)  BUN 8 - 23 mg/dL 18 36(H) 17  Creatinine 0.61 - 1.24 mg/dL 1.20 1.53(H) 1.03  Sodium 135 - 145 mmol/L 141 139 140  Potassium 3.5 - 5.1 mmol/L 4.3 4.6 3.7  Chloride 98 - 111 mmol/L 110 102 106  CO2 22 - 32 mmol/L 20(L) 19(L) 26  Calcium 8.9 - 10.3 mg/dL 9.5 9.6 8.5(L)  Total Protein 6.5 - 8.1 g/dL - - -  Total Bilirubin 0.3 - 1.2 mg/dL - - -  Alkaline Phos 38 - 126 U/L - - -  AST 15 - 41 U/L - - -  ALT 0 - 44 U/L - - -     Lipid Panel Lab Results  Component Value Date   CHOL 148 05/29/2019   HDL 34.00 (L) 05/29/2019   LDLCALC 48 06/01/2018   LDLDIRECT 78.0 05/29/2019   TRIG 255.0 (H) 05/29/2019   CHOLHDL 4 05/29/2019      Wt Readings from Last 3 Encounters:  03/15/20 284 lb 12.8 oz (129.2 kg)  01/01/20 293 lb (132.9 kg)  12/28/19 293 lb (132.9 kg)     Other studies Reviewed: Additional studies/ records that were reviewed today include: Office notes, hospital records and testing.  ASSESSMENT AND PLAN:  1.  PAF, tachycardia, palpitations:  - I suspect he had an episode of atrial fib, RVR 2nd to a "perfect storm" of overexertion, dehydration, emotional stress,  missed meds - EMS ECG not available, if he was in Afib, he spontaneously converted to SR prior to ER ECG - He missed a couple of BB doses, but has not missed any doses of Xarelto - hx resting bradycardia, so will not try to increase BB - will get event monitor to see if he is having breakthrough Afib - showed him KARDIA mobile site, he can decide if he wants one of them as well.  - CHA2DS2-VASc = 3 (age x 1, HTN, DM), continue Xarelto - f/u with Dr [name] after the monitor.  2. HTN - BP/HR well-controlled on losartan 25 mg bid, spiro 12.5 mg qd and metoprolol 12.5 mg bid  3. Hyperlipidemia - managed by PCP - last ck 05/2019 w/ LDL78, HDL 34 - on atorva 20 mg  - f/u with PCP as scheduled   Current medicines are reviewed at length with the patient today.  The patient does not have concerns regarding medicines.  The following changes have been made:  no change  Labs/ tests ordered today include:  No orders of the defined types were placed in this encounter.   Disposition:   FU with [name], MD  Signed, Rosaria Ferries, PA-C  03/15/2020 2:36 PM    [place] Phone: [phone]; Fax: [phone]

## 2020-03-15 NOTE — Patient Instructions (Addendum)
Medication Instructions:  No medication changes  *If you need a refill on your cardiac medications before your next appointment, please call your pharmacy*   Lab Work: Not needed   Testing/Procedures:  Your physician has recommended that you wear an event monitor 30 days. Event monitors are medical devices that record the heart's electrical activity. Doctors most often [place] these monitors to diagnose arrhythmias. Arrhythmias are problems with the speed or rhythm of the heartbeat. The monitor is a small, portable device. You can wear one while you do your normal daily activities. This is usually used to diagnose what is causing palpitations/syncope (passing out).    Follow-Up: At [facility] you and your health needs are our priority.  As part of our continuing mission to provide you with exceptional heart care, we have created designated Provider Care Teams.  These Care Teams include your primary Cardiologist (physician) and Advanced Practice Providers (APPs -  Physician Assistants and Nurse Practitioners) who all work together to provide you with the care you need, when you need it.  We recommend signing up for the patient portal called "MyChart".  Sign up information is provided on this After Visit Summary.  MyChart is used to connect with patients for Virtual Visits (Telemedicine).  Patients are able to view lab/test results, encounter notes, upcoming appointments, etc.  Non-urgent messages can be sent to your provider as well.   To learn more about what you can do with MyChart, go to [email or web address].    Your next appointment:   2 month(s)  The format for your next appointment:   In Person  Provider:   [name], MD   Other Instructions  recommend you get are tested for covid   KardiaMobile EKG Monitor - KardiaMobile by [employer]   [employer] Cardiac Event Monitor Instructions Your physician has requested you wear your cardiac event monitor for __30___ days,  (1-30). [employer] may call or text to confirm a shipping address. The monitor will be sent to a land address via UPS. [employer] will not ship a monitor to a PO BOX. It typically takes 3-5 days to receive your monitor after it has been enrolled. [employer] will assist with USPS tracking if your package is delayed. The telephone number for [employer] is [phone]. Once you have received your monitor, please review the enclosed instructions. Instruction tutorials can also be viewed under help and settings on the enclosed cell phone. Your monitor has already been registered assigning a specific monitor serial # to you.  Applying the monitor Remove cell phone from case and turn it on. The cell phone works as Dealer and needs to be within [employer] of you at all times. The cell phone will need to be charged on a daily basis. We recommend you plug the cell phone into the enclosed charger at your bedside table every night.  Monitor batteries: You will receive two monitor batteries labelled #1 and #2. These are your recorders. Plug battery #2 onto the second connection on the enclosed charger. Keep one battery on the charger at all times. This will keep the monitor battery deactivated. It will also keep it fully charged for when you need to switch your monitor batteries. A small light will be blinking on the battery emblem when it is charging. The light on the battery emblem will remain on when the battery is fully charged.  Open package of a Monitor strip. Insert battery #1 into black hood on strip and gently squeeze monitor battery onto connection as  indicated in instruction booklet. Set aside while preparing skin.  Choose location for your strip, vertical or horizontal, as indicated in the instruction booklet. Shave to remove all hair from location. There cannot be any lotions, oils, powders, or colognes on skin where monitor is to be applied. Wipe skin clean with enclosed Saline  wipe. Dry skin completely.  Peel paper labeled #1 off the back of the Monitor strip exposing the adhesive. Place the monitor on the chest in the vertical or horizontal position shown in the instruction booklet. One arrow on the monitor strip must be pointing upward. Carefully remove paper labeled #2, attaching remainder of strip to your skin. Try not to create any folds or wrinkles in the strip as you apply it.  Firmly press and release the circle in the center of the monitor battery. You will hear a small beep. This is turning the monitor battery on. The heart emblem on the monitor battery will light up every 5 seconds if the monitor battery in turned on and connected to the patient securely. Do not push and hold the circle down as this turns the monitor battery off. The cell phone will locate the monitor battery. A screen will appear on the cell phone checking the connection of your monitor strip. This may read poor connection initially but change to good connection within the next minute. Once your monitor accepts the connection you will hear a series of 3 beeps followed by a climbing crescendo of beeps. A screen will appear on the cell phone showing the two monitor strip placement options. Touch the picture that demonstrates where you applied the monitor strip.  Your monitor strip and battery are waterproof. You are able to shower, bathe, or swim with the monitor on. They just ask you do not submerge deeper than 3 feet underwater. We recommend removing the monitor if you are swimming in a lake, river, or ocean.  Your monitor battery will need to be switched to a fully charged monitor battery approximately once a week. The cell phone will alert you of an action which needs to be made.  On the cell phone, tap for details to reveal connection status, monitor battery status, and cell phone battery status. The green dots indicates your monitor is in good status. A red dot indicates there is  something that needs your attention.  To record a symptom, click the circle on the monitor battery. In 30-60 seconds a list of symptoms will appear on the cell phone. Select your symptom and tap save. Your monitor will record a sustained or significant arrhythmia regardless of you clicking the button. Some patients do not feel the heart rhythm irregularities. [employer] will notify us of any serious or critical events.  Refer to instruction booklet for instructions on switching batteries, changing strips, the Do not disturb or Pause features, or any additional questions.  Call [employer] at [phone], to confirm your monitor is transmitting and record your baseline. They will answer any questions you may have regarding the monitor instructions at that time.  Returning the monitor to [place] all equipment back into blue box. Peel off strip of paper to expose adhesive and close box securely. There is a prepaid UPS shipping label on this box. Drop in a UPS drop box, or at a UPS facility like [employer]. You may also contact [employer] to arrange UPS to pick up monitor package at your home.

## 2020-03-20 ENCOUNTER — Emergency Department (HOSPITAL_COMMUNITY): Payer: Medicare HMO

## 2020-03-20 ENCOUNTER — Observation Stay (HOSPITAL_COMMUNITY)
Admission: EM | Admit: 2020-03-20 | Discharge: 2020-03-22 | Disposition: A | Discharge status: Home / Self Care | Payer: Medicare HMO | Attending: Internal Medicine | Admitting: Internal Medicine

## 2020-03-20 ENCOUNTER — Encounter (HOSPITAL_COMMUNITY): Payer: Self-pay | Admitting: Emergency Medicine

## 2020-03-20 DIAGNOSIS — R42 Dizziness and giddiness: Secondary | ICD-10-CM | POA: Diagnosis not present

## 2020-03-20 DIAGNOSIS — I700 Atherosclerosis of aorta: Secondary | ICD-10-CM | POA: Diagnosis not present

## 2020-03-20 DIAGNOSIS — E118 Type 2 diabetes mellitus with unspecified complications: Secondary | ICD-10-CM

## 2020-03-20 DIAGNOSIS — Z23 Encounter for immunization: Secondary | ICD-10-CM | POA: Insufficient documentation

## 2020-03-20 DIAGNOSIS — I5032 Chronic diastolic (congestive) heart failure: Secondary | ICD-10-CM | POA: Diagnosis not present

## 2020-03-20 DIAGNOSIS — I712 Thoracic aortic aneurysm, without rupture, unspecified: Secondary | ICD-10-CM | POA: Diagnosis present

## 2020-03-20 DIAGNOSIS — D171 Benign lipomatous neoplasm of skin and subcutaneous tissue of trunk: Secondary | ICD-10-CM | POA: Diagnosis not present

## 2020-03-20 DIAGNOSIS — Z87891 Personal history of nicotine dependence: Secondary | ICD-10-CM | POA: Diagnosis not present

## 2020-03-20 DIAGNOSIS — R55 Syncope and collapse: Principal | ICD-10-CM | POA: Diagnosis present

## 2020-03-20 DIAGNOSIS — Z95828 Presence of other vascular implants and grafts: Secondary | ICD-10-CM

## 2020-03-20 DIAGNOSIS — I110 Hypertensive heart disease with heart failure: Secondary | ICD-10-CM | POA: Insufficient documentation

## 2020-03-20 DIAGNOSIS — N281 Cyst of kidney, acquired: Secondary | ICD-10-CM | POA: Diagnosis not present

## 2020-03-20 DIAGNOSIS — Z20822 Contact with and (suspected) exposure to covid-19: Secondary | ICD-10-CM | POA: Insufficient documentation

## 2020-03-20 DIAGNOSIS — M4312 Spondylolisthesis, cervical region: Secondary | ICD-10-CM | POA: Diagnosis not present

## 2020-03-20 DIAGNOSIS — E119 Type 2 diabetes mellitus without complications: Secondary | ICD-10-CM | POA: Diagnosis not present

## 2020-03-20 DIAGNOSIS — M47812 Spondylosis without myelopathy or radiculopathy, cervical region: Secondary | ICD-10-CM | POA: Diagnosis not present

## 2020-03-20 DIAGNOSIS — Z79899 Other long term (current) drug therapy: Secondary | ICD-10-CM | POA: Diagnosis not present

## 2020-03-20 DIAGNOSIS — W19XXXA Unspecified fall, initial encounter: Secondary | ICD-10-CM

## 2020-03-20 DIAGNOSIS — T1490XA Injury, unspecified, initial encounter: Secondary | ICD-10-CM

## 2020-03-20 DIAGNOSIS — I10 Essential (primary) hypertension: Secondary | ICD-10-CM | POA: Diagnosis present

## 2020-03-20 DIAGNOSIS — M5489 Other dorsalgia: Secondary | ICD-10-CM | POA: Diagnosis not present

## 2020-03-20 DIAGNOSIS — Z86010 Personal history of colonic polyps: Secondary | ICD-10-CM | POA: Diagnosis not present

## 2020-03-20 DIAGNOSIS — S299XXA Unspecified injury of thorax, initial encounter: Secondary | ICD-10-CM | POA: Diagnosis not present

## 2020-03-20 DIAGNOSIS — Z7901 Long term (current) use of anticoagulants: Secondary | ICD-10-CM | POA: Insufficient documentation

## 2020-03-20 DIAGNOSIS — R52 Pain, unspecified: Secondary | ICD-10-CM | POA: Diagnosis not present

## 2020-03-20 DIAGNOSIS — M2578 Osteophyte, vertebrae: Secondary | ICD-10-CM | POA: Diagnosis not present

## 2020-03-20 LAB — URINALYSIS, ROUTINE W REFLEX MICROSCOPIC
Bacteria, UA: NONE SEEN
Bilirubin Urine: NEGATIVE
Glucose, UA: >=500 mg/dL — AB
Hgb urine dipstick: NEGATIVE
Ketones, ur: 20 mg/dL — AB
Leukocytes,Ua: NEGATIVE
Nitrite: NEGATIVE
Protein, ur: NEGATIVE mg/dL
Specific Gravity, Urine: 1.044 — ABNORMAL HIGH (ref 1.005–1.030)
pH: 5 (ref 5.0–8.0)

## 2020-03-20 LAB — CBC WITH DIFFERENTIAL/PLATELET
Abs Immature Granulocytes: 0.03 10*3/uL (ref 0.00–0.07)
Basophils Absolute: 0.1 10*3/uL (ref 0.0–0.1)
Basophils Relative: 1 %
Eosinophils Absolute: 0.4 10*3/uL (ref 0.0–0.5)
Eosinophils Relative: 5 %
HCT: 43.3 % (ref 39.0–52.0)
Hemoglobin: 14.1 g/dL (ref 13.0–17.0)
Immature Granulocytes: 0 %
Lymphocytes Relative: 15 %
Lymphs Abs: 1.3 10*3/uL (ref 0.7–4.0)
MCH: 28.1 pg (ref 26.0–34.0)
MCHC: 32.6 g/dL (ref 30.0–36.0)
MCV: 86.4 fL (ref 80.0–100.0)
Monocytes Absolute: 0.5 10*3/uL (ref 0.1–1.0)
Monocytes Relative: 6 %
Neutro Abs: 6.4 10*3/uL (ref 1.7–7.7)
Neutrophils Relative %: 73 %
Platelets: 199 10*3/uL (ref 150–400)
RBC: 5.01 MIL/uL (ref 4.22–5.81)
RDW: 14.4 % (ref 11.5–15.5)
WBC: 8.8 10*3/uL (ref 4.0–10.5)
nRBC: 0 % (ref 0.0–0.2)

## 2020-03-20 LAB — LACTIC ACID, PLASMA: Lactic Acid, Venous: 2.1 mmol/L (ref 0.5–1.9)

## 2020-03-20 LAB — I-STAT CHEM 8, ED
BUN: 18 mg/dL (ref 8–23)
Calcium, Ion: 1.12 mmol/L — ABNORMAL LOW (ref 1.15–1.40)
Chloride: 108 mmol/L (ref 98–111)
Creatinine, Ser: 1 mg/dL (ref 0.61–1.24)
Glucose, Bld: 114 mg/dL — ABNORMAL HIGH (ref 70–99)
HCT: 42 % (ref 39.0–52.0)
Hemoglobin: 14.3 g/dL (ref 13.0–17.0)
Potassium: 4.1 mmol/L (ref 3.5–5.1)
Sodium: 141 mmol/L (ref 135–145)
TCO2: 19 mmol/L — ABNORMAL LOW (ref 22–32)

## 2020-03-20 LAB — COMPREHENSIVE METABOLIC PANEL
ALT: 17 U/L (ref 0–44)
AST: 17 U/L (ref 15–41)
Albumin: 4 g/dL (ref 3.5–5.0)
Alkaline Phosphatase: 84 U/L (ref 38–126)
Anion gap: 12 (ref 5–15)
BUN: 15 mg/dL (ref 8–23)
CO2: 21 mmol/L — ABNORMAL LOW (ref 22–32)
Calcium: 8.9 mg/dL (ref 8.9–10.3)
Chloride: 107 mmol/L (ref 98–111)
Creatinine, Ser: 1.01 mg/dL (ref 0.61–1.24)
GFR calc non Af Amer: >60 mL/min (ref >60)
Glucose, Bld: 120 mg/dL — ABNORMAL HIGH (ref 70–99)
Potassium: 4.2 mmol/L (ref 3.5–5.1)
Sodium: 140 mmol/L (ref 135–145)
Total Bilirubin: 0.9 mg/dL (ref 0.3–1.2)
Total Protein: 6.2 g/dL — ABNORMAL LOW (ref 6.5–8.1)

## 2020-03-20 LAB — SAMPLE TO BLOOD BANK

## 2020-03-20 LAB — PROTIME-INR
INR: 1.5 — ABNORMAL HIGH (ref 0.8–1.2)
Prothrombin Time: 18 seconds — ABNORMAL HIGH (ref 11.4–15.2)

## 2020-03-20 LAB — TROPONIN I (HIGH SENSITIVITY)
Troponin I (High Sensitivity): 10 ng/L (ref <18)
Troponin I (High Sensitivity): 10 ng/L (ref <18)

## 2020-03-20 LAB — ETHANOL: Alcohol, Ethyl (B): <10 mg/dL (ref <10)

## 2020-03-20 IMAGING — CT CT T SPINE W/O CM
3 of 4 series · 11 of 33 positions shown, 13 images · IV contrast (Omni 300)
Comparison: None

CLINICAL DATA: Single episode with fall, back pain

EXAM:
CT Thoracic Spine with contrast
TECHNIQUE: Multiplanar CT images of the thoracic spine were reconstructed from
contemporary CT of the Chest.
CONTRAST:  No additional

[Series 7: t-spine soft tissue cor · coronal · 0.37mm/px · 3 of 96 slices shown]
[im 20/96  bone]
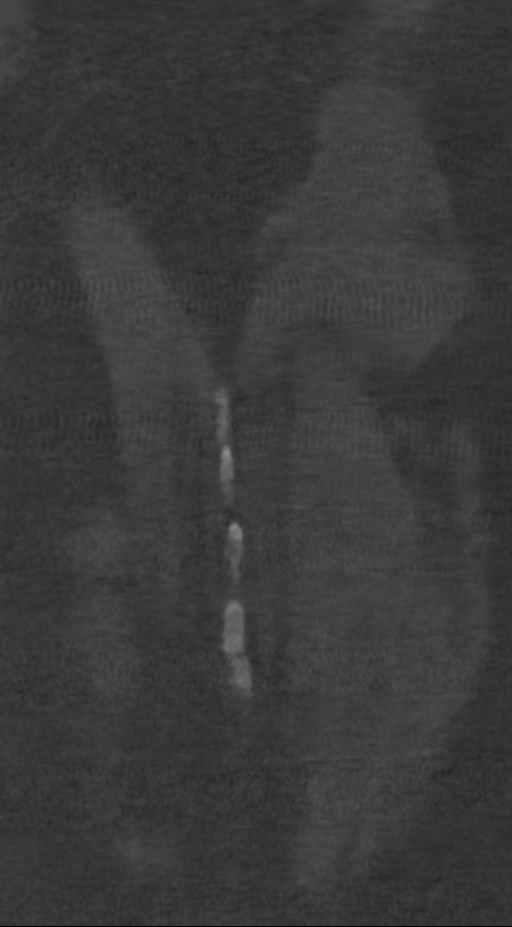
[im 39/96  bone]
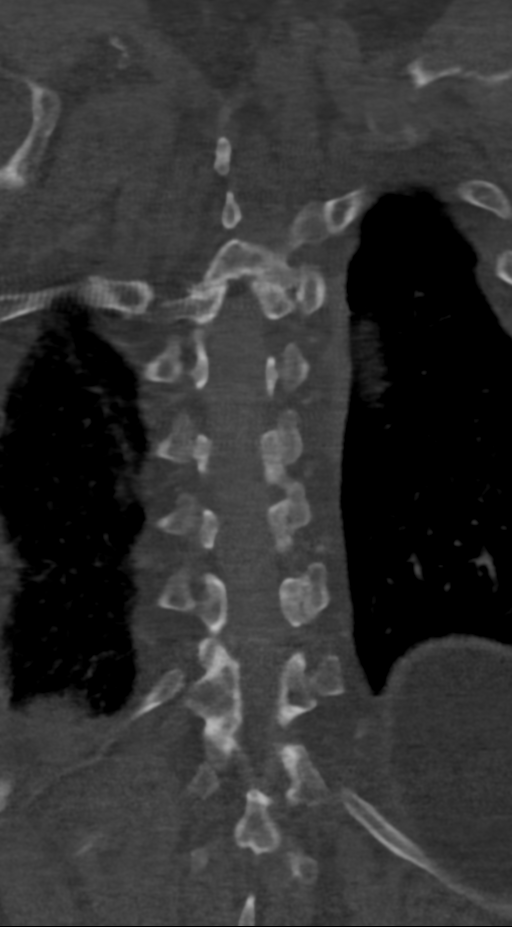
[im 58/96  bone]
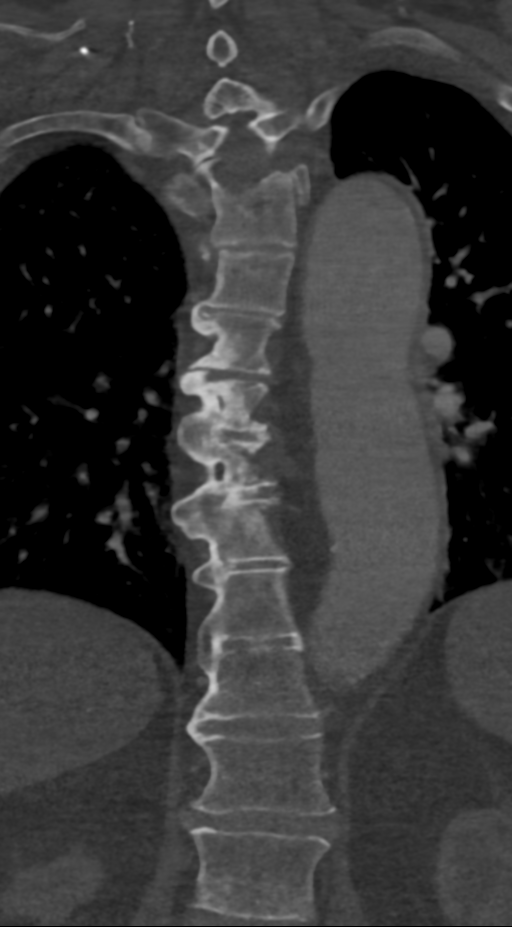

[Series 8: t-spine soft tissue sag · sagittal · 0.37mm/px · 5 of 96 slices shown, 6 images]
[im 32/96  bone]
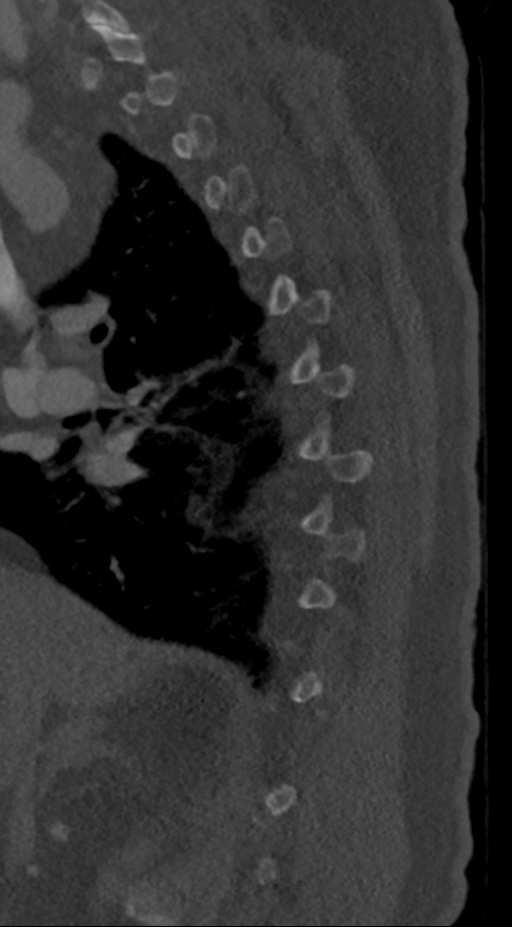
[im 40/96  bone]
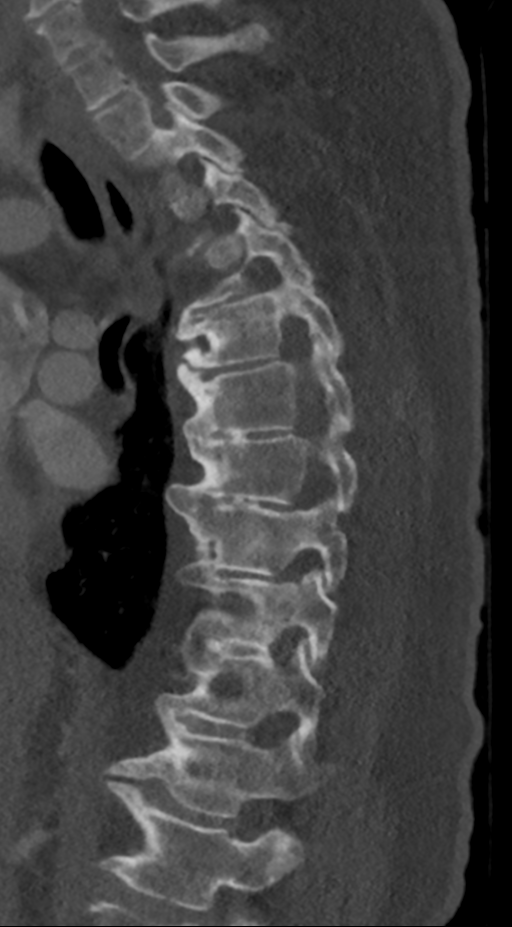
[im 48/96  soft-tissue]
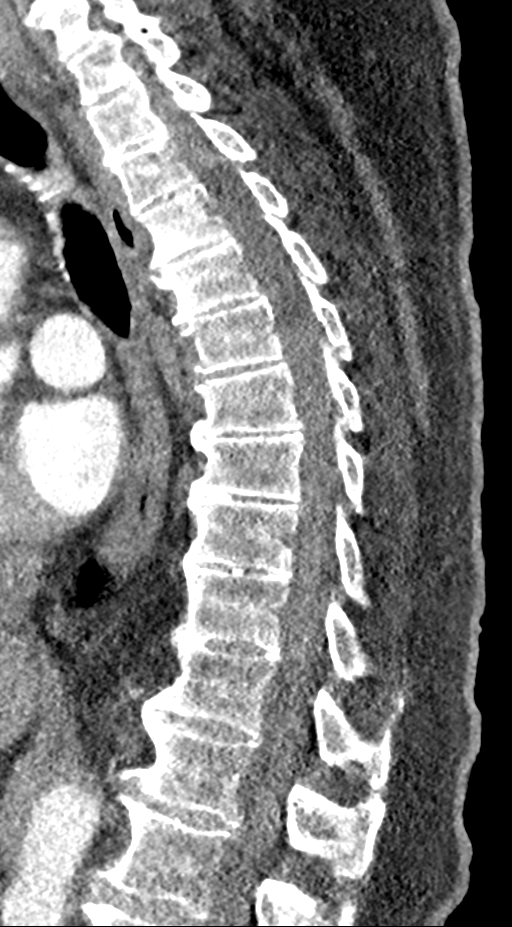
[im 48/96  bone]
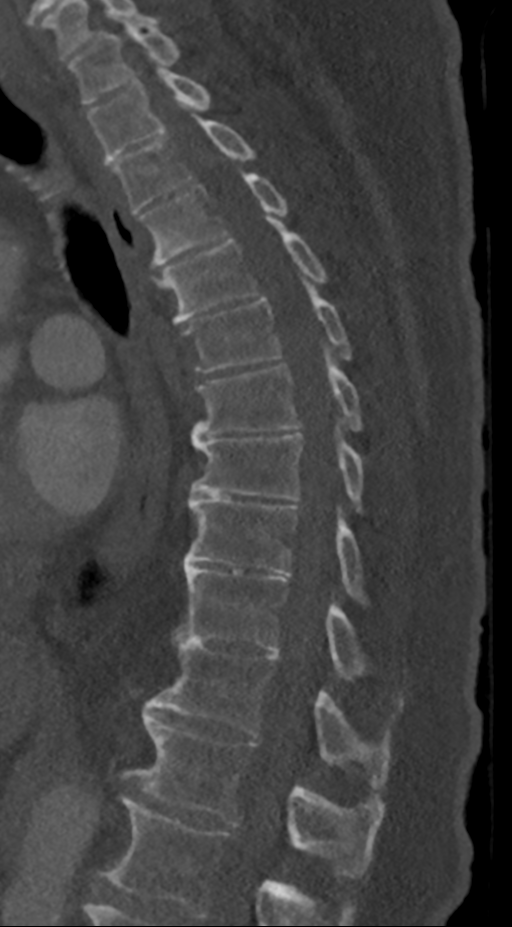
[im 56/96  bone]
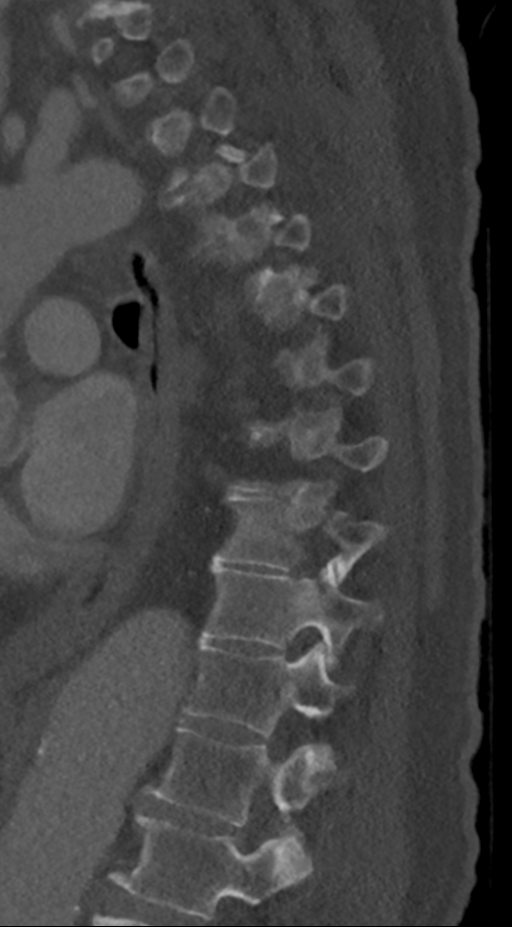
[im 64/96  bone]
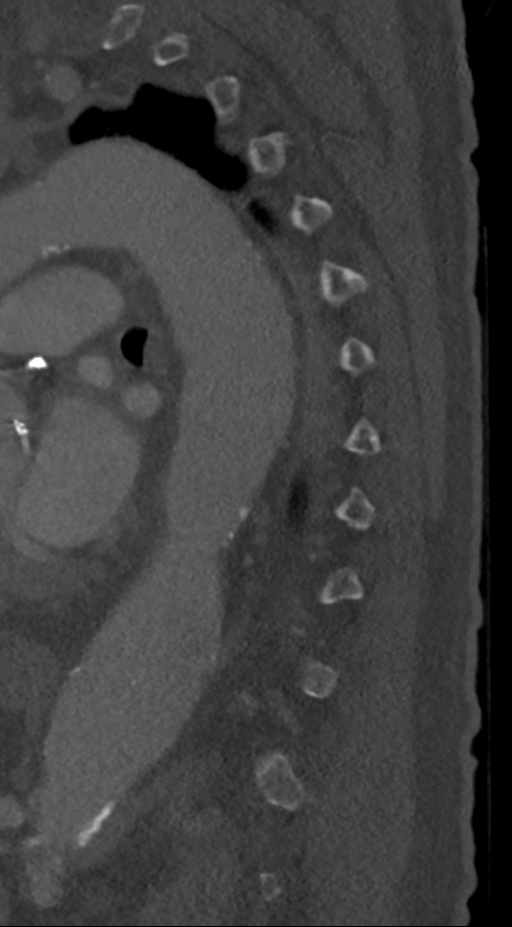

[Series 11: t-spine soft tissue thin · axial · 0.37mm/px · z∈[-567,-360]mm · 3 of 576 slices shown, 4 images]
[im 116/576  soft-tissue]
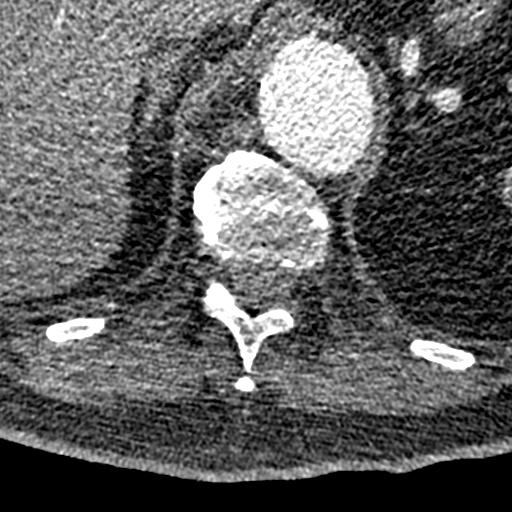
[im 116/576  bone]
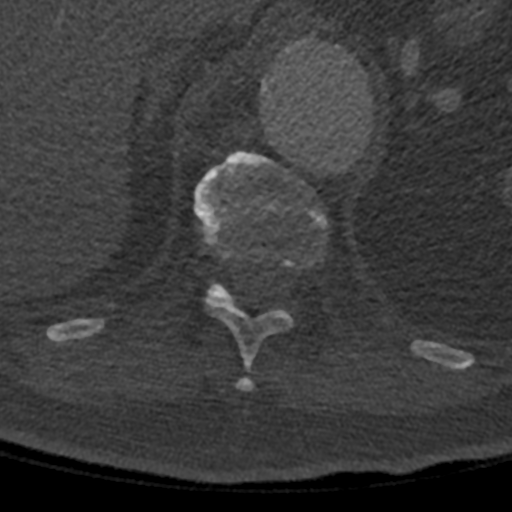
[im 288/576  bone]
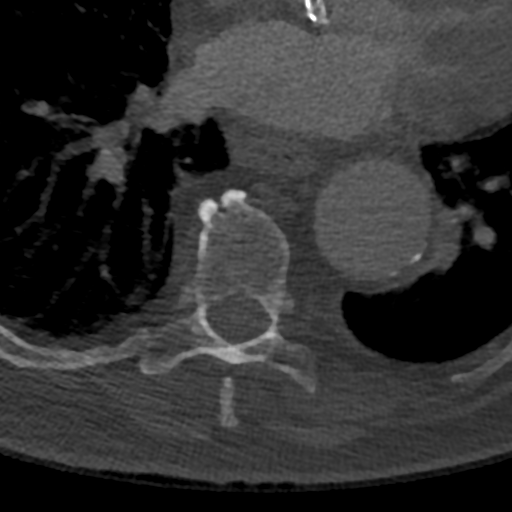
[im 461/576  bone]
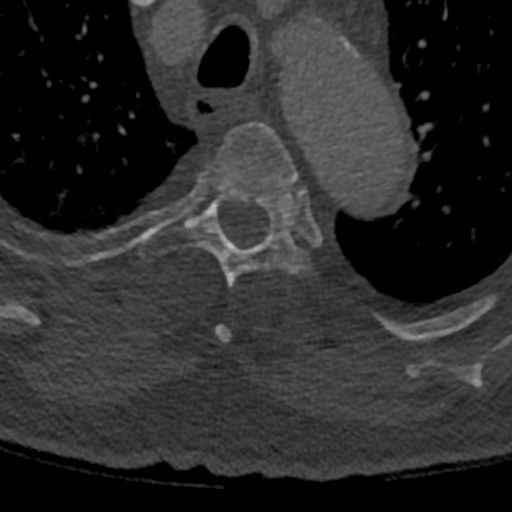

[11 of 33 positions shown; findings below may reference images not displayed]

FINDINGS: Alignment: Anteroposterior alignment is maintained.

Vertebrae: Vertebral body heights are maintained apart from
degenerative endplate irregularity. No acute fracture.

Paraspinal and other soft tissues: Unremarkable. Extra-spinal
findings are better evaluated on concurrent dedicated imaging.

Disc levels: Multilevel disc space narrowing, small endplate
osteophytes, and facet hypertrophy. No high-grade osseous
encroachment on the spinal canal.
IMPRESSION: No acute thoracic spine fracture.

## 2020-03-20 IMAGING — DX DG CHEST 1V PORT
1 series · 1 of 1 positions shown · non-contrast
Comparison: [DATE]

CLINICAL DATA: Status post fall.  Hit is head.

EXAM:
PORTABLE CHEST 1 VIEW

[chest ap]
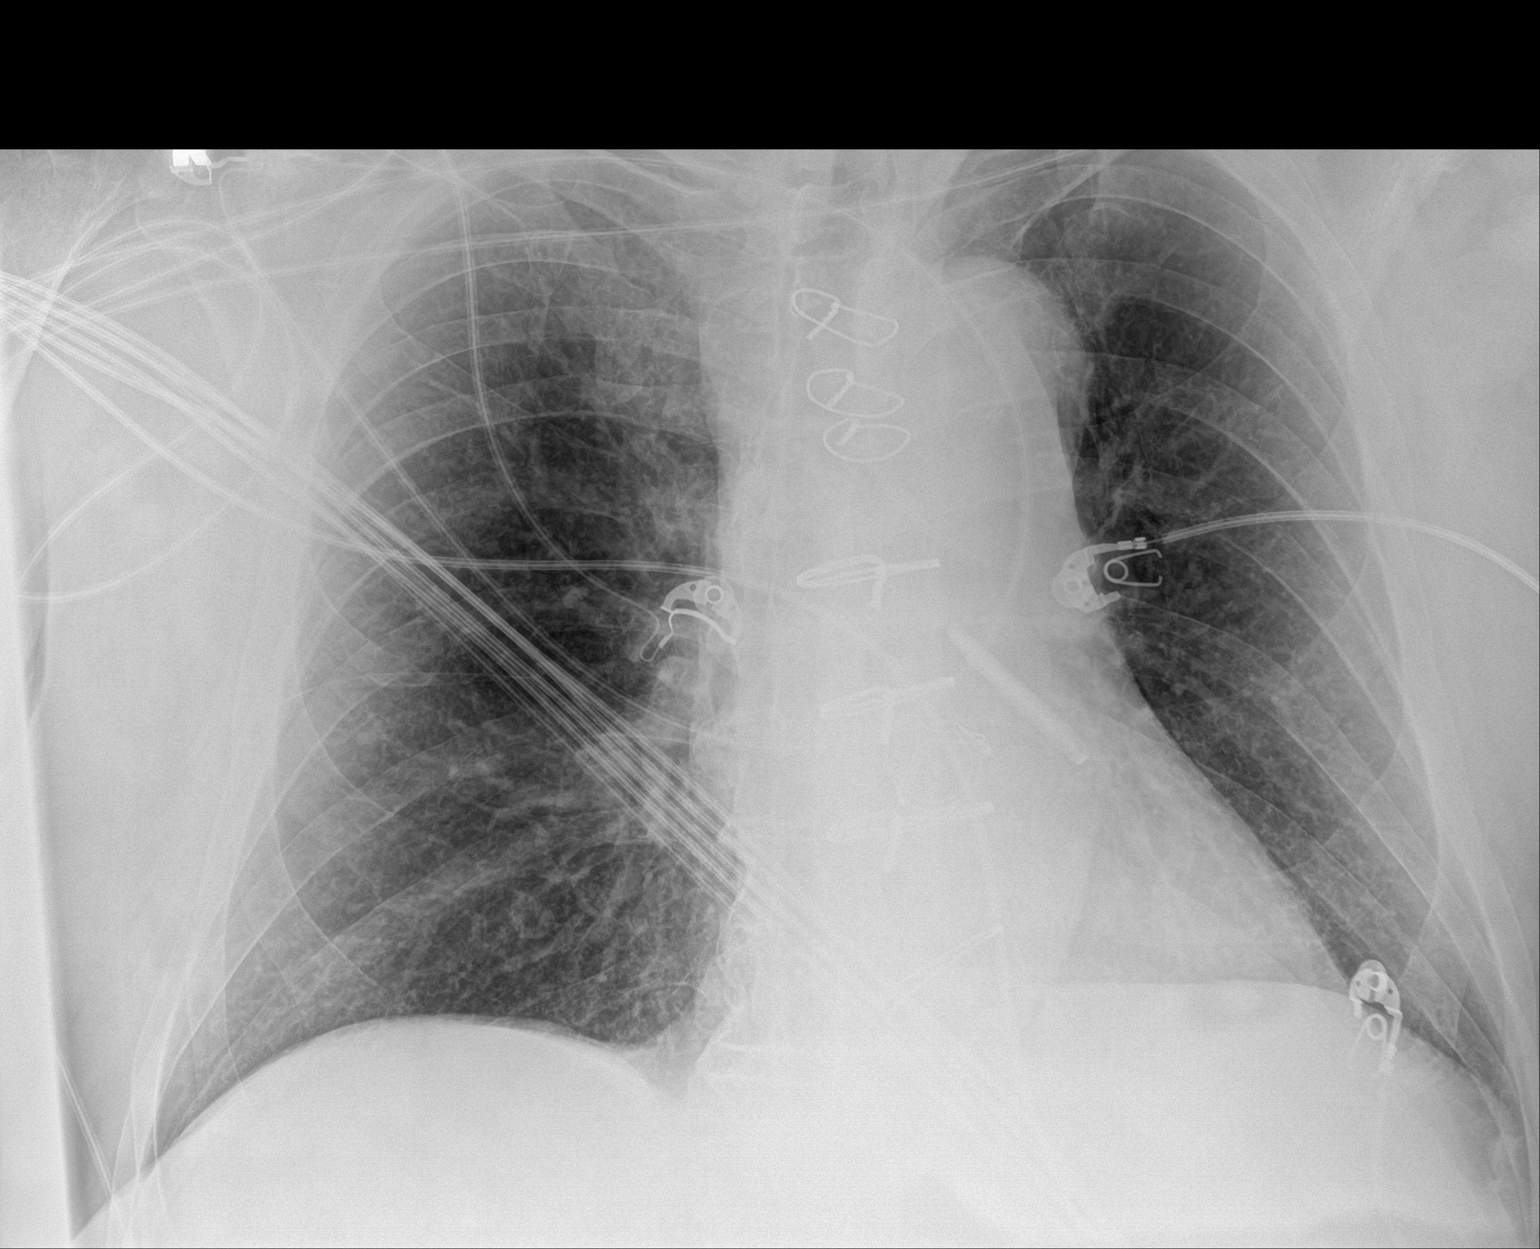

[1 of 1 positions shown; findings below may reference images not displayed]

FINDINGS: No focal consolidation. No pleural effusion or pneumothorax. Heart
and mediastinal contours are unremarkable. Prior median sternotomy.

No acute osseous abnormality.
IMPRESSION: No active disease.

## 2020-03-20 IMAGING — CT CT ANGIO CHEST-ABD-PELV FOR DISSECTION W/ AND WO/W CM
2 of 8 series · 11 of 36 positions shown, 15 images · IV contrast (Omni 300)
Comparison: [DATE], [DATE]

CLINICAL DATA: Recent syncopal episode and history of abdominal
pain and chest pain with fall, initial encounter

EXAM:
CT ANGIOGRAPHY CHEST, ABDOMEN AND PELVIS
TECHNIQUE: Non-contrast CT of the chest was initially obtained.

[Series 10: dissection 2mm · axial · 0.89mm/px · z∈[-934,-300]mm · 10 of 371 slices shown, 13 images]
[im 27/371  mediastinal]
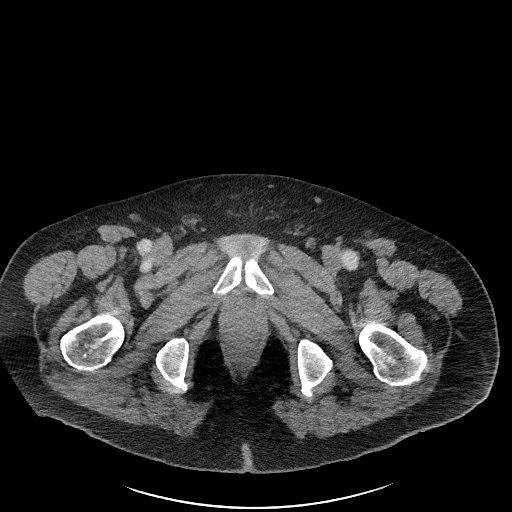
[im 27/371  bone]
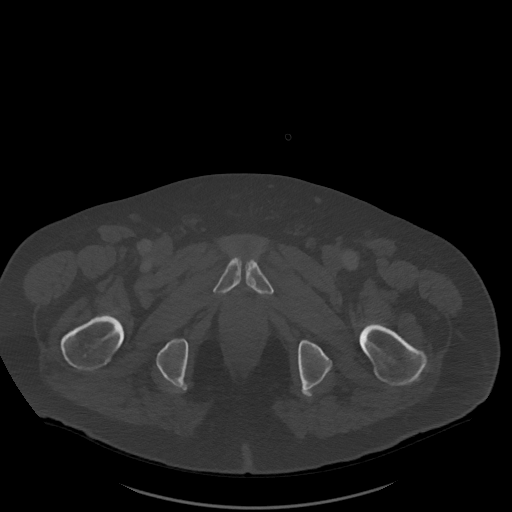
[im 80/371  mediastinal]
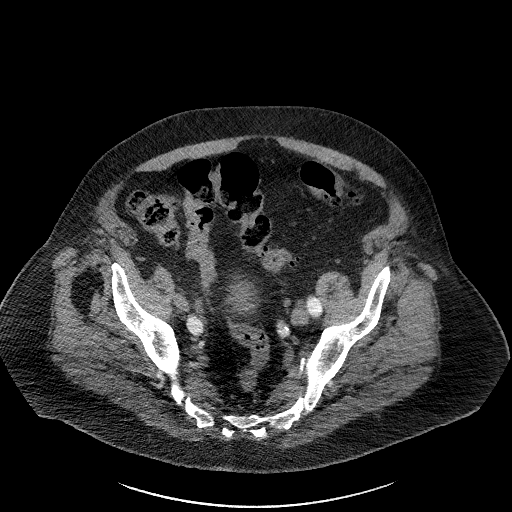
[im 133/371  mediastinal]
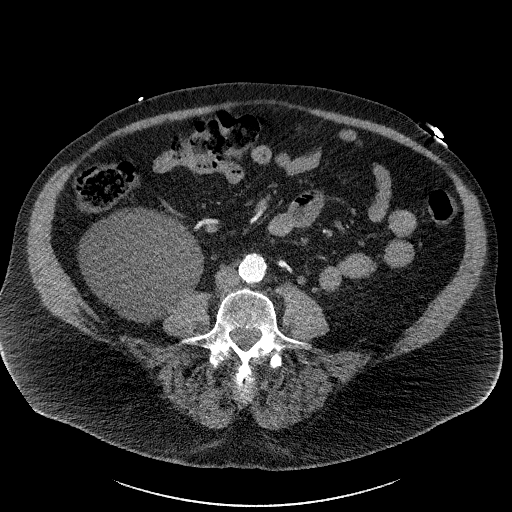
[im 159/371  mediastinal]
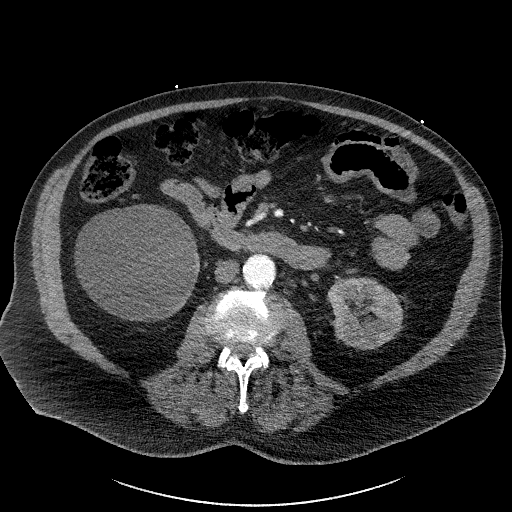
[im 212/371  mediastinal]
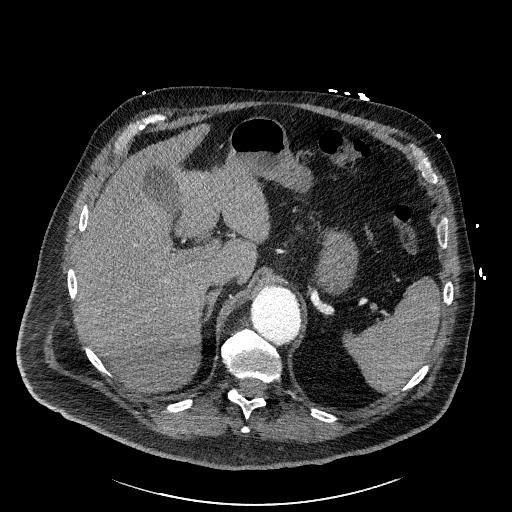
[im 238/371  mediastinal]
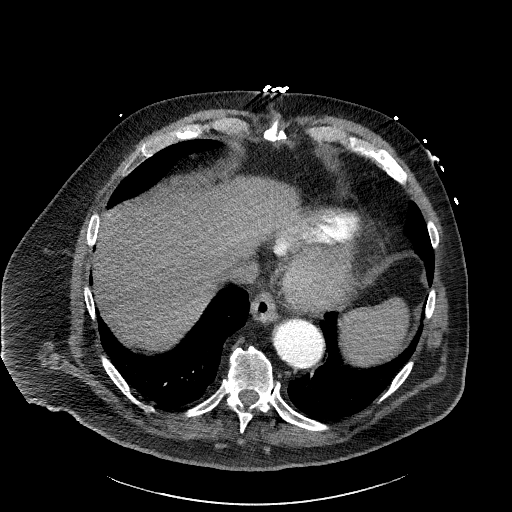
[im 265/371  lung]
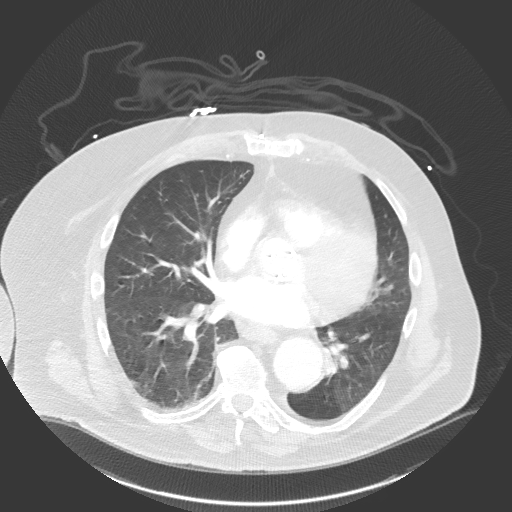
[im 291/371  mediastinal]
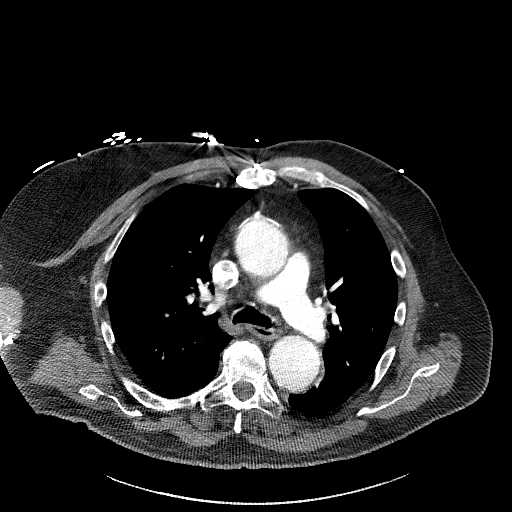
[im 291/371  lung]
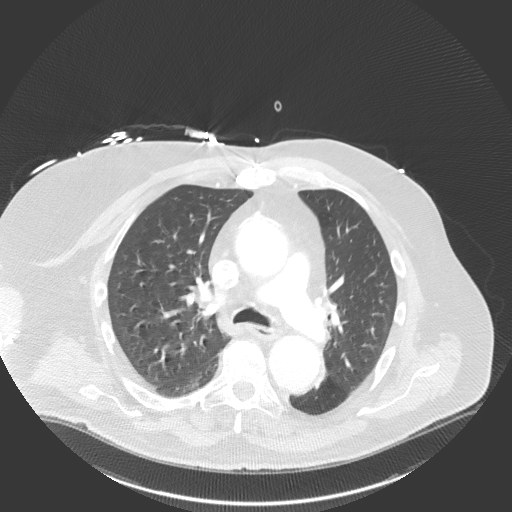
[im 318/371  lung]
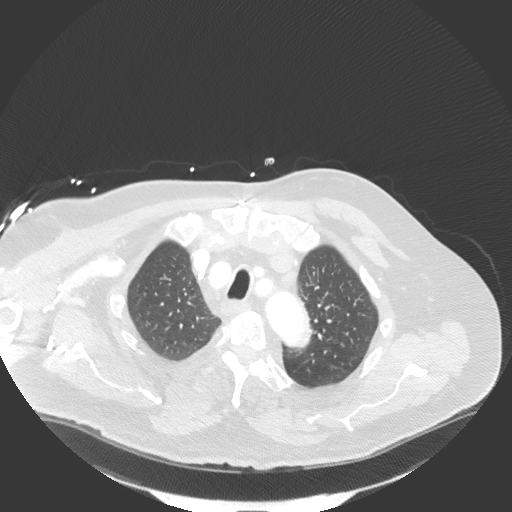
[im 344/371  mediastinal]
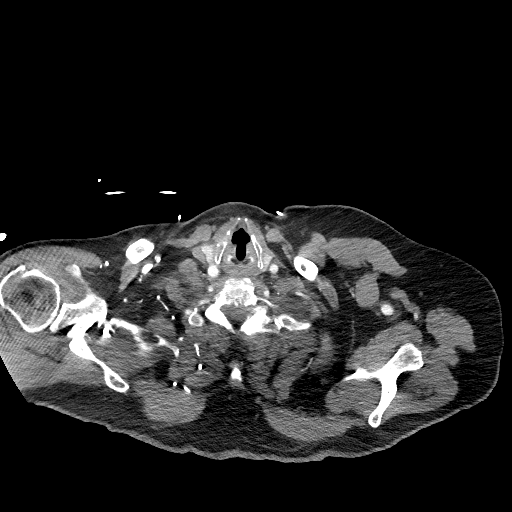
[im 344/371  lung]
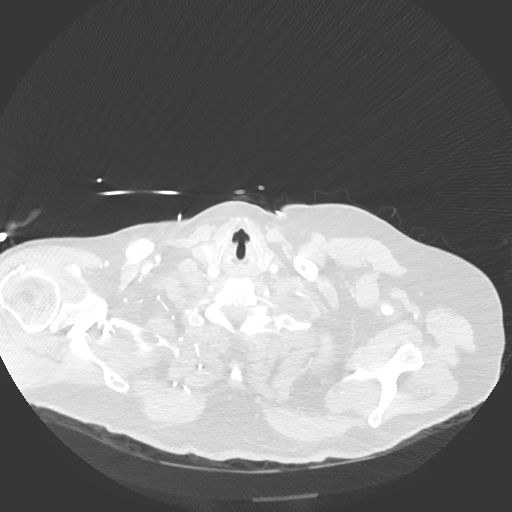

[Series 13: dissection 2mm cor · coronal · 0.95mm/px · 1 of 189 slices shown, 2 images]
[im 95/189  mediastinal]
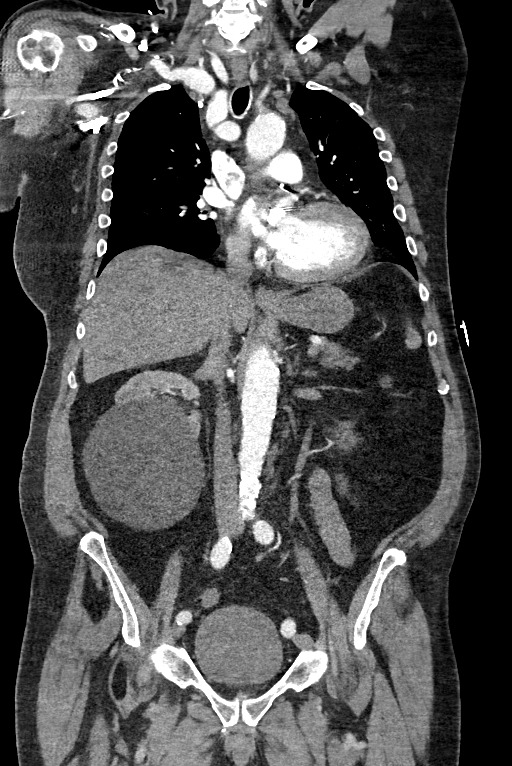
[im 95/189  bone]
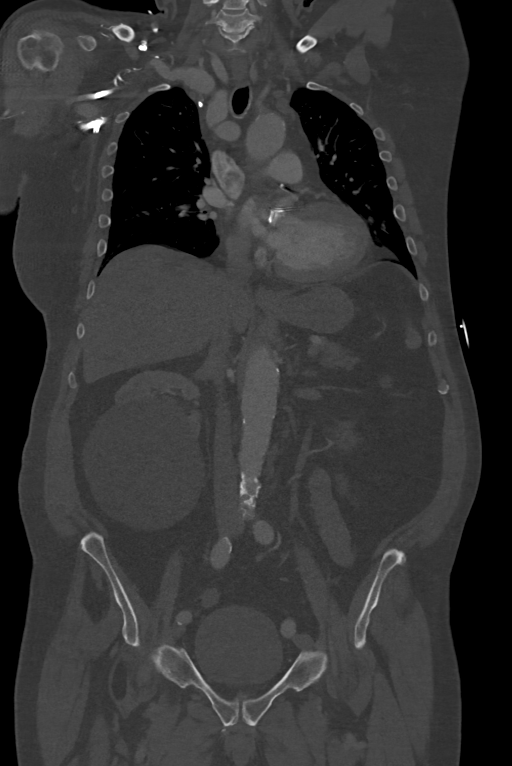

[11 of 36 positions shown; findings below may reference images not displayed]

Multidetector CT imaging through the chest, abdomen and pelvis was
performed using the standard protocol during bolus administration of
intravenous contrast. Multiplanar reconstructed images and MIPs were
obtained and reviewed to evaluate the vascular anatomy.

CONTRAST:  100mL OMNIPAQUE IOHEXOL 350 MG/ML SOLN
FINDINGS: CTA CHEST FINDINGS

Cardiovascular: Initial precontrast images demonstrate
atherosclerotic calcification of the thoracic aorta. Aortic valve
replacement is noted. No hyperdense crescent is identified to
suggest acute aortic injury.

There are changes consistent with ascending aortic repair in
addition to the aortic valve replacement. No dissection is seen.
Aneurysmal dilatation of the descending thoracic aorta is noted
measuring 4.4 cm. No significant tapering is noted distally.
Coronary calcifications are seen. Pulmonary artery shows no large
central embolus although timing was not performed for pulmonary
embolus evaluation.

Mediastinum/Nodes: Thoracic inlet is within normal limits. No hilar
or mediastinal adenopathy is noted. The esophagus as visualized
shows mild distal wall thickening similar to that seen on the prior
exam likely related to a degree of reflux.

Lungs/Pleura: Lungs are well aerated bilaterally. No focal
infiltrate or sizable effusion is noted. Mild emphysematous changes
are seen.

Musculoskeletal: Large right pectoral lipoma is seen. This is stable
from the prior exam. Degenerative changes of the thoracic spine are
noted. No compression deformities are seen.

Review of the MIP images confirms the above findings.

CTA ABDOMEN AND PELVIS FINDINGS

VASCULAR

Aorta: Abdominal aorta demonstrates atherosclerotic calcifications.
No evidence of aneurysmal dilatation is seen.

Celiac: Mild narrowing of the celiac axis is noted proximally likely
related to median arcuate compression. Minimal poststenotic
dilatation is seen.

SMA: Patent without evidence of aneurysm, dissection, vasculitis or
significant stenosis.

Renals: Single renal arteries are identified bilaterally.

IMA: Patent without evidence of aneurysm, dissection, vasculitis or
significant stenosis.

Iliacs: Mild atherosclerotic calcifications are noted. No aneurysmal
dilatation or dissection is noted.

Veins: No specific venous abnormality is seen.

Review of the MIP images confirms the above findings.

NON-VASCULAR

Hepatobiliary: No focal liver abnormality is seen. No gallstones,
gallbladder wall thickening, or biliary dilatation.

Pancreas: Unremarkable. No pancreatic ductal dilatation or
surrounding inflammatory changes.

Spleen: Normal in size without focal abnormality.

Adrenals/Urinary Tract: Adrenal glands are within normal limits.
Kidneys are well visualized bilaterally. No renal calculi or
obstructive changes are seen. Large right renal cyst is noted which
measures approximately 12 cm in greatest dimension. This is similar
to that seen on the prior exam. A small exophytic area of
calcification is noted along its superior margin laterally similar
to that noted on previous exams. No obstructive changes are noted.
The bladder is well distended. Changes consistent with urolift are
noted.

Stomach/Bowel: Colon shows scattered diverticular change without
evidence of diverticulitis. No obstructive or inflammatory changes
are noted. The appendix is within normal limits. No small bowel
abnormality is seen. No gastric abnormality is noted.

Lymphatic: No significant lymphadenopathy is noted.

Reproductive: Prostate is unremarkable with the exception of uro
lift device is superiorly.

Other: No abdominal wall hernia or abnormality. No abdominopelvic
ascites.

Musculoskeletal: No acute or significant osseous findings.

Review of the MIP images confirms the above findings.
IMPRESSION: No evidence of aortic dissection. There are changes consistent with
aortic valve repair and replacement of the ascending aorta. Stable
dilatation of the descending aorta is noted.

Changes suggestive of distal esophageal reflux.

Chronic changes in the abdomen and pelvis similar to that seen on
prior exams.

## 2020-03-20 IMAGING — CT CT HEAD W/O CM
4 series · 16 of 47 positions shown, 18 images · non-contrast
Comparison: None.

CLINICAL DATA: Dizziness, fall.

EXAM:
CT HEAD WITHOUT CONTRAST
CT CERVICAL SPINE WITHOUT CONTRAST
TECHNIQUE: Multidetector CT imaging of the head and cervical spine was
performed following the standard protocol without intravenous
contrast. Multiplanar CT image reconstructions of the cervical spine
were also generated.

[Series 4: head without · axial · non-contrast · 0.45mm/px · z∈[-162,-32]mm · 7 of 36 slices shown, 9 images]
[im 5/36  brain]
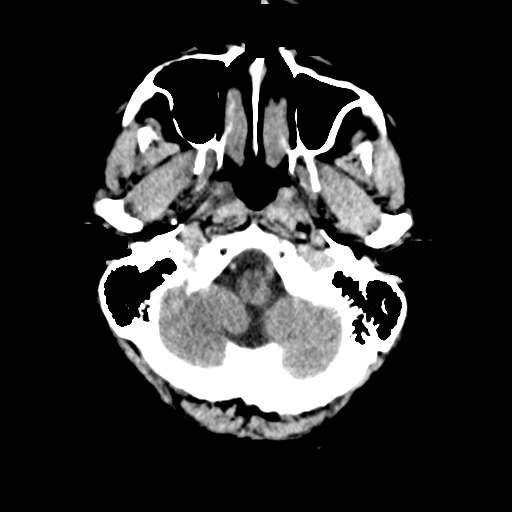
[im 5/36  bone]
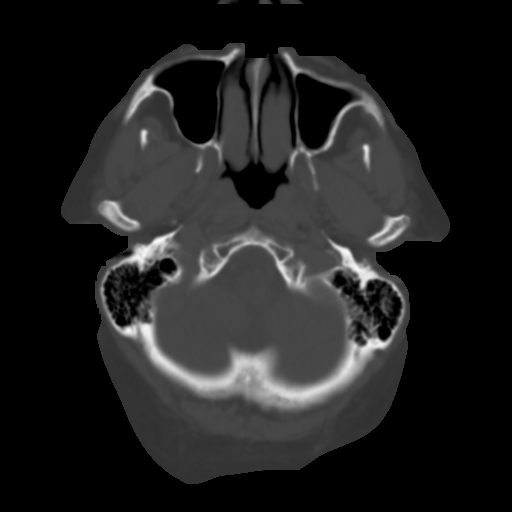
[im 9/36  brain]
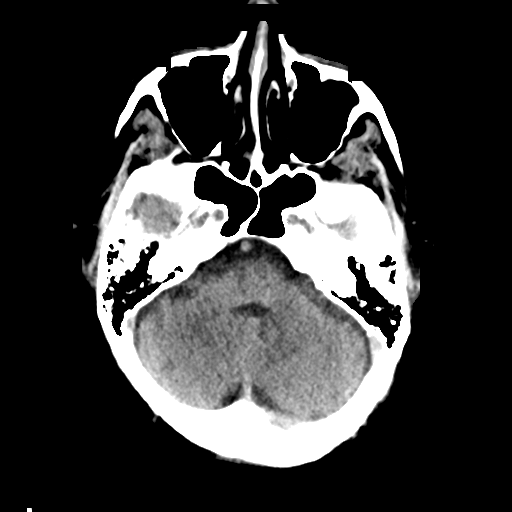
[im 14/36  brain]
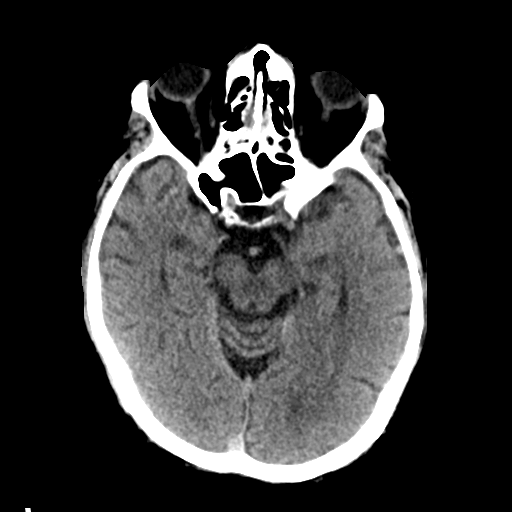
[im 18/36  brain]
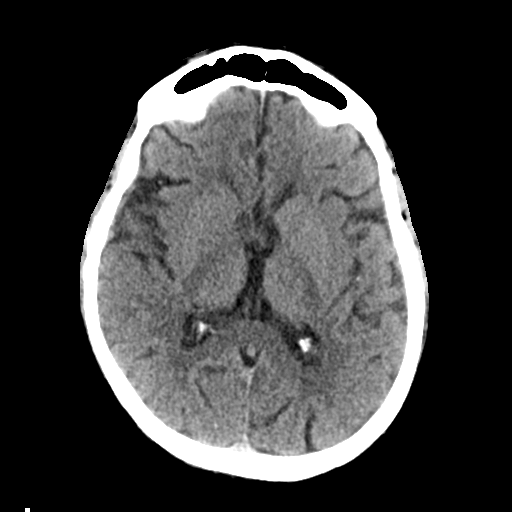
[im 22/36  brain]
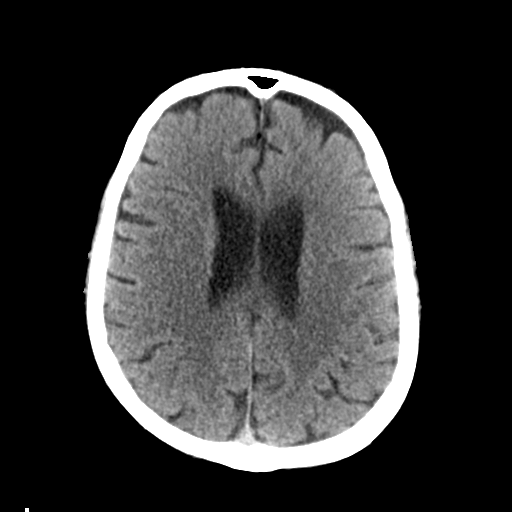
[im 22/36  bone]
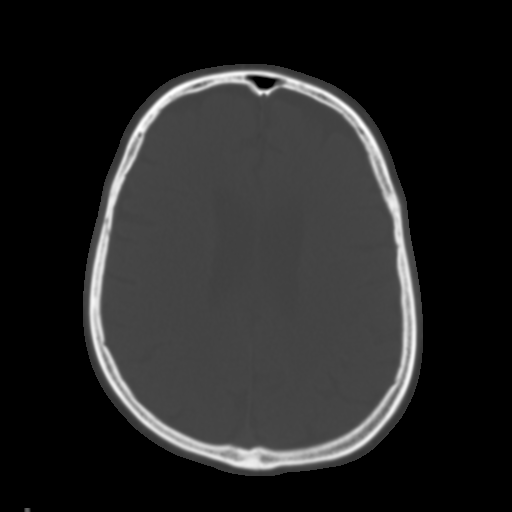
[im 27/36  brain]
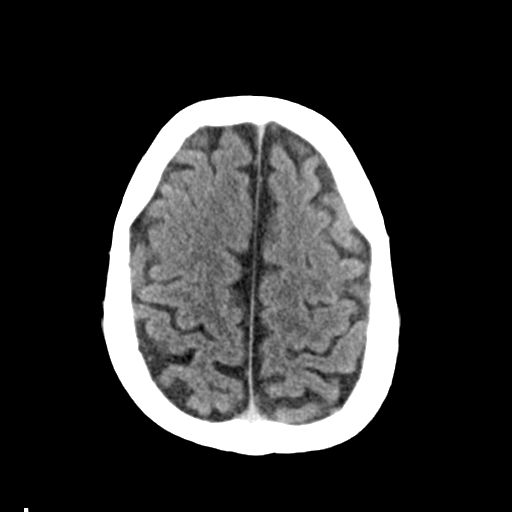
[im 31/36  brain]
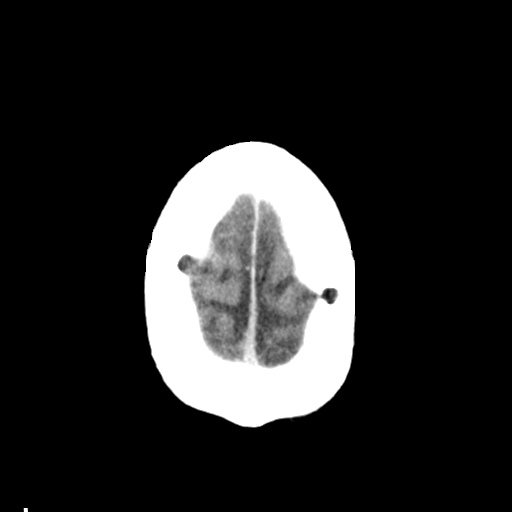

[Series 5: head bone · axial · 0.45mm/px · z∈[-166,-130]mm · 3 of 90 slices shown]
[im 9/90  bone]
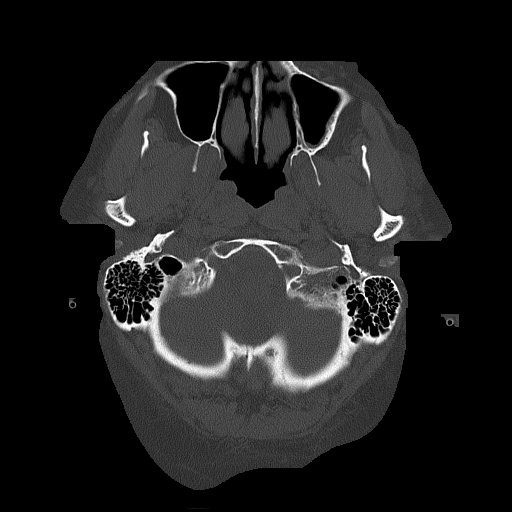
[im 18/90  bone]
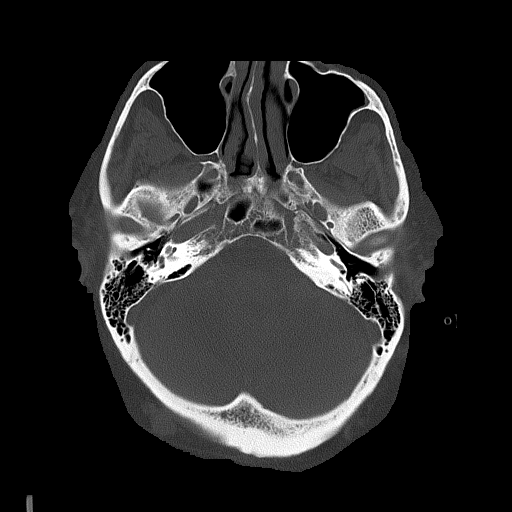
[im 27/90  bone]
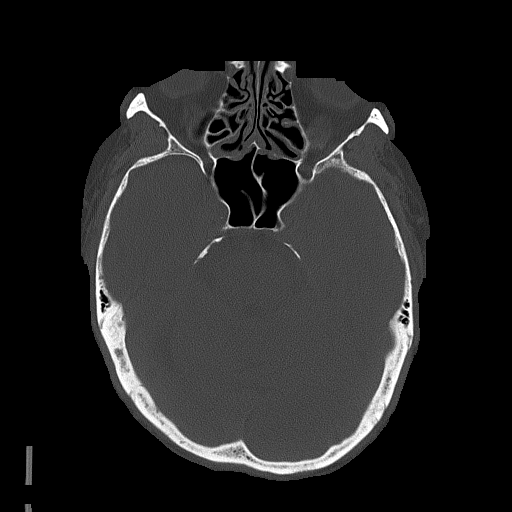

[Series 6: head without cor · coronal · non-contrast · 0.35mm/px · 3 of 78 slices shown]
[im 26/78  brain]
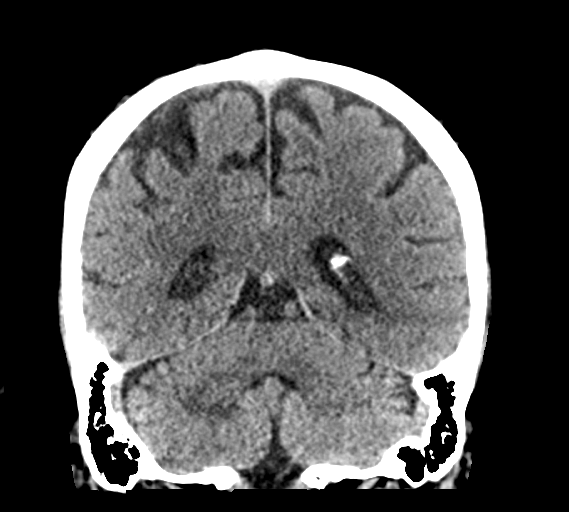
[im 35/78  brain]
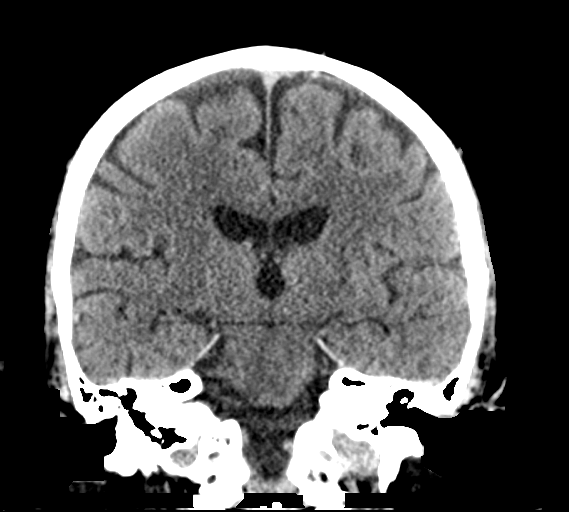
[im 43/78  brain]
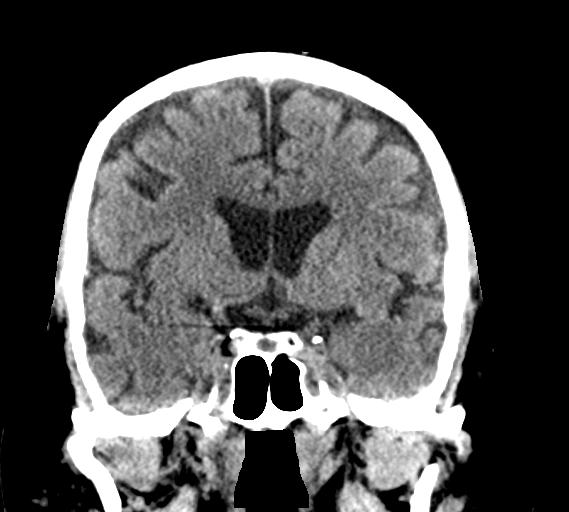

[Series 7: head without sag · sagittal · non-contrast · 0.39mm/px · 3 of 67 slices shown]
[im 23/67  brain]
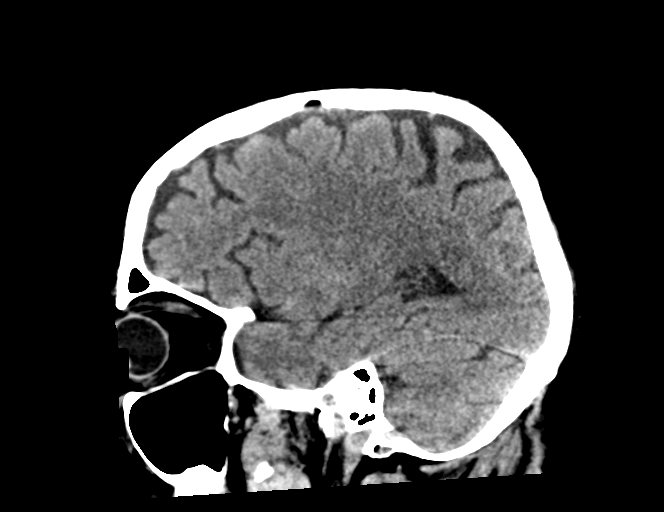
[im 34/67  brain]
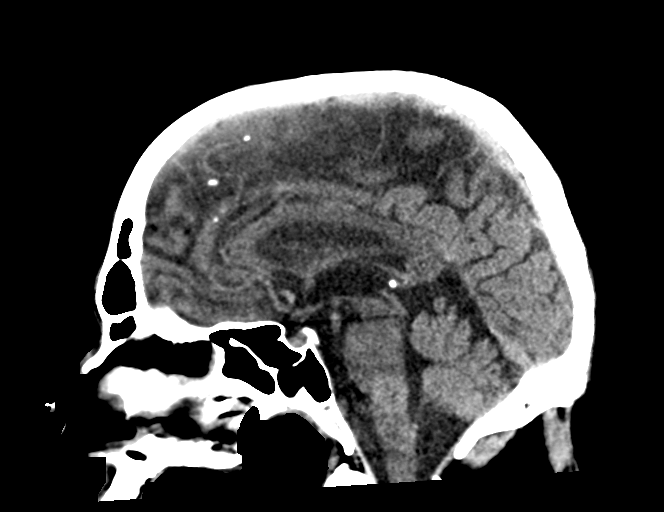
[im 45/67  brain]
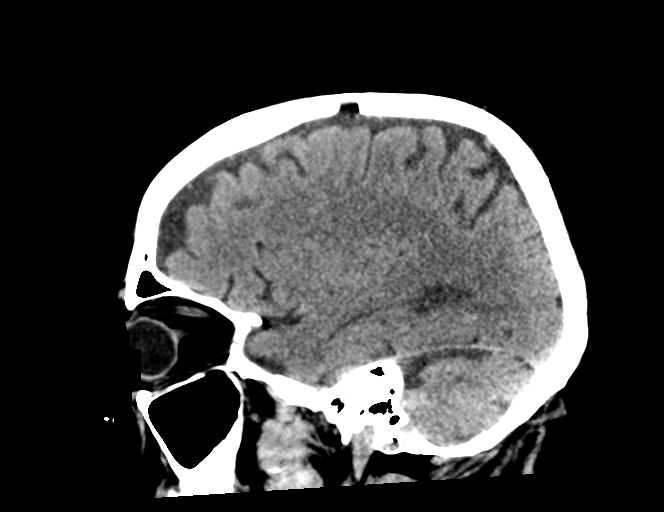

[16 of 47 positions shown; findings below may reference images not displayed]

FINDINGS: CT HEAD FINDINGS

Brain: No evidence of acute infarction, hemorrhage, hydrocephalus,
extra-axial collection or mass lesion/mass effect.

Vascular: No hyperdense vessel or unexpected calcification.

Skull: Normal. Negative for fracture or focal lesion.

Sinuses/Orbits: No acute finding.

Other: None.

CT CERVICAL SPINE FINDINGS

Alignment: Minimal grade 1 anterolisthesis of C4-5 is noted
secondary to posterior facet joint hypertrophy.

Skull base and vertebrae: No acute fracture. No primary bone lesion
or focal pathologic process.

Soft tissues and spinal canal: No prevertebral fluid or swelling. No
visible canal hematoma.

Disc levels: Severe degenerative disc disease is noted at C5-6 and
C6-7 with anterior posterior osteophyte formation.

Upper chest: Negative.

Other: None.
IMPRESSION: 1. Normal head CT.
2. Severe multilevel degenerative disc disease. No acute abnormality
seen in the cervical spine.

## 2020-03-20 IMAGING — CT CT CERVICAL SPINE W/O CM
4 series · 16 of 33 positions shown, 19 images · non-contrast
Comparison: None.

CLINICAL DATA: Dizziness, fall.

EXAM:
CT HEAD WITHOUT CONTRAST
CT CERVICAL SPINE WITHOUT CONTRAST
TECHNIQUE: Multidetector CT imaging of the head and cervical spine was
performed following the standard protocol without intravenous
contrast. Multiplanar CT image reconstructions of the cervical spine
were also generated.

[Series 1: c_spine 2.0 st · axial · 0.36mm/px · z∈[-307,-179]mm · 5 of 96 slices shown, 7 images]
[im 16/96  soft-tissue]
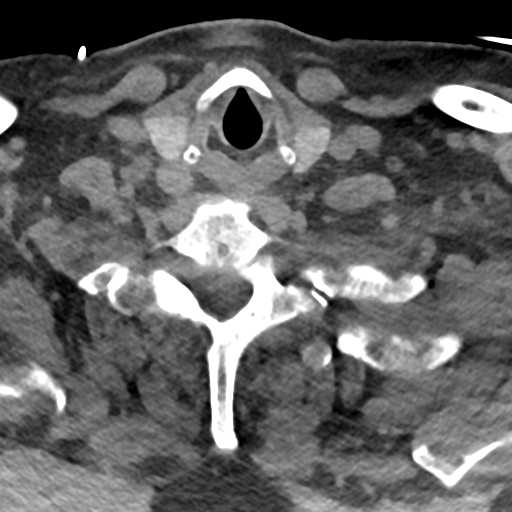
[im 16/96  bone]
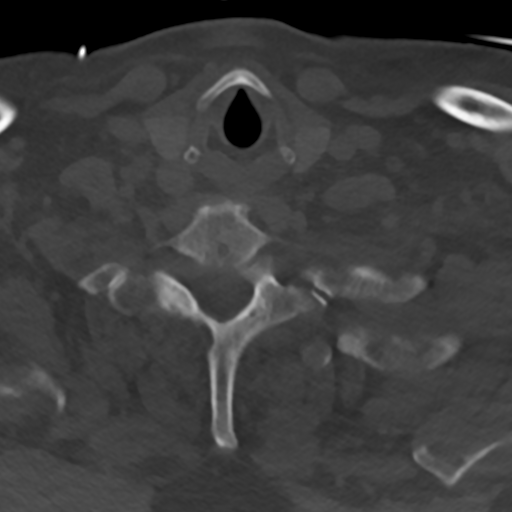
[im 32/96  bone]
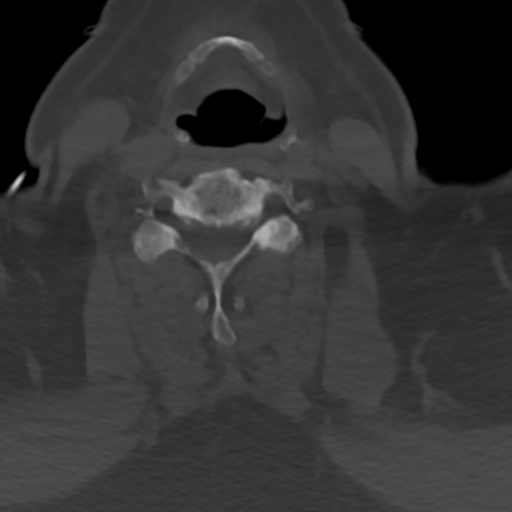
[im 48/96  bone]
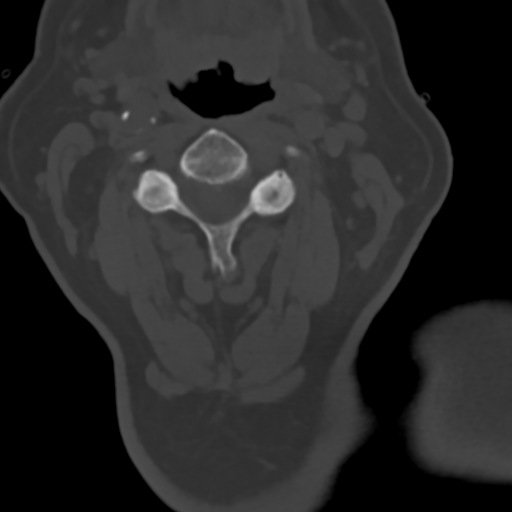
[im 64/96  bone]
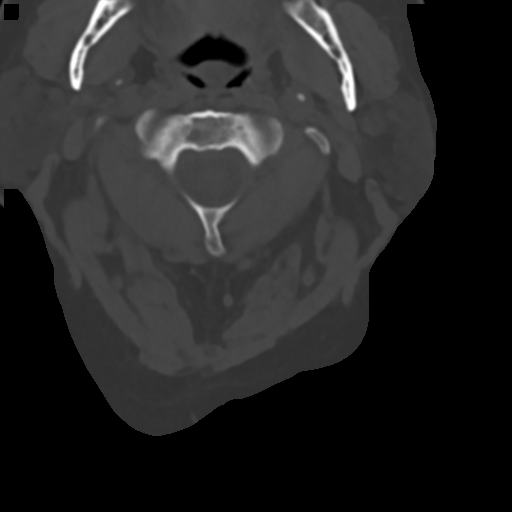
[im 80/96  soft-tissue]
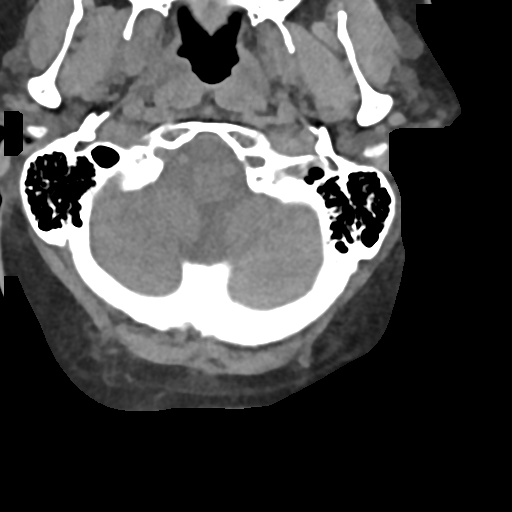
[im 80/96  bone]
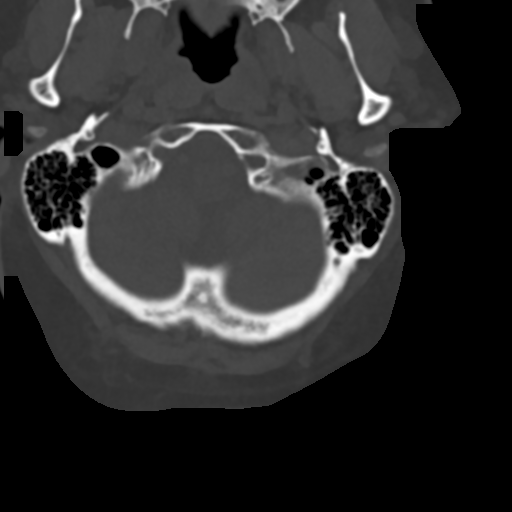

[Series 6: c_spine 2.0 sag bone · sagittal · 0.37mm/px · 5 of 84 slices shown, 6 images]
[im 28/84  bone]
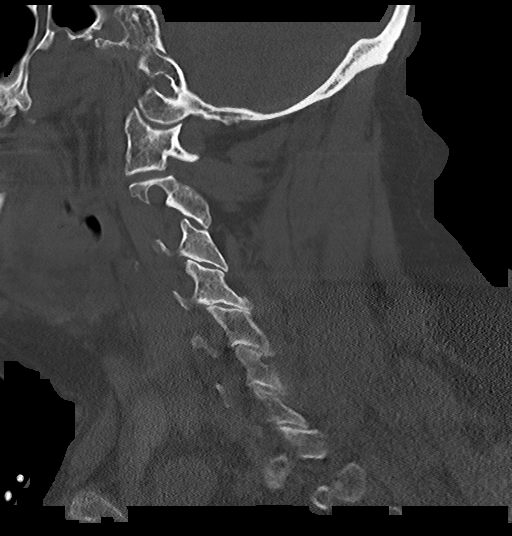
[im 35/84  bone]
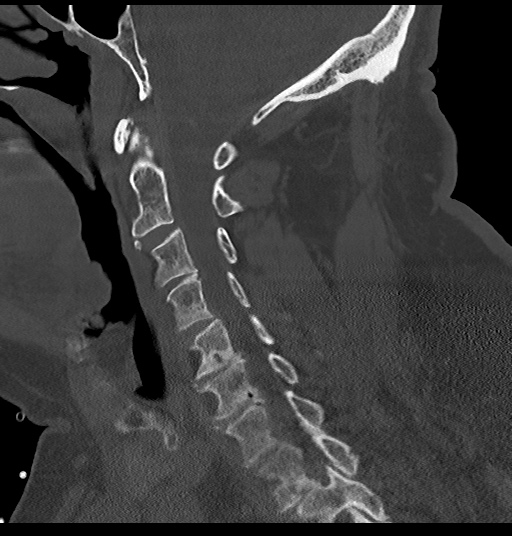
[im 42/84  soft-tissue]
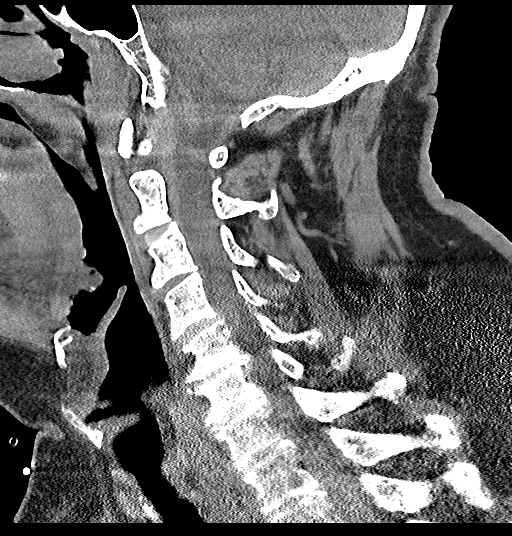
[im 42/84  bone]
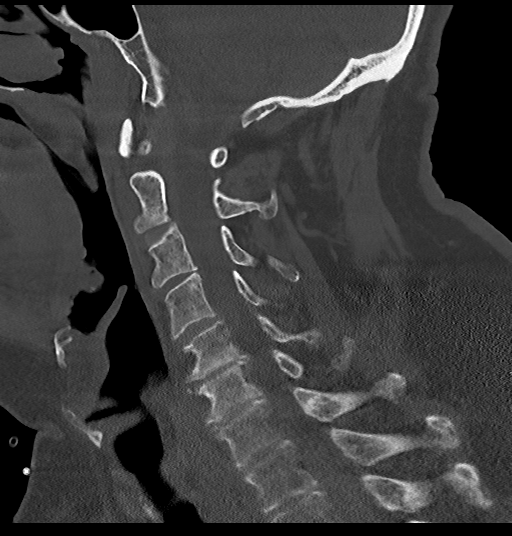
[im 49/84  bone]
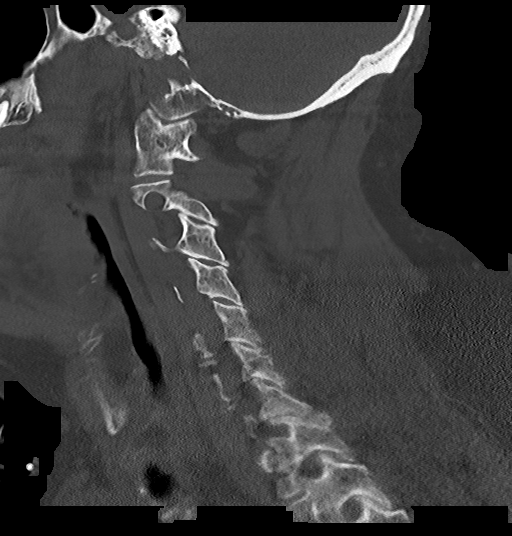
[im 56/84  bone]
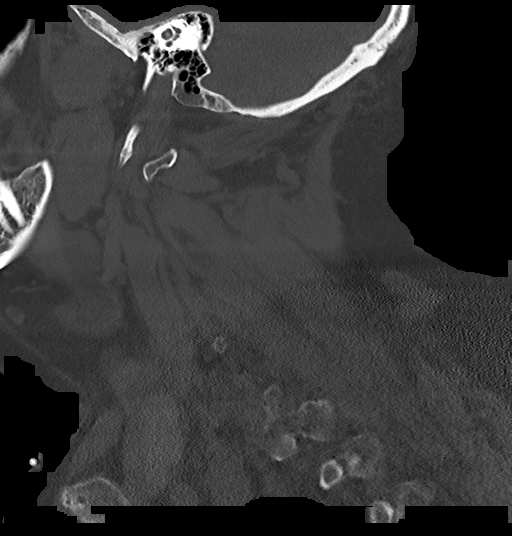

[Series 7: c_spine 2.0 cor bone · coronal · 0.40mm/px · 3 of 102 slices shown]
[im 21/102  bone]
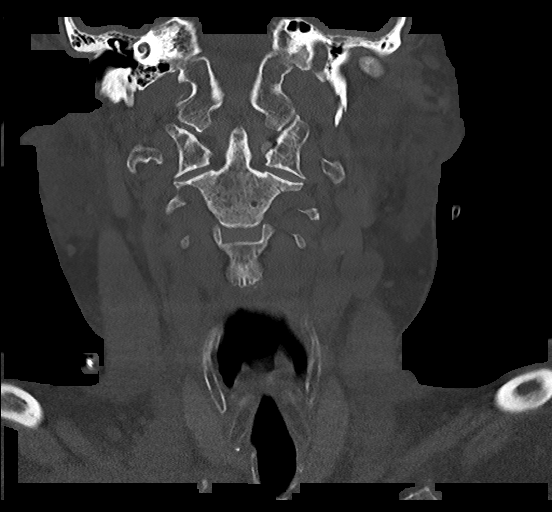
[im 41/102  bone]
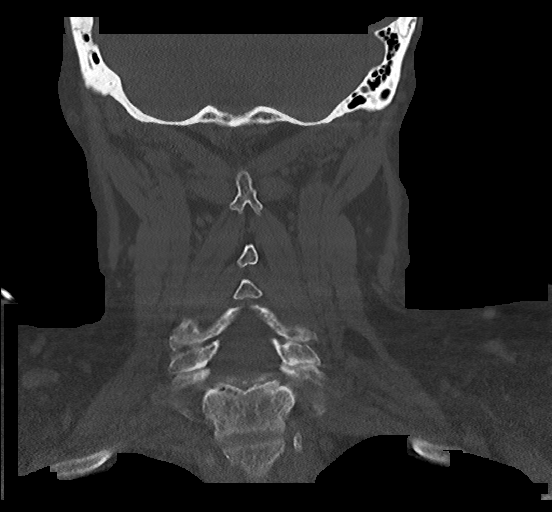
[im 61/102  bone]
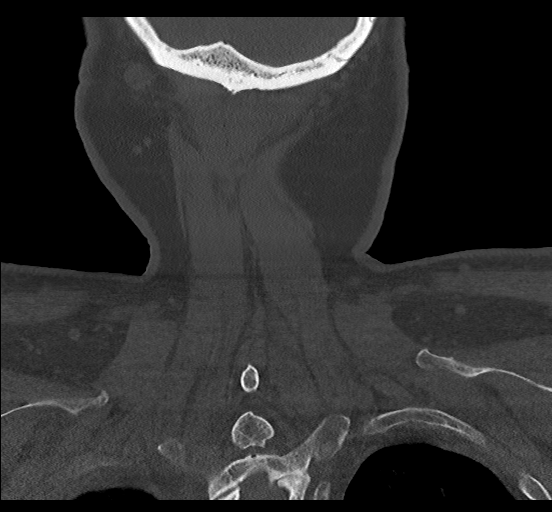

[Series 10: c_spine 2.0 orthogonals · axial · 0.28mm/px · z∈[-338,-282]mm · 3 of 86 slices shown]
[im 15/86  bone]
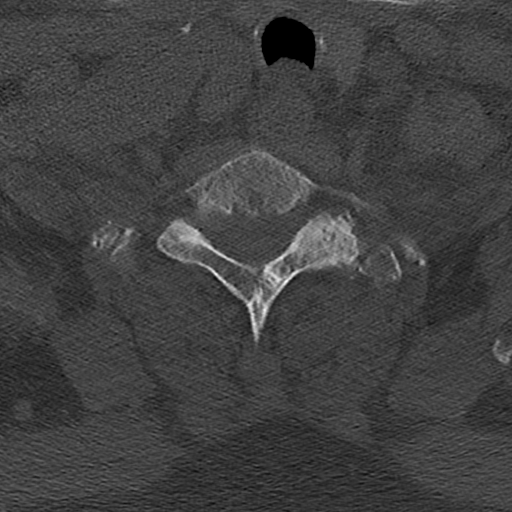
[im 29/86  bone]
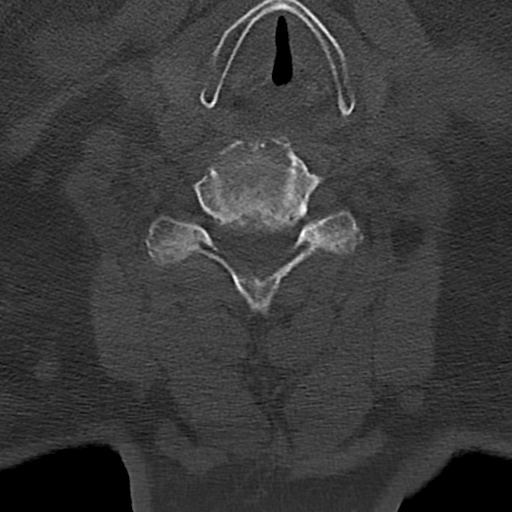
[im 43/86  bone]
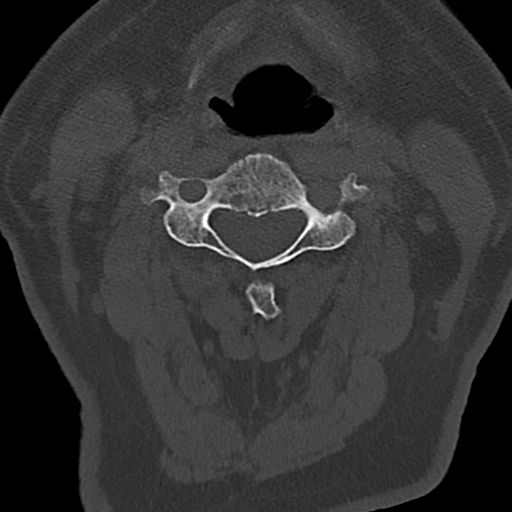

[16 of 33 positions shown; findings below may reference images not displayed]

FINDINGS: CT HEAD FINDINGS

Brain: No evidence of acute infarction, hemorrhage, hydrocephalus,
extra-axial collection or mass lesion/mass effect.

Vascular: No hyperdense vessel or unexpected calcification.

Skull: Normal. Negative for fracture or focal lesion.

Sinuses/Orbits: No acute finding.

Other: None.

CT CERVICAL SPINE FINDINGS

Alignment: Minimal grade 1 anterolisthesis of C4-5 is noted
secondary to posterior facet joint hypertrophy.

Skull base and vertebrae: No acute fracture. No primary bone lesion
or focal pathologic process.

Soft tissues and spinal canal: No prevertebral fluid or swelling. No
visible canal hematoma.

Disc levels: Severe degenerative disc disease is noted at C5-6 and
C6-7 with anterior posterior osteophyte formation.

Upper chest: Negative.

Other: None.
IMPRESSION: 1. Normal head CT.
2. Severe multilevel degenerative disc disease. No acute abnormality
seen in the cervical spine.

## 2020-03-20 MED ORDER — ACETAMINOPHEN 650 MG RE SUPP: 650.0000 mg | Freq: Four times a day (QID) | RECTAL | Status: DC | PRN

## 2020-03-20 MED ORDER — IOHEXOL 350 MG/ML SOLN
100.0000 mL | Freq: Once | INTRAVENOUS | Status: AC | PRN
  Administered 2020-03-20: 100 mL via INTRAVENOUS

## 2020-03-20 MED ORDER — FENTANYL CITRATE (PF) 100 MCG/2ML IJ SOLN
INTRAMUSCULAR | Status: AC
  Administered 2020-03-20: 50 ug
  Filled 2020-03-20: qty 2

## 2020-03-20 MED ORDER — SPIRONOLACTONE 12.5 MG HALF TABLET
12.5000 mg | ORAL_TABLET | Freq: Every day | ORAL | Status: DC
  Administered 2020-03-21: 12.5 mg via ORAL
  Filled 2020-03-20 (×2): qty 1

## 2020-03-20 MED ORDER — ONDANSETRON HCL 4 MG/2ML IJ SOLN: 4.0000 mg | Freq: Once | INTRAMUSCULAR | Status: DC

## 2020-03-20 MED ORDER — METFORMIN HCL ER 500 MG PO TB24
1000.0000 mg | ORAL_TABLET | Freq: Every day | ORAL | Status: DC
  Filled 2020-03-20: qty 2

## 2020-03-20 MED ORDER — SALONPAS PAIN RELIEF PATCH EX PTCH: MEDICATED_PATCH | Freq: Every day | CUTANEOUS | Status: DC | PRN

## 2020-03-20 MED ORDER — EMPAGLIFLOZIN 25 MG PO TABS
25.0000 mg | ORAL_TABLET | Freq: Every day | ORAL | Status: DC
  Administered 2020-03-21 – 2020-03-22 (×2): 25 mg via ORAL
  Filled 2020-03-20 (×3): qty 1

## 2020-03-20 MED ORDER — RIVAROXABAN 20 MG PO TABS
20.0000 mg | ORAL_TABLET | Freq: Every day | ORAL | Status: DC
  Administered 2020-03-21 (×2): 20 mg via ORAL
  Filled 2020-03-20 (×3): qty 1

## 2020-03-20 MED ORDER — OXYCODONE-ACETAMINOPHEN 5-325 MG PO TABS
1.0000 | ORAL_TABLET | Freq: Once | ORAL | Status: AC
  Administered 2020-03-20: 1 via ORAL
  Filled 2020-03-20: qty 1

## 2020-03-20 MED ORDER — SODIUM CHLORIDE 0.9% FLUSH
3.0000 mL | Freq: Two times a day (BID) | INTRAVENOUS | Status: DC
  Administered 2020-03-21 – 2020-03-22 (×4): 3 mL via INTRAVENOUS

## 2020-03-20 MED ORDER — EMPAGLIFLOZIN-METFORMIN HCL ER 25-1000 MG PO TB24: 1.0000 | ORAL_TABLET | Freq: Every day | ORAL | Status: DC

## 2020-03-20 MED ORDER — POLYETHYLENE GLYCOL 3350 17 G PO PACK: 17.0000 g | PACK | Freq: Every day | ORAL | Status: DC | PRN

## 2020-03-20 MED ORDER — METOPROLOL TARTRATE 12.5 MG HALF TABLET
12.5000 mg | ORAL_TABLET | Freq: Two times a day (BID) | ORAL | Status: DC
  Administered 2020-03-21 – 2020-03-22 (×4): 12.5 mg via ORAL
  Filled 2020-03-20 (×4): qty 1

## 2020-03-20 MED ORDER — OXYCODONE-ACETAMINOPHEN 5-325 MG PO TABS
1.0000 | ORAL_TABLET | Freq: Three times a day (TID) | ORAL | Status: AC | PRN
  Administered 2020-03-20 – 2020-03-21 (×2): 1 via ORAL
  Filled 2020-03-20 (×3): qty 1

## 2020-03-20 MED ORDER — ALFUZOSIN HCL ER 10 MG PO TB24
10.0000 mg | ORAL_TABLET | Freq: Every day | ORAL | Status: DC
  Administered 2020-03-21 – 2020-03-22 (×2): 10 mg via ORAL
  Filled 2020-03-20 (×3): qty 1

## 2020-03-20 MED ORDER — LOSARTAN POTASSIUM 50 MG PO TABS
50.0000 mg | ORAL_TABLET | Freq: Two times a day (BID) | ORAL | Status: DC
  Administered 2020-03-21: 50 mg via ORAL
  Filled 2020-03-20: qty 1

## 2020-03-20 MED ORDER — ACETAMINOPHEN 325 MG PO TABS: 650.0000 mg | ORAL_TABLET | Freq: Four times a day (QID) | ORAL | Status: DC | PRN

## 2020-03-20 MED ORDER — ATORVASTATIN CALCIUM 10 MG PO TABS
20.0000 mg | ORAL_TABLET | Freq: Every day | ORAL | Status: DC
  Administered 2020-03-21 (×2): 20 mg via ORAL
  Filled 2020-03-20 (×2): qty 2

## 2020-03-20 NOTE — H&P (Signed)
History and Physical   Clinton Black:[id number] DOB: 08-25-1945 DOA: 03/20/2020  PCP: [name], MD   Patient coming from: Home via EMS  Chief Complaint: Syncope  HPI: Clinton Black is a 74 y.o. male with medical history significant of thoracic aortic aneurysm status post AVR with bioprosthetic valve, aortic graft, and modified MA ZE with bilateral pulmonary vein isolation in May 2021; A. fib status post ablation with recurrence; diabetes; hypertension; hyperlipidemia; and BPH who presented following an episode of syncope and collapse.  He states he has had a degree of lightheadedness since his aortic procedure in May.  He has had intermittent symptoms since that time with an episode of syncope this past Thursday.  During that episode he was able to make it to his bed and did not hit his head.  He was taken to the ED at that time given a liter of fluids and was asymptomatic he was discharged with close follow-up with cardiology.  Cardiology suspected an episode of A. fib with RVR leading to his syncopal event and have ordered a 30-day monitor which she has not received yet.  Today, patient experienced another episode of syncope which was preceded by similar lightheadedness and clammy sensation.  He fell and did hit his head.  No significant acute changes on imaging in ED.  He denies chest pain shortness of breath, abdominal pain, fever, nausea, diarrhea, sick contacts  ED Course: In the ED vital signs were stable, lab work significant for normal CMP, CBC, troponin.  Lactic acid insignificantly elevated to 2.1, EtOH negative, PT INR mildly elevated but likely due to Xarelto use.  As above, imaging negative for acute injury including CT head cervical spine and thoracic spine.  He also had a CT angio chest abdomen pelvis to evaluate for dissection given his history which was negative.  Patient to be admitted for observation.  Review of Systems: As per HPI otherwise all other systems reviewed  and are negative.  Past Medical History:  Diagnosis Date  . Arthritis   . Atrial fibrillation ([place])   . BPH (benign prostatic hyperplasia)   . Diabetes mellitus type 2 in obese (HCC)    diet controlled  . HTN (hypertension)   . Hyperlipidemia, mixed   . Insomnia   . Obesity   . Thoracic aortic aneurysm, without rupture (HCC)    4.7 cm per chest ct with contrast 11-04-17    Past Surgical History:  Procedure Laterality Date  . AORTIC VALVE REPLACEMENT N/A 10/16/2019   Procedure: AORTIC VALVE REPLACEMENT (AVR) using Edwards PERIMOUNT Magna Ease 25MM Bioprosthesis Aortic Valve.;  Surgeon: [name], MD;  Location: [place];  Service: Open Heart Surgery;  Laterality: N/A;  Right subclavian artery cannulation.  . AORTIC VALVE REPLACEMENT  10/16/2019   AORTIC VALVE REPLACEMENT (AVR)   . ATRIAL FIBRILLATION ABLATION N/A 04/07/2019   Procedure: ATRIAL FIBRILLATION ABLATION;  Surgeon: [name], MD;  Location: [place] [facility];  Service: Cardiovascular;  Laterality: N/A;  . CARDIOVERSION N/A 07/12/2018   Procedure: CARDIOVERSION;  Surgeon: Thayer Headings, MD;  Location: [facility];  Service: Cardiovascular;  Laterality: N/A;  . CARDIOVERSION N/A 08/22/2018   Procedure: CARDIOVERSION;  Surgeon: Skeet Latch, MD;  Location: [place];  Service: Cardiovascular;  Laterality: N/A;  . CARDIOVERSION N/A 06/28/2019   Procedure: CARDIOVERSION;  Surgeon: Acie Fredrickson Wonda Cheng, MD;  Location: [place];  Service: Cardiovascular;  Laterality: N/A;  . CLIPPING OF ATRIAL APPENDAGE N/A 10/16/2019  Procedure: Clipping Of Atrial Appendage using AtriCure AtriClip Exclusion VLAA 52mm.;  Surgeon: [name], MD;  Location: [place];  Service: Open Heart Surgery;  Laterality: N/A;  . COLONOSCOPY  2016  . CYST EXCISION     polynomial  . CYSTOSCOPY WITH INSERTION OF UROLIFT N/A 04/18/2018   Procedure: CYSTOSCOPY WITH INSERTION OF UROLIFT;  Surgeon: Cleon Gustin, MD;  Location:  [facility];  Service: Urology;  Laterality: N/A;  . KNEE SURGERY Right 2000   arthroscopy  . MAZE N/A 10/16/2019   Procedure: MAZE with bilateral pulmonary vein isolation.;  Surgeon: [name], MD;  Location: [place];  Service: Open Heart Surgery;  Laterality: N/A;  Maze procedure using Atricure OLL2 Isolator clamp.  . REPLACEMENT ASCENDING AORTA N/A 10/16/2019   Procedure: SUPRA CORONARY REPLACEMENT OF ASCENDING AORTA using Maquet Hemashiel Platinum 34 MM Vascular Graft.;  Surgeon: [name], MD;  Location: [place];  Service: Open Heart Surgery;  Laterality: N/A;  Right subclavian artery cannulation.  Marland Kitchen RIGHT/LEFT HEART CATH AND CORONARY ANGIOGRAPHY N/A 01/30/2019   Procedure: RIGHT/LEFT HEART CATH AND CORONARY ANGIOGRAPHY;  Surgeon: [name], [name], MD;  Location: [place] [facility];  Service: Cardiovascular;  Laterality: N/A;  . TEE WITHOUT CARDIOVERSION N/A 10/16/2019   Procedure: TRANSESOPHAGEAL ECHOCARDIOGRAM (TEE);  Surgeon: [name], MD;  Location: [place];  Service: Open Heart Surgery;  Laterality: N/A;  . TONSILLECTOMY    . widson teeth extraction      Social History  reports that he quit smoking about 25 years ago. His smoking use included cigarettes. He has a 7.50 pack-year smoking history. He has never used smokeless tobacco. He reports previous alcohol use of about 14.0 standard drinks of alcohol per week. He reports current drug use. Drug: Marijuana.  Allergies  Allergen Reactions  . Quinolones Other (See Comments)    Patient was warned about not using Cipro and similar antibiotics. Recent studies have raised concern that fluoroquinolone antibiotics could be associated with an increased risk of aortic aneurysm Fluoroquinolones have non-antimicrobial properties that might jeopardise the integrity of the extracellular matrix of the vascular wall In a  propensity score matched cohort study in [place], there was a 66% increased rate of aortic aneurysm  or dissection associated with oral fluoroquinolone use, compared wit   Family History  Problem Relation Age of Onset  . Aneurysm Father   . Heart disease Father   . Varicose Veins Mother   . Arthritis Mother   . Macular degeneration Mother   . Cancer Neg Hx   . Colon cancer Neg Hx   . Esophageal cancer Neg Hx   . Rectal cancer Neg Hx   . Stomach cancer Neg Hx   Reviewed on Admission  Prior to Admission medications   Medication Sig Start Date End Date Taking? Authorizing Provider  alfuzosin (UROXATRAL) 10 MG 24 hr tablet Take 1 tablet (10 mg total) by mouth daily with breakfast. 01/18/20  Yes [name], MD  atorvastatin (LIPITOR) 20 MG tablet TAKE 1 TABLET BY MOUTH EVERYDAY AT BEDTIME Patient taking differently: Take 20 mg by mouth at bedtime.  03/11/20  Yes [name], MD  Empagliflozin-metFORMIN HCl ER (SYNJARDY XR) 25-1000 MG TB24 Take 1 tablet by mouth daily. 09/06/19  Yes [name], MD  losartan (COZAAR) 25 MG tablet Take 50 mg by mouth 2 (two) times daily.  03/06/20  Yes [provider]  Menthol-Methyl Salicylate (SALONPAS PAIN RELIEF PATCH EX) Apply  1 patch topically daily as needed (back pain).    Yes [provider]  metoprolol tartrate (LOPRESSOR) 25 MG tablet Take 0.5 tablets (12.5 mg total) by mouth 2 (two) times daily. 03/11/20  Yes [name], MD  spironolactone (ALDACTONE) 25 MG tablet Take 0.5 tablets (12.5 mg total) by mouth at bedtime. 03/11/20  Yes [name], MD  triazolam (HALCION) 0.25 MG tablet TAKE 1 TABLET (0.25 MG TOTAL) BY MOUTH AT BEDTIME AS NEEDED FOR SLEEP. 01/29/20  Yes [name], MD  XARELTO 20 MG TABS tablet TAKE 1 TABLET EVERY DAY WITH SUPPER Patient taking differently: Take 20 mg by mouth daily with supper.  02/19/20  Yes [name], MD  alfuzosin (UROXATRAL) 10 MG 24 hr tablet Take 1 tablet (10 mg total) by mouth daily with breakfast. 05/29/19   [name], MD  atorvastatin (LIPITOR) 20 MG tablet Take  1 tablet (20 mg total) by mouth daily. 01/18/20   [name], MD  rivaroxaban (XARELTO) 20 MG TABS tablet Take 1 tablet (20 mg total) by mouth daily with supper. 01/31/19   [name], [name], MD  XARELTO 20 MG TABS tablet TAKE 1 TABLET (20 MG TOTAL) BY MOUTH DAILY WITH SUPPER. Patient taking differently: Take 20 mg by mouth daily with supper.  09/08/19   [name], MD   Physical Exam: Vitals:   03/20/20 1515 03/20/20 1745 03/20/20 1930 03/20/20 2015  BP: 106/79 131/81 130/90 126/90  Pulse: 69 84 81 83  Resp: 13 13 19 16   SpO2: 100% 98% 96% 98%    Constitutional: NAD, calm, comfortable Vitals:   03/20/20 1515 03/20/20 1745 03/20/20 1930 03/20/20 2015  BP: 106/79 131/81 130/90 126/90  Pulse: 69 84 81 83  Resp: 13 13 19 16   SpO2: 100% 98% 96% 98%   Physical Exam Constitutional:      General: He is not in acute distress.    Appearance: Normal appearance.  HENT:     Mouth/Throat:     Mouth: Mucous membranes are moist.     Pharynx: Oropharynx is clear.  Eyes:     Extraocular Movements: Extraocular movements intact.     Pupils: Pupils are equal, round, and reactive to light.  Cardiovascular:     Rate and Rhythm: Normal rate and regular rhythm.     Pulses: Normal pulses.     Heart sounds: Normal heart sounds.  Pulmonary:     Effort: Pulmonary effort is normal. No respiratory distress.     Breath sounds: Normal breath sounds.  Abdominal:     General: Bowel sounds are normal. There is no distension.     Palpations: Abdomen is soft.     Tenderness: There is no abdominal tenderness.  Musculoskeletal:        General: No swelling or deformity.  Skin:    General: Skin is warm and dry.  Neurological:     General: No focal deficit present.     Mental Status: He is alert. Mental status is at baseline.    Labs on Admission: I have personally reviewed following labs and imaging studies.   CBC: Recent Labs  Lab 03-21-2020 0207 03/20/20 1418 03/20/20 1436  WBC 9.2 8.8  --    NEUTROABS  --  6.4  --   HGB 14.0 14.1 14.3  HCT 44.6 43.3 42.0  MCV 87.8 86.4  --   PLT 199 199  --     Basic Metabolic Panel: Recent Labs  Lab  03/14/20 0207 03/20/20 1418 03/20/20 1436  NA 141 140 141  K 4.3 4.2 4.1  CL 110 107 108  CO2 20* 21*  --   GLUCOSE 117* 120* 114*  BUN 18 15 18   CREATININE 1.20 1.01 1.00  CALCIUM 9.5 8.9  --     GFR: Estimated Creatinine Clearance: 99.3 mL/min (by C-G formula based on SCr of 1 mg/dL).  Liver Function Tests: Recent Labs  Lab 03/20/20 1418  AST 17  ALT 17  ALKPHOS 84  BILITOT 0.9  PROT 6.2*  ALBUMIN 4.0    Urine analysis:    Component Value Date/Time   COLORURINE YELLOW 03/20/2020 1933   APPEARANCEUR CLEAR 03/20/2020 1933   LABSPEC 1.044 (H) 03/20/2020 1933   PHURINE 5.0 03/20/2020 1933   GLUCOSEU >=500 (A) 03/20/2020 1933   GLUCOSEU >=1000 (A) 05/29/2019 1430   HGBUR NEGATIVE 03/20/2020 1933   BILIRUBINUR NEGATIVE 03/20/2020 1933   BILIRUBINUR neg 02/07/2015 1526   KETONESUR 20 (A) 03/20/2020 1933   PROTEINUR NEGATIVE 03/20/2020 1933   UROBILINOGEN 0.2 05/29/2019 1430   NITRITE NEGATIVE 03/20/2020 1933   LEUKOCYTESUR NEGATIVE 03/20/2020 1933    Radiological Exams on Admission: CT Head Wo Contrast  Result Date: 03/20/2020 CLINICAL DATA:  Dizziness, fall. EXAM: CT HEAD WITHOUT CONTRAST CT CERVICAL SPINE WITHOUT CONTRAST TECHNIQUE: Multidetector CT imaging of the head and cervical spine was performed following the standard protocol without intravenous contrast. Multiplanar CT image reconstructions of the cervical spine were also generated. COMPARISON:  None. FINDINGS: CT HEAD FINDINGS Brain: No evidence of acute infarction, hemorrhage, hydrocephalus, extra-axial collection or mass lesion/mass effect. Vascular: No hyperdense vessel or unexpected calcification. Skull: Normal. Negative for fracture or focal lesion. Sinuses/Orbits: No acute finding. Other: None. CT CERVICAL SPINE FINDINGS Alignment: Minimal grade 1  anterolisthesis of C4-5 is noted secondary to posterior facet joint hypertrophy. Skull base and vertebrae: No acute fracture. No primary bone lesion or focal pathologic process. Soft tissues and spinal canal: No prevertebral fluid or swelling. No visible canal hematoma. Disc levels: Severe degenerative disc disease is noted at C5-6 and C6-7 with anterior posterior osteophyte formation. Upper chest: Negative. Other: None. IMPRESSION: 1. Normal head CT. 2. Severe multilevel degenerative disc disease. No acute abnormality seen in the cervical spine. Electronically Signed   By: Marijo Conception M.D.   On: 03/20/2020 16:47   CT Cervical Spine Wo Contrast  Result Date: 03/20/2020 CLINICAL DATA:  Dizziness, fall. EXAM: CT HEAD WITHOUT CONTRAST CT CERVICAL SPINE WITHOUT CONTRAST TECHNIQUE: Multidetector CT imaging of the head and cervical spine was performed following the standard protocol without intravenous contrast. Multiplanar CT image reconstructions of the cervical spine were also generated. COMPARISON:  None. FINDINGS: CT HEAD FINDINGS Brain: No evidence of acute infarction, hemorrhage, hydrocephalus, extra-axial collection or mass lesion/mass effect. Vascular: No hyperdense vessel or unexpected calcification. Skull: Normal. Negative for fracture or focal lesion. Sinuses/Orbits: No acute finding. Other: None. CT CERVICAL SPINE FINDINGS Alignment: Minimal grade 1 anterolisthesis of C4-5 is noted secondary to posterior facet joint hypertrophy. Skull base and vertebrae: No acute fracture. No primary bone lesion or focal pathologic process. Soft tissues and spinal canal: No prevertebral fluid or swelling. No visible canal hematoma. Disc levels: Severe degenerative disc disease is noted at C5-6 and C6-7 with anterior posterior osteophyte formation. Upper chest: Negative. Other: None. IMPRESSION: 1. Normal head CT. 2. Severe multilevel degenerative disc disease. No acute abnormality seen in the cervical spine.  Electronically Signed   By: Bobbe Medico.D.  On: 03/20/2020 16:47   CT T-SPINE NO CHARGE  Result Date: 03/20/2020 CLINICAL DATA:  Single episode with fall, back pain EXAM: CT Thoracic Spine with contrast TECHNIQUE: Multiplanar CT images of the thoracic spine were reconstructed from contemporary CT of the Chest. CONTRAST:  No additional COMPARISON:  None FINDINGS: Alignment: Anteroposterior alignment is maintained. Vertebrae: Vertebral body heights are maintained apart from degenerative endplate irregularity. No acute fracture. Paraspinal and other soft tissues: Unremarkable. Extra-spinal findings are better evaluated on concurrent dedicated imaging. Disc levels: Multilevel disc space narrowing, small endplate osteophytes, and facet hypertrophy. No high-grade osseous encroachment on the spinal canal. IMPRESSION: No acute thoracic spine fracture. Electronically Signed   By: Macy Mis M.D.   On: 03/20/2020 16:44   DG Chest Port 1 View  Result Date: 03/20/2020 CLINICAL DATA:  Status post fall.  Hit is head. EXAM: PORTABLE CHEST 1 VIEW COMPARISON:  12/28/2019 FINDINGS: No focal consolidation. No pleural effusion or pneumothorax. Heart and mediastinal contours are unremarkable. Prior median sternotomy. No acute osseous abnormality. IMPRESSION: No active disease. Electronically Signed   By: Kathreen Devoid   On: 03/20/2020 14:35   CT Angio Chest/Abd/Pel for Dissection W and/or Wo Contrast  Result Date: 03/20/2020 CLINICAL DATA:  Recent syncopal episode and history of abdominal pain and chest pain with fall, initial encounter EXAM: CT ANGIOGRAPHY CHEST, ABDOMEN AND PELVIS TECHNIQUE: Non-contrast CT of the chest was initially obtained. Multidetector CT imaging through the chest, abdomen and pelvis was performed using the standard protocol during bolus administration of intravenous contrast. Multiplanar reconstructed images and MIPs were obtained and reviewed to evaluate the vascular anatomy. CONTRAST:   143mL OMNIPAQUE IOHEXOL 350 MG/ML SOLN COMPARISON:  09/14/2019, 02/21/2015 FINDINGS: CTA CHEST FINDINGS Cardiovascular: Initial precontrast images demonstrate atherosclerotic calcification of the thoracic aorta. Aortic valve replacement is noted. No hyperdense crescent is identified to suggest acute aortic injury. There are changes consistent with ascending aortic repair in addition to the aortic valve replacement. No dissection is seen. Aneurysmal dilatation of the descending thoracic aorta is noted measuring 4.4 cm. No significant tapering is noted distally. Coronary calcifications are seen. Pulmonary artery shows no large central embolus although timing was not performed for pulmonary embolus evaluation. Mediastinum/Nodes: Thoracic inlet is within normal limits. No hilar or mediastinal adenopathy is noted. The esophagus as visualized shows mild distal wall thickening similar to that seen on the prior exam likely related to a degree of reflux. Lungs/Pleura: Lungs are well aerated bilaterally. No focal infiltrate or sizable effusion is noted. Mild emphysematous changes are seen. 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 This is stable from the prior exam. Degenerative changes of the thoracic spine are noted. No compression deformities are seen. Review of the MIP images confirms the above findings. CTA ABDOMEN AND PELVIS FINDINGS VASCULAR Aorta: Abdominal aorta demonstrates atherosclerotic calcifications. No evidence of aneurysmal dilatation is seen. Celiac: Mild narrowing of the celiac axis is noted proximally likely related to median arcuate compression. Minimal poststenotic dilatation is seen. SMA: Patent without evidence of aneurysm, dissection, vasculitis or significant stenosis. Renals: Single renal arteries are identified bilaterally. IMA: Patent without evidence of aneurysm, dissection, vasculitis or significant stenosis. Iliacs: Mild atherosclerotic calcifications are noted. No aneurysmal  dilatation or dissection is noted. Veins: No specific venous abnormality is seen. Review of the MIP images confirms the above findings. NON-VASCULAR Hepatobiliary: No focal liver abnormality is seen. No gallstones, gallbladder wall thickening, or biliary dilatation. Pancreas: Unremarkable. No pancreatic ductal dilatation or surrounding inflammatory changes. Spleen: Normal in size  without focal abnormality. Adrenals/Urinary Tract: Adrenal glands are within normal limits. Kidneys are well visualized bilaterally. No renal calculi or obstructive changes are seen. Large right renal cyst is noted which measures approximately 12 cm in greatest dimension. This is similar to that seen on the prior exam. A small exophytic area of calcification is noted along its superior margin laterally similar to that noted on previous exams. No obstructive changes are noted. The bladder is well distended. Changes consistent with urolift are noted. Stomach/Bowel: Colon shows scattered diverticular change without evidence of diverticulitis. No obstructive or inflammatory changes are noted. The appendix is within normal limits. No small bowel abnormality is seen. No gastric abnormality is noted. Lymphatic: No significant lymphadenopathy is noted. Reproductive: Prostate is unremarkable with the exception of uro lift device is superiorly. Other: No abdominal wall hernia or abnormality. No abdominopelvic ascites. Musculoskeletal: No acute or significant osseous findings. Review of the MIP images confirms the above findings. IMPRESSION: No evidence of aortic dissection. There are changes consistent with aortic valve repair and replacement of the ascending aorta. Stable dilatation of the descending aorta is noted. Changes suggestive of distal esophageal reflux. Chronic changes in the abdomen and pelvis similar to that seen on prior exams. Electronically Signed   By: Inez Catalina M.D.   On: 03/20/2020 16:51    EKG: Independently reviewed. Sinus  rhythm  Assessment/Plan Active Problems:   Syncope and collapse  Syncope and collapse History of thoracic aneurysm status post repair A. fib status post ablation with recurrence Recurrent syncope, previous episode last Thursday was seen in the ED given IV fluids and discharged with cardiology follow-up who have already ordered a 30-day monitor (suspicion was for breakthrough episode of A. fib with RVR triggering syncopal event).  This time, did fall on a hard surface and hit his head, imaging negative.  - Admit for observation on telemetry - Imaging negative for acute injury, will provide 2 doses of Percocet as needed for pain -Continue metoprolol and Xarelto - Echocardiogram - Orthostatic vitals - A.M. EKG  HTN -Continue losartan, metoprolol, spironolactone  DM -Continue Jardiance and Metformin  BPH -Continue Alfuzosin  DVT prophylaxis: Xarelto  Code Status:   Full  Family Communication:  None, discussed with patient at bedside. Disposition Plan:   Patient is from:  home  Anticipated DC to:  home Admission status:  Obs tele  Severity of Illness: The appropriate patient status for this patient is OBSERVATION. Observation status is judged to be reasonable and necessary in order to provide the required intensity of service to ensure the patient's safety. The patient's presenting symptoms, physical exam findings, and initial radiographic and laboratory data in the context of their medical condition is felt to place them at decreased risk for further clinical deterioration. Furthermore, it is anticipated that the patient will be medically stable for discharge from the hospital within 2 midnights of admission. The following factors support the patient status of observation.   " The patient's presenting symptoms include Syncope. " The physical exam findings include light headedness. " The initial radiographic and laboratory data are negative for acute injury, but show s/p anurysm  repair.  Marcelyn Bruins MD [facility]  How to contact the [facility] Attending or Consulting provider [place] or covering provider during after hours [place], for this patient?   1. Check the care team in [facility] and look for a) attending/consulting [facility] provider listed and b) the [facility] team listed 2. Log into [email or web address] and use [facility]'s universal password to access. If you do not have the password, please contact the hospital operator. 3. Locate the [facility] provider you are looking for under [facility] and page to a number that you can be directly reached. 4. If you still have difficulty reaching the provider, please page the [facility] (Director on Call) for the Hospitalists listed on amion for assistance.  03/20/2020, 9:36 PM

## 2020-03-20 NOTE — Progress Notes (Signed)
   03/20/20 1400  Clinical Encounter Type  Visited With Patient not available  Visit Type Trauma  Referral From Nurse  Consult/Referral To Chaplain  Chaplain responded to page for patient. No family present. No chaplain services needed. Chaplain will follow up US needed

## 2020-03-20 NOTE — ED Triage Notes (Signed)
Pt here from home with c/o dizziness and syncopal episode and fell hitting his head and back pain , pt is on a blood thinner

## 2020-03-20 NOTE — ED Provider Notes (Signed)
Patient care assumed at 1500.  Pt here as a level II trauma alert following syncopal event with head injury, on Xarelto.  CT pending.    Imaging is negative for acute traumatic injury. Given patient's recurrent syncopal event will observed in the hospital. Medicine consulted for observation admission.   Quintella Reichert, MD 03/20/20 [phone]

## 2020-03-20 NOTE — ED Provider Notes (Signed)
[facility] EMERGENCY DEPARTMENT Provider Note   CSN: [id number] Arrival date & time: 03/20/20  1402     History No chief complaint on file.   UCHENNA RAPPAPORT is a 74 y.o. male.  HPI     74yo male with history of atrial fibrillation, DM, hyperlipidemia, hypertension, thoracic aortic aneurysm s/p repair, CHF EF 38%, presents with concern for syncopal episode and fall on blood thinners as a level 2 trauma.   Reports he was standing out by his porch when he is not sure but thinks he may have felt lightheaded and had an episode of syncope.  He reports being aware at the moment his head in the floor but believes he passed out and is not sure exactly what happened. Denies any preceding chest pain, dyspnea. Palpitations, abdominal pain, back pain. No black or bloody stools, no infectious symptoms.  Reports severe back pain at this time. No numbness/weakness. Had headache initially but improved now Past Medical History:  Diagnosis Date  . Arthritis   . Atrial fibrillation ([place])   . BPH (benign prostatic hyperplasia)   . Diabetes mellitus type 2 in obese (HCC)    diet controlled  . HTN (hypertension)   . Hyperlipidemia, mixed   . Insomnia   . Obesity   . Thoracic aortic aneurysm, without rupture (HCC)    4.7 cm per chest ct with contrast 11-04-17    Patient Active Problem List   Diagnosis Date Noted  . 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 BRBPR (bright red blood per rectum) 09/06/2019  . Mucopurulent chronic bronchitis ([place]) 05/30/2019  . Benign prostatic hyperplasia without lower urinary tract symptoms 05/29/2019  . BPH with elevated PSA 05/29/2019  . Thoracic aortic aneurysm ([place]) 01/30/2019  . TSH elevation 10/22/2018  . Persistent atrial fibrillation ([employer]) 06/01/2018  . Claudication of both lower extremities ([place]) 07/15/2017  . Chronic diastolic CHF (congestive heart failure), NYHA class 2 ([place]) 07/06/2017  . Mild tetrahydrocannabinol (THC)  abuse 12/30/2016  . Essential hypertension 12/24/2016  . Hyperlipidemia LDL goal <130 05/11/2016  . Type 2 diabetes mellitus with complication, without long-term current use of insulin ([place]) 05/11/2016  . Routine general medical examination at a health care facility 10/06/2013  . COLONIC POLYPS 05/28/2009  . Obesity, morbid ([place]) 05/28/2009  . 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 DEGENERATIVE JOINT DISEASE 05/28/2009  . Insomnia w/ sleep apnea 05/28/2009    Past Surgical History:  Procedure Laterality Date  . AORTIC VALVE REPLACEMENT N/A 10/16/2019   Procedure: AORTIC VALVE REPLACEMENT (AVR) using Edwards PERIMOUNT Magna Ease 25MM Bioprosthesis Aortic Valve.;  Surgeon: [name], MD;  Location: [place];  Service: Open Heart Surgery;  Laterality: N/A;  Right subclavian artery cannulation.  . AORTIC VALVE REPLACEMENT  10/16/2019   AORTIC VALVE REPLACEMENT (AVR)   . ATRIAL FIBRILLATION ABLATION N/A 04/07/2019   Procedure: ATRIAL FIBRILLATION ABLATION;  Surgeon: [name], MD;  Location: [facility];  Service: Cardiovascular;  Laterality: N/A;  . CARDIOVERSION N/A 07/12/2018   Procedure: CARDIOVERSION;  Surgeon: Thayer Headings, MD;  Location: [facility] ENDOSCOPY;  Service: Cardiovascular;  Laterality: N/A;  . CARDIOVERSION N/A 08/22/2018   Procedure: CARDIOVERSION;  Surgeon: Skeet Latch, MD;  Location: [place];  Service: Cardiovascular;  Laterality: N/A;  . CARDIOVERSION N/A 06/28/2019   Procedure: CARDIOVERSION;  Surgeon: Acie Fredrickson Wonda Cheng, MD;  Location: [place];  Service: Cardiovascular;  Laterality: N/A;  . CLIPPING OF ATRIAL APPENDAGE N/A 10/16/2019   Procedure: Clipping  Of Atrial Appendage using AtriCure AtriClip Exclusion VLAA 66mm.;  Surgeon: [name], MD;  Location: [place];  Service: Open Heart Surgery;  Laterality: N/A;  . COLONOSCOPY  2016  . CYST EXCISION     polynomial  . CYSTOSCOPY WITH INSERTION OF UROLIFT N/A 04/18/2018    Procedure: CYSTOSCOPY WITH INSERTION OF UROLIFT;  Surgeon: Cleon Gustin, MD;  Location: [facility];  Service: Urology;  Laterality: N/A;  . KNEE SURGERY Right 2000   arthroscopy  . MAZE N/A 10/16/2019   Procedure: MAZE with bilateral pulmonary vein isolation.;  Surgeon: [name], MD;  Location: [place];  Service: Open Heart Surgery;  Laterality: N/A;  Maze procedure using Atricure OLL2 Isolator clamp.  . REPLACEMENT ASCENDING AORTA N/A 10/16/2019   Procedure: SUPRA CORONARY REPLACEMENT OF ASCENDING AORTA using Maquet Hemashiel Platinum 34 MM Vascular Graft.;  Surgeon: [name], MD;  Location: [place];  Service: Open Heart Surgery;  Laterality: N/A;  Right subclavian artery cannulation.  Marland Kitchen RIGHT/LEFT HEART CATH AND CORONARY ANGIOGRAPHY N/A 01/30/2019   Procedure: RIGHT/LEFT HEART CATH AND CORONARY ANGIOGRAPHY;  Surgeon: [name], [name], MD;  Location: [facility];  Service: Cardiovascular;  Laterality: N/A;  . TEE WITHOUT CARDIOVERSION N/A 10/16/2019   Procedure: TRANSESOPHAGEAL ECHOCARDIOGRAM (TEE);  Surgeon: [name], MD;  Location: [place];  Service: Open Heart Surgery;  Laterality: N/A;  . TONSILLECTOMY    . widson teeth extraction         Family History  Problem Relation Age of Onset  . Aneurysm Father   . Heart disease Father   . Varicose Veins Mother   . Arthritis Mother   . Macular degeneration Mother   . Cancer Neg Hx   . Colon cancer Neg Hx   . Esophageal cancer Neg Hx   . Rectal cancer Neg Hx   . Stomach cancer Neg Hx     Social History   Tobacco Use  . Smoking status: Former Smoker    Packs/day: 1.50    Years: 5.00    Pack years: 7.50    Types: Cigarettes    Quit date: 06/15/1994    Years since quitting: 25.7  . Smokeless tobacco: Never Used  Vaping Use  . Vaping Use: Never used  Substance Use Topics  . Alcohol use: Not Currently    Alcohol/week: 14.0 standard drinks    Types: 14 Shots of liquor per week     Comment: 1 drink of liquor per day  . Drug use: Yes    Types: Marijuana    Comment: occasionally marijuana    Home Medications Prior to Admission medications   Medication Sig Start Date End Date Taking? Authorizing Provider  alfuzosin (UROXATRAL) 10 MG 24 hr tablet Take 1 tablet (10 mg total) by mouth daily with breakfast. 01/18/20  Yes [name], MD  atorvastatin (LIPITOR) 20 MG tablet TAKE 1 TABLET BY MOUTH EVERYDAY AT BEDTIME Patient taking differently: Take 20 mg by mouth at bedtime.  03/11/20  Yes [name], MD  Empagliflozin-metFORMIN HCl ER (SYNJARDY XR) 25-1000 MG TB24 Take 1 tablet by mouth daily. 09/06/19  Yes [name], MD  losartan (COZAAR) 25 MG tablet Take 50 mg by mouth 2 (two) times daily.  03/06/20  Yes [provider]  Menthol-Methyl Salicylate (SALONPAS PAIN RELIEF PATCH EX) Apply 1 patch topically daily as needed (back pain).    Yes [provider]  metoprolol tartrate (LOPRESSOR) 25 MG tablet  Take 0.5 tablets (12.5 mg total) by mouth 2 (two) times daily. 03/11/20  Yes [name], MD  spironolactone (ALDACTONE) 25 MG tablet Take 0.5 tablets (12.5 mg total) by mouth at bedtime. 03/11/20  Yes [name], MD  triazolam (HALCION) 0.25 MG tablet TAKE 1 TABLET (0.25 MG TOTAL) BY MOUTH AT BEDTIME AS NEEDED FOR SLEEP. 01/29/20  Yes [name], MD  XARELTO 20 MG TABS tablet TAKE 1 TABLET EVERY DAY WITH SUPPER Patient taking differently: Take 20 mg by mouth daily with supper.  02/19/20  Yes [name], MD  alfuzosin (UROXATRAL) 10 MG 24 hr tablet Take 1 tablet (10 mg total) by mouth daily with breakfast. 05/29/19   [name], MD  atorvastatin (LIPITOR) 20 MG tablet Take 1 tablet (20 mg total) by mouth daily. 01/18/20   [name], MD  rivaroxaban (XARELTO) 20 MG TABS tablet Take 1 tablet (20 mg total) by mouth daily with supper. 01/31/19   [name], [name], MD  XARELTO 20 MG TABS tablet TAKE 1 TABLET (20 MG TOTAL) BY MOUTH DAILY  WITH SUPPER. Patient taking differently: Take 20 mg by mouth daily with supper.  09/08/19   [name], MD    Allergies    Quinolones  Review of Systems   Review of Systems  Constitutional: Negative for fever.  HENT: Negative for sore throat.   Eyes: Negative for visual disturbance.  Respiratory: Negative for shortness of breath.   Cardiovascular: Negative for chest pain.  Gastrointestinal: Negative for abdominal pain, blood in stool, diarrhea, nausea and vomiting.  Genitourinary: Negative for difficulty urinating.  Musculoskeletal: Positive for back pain. Negative for neck stiffness.  Skin: Negative for rash.  Neurological: Positive for syncope, light-headedness and headaches. Negative for facial asymmetry, speech difficulty, weakness and numbness.    Physical Exam Updated Vital Signs BP 126/90   Pulse 83   Resp 16   SpO2 98%   Physical Exam Vitals and nursing note reviewed.  Constitutional:      General: He is not in acute distress.    Appearance: He is well-developed. He is not diaphoretic.  HENT:     Head: Normocephalic and atraumatic.  Eyes:     Conjunctiva/sclera: Conjunctivae normal.  Cardiovascular:     Rate and Rhythm: Normal rate and regular rhythm.     Heart sounds: Normal heart sounds. No murmur heard.  No friction rub. No gallop.   Pulmonary:     Effort: Pulmonary effort is normal. No respiratory distress.     Breath sounds: Normal breath sounds. No wheezing or rales.  Abdominal:     General: There is no distension.     Palpations: Abdomen is soft.     Tenderness: There is no abdominal tenderness. There is no guarding.  Musculoskeletal:     Cervical back: Normal range of motion.  Skin:    General: Skin is warm and dry.  Neurological:     Mental Status: He is alert and oriented to person, place, and time.     ED Results / Procedures / Treatments   Labs (all labs ordered are listed, but only abnormal results are displayed) Labs Reviewed    COMPREHENSIVE METABOLIC PANEL - Abnormal; Notable for the following components:      Result Value   CO2 21 (*)    Glucose, Bld 120 (*)    Total Protein 6.2 (*)    All other components within normal limits  URINALYSIS, ROUTINE W REFLEX MICROSCOPIC -  Abnormal; Notable for the following components:   Specific Gravity, Urine 1.044 (*)    Glucose, UA >=500 (*)    Ketones, ur 20 (*)    All other components within normal limits  LACTIC ACID, PLASMA - Abnormal; Notable for the following components:   Lactic Acid, Venous 2.1 (*)    All other components within normal limits  PROTIME-INR - Abnormal; Notable for the following components:   Prothrombin Time 18.0 (*)    INR 1.5 (*)    All other components within normal limits  I-STAT CHEM 8, ED - Abnormal; Notable for the following components:   Glucose, Bld 114 (*)    Calcium, Ion 1.12 (*)    TCO2 19 (*)    All other components within normal limits  CBC WITH DIFFERENTIAL/PLATELET  ETHANOL  CBC  SAMPLE TO BLOOD BANK  TROPONIN I (HIGH SENSITIVITY)  TROPONIN I (HIGH SENSITIVITY)    EKG EKG Interpretation  Date/Time:  Wednesday March 20 2020 14:14:59 EDT Ventricular Rate:  72 PR Interval:    QRS Duration: 107 QT Interval:  416 QTC Calculation: 456 R Axis:   88 Text Interpretation: Sinus rhythm Borderline right axis deviation Confirmed by Quintella Reichert [phone]) on 03/20/2020 3:19:36 PM Also confirmed by Quintella Reichert [phone])  on 03/20/2020 6:35:03 PM   Radiology CT Head Wo Contrast  Result Date: 03/20/2020 CLINICAL DATA:  Dizziness, fall. EXAM: CT HEAD WITHOUT CONTRAST CT CERVICAL SPINE WITHOUT CONTRAST TECHNIQUE: Multidetector CT imaging of the head and cervical spine was performed following the standard protocol without intravenous contrast. Multiplanar CT image reconstructions of the cervical spine were also generated. COMPARISON:  None. FINDINGS: CT HEAD FINDINGS Brain: No evidence of acute infarction, hemorrhage, hydrocephalus,  extra-axial collection or mass lesion/mass effect. Vascular: No hyperdense vessel or unexpected calcification. Skull: Normal. Negative for fracture or focal lesion. Sinuses/Orbits: No acute finding. Other: None. CT CERVICAL SPINE FINDINGS Alignment: Minimal grade 1 anterolisthesis of C4-5 is noted secondary to posterior facet joint hypertrophy. Skull base and vertebrae: No acute fracture. No primary bone lesion or focal pathologic process. Soft tissues and spinal canal: No prevertebral fluid or swelling. No visible canal hematoma. Disc levels: Severe degenerative disc disease is noted at C5-6 and C6-7 with anterior posterior osteophyte formation. Upper chest: Negative. Other: None. IMPRESSION: 1. Normal head CT. 2. Severe multilevel degenerative disc disease. No acute abnormality seen in the cervical spine. Electronically Signed   By: Marijo Conception M.D.   On: 03/20/2020 16:47   CT Cervical Spine Wo Contrast  Result Date: 03/20/2020 CLINICAL DATA:  Dizziness, fall. EXAM: CT HEAD WITHOUT CONTRAST CT CERVICAL SPINE WITHOUT CONTRAST TECHNIQUE: Multidetector CT imaging of the head and cervical spine was performed following the standard protocol without intravenous contrast. Multiplanar CT image reconstructions of the cervical spine were also generated. COMPARISON:  None. FINDINGS: CT HEAD FINDINGS Brain: No evidence of acute infarction, hemorrhage, hydrocephalus, extra-axial collection or mass lesion/mass effect. Vascular: No hyperdense vessel or unexpected calcification. Skull: Normal. Negative for fracture or focal lesion. Sinuses/Orbits: No acute finding. Other: None. CT CERVICAL SPINE FINDINGS Alignment: Minimal grade 1 anterolisthesis of C4-5 is noted secondary to posterior facet joint hypertrophy. Skull base and vertebrae: No acute fracture. No primary bone lesion or focal pathologic process. Soft tissues and spinal canal: No prevertebral fluid or swelling. No visible canal hematoma. Disc levels: Severe  degenerative disc disease is noted at C5-6 and C6-7 with anterior posterior osteophyte formation. Upper chest: Negative. Other: None. IMPRESSION: 1. Normal head CT.  2. Severe multilevel degenerative disc disease. No acute abnormality seen in the cervical spine. Electronically Signed   By: Marijo Conception M.D.   On: 03/20/2020 16:47   CT T-SPINE NO CHARGE  Result Date: 03/20/2020 CLINICAL DATA:  Single episode with fall, back pain EXAM: CT Thoracic Spine with contrast TECHNIQUE: Multiplanar CT images of the thoracic spine were reconstructed from contemporary CT of the Chest. CONTRAST:  No additional COMPARISON:  None FINDINGS: Alignment: Anteroposterior alignment is maintained. Vertebrae: Vertebral body heights are maintained apart from degenerative endplate irregularity. No acute fracture. Paraspinal and other soft tissues: Unremarkable. Extra-spinal findings are better evaluated on concurrent dedicated imaging. Disc levels: Multilevel disc space narrowing, small endplate osteophytes, and facet hypertrophy. No high-grade osseous encroachment on the spinal canal. IMPRESSION: No acute thoracic spine fracture. Electronically Signed   By: Macy Mis M.D.   On: 03/20/2020 16:44   DG Chest Port 1 View  Result Date: 03/20/2020 CLINICAL DATA:  Status post fall.  Hit is head. EXAM: PORTABLE CHEST 1 VIEW COMPARISON:  12/28/2019 FINDINGS: No focal consolidation. No pleural effusion or pneumothorax. Heart and mediastinal contours are unremarkable. Prior median sternotomy. No acute osseous abnormality. IMPRESSION: No active disease. Electronically Signed   By: Kathreen Devoid   On: 03/20/2020 14:35   CT Angio Chest/Abd/Pel for Dissection W and/or Wo Contrast  Result Date: 03/20/2020 CLINICAL DATA:  Recent syncopal episode and history of abdominal pain and chest pain with fall, initial encounter EXAM: CT ANGIOGRAPHY CHEST, ABDOMEN AND PELVIS TECHNIQUE: Non-contrast CT of the chest was initially obtained.  Multidetector CT imaging through the chest, abdomen and pelvis was performed using the standard protocol during bolus administration of intravenous contrast. Multiplanar reconstructed images and MIPs were obtained and reviewed to evaluate the vascular anatomy. CONTRAST:  144mL OMNIPAQUE IOHEXOL 350 MG/ML SOLN COMPARISON:  09/14/2019, 02/21/2015 FINDINGS: CTA CHEST FINDINGS Cardiovascular: Initial precontrast images demonstrate atherosclerotic calcification of the thoracic aorta. Aortic valve replacement is noted. No hyperdense crescent is identified to suggest acute aortic injury. There are changes consistent with ascending aortic repair in addition to the aortic valve replacement. No dissection is seen. Aneurysmal dilatation of the descending thoracic aorta is noted measuring 4.4 cm. No significant tapering is noted distally. Coronary calcifications are seen. Pulmonary artery shows no large central embolus although timing was not performed for pulmonary embolus evaluation. Mediastinum/Nodes: Thoracic inlet is within normal limits. No hilar or mediastinal adenopathy is noted. The esophagus as visualized shows mild distal wall thickening similar to that seen on the prior exam likely related to a degree of reflux. Lungs/Pleura: Lungs are well aerated bilaterally. No focal infiltrate or sizable effusion is noted. Mild emphysematous changes are seen. 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 This is stable from the prior exam. Degenerative changes of the thoracic spine are noted. No compression deformities are seen. Review of the MIP images confirms the above findings. CTA ABDOMEN AND PELVIS FINDINGS VASCULAR Aorta: Abdominal aorta demonstrates atherosclerotic calcifications. No evidence of aneurysmal dilatation is seen. Celiac: Mild narrowing of the celiac axis is noted proximally likely related to median arcuate compression. Minimal poststenotic dilatation is seen. SMA: Patent without evidence of aneurysm,  dissection, vasculitis or significant stenosis. Renals: Single renal arteries are identified bilaterally. IMA: Patent without evidence of aneurysm, dissection, vasculitis or significant stenosis. Iliacs: Mild atherosclerotic calcifications are noted. No aneurysmal dilatation or dissection is noted. Veins: No specific venous abnormality is seen. Review of the MIP images confirms the above findings. NON-VASCULAR Hepatobiliary: No focal  liver abnormality is seen. No gallstones, gallbladder wall thickening, or biliary dilatation. Pancreas: Unremarkable. No pancreatic ductal dilatation or surrounding inflammatory changes. Spleen: Normal in size without focal abnormality. Adrenals/Urinary Tract: Adrenal glands are within normal limits. Kidneys are well visualized bilaterally. No renal calculi or obstructive changes are seen. Large right renal cyst is noted which measures approximately 12 cm in greatest dimension. This is similar to that seen on the prior exam. A small exophytic area of calcification is noted along its superior margin laterally similar to that noted on previous exams. No obstructive changes are noted. The bladder is well distended. Changes consistent with urolift are noted. Stomach/Bowel: Colon shows scattered diverticular change without evidence of diverticulitis. No obstructive or inflammatory changes are noted. The appendix is within normal limits. No small bowel abnormality is seen. No gastric abnormality is noted. Lymphatic: No significant lymphadenopathy is noted. Reproductive: Prostate is unremarkable with the exception of uro lift device is superiorly. Other: No abdominal wall hernia or abnormality. No abdominopelvic ascites. Musculoskeletal: No acute or significant osseous findings. Review of the MIP images confirms the above findings. IMPRESSION: No evidence of aortic dissection. There are changes consistent with aortic valve repair and replacement of the ascending aorta. Stable dilatation of the  descending aorta is noted. Changes suggestive of distal esophageal reflux. Chronic changes in the abdomen and pelvis similar to that seen on prior exams. Electronically Signed   By: Inez Catalina M.D.   On: 03/20/2020 16:51    Procedures Procedures (including critical care time)  Medications Ordered in ED Medications  ondansetron (ZOFRAN) injection 4 mg (0 mg Intravenous Hold 03/20/20 1530)  fentaNYL (SUBLIMAZE) 100 MCG/2ML injection (50 mcg  Given 03/20/20 1428)  iohexol (OMNIPAQUE) 350 MG/ML injection 100 mL (100 mLs Intravenous Contrast Given 03/20/20 1611)  oxyCODONE-acetaminophen (PERCOCET/ROXICET) 5-325 MG per tablet 1 tablet (1 tablet Oral Given 03/20/20 2028)    ED Course  I have reviewed the triage vital signs and the nursing notes.  Pertinent labs & imaging results that were available during my care of the patient were reviewed by me and considered in my medical decision making (see chart for details).    MDM Rules/Calculators/A&P                          74yo male with history of atrial fibrillation, DM, hyperlipidemia, hypertension, thoracic aortic aneurysm s/p repair, CHF EF 38%, presents with concern for syncopal episode and fall on blood thinners as a level 2 trauma.  Vital signs stable on arrival, mentating well.  EKG without acute findings.  CT head, CSpine and dissection study C/A/P ordered for evaluation for traumatic injuries as well as evaluate for findings in setting of back pain, syncope, hx of aortic repair.    No sign of significant anemia or electrolyte abnormalities. Dr. Ralene Bathe assuming care with imaging pending.   Final Clinical Impression(s) / ED Diagnoses Final diagnoses:  Fall  Syncope, unspecified syncope type    Rx / DC Orders ED Discharge Orders    None       Gareth Morgan, MD 03/20/20 2126

## 2020-03-21 ENCOUNTER — Other Ambulatory Visit: Payer: Self-pay

## 2020-03-21 ENCOUNTER — Observation Stay (HOSPITAL_BASED_OUTPATIENT_CLINIC_OR_DEPARTMENT_OTHER): Payer: Medicare HMO

## 2020-03-21 ENCOUNTER — Encounter (HOSPITAL_COMMUNITY): Payer: Self-pay | Admitting: Internal Medicine

## 2020-03-21 DIAGNOSIS — I10 Essential (primary) hypertension: Secondary | ICD-10-CM | POA: Diagnosis not present

## 2020-03-21 DIAGNOSIS — R55 Syncope and collapse: Secondary | ICD-10-CM

## 2020-03-21 DIAGNOSIS — E118 Type 2 diabetes mellitus with unspecified complications: Secondary | ICD-10-CM

## 2020-03-21 LAB — CBC
HCT: 43.8 % (ref 39.0–52.0)
Hemoglobin: 13.6 g/dL (ref 13.0–17.0)
MCH: 27.5 pg (ref 26.0–34.0)
MCHC: 31.1 g/dL (ref 30.0–36.0)
MCV: 88.5 fL (ref 80.0–100.0)
Platelets: 196 10*3/uL (ref 150–400)
RBC: 4.95 MIL/uL (ref 4.22–5.81)
RDW: 14.3 % (ref 11.5–15.5)
WBC: 9.4 10*3/uL (ref 4.0–10.5)
nRBC: 0 % (ref 0.0–0.2)

## 2020-03-21 LAB — BASIC METABOLIC PANEL
Anion gap: 10 (ref 5–15)
BUN: 14 mg/dL (ref 8–23)
CO2: 23 mmol/L (ref 22–32)
Calcium: 9 mg/dL (ref 8.9–10.3)
Chloride: 108 mmol/L (ref 98–111)
Creatinine, Ser: 0.95 mg/dL (ref 0.61–1.24)
GFR calc non Af Amer: >60 mL/min (ref >60)
Glucose, Bld: 114 mg/dL — ABNORMAL HIGH (ref 70–99)
Potassium: 3.6 mmol/L (ref 3.5–5.1)
Sodium: 141 mmol/L (ref 135–145)

## 2020-03-21 LAB — ECHOCARDIOGRAM COMPLETE
Area-P 1/2: 2.5 cm2
Height: 77 in
S' Lateral: 3.58 cm
Weight: 4432 oz

## 2020-03-21 LAB — HEMOGLOBIN A1C
Hgb A1c MFr Bld: 6.5 % — ABNORMAL HIGH (ref 4.8–5.6)
Mean Plasma Glucose: 139.85 mg/dL

## 2020-03-21 LAB — RESPIRATORY PANEL BY RT PCR (FLU A&B, COVID)
Influenza A by PCR: NEGATIVE
Influenza B by PCR: NEGATIVE
SARS Coronavirus 2 by RT PCR: NEGATIVE

## 2020-03-21 LAB — GLUCOSE, CAPILLARY
Glucose-Capillary: 229 mg/dL — ABNORMAL HIGH (ref 70–99)
Glucose-Capillary: 184 mg/dL — ABNORMAL HIGH (ref 70–99)
Glucose-Capillary: 155 mg/dL — ABNORMAL HIGH (ref 70–99)

## 2020-03-21 MED ORDER — INFLUENZA VAC A&B SA ADJ QUAD 0.5 ML IM PRSY
0.5000 mL | PREFILLED_SYRINGE | INTRAMUSCULAR | Status: AC
  Administered 2020-03-22: 0.5 mL via INTRAMUSCULAR
  Filled 2020-03-21: qty 0.5

## 2020-03-21 MED ORDER — LOSARTAN POTASSIUM 25 MG PO TABS
25.0000 mg | ORAL_TABLET | Freq: Two times a day (BID) | ORAL | Status: DC
  Administered 2020-03-21 – 2020-03-22 (×2): 25 mg via ORAL
  Filled 2020-03-21 (×2): qty 1

## 2020-03-21 MED ORDER — PERFLUTREN LIPID MICROSPHERE
1.0000 mL | INTRAVENOUS | Status: AC | PRN
  Administered 2020-03-21: 2 mL via INTRAVENOUS
  Filled 2020-03-21: qty 10

## 2020-03-21 MED ORDER — INSULIN ASPART 100 UNIT/ML ~~LOC~~ SOLN: 0.0000 [IU] | Freq: Every day | SUBCUTANEOUS | Status: DC

## 2020-03-21 MED ORDER — SODIUM CHLORIDE 0.9 % IV SOLN: INTRAVENOUS | Status: AC

## 2020-03-21 MED ORDER — INSULIN ASPART 100 UNIT/ML ~~LOC~~ SOLN
0.0000 [IU] | Freq: Three times a day (TID) | SUBCUTANEOUS | Status: DC
  Administered 2020-03-22: 2 [IU] via SUBCUTANEOUS

## 2020-03-21 MED ORDER — OXYCODONE-ACETAMINOPHEN 5-325 MG PO TABS
1.0000 | ORAL_TABLET | Freq: Once | ORAL | Status: AC
  Administered 2020-03-21: 1 via ORAL

## 2020-03-21 MED ORDER — TRIAZOLAM 0.125 MG PO TABS
0.2500 mg | ORAL_TABLET | Freq: Every evening | ORAL | Status: DC | PRN
  Administered 2020-03-21: 0.25 mg via ORAL
  Filled 2020-03-21: qty 2

## 2020-03-21 MED ORDER — OXYCODONE-ACETAMINOPHEN 5-325 MG PO TABS
1.0000 | ORAL_TABLET | Freq: Three times a day (TID) | ORAL | Status: DC | PRN
  Administered 2020-03-21 – 2020-03-22 (×3): 1 via ORAL
  Filled 2020-03-21 (×3): qty 1

## 2020-03-21 MED ORDER — OXYCODONE-ACETAMINOPHEN 5-325 MG PO TABS
1.0000 | ORAL_TABLET | Freq: Once | ORAL | Status: DC
Start: 2020-03-21 — End: 2020-03-21

## 2020-03-21 NOTE — Plan of Care (Signed)
  Problem: Education: Goal: Knowledge of General Education information will improve Description Including pain rating scale, medication(s)/side effects and non-pharmacologic comfort measures Outcome: Progressing   

## 2020-03-21 NOTE — ED Notes (Signed)
Pt admitted to [facility]; report called to Ray, RN; meds to be given before transport.

## 2020-03-21 NOTE — Progress Notes (Signed)
   03/21/20 1431  Assess: MEWS Score  BP 92/67  Pulse Rate (!) 103  Resp 20  SpO2 96 %  O2 Device Room Air  Assess: MEWS Score  MEWS Temp 0  MEWS Systolic 1  MEWS Pulse 1  MEWS RR 0  MEWS LOC 0  MEWS Score 2  MEWS Score Color Yellow  Assess: if the MEWS score is Yellow or Red  Were vital signs taken at a resting state? No  Focused Assessment No change from prior assessment  Early Detection of Sepsis Score *See Row Information* Low  MEWS guidelines implemented *See Row Information* Yes  Treat  MEWS Interventions Other (Comment) (MD aware)  Pain Scale 0-10  Pain Score 4  Pain Type Acute pain  Pain Location Back  Pain Orientation Mid  Pain Descriptors / Indicators Aching  Patients Stated Pain Goal 3  Pain Intervention(s) Repositioned  Multiple Pain Sites No  Take Vital Signs  Increase Vital Sign Frequency  Yellow: Q 2hr X 2 then Q 4hr X 2, if remains yellow, continue Q 4hrs  Escalate  MEWS: Escalate Yellow: discuss with charge nurse/RN and consider discussing with provider and RRT  Notify: Charge Nurse/RN  Name of Charge Nurse/RN Notified Jasmina, RN  Date Charge Nurse/RN Notified 03/21/20  Time Charge Nurse/RN Notified 1440  Notify: Provider  Provider Name/Title Dr. Eliseo Squires  Date Provider Notified 03/21/20  Time Provider Notified 1445  Notification Type Call (secure chat)  Notification Reason Other (Comment) (make aware of orthostatic vital sign results)  Response See new orders  Date of Provider Response 03/21/20  Time of Provider Response 1446  Notify: Rapid Response  Name of Rapid Response RN Notified  (not notified, orthostatic vital signs)  Document  Patient Outcome Stabilized after interventions  Progress note created (see row info) Yes    Patient mews turned yellow with orthostatic vital signs. Will continue to monitor.

## 2020-03-21 NOTE — Progress Notes (Signed)
Progress Note    SHEM PLEMMONS  XKG:[id number] DOB: [birth year]  DOA: 03/20/2020 PCP: [name], MD    Brief Narrative:     Medical records reviewed and are as summarized below:  Clinton Black is an 74 y.o. male with medical history significant of thoracic aortic aneurysm status post AVR with bioprosthetic valve, aortic graft, and modified MA ZE with bilateral pulmonary vein isolation in May 2021; A. fib status post ablation with recurrence; diabetes; hypertension; hyperlipidemia; and BPH who presented following an episode of syncope and collapse.  He states he has had a degree of lightheadedness since his aortic procedure in May.  He has had intermittent symptoms since that time with an episode of syncope this past Thursday.  During that episode he was able to make it to his bed and did not hit his head.  He was taken to the ED at that time given a liter of fluids and was asymptomatic he was discharged with close follow-up with cardiology.  Cardiology suspected an episode of A. fib with RVR leading to his syncopal event and have ordered a 30-day monitor which she has not received yet.  Today, patient experienced another episode of syncope which was preceded by similar lightheadedness and clammy sensation.  He fell and did hit his head.  No significant acute changes on imaging in ED.  He denies chest pain shortness of breath, abdominal pain, fever, nausea, diarrhea, sick contacts  Assessment/Plan:   Active Problems:   Type 2 diabetes mellitus with complication, without long-term current use of insulin (HCC)   Essential hypertension   Thoracic aortic aneurysm (HCC)   S/P ascending aortic replacement   Syncope and collapse  Orthostatic hypotension -gentle IVF -TED hose -recheck in AM  Syncope and collapse History of thoracic aneurysm status post repair A. fib status post ablation with recurrence Recurrent syncope, previous episode last Thursday was seen in the ED given IV  fluids and discharged with cardiology follow-up who have already ordered a 30-day monitor (suspicion was for breakthrough episode of A. fib with RVR triggering syncopal event).  This time, did fall on a hard surface and hit his head, imaging negative.  - continue tele -Continue metoprolol and Xarelto - Echocardiogram read pending  HTN -Continue losartan, metoprolol -hold spironolactone  DM -Continue Jardiance and Metformin  BPH -Continue Alfuzosin  obesity Body mass index is 32.85 kg/m.   Family Communication/Anticipated D/C date and plan/Code Status   DVT prophylaxis: xarelto Code Status: Full Code.   Disposition Plan: Status is: Observation    Dispo: The patient is from: Home              Anticipated d/c is to: Home              Anticipated d/c date is: 1 day              Patient currently is not medically stable to d/c.         Medical Consultants:    None.     Subjective:   C/o back pain- better with ice  Objective:    Vitals:   03/21/20 1421 03/21/20 1424 03/21/20 1426 03/21/20 1431  BP: 126/77 123/81 97/68 92/67   Pulse: 70 74 100 (!) 103  Resp: 18 20 (!) 21 20  Temp: 98.4 F (36.9 C) 97.9 F (36.6 C)    TempSrc: Oral Oral    SpO2: 95% 97% 99% 96%  Weight:      Height:  Intake/Output Summary (Last 24 hours) at 03/21/2020 1514 Last data filed at 03/21/2020 1432 Gross per 24 hour  Intake 480 ml  Output 1375 ml  Net -895 ml   Filed Weights   03/21/20 0331  Weight: 125.6 kg    Exam:  General: Appearance:    Obese male in no acute distress     Lungs:     Clear to auscultation bilaterally, respirations unlabored  Heart:    Tachycardic. No murmurs, rubs, or gallops.   MS:   All extremities are intact.   Neurologic:   Awake, alert, oriented x 3. No apparent focal neurological           defect.     Data Reviewed:   I have personally reviewed following labs and imaging studies:  Labs: Labs show the following:   Basic  Metabolic Panel: Recent Labs  Lab 03/20/20 1418 03/20/20 1418 03/20/20 1436 03/21/20 0303  NA 140  --  141 141  K 4.2   < > 4.1 3.6  CL 107  --  108 108  CO2 21*  --   --  23  GLUCOSE 120*  --  114* 114*  BUN 15  --  18 14  CREATININE 1.01  --  1.00 0.95  CALCIUM 8.9  --   --  9.0   < > = values in this interval not displayed.   GFR Estimated Creatinine Clearance: 103.1 mL/min (by C-G formula based on SCr of 0.95 mg/dL). Liver Function Tests: Recent Labs  Lab 03/20/20 1418  AST 17  ALT 17  ALKPHOS 84  BILITOT 0.9  PROT 6.2*  ALBUMIN 4.0   No results for input(s): LIPASE, AMYLASE in the last 168 hours. No results for input(s): AMMONIA in the last 168 hours. Coagulation profile Recent Labs  Lab 03/20/20 1418  INR 1.5*    CBC: Recent Labs  Lab 03/20/20 1418 03/20/20 1436 03/21/20 0303  WBC 8.8  --  9.4  NEUTROABS 6.4  --   --   HGB 14.1 14.3 13.6  HCT 43.3 42.0 43.8  MCV 86.4  --  88.5  PLT 199  --  196   Cardiac Enzymes: No results for input(s): CKTOTAL, CKMB, CKMBINDEX, TROPONINI in the last 168 hours. BNP (last 3 results) Recent Labs    05/29/19 1430  PROBNP 95.0   CBG: Recent Labs  Lab 03/21/20 0610  GLUCAP 229*   D-Dimer: No results for input(s): DDIMER in the last 72 hours. Hgb A1c: No results for input(s): HGBA1C in the last 72 hours. Lipid Profile: No results for input(s): CHOL, HDL, LDLCALC, TRIG, CHOLHDL, LDLDIRECT in the last 72 hours. Thyroid function studies: No results for input(s): TSH, T4TOTAL, T3FREE, THYROIDAB in the last 72 hours.  Invalid input(s): FREET3 Anemia work up: No results for input(s): VITAMINB12, FOLATE, FERRITIN, TIBC, IRON, RETICCTPCT in the last 72 hours. Sepsis Labs: Recent Labs  Lab 03/20/20 1418 03/21/20 0303  WBC 8.8 9.4  LATICACIDVEN 2.1*  --     Microbiology Recent Results (from the past 240 hour(s))  Respiratory Panel by RT PCR (Flu A&B, Covid) - Nasopharyngeal Swab     Status: None    Collection Time: 03/21/20  2:40 AM   Specimen: Nasopharyngeal Swab  Result Value Ref Range Status   SARS Coronavirus 2 by RT PCR NEGATIVE NEGATIVE Final    Comment: (NOTE) SARS-CoV-2 target nucleic acids are NOT DETECTED.  The SARS-CoV-2 RNA is generally detectable in upper respiratoy specimens during the acute  phase of infection. The lowest concentration of SARS-CoV-2 viral copies this assay can detect is 131 copies/mL. A negative result does not preclude SARS-Cov-2 infection and should not be used as the sole basis for treatment or other patient management decisions. A negative result may occur with  improper specimen collection/handling, submission of specimen other than nasopharyngeal swab, presence of viral mutation(s) within the areas targeted by this assay, and inadequate number of viral copies (<131 copies/mL). A negative result must be combined with clinical observations, patient history, and epidemiological information. The expected result is Negative.  Fact Sheet for Patients:  PinkCheek.be  Fact Sheet for Healthcare Providers:  GravelBags.it  This test is no t yet approved or cleared by the [place] FDA and  has been authorized for detection and/or diagnosis of SARS-CoV-2 by FDA under an Emergency Use Authorization (EUA). This EUA will remain  in effect (meaning this test can be used) for the duration of the COVID-19 declaration under Section 564(b)(1) of the Act, 21 U.S.C. section 360bbb-3(b)(1), unless the authorization is terminated or revoked sooner.     Influenza A by PCR NEGATIVE NEGATIVE Final   Influenza B by PCR NEGATIVE NEGATIVE Final    Comment: (NOTE) The Xpert Xpress SARS-CoV-2/FLU/RSV assay is intended as an aid in  the diagnosis of influenza from Nasopharyngeal swab specimens and  should not be used as a sole basis for treatment. Nasal washings and  aspirates are unacceptable for Xpert  Xpress SARS-CoV-2/FLU/RSV  testing.  Fact Sheet for Patients: PinkCheek.be  Fact Sheet for Healthcare Providers: GravelBags.it  This test is not yet approved or cleared by the [place] FDA and  has been authorized for detection and/or diagnosis of SARS-CoV-2 by  FDA under an Emergency Use Authorization (EUA). This EUA will remain  in effect (meaning this test can be used) for the duration of the  Covid-19 declaration under Section 564(b)(1) of the Act, 21  U.S.C. section 360bbb-3(b)(1), unless the authorization is  terminated or revoked. Performed at [facility], [place] [address] [place], [place] [address]     Procedures and diagnostic studies:  CT Head Wo Contrast  Result Date: 03/20/2020 CLINICAL DATA:  Dizziness, fall. EXAM: CT HEAD WITHOUT CONTRAST CT CERVICAL SPINE WITHOUT CONTRAST TECHNIQUE: Multidetector CT imaging of the head and cervical spine was performed following the standard protocol without intravenous contrast. Multiplanar CT image reconstructions of the cervical spine were also generated. COMPARISON:  None. FINDINGS: CT HEAD FINDINGS Brain: No evidence of acute infarction, hemorrhage, hydrocephalus, extra-axial collection or mass lesion/mass effect. Vascular: No hyperdense vessel or unexpected calcification. Skull: Normal. Negative for fracture or focal lesion. Sinuses/Orbits: No acute finding. Other: None. CT CERVICAL SPINE FINDINGS Alignment: Minimal grade 1 anterolisthesis of C4-5 is noted secondary to posterior facet joint hypertrophy. Skull base and vertebrae: No acute fracture. No primary bone lesion or focal pathologic process. Soft tissues and spinal canal: No prevertebral fluid or swelling. No visible canal hematoma. Disc levels: Severe degenerative disc disease is noted at C5-6 and C6-7 with anterior posterior osteophyte formation. Upper chest: Negative. Other: None. IMPRESSION: 1. Normal head CT.  2. Severe multilevel degenerative disc disease. No acute abnormality seen in the cervical spine. Electronically Signed   By: Marijo Conception M.D.   On: 03/20/2020 16:47   CT Cervical Spine Wo Contrast  Result Date: 03/20/2020 CLINICAL DATA:  Dizziness, fall. EXAM: CT HEAD WITHOUT CONTRAST CT CERVICAL SPINE WITHOUT CONTRAST TECHNIQUE: Multidetector CT imaging of the head and cervical spine was  performed following the standard protocol without intravenous contrast. Multiplanar CT image reconstructions of the cervical spine were also generated. COMPARISON:  None. FINDINGS: CT HEAD FINDINGS Brain: No evidence of acute infarction, hemorrhage, hydrocephalus, extra-axial collection or mass lesion/mass effect. Vascular: No hyperdense vessel or unexpected calcification. Skull: Normal. Negative for fracture or focal lesion. Sinuses/Orbits: No acute finding. Other: None. CT CERVICAL SPINE FINDINGS Alignment: Minimal grade 1 anterolisthesis of C4-5 is noted secondary to posterior facet joint hypertrophy. Skull base and vertebrae: No acute fracture. No primary bone lesion or focal pathologic process. Soft tissues and spinal canal: No prevertebral fluid or swelling. No visible canal hematoma. Disc levels: Severe degenerative disc disease is noted at C5-6 and C6-7 with anterior posterior osteophyte formation. Upper chest: Negative. Other: None. IMPRESSION: 1. Normal head CT. 2. Severe multilevel degenerative disc disease. No acute abnormality seen in the cervical spine. Electronically Signed   By: Marijo Conception M.D.   On: 03/20/2020 16:47   CT T-SPINE NO CHARGE  Result Date: 03/20/2020 CLINICAL DATA:  Single episode with fall, back pain EXAM: CT Thoracic Spine with contrast TECHNIQUE: Multiplanar CT images of the thoracic spine were reconstructed from contemporary CT of the Chest. CONTRAST:  No additional COMPARISON:  None FINDINGS: Alignment: Anteroposterior alignment is maintained. Vertebrae: Vertebral body heights are  maintained apart from degenerative endplate irregularity. No acute fracture. Paraspinal and other soft tissues: Unremarkable. Extra-spinal findings are better evaluated on concurrent dedicated imaging. Disc levels: Multilevel disc space narrowing, small endplate osteophytes, and facet hypertrophy. No high-grade osseous encroachment on the spinal canal. IMPRESSION: No acute thoracic spine fracture. Electronically Signed   By: Macy Mis M.D.   On: 03/20/2020 16:44   DG Chest Port 1 View  Result Date: 03/20/2020 CLINICAL DATA:  Status post fall.  Hit is head. EXAM: PORTABLE CHEST 1 VIEW COMPARISON:  12/28/2019 FINDINGS: No focal consolidation. No pleural effusion or pneumothorax. Heart and mediastinal contours are unremarkable. Prior median sternotomy. No acute osseous abnormality. IMPRESSION: No active disease. Electronically Signed   By: Kathreen Devoid   On: 03/20/2020 14:35   ECHOCARDIOGRAM COMPLETE  Result Date: 03/21/2020    ECHOCARDIOGRAM REPORT   Patient Name:   AADYN BUCHHEIT Date of Exam: 03/21/2020 Medical Rec #:  [id number]       Height:       77.0 in Accession #:    [id number]      Weight:       277.0 lb Date of Birth:  06/16/1945      BSA:          2.569 m Patient Age:    23 years        BP:           130/79 mmHg Patient Gender: M               HR:           70 bpm. Exam Location:  Inpatient Procedure: 2D Echo, Cardiac Doppler, Color Doppler and Intracardiac            Opacification Agent Indications:     [place]  History:         Patient has prior history of Echocardiogram examinations, most                  recent 12/01/2019. Aortic Valve Disease, Arrythmias:Atrial                  Fibrillation, Signs/Symptoms:Syncope; Risk  Factors:Hypertension, Diabetes, Dyslipidemia and Obesity. MAZE                  procedure.                  Aortic Valve: 25 mm Edwards bioprosthetic valve is present in                  the aortic position.  Sonographer:     Dustin Flock Referring  Phys:  [id number] [place] Diagnosing Phys: Loralie Champagne MD IMPRESSIONS  1. Left ventricular ejection fraction, by estimation, is 55 to 60%. The left ventricle has normal function. The left ventricle has no regional wall motion abnormalities. There is mild left ventricular hypertrophy. Left ventricular diastolic parameters are consistent with Grade I diastolic dysfunction (impaired relaxation).  2. Right ventricular systolic function is mildly reduced. The right ventricular size is mildly enlarged. Tricuspid regurgitation signal is inadequate for assessing PA pressure. Mildly D-shaped interventricular septum suggests a degree of RV pressure/volume overload.  3. Right atrial size was mildly dilated.  4. The mitral valve is normal in structure. No evidence of mitral valve regurgitation. No evidence of mitral stenosis.  5. There is a bioprosthetic aortic valve. No significant regurgitation or stenosis.  6. The inferior vena cava is normal in size with greater than 50% respiratory variability, suggesting right atrial pressure of 3 mmHg. FINDINGS  Left Ventricle: Left ventricular ejection fraction, by estimation, is 55 to 60%. The left ventricle has normal function. The left ventricle has no regional wall motion abnormalities. Definity contrast agent was given IV to delineate the left ventricular  endocardial borders. The left ventricular internal cavity size was normal in size. There is mild left ventricular hypertrophy. Left ventricular diastolic parameters are consistent with Grade I diastolic dysfunction (impaired relaxation). Right Ventricle: The right ventricular size is mildly enlarged. No increase in right ventricular wall thickness. Right ventricular systolic function is mildly reduced. Tricuspid regurgitation signal is inadequate for assessing PA pressure. Left Atrium: Left atrial size was normal in size. Right Atrium: Right atrial size was mildly dilated. Pericardium: There is no evidence of  pericardial effusion. Mitral Valve: The mitral valve is normal in structure. No evidence of mitral valve regurgitation. No evidence of mitral valve stenosis. Tricuspid Valve: The tricuspid valve is normal in structure. Tricuspid valve regurgitation is not demonstrated. Aortic Valve: There is a bioprosthetic aortic valve. No significant regurgitation or stenosis. The aortic valve has been repaired/replaced. Aortic valve regurgitation is not visualized. No aortic stenosis is present. There is a 25 mm Edwards bioprosthetic valve present in the aortic position. Pulmonic Valve: The pulmonic valve was normal in structure. Pulmonic valve regurgitation is not visualized. Aorta: The aortic root is normal in size and structure. Venous: The inferior vena cava is normal in size with greater than 50% respiratory variability, suggesting right atrial pressure of 3 mmHg. IAS/Shunts: No atrial level shunt detected by color flow Doppler.  LEFT VENTRICLE PLAX 2D LVIDd:         4.62 cm  Diastology LVIDs:         3.58 cm  LV e' medial:    6.85 cm/s LV PW:         1.56 cm  LV E/e' medial:  7.4 LV IVS:        1.50 cm  LV e' lateral:   7.51 cm/s LVOT diam:     2.30 cm  LV E/e' lateral: 6.8 LV SV:  105 LV SV Index:   41 LVOT Area:     4.15 cm  RIGHT VENTRICLE RV Basal diam:  4.43 cm RV S prime:     7.40 cm/s TAPSE (M-mode): 3.6 cm LEFT ATRIUM             Index       RIGHT ATRIUM           Index LA diam:        3.60 cm 1.40 cm/m  RA Area:     19.60 cm LA Vol (A2C):   41.9 ml 16.31 ml/m RA Volume:   60.80 ml  23.67 ml/m LA Vol (A4C):   69.3 ml 26.98 ml/m LA Biplane Vol: 57.6 ml 22.42 ml/m  AORTIC VALVE LVOT Vmax:   122.00 cm/s LVOT Vmean:  76.300 cm/s LVOT VTI:    0.252 m  AORTA Ao Root diam: 3.20 cm MITRAL VALVE MV Area (PHT): 2.50 cm    SHUNTS MV Decel Time: 303 msec    Systemic VTI:  0.25 m MV E velocity: 51.00 cm/s  Systemic Diam: 2.30 cm MV A velocity: 60.80 cm/s MV E/A ratio:  0.84 Loralie Champagne MD Electronically signed  by Loralie Champagne MD Signature Date/Time: 03/21/2020/2:59:22 PM    Final (Updated)    CT Angio Chest/Abd/Pel for Dissection W and/or Wo Contrast  Result Date: 03/20/2020 CLINICAL DATA:  Recent syncopal episode and history of abdominal pain and chest pain with fall, initial encounter EXAM: CT ANGIOGRAPHY CHEST, ABDOMEN AND PELVIS TECHNIQUE: Non-contrast CT of the chest was initially obtained. Multidetector CT imaging through the chest, abdomen and pelvis was performed using the standard protocol during bolus administration of intravenous contrast. Multiplanar reconstructed images and MIPs were obtained and reviewed to evaluate the vascular anatomy. CONTRAST:  187mL OMNIPAQUE IOHEXOL 350 MG/ML SOLN COMPARISON:  09/14/2019, 02/21/2015 FINDINGS: CTA CHEST FINDINGS Cardiovascular: Initial precontrast images demonstrate atherosclerotic calcification of the thoracic aorta. Aortic valve replacement is noted. No hyperdense crescent is identified to suggest acute aortic injury. There are changes consistent with ascending aortic repair in addition to the aortic valve replacement. No dissection is seen. Aneurysmal dilatation of the descending thoracic aorta is noted measuring 4.4 cm. No significant tapering is noted distally. Coronary calcifications are seen. Pulmonary artery shows no large central embolus although timing was not performed for pulmonary embolus evaluation. Mediastinum/Nodes: Thoracic inlet is within normal limits. No hilar or mediastinal adenopathy is noted. The esophagus as visualized shows mild distal wall thickening similar to that seen on the prior exam likely related to a degree of reflux. Lungs/Pleura: Lungs are well aerated bilaterally. No focal infiltrate or sizable effusion is noted. Mild emphysematous changes are seen. 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 This is stable from the prior exam. Degenerative changes of the thoracic spine are noted. No compression deformities are seen.  Review of the MIP images confirms the above findings. CTA ABDOMEN AND PELVIS FINDINGS VASCULAR Aorta: Abdominal aorta demonstrates atherosclerotic calcifications. No evidence of aneurysmal dilatation is seen. Celiac: Mild narrowing of the celiac axis is noted proximally likely related to median arcuate compression. Minimal poststenotic dilatation is seen. SMA: Patent without evidence of aneurysm, dissection, vasculitis or significant stenosis. Renals: Single renal arteries are identified bilaterally. IMA: Patent without evidence of aneurysm, dissection, vasculitis or significant stenosis. Iliacs: Mild atherosclerotic calcifications are noted. No aneurysmal dilatation or dissection is noted. Veins: No specific venous abnormality is seen. Review of the MIP images confirms the above findings. NON-VASCULAR Hepatobiliary: No focal liver abnormality  is seen. No gallstones, gallbladder wall thickening, or biliary dilatation. Pancreas: Unremarkable. No pancreatic ductal dilatation or surrounding inflammatory changes. Spleen: Normal in size without focal abnormality. Adrenals/Urinary Tract: Adrenal glands are within normal limits. Kidneys are well visualized bilaterally. No renal calculi or obstructive changes are seen. Large right renal cyst is noted which measures approximately 12 cm in greatest dimension. This is similar to that seen on the prior exam. A small exophytic area of calcification is noted along its superior margin laterally similar to that noted on previous exams. No obstructive changes are noted. The bladder is well distended. Changes consistent with urolift are noted. Stomach/Bowel: Colon shows scattered diverticular change without evidence of diverticulitis. No obstructive or inflammatory changes are noted. The appendix is within normal limits. No small bowel abnormality is seen. No gastric abnormality is noted. Lymphatic: No significant lymphadenopathy is noted. Reproductive: Prostate is unremarkable with  the exception of uro lift device is superiorly. Other: No abdominal wall hernia or abnormality. No abdominopelvic ascites. Musculoskeletal: No acute or significant osseous findings. Review of the MIP images confirms the above findings. IMPRESSION: No evidence of aortic dissection. There are changes consistent with aortic valve repair and replacement of the ascending aorta. Stable dilatation of the descending aorta is noted. Changes suggestive of distal esophageal reflux. Chronic changes in the abdomen and pelvis similar to that seen on prior exams. Electronically Signed   By: Inez Catalina M.D.   On: 03/20/2020 16:51    Medications:   . alfuzosin  10 mg Oral Q breakfast  . atorvastatin  20 mg Oral QHS  . empagliflozin  25 mg Oral Daily  . [START ON 03/22/2020] influenza vaccine adjuvanted  0.5 mL Intramuscular Tomorrow-1000  . insulin aspart  0-15 Units Subcutaneous TID WC  . insulin aspart  0-5 Units Subcutaneous QHS  . losartan  50 mg Oral BID  . metoprolol tartrate  12.5 mg Oral BID  . ondansetron (ZOFRAN) IV  4 mg Intravenous Once  . rivaroxaban  20 mg Oral Q supper  . sodium chloride flush  3 mL Intravenous Q12H  . spironolactone  12.5 mg Oral QHS   Continuous Infusions: . sodium chloride 100 mL/hr at 03/21/20 1511     LOS: 0 days   Geradine Girt  [facility]   How to contact the [facility] Attending or Consulting provider [place] or covering provider during after hours [place], for this patient?  1. Check the care team in [facility] and look for a) attending/consulting [facility] provider listed and b) the [facility] team listed 2. Log into [email or web address] and use [place]'s universal password to access. If you do not have the password, please contact the hospital operator. 3. Locate the [facility] provider you are looking for under [facility] and page to a number that you can be directly reached. 4. If you still have difficulty reaching the provider, please page the [facility] (Director on Call) for the  Hospitalists listed on amion for assistance.  03/21/2020, 3:14 PM

## 2020-03-21 NOTE — Plan of Care (Signed)
  Problem: Education: Goal: Knowledge of General Education information will improve Description: Including pain rating scale, medication(s)/side effects and non-pharmacologic comfort measures 03/21/2020 0457 by Nelia Shi, RN Outcome: Progressing 03/21/2020 0457 by Nelia Shi, RN Outcome: Progressing

## 2020-03-21 NOTE — Evaluation (Signed)
Physical Therapy Evaluation Patient Details Name: Clinton Black MRN: [id number] DOB: [birth year] Today's Date: 03/21/2020   History of Present Illness  74 y.o. male with medical history significant of thoracic aortic aneurysm status post AVR with bioprosthetic valve, aortic graft, and modified MA ZE with bilateral pulmonary vein isolation in May 2021; A. fib status post ablation with recurrence; diabetes; hypertension; hyperlipidemia; and BPH who presented following an episode of syncope and collapse. Pt reports symptoms of lightheadedness since aortic procedure in May. Pt with 2 recent syncopal episodes, most recent on 10/6 resulting in fall.  Clinical Impression  Pt presents to PT with deficits in gait, endurance, cardiopulmonary function, and with back pain. Pt mobilizes in the room well, without PT physical assistance. Pt does continue to demonstrate orthostatic BP with changes in position. PT provides education on strategies to mitigate risk of syncopal episodes including use of compression stockings and LE exercise between position changes. Pt will benefit from continued acute PT POC to improve activity tolerance and to continue to provide reinforcement of education. PT recommends no PT needs at the time of discharge.    Follow Up Recommendations No PT follow up;Supervision - Intermittent    Equipment Recommendations  None recommended by PT    Recommendations for Other Services       Precautions / Restrictions Precautions Precautions: Fall Precaution Comments: monitor BP Restrictions Weight Bearing Restrictions: No      Mobility  Bed Mobility               General bed mobility comments: pt received standing with nurse tech, left sitting in recliner  Transfers Overall transfer level: Needs assistance Equipment used: None Transfers: Sit to/from Stand Sit to Stand: Supervision            Ambulation/Gait Ambulation/Gait assistance: Supervision Gait Distance  (Feet): 100 Feet Assistive device: None Gait Pattern/deviations: Step-through pattern Gait velocity: functional Gait velocity interpretation: 1.31 - 2.62 ft/sec, indicative of limited community ambulator General Gait Details: pt with steady step through gait, no significant balance deviations noted  Stairs            Wheelchair Mobility    Modified Rankin (Stroke Patients Only)       Balance Overall balance assessment: Needs assistance Sitting-balance support: No upper extremity supported;Feet supported Sitting balance-Leahy Scale: Good     Standing balance support: During functional activity;No upper extremity supported Standing balance-Leahy Scale: Good                               Pertinent Vitals/Pain Pain Assessment: Faces Faces Pain Scale: Hurts little more Pain Location: back Pain Descriptors / Indicators: Aching Pain Intervention(s): Monitored during session    Home Living Family/patient expects to be discharged to:: Private residence Living Arrangements: Spouse/significant other Available Help at Discharge: Family;Available 24 hours/day Type of Home: Other(Comment) (condo) Home Access: Elevator     Home Layout: One level Home Equipment: Walker - 2 wheels;Cane - single point;Wheelchair - manual;Shower seat - built in;Grab bars - toilet;Grab bars - tub/shower      Prior Function Level of Independence: Independent               Hand Dominance        Extremity/Trunk Assessment   Upper Extremity Assessment Upper Extremity Assessment: Overall WFL for tasks assessed    Lower Extremity Assessment Lower Extremity Assessment: Overall WFL for tasks assessed    Cervical / Trunk Assessment  Cervical / Trunk Assessment: Normal  Communication   Communication: No difficulties  Cognition Arousal/Alertness: Awake/alert Behavior During Therapy: WFL for tasks assessed/performed Overall Cognitive Status: Within Functional Limits for  tasks assessed                                        General Comments General comments (skin integrity, edema, etc.): Nurse tech recording orthostatic vital signs upon PT arrival, upon completion pt returns to sitting. PT records BP in sitting at 120/92. With standing BP drops to 88/68. After ambulating in room BP up to 96/71. Pt denies symptoms during session. PT provides education on need for time between positional changes as well as LE exercise prior to standing. PT encourages marching rather than standing still and use of compression stockings to reduce the risk of blood pooling in LEs.    Exercises     Assessment/Plan    PT Assessment Patient needs continued PT services  PT Problem List Decreased activity tolerance;Decreased balance;Decreased mobility;Pain;Decreased knowledge of precautions       PT Treatment Interventions Gait training;Functional mobility training;Therapeutic activities;Therapeutic exercise;Patient/family education    PT Goals (Current goals can be found in the Care Plan section)  Acute Rehab PT Goals Patient Stated Goal: To go home and stop passing out PT Goal Formulation: With patient Time For Goal Achievement: 04/04/20 Potential to Achieve Goals: Good    Frequency Min 3X/week   Barriers to discharge        Co-evaluation               AM-PAC PT "6 Clicks" Mobility  Outcome Measure Help needed turning from your back to your side while in a flat bed without using bedrails?: None Help needed moving from lying on your back to sitting on the side of a flat bed without using bedrails?: None Help needed moving to and from a bed to a chair (including a wheelchair)?: None Help needed standing up from a chair using your arms (e.g., wheelchair or bedside chair)?: None Help needed to walk in hospital room?: None Help needed climbing 3-5 steps with a railing? : A Little 6 Click Score: 23    End of Session Equipment Utilized During  Treatment: Gait belt Activity Tolerance: Patient tolerated treatment well Patient left: in chair;with call bell/phone within reach Nurse Communication: Mobility status PT Visit Diagnosis: Pain;Other abnormalities of gait and mobility (R26.89) Pain - part of body:  (back)    Time: 1425-1500 PT Time Calculation (min) (ACUTE ONLY): 35 min   Charges:   PT Evaluation $PT Eval Moderate Complexity: 1 Mod PT Treatments $Therapeutic Activity: 8-22 mins        Zenaida Niece, PT, DPT Acute Rehabilitation Pager: [phone]   Zenaida Niece 03/21/2020, 3:23 PM

## 2020-03-21 NOTE — Progress Notes (Signed)
Unable to complete orthostatic vitals because the patient has back pain and refuses to stand.

## 2020-03-21 NOTE — Progress Notes (Signed)
  Echocardiogram 2D Echocardiogram has been performed.  Clinton Black 03/21/2020, 9:11 AM

## 2020-03-22 DIAGNOSIS — E118 Type 2 diabetes mellitus with unspecified complications: Secondary | ICD-10-CM | POA: Diagnosis not present

## 2020-03-22 DIAGNOSIS — I10 Essential (primary) hypertension: Secondary | ICD-10-CM | POA: Diagnosis not present

## 2020-03-22 DIAGNOSIS — R55 Syncope and collapse: Secondary | ICD-10-CM | POA: Diagnosis not present

## 2020-03-22 LAB — GLUCOSE, CAPILLARY
Glucose-Capillary: 147 mg/dL — ABNORMAL HIGH (ref 70–99)
Glucose-Capillary: 122 mg/dL — ABNORMAL HIGH (ref 70–99)

## 2020-03-22 LAB — BASIC METABOLIC PANEL
Anion gap: 9 (ref 5–15)
BUN: 11 mg/dL (ref 8–23)
CO2: 22 mmol/L (ref 22–32)
Calcium: 8.8 mg/dL — ABNORMAL LOW (ref 8.9–10.3)
Chloride: 109 mmol/L (ref 98–111)
Creatinine, Ser: 0.87 mg/dL (ref 0.61–1.24)
GFR calc non Af Amer: >60 mL/min (ref >60)
Glucose, Bld: 114 mg/dL — ABNORMAL HIGH (ref 70–99)
Potassium: 3.8 mmol/L (ref 3.5–5.1)
Sodium: 140 mmol/L (ref 135–145)

## 2020-03-22 MED ORDER — OXYCODONE-ACETAMINOPHEN 5-325 MG PO TABS
1.0000 | ORAL_TABLET | Freq: Three times a day (TID) | ORAL | 0 refills | Status: AC | PRN
Start: 2020-03-22 — End: 2020-03-25

## 2020-03-22 MED ORDER — SPIRONOLACTONE 25 MG PO TABS: 12.5000 mg | ORAL_TABLET | Freq: Every day | ORAL | 1 refills | Status: DC

## 2020-03-22 MED ORDER — LOSARTAN POTASSIUM 25 MG PO TABS: 25.0000 mg | ORAL_TABLET | Freq: Two times a day (BID) | ORAL | Status: DC

## 2020-03-22 NOTE — Consult Note (Signed)
WOC Nurse Consult Note: Patient receiving care in [facility] Reason for Consult: Left lower extremity wound Wound type: Full thickness venous stasis wound Measurement: 1.5 cm x 1 cm Wound bed: Red Drainage (amount, consistency, odor) Bloody with no odor Dressing procedure/placement/frequency:  Apply Aquacel Kellie Simmering # [phone]) to LLE wound and cover with small foam dressing. Change daily.   Monitor the wound area(s) for worsening of condition such as: Signs/symptoms of infection, increase in size, development of or worsening of odor, development of pain, or increased pain at the affected locations.   Notify the medical team if any of these develop.  Thank you for the consult. [place] nurse will not follow at this time.   Please re-consult the [place] team if needed.  Cathlean Marseilles Tamala Julian, MSN, RN, [place], Lysle Pearl, [facility] Wound Treatment Associate Pager [phone]

## 2020-03-22 NOTE — Discharge Instructions (Signed)
Information on my medicine - XARELTO (Rivaroxaban)  This medication education was reviewed with me or my healthcare representative as part of my discharge preparation.  The pharmacist that spoke with me during my hospital stay was:  Onnie Boer, RPH-CPP  Why was Xarelto prescribed for you? Xarelto was prescribed for you to reduce the risk of a blood clot forming that can cause a stroke if you have a medical condition called atrial fibrillation (a type of irregular heartbeat).  What do you need to know about xarelto ? Take your Xarelto ONCE DAILY at the same time every day with your evening meal. If you have difficulty swallowing the tablet whole, you may crush it and mix in applesauce just prior to taking your dose.  Take Xarelto exactly as prescribed by your doctor and DO NOT stop taking Xarelto without talking to the doctor who prescribed the medication.  Stopping without other stroke prevention medication to take the place of Xarelto may increase your risk of developing a clot that causes a stroke.  Refill your prescription before you run out.  After discharge, you should have regular check-up appointments with your healthcare provider that is prescribing your Xarelto.  In the future your dose may need to be changed if your kidney function or weight changes by a significant amount.  What do you do if you miss a dose? If you are taking Xarelto ONCE DAILY and you miss a dose, take it as soon as you remember on the same day then continue your regularly scheduled once daily regimen the next day. Do not take two doses of Xarelto at the same time or on the same day.   Important Safety Information A possible side effect of Xarelto is bleeding. You should call your healthcare provider right away if you experience any of the following: ? Bleeding from an injury or your nose that does not stop. ? Unusual colored urine (red or dark brown) or unusual colored stools (red or black). ? Unusual  bruising for unknown reasons. ? A serious fall or if you hit your head (even if there is no bleeding).  Some medicines may interact with Xarelto and might increase your risk of bleeding while on Xarelto. To help avoid this, consult your healthcare provider or pharmacist prior to using any new prescription or non-prescription medications, including herbals, vitamins, non-steroidal anti-inflammatory drugs (NSAIDs) and supplements.  This website has more information on Xarelto: [email or web address]  No driving while taking pain meds-- will also need a bowel regimen while on pain meds

## 2020-03-22 NOTE — Discharge Summary (Signed)
Physician Discharge Summary  Clinton Black SNK:[id number] DOB: [birth year] DOA: 03/20/2020  PCP: [name], MD  Admit date: 03/20/2020 Discharge date: 03/22/2020  Admitted From: home Discharge disposition: home   Recommendations for Outpatient Follow-Up:   1. Compression stockings 2. BMP 1 week 3. Aldactone on hold for now as causing orthostatic hypotension 4. No driving 5. Card sending zio patch   Discharge Diagnosis:   Active Problems:   Type 2 diabetes mellitus with complication, without long-term current use of insulin (HCC)   Essential hypertension   Thoracic aortic aneurysm (HCC)   S/P ascending aortic replacement   Syncope and collapse    Discharge Condition: Improved.  Diet recommendation: Low sodium, heart healthy.  Carbohydrate-modified.  Regular.  Wound care: None.  Code status: Full.   History of Present Illness:   Clinton Black is a 74 y.o. male with medical history significant of thoracic aortic aneurysm status post AVR with bioprosthetic valve, aortic graft, and modified MA ZE with bilateral pulmonary vein isolation in May 2021; A. fib status post ablation with recurrence; diabetes; hypertension; hyperlipidemia; and BPH who presented following an episode of syncope and collapse.  He states he has had a degree of lightheadedness since his aortic procedure in May.  He has had intermittent symptoms since that time with an episode of syncope this past Thursday.  During that episode he was able to make it to his bed and did not hit his head.  He was taken to the ED at that time given a liter of fluids and was asymptomatic he was discharged with close follow-up with cardiology.  Cardiology suspected an episode of A. fib with RVR leading to his syncopal event and have ordered a 30-day monitor which she has not received yet.  Today, patient experienced another episode of syncope which was preceded by similar lightheadedness and clammy sensation.  He  fell and did hit his head.  No significant acute changes on imaging in ED.  He denies chest pain shortness of breath, abdominal pain, fever, nausea, diarrhea, sick contacts   Hospital Course by Problem:   Orthostatic hypotension -gentle IVF -TED hose -recheck in AM  Syncope and collapse History of thoracic aneurysm status post repair A. fib status post ablation with recurrence Recurrent syncope,previous episode last Thursday was seen in the ED given IV fluids and discharged with cardiology follow-up who have already ordered a 30-day monitor (suspicion was for breakthrough episode of A. fib with RVR triggering syncopal event). This time, did fall on a hard surface and hit his head, imaging negative. -continue tele -Continue metoprolol and Xarelto -Echocardiogram: Left ventricular ejection fraction, by estimation, is 55 to 60%. The  left ventricle has normal function. The left ventricle has no regional  wall motion abnormalities. There is mild left ventricular hypertrophy.  Left ventricular diastolic parameters  are consistent with Grade I diastolic dysfunction (impaired relaxation).   HTN -Continue losartan, metoprolol -hold spironolactone until follow up with PCP  DM -Continue Jardiance and Metformin  BPH -ContinueAlfuzosin  obesity Body mass index is 32.85 kg/m.    Medical Consultants:      Discharge Exam:   Vitals:   03/22/20 0856 03/22/20 1129  BP:  127/81  Pulse:  70  Resp:  18  Temp:  98.1 F (36.7 C)  SpO2: 95% 97%   Vitals:   03/22/20 0449 03/22/20 0720 03/22/20 0856 03/22/20 1129  BP: 136/76 114/85  127/81  Pulse: 64 70  70  Resp: 20 18  18   Temp: 97.8 F (36.6 C) 98 F (36.7 C)  98.1 F (36.7 C)  TempSrc: Oral Oral  Oral  SpO2: 94% 95% 95% 97%  Weight:      Height:        General exam: Appears calm and comfortable.   The results of significant diagnostics from this hospitalization (including imaging, microbiology, ancillary  and laboratory) are listed below for reference.     Procedures and Diagnostic Studies:   CT Head Wo Contrast  Result Date: 03/20/2020 CLINICAL DATA:  Dizziness, fall. EXAM: CT HEAD WITHOUT CONTRAST CT CERVICAL SPINE WITHOUT CONTRAST TECHNIQUE: Multidetector CT imaging of the head and cervical spine was performed following the standard protocol without intravenous contrast. Multiplanar CT image reconstructions of the cervical spine were also generated. COMPARISON:  None. FINDINGS: CT HEAD FINDINGS Brain: No evidence of acute infarction, hemorrhage, hydrocephalus, extra-axial collection or mass lesion/mass effect. Vascular: No hyperdense vessel or unexpected calcification. Skull: Normal. Negative for fracture or focal lesion. Sinuses/Orbits: No acute finding. Other: None. CT CERVICAL SPINE FINDINGS Alignment: Minimal grade 1 anterolisthesis of C4-5 is noted secondary to posterior facet joint hypertrophy. Skull base and vertebrae: No acute fracture. No primary bone lesion or focal pathologic process. Soft tissues and spinal canal: No prevertebral fluid or swelling. No visible canal hematoma. Disc levels: Severe degenerative disc disease is noted at C5-6 and C6-7 with anterior posterior osteophyte formation. Upper chest: Negative. Other: None. IMPRESSION: 1. Normal head CT. 2. Severe multilevel degenerative disc disease. No acute abnormality seen in the cervical spine. Electronically Signed   By: Marijo Conception M.D.   On: 03/20/2020 16:47   CT Cervical Spine Wo Contrast  Result Date: 03/20/2020 CLINICAL DATA:  Dizziness, fall. EXAM: CT HEAD WITHOUT CONTRAST CT CERVICAL SPINE WITHOUT CONTRAST TECHNIQUE: Multidetector CT imaging of the head and cervical spine was performed following the standard protocol without intravenous contrast. Multiplanar CT image reconstructions of the cervical spine were also generated. COMPARISON:  None. FINDINGS: CT HEAD FINDINGS Brain: No evidence of acute infarction, hemorrhage,  hydrocephalus, extra-axial collection or mass lesion/mass effect. Vascular: No hyperdense vessel or unexpected calcification. Skull: Normal. Negative for fracture or focal lesion. Sinuses/Orbits: No acute finding. Other: None. CT CERVICAL SPINE FINDINGS Alignment: Minimal grade 1 anterolisthesis of C4-5 is noted secondary to posterior facet joint hypertrophy. Skull base and vertebrae: No acute fracture. No primary bone lesion or focal pathologic process. Soft tissues and spinal canal: No prevertebral fluid or swelling. No visible canal hematoma. Disc levels: Severe degenerative disc disease is noted at C5-6 and C6-7 with anterior posterior osteophyte formation. Upper chest: Negative. Other: None. IMPRESSION: 1. Normal head CT. 2. Severe multilevel degenerative disc disease. No acute abnormality seen in the cervical spine. Electronically Signed   By: Marijo Conception M.D.   On: 03/20/2020 16:47   CT T-SPINE NO CHARGE  Result Date: 03/20/2020 CLINICAL DATA:  Single episode with fall, back pain EXAM: CT Thoracic Spine with contrast TECHNIQUE: Multiplanar CT images of the thoracic spine were reconstructed from contemporary CT of the Chest. CONTRAST:  No additional COMPARISON:  None FINDINGS: Alignment: Anteroposterior alignment is maintained. Vertebrae: Vertebral body heights are maintained apart from degenerative endplate irregularity. No acute fracture. Paraspinal and other soft tissues: Unremarkable. Extra-spinal findings are better evaluated on concurrent dedicated imaging. Disc levels: Multilevel disc space narrowing, small endplate osteophytes, and facet hypertrophy. No high-grade osseous encroachment on the spinal canal. IMPRESSION: No acute thoracic spine fracture. Electronically Signed  By: Macy Mis M.D.   On: 03/20/2020 16:44   DG Chest Port 1 View  Result Date: 03/20/2020 CLINICAL DATA:  Status post fall.  Hit is head. EXAM: PORTABLE CHEST 1 VIEW COMPARISON:  12/28/2019 FINDINGS: No focal  consolidation. No pleural effusion or pneumothorax. Heart and mediastinal contours are unremarkable. Prior median sternotomy. No acute osseous abnormality. IMPRESSION: No active disease. Electronically Signed   By: Kathreen Devoid   On: 03/20/2020 14:35   ECHOCARDIOGRAM COMPLETE  Result Date: 03/21/2020    ECHOCARDIOGRAM REPORT   Patient Name:   Clinton Black Date of Exam: 03/21/2020 Medical Rec #:  [id number]       Height:       77.0 in Accession #:    [id number]      Weight:       277.0 lb Date of Birth:  1945-09-17      BSA:          2.569 m Patient Age:    76 years        BP:           130/79 mmHg Patient Gender: M               HR:           70 bpm. Exam Location:  Inpatient Procedure: 2D Echo, Cardiac Doppler, Color Doppler and Intracardiac            Opacification Agent Indications:     [place]  History:         Patient has prior history of Echocardiogram examinations, most                  recent 12/01/2019. Aortic Valve Disease, Arrythmias:Atrial                  Fibrillation, Signs/Symptoms:Syncope; Risk                  Factors:Hypertension, Diabetes, Dyslipidemia and Obesity. MAZE                  procedure.                  Aortic Valve: 25 mm Edwards bioprosthetic valve is present in                  the aortic position.  Sonographer:     Dustin Flock Referring Phys:  [id number] [place] Diagnosing Phys: Loralie Champagne MD IMPRESSIONS  1. Left ventricular ejection fraction, by estimation, is 55 to 60%. The left ventricle has normal function. The left ventricle has no regional wall motion abnormalities. There is mild left ventricular hypertrophy. Left ventricular diastolic parameters are consistent with Grade I diastolic dysfunction (impaired relaxation).  2. Right ventricular systolic function is mildly reduced. The right ventricular size is mildly enlarged. Tricuspid regurgitation signal is inadequate for assessing PA pressure. Mildly D-shaped interventricular septum suggests a degree of RV  pressure/volume overload.  3. Right atrial size was mildly dilated.  4. The mitral valve is normal in structure. No evidence of mitral valve regurgitation. No evidence of mitral stenosis.  5. There is a bioprosthetic aortic valve. No significant regurgitation or stenosis.  6. The inferior vena cava is normal in size with greater than 50% respiratory variability, suggesting right atrial pressure of 3 mmHg. FINDINGS  Left Ventricle: Left ventricular ejection fraction, by estimation, is 55 to 60%. The left ventricle has normal function. The left ventricle has no regional wall  motion abnormalities. Definity contrast agent was given IV to delineate the left ventricular  endocardial borders. The left ventricular internal cavity size was normal in size. There is mild left ventricular hypertrophy. Left ventricular diastolic parameters are consistent with Grade I diastolic dysfunction (impaired relaxation). Right Ventricle: The right ventricular size is mildly enlarged. No increase in right ventricular wall thickness. Right ventricular systolic function is mildly reduced. Tricuspid regurgitation signal is inadequate for assessing PA pressure. Left Atrium: Left atrial size was normal in size. Right Atrium: Right atrial size was mildly dilated. Pericardium: There is no evidence of pericardial effusion. Mitral Valve: The mitral valve is normal in structure. No evidence of mitral valve regurgitation. No evidence of mitral valve stenosis. Tricuspid Valve: The tricuspid valve is normal in structure. Tricuspid valve regurgitation is not demonstrated. Aortic Valve: There is a bioprosthetic aortic valve. No significant regurgitation or stenosis. The aortic valve has been repaired/replaced. Aortic valve regurgitation is not visualized. No aortic stenosis is present. There is a 25 mm Edwards bioprosthetic valve present in the aortic position. Pulmonic Valve: The pulmonic valve was normal in structure. Pulmonic valve regurgitation is  not visualized. Aorta: The aortic root is normal in size and structure. Venous: The inferior vena cava is normal in size with greater than 50% respiratory variability, suggesting right atrial pressure of 3 mmHg. IAS/Shunts: No atrial level shunt detected by color flow Doppler.  LEFT VENTRICLE PLAX 2D LVIDd:         4.62 cm  Diastology LVIDs:         3.58 cm  LV e' medial:    6.85 cm/s LV PW:         1.56 cm  LV E/e' medial:  7.4 LV IVS:        1.50 cm  LV e' lateral:   7.51 cm/s LVOT diam:     2.30 cm  LV E/e' lateral: 6.8 LV SV:         105 LV SV Index:   41 LVOT Area:     4.15 cm  RIGHT VENTRICLE RV Basal diam:  4.43 cm RV S prime:     7.40 cm/s TAPSE (M-mode): 3.6 cm LEFT ATRIUM             Index       RIGHT ATRIUM           Index LA diam:        3.60 cm 1.40 cm/m  RA Area:     19.60 cm LA Vol (A2C):   41.9 ml 16.31 ml/m RA Volume:   60.80 ml  23.67 ml/m LA Vol (A4C):   69.3 ml 26.98 ml/m LA Biplane Vol: 57.6 ml 22.42 ml/m  AORTIC VALVE LVOT Vmax:   122.00 cm/s LVOT Vmean:  76.300 cm/s LVOT VTI:    0.252 m  AORTA Ao Root diam: 3.20 cm MITRAL VALVE MV Area (PHT): 2.50 cm    SHUNTS MV Decel Time: 303 msec    Systemic VTI:  0.25 m MV E velocity: 51.00 cm/s  Systemic Diam: 2.30 cm MV A velocity: 60.80 cm/s MV E/A ratio:  0.84 Loralie Champagne MD Electronically signed by Loralie Champagne MD Signature Date/Time: 03/21/2020/2:59:22 PM    Final (Updated)    CT Angio Chest/Abd/Pel for Dissection W and/or Wo Contrast  Result Date: 03/20/2020 CLINICAL DATA:  Recent syncopal episode and history of abdominal pain and chest pain with fall, initial encounter EXAM: CT ANGIOGRAPHY CHEST, ABDOMEN AND PELVIS TECHNIQUE: Non-contrast CT of the  chest was initially obtained. Multidetector CT imaging through the chest, abdomen and pelvis was performed using the standard protocol during bolus administration of intravenous contrast. Multiplanar reconstructed images and MIPs were obtained and reviewed to evaluate the vascular anatomy.  CONTRAST:  133mL OMNIPAQUE IOHEXOL 350 MG/ML SOLN COMPARISON:  09/14/2019, 02/21/2015 FINDINGS: CTA CHEST FINDINGS Cardiovascular: Initial precontrast images demonstrate atherosclerotic calcification of the thoracic aorta. Aortic valve replacement is noted. No hyperdense crescent is identified to suggest acute aortic injury. There are changes consistent with ascending aortic repair in addition to the aortic valve replacement. No dissection is seen. Aneurysmal dilatation of the descending thoracic aorta is noted measuring 4.4 cm. No significant tapering is noted distally. Coronary calcifications are seen. Pulmonary artery shows no large central embolus although timing was not performed for pulmonary embolus evaluation. Mediastinum/Nodes: Thoracic inlet is within normal limits. No hilar or mediastinal adenopathy is noted. The esophagus as visualized shows mild distal wall thickening similar to that seen on the prior exam likely related to a degree of reflux. Lungs/Pleura: Lungs are well aerated bilaterally. No focal infiltrate or sizable effusion is noted. Mild emphysematous changes are seen. 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 This is stable from the prior exam. Degenerative changes of the thoracic spine are noted. No compression deformities are seen. Review of the MIP images confirms the above findings. CTA ABDOMEN AND PELVIS FINDINGS VASCULAR Aorta: Abdominal aorta demonstrates atherosclerotic calcifications. No evidence of aneurysmal dilatation is seen. Celiac: Mild narrowing of the celiac axis is noted proximally likely related to median arcuate compression. Minimal poststenotic dilatation is seen. SMA: Patent without evidence of aneurysm, dissection, vasculitis or significant stenosis. Renals: Single renal arteries are identified bilaterally. IMA: Patent without evidence of aneurysm, dissection, vasculitis or significant stenosis. Iliacs: Mild atherosclerotic calcifications are noted. No  aneurysmal dilatation or dissection is noted. Veins: No specific venous abnormality is seen. Review of the MIP images confirms the above findings. NON-VASCULAR Hepatobiliary: No focal liver abnormality is seen. No gallstones, gallbladder wall thickening, or biliary dilatation. Pancreas: Unremarkable. No pancreatic ductal dilatation or surrounding inflammatory changes. Spleen: Normal in size without focal abnormality. Adrenals/Urinary Tract: Adrenal glands are within normal limits. Kidneys are well visualized bilaterally. No renal calculi or obstructive changes are seen. Large right renal cyst is noted which measures approximately 12 cm in greatest dimension. This is similar to that seen on the prior exam. A small exophytic area of calcification is noted along its superior margin laterally similar to that noted on previous exams. No obstructive changes are noted. The bladder is well distended. Changes consistent with urolift are noted. Stomach/Bowel: Colon shows scattered diverticular change without evidence of diverticulitis. No obstructive or inflammatory changes are noted. The appendix is within normal limits. No small bowel abnormality is seen. No gastric abnormality is noted. Lymphatic: No significant lymphadenopathy is noted. Reproductive: Prostate is unremarkable with the exception of uro lift device is superiorly. Other: No abdominal wall hernia or abnormality. No abdominopelvic ascites. Musculoskeletal: No acute or significant osseous findings. Review of the MIP images confirms the above findings. IMPRESSION: No evidence of aortic dissection. There are changes consistent with aortic valve repair and replacement of the ascending aorta. Stable dilatation of the descending aorta is noted. Changes suggestive of distal esophageal reflux. Chronic changes in the abdomen and pelvis similar to that seen on prior exams. Electronically Signed   By: Inez Catalina M.D.   On: 03/20/2020 16:51     Labs:   Basic  Metabolic Panel: Recent Labs  Lab 03/20/20 1418 03/20/20 1418 03/20/20 1436 03/20/20 1436 03/21/20 0303 03/22/20 0302  NA 140  --  141  --  141 140  K 4.2   < > 4.1   < > 3.6 3.8  CL 107  --  108  --  108 109  CO2 21*  --   --   --  23 22  GLUCOSE 120*  --  114*  --  114* 114*  BUN 15  --  18  --  14 11  CREATININE 1.01  --  1.00  --  0.95 0.87  CALCIUM 8.9  --   --   --  9.0 8.8*   < > = values in this interval not displayed.   GFR Estimated Creatinine Clearance: 113.6 mL/min (by C-G formula based on SCr of 0.87 mg/dL). Liver Function Tests: Recent Labs  Lab 03/20/20 1418  AST 17  ALT 17  ALKPHOS 84  BILITOT 0.9  PROT 6.2*  ALBUMIN 4.0   No results for input(s): LIPASE, AMYLASE in the last 168 hours. No results for input(s): AMMONIA in the last 168 hours. Coagulation profile Recent Labs  Lab 03/20/20 1418  INR 1.5*    CBC: Recent Labs  Lab 03/20/20 1418 03/20/20 1436 03/21/20 0303  WBC 8.8  --  9.4  NEUTROABS 6.4  --   --   HGB 14.1 14.3 13.6  HCT 43.3 42.0 43.8  MCV 86.4  --  88.5  PLT 199  --  196   Cardiac Enzymes: No results for input(s): CKTOTAL, CKMB, CKMBINDEX, TROPONINI in the last 168 hours. BNP: Invalid input(s): POCBNP CBG: Recent Labs  Lab 03/21/20 0610 03/21/20 1603 03/21/20 2029 03/22/20 0545 03/22/20 1128  GLUCAP 229* 184* 155* 147* 122*   D-Dimer No results for input(s): DDIMER in the last 72 hours. Hgb A1c Recent Labs    03/21/20 1722  HGBA1C 6.5*   Lipid Profile No results for input(s): CHOL, HDL, LDLCALC, TRIG, CHOLHDL, LDLDIRECT in the last 72 hours. Thyroid function studies No results for input(s): TSH, T4TOTAL, T3FREE, THYROIDAB in the last 72 hours.  Invalid input(s): FREET3 Anemia work up No results for input(s): VITAMINB12, FOLATE, FERRITIN, TIBC, IRON, RETICCTPCT in the last 72 hours. Microbiology Recent Results (from the past 240 hour(s))  Respiratory Panel by RT PCR (Flu A&B, Covid) - Nasopharyngeal  Swab     Status: None   Collection Time: 03/21/20  2:40 AM   Specimen: Nasopharyngeal Swab  Result Value Ref Range Status   SARS Coronavirus 2 by RT PCR NEGATIVE NEGATIVE Final    Comment: (NOTE) SARS-CoV-2 target nucleic acids are NOT DETECTED.  The SARS-CoV-2 RNA is generally detectable in upper respiratoy specimens during the acute phase of infection. The lowest concentration of SARS-CoV-2 viral copies this assay can detect is 131 copies/mL. A negative result does not preclude SARS-Cov-2 infection and should not be used as the sole basis for treatment or other patient management decisions. A negative result may occur with  improper specimen collection/handling, submission of specimen other than nasopharyngeal swab, presence of viral mutation(s) within the areas targeted by this assay, and inadequate number of viral copies (<131 copies/mL). A negative result must be combined with clinical observations, patient history, and epidemiological information. The expected result is Negative.  Fact Sheet for Patients:  PinkCheek.be  Fact Sheet for Healthcare Providers:  GravelBags.it  This test is no t yet approved or cleared by the [place] FDA and  has been authorized for detection and/or  diagnosis of SARS-CoV-2 by FDA under an Emergency Use Authorization (EUA). This EUA will remain  in effect (meaning this test can be used) for the duration of the COVID-19 declaration under Section 564(b)(1) of the Act, 21 U.S.C. section 360bbb-3(b)(1), unless the authorization is terminated or revoked sooner.     Influenza A by PCR NEGATIVE NEGATIVE Final   Influenza B by PCR NEGATIVE NEGATIVE Final    Comment: (NOTE) The Xpert Xpress SARS-CoV-2/FLU/RSV assay is intended as an aid in  the diagnosis of influenza from Nasopharyngeal swab specimens and  should not be used as a sole basis for treatment. Nasal washings and  aspirates are  unacceptable for Xpert Xpress SARS-CoV-2/FLU/RSV  testing.  Fact Sheet for Patients: PinkCheek.be  Fact Sheet for Healthcare Providers: GravelBags.it  This test is not yet approved or cleared by the [place] FDA and  has been authorized for detection and/or diagnosis of SARS-CoV-2 by  FDA under an Emergency Use Authorization (EUA). This EUA will remain  in effect (meaning this test can be used) for the duration of the  Covid-19 declaration under Section 564(b)(1) of the Act, 21  U.S.C. section 360bbb-3(b)(1), unless the authorization is  terminated or revoked. Performed at [facility], [place] [address], [place], [place] [address]      Discharge Instructions:   Discharge Instructions    Diet - low sodium heart healthy   Complete by: As directed    Diet Carb Modified   Complete by: As directed    Discharge instructions   Complete by: As directed    TED/compression hose BMP 1 week Hold aldactone until follow up with PCP for labs/BP check   Discharge wound care:   Complete by: As directed    Apply Aquacel to LLE wound and cover with small foam dressing. Change daily.   Increase activity slowly   Complete by: As directed      Allergies as of 03/22/2020      Reactions   Quinolones Other (See Comments)   Patient was warned about not using Cipro and similar antibiotics. Recent studies have raised concern that fluoroquinolone antibiotics could be associated with an increased risk of aortic aneurysm Fluoroquinolones have non-antimicrobial properties that might jeopardise the integrity of the extracellular matrix of the vascular wall In a  propensity score matched cohort study in [place], there was a 66% increased rate of aortic aneurysm or dissection associated with oral fluoroquinolone use, compared wit      Medication List    TAKE these medications   alfuzosin 10 MG 24 hr tablet Commonly known as:  UROXATRAL Take 1 tablet (10 mg total) by mouth daily with breakfast.   atorvastatin 20 MG tablet Commonly known as: LIPITOR TAKE 1 TABLET BY MOUTH EVERYDAY AT BEDTIME What changed:   how much to take  how to take this  when to take this  additional instructions   losartan 25 MG tablet Commonly known as: COZAAR Take 1 tablet (25 mg total) by mouth 2 (two) times daily. What changed: how much to take   metoprolol tartrate 25 MG tablet Commonly known as: LOPRESSOR Take 0.5 tablets (12.5 mg total) by mouth 2 (two) times daily.   SALONPAS PAIN RELIEF PATCH EX Apply 1 patch topically daily as needed (back pain).   spironolactone 25 MG tablet Commonly known as: ALDACTONE Take 0.5 tablets (12.5 mg total) by mouth at bedtime. Start taking on: March 29, 2020 What changed: These instructions start on March 29, 2020. If  you are unsure what to do until then, ask your doctor or other care provider.   Synjardy XR 25-1000 MG Tb24 Generic drug: Empagliflozin-metFORMIN HCl ER Take 1 tablet by mouth daily.   triazolam 0.25 MG tablet Commonly known as: HALCION TAKE 1 TABLET (0.25 MG TOTAL) BY MOUTH AT BEDTIME AS NEEDED FOR SLEEP.   Xarelto 20 MG Tabs tablet Generic drug: rivaroxaban TAKE 1 TABLET EVERY DAY WITH SUPPER What changed: See the new instructions.            Discharge Care Instructions  (From admission, onward)         Start     Ordered   03/22/20 0000  Discharge wound care:       Comments: Apply Aquacel to LLE wound and cover with small foam dressing. Change daily.   03/22/20 1250          Follow-up Information    [name], MD Follow up in 1 week(s).   Specialty: Internal Medicine Contact information: [place] [address] [phone]        [name], [name], MD .   Specialty: Cardiology Contact information: [address] [place] [address] [phone]                Time coordinating  discharge: 25 min  Signed:  Geradine Girt DO  [facility] 03/22/2020, 12:50 PM

## 2020-03-22 NOTE — Progress Notes (Signed)
   03/22/20 0856  Vitals  Patient Position (if appropriate) Orthostatic Vitals  Orthostatic Lying   BP- Lying 111/68  Pulse- Lying 63  Orthostatic Sitting  BP- Sitting 114/89  Pulse- Sitting 80  Orthostatic Standing at 0 minutes  BP- Standing at 0 minutes 97/73  Pulse- Standing at 0 minutes 93  Orthostatic Standing at 3 minutes  BP- Standing at 3 minutes 114/79  Pulse- Standing at 3 minutes 101  Oxygen Therapy  SpO2 95 %  O2 Device Room Air  Patient Activity (if Appropriate) In chair

## 2020-03-22 NOTE — Progress Notes (Signed)
D/C instructions given and reviewed. No questions asked but encouraged to call with concerns.

## 2020-03-22 NOTE — Progress Notes (Signed)
Pt refused sliding scale insulin at this time. Will page MD to make aware. Education given. Patient verbalized understanding of importance. Patient still refused.

## 2020-03-22 NOTE — Progress Notes (Signed)
Physical Therapy Treatment Patient Details Name: Clinton Black MRN: [id number] DOB: 1946/01/06 Today's Date: 03/22/2020    History of Present Illness 74 y.o. male with medical history significant of thoracic aortic aneurysm status post AVR with bioprosthetic valve, aortic graft, and modified MA ZE with bilateral pulmonary vein isolation in May 2021; A. fib status post ablation with recurrence; diabetes; hypertension; hyperlipidemia; and BPH who presented following an episode of syncope and collapse. Pt reports symptoms of lightheadedness since aortic procedure in May. Pt with 2 recent syncopal episodes, most recent on 10/6 resulting in fall.    PT Comments    Patient reports feeling tired due to lack of sleep in the hospital. Tolerated bed mobility, transfers and ambulation with supervision for safety. Noted to have guarded gait pattern due to back pain from fall. Encouraged increasing activity to help relieve stiffness/pain in back since fall. Orthostatic vitals performed. Supine BP 111/68 Sitting BP 114/89 Standing BP 97/73 Standing after 3 mins 114/79 No dizziness reported throughout changes in position however pt reporting back pain/discomfort. Orthostatics not technically positive but still noted to have a drop with standing. Will continue to follow.   Follow Up Recommendations  No PT follow up;Supervision - Intermittent     Equipment Recommendations  None recommended by PT    Recommendations for Other Services       Precautions / Restrictions Precautions Precautions: Fall Precaution Comments: monitor BP Restrictions Weight Bearing Restrictions: No    Mobility  Bed Mobility Overal bed mobility: Needs Assistance Bed Mobility: Supine to Sit     Supine to sit: Supervision;HOB elevated     General bed mobility comments: Increased time and effort to get to EOB, stiffness in back.  Transfers Overall transfer level: Needs assistance Equipment used: None Transfers:  Sit to/from Stand Sit to Stand: Supervision         General transfer comment: Supervision for safety. Stood from elevated bed height x1, transferred to chair post ambulation.  Ambulation/Gait Ambulation/Gait assistance: Supervision Gait Distance (Feet): 40 Feet Assistive device: None Gait Pattern/deviations: Step-through pattern;Decreased stride length;Wide base of support Gait velocity: decreased   General Gait Details: Slow, waddling like, stiff and guarded gait pattern due to back pain; no evidence of imbalance. No dizziness.   Stairs             Wheelchair Mobility    Modified Rankin (Stroke Patients Only)       Balance Overall balance assessment: Needs assistance Sitting-balance support: Feet supported;No upper extremity supported Sitting balance-Leahy Scale: Good     Standing balance support: During functional activity Standing balance-Leahy Scale: Good Standing balance comment: Able to march in place during orthostatics without support                            Cognition Arousal/Alertness: Awake/alert Behavior During Therapy: WFL for tasks assessed/performed Overall Cognitive Status: Within Functional Limits for tasks assessed                                        Exercises      General Comments General comments (skin integrity, edema, etc.): Supine BP 111/68, sitting BP 114/89, standing BP 97/73, Standing ~3 mins 114/79, no dizziness throughout      Pertinent Vitals/Pain Pain Assessment: Faces Faces Pain Scale: Hurts whole lot Pain Location: back Pain Descriptors / Indicators: Aching;Grimacing;Guarding;Sore Pain Intervention(s):  Monitored during session;Repositioned;Limited activity within patient's tolerance    Home Living                      Prior Function            PT Goals (current goals can now be found in the care plan section) Progress towards PT goals: Not progressing toward goals - comment  (due to pain)    Frequency    Min 3X/week      PT Plan Current plan remains appropriate    Co-evaluation              AM-PAC PT "6 Clicks" Mobility   Outcome Measure  Help needed turning from your back to your side while in a flat bed without using bedrails?: None Help needed moving from lying on your back to sitting on the side of a flat bed without using bedrails?: None Help needed moving to and from a bed to a chair (including a wheelchair)?: None Help needed standing up from a chair using your arms (e.g., wheelchair or bedside chair)?: None Help needed to walk in hospital room?: None Help needed climbing 3-5 steps with a railing? : A Little 6 Click Score: 23    End of Session   Activity Tolerance: Patient tolerated treatment well Patient left: in chair;with bed alarm set;with nursing/sitter in room Nurse Communication: Mobility status PT Visit Diagnosis: Pain;Other abnormalities of gait and mobility (R26.89) Pain - part of body:  (back)     Time: 2902-1115 PT Time Calculation (min) (ACUTE ONLY): 19 min  Charges:  $Therapeutic Activity: 8-22 mins                     Marisa Severin, PT, DPT Acute Rehabilitation Services Pager [phone] Office [place] 03/22/2020, 8:59 AM

## 2020-03-28 ENCOUNTER — Encounter: Payer: Self-pay | Admitting: Internal Medicine

## 2020-03-28 ENCOUNTER — Ambulatory Visit (INDEPENDENT_AMBULATORY_CARE_PROVIDER_SITE_OTHER): Payer: Medicare HMO | Admitting: Internal Medicine

## 2020-03-28 ENCOUNTER — Other Ambulatory Visit: Payer: Self-pay

## 2020-03-28 VITALS — BP 128/86 | HR 59 | Temp 97.5°F | Resp 16 | Ht 77.0 in | Wt 285.0 lb

## 2020-03-28 DIAGNOSIS — N400 Enlarged prostate without lower urinary tract symptoms: Secondary | ICD-10-CM

## 2020-03-28 DIAGNOSIS — R972 Elevated prostate specific antigen [PSA]: Secondary | ICD-10-CM | POA: Diagnosis not present

## 2020-03-28 DIAGNOSIS — I4819 Other persistent atrial fibrillation: Secondary | ICD-10-CM

## 2020-03-28 DIAGNOSIS — I5032 Chronic diastolic (congestive) heart failure: Secondary | ICD-10-CM | POA: Diagnosis not present

## 2020-03-28 DIAGNOSIS — E118 Type 2 diabetes mellitus with unspecified complications: Secondary | ICD-10-CM | POA: Diagnosis not present

## 2020-03-28 DIAGNOSIS — M5136 Other intervertebral disc degeneration, lumbar region: Secondary | ICD-10-CM | POA: Diagnosis not present

## 2020-03-28 DIAGNOSIS — M5030 Other cervical disc degeneration, unspecified cervical region: Secondary | ICD-10-CM

## 2020-03-28 DIAGNOSIS — I10 Essential (primary) hypertension: Secondary | ICD-10-CM | POA: Diagnosis not present

## 2020-03-28 MED ORDER — OXYCODONE HCL 5 MG PO TABS: 5.0000 mg | ORAL_TABLET | Freq: Four times a day (QID) | ORAL | 0 refills | Status: AC | PRN

## 2020-03-28 NOTE — Patient Instructions (Signed)
Type 2 Diabetes Mellitus, Diagnosis, Adult Type 2 diabetes (type 2 diabetes mellitus) is a long-term (chronic) disease. In type 2 diabetes, one or both of these problems may be present:  The pancreas does not make enough of a hormone called insulin.  Cells in the body do not respond properly to insulin that the body makes (insulin resistance). Normally, insulin allows blood sugar (glucose) to enter cells in the body. The cells use glucose for energy. Insulin resistance or lack of insulin causes excess glucose to build up in the blood instead of going into cells. As a result, high blood glucose (hyperglycemia) develops. What increases the risk? The following factors may make you more likely to develop type 2 diabetes:  Having a family member with type 2 diabetes.  Being overweight or obese.  Having an inactive (sedentary) lifestyle.  Having been diagnosed with insulin resistance.  Having a history of prediabetes, gestational diabetes, or polycystic ovary syndrome (PCOS).  Being of American-Indian, African-American, Hispanic/Latino, or Asian/Pacific Islander descent. What are the signs or symptoms? In the early stage of this condition, you may not have symptoms. Symptoms develop slowly and may include:  Increased thirst (polydipsia).  Increased hunger(polyphagia).  Increased urination (polyuria).  Increased urination during the night (nocturia).  Unexplained weight loss.  Frequent infections that keep coming back (recurring).  Fatigue.  Weakness.  Vision changes, such as blurry vision.  Cuts or bruises that are slow to heal.  Tingling or numbness in the hands or feet.  Dark patches on the skin (acanthosis nigricans). How is this diagnosed? This condition is diagnosed based on your symptoms, your medical history, a physical exam, and your blood glucose level. Your blood glucose may be checked with one or more of the following blood tests:  A fasting blood glucose (FBG)  test. You will not be allowed to eat (you will fast) for 8 hours or longer before a blood sample is taken.  A random blood glucose test. This test checks blood glucose at any time of day regardless of when you ate.  An A1c (hemoglobin A1c) blood test. This test provides information about blood glucose control over the previous 2-3 months.  An oral glucose tolerance test (OGTT). This test measures your blood glucose at two times: ? After fasting. This is your baseline blood glucose level. ? Two hours after drinking a beverage that contains glucose. You may be diagnosed with type 2 diabetes if:  Your FBG level is 126 mg/dL (7.0 mmol/L) or higher.  Your random blood glucose level is 200 mg/dL (11.1 mmol/L) or higher.  Your A1c level is 6.5% or higher.  Your OGTT result is higher than 200 mg/dL (11.1 mmol/L). These blood tests may be repeated to confirm your diagnosis. How is this treated? Your treatment may be managed by a specialist called an endocrinologist. Type 2 diabetes may be treated by following instructions from your health care provider about:  Making diet and lifestyle changes. This may include: ? Following an individualized nutrition plan that is developed by a diet and nutrition specialist (registered dietitian). ? Exercising regularly. ? Finding ways to manage stress.  Checking your blood glucose level as often as told.  Taking diabetes medicines or insulin daily. This helps to keep your blood glucose levels in the healthy range. ? If you use insulin, you may need to adjust the dosage depending on how physically active you are and what foods you eat. Your health care provider will tell you how to adjust your dosage.    Taking medicines to help prevent complications from diabetes, such as: ? Aspirin. ? Medicine to lower cholesterol. ? Medicine to control blood pressure. Your health care provider will set individualized treatment goals for you. Your goals will be based on  your age, other medical conditions you have, and how you respond to diabetes treatment. Generally, the goal of treatment is to maintain the following blood glucose levels:  Before meals (preprandial): 80-130 mg/dL (4.4-7.2 mmol/L).  After meals (postprandial): below 180 mg/dL (10 mmol/L).  A1c level: less than 7%. Follow these instructions at home: Questions to ask your health care provider  Consider asking the following questions: ? Do I need to meet with a diabetes educator? ? Where can I find a support group for people with diabetes? ? What equipment will I need to manage my diabetes at home? ? What diabetes medicines do I need, and when should I take them? ? How often do I need to check my blood glucose? ? What number can I call if I have questions? ? When is my next appointment? General instructions  Take over-the-counter and prescription medicines only as told by your health care provider.  Keep all follow-up visits as told by your health care provider. This is important.  For more information about diabetes, visit: ? American Diabetes Association (ADA): www.diabetes.org ? American Association of Diabetes Educators (AADE): www.diabeteseducator.org Contact a health care provider if:  Your blood glucose is at or above 240 mg/dL (13.3 mmol/L) for 2 days in a row.  You have been sick or have had a fever for 2 days or longer, and you are not getting better.  You have any of the following problems for more than 6 hours: ? You cannot eat or drink. ? You have nausea and vomiting. ? You have diarrhea. Get help right away if:  Your blood glucose is lower than 54 mg/dL (3.0 mmol/L).  You become confused or you have trouble thinking clearly.  You have difficulty breathing.  You have moderate or large ketone levels in your urine. Summary  Type 2 diabetes (type 2 diabetes mellitus) is a long-term (chronic) disease. In type 2 diabetes, the pancreas does not make enough of a  hormone called insulin, or cells in the body do not respond properly to insulin that the body makes (insulin resistance).  This condition is treated by making diet and lifestyle changes and taking diabetes medicines or insulin.  Your health care provider will set individualized treatment goals for you. Your goals will be based on your age, other medical conditions you have, and how you respond to diabetes treatment.  Keep all follow-up visits as told by your health care provider. This is important. This information is not intended to replace advice given to you by your health care provider. Make sure you discuss any questions you have with your health care provider. Document Revised: 07/30/2017 Document Reviewed: 07/05/2015 Elsevier Patient Education  2020 Elsevier Inc.  

## 2020-03-28 NOTE — Progress Notes (Signed)
Subjective:  Patient ID: Clinton Black, male    DOB: 1945/09/24  Age: 74 y.o. MRN: [id number]  CC: Diabetes, Hypertension, and Atrial Fibrillation  This visit occurred during the SARS-CoV-2 public health emergency.  Safety protocols were in place, including screening questions prior to the visit, additional usage of staff PPE, and extensive cleaning of exam room while observing appropriate contact time as indicated for disinfecting solutions.    HPI [name] presents for f/up - He was recently admitted for a syncopal episode and falls. CT scans show degenerative changes in the cervical and lumbar spine.  He was prescribed oxycodone for the pain. He tells me if he does not take oxycodone he cannot do his ADLs and cannot get any rest. He requests a refill of oxycodone. He tells me he is having swings in his blood pressure and recently saw a PA in cardiology and his antihypertensives are being managed and he is scheduled to undergo a heart monitor. He has had no more syncopal episodes. He feels mildly off balance. He denies chest pain, shortness of breath, edema, or palpitations.  PCP: [name], MD  Admit date: 03/20/2020 Discharge date: 03/22/2020  Admitted From: home Discharge disposition: home   Recommendations for Outpatient Follow-Up:   1. Compression stockings 2. BMP 1 week 3. Aldactone on hold for now as causing orthostatic hypotension 4. No driving 5. Card sending zio patch   Discharge Diagnosis:   Active Problems:   Type 2 diabetes mellitus with complication, without long-term current use of insulin (HCC)   Essential hypertension   Thoracic aortic aneurysm (HCC)   S/P ascending aortic replacement   Syncope and collapse    Outpatient Medications Prior to Visit  Medication Sig Dispense Refill  . alfuzosin (UROXATRAL) 10 MG 24 hr tablet Take 1 tablet (10 mg total) by mouth daily with breakfast. 90 tablet 1  . atorvastatin (LIPITOR) 20 MG tablet  TAKE 1 TABLET BY MOUTH EVERYDAY AT BEDTIME (Patient taking differently: Take 20 mg by mouth at bedtime. ) 90 tablet 1  . Empagliflozin-metFORMIN HCl ER (SYNJARDY XR) 25-1000 MG TB24 Take 1 tablet by mouth daily. 90 tablet 1  . losartan (COZAAR) 25 MG tablet Take 1 tablet (25 mg total) by mouth 2 (two) times daily.    . Menthol-Methyl Salicylate (SALONPAS PAIN RELIEF PATCH EX) Apply 1 patch topically daily as needed (back pain).     . metoprolol tartrate (LOPRESSOR) 25 MG tablet Take 0.5 tablets (12.5 mg total) by mouth 2 (two) times daily. 90 tablet 1  . spironolactone (ALDACTONE) 25 MG tablet Take 0.5 tablets (12.5 mg total) by mouth at bedtime. 45 tablet 1  . triazolam (HALCION) 0.25 MG tablet TAKE 1 TABLET (0.25 MG TOTAL) BY MOUTH AT BEDTIME AS NEEDED FOR SLEEP. 30 tablet 3  . XARELTO 20 MG TABS tablet TAKE 1 TABLET EVERY DAY WITH SUPPER (Patient taking differently: Take 20 mg by mouth daily with supper. ) 90 tablet 1   No facility-administered medications prior to visit.    ROS Review of Systems  Constitutional: Positive for fatigue. Negative for appetite change, chills, diaphoresis and fever.  HENT: Negative.   Eyes: Negative.   Respiratory: Negative for cough, chest tightness and shortness of breath.   Cardiovascular: Negative for chest pain and leg swelling.  Gastrointestinal: Negative for abdominal pain, constipation, diarrhea, nausea and vomiting.  Endocrine: Negative.   Genitourinary: Negative.  Negative for difficulty urinating and dysuria.  Musculoskeletal: Positive for arthralgias, back pain  and neck pain. Negative for myalgias.  Neurological: Positive for syncope. Negative for dizziness, weakness, light-headedness and numbness.  Hematological: Negative for adenopathy. Does not bruise/bleed easily.  Psychiatric/Behavioral: Positive for sleep disturbance. Negative for confusion, decreased concentration and dysphoric mood. The patient is not nervous/anxious.     Objective:  BP  128/86   Pulse (!) 59   Temp (!) 97.5 F (36.4 C) (Oral)   Resp 16   Ht 6\' 5"  (1.956 m)   Wt 285 lb (129.3 kg)   SpO2 96%   BMI 33.80 kg/m   BP Readings from Last 3 Encounters:  03/28/20 128/86  03/22/20 127/81  03/15/20 112/69    Wt Readings from Last 3 Encounters:  03/28/20 285 lb (129.3 kg)  03/22/20 282 lb (127.9 kg)  03/15/20 284 lb 12.8 oz (129.2 kg)    Physical Exam Vitals reviewed.  Constitutional:      Appearance: Normal appearance.  HENT:     Nose: Nose normal.     Mouth/Throat:     Mouth: Mucous membranes are moist.  Eyes:     General: No scleral icterus.    Conjunctiva/sclera: Conjunctivae normal.  Cardiovascular:     Rate and Rhythm: Normal rate. Rhythm irregularly irregular.     Heart sounds: No murmur heard.   Musculoskeletal:        General: Normal range of motion.     Cervical back: Neck supple.     Right lower leg: No edema.     Left lower leg: No edema.  Lymphadenopathy:     Cervical: No cervical adenopathy.  Skin:    General: Skin is warm and dry.  Neurological:     General: No focal deficit present.     Mental Status: He is alert.  Psychiatric:        Mood and Affect: Mood normal.     Lab Results  Component Value Date   WBC 9.4 03/21/2020   HGB 13.6 03/21/2020   HCT 43.8 03/21/2020   PLT 196 03/21/2020   GLUCOSE 114 (H) 03/22/2020   CHOL 148 05/29/2019   TRIG 255.0 (H) 05/29/2019   HDL 34.00 (L) 05/29/2019   LDLDIRECT 78.0 05/29/2019   LDLCALC 48 06/01/2018   ALT 17 03/20/2020   AST 17 03/20/2020   NA 140 03/22/2020   K 3.8 03/22/2020   CL 109 03/22/2020   CREATININE 0.87 03/22/2020   BUN 11 03/22/2020   CO2 22 03/22/2020   TSH 4.29 09/06/2019   PSA 4.380 11/14/2019   INR 1.5 (H) 03/20/2020   HGBA1C 6.5 (H) 03/21/2020   MICROALBUR 0.9 05/29/2019    CT Head Wo Contrast  Result Date: 03/20/2020 CLINICAL DATA:  Dizziness, fall. EXAM: CT HEAD WITHOUT CONTRAST CT CERVICAL SPINE WITHOUT CONTRAST TECHNIQUE:  Multidetector CT imaging of the head and cervical spine was performed following the standard protocol without intravenous contrast. Multiplanar CT image reconstructions of the cervical spine were also generated. COMPARISON:  None. FINDINGS: CT HEAD FINDINGS Brain: No evidence of acute infarction, hemorrhage, hydrocephalus, extra-axial collection or mass lesion/mass effect. Vascular: No hyperdense vessel or unexpected calcification. Skull: Normal. Negative for fracture or focal lesion. Sinuses/Orbits: No acute finding. Other: None. CT CERVICAL SPINE FINDINGS Alignment: Minimal grade 1 anterolisthesis of C4-5 is noted secondary to posterior facet joint hypertrophy. Skull base and vertebrae: No acute fracture. No primary bone lesion or focal pathologic process. Soft tissues and spinal canal: No prevertebral fluid or swelling. No visible canal hematoma. Disc levels: Severe degenerative disc disease  is noted at C5-6 and C6-7 with anterior posterior osteophyte formation. Upper chest: Negative. Other: None. IMPRESSION: 1. Normal head CT. 2. Severe multilevel degenerative disc disease. No acute abnormality seen in the cervical spine. Electronically Signed   By: Marijo Conception M.D.   On: 03/20/2020 16:47   CT Cervical Spine Wo Contrast  Result Date: 03/20/2020 CLINICAL DATA:  Dizziness, fall. EXAM: CT HEAD WITHOUT CONTRAST CT CERVICAL SPINE WITHOUT CONTRAST TECHNIQUE: Multidetector CT imaging of the head and cervical spine was performed following the standard protocol without intravenous contrast. Multiplanar CT image reconstructions of the cervical spine were also generated. COMPARISON:  None. FINDINGS: CT HEAD FINDINGS Brain: No evidence of acute infarction, hemorrhage, hydrocephalus, extra-axial collection or mass lesion/mass effect. Vascular: No hyperdense vessel or unexpected calcification. Skull: Normal. Negative for fracture or focal lesion. Sinuses/Orbits: No acute finding. Other: None. CT CERVICAL SPINE  FINDINGS Alignment: Minimal grade 1 anterolisthesis of C4-5 is noted secondary to posterior facet joint hypertrophy. Skull base and vertebrae: No acute fracture. No primary bone lesion or focal pathologic process. Soft tissues and spinal canal: No prevertebral fluid or swelling. No visible canal hematoma. Disc levels: Severe degenerative disc disease is noted at C5-6 and C6-7 with anterior posterior osteophyte formation. Upper chest: Negative. Other: None. IMPRESSION: 1. Normal head CT. 2. Severe multilevel degenerative disc disease. No acute abnormality seen in the cervical spine. Electronically Signed   By: Marijo Conception M.D.   On: 03/20/2020 16:47   CT T-SPINE NO CHARGE  Result Date: 03/20/2020 CLINICAL DATA:  Single episode with fall, back pain EXAM: CT Thoracic Spine with contrast TECHNIQUE: Multiplanar CT images of the thoracic spine were reconstructed from contemporary CT of the Chest. CONTRAST:  No additional COMPARISON:  None FINDINGS: Alignment: Anteroposterior alignment is maintained. Vertebrae: Vertebral body heights are maintained apart from degenerative endplate irregularity. No acute fracture. Paraspinal and other soft tissues: Unremarkable. Extra-spinal findings are better evaluated on concurrent dedicated imaging. Disc levels: Multilevel disc space narrowing, small endplate osteophytes, and facet hypertrophy. No high-grade osseous encroachment on the spinal canal. IMPRESSION: No acute thoracic spine fracture. Electronically Signed   By: Macy Mis M.D.   On: 03/20/2020 16:44   DG Chest Port 1 View  Result Date: 03/20/2020 CLINICAL DATA:  Status post fall.  Hit is head. EXAM: PORTABLE CHEST 1 VIEW COMPARISON:  12/28/2019 FINDINGS: No focal consolidation. No pleural effusion or pneumothorax. Heart and mediastinal contours are unremarkable. Prior median sternotomy. No acute osseous abnormality. IMPRESSION: No active disease. Electronically Signed   By: Kathreen Devoid   On: 03/20/2020 14:35     ECHOCARDIOGRAM COMPLETE  Result Date: 03/21/2020    ECHOCARDIOGRAM REPORT   Patient Name:   SHAQUON GROPP Date of Exam: 03/21/2020 Medical Rec #:  [id number]       Height:       77.0 in Accession #:    [id number]      Weight:       277.0 lb Date of Birth:  Jun 09, 1946      BSA:          2.569 m Patient Age:    61 years        BP:           130/79 mmHg Patient Gender: M               HR:           70 bpm. Exam Location:  Inpatient Procedure: 2D Echo, Cardiac Doppler,  Color Doppler and Intracardiac            Opacification Agent Indications:     [place]  History:         Patient has prior history of Echocardiogram examinations, most                  recent 12/01/2019. Aortic Valve Disease, Arrythmias:Atrial                  Fibrillation, Signs/Symptoms:Syncope; Risk                  Factors:Hypertension, Diabetes, Dyslipidemia and Obesity. MAZE                  procedure.                  Aortic Valve: 25 mm Edwards bioprosthetic valve is present in                  the aortic position.  Sonographer:     Dustin Flock Referring Phys:  [id number] [place] Diagnosing Phys: Loralie Champagne MD IMPRESSIONS  1. Left ventricular ejection fraction, by estimation, is 55 to 60%. The left ventricle has normal function. The left ventricle has no regional wall motion abnormalities. There is mild left ventricular hypertrophy. Left ventricular diastolic parameters are consistent with Grade I diastolic dysfunction (impaired relaxation).  2. Right ventricular systolic function is mildly reduced. The right ventricular size is mildly enlarged. Tricuspid regurgitation signal is inadequate for assessing PA pressure. Mildly D-shaped interventricular septum suggests a degree of RV pressure/volume overload.  3. Right atrial size was mildly dilated.  4. The mitral valve is normal in structure. No evidence of mitral valve regurgitation. No evidence of mitral stenosis.  5. There is a bioprosthetic aortic valve. No significant  regurgitation or stenosis.  6. The inferior vena cava is normal in size with greater than 50% respiratory variability, suggesting right atrial pressure of 3 mmHg. FINDINGS  Left Ventricle: Left ventricular ejection fraction, by estimation, is 55 to 60%. The left ventricle has normal function. The left ventricle has no regional wall motion abnormalities. Definity contrast agent was given IV to delineate the left ventricular  endocardial borders. The left ventricular internal cavity size was normal in size. There is mild left ventricular hypertrophy. Left ventricular diastolic parameters are consistent with Grade I diastolic dysfunction (impaired relaxation). Right Ventricle: The right ventricular size is mildly enlarged. No increase in right ventricular wall thickness. Right ventricular systolic function is mildly reduced. Tricuspid regurgitation signal is inadequate for assessing PA pressure. Left Atrium: Left atrial size was normal in size. Right Atrium: Right atrial size was mildly dilated. Pericardium: There is no evidence of pericardial effusion. Mitral Valve: The mitral valve is normal in structure. No evidence of mitral valve regurgitation. No evidence of mitral valve stenosis. Tricuspid Valve: The tricuspid valve is normal in structure. Tricuspid valve regurgitation is not demonstrated. Aortic Valve: There is a bioprosthetic aortic valve. No significant regurgitation or stenosis. The aortic valve has been repaired/replaced. Aortic valve regurgitation is not visualized. No aortic stenosis is present. There is a 25 mm Edwards bioprosthetic valve present in the aortic position. Pulmonic Valve: The pulmonic valve was normal in structure. Pulmonic valve regurgitation is not visualized. Aorta: The aortic root is normal in size and structure. Venous: The inferior vena cava is normal in size with greater than 50% respiratory variability, suggesting right atrial pressure of 3 mmHg. IAS/Shunts: No atrial level shunt  detected by color flow Doppler.  LEFT VENTRICLE PLAX 2D LVIDd:         4.62 cm  Diastology LVIDs:         3.58 cm  LV e' medial:    6.85 cm/s LV PW:         1.56 cm  LV E/e' medial:  7.4 LV IVS:        1.50 cm  LV e' lateral:   7.51 cm/s LVOT diam:     2.30 cm  LV E/e' lateral: 6.8 LV SV:         105 LV SV Index:   41 LVOT Area:     4.15 cm  RIGHT VENTRICLE RV Basal diam:  4.43 cm RV S prime:     7.40 cm/s TAPSE (M-mode): 3.6 cm LEFT ATRIUM             Index       RIGHT ATRIUM           Index LA diam:        3.60 cm 1.40 cm/m  RA Area:     19.60 cm LA Vol (A2C):   41.9 ml 16.31 ml/m RA Volume:   60.80 ml  23.67 ml/m LA Vol (A4C):   69.3 ml 26.98 ml/m LA Biplane Vol: 57.6 ml 22.42 ml/m  AORTIC VALVE LVOT Vmax:   122.00 cm/s LVOT Vmean:  76.300 cm/s LVOT VTI:    0.252 m  AORTA Ao Root diam: 3.20 cm MITRAL VALVE MV Area (PHT): 2.50 cm    SHUNTS MV Decel Time: 303 msec    Systemic VTI:  0.25 m MV E velocity: 51.00 cm/s  Systemic Diam: 2.30 cm MV A velocity: 60.80 cm/s MV E/A ratio:  0.84 Loralie Champagne MD Electronically signed by Loralie Champagne MD Signature Date/Time: 03/21/2020/2:59:22 PM    Final (Updated)    CT Angio Chest/Abd/Pel for Dissection W and/or Wo Contrast  Result Date: 03/20/2020 CLINICAL DATA:  Recent syncopal episode and history of abdominal pain and chest pain with fall, initial encounter EXAM: CT ANGIOGRAPHY CHEST, ABDOMEN AND PELVIS TECHNIQUE: Non-contrast CT of the chest was initially obtained. Multidetector CT imaging through the chest, abdomen and pelvis was performed using the standard protocol during bolus administration of intravenous contrast. Multiplanar reconstructed images and MIPs were obtained and reviewed to evaluate the vascular anatomy. CONTRAST:  147mL OMNIPAQUE IOHEXOL 350 MG/ML SOLN COMPARISON:  09/14/2019, 02/21/2015 FINDINGS: CTA CHEST FINDINGS Cardiovascular: Initial precontrast images demonstrate atherosclerotic calcification of the thoracic aorta. Aortic valve  replacement is noted. No hyperdense crescent is identified to suggest acute aortic injury. There are changes consistent with ascending aortic repair in addition to the aortic valve replacement. No dissection is seen. Aneurysmal dilatation of the descending thoracic aorta is noted measuring 4.4 cm. No significant tapering is noted distally. Coronary calcifications are seen. Pulmonary artery shows no large central embolus although timing was not performed for pulmonary embolus evaluation. Mediastinum/Nodes: Thoracic inlet is within normal limits. No hilar or mediastinal adenopathy is noted. The esophagus as visualized shows mild distal wall thickening similar to that seen on the prior exam likely related to a degree of reflux. Lungs/Pleura: Lungs are well aerated bilaterally. No focal infiltrate or sizable effusion is noted. Mild emphysematous changes are seen. 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 This is stable from the prior exam. Degenerative changes of the thoracic spine are noted. No compression deformities are seen. Review of the MIP images confirms the above findings. CTA ABDOMEN  AND PELVIS FINDINGS VASCULAR Aorta: Abdominal aorta demonstrates atherosclerotic calcifications. No evidence of aneurysmal dilatation is seen. Celiac: Mild narrowing of the celiac axis is noted proximally likely related to median arcuate compression. Minimal poststenotic dilatation is seen. SMA: Patent without evidence of aneurysm, dissection, vasculitis or significant stenosis. Renals: Single renal arteries are identified bilaterally. IMA: Patent without evidence of aneurysm, dissection, vasculitis or significant stenosis. Iliacs: Mild atherosclerotic calcifications are noted. No aneurysmal dilatation or dissection is noted. Veins: No specific venous abnormality is seen. Review of the MIP images confirms the above findings. NON-VASCULAR Hepatobiliary: No focal liver abnormality is seen. No gallstones, gallbladder wall  thickening, or biliary dilatation. Pancreas: Unremarkable. No pancreatic ductal dilatation or surrounding inflammatory changes. Spleen: Normal in size without focal abnormality. Adrenals/Urinary Tract: Adrenal glands are within normal limits. Kidneys are well visualized bilaterally. No renal calculi or obstructive changes are seen. Large right renal cyst is noted which measures approximately 12 cm in greatest dimension. This is similar to that seen on the prior exam. A small exophytic area of calcification is noted along its superior margin laterally similar to that noted on previous exams. No obstructive changes are noted. The bladder is well distended. Changes consistent with urolift are noted. Stomach/Bowel: Colon shows scattered diverticular change without evidence of diverticulitis. No obstructive or inflammatory changes are noted. The appendix is within normal limits. No small bowel abnormality is seen. No gastric abnormality is noted. Lymphatic: No significant lymphadenopathy is noted. Reproductive: Prostate is unremarkable with the exception of uro lift device is superiorly. Other: No abdominal wall hernia or abnormality. No abdominopelvic ascites. Musculoskeletal: No acute or significant osseous findings. Review of the MIP images confirms the above findings. IMPRESSION: No evidence of aortic dissection. There are changes consistent with aortic valve repair and replacement of the ascending aorta. Stable dilatation of the descending aorta is noted. Changes suggestive of distal esophageal reflux. Chronic changes in the abdomen and pelvis similar to that seen on prior exams. Electronically Signed   By: Inez Catalina M.D.   On: 03/20/2020 16:51    Assessment & Plan:   Garin was seen today for diabetes, hypertension and atrial fibrillation.  Diagnoses and all orders for this visit:  BPH with elevated PSA- His PSA was done elsewhere about 4 months ago was down to 4.4. This is reassuring.  DDD  (degenerative disc disease), cervical -     oxyCODONE (OXY IR/ROXICODONE) 5 MG immediate release tablet; Take 1 tablet (5 mg total) by mouth every 6 (six) hours as needed for severe pain.  DDD (degenerative disc disease), lumbar -     oxyCODONE (OXY IR/ROXICODONE) 5 MG immediate release tablet; Take 1 tablet (5 mg total) by mouth every 6 (six) hours as needed for severe pain.  Essential hypertension- His blood pressure is adequately well controlled.   Chronic diastolic CHF (congestive heart failure), NYHA class 2 ([place])- He has a normal volume status. Will continue the combination of spironolactone and losartan.  Type 2 diabetes mellitus with complication, without long-term current use of insulin ([facility])- His recent A1c was 6.5%. His blood sugar is adequately well controlled.  Persistent atrial fibrillation ([place])- He has good rate control. Will continue anticoagulation with the DOAC. He is scheduled for an event monitor with cardiology.   I am having [name] maintain his Menthol-Methyl Salicylate (SALONPAS PAIN RELIEF PATCH EX), Synjardy XR, alfuzosin, triazolam, Xarelto, atorvastatin, metoprolol tartrate, losartan, spironolactone, and oxyCODONE.  Meds ordered this encounter  Medications  . oxyCODONE (OXY IR/ROXICODONE)  5 MG immediate release tablet    Sig: Take 1 tablet (5 mg total) by mouth every 6 (six) hours as needed for severe pain.    Dispense:  75 tablet    Refill:  0     Follow-up: Return in about 4 months (around 07/29/2020).  Scarlette Calico, MD

## 2020-04-08 ENCOUNTER — Other Ambulatory Visit: Payer: Self-pay | Admitting: Internal Medicine

## 2020-04-08 DIAGNOSIS — I4891 Unspecified atrial fibrillation: Secondary | ICD-10-CM

## 2020-04-08 MED ORDER — RIVAROXABAN 20 MG PO TABS: 20.0000 mg | ORAL_TABLET | Freq: Every day | ORAL | 5 refills | Status: DC

## 2020-04-11 ENCOUNTER — Other Ambulatory Visit: Payer: Self-pay | Admitting: Internal Medicine

## 2020-04-11 DIAGNOSIS — N400 Enlarged prostate without lower urinary tract symptoms: Secondary | ICD-10-CM

## 2020-04-11 MED ORDER — ALFUZOSIN HCL ER 10 MG PO TB24: 10.0000 mg | ORAL_TABLET | Freq: Every day | ORAL | 1 refills | Status: DC

## 2020-04-24 ENCOUNTER — Other Ambulatory Visit (HOSPITAL_COMMUNITY): Payer: Self-pay | Admitting: Nurse Practitioner

## 2020-05-16 ENCOUNTER — Telehealth: Payer: Self-pay | Admitting: Internal Medicine

## 2020-05-16 NOTE — Telephone Encounter (Signed)
triazolam (HALCION) 0.25 MG tablet [id number] - [place], [place] [address] Phone:  [phone]  Fax:  [phone]     Patient requesting a refill Last seen-10.25.21 Next apt- N/A  Patient is also going next week to his dentist and its getting a cleaning and drilling done to his teeth so he is wondering if he needs to stop taking his rivaroxaban (XARELTO) 20 MG TABS tablet for that.  [phone]

## 2020-05-17 ENCOUNTER — Other Ambulatory Visit: Payer: Self-pay | Admitting: Internal Medicine

## 2020-05-17 DIAGNOSIS — G4730 Sleep apnea, unspecified: Secondary | ICD-10-CM

## 2020-05-17 DIAGNOSIS — G4700 Insomnia, unspecified: Secondary | ICD-10-CM

## 2020-05-17 NOTE — Telephone Encounter (Signed)
Pt has been informed and expressed understanding.  

## 2020-05-19 ENCOUNTER — Other Ambulatory Visit: Payer: Self-pay | Admitting: Cardiology

## 2020-05-27 ENCOUNTER — Other Ambulatory Visit: Payer: Self-pay | Admitting: Cardiothoracic Surgery

## 2020-05-27 DIAGNOSIS — I712 Thoracic aortic aneurysm, without rupture, unspecified: Secondary | ICD-10-CM

## 2020-06-13 NOTE — Progress Notes (Signed)
Cardiology Office Note:    Date:  06/19/2020   ID:  Clinton Black, Clinton Black 04/14/1946, MRN [id number]  PCP:  [name], MD  Cardiologist:   [name], MD  Electrophysiologist:  [name], MD   Referring MD: [name], MD   Chief Complaint  Patient presents with  . Follow-up    2 months.  . Shortness of Breath  . Headache    History of Present Illness:    Clinton Black is a 74 y.o. male with a hx of HTN, HLD, DM II, TAA, morbid obesity and persistent atrial fibrillation.  Previous Myoview in August 2018 showed EF 39%, no ischemia, intermediate risk due to reduced LV function.  Echocardiogram obtained around the same time showed EF 55%.  Repeat echocardiogram in October 2019 showed EF 50 to 55%, grade 1 DD, dilated thoracic aorta measuring at 5 cm, moderate AI.  He was diagnosed with atrial fibrillation in December 2019 and started on Xarelto.  He underwent a successful cardioversion in January 2020.  Unfortunately he quickly went back into atrial fibrillation afterward.  Even though he was loaded on amiodarone, subsequent cardioversion was unsuccessful.  Amiodarone was subsequently discontinued.  He returned to the ED in March 2020 with facial droop secondary to Bell's palsy.  MRI was negative for CVA.  Cardiac catheterization performed in September 2020 showed normal coronary anatomy, moderate AI.  He eventually underwent A. fib ablation by Dr. Curt Bears on 04/07/2019.  Postprocedure, he felt better and breathing has improved.  Unfortunately he had recurrence of atrial fibrillation by December 2020 and was placed back on amiodarone therapy.  He underwent a DC cardioversion in January 2021.  Due to the enlarging aortic root, patient eventually underwent planned AVR and aortic graft by Dr. Pia Mau on 10/16/2019 using a Edwards Paramount magna ease 25 mm bioprosthetic aortic valve and a 34 mm graft for the ascending aorta.  He also underwent a modified maze procedure with  bilateral pulmonary vein isolation with RF ablation, clipping of the left atrial appendage.  Postop course was uneventful, he was eventually discharged on 10/23/2019.  He was admitted in October with an episode of syncope. Was orthostatic. Echo was unremarkable. Was hydrated and aldactone was discontinued. Had previously been seen in our office a few days prior and event monitor ordered but never done. He did resume aldactone.  On follow up today he is having persistent dizziness. Notes his head and room spin. Worse when he first gets up otherwise no clear triggers.  He denies any dyspnea, chest pain or palpitations.    Past Medical History:  Diagnosis Date  . Arthritis   . Atrial fibrillation ([place])   . BPH (benign prostatic hyperplasia)   . Diabetes mellitus type 2 in obese (H[facility])    diet controlled  . HTN (hypertension)   . Hyperlipidemia, mixed   . Insomnia   . Obesity   . Thoracic aortic aneurysm, without rupture ([facility])    4.7 cm per chest ct with contrast 11-04-17    Past Surgical History:  Procedure Laterality Date  . AORTIC VALVE REPLACEMENT N/A 10/16/2019   Procedure: AORTIC VALVE REPLACEMENT (AVR) using Edwards PERIMOUNT Magna Ease 25MM Bioprosthesis Aortic Valve.;  Surgeon: [name], MD;  Location: [facility];  Service: Open Heart Surgery;  Laterality: N/A;  Right subclavian artery cannulation.  . AORTIC VALVE REPLACEMENT  10/16/2019   AORTIC VALVE REPLACEMENT (AVR)   . ATRIAL FIBRILLATION ABLATION N/A 04/07/2019   Procedure: ATRIAL  FIBRILLATION ABLATION;  Surgeon: [name], MD;  Location: [facility];  Service: Cardiovascular;  Laterality: N/A;  . CARDIOVERSION N/A 07/12/2018   Procedure: CARDIOVERSION;  Surgeon: Thayer Headings, MD;  Location: [facility] ENDOSCOPY;  Service: Cardiovascular;  Laterality: N/A;  . CARDIOVERSION N/A 08/22/2018   Procedure: CARDIOVERSION;  Surgeon: Skeet Latch, MD;  Location: [place];  Service: Cardiovascular;  Laterality:  N/A;  . CARDIOVERSION N/A 06/28/2019   Procedure: CARDIOVERSION;  Surgeon: Thayer Headings, MD;  Location: [place];  Service: Cardiovascular;  Laterality: N/A;  . CLIPPING OF ATRIAL APPENDAGE N/A 10/16/2019   Procedure: Clipping Of Atrial Appendage using AtriCure AtriClip Exclusion VLAA 29mm.;  Surgeon: [name], MD;  Location: [place];  Service: Open Heart Surgery;  Laterality: N/A;  . COLONOSCOPY  2016  . CYST EXCISION     polynomial  . CYSTOSCOPY WITH INSERTION OF UROLIFT N/A 04/18/2018   Procedure: CYSTOSCOPY WITH INSERTION OF UROLIFT;  Surgeon: Cleon Gustin, MD;  Location[facility];  Service: Urology;  Laterality: N/A;  . KNEE SURGERY Right 2000   arthroscopy  . MAZE N/A 10/16/2019   Procedure: MAZE with bilateral pulmonary vein isolation.;  Surgeon: [name], MD;  Location: [place];  Service: Open Heart Surgery;  Laterality: N/A;  Maze procedure using Atricure OLL2 Isolator clamp.  . REPLACEMENT ASCENDING AORTA N/A 10/16/2019   Procedure: SUPRA CORONARY REPLACEMENT OF ASCENDING AORTA using Maquet Hemashiel Platinum 34 MM Vascular Graft.;  Surgeon: [name], MD;  Location: [place];  Service: Open Heart Surgery;  Laterality: N/A;  Right subclavian artery cannulation.  Marland Kitchen RIGHT/LEFT HEART CATH AND CORONARY ANGIOGRAPHY N/A 01/30/2019   Procedure: RIGHT/LEFT HEART CATH AND CORONARY ANGIOGRAPHY;  Surgeon: [name],  M, MD;  Location: [place] [facility];  Service: Cardiovascular;  Laterality: N/A;  . TEE WITHOUT CARDIOVERSION N/A 10/16/2019   Procedure: TRANSESOPHAGEAL ECHOCARDIOGRAM (TEE);  Surgeon: [name], MD;  Location: [place];  Service: Open Heart Surgery;  Laterality: N/A;  . TONSILLECTOMY    . widson teeth extraction      Current Medications: Current Meds  Medication Sig  . alfuzosin (UROXATRAL) 10 MG 24 hr tablet Take 1 tablet (10 mg total) by mouth daily with breakfast.  . atorvastatin (LIPITOR) 20 MG tablet TAKE 1 TABLET BY  MOUTH EVERYDAY AT BEDTIME (Patient taking differently: Take 20 mg by mouth at bedtime.)  . Empagliflozin-metFORMIN HCl ER (SYNJARDY XR) 25-1000 MG TB24 Take 1 tablet by mouth daily.  Marland Kitchen losartan (COZAAR) 25 MG tablet TAKE 2 TABLETS EVERY DAY  . Menthol-Methyl Salicylate (SALONPAS PAIN RELIEF PATCH EX) Apply 1 patch topically daily as needed (back pain).   . metoprolol tartrate (LOPRESSOR) 25 MG tablet Take 0.5 tablets (12.5 mg total) by mouth 2 (two) times daily.  . rivaroxaban (XARELTO) 20 MG TABS tablet Take 1 tablet (20 mg total) by mouth daily with supper.  . triazolam (HALCION) 0.25 MG tablet TAKE 1 TABLET BY MOUTH AT BEDTIME AS NEEDED FOR SLEEP.  . [DISCONTINUED] spironolactone (ALDACTONE) 25 MG tablet Take 0.5 tablets (12.5 mg total) by mouth at bedtime.     Allergies:   Quinolones   Social History   Socioeconomic History  . Marital status: Single    Spouse name: Not on file  . Number of children: Not on file  . Years of education: Not on file  . Highest education level: Not on file  Occupational History  . Not on file  Tobacco  Use  . Smoking status: Former Smoker    Packs/day: 1.50    Years: 5.00    Pack years: 7.50    Types: Cigarettes    Quit date: 06/15/1994    Years since quitting: 26.0  . Smokeless tobacco: Never Used  Vaping Use  . Vaping Use: Never used  Substance and Sexual Activity  . Alcohol use: Not Currently    Alcohol/week: 14.0 standard drinks    Types: 14 Shots of liquor per week    Comment: 1 drink of liquor per day  . Drug use: Yes    Types: Marijuana    Comment: occasionally marijuana  . Sexual activity: Yes    Partners: Female    Birth control/protection: Condom    Comment: condom use most of the time but not always  Other Topics Concern  . Not on file  [place] [employer] and [employer]. married '72- 3 years/divorced; married '89- 3 years/divorced;. married '03 - 3 years/divorced. 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    Social Determinants of Health   Financial Resource Strain: Medium Risk  . Difficulty of Paying Living Expenses: Somewhat hard  Food Insecurity: No Food Insecurity  . Worried About Charity fundraiser in the Last Year: Never true  . Ran Out of Food in the Last Year: Never true  Transportation Needs: No Transportation Needs  . Lack of Transportation (Medical): No  . Lack of Transportation (Non-Medical): No  Physical Activity: Not on file  Stress: Not on file  Social Connections: Not on file     Family History: The patient's family history includes Aneurysm in his father; Arthritis in his mother; Heart disease in his father; Macular degeneration in his mother; Varicose Veins in his mother. There is no history of Cancer, Colon cancer, Esophageal cancer, Rectal cancer, or Stomach cancer.  ROS:   Please see the history of present illness.     All other systems reviewed and are negative.  EKGs/Labs/Other Studies Reviewed:    The following studies were reviewed today:  Echo 07/28/2019 1. Left ventricular ejection fraction, by estimation, is 60%. The left  ventricle has normal function. The left ventricle has no regional wall  motion abnormalities. The left ventricular internal cavity size was mildly  dilated. There is mildly increased  left ventricular hypertrophy. Left ventricular diastolic parameters are  indeterminate.  2. Right ventricular systolic function is normal. The right ventricular  size is normal. Tricuspid regurgitation signal is inadequate for assessing  PA pressure.  3. The mitral valve is normal in structure and function. No evidence of  mitral valve regurgitation. No evidence of mitral stenosis.  4. The aortic valve is grossly normal. Aortic valve regurgitation is  moderate. No aortic stenosis is present.  5. Aortic dilatation noted. 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  6. The inferior vena cava is normal in size  with greater than 50%  respiratory variability, suggesting right atrial pressure of 3 mmHg.   Echo 12/01/19: IMPRESSIONS    1. Left ventricular ejection fraction, by estimation, is 55 to 60%. The  left ventricle has normal function. The left ventricle has no regional  wall motion abnormalities. There is moderate asymmetric left ventricular  hypertrophy of the basal-septal  segment. Left ventricular diastolic parameters are consistent with Grade I  diastolic dysfunction (impaired relaxation).  2. Right ventricular systolic function is normal. The right ventricular  size is normal.  3.  Left atrial size was mildly dilated.  4. The mitral valve is normal in structure. Trivial mitral valve  regurgitation. No evidence of mitral stenosis.  5. Normal transaortic gradients, no paravalvular leak. The aortic valve  has been repaired/replaced. Aortic valve regurgitation is not visualized.  No aortic stenosis is present. There is a 25 mm Edwards pericardial tissue  valve present in the aortic  position. Aortic valve mean gradient measures 8.0 mmHg.  6. Aortic root/ascending aorta has been repaired/replaced and S/P  supracoronary replacement of the ascending aorta with a 34 mm Hemashield  platinum graft. There is moderate dilatation of the ascending aorta  measuring 47 mm.  7. The inferior vena cava is normal in size with greater than 50%  respiratory variability, suggesting right atrial pressure of 3 mmHg.    Echo 03/21/20: IMPRESSIONS    1. Left ventricular ejection fraction, by estimation, is 55 to 60%. The  left ventricle has normal function. The left ventricle has no regional  wall motion abnormalities. There is mild left ventricular hypertrophy.  Left ventricular diastolic parameters  are consistent with Grade I diastolic dysfunction (impaired relaxation).  2. Right ventricular systolic function is mildly reduced. The right  ventricular size is mildly enlarged. Tricuspid  regurgitation signal is  inadequate for assessing PA pressure. Mildly D-shaped interventricular  septum suggests a degree of RV  pressure/volume overload.  3. Right atrial size was mildly dilated.  4. The mitral valve is normal in structure. No evidence of mitral valve  regurgitation. No evidence of mitral stenosis.  5. There is a bioprosthetic aortic valve. No significant regurgitation or  stenosis.  6. The inferior vena cava is normal in size with greater than 50%  respiratory variability, suggesting right atrial pressure of 3 mmHg.   EKG:  EKG is not  ordered today.    Recent Labs: 09/06/2019: TSH 4.29 10/17/2019: Magnesium 2.1 03/20/2020: ALT 17 03/21/2020: Hemoglobin 13.6; Platelets 196 03/22/2020: BUN 11; Creatinine, Ser 0.87; Potassium 3.8; Sodium 140   Recent Lipid Panel    Component Value Date/Time   CHOL 148 05/29/2019 1430   CHOL 105 03/11/2017 1541   TRIG 255.0 (H) 05/29/2019 1430   HDL 34.00 (L) 05/29/2019 1430   HDL 36 (L) 03/11/2017 1541   CHOLHDL 4 05/29/2019 1430   VLDL 51.0 (H) 05/29/2019 1430   LDLCALC 48 06/01/2018 1328   LDLCALC 46 03/11/2017 1541   LDLDIRECT 78.0 05/29/2019 1430    Physical Exam:    VS:  BP 110/66 (BP Location: Left Arm, Patient Position: Sitting, Cuff Size: Large)   Pulse 68   Ht 6\' 5"  (1.956 m)   Wt 279 lb (126.6 kg)   BMI 33.08 kg/m     Wt Readings from Last 3 Encounters:  06/19/20 279 lb (126.6 kg)  03/28/20 285 lb (129.3 kg)  03/22/20 282 lb (127.9 kg)     GEN:  Well nourished, well developed in no acute distress HEENT: Normal NECK: No JVD; No carotid bruits LYMPHATICS: No lymphadenopathy CARDIAC: RRR, soft SEM RUSB, rubs, gallops RESPIRATORY:  Clear to auscultation without rales, wheezing or rhonchi  ABDOMEN: Soft, non-tender, non-distended MUSCULOSKELETAL:  No edema; No deformity  SKIN: Warm and dry NEUROLOGIC:  Alert and oriented x 3 PSYCHIATRIC:  Normal affect   ASSESSMENT:    1. S/P AVR (aortic valve  replacement) and aortoplasty   2. Thoracic aortic aneurysm without rupture ([place])   3. Syncope and collapse   4. Dizziness   5. Essential hypertension  PLAN:    In order of problems listed above:  1. S/p  AVR and aortoplasty with bioprosthetic valve on 10/16/19: good recovery.   Postprocedure Echo showed good valve function. Repeat Echo in October looked good. Normal EF. SBE prophylaxis.   2. Persistent atrial fibrillation: s/p ablation with recurrence. Now s/p MAZE at time of surgery. Maintaining NSR. Off amiodarone. Rhythm regular.  Should continue Xarelto.   3. Hypertension: Blood pressure is well controlled. He notes it will shoot up when he argues with his wife.   4. Hyperlipidemia: Continue Lipitor  5. DM2: Managed by primary care provider.  6.   Chronic venous ulcer.   7.   Dizziness. When admitted in October he was orthostatic. I have recommended he discontinue his aldactone. If symptoms persist may want to consider ENT evaluation and/or vestibular training.    Medication Adjustments/Labs and Tests Ordered: Current medicines are reviewed at length with the patient today.  Concerns regarding medicines are outlined above.  No orders of the defined types were placed in this encounter.  No orders of the defined types were placed in this encounter.   Patient Instructions  Stop taking spironolactone  Continue your other therapy  Let me know if the dizziness persists and we could consider some vestibular training     Signed,  [place], MD  06/19/2020 11:53 AM    [facility]

## 2020-06-19 ENCOUNTER — Ambulatory Visit: Payer: Medicare HMO | Admitting: Cardiology

## 2020-06-19 ENCOUNTER — Other Ambulatory Visit: Payer: Self-pay

## 2020-06-19 ENCOUNTER — Encounter: Payer: Self-pay | Admitting: Cardiology

## 2020-06-19 VITALS — BP 110/66 | HR 68 | Ht 77.0 in | Wt 279.0 lb

## 2020-06-19 DIAGNOSIS — R55 Syncope and collapse: Secondary | ICD-10-CM | POA: Diagnosis not present

## 2020-06-19 DIAGNOSIS — R42 Dizziness and giddiness: Secondary | ICD-10-CM

## 2020-06-19 DIAGNOSIS — I712 Thoracic aortic aneurysm, without rupture, unspecified: Secondary | ICD-10-CM

## 2020-06-19 DIAGNOSIS — I10 Essential (primary) hypertension: Secondary | ICD-10-CM

## 2020-06-19 DIAGNOSIS — Z952 Presence of prosthetic heart valve: Secondary | ICD-10-CM | POA: Diagnosis not present

## 2020-06-19 NOTE — Patient Instructions (Signed)
Stop taking spironolactone  Continue your other therapy  Let me know if the dizziness persists and we could consider some vestibular training

## 2020-06-27 ENCOUNTER — Inpatient Hospital Stay: Admission: RE | Admit: 2020-06-27 | Payer: Medicare HMO | Source: Ambulatory Visit

## 2020-06-27 ENCOUNTER — Ambulatory Visit: Payer: Medicare HMO | Admitting: Cardiothoracic Surgery

## 2020-07-02 ENCOUNTER — Telehealth: Payer: Self-pay | Admitting: Internal Medicine

## 2020-07-02 NOTE — Telephone Encounter (Signed)
Patient was wondering if he could pick up samples for Empagliflozin-metFORMIN HCl ER (SYNJARDY XR) 25-1000 MG  Please call the patient back at [phone]. He said he has one pill left.

## 2020-07-02 NOTE — Telephone Encounter (Signed)
Patient was wondering if he could have some samples of Empagliflozin-metFORMIN HCl ER (SYNJARDY XR) 25-1000 MG TB24 [ He said that sine it is after 06/15/20 he has to reapply for the assistance program and was wondering if he could have some samples to last him until after his paper work for the assistance is sent in. Please call the patient at [phone].

## 2020-07-03 NOTE — Telephone Encounter (Signed)
LVM stating samples were ready for pick up.

## 2020-07-03 NOTE — Telephone Encounter (Signed)
The samples are on your desk  TJ

## 2020-07-05 ENCOUNTER — Telehealth: Payer: Medicare HMO

## 2020-07-05 NOTE — Chronic Care Management (AMB) (Deleted)
Chronic Care Management Pharmacy  Name: Clinton Black  MRN: [id number] DOB: November 28, 1945   Chief Complaint/ HPI  Clinton Black,  75 y.o. , male presents for their Follow-Up CCM visit with the clinical pharmacist via telephone due to COVID-19 Pandemic.  PCP : [name], MD Patient Care Team: [name], MD as PCP - General (Internal Medicine) [name], [name], MD as PCP - Cardiology (Cardiology) [name], MD as PCP - Electrophysiology (Cardiology) Charlton Haws, [facility] as Pharmacist (Pharmacist)  Their chronic conditions include: Hypertension, Hyperlipidemia, Diabetes, Atrial Fibrillation, Heart Failure, BPH and Chronic Bronchitis, Degenerative joint disease, Insomnia  Moved to [place], retired 3 years ago. [place]. Taking care of significant other. Talking on the phone, taking walks.   Office Visits: 03/28/20 Dr Ronnald Ramp OV: hospital f/u. Refilled oxycodone. Continue spironolactone.  11/14/19 Dr Ronnald Ramp OV: routine f/u, d/c'd furosemide and potassium.  09/06/19 Dr Ronnald Ramp OV: pt c/o BRBPR, likely hemorrhoidal, deferred rectal exam, no med changes.  Consult Visit: 06/19/20 Dr [name] (cardiology): persistent dizziness. Heart monitor never done. D/C spironolactone d/t dizziness.  03/20/20 - 03/22/20 hospitalization: syncope/collapse, hit his head. Imaging negative. Hold spironolactone until PCP f/u.  03/15/20 PA Rosaria Ferries (cardiology): recent ED visit related to dehydration, overexertion  03/14/20 ED visit: palpitations, f/u with cardiologist. Ordered cardiac event monitor.  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 S/p Afib ablation, MAZE, maintaining NSR. Stopped amiodarone. Continue Xarelto.  12/28/19 Dr Servando Snare (CT surgery): f/u AVR, ascending aorta replacement, maze procedure, atrial clip - pt recovering well.   11/10/19 (cardiology): s/p AVR and aortoplasty; instructed to hold furosemide and potassium  indefinitely due to significant weight loss while on short course of diuretic.  Allergies  Allergen Reactions  . Quinolones Other (See Comments)    Patient was warned about not using Cipro and similar antibiotics. Recent studies have raised concern that fluoroquinolone antibiotics could be associated with an increased risk of aortic aneurysm Fluoroquinolones have non-antimicrobial properties that might jeopardise the integrity of the extracellular matrix of the vascular wall In a  propensity score matched cohort study in [place], there was a 66% increased rate of aortic aneurysm or dissection associated with oral fluoroquinolone use, compared wit    Medications: Outpatient Encounter Medications as of 07/05/2020  Medication Sig Note  . alfuzosin (UROXATRAL) 10 MG 24 hr tablet Take 1 tablet (10 mg total) by mouth daily with breakfast.   . atorvastatin (LIPITOR) 20 MG tablet TAKE 1 TABLET BY MOUTH EVERYDAY AT BEDTIME (Patient taking differently: Take 20 mg by mouth at bedtime.)   . Empagliflozin-metFORMIN HCl ER (SYNJARDY XR) 25-1000 MG TB24 Take 1 tablet by mouth daily.   Marland Kitchen losartan (COZAAR) 25 MG tablet TAKE 2 TABLETS EVERY DAY   . Menthol-Methyl Salicylate (SALONPAS PAIN RELIEF PATCH EX) Apply 1 patch topically daily as needed (back pain).    . metoprolol tartrate (LOPRESSOR) 25 MG tablet Take 0.5 tablets (12.5 mg total) by mouth 2 (two) times daily. 03/20/2020: Pt takes at 09:00 and 21:00  . rivaroxaban (XARELTO) 20 MG TABS tablet Take 1 tablet (20 mg total) by mouth daily with supper.   . triazolam (HALCION) 0.25 MG tablet TAKE 1 TABLET BY MOUTH AT BEDTIME AS NEEDED FOR SLEEP.    No facility-administered encounter medications on file as of 07/05/2020.   Wt Readings from Last 3 Encounters:  06/19/20 279 lb (126.6 kg)  03/28/20 285 lb (129.3 kg)  03/22/20 282 lb (127.9 kg)  Lab Results  Component Value Date   CREATININE 0.87 03/22/2020   BUN 11 03/22/2020   GFR 69.38 09/06/2019    GFRNONAA >60 03/22/2020   GFRAA >60 03/14/2020   NA 140 03/22/2020   K 3.8 03/22/2020   CALCIUM 8.8 (L) 03/22/2020   CO2 22 03/22/2020    Current Diagnosis/Assessment:    Goals Addressed   None     Heart Failure / Hypertension   HF Type: Diastolic Last ejection fraction: 55-60% (12/01/2019) NYHA Class: II (slight limitation of activity)  BP goal is:  <130/80 BP Readings from Last 3 Encounters:  06/19/20 110/66  03/28/20 128/86  03/22/20 127/81   Patient has failed these meds in past: furosemide, spironolactone Patient is currently controlled on the following medications:  Marland Kitchen Metoprolol tartrate 25 mg - 1/2 tablet BID  We discussed diet and exercise extensively; BP goals; benefits of medications   Plan  Continue current medications and control with diet and exercise   AFIB   Patient is currently rate controlled. Office heart rates are  Pulse Readings from Last 3 Encounters:  06/19/20 68  03/28/20 (!) 59  03/22/20 70   CHA2DS2-VASc Score = 5  The patient's score is based upon: CHF History: Yes HTN History: Yes Diabetes History: Yes Stroke History: No Vascular Disease History: Yes  Patient has failed these meds in past: amiodarone Patient is currently controlled on the following medications:  Marland Kitchen Metoprolol tartrate 25 mg - 1/2 tablet BID . Xarelto 20 mg daily  We discussed:  diet and exercise extensively; Xarelto is >$350 for 90 day supply, patient can get get it for $85/month through [employer]. Pt may be stopping Xarelto over the next year given successful ablation and maintenance of NSR.  Plan  Continue current medications and control with diet and exercise  Hyperlipidemia   LDL goal < 70  Lipid Panel     Component Value Date/Time   CHOL 148 05/29/2019 1430   CHOL 105 03/11/2017 1541   TRIG 255.0 (H) 05/29/2019 1430   HDL 34.00 (L) 05/29/2019 1430   HDL 36 (L) 03/11/2017 1541   LDLCALC 48 06/01/2018 1328   LDLCALC 46 03/11/2017 1541    LDLDIRECT 78.0 05/29/2019 1430    Hepatic Function Latest Ref Rng & Units 03/20/2020 10/12/2019 06/01/2018  Total Protein 6.5 - 8.1 g/dL 6.2(L) 7.0 7.1  Albumin 3.5 - 5.0 g/dL 4.0 4.3 4.6  AST 15 - 41 U/L _0 ALT 0 - 44 U/L _1 Alk Phosphatase 38 - 126 U/L 84 88 91  Total Bilirubin 0.3 - 1.2 mg/dL 0.9 0.9 0.9  Bilirubin, Direct 0.00 - 0.40 mg/dL - - -     The 10-year ASCVD risk score Mikey Bussing DC Jr., et al., 2013) is: 35.5%   Values used to calculate the score:     Age: 77 years     Sex: Male     Is Non-Hispanic African American: No     Diabetic: Yes     Tobacco smoker: No     Systolic Blood Pressure: 329 mmHg     Is BP treated: Yes     HDL Cholesterol: 34 mg/dL     Total Cholesterol: 148 mg/dL   Patient has failed these meds in past: n/a Patient is currently controlled on the following medications:  . Atorvastatin 20 mg daily  We discussed:  diet and exercise extensively; cholesterol goals; benefits of statin for ASCVD risk reduction  Plan  Continue current  medications and control with diet and exercise  Diabetes   A1c goal <7%  Recent Relevant Labs: Lab Results  Component Value Date/Time   HGBA1C 6.5 (H) 03/21/2020 05:22 PM   HGBA1C 6.3 (H) 10/12/2019 11:45 AM   GFR 69.38 09/06/2019 11:21 AM   GFR 66.51 05/29/2019 02:30 PM   MICROALBUR 0.9 05/29/2019 02:30 PM   MICROALBUR 4.6 (H) 06/01/2018 01:35 PM    Last diabetic Eye exam:  Lab Results  Component Value Date/Time   HMDIABEYEEXA No Retinopathy 03/23/2019 12:00 AM    Last diabetic Foot exam: No results found for: HMDIABFOOTEX   Checking BG: Never   Patient has failed these meds in past: n/a Patient is currently controlled on the following medications: . Synjardy XR (empagliflozin-metformin) 25-1000 mg daily  We discussed: diet and exercise extensively; walks 2-1/2 miles every day, tries to eat healthy. Pt reports Synjardy is $400 per 90 days. He also reports his income is less than the [employer]  limit so he should qualify.  Plan  Continue current medications and control with diet and exercise  Pursue Synjardy PAP via [employer]  Chronic Bronchitis   Last spirometry score:  10/13/2019: FEV1 81% predicted; FEV1/FVC 0.76  Gold Grade: Gold 1 (FEV1>80%) Current COPD Classification:  A (low sx, <2 exacerbations/yr)  Lab Results  Component Value Date/Time   EOSPCT 5 03/20/2020 02:18 PM   EOSABS 0.4 03/20/2020 02:18 PM   EOSABS 0.4 01/25/2019 01:58 PM   Patient has failed these meds in past: n/a Patient is currently controlled on the following medications:  Spiriva Respimat - not taking for several months  Using maintenance inhaler regularly? No Frequency of rescue inhaler use:  never  We discussed:  Pt reports SOB has vastly improved since Afib ablation; he denies SOB, wheezing and believes he does not need an inhaler right now.  Plan  Continue to monitor  BPH   Lab Results  Component Value Date/Time   PSA 4.380 11/14/2019 12:00 AM   PSA 4.75 (H) 05/29/2019 02:30 PM   PSA 0.94 11/01/2017 12:00 AM   Patient has failed these meds in past: finasteride Patient is currently uncontrolled on the following medications:   Alfuzosin 10 mg daily AM  We discussed:  Pt reports his urinary symptoms have not improved since taking alfuzosin; he reports they did improve with finasteride but he could not tolerate the side effects. Pt sees a urologist every 6 months, encouraged him to discuss at his next visit  Plan  Continue current medications  Follow up with urologist as scheduled  Insomnia   Patient has failed these meds in past: n/a Patient is currently controlled on the following medications:  . Triazolam 0.25 mg HS prn  We discussed: Pt reports he sleeps fine with the med, when he doesn't take it he has a restless night sleep. He takes it 4-5 nights per week.  Plan  Continue current medications   Medication Management   Pt uses [employer], [place] for all  medications Uses pill box? Yes Pt endorses 100% compliance  We discussed: Pt fills triazolam at [facility] because it is not covered by insurance and is cheaper there; he would like other meds to be filled via mail order if possible.  Plan  Continue current medication management strategy    Follow up: *** month phone visit  Charlene Brooke, PharmD, BCACP Clinical Pharmacist [facility] [phone]

## 2020-07-10 ENCOUNTER — Ambulatory Visit: Payer: Self-pay | Admitting: Pharmacist

## 2020-07-10 DIAGNOSIS — E118 Type 2 diabetes mellitus with unspecified complications: Secondary | ICD-10-CM

## 2020-07-10 NOTE — Progress Notes (Signed)
Received BI patient assistance paperwork from patient. Completed application for Synjardy XR and faxed to [facility] at [phone]. Will await determination.

## 2020-07-15 ENCOUNTER — Ambulatory Visit
Admission: RE | Admit: 2020-07-15 | Discharge: 2020-07-15 | Disposition: A | Discharge status: Home / Self Care | Payer: Medicare HMO | Source: Ambulatory Visit | Attending: Cardiothoracic Surgery | Admitting: Cardiothoracic Surgery

## 2020-07-15 DIAGNOSIS — I2510 Atherosclerotic heart disease of native coronary artery without angina pectoris: Secondary | ICD-10-CM | POA: Diagnosis not present

## 2020-07-15 DIAGNOSIS — Q231 Congenital insufficiency of aortic valve: Secondary | ICD-10-CM | POA: Diagnosis not present

## 2020-07-15 DIAGNOSIS — I712 Thoracic aortic aneurysm, without rupture, unspecified: Secondary | ICD-10-CM

## 2020-07-15 DIAGNOSIS — I728 Aneurysm of other specified arteries: Secondary | ICD-10-CM | POA: Diagnosis not present

## 2020-07-15 IMAGING — CT CT ANGIO CHEST
2 of 6 series · 13 of 36 positions shown · IV contrast (iopamidol)
Comparison: [DATE] and [DATE].

CLINICAL DATA: History of aortic valve replacement, replacement of
ascending thoracic aorta and Maze procedure on [DATE] to treat
aortic valve insufficiency and aneurysmal disease of the ascending
thoracic aorta.

EXAM:
CT ANGIOGRAPHY CHEST WITH CONTRAST
TECHNIQUE: Multidetector CT imaging of the chest was performed using the
standard protocol during bolus administration of intravenous
contrast. Multiplanar CT image reconstructions and MIPs were
obtained to evaluate the vascular anatomy.
CONTRAST:  75mL [MA] IOPAMIDOL ([MA]) INJECTION 76%

[Series 5: cta thorax 2.00 bv36 s3 axial arterial · axial · arterial · 0.70mm/px · z∈[+1573,+1857]mm · 12 of 168 slices shown]
[im 13/168  lung]
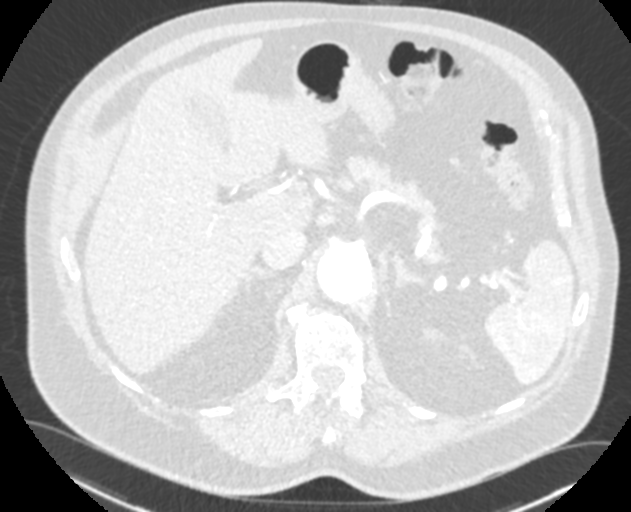
[im 26/168  mediastinal]
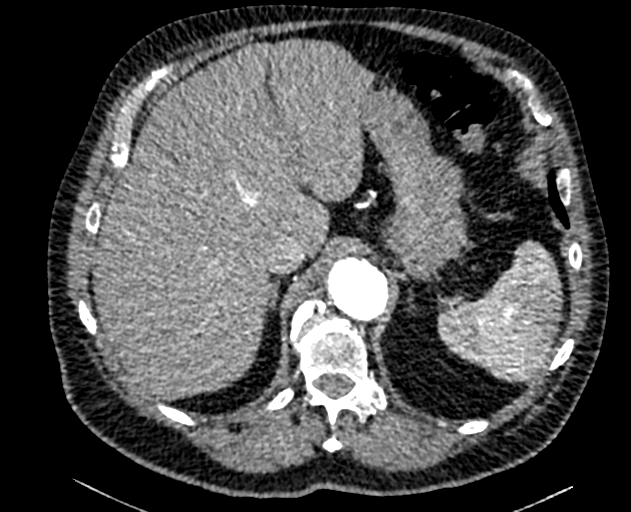
[im 39/168  lung]
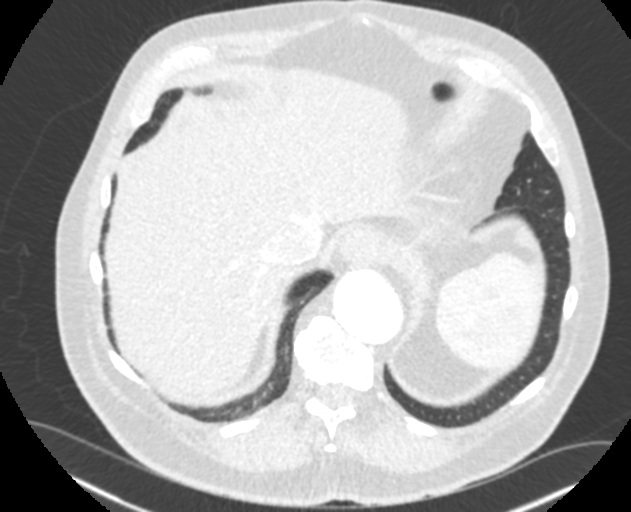
[im 52/168  mediastinal]
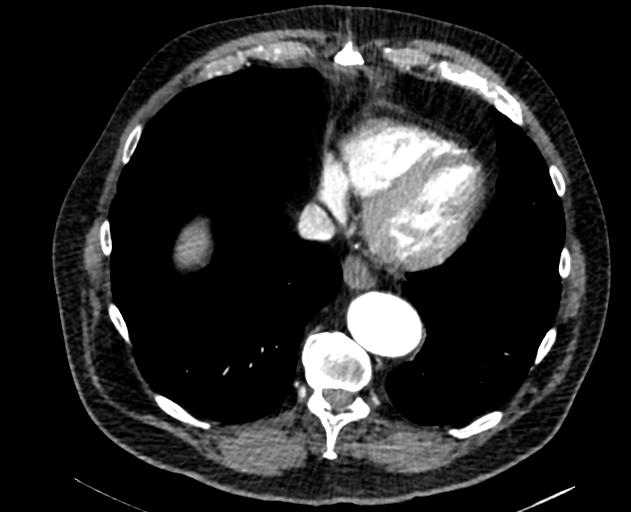
[im 65/168  lung]
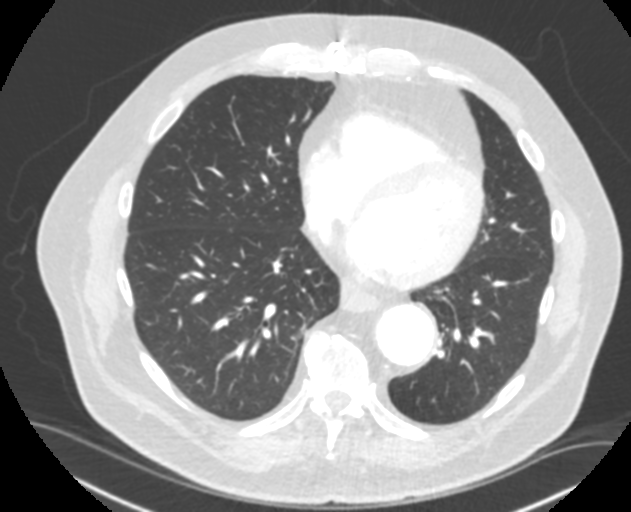
[im 78/168  mediastinal]
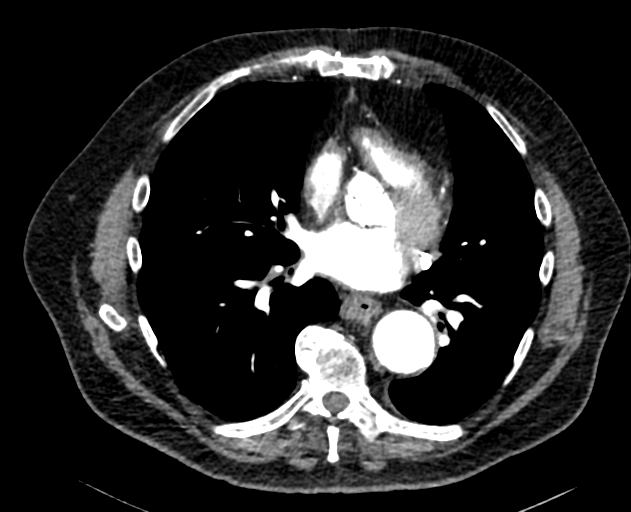
[im 90/168  lung]
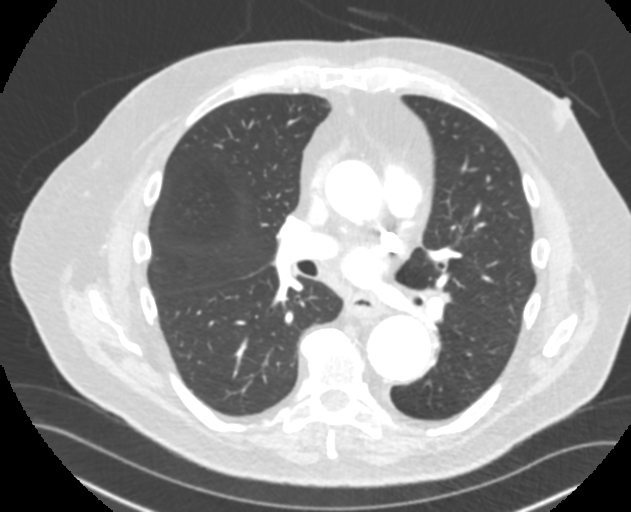
[im 103/168  mediastinal]
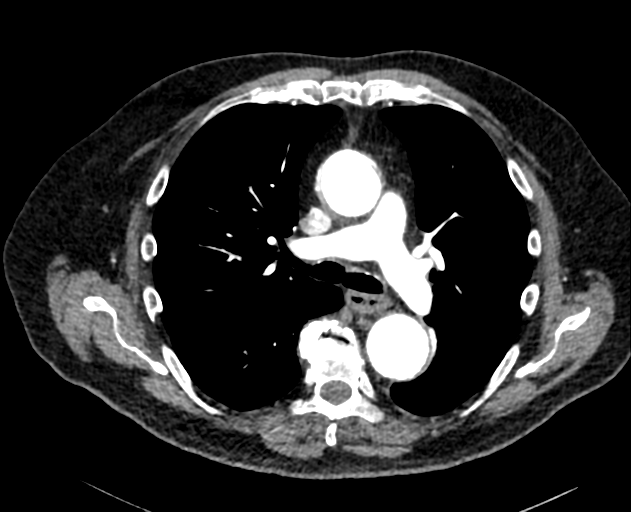
[im 116/168  lung]
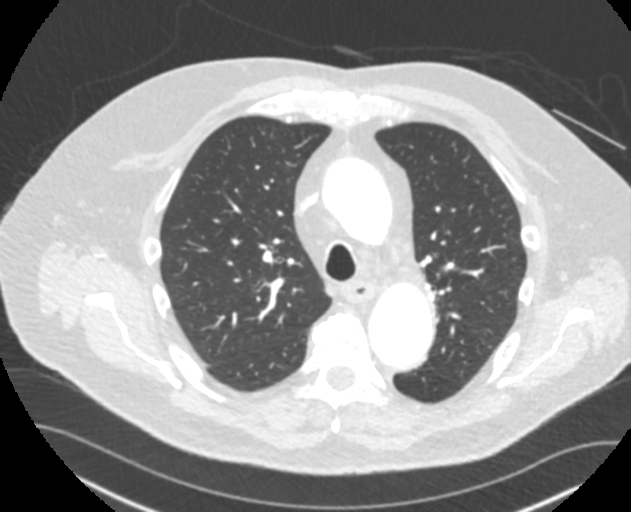
[im 129/168  mediastinal]
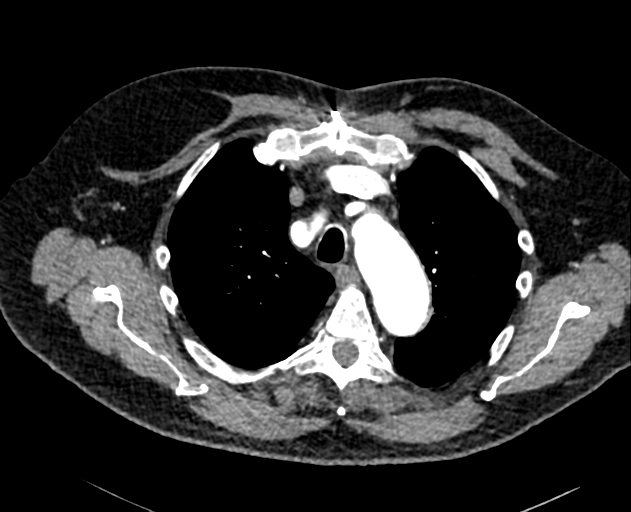
[im 142/168  lung]
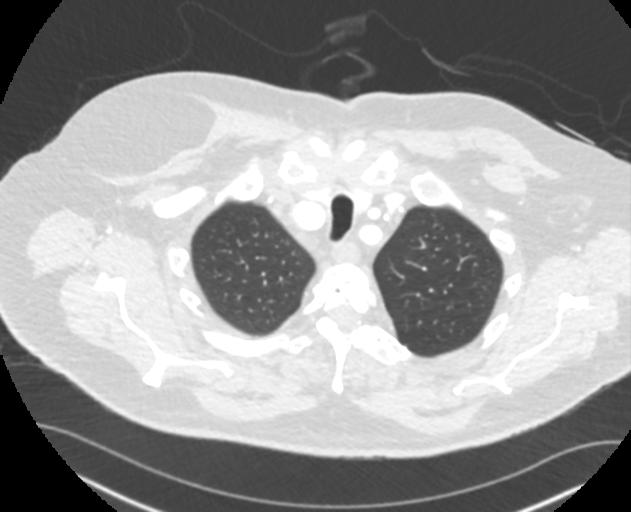
[im 155/168  mediastinal]
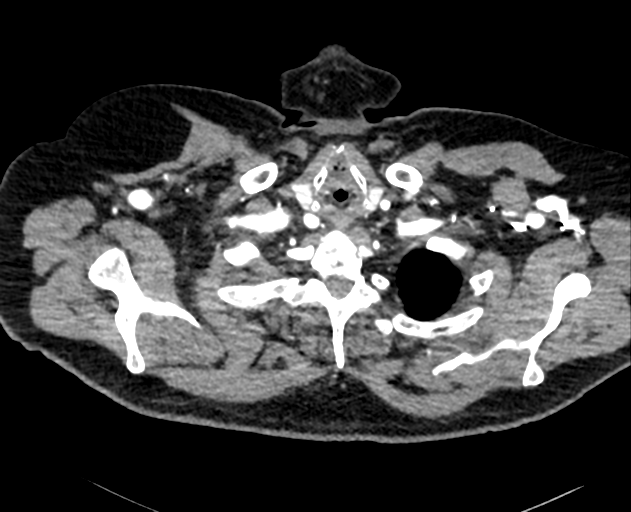

[Series 10: cta thorax 2.00 bv36 s3 cor st · coronal · 0.66mm/px · 1 of 178 slices shown]
[im 89/178  mediastinal]
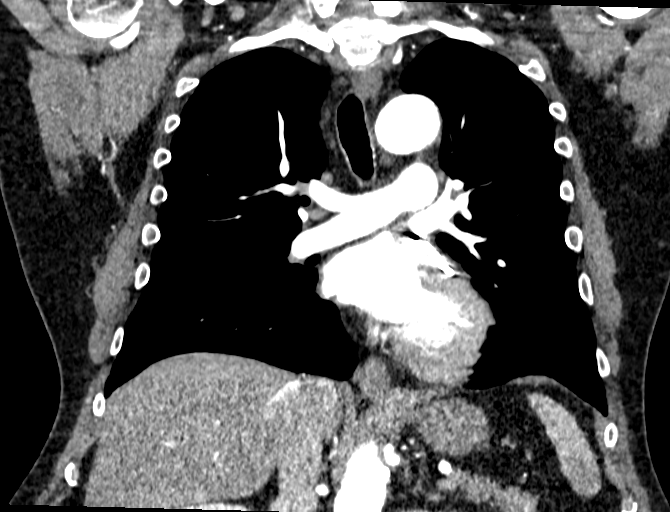

[13 of 36 positions shown; findings below may reference images not displayed]

FINDINGS: Cardiovascular: Stable postoperative appearance of prosthetic aortic
valve and ascending aortic graft without evidence of for anastomotic
pseudoaneurysm or other complication. They of aorta distal to the
graft shows stable aneurysmal dilatation with the distal ascending
thoracic aorta measuring up to 4.4 cm, the distal arch measuring 4
cm in the descending thoracic aorta measuring 3.9-4.3 cm. No
evidence of aortic dissection stable patency of proximal great
vessels without significant stenosis. Stable dilated proximal right
subclavian artery measuring 2 cm.

The heart size is stable and within normal limits. No pericardial
fluid identified. Normal caliber of central pulmonary arteries.
Stable calcified plaque in the distribution of the proximal LAD.

Mediastinum/Nodes: No enlarged mediastinal, hilar, or axillary lymph
nodes. Thyroid gland, trachea, and esophagus demonstrate no
significant findings.

Lungs/Pleura: There is no evidence of pulmonary edema,
consolidation, pneumothorax, nodule or pleural fluid.

Upper Abdomen: No acute abnormality.

Musculoskeletal: No chest wall abnormality. No acute or significant
osseous findings.

Review of the MIP images confirms the above findings.
IMPRESSION: 1. Stable postoperative appearance of prosthetic aortic valve and
ascending aortic graft without evidence of anastomotic
pseudoaneurysm or other complication.
2. Stable aneurysmal dilatation of the native distal ascending
thoracic aorta measuring up to 4.4 cm, the distal arch measuring 4
cm and the descending thoracic aorta measuring 3.9-4.3 cm.
3. Stable dilated proximal right subclavian artery measuring 2 cm.
4. Coronary artery disease with calcified plaque in the distribution
of the proximal LAD.

## 2020-07-15 MED ORDER — IOPAMIDOL (ISOVUE-370) INJECTION 76%
75.0000 mL | Freq: Once | INTRAVENOUS | Status: AC | PRN
  Administered 2020-07-15: 75 mL via INTRAVENOUS

## 2020-07-18 ENCOUNTER — Other Ambulatory Visit: Payer: Self-pay

## 2020-07-18 ENCOUNTER — Ambulatory Visit: Payer: Medicare HMO | Admitting: Cardiothoracic Surgery

## 2020-07-18 VITALS — BP 124/79 | HR 58 | Resp 20 | Ht 77.0 in | Wt 275.0 lb

## 2020-07-18 DIAGNOSIS — Z95828 Presence of other vascular implants and grafts: Secondary | ICD-10-CM | POA: Diagnosis not present

## 2020-07-18 NOTE — Progress Notes (Signed)
Clinton Black [address]       [place] [address]             [phone]      Clinton Black [place] Medical Record #[id number] Date of Birth: 01-04-1946  Referring: [name], MD Primary Care: [name], MD Primary Cardiologist: [name], MD   Chief Complaint:   POST OP FOLLOW UP OPERATIVE REPORT DATE OF PROCEDURE:  10/16/2019 PREOPERATIVE DIAGNOSES:   1.  Severe aortic insufficiency. 2.  Dilated ascending aorta.   3.  History of atrial fibrillation. POSTOPERATIVE DIAGNOSES:   1.  Severe aortic insufficiency. 2.  Dilated ascending aorta.   3.  History of atrial fibrillation. SURGICAL PROCEDURES:   1.  Replacement of aortic valve with 25 mm pericardial tissue valve, [employer], model 3300TFX, serial [id number]. 2.  Supracoronary replacement of ascending aorta with 34 mm Hemashield platinum graft. 3.  Modified maze procedure with bilateral pulmonary vein isolation. 4.  Placement of a 50 mm AtriCure AtriClip, left atrial appendage.  5.  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   History of Present Illness:     Patient returns office today in follow-up after aortic valve replacement and replacement of ascending aorta with maze procedure and atrial clip.  This was done in May 2021.  Since that time the patient is unaware of any further atrial fibrillation.  His overall physical stamina has continued to improve.  Notes his chronic venous stasis ulcer especially in the left lower extremity is also almost completely healed/under control with current therapy      Past Medical History:  Diagnosis Date  . Arthritis   . Atrial fibrillation ([place])   . BPH (benign prostatic hyperplasia)   . Diabetes mellitus type 2 in obese (HCC)    diet controlled  . HTN (hypertension)   . Hyperlipidemia, mixed   . Insomnia   . Obesity   . Thoracic aortic aneurysm, without rupture (HCC)    4.7 cm per chest ct with contrast  11-04-17     Social History   Tobacco Use  Smoking Status Former Smoker  . Packs/day: 1.50  . Years: 5.00  . Pack years: 7.50  . Types: Cigarettes  . Quit date: 06/15/1994  . Years since quitting: 26.1  Smokeless Tobacco Never Used    Social History   Substance and Sexual Activity  Alcohol Use Not Currently  . Alcohol/week: 14.0 standard drinks  . Types: 14 Shots of liquor per week   Comment: 1 drink of liquor per day     Allergies  Allergen Reactions  . Quinolones Other (See Comments)    Patient was warned about not using Cipro and similar antibiotics. Recent studies have raised concern that fluoroquinolone antibiotics could be associated with an increased risk of aortic aneurysm Fluoroquinolones have non-antimicrobial properties that might jeopardise the integrity of the extracellular matrix of the vascular wall In a  propensity score matched cohort study in [place], there was a 66% increased rate of aortic aneurysm or dissection associated with oral fluoroquinolone use, compared wit    Current Outpatient Medications  Medication Sig Dispense Refill  . atorvastatin (LIPITOR) 20 MG tablet TAKE 1 TABLET BY MOUTH EVERYDAY AT BEDTIME (Patient taking differently: Take 20 mg by mouth at bedtime.) 90 tablet 1  . Empagliflozin-metFORMIN HCl ER (SYNJARDY XR) 25-1000 MG TB24 Take 1 tablet by mouth daily. 90 tablet 1  . losartan (COZAAR) 25 MG tablet TAKE  2 TABLETS EVERY DAY 180 tablet 3  . metoprolol tartrate (LOPRESSOR) 25 MG tablet Take 0.5 tablets (12.5 mg total) by mouth 2 (two) times daily. 90 tablet 1  . rivaroxaban (XARELTO) 20 MG TABS tablet Take 1 tablet (20 mg total) by mouth daily with supper. 30 tablet 5  . triazolam (HALCION) 0.25 MG tablet TAKE 1 TABLET BY MOUTH AT BEDTIME AS NEEDED FOR SLEEP. 30 tablet 3  . alfuzosin (UROXATRAL) 10 MG 24 hr tablet Take 1 tablet (10 mg total) by mouth daily with breakfast. (Patient not taking: Reported on 07/18/2020) 90 tablet 1  .  Menthol-Methyl Salicylate (SALONPAS PAIN RELIEF PATCH EX) Apply 1 patch topically daily as needed (back pain).  (Patient not taking: Reported on 07/18/2020)     No current facility-administered medications for this visit.       Physical Exam: BP 124/79 (BP Location: Right Arm, Patient Position: Sitting)   Pulse (!) 58   Resp 20   Ht 6\' 5"  (1.956 m)   Wt 275 lb (124.7 kg)   SpO2 96% Comment: RA with mask on  BMI 32.61 kg/m   General appearance: alert, cooperative, appears stated age and no distress Neck: no adenopathy, no carotid bruit, no JVD, supple, symmetrical, trachea midline and thyroid not enlarged, symmetric, no tenderness/mass/nodules Resp: clear to auscultation bilaterally Cardio: regular rate and rhythm, S1, S2 normal, no murmur, click, rub or gallop GI: soft, non-tender; bowel sounds normal; no masses,  no organomegaly Extremities: extremities with chronic venous stasis changes especially with a 4 cm healed ulcerated area left lower leg, atraumatic, no cyanosis or edema Neurologic: Grossly normal   Diagnostic Studies & Laboratory data:     Recent Radiology Findings:    CT ANGIO CHEST AORTA W/CM & OR WO/CM  Result Date: 07/15/2020 CLINICAL DATA:  History of aortic valve replacement, replacement of ascending thoracic aorta and 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 EXAM: CT ANGIOGRAPHY CHEST WITH CONTRAST TECHNIQUE: Multidetector CT imaging of the chest was performed using the standard protocol during bolus administration of intravenous contrast. Multiplanar CT image reconstructions and MIPs were obtained to evaluate the vascular anatomy. CONTRAST:  3mL ISOVUE-370 IOPAMIDOL (ISOVUE-370) INJECTION 76% COMPARISON:  03/20/2020 and 09/14/2019. FINDINGS: Cardiovascular: Stable postoperative appearance of prosthetic aortic valve and ascending aortic graft without evidence of for anastomotic  pseudoaneurysm or other complication. They of aorta distal to the graft shows stable aneurysmal dilatation with the distal ascending thoracic aorta measuring up to 4.4 cm, the distal arch measuring 4 cm in the descending thoracic aorta measuring 3.9-4.3 cm. No evidence of aortic dissection stable patency of proximal great vessels without significant stenosis. 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 The heart size is stable and within normal limits. No pericardial fluid identified. Normal caliber of central pulmonary arteries. Stable calcified plaque in the distribution of the proximal LAD. Mediastinum/Nodes: No enlarged mediastinal, hilar, or axillary lymph nodes. Thyroid gland, trachea, and esophagus demonstrate no significant findings. Lungs/Pleura: There is no evidence of pulmonary edema, consolidation, pneumothorax, nodule or pleural fluid. Upper Abdomen: No acute abnormality. Musculoskeletal: No chest wall abnormality. No acute or significant osseous findings. Review of the MIP images confirms the above findings. IMPRESSION: 1. Stable postoperative appearance of prosthetic aortic valve and ascending aortic graft without evidence of anastomotic pseudoaneurysm or other complication. 2. Stable aneurysmal dilatation of the native distal ascending thoracic aorta measuring up to 4.4 cm, the distal arch measuring 4  cm and the descending thoracic aorta measuring 3.9-4.3 cm. 3. 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 4. Coronary artery disease with calcified plaque in the distribution of the proximal LAD. Electronically Signed   By: Aletta Edouard M.D.   On: 07/15/2020 13:09     I have independently reviewed the above radiology studies  and reviewed the findings with the patient.    Recent Lab Findings: Lab Results  Component Value Date   WBC 9.4 03/21/2020   HGB 13.6 03/21/2020   HCT 43.8 03/21/2020   PLT 196 03/21/2020   GLUCOSE 114 (H) 03/22/2020   CHOL  148 05/29/2019   TRIG 255.0 (H) 05/29/2019   HDL 34.00 (L) 05/29/2019   LDLDIRECT 78.0 05/29/2019   LDLCALC 48 06/01/2018   ALT 17 03/20/2020   AST 17 03/20/2020   NA 140 03/22/2020   K 3.8 03/22/2020   CL 109 03/22/2020   CREATININE 0.87 03/22/2020   BUN 11 03/22/2020   CO2 22 03/22/2020   TSH 4.29 09/06/2019   INR 1.5 (H) 03/20/2020   HGBA1C 6.5 (H) 03/21/2020       Assessment / Plan:   #1 status post aortic valve replacement, replacement of ascending aorta, maze procedure with pulmonary vein isolation, atrial clip-for significant aortic insufficiency.  Overall the patient is doing well following surgery.  Symptomatically he is much improved.  Patient appears to be in sinus rhythm on exam today.  CTA of the chest shows stable postop appearance.   I am not made the patient a follow-up appointment surgical office, is followed closely by cardiology with suggested follow-up CT a of the chest between 18 and 24 months from now-he can be referred to surgery if there are any significant changes  Medication Changes: No orders of the defined types were placed in this encounter.     [name] MD      [place] [place] [address] Office [phone]     07/18/2020 4:02 PM

## 2020-08-21 ENCOUNTER — Ambulatory Visit: Payer: Self-pay

## 2020-08-21 ENCOUNTER — Ambulatory Visit (INDEPENDENT_AMBULATORY_CARE_PROVIDER_SITE_OTHER): Payer: Medicare HMO | Admitting: Orthopaedic Surgery

## 2020-08-21 ENCOUNTER — Encounter: Payer: Self-pay | Admitting: Orthopaedic Surgery

## 2020-08-21 ENCOUNTER — Other Ambulatory Visit: Payer: Self-pay

## 2020-08-21 VITALS — BP 120/67 | HR 63 | Ht 77.0 in | Wt 275.0 lb

## 2020-08-21 DIAGNOSIS — G8929 Other chronic pain: Secondary | ICD-10-CM | POA: Diagnosis not present

## 2020-08-21 DIAGNOSIS — M25561 Pain in right knee: Secondary | ICD-10-CM

## 2020-08-21 NOTE — Progress Notes (Signed)
Office Visit Note   Patient: Clinton Black           Date of Birth: [birth year]           MRN: [id number] Visit Date: 08/21/2020              Requested by: [name], MD [address] [place],  [place] [address] PCP: [name], MD   Assessment & Plan: Visit Diagnoses:  1. Chronic pain of right knee     Plan: Patient is doing well from his maze procedure and valve replacement.  We will check him again in 6 months.  Currently the symptoms are not severe enough to consider surgical treatment.  We reviewed x-rays with him he does have bone-on-bone changes but continues to ambulate well.  Follow-Up Instructions: Return in about 6 months (around 02/21/2021).   Orders:  Orders Placed This Encounter  Procedures  . XR KNEE 3 VIEW RIGHT   No orders of the defined types were placed in this encounter.     Procedures: No procedures performed   Clinical Data: No additional findings.   Subjective: Chief Complaint  Patient presents with  . Right Knee - Pain    HPI 75 year old male seen with progressive right knee pain.  Last seen 2019 he states his knee feels tight swollen painful with a lot of walking.  He has more pain medially than laterally.  Occasionally has had catching no true locking.  He is not taking any medication.  He elevates his knee when he can.  When his knee is more painful later during the day he is walking with a limp.  Review of Systems possible type 2 diabetes chronic diastolic heart failure claudication lower extremities, previous ascending aorta replacement.  10/16/2019 surgery by Dr. Servando Snare with aortic valve replacement, modified maze procedure atrial cure atrial clip with cardiopulmonary bypass.   Objective: Vital Signs: BP 120/67   Pulse 63   Ht 6\' 5"  (1.956 m)   Wt 275 lb (124.7 kg)   BMI 32.61 kg/m   Physical Exam Constitutional:      Appearance: He is well-developed.  HENT:     Head: Normocephalic and atraumatic.  Eyes:      Pupils: Pupils are equal, round, and reactive to light.  Neck:     Thyroid: No thyromegaly.     Trachea: No tracheal deviation.  Cardiovascular:     Rate and Rhythm: Normal rate.  Pulmonary:     Effort: Pulmonary effort is normal.     Breath sounds: No wheezing.  Abdominal:     General: Bowel sounds are normal.     Palpations: Abdomen is soft.  Skin:    General: Skin is warm and dry.     Capillary Refill: Capillary refill takes less than 2 seconds.  Neurological:     Mental Status: He is alert and oriented to person, place, and time.  Psychiatric:        Behavior: Behavior normal.        Thought Content: Thought content normal.        Judgment: Judgment normal.     Ortho Exam healed sternotomy.  Patient is amatory with trace right knee limp he can get from sitting to standing easily on and off the exam table.  Knee reaches full extension varus noted on the right knee.  Left knee straight.  He can flex 110 degrees.  Medial more than lateral joint line tenderness since moderate crepitus with  knee range of motion. Specialty Comments:  No specialty comments available.  Imaging: No results found.   PMFS History: Patient Active Problem List   Diagnosis Date Noted  . DDD (degenerative disc disease), cervical 03/28/2020  . DDD (degenerative disc disease), lumbar 03/28/2020  . Syncope and collapse 03/20/2020  . 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 BRBPR (bright red blood per rectum) 09/06/2019  . Mucopurulent chronic bronchitis ([place]) 05/30/2019  . Benign prostatic hyperplasia without lower urinary tract symptoms 05/29/2019  . BPH with elevated PSA 05/29/2019  . Thoracic aortic aneurysm ([place]) 01/30/2019  . TSH elevation 10/22/2018  . Persistent atrial fibrillation ([place]) 06/01/2018  . Claudication of both lower extremities ([place]) 07/15/2017  . Chronic diastolic CHF (congestive heart failure), NYHA class 2 ([place]) 07/06/2017  . Mild tetrahydrocannabinol (THC) abuse  12/30/2016  . Essential hypertension 12/24/2016  . Hyperlipidemia LDL goal <130 05/11/2016  . Type 2 diabetes mellitus with complication, without long-term current use of insulin ([place]) 05/11/2016  . Routine general medical examination at a health care facility 10/06/2013  . COLONIC POLYPS 05/28/2009  . Obesity, morbid ([facility]) 05/28/2009  . 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 DEGENERATIVE JOINT DISEASE 05/28/2009  . Insomnia w/ sleep apnea 05/28/2009   Past Medical History:  Diagnosis Date  . Arthritis   . Atrial fibrillation ([place])   . BPH (benign prostatic hyperplasia)   . Diabetes mellitus type 2 in obese ([facility])    diet controlled  . HTN (hypertension)   . Hyperlipidemia, mixed   . Insomnia   . Obesity   . Thoracic aortic aneurysm, without rupture ([facility])    4.7 cm per chest ct with contrast 11-04-17    Family History  Problem Relation Age of Onset  . Aneurysm Father   . Heart disease Father   . Varicose Veins Mother   . Arthritis Mother   . Macular degeneration Mother   . Cancer Neg Hx   . Colon cancer Neg Hx   . Esophageal cancer Neg Hx   . Rectal cancer Neg Hx   . Stomach cancer Neg Hx     Past Surgical History:  Procedure Laterality Date  . AORTIC VALVE REPLACEMENT N/A 10/16/2019   Procedure: AORTIC VALVE REPLACEMENT (AVR) using Edwards PERIMOUNT Magna Ease 25MM Bioprosthesis Aortic Valve.;  Surgeon: [name], MD;  Location: [facility];  Service: Open Heart Surgery;  Laterality: N/A;  Right subclavian artery cannulation.  . AORTIC VALVE REPLACEMENT  10/16/2019   AORTIC VALVE REPLACEMENT (AVR)   . ATRIAL FIBRILLATION ABLATION N/A 04/07/2019   Procedure: ATRIAL FIBRILLATION ABLATION;  Surgeon: [name], MD;  Location: [facility];  Service: Cardiovascular;  Laterality: N/A;  . CARDIOVERSION N/A 07/12/2018   Procedure: CARDIOVERSION;  Surgeon: Thayer Headings, MD;  Location[facility];  Service: Cardiovascular;  Laterality: N/A;  .  CARDIOVERSION N/A 08/22/2018   Procedure: CARDIOVERSION;  Surgeon: Skeet Latch, MD;  Location: [facility];  Service: Cardiovascular;  Laterality: N/A;  . CARDIOVERSION N/A 06/28/2019   Procedure: CARDIOVERSION;  Surgeon: Thayer Headings, MD;  Location: [facility];  Service: Cardiovascular;  Laterality: N/A;  . CLIPPING OF ATRIAL APPENDAGE N/A 10/16/2019   Procedure: Clipping Of Atrial Appendage using AtriCure AtriClip Exclusion VLAA 44mm.;  Surgeon: [name], MD;  Location: [facility];  Service: Open Heart Surgery;  Laterality: N/A;  . COLONOSCOPY  2016  . CYST EXCISION     polynomial  . CYSTOSCOPY WITH INSERTION OF UROLIFT N/A 04/18/2018  Procedure: CYSTOSCOPY WITH INSERTION OF UROLIFT;  Surgeon: Cleon Gustin, MD;  Location[facility];  Service: Urology;  Laterality: N/A;  . KNEE SURGERY Right 2000   arthroscopy  . MAZE N/A 10/16/2019   Procedure: MAZE with bilateral pulmonary vein isolation.;  Surgeon: [name], MD;  Location: [facility];  Service: Open Heart Surgery;  Laterality: N/A;  Maze procedure using Atricure OLL2 Isolator clamp.  . REPLACEMENT ASCENDING AORTA N/A 10/16/2019   Procedure: SUPRA CORONARY REPLACEMENT OF ASCENDING AORTA using Maquet Hemashiel Platinum 34 MM Vascular Graft.;  Surgeon: [name], MD;  Location: [facility];  Service: Open Heart Surgery;  Laterality: N/A;  Right subclavian artery cannulation.  Marland Kitchen RIGHT/LEFT HEART CATH AND CORONARY ANGIOGRAPHY N/A 01/30/2019   Procedure: RIGHT/LEFT HEART CATH AND CORONARY ANGIOGRAPHY;  Surgeon: [name], [name], MD;  Location: [facility];  Service: Cardiovascular;  Laterality: N/A;  . TEE WITHOUT CARDIOVERSION N/A 10/16/2019   Procedure: TRANSESOPHAGEAL ECHOCARDIOGRAM (TEE);  Surgeon: [name], MD;  Location: [facility];  Service: Open Heart Surgery;  Laterality: N/A;  . TONSILLECTOMY    . widson teeth extraction     Social History   Occupational History  . Not on file   Tobacco Use  . Smoking status: Former Smoker    Packs/day: 1.50    Years: 5.00    Pack years: 7.50    Types: Cigarettes    Quit date: 06/15/1994    Years since quitting: 26.2  . Smokeless tobacco: Never Used  Vaping Use  . Vaping Use: Never used  Substance and Sexual Activity  . Alcohol use: Not Currently    Alcohol/week: 14.0 standard drinks    Types: 14 Shots of liquor per week    Comment: 1 drink of liquor per day  . Drug use: Yes    Types: Marijuana    Comment: occasionally marijuana  . Sexual activity: Yes    Partners: Female    Birth control/protection: Condom    Comment: condom use most of the time but not always

## 2020-08-26 ENCOUNTER — Other Ambulatory Visit: Payer: Self-pay | Admitting: Internal Medicine

## 2020-08-26 DIAGNOSIS — E785 Hyperlipidemia, unspecified: Secondary | ICD-10-CM

## 2020-08-26 DIAGNOSIS — E118 Type 2 diabetes mellitus with unspecified complications: Secondary | ICD-10-CM

## 2020-08-28 LAB — HM DIABETES EYE EXAM

## 2020-09-02 ENCOUNTER — Ambulatory Visit (INDEPENDENT_AMBULATORY_CARE_PROVIDER_SITE_OTHER): Payer: Medicare HMO | Admitting: Internal Medicine

## 2020-09-02 ENCOUNTER — Ambulatory Visit (INDEPENDENT_AMBULATORY_CARE_PROVIDER_SITE_OTHER): Payer: Medicare HMO

## 2020-09-02 ENCOUNTER — Encounter: Payer: Self-pay | Admitting: Internal Medicine

## 2020-09-02 ENCOUNTER — Other Ambulatory Visit: Payer: Self-pay

## 2020-09-02 VITALS — BP 140/86 | HR 74 | Temp 98.0°F | Resp 16 | Ht 77.0 in | Wt 262.0 lb

## 2020-09-02 VITALS — BP 118/70 | HR 74 | Temp 98.0°F | Ht 77.0 in | Wt 262.0 lb

## 2020-09-02 DIAGNOSIS — E118 Type 2 diabetes mellitus with unspecified complications: Secondary | ICD-10-CM | POA: Diagnosis not present

## 2020-09-02 DIAGNOSIS — R7989 Other specified abnormal findings of blood chemistry: Secondary | ICD-10-CM

## 2020-09-02 DIAGNOSIS — I480 Paroxysmal atrial fibrillation: Secondary | ICD-10-CM | POA: Diagnosis not present

## 2020-09-02 DIAGNOSIS — Z0000 Encounter for general adult medical examination without abnormal findings: Secondary | ICD-10-CM

## 2020-09-02 DIAGNOSIS — N400 Enlarged prostate without lower urinary tract symptoms: Secondary | ICD-10-CM | POA: Diagnosis not present

## 2020-09-02 DIAGNOSIS — I4891 Unspecified atrial fibrillation: Secondary | ICD-10-CM

## 2020-09-02 DIAGNOSIS — I5032 Chronic diastolic (congestive) heart failure: Secondary | ICD-10-CM

## 2020-09-02 DIAGNOSIS — I10 Essential (primary) hypertension: Secondary | ICD-10-CM

## 2020-09-02 DIAGNOSIS — G4730 Sleep apnea, unspecified: Secondary | ICD-10-CM

## 2020-09-02 DIAGNOSIS — G4700 Insomnia, unspecified: Secondary | ICD-10-CM

## 2020-09-02 DIAGNOSIS — E785 Hyperlipidemia, unspecified: Secondary | ICD-10-CM

## 2020-09-02 DIAGNOSIS — R079 Chest pain, unspecified: Secondary | ICD-10-CM | POA: Diagnosis not present

## 2020-09-02 DIAGNOSIS — R972 Elevated prostate specific antigen [PSA]: Secondary | ICD-10-CM

## 2020-09-02 LAB — CBC WITH DIFFERENTIAL/PLATELET
Basophils Absolute: 0.1 10*3/uL (ref 0.0–0.1)
Basophils Relative: 1 % (ref 0.0–3.0)
Eosinophils Absolute: 0.5 10*3/uL (ref 0.0–0.7)
Eosinophils Relative: 7.1 % — ABNORMAL HIGH (ref 0.0–5.0)
HCT: 42.6 % (ref 39.0–52.0)
Hemoglobin: 14.3 g/dL (ref 13.0–17.0)
Lymphocytes Relative: 21.5 % (ref 12.0–46.0)
Lymphs Abs: 1.6 10*3/uL (ref 0.7–4.0)
MCHC: 33.5 g/dL (ref 30.0–36.0)
MCV: 87.5 fl (ref 78.0–100.0)
Monocytes Absolute: 0.4 10*3/uL (ref 0.1–1.0)
Monocytes Relative: 5.7 % (ref 3.0–12.0)
Neutro Abs: 4.8 10*3/uL (ref 1.4–7.7)
Neutrophils Relative %: 64.7 % (ref 43.0–77.0)
Platelets: 197 10*3/uL (ref 150.0–400.0)
RBC: 4.88 Mil/uL (ref 4.22–5.81)
RDW: 13.7 % (ref 11.5–15.5)
WBC: 7.5 10*3/uL (ref 4.0–10.5)

## 2020-09-02 LAB — URINALYSIS, ROUTINE W REFLEX MICROSCOPIC
Bilirubin Urine: NEGATIVE
Hgb urine dipstick: NEGATIVE
Ketones, ur: NEGATIVE
Leukocytes,Ua: NEGATIVE
Nitrite: NEGATIVE
RBC / HPF: NONE SEEN (ref 0–?)
Specific Gravity, Urine: 1.02 (ref 1.000–1.030)
Total Protein, Urine: NEGATIVE
Urine Glucose: >=1000 — AB
Urobilinogen, UA: 0.2 (ref 0.0–1.0)
pH: 5.5 (ref 5.0–8.0)

## 2020-09-02 LAB — HEPATIC FUNCTION PANEL
ALT: 10 U/L (ref 0–53)
AST: 13 U/L (ref 0–37)
Albumin: 4.6 g/dL (ref 3.5–5.2)
Alkaline Phosphatase: 92 U/L (ref 39–117)
Bilirubin, Direct: 0.2 mg/dL (ref 0.0–0.3)
Total Bilirubin: 0.9 mg/dL (ref 0.2–1.2)
Total Protein: 6.6 g/dL (ref 6.0–8.3)

## 2020-09-02 LAB — BASIC METABOLIC PANEL
BUN: 17 mg/dL (ref 6–23)
CO2: 26 mEq/L (ref 19–32)
Calcium: 9.7 mg/dL (ref 8.4–10.5)
Chloride: 105 mEq/L (ref 96–112)
Creatinine, Ser: 0.97 mg/dL (ref 0.40–1.50)
GFR: 77.6 mL/min (ref >60.00)
Glucose, Bld: 104 mg/dL — ABNORMAL HIGH (ref 70–99)
Potassium: 4.8 mEq/L (ref 3.5–5.1)
Sodium: 142 mEq/L (ref 135–145)

## 2020-09-02 LAB — MICROALBUMIN / CREATININE URINE RATIO
Creatinine,U: 122.7 mg/dL
Microalb Creat Ratio: 1.4 mg/g (ref 0.0–30.0)
Microalb, Ur: 1.7 mg/dL (ref 0.0–1.9)

## 2020-09-02 LAB — LIPID PANEL
Cholesterol: 133 mg/dL (ref 0–200)
HDL: 34.1 mg/dL — ABNORMAL LOW (ref >39.00)
LDL Cholesterol: 69 mg/dL (ref 0–99)
NonHDL: 98.66
Total CHOL/HDL Ratio: 4
Triglycerides: 150 mg/dL — ABNORMAL HIGH (ref 0.0–149.0)
VLDL: 30 mg/dL (ref 0.0–40.0)

## 2020-09-02 LAB — HEMOGLOBIN A1C: Hgb A1c MFr Bld: 6.2 % (ref 4.6–6.5)

## 2020-09-02 LAB — PSA: PSA: 4.47 ng/mL — ABNORMAL HIGH (ref 0.10–4.00)

## 2020-09-02 LAB — TSH: TSH: 1.73 u[IU]/mL (ref 0.35–4.50)

## 2020-09-02 MED ORDER — TRIAZOLAM 0.25 MG PO TABS: 0.2500 mg | ORAL_TABLET | Freq: Every evening | ORAL | 0 refills | Status: DC | PRN

## 2020-09-02 MED ORDER — RIVAROXABAN 20 MG PO TABS: 20.0000 mg | ORAL_TABLET | Freq: Every day | ORAL | 1 refills | Status: DC

## 2020-09-02 NOTE — Progress Notes (Signed)
Subjective:   Clinton Black is a 75 y.o. male who presents for Medicare Annual/Subsequent preventive examination.  Review of Systems    No ROS. Medicare Wellness Visit. Additional risk factors are reflected in social history. Cardiac Risk Factors include: advanced age (>39men, >72 women);diabetes mellitus;dyslipidemia;family history of premature cardiovascular disease;hypertension;male gender;obesity (BMI >30kg/m2)     Objective:    Today's Vitals   09/02/20 1114  BP: 118/70  Pulse: 74  Temp: 98 F (36.7 C)  SpO2: 95%  Weight: 262 lb (118.8 kg)  Height: 6\' 5"  (1.956 m)  PainSc: 0-No pain   Body mass index is 31.07 kg/m.  Advanced Directives 09/02/2020 03/21/2020 10/20/2019 10/12/2019 06/28/2019 04/07/2019 01/30/2019  Does Patient Have a Medical Advance Directive? Yes Yes Yes Yes No No No  Type of Advance Directive - Healthcare Power of [place];Living will [place];Living will - - -  Does patient want to make changes to medical advance directive? No - Patient declined No - Patient declined No - Patient declined - - - -  Copy of [place] in Chart? - Yes - validated most recent copy scanned in chart (See row information) No - copy requested No - copy requested - - -  Would patient like information on creating a medical advance directive? - - - - No - Patient declined No - Patient declined No - Patient declined    Current Medications (verified) Outpatient Encounter Medications as of 09/02/2020  Medication Sig  . atorvastatin (LIPITOR) 20 MG tablet TAKE 1 TABLET AT BEDTIME  . Empagliflozin-metFORMIN HCl ER (SYNJARDY XR) 25-1000 MG TB24 Take 1 tablet by mouth daily.  Marland Kitchen losartan (COZAAR) 25 MG tablet TAKE 2 TABLETS EVERY DAY  . metoprolol tartrate (LOPRESSOR) 25 MG tablet Take 0.5 tablets (12.5 mg total) by mouth 2 (two) times daily.  . rivaroxaban (XARELTO) 20 MG TABS tablet Take 1 tablet (20 mg total) by mouth  daily with supper.  . triazolam (HALCION) 0.25 MG tablet TAKE 1 TABLET BY MOUTH AT BEDTIME AS NEEDED FOR SLEEP.  Marland Kitchen alfuzosin (UROXATRAL) 10 MG 24 hr tablet Take 1 tablet (10 mg total) by mouth daily with breakfast.  . Menthol-Methyl Salicylate (SALONPAS PAIN RELIEF PATCH EX) Apply 1 patch topically daily as needed (back pain).   No facility-administered encounter medications on file as of 09/02/2020.    Allergies (verified) Quinolones   History: Past Medical History:  Diagnosis Date  . Arthritis   . Atrial fibrillation ([place])   . BPH (benign prostatic hyperplasia)   . Diabetes mellitus type 2 in obese (HCC)    diet controlled  . HTN (hypertension)   . Hyperlipidemia, mixed   . Insomnia   . Obesity   . Thoracic aortic aneurysm, without rupture (HCC)    4.7 cm per chest ct with contrast 11-04-17   Past Surgical History:  Procedure Laterality Date  . AORTIC VALVE REPLACEMENT N/A 10/16/2019   Procedure: AORTIC VALVE REPLACEMENT (AVR) using Edwards PERIMOUNT Magna Ease 25MM Bioprosthesis Aortic Valve.;  Surgeon: [name], MD;  Location: [place];  Service: Open Heart Surgery;  Laterality: N/A;  Right subclavian artery cannulation.  . AORTIC VALVE REPLACEMENT  10/16/2019   AORTIC VALVE REPLACEMENT (AVR)   . ATRIAL FIBRILLATION ABLATION N/A 04/07/2019   Procedure: ATRIAL FIBRILLATION ABLATION;  Surgeon: [name], MD;  Location: [place] [facility];  Service: Cardiovascular;  Laterality: N/A;  . CARDIOVERSION N/A 07/12/2018   Procedure: CARDIOVERSION;  Surgeon: Acie Fredrickson Wonda Cheng, MD;  Location: [facility] ENDOSCOPY;  Service: Cardiovascular;  Laterality: N/A;  . CARDIOVERSION N/A 08/22/2018   Procedure: CARDIOVERSION;  Surgeon: Skeet Latch, MD;  Location: [facility] ENDOSCOPY;  Service: Cardiovascular;  Laterality: N/A;  . CARDIOVERSION N/A 06/28/2019   Procedure: CARDIOVERSION;  Surgeon: Thayer Headings, MD;  Location: [place];  Service: Cardiovascular;  Laterality: N/A;  .  CLIPPING OF ATRIAL APPENDAGE N/A 10/16/2019   Procedure: Clipping Of Atrial Appendage using AtriCure AtriClip Exclusion VLAA 78mm.;  Surgeon: [name], MD;  Location: [place];  Service: Open Heart Surgery;  Laterality: N/A;  . COLONOSCOPY  2016  . CYST EXCISION     polynomial  . CYSTOSCOPY WITH INSERTION OF UROLIFT N/A 04/18/2018   Procedure: CYSTOSCOPY WITH INSERTION OF UROLIFT;  Surgeon: Cleon Gustin, MD;  Location: [facility];  Service: Urology;  Laterality: N/A;  . KNEE SURGERY Right 2000   arthroscopy  . MAZE N/A 10/16/2019   Procedure: MAZE with bilateral pulmonary vein isolation.;  Surgeon: [name], MD;  Location: [place];  Service: Open Heart Surgery;  Laterality: N/A;  Maze procedure using Atricure OLL2 Isolator clamp.  . REPLACEMENT ASCENDING AORTA N/A 10/16/2019   Procedure: SUPRA CORONARY REPLACEMENT OF ASCENDING AORTA using Maquet Hemashiel Platinum 34 MM Vascular Graft.;  Surgeon: [name], MD;  Location: [place];  Service: Open Heart Surgery;  Laterality: N/A;  Right subclavian artery cannulation.  Marland Kitchen RIGHT/LEFT HEART CATH AND CORONARY ANGIOGRAPHY N/A 01/30/2019   Procedure: RIGHT/LEFT HEART CATH AND CORONARY ANGIOGRAPHY;  Surgeon: [name], [name], MD;  Location: [place] [facility];  Service: Cardiovascular;  Laterality: N/A;  . TEE WITHOUT CARDIOVERSION N/A 10/16/2019   Procedure: TRANSESOPHAGEAL ECHOCARDIOGRAM (TEE);  Surgeon: [name], MD;  Location: [place];  Service: Open Heart Surgery;  Laterality: N/A;  . TONSILLECTOMY    . widson teeth extraction     Family History  Problem Relation Age of Onset  . Aneurysm Father   . Heart disease Father   . Varicose Veins Mother   . Arthritis Mother   . Macular degeneration Mother   . Cancer Neg Hx   . Colon cancer Neg Hx   . Esophageal cancer Neg Hx   . Rectal cancer Neg Hx   . Stomach cancer Neg Hx    Social History   Socioeconomic History  . Marital status: Single    Spouse  name: Not on file  . Number of children: Not on file  . Years of education: Not on file  . Highest education level: Not on file  Occupational History  . Not on file  Tobacco Use  . Smoking status: Former Smoker    Packs/day: 1.50    Years: 5.00    Pack years: 7.50    Types: Cigarettes    Quit date: 06/15/1994    Years since quitting: 26.2  . Smokeless tobacco: Never Used  Vaping Use  . Vaping Use: Never used  Substance and Sexual Activity  . Alcohol use: Not Currently    Alcohol/week: 14.0 standard drinks    Types: 14 Shots of liquor per week    Comment: 1 drink of liquor per day  . Drug use: Yes    Types: Marijuana    Comment: occasionally marijuana  . Sexual activity: Yes    Partners: Female    Birth control/protection: Condom    Comment: condom use most of the time but not always  Other Topics Concern  .  Not on file  [place] - [employer]. married '72- 3 years/divorced; married '89- 3 years/divorced;. married '03 - 3 years/divorced. 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   Social Determinants of Health   Financial Resource Strain: Low Risk   . Difficulty of Paying Living Expenses: Not hard at all  Food Insecurity: No Food Insecurity  . Worried About Charity fundraiser in the Last Year: Never true  . Ran Out of Food in the Last Year: Never true  Transportation Needs: No Transportation Needs  . Lack of Transportation (Medical): No  . Lack of Transportation (Non-Medical): No  Physical Activity: Sufficiently Active  . Days of Exercise per Week: 7 days  . Minutes of Exercise per Session: 30 min  Stress: Stress Concern Present  . Feeling of Stress : To some extent  Social Connections: Not on file    Tobacco Counseling Counseling given: Not Answered   Clinical Intake:  Pre-visit preparation completed: Yes  Pain : No/denies pain Pain Score: 0-No pain     BMI - recorded: 31.07 Nutritional Status: BMI > 30  Obese Nutritional  Risks: None Diabetes: Yes CBG done?: No Did pt. bring in CBG monitor from home?: No  How often do you need to have someone help you when you read instructions, pamphlets, or other written materials from your doctor or pharmacy?: 1 - Never What is the last grade level you completed in school?: College  Diabetic? yes  Interpreter Needed?: No  Information entered by :: Lisette Abu, LPN   Activities of Daily Living In your present state of health, do you have any difficulty performing the following activities: 09/02/2020 03/21/2020  Hearing? N N  Vision? N N  Difficulty concentrating or making decisions? N N  Walking or climbing stairs? N N  Dressing or bathing? N N  Doing errands, shopping? N N  Preparing Food and eating ? N -  Using the Toilet? N -  In the past six months, have you accidently leaked urine? N -  Do you have problems with loss of bowel control? N -  Managing your Medications? N -  Managing your Finances? N -  Housekeeping or managing your Housekeeping? N -  Some recent data might be hidden    Patient Care Team: [name], MD as PCP - General (Internal Medicine) [name], [name], MD as PCP - Cardiology (Cardiology) [name], MD as PCP - Electrophysiology (Cardiology) Charlton Haws, [facility] as Pharmacist (Pharmacist)  Indicate any recent Medical Services you may have received from other than [facility] providers in the past year (date may be approximate).     Assessment:   This is a routine wellness examination for [place].  Hearing/Vision screen No exam data present  Dietary issues and exercise activities discussed: Current Exercise Habits: Home exercise routine, Type of exercise: walking (6000 steps daily), Time (Minutes): 30, Frequency (Times/Week): 7, Weekly Exercise (Minutes/Week): 210, Intensity: Mild, Exercise limited by: cardiac condition(s);respiratory conditions(s);psychological condition(s)  Goals    . Patient Stated      Weight goal of 240 pounds    . Pharmacy Care Plan     CARE PLAN ENTRY (see longitudinal plan of care for additional care plan information)  Current Barriers:  . Chronic Disease Management support, education, and care coordination needs related to Hypertension, Hyperlipidemia, Diabetes, Atrial Fibrillation, and Heart Failure   Hypertension / Heart Failure BP Readings from Last 3 Encounters:  01/01/20 116/66  12/28/19  114/76  11/16/19 104/69 .  Pharmacist Clinical Goal(s): o Over the next 180 days, patient will work with PharmD and providers to maintain BP goal <130/80 . Current regimen:  o Metoprolol tartrate 25 mg - 1/2 tablet twice a day o Spironolactone 25 mg - 1/2 tablet at bedtime . Interventions: o Discussed BP goals and benefits of medication for prevention of heart attack / stroke . Patient self care activities - Over the next 180 days, patient will: o Check BP as needed, document, and provide at future appointments o Ensure daily salt intake < 2300 mg/day  Hyperlipidemia Lab Results  Component Value Date/Time   LDLCALC 48 06/01/2018 01:28 PM   [place] 46 03/11/2017 03:41 PM   LDLDIRECT 78.0 05/29/2019 02:30 PM .  Pharmacist Clinical Goal(s): o Over the next 180 days, patient will work with PharmD and providers to maintain LDL goal < 70 . Current regimen:  o Atorvastatin 20 mg daily . Interventions: o Discussed cholesterol  goals and benefits of medication for prevention of heart attack / stroke . Patient self care activities - Over the next 180 days, patient will: o Continue medication as prescribed  Diabetes Lab Results  Component Value Date/Time   HGBA1C 6.3 (H) 10/12/2019 11:45 AM   HGBA1C 6.6 (H) 09/06/2019 11:21 AM .  Pharmacist Clinical Goal(s): o Over the next 180 days, patient will work with PharmD and providers to maintain A1c goal <7% . Current regimen:  o Synjardy XR 25-1000 mg daily . Interventions: o Discussed A1c goal and benefits of medication,  diet and exercise for prevention of diabetic complications o Pursue patient assistance for Synjardy via [employer] . Patient self care activities - Over the next 180 days, patient will: o Continue medication as prescribed o Continue diet and exercise routine o Sign patient assistance application at the office  Atrial Fibrillation . Pharmacist Clinical Goal(s) o Over the next 180 days, patient will work with PharmD and providers to optimize therapy . Current regimen:  o Metoprolol tartrate 25 mg - 1/2 tablet BID o Xarelto 20 mg daily . Interventions: o Discussed high cost Xarelto and options to save money . Patient self care activities - Over the next 180 days, patient will: o Call Engineer, maintenance (1-888-[phone]) to get Xarelto for $85 per month during the donut hole  Medication management . Pharmacist Clinical Goal(s): o Over the next 180 days, patient will work with PharmD and providers to maintain optimal medication adherence . Current pharmacy: [facility] . Interventions o Comprehensive medication review performed. o Continue current medication management strategy . Patient self care activities - Over the next 180 days, patient will: o Focus on medication adherence by pill box o Take medications as prescribed o Report any questions or concerns to PharmD and/or provider(s)  Initial goal documentation      Depression Screen PHQ 2/9 Scores 09/02/2020 05/29/2019 06/01/2018 05/12/2016  PHQ - 2 Score 1 0 0 0    Fall Risk Fall Risk  09/02/2020 06/01/2018 05/12/2016  Falls in the past year? 1 0 No  Number falls in past yr: 0 0 -  Injury with Fall? 1 0 -  Follow up Falls evaluation completed Falls evaluation completed -    FALL RISK PREVENTION PERTAINING TO THE HOME:  Any stairs in or around the home? Yes  If so, are there any without handrails? No  Home free of loose throw rugs in walkways, pet beds, electrical cords, etc? Yes  Adequate lighting in your home to reduce  risk  of falls? Yes   ASSISTIVE DEVICES UTILIZED TO PREVENT FALLS:  Life alert? No  Use of a cane, walker or w/c? No  Grab bars in the bathroom? Yes  Shower chair or bench in shower? Yes  Elevated toilet seat or a handicapped toilet? Yes   TIMED UP AND GO:  Was the test performed? No .  Length of time to ambulate 10 feet: 0 sec.   Gait slow and steady without use of assistive device  Cognitive Function: Normal cognitive status assessed by direct observation by this Nurse Health Advisor. No abnormalities found.          Immunizations Immunization History  Administered Date(s) Administered  . Fluad Quad(high Dose 65+) 03/22/2020  . Influenza, High Dose Seasonal PF 03/10/2013, 02/26/2017, 02/22/2018  . Influenza-Unspecified 04/08/2015, 02/25/2016, 02/09/2019  . PFIZER(Purple Top)SARS-COV-2 Vaccination 08/18/2019, 09/13/2019, 03/29/2020  . Pneumococcal Conjugate-13 01/26/2014  . Pneumococcal Polysaccharide-23 04/25/2012, 07/15/2017  . Tdap 07/22/2011  . Zoster 07/22/2011    TDAP status: Up to date  Flu Vaccine status: Up to date  Pneumococcal vaccine status: Up to date  Covid-19 vaccine status: Completed vaccines  Qualifies for Shingles Vaccine? Yes   Zostavax completed Yes   Shingrix Completed?: No.    Education has been provided regarding the importance of this vaccine. Patient has been advised to call insurance company to determine out of pocket expense if they have not yet received this vaccine. Advised may also receive vaccine at local pharmacy or Health Dept. Verbalized acceptance and understanding.  Screening Tests Health Maintenance  Topic Date Due  . OPHTHALMOLOGY EXAM  03/22/2020  . HEMOGLOBIN A1C  09/19/2020  . FOOT EXAM  11/13/2020  . TETANUS/TDAP  07/21/2021  . COLONOSCOPY (Pts 45-97yrs Insurance coverage will need to be confirmed)  05/01/2025  . INFLUENZA VACCINE  Completed  . COVID-19 Vaccine  Completed  . Hepatitis C Screening  Completed  . PNA vac Low  Risk Adult  Completed  . HPV VACCINES  Aged Out    Health Maintenance  Health Maintenance Due  Topic Date Due  . OPHTHALMOLOGY EXAM  03/22/2020    Colorectal cancer screening: Type of screening: Colonoscopy. Completed 05/02/2015. Repeat every 10 years  Lung Cancer Screening: (Low Dose CT Chest recommended if Age 74-80 years, 30 pack-year currently smoking OR have quit w/in 15years.) does qualify.   Lung Cancer Screening Referral: no  Additional Screening:  Hepatitis C Screening: does qualify; Completed yes  Vision Screening: Recommended annual ophthalmology exams for early detection of glaucoma and other disorders of the eye. Is the patient up to date with their annual eye exam?  Yes  Who is the provider or what is the name of the office in which the patient attends annual eye exams? [facility] If pt is not established with a provider, would they like to be referred to a provider to establish care? No .   Dental Screening: Recommended annual dental exams for proper oral hygiene  Community Resource Referral / Chronic Care Management: CRR required this visit?  No   CCM required this visit?  No      Plan:     I have personally reviewed and noted the following in the patient's chart:   . Medical and social history . Use of alcohol, tobacco or illicit drugs  . Current medications and supplements . Functional ability and status . Nutritional status . Physical activity . Advanced directives . List of other physicians . Hospitalizations, surgeries, and ER visits  in previous 12 months . Vitals . Screenings to include cognitive, depression, and falls . Referrals and appointments  In addition, I have reviewed and discussed with patient certain preventive protocols, quality metrics, and best practice recommendations. A written personalized care plan for preventive services as well as general preventive health recommendations were provided to patient.     Sheral Flow, LPN   [id number]/[id number]   Nurse Notes:  Medications reviewed with patient; no opioid use noted.

## 2020-09-02 NOTE — Progress Notes (Signed)
Subjective:  Patient ID: Clinton Black, male    DOB: April 02, 1946  Age: 75 y.o. MRN: [id number]  CC: Annual Exam, Hypertension, Hyperlipidemia, Diabetes, and Congestive Heart Failure  This visit occurred during the SARS-CoV-2 public health emergency.  Safety protocols were in place, including screening questions prior to the visit, additional usage of staff PPE, and extensive cleaning of exam room while observing appropriate contact time as indicated for disinfecting solutions.    HPI DEMARQUEZ CIOLEK presents for a CPX.  According to prescription refills he is no longer taking the SGLT2 inhibitor and Metformin.  He continues to complain of insomnia.  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  He says the pain is not associated with shortness of breath, diaphoresis, palpitations, edema, dizziness, or lightheadedness.  He tells me the pain is exacerbated by movement and palpation.  He denies pleuritic discomfort.  Outpatient Medications Prior to Visit  Medication Sig Dispense Refill  . atorvastatin (LIPITOR) 20 MG tablet TAKE 1 TABLET AT BEDTIME 90 tablet 1  . losartan (COZAAR) 25 MG tablet TAKE 2 TABLETS EVERY DAY 180 tablet 3  . metoprolol tartrate (LOPRESSOR) 25 MG tablet Take 0.5 tablets (12.5 mg total) by mouth 2 (two) times daily. 90 tablet 1  . alfuzosin (UROXATRAL) 10 MG 24 hr tablet Take 1 tablet (10 mg total) by mouth daily with breakfast. 90 tablet 1  . Empagliflozin-metFORMIN HCl ER (SYNJARDY XR) 25-1000 MG TB24 Take 1 tablet by mouth daily. 90 tablet 1  . Menthol-Methyl Salicylate (SALONPAS PAIN RELIEF PATCH EX) Apply 1 patch topically daily as needed (back pain).    . rivaroxaban (XARELTO) 20 MG TABS tablet Take 1 tablet (20 mg total) by mouth daily with supper. 30 tablet  5  . triazolam (HALCION) 0.25 MG tablet TAKE 1 TABLET BY MOUTH AT BEDTIME AS NEEDED FOR SLEEP. 30 tablet 3   No facility-administered medications prior to visit.    ROS Review of Systems  Constitutional: Negative.  Negative for appetite change, chills, diaphoresis, fatigue and fever.  HENT: Negative.  Negative for trouble swallowing.   Eyes: Negative.   Respiratory: Positive for apnea. Negative for cough, chest tightness, shortness of breath and wheezing.   Cardiovascular: Positive for chest pain. Negative for palpitations and leg swelling.  Gastrointestinal: Negative for abdominal pain, blood in stool, constipation, diarrhea and nausea.  Endocrine: Negative.  Negative for polydipsia, polyphagia and polyuria.  Genitourinary: Positive for difficulty urinating. Negative for hematuria, testicular pain and urgency.       Nocturia  Musculoskeletal: Positive for arthralgias. Negative for myalgias.  Neurological: Negative.  Negative for dizziness, weakness and light-headedness.  Hematological: Negative for adenopathy. Does not bruise/bleed easily.  Psychiatric/Behavioral: Positive for sleep disturbance. Negative for behavioral problems, confusion, dysphoric mood and suicidal ideas. The patient is not nervous/anxious and is not hyperactive.     Objective:  BP 140/86 (BP Location: Right Arm, Patient Position: Sitting, Cuff Size: Large) Comment: BP (L) 142/84  Pulse 74   Temp 98 F (36.7 C) (Oral)   Resp 16   Ht 6\' 5"  (1.956 m)   Wt 262 lb (118.8 kg)   SpO2 95%   BMI 31.07 kg/m   BP Readings from Last 3 Encounters:  09/02/20 140/86  09/02/20 118/70  08/21/20 120/67    Wt Readings from Last 3 Encounters:  09/02/20 262 lb (118.8 kg)  09/02/20 262 lb (118.8 kg)  08/21/20 275 lb (124.7 kg)    Physical Exam Vitals reviewed.  Constitutional:      Appearance: Normal appearance.  HENT:     Nose: Nose normal.     Mouth/Throat:     Mouth: Mucous membranes are moist.  Eyes:      General: No scleral icterus.    Conjunctiva/sclera: Conjunctivae normal.  Cardiovascular:     Rate and Rhythm: Normal rate and regular rhythm.     Heart sounds: Normal heart sounds, S1 normal and S2 normal. No friction rub. No gallop.      Comments: EKG- NSR, 70 bpm Normal EKG Pulmonary:     Effort: Pulmonary effort is normal.     Breath sounds: No stridor. No wheezing, rhonchi or rales.  Abdominal:     General: Abdomen is flat.     Palpations: There is no mass.     Tenderness: There is no abdominal tenderness. There is no guarding.     Hernia: No hernia is present. There is no hernia in the left inguinal area or right inguinal area.  Genitourinary:    Pubic Area: No rash.      Penis: Normal.      Testes: Normal.     Epididymis:     Right: Normal. Not inflamed or enlarged. No mass.     Left: Normal. Not inflamed or enlarged. No mass.     Prostate: Enlarged. Not tender and no nodules present.     Rectum: Normal. Guaiac result negative. No mass, tenderness, anal fissure, external hemorrhoid or internal hemorrhoid. Normal anal tone.  Musculoskeletal:        General: No swelling or tenderness. Normal range of motion.     Cervical back: Neck supple.     Right lower leg: No edema.     Left lower leg: No edema.  Lymphadenopathy:     Lower Body: No right inguinal adenopathy. No left inguinal adenopathy.  Skin:    General: Skin is warm and dry.     Coloration: Skin is not pale.     Findings: No lesion or rash.  Neurological:     General: No focal deficit present.     Mental Status: He is alert and oriented to person, place, and time. Mental status is at baseline.  Psychiatric:        Mood and Affect: Mood normal.        Behavior: Behavior normal.     Lab Results  Component Value Date   WBC 7.5 09/02/2020   HGB 14.3 09/02/2020   HCT 42.6 09/02/2020   PLT 197.0 09/02/2020   GLUCOSE 104 (H) 09/02/2020   CHOL 133 09/02/2020   TRIG 150.0 (H) 09/02/2020   HDL 34.10 (L)  09/02/2020   LDLDIRECT 78.0 05/29/2019   LDLCALC 69 09/02/2020   ALT 10 09/02/2020   AST 13 09/02/2020   NA 142 09/02/2020   K 4.8 09/02/2020   CL 105 09/02/2020   CREATININE 0.97 09/02/2020   BUN 17 09/02/2020   CO2 26 09/02/2020   TSH 1.73 09/02/2020   PSA 4.47 (H) 09/02/2020   INR 1.5 (H) 03/20/2020   HGBA1C 6.2 09/02/2020   MICROALBUR 1.7 09/02/2020    CT ANGIO CHEST AORTA W/CM & OR WO/CM  Result Date: 07/15/2020 CLINICAL DATA:  History of aortic valve replacement, replacement of ascending thoracic aorta and  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 EXAM: CT ANGIOGRAPHY CHEST WITH CONTRAST TECHNIQUE: Multidetector CT imaging of the chest was performed using the standard protocol during bolus administration of intravenous contrast. Multiplanar CT image reconstructions and MIPs were obtained to evaluate the vascular anatomy. CONTRAST:  88mL ISOVUE-370 IOPAMIDOL (ISOVUE-370) INJECTION 76% COMPARISON:  03/20/2020 and 09/14/2019. FINDINGS: Cardiovascular: Stable postoperative appearance of prosthetic aortic valve and ascending aortic graft without evidence of for anastomotic pseudoaneurysm or other complication. They of aorta distal to the graft shows stable aneurysmal dilatation with the distal ascending thoracic aorta measuring up to 4.4 cm, the distal arch measuring 4 cm in the descending thoracic aorta measuring 3.9-4.3 cm. No evidence of aortic dissection stable patency of proximal great vessels without significant stenosis. 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 The heart size is stable and within normal limits. No pericardial fluid identified. Normal caliber of central pulmonary arteries. Stable calcified plaque in the distribution of the proximal LAD. Mediastinum/Nodes: No enlarged mediastinal, hilar, or axillary lymph nodes. Thyroid gland, trachea, and esophagus demonstrate no  significant findings. Lungs/Pleura: There is no evidence of pulmonary edema, consolidation, pneumothorax, nodule or pleural fluid. Upper Abdomen: No acute abnormality. Musculoskeletal: No chest wall abnormality. No acute or significant osseous findings. Review of the MIP images confirms the above findings. IMPRESSION: 1. Stable postoperative appearance of prosthetic aortic valve and ascending aortic graft without evidence of anastomotic pseudoaneurysm or other complication. 2. 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 3. 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 4. Coronary artery disease with calcified plaque in the distribution of the proximal LAD. Electronically Signed   By: Aletta Edouard M.D.   On: 07/15/2020 13:09      Assessment & Plan:   Asencion was seen today for annual exam, hypertension, hyperlipidemia, diabetes and congestive heart failure.  Diagnoses and all orders for this visit:  Chronic diastolic CHF (congestive heart failure), NYHA class 2 ([place])- There is no evidence of fluid overload. -     EKG 12-Lead  Essential hypertension- His blood pressure is well controlled. -     EKG 12-Lead -     Basic metabolic panel; Future -     CBC with Differential/Platelet; Future -     Urinalysis, Routine w reflex microscopic; Future -     Basic metabolic panel -     CBC with Differential/Platelet -     Urinalysis, Routine w reflex microscopic  Type 2 diabetes mellitus with complication, without long-term current use of insulin ([place])- His blood sugar is well controlled with lifestyle modifications. -     Basic metabolic panel; Future -     Hemoglobin A1c; Future -     Microalbumin / creatinine urine ratio; Future -     HM Diabetes Foot Exam -     Basic metabolic panel -     Hemoglobin A1c -     Microalbumin / creatinine  urine ratio  BPH with elevated PSA- His PSA is not rising.  This is a reassuring sign that he does not have prostate cancer. -     PSA; Future -     Urinalysis, Routine w reflex microscopic; Future -     PSA -     Urinalysis, Routine w reflex microscopic -  Cancel: Ambulatory referral to Urology -     alfuzosin (UROXATRAL) 10 MG 24 hr tablet; Take 1 tablet (10 mg total) by mouth daily with breakfast.  Hyperlipidemia LDL goal <130- LDL goal achieved. Doing well on the statin -     Lipid panel; Future -     Hepatic function panel; Future -     Lipid panel -     Hepatic function panel  Routine general medical examination at a health care facility- Exam completed, labs reviewed, vaccines reviewed and updated, cancer screenings are up-to-date, patient education was given.  TSH elevation- His TSH is normal now and clinically he is euthyroid. -     TSH; Future -     TSH  Left-sided chest pain- I have asked him to undergo a chest x-ray to see if there are any structural lesions in the rib cage or chest.  His symptoms are not ischemic in nature. -     DG Chest 2 View; Future  Atrial fibrillation, new onset ([place])  PAF (paroxysmal atrial fibrillation) ([place])- He has good rate and rhythm control.  Will continue anticoagulation with the DOAC. -     rivaroxaban (XARELTO) 20 MG TABS tablet; Take 1 tablet (20 mg total) by mouth daily with supper.  Insomnia w/ sleep apnea -     triazolam (HALCION) 0.25 MG tablet; Take 1 tablet (0.25 mg total) by mouth at bedtime as needed. for sleep  PSA elevation  Benign prostatic hyperplasia without lower urinary tract symptoms -     alfuzosin (UROXATRAL) 10 MG 24 hr tablet; Take 1 tablet (10 mg total) by mouth daily with breakfast.   I have discontinued Oval [employer] Menthol-Methyl Salicylate (SALONPAS PAIN RELIEF PATCH EX) and Synjardy XR. I have also changed his triazolam. Additionally, I am having him maintain his metoprolol tartrate, losartan,  atorvastatin, rivaroxaban, and alfuzosin.  Meds ordered this encounter  Medications  . rivaroxaban (XARELTO) 20 MG TABS tablet    Sig: Take 1 tablet (20 mg total) by mouth daily with supper.    Dispense:  90 tablet    Refill:  1  . triazolam (HALCION) 0.25 MG tablet    Sig: Take 1 tablet (0.25 mg total) by mouth at bedtime as needed. for sleep    Dispense:  90 tablet    Refill:  0  . alfuzosin (UROXATRAL) 10 MG 24 hr tablet    Sig: Take 1 tablet (10 mg total) by mouth daily with breakfast.    Dispense:  90 tablet    Refill:  1   In addition to time spent on CPE, I spent 45 minutes in preparing to see the patient by review of recent labs and images, obtaining and reviewing separately obtained history, communicating with the patient , ordering medications, tests or procedures, and documenting clinical information in the EHR including the differential Dx, treatment, and any further evaluation and other management of 1. Chronic diastolic CHF (congestive heart failure), NYHA class 2 ([place]) 2. Essential hypertension 3. Type 2 diabetes mellitus with complication, without long-term current use of insulin ([place]) 4. BPH with elevated PSA 5. Hyperlipidemia LDL goal <130 6. TSH elevation 7. Left-sided chest pain 8. PAF (paroxysmal atrial fibrillation) ([place]) 9. Insomnia w/ sleep apnea 10. Benign prostatic hyperplasia without lower urinary tract symptoms     Follow-up: Return in about 4 weeks (around 09/30/2020).  Scarlette Calico, MD

## 2020-09-02 NOTE — Patient Instructions (Signed)
[name] , Thank you for taking time to come for your Medicare Wellness Visit. I appreciate your ongoing commitment to your health goals. Please review the following plan we discussed and let me know if I can assist you in the future.   Screening recommendations/referrals: Colonoscopy: 05/02/2015; due every 10 years Recommended yearly ophthalmology/optometry visit for glaucoma screening and checkup Recommended yearly dental visit for hygiene and checkup  Vaccinations: Influenza vaccine: 03/22/2020 Pneumococcal vaccine: 01/26/2014, 07/15/2017 Tdap vaccine: 07/22/2011; due very 10 years Shingles vaccine: no record   Covid-19: 08/18/2019, 09/13/2019, 03/29/2020  Advanced directives: Please bring a copy of your health care power of attorney and living will to the office at your convenience.   Conditions/risks identified: Yes; Reviewed health maintenance screenings with patient today and relevant education, vaccines, and/or referrals were provided. Please continue to do your personal lifestyle choices by: daily care of teeth and gums, regular physical activity (goal should be 5 days a week for 30 minutes), eat a healthy diet, avoid tobacco and drug use, limiting any alcohol intake, taking a low-dose aspirin (if not allergic or have been advised by your provider otherwise) and taking vitamins and minerals as recommended by your provider. Continue doing brain stimulating activities (puzzles, reading, adult coloring books, staying active) to keep memory sharp. Continue to eat heart healthy diet (full of fruits, vegetables, whole grains, lean protein, water--limit salt, fat, and sugar intake) and increase physical activity as tolerated.  Next appointment: Please schedule your next Medicare Wellness Visit with your Nurse Health Advisor in 1 year by calling [phone].  Preventive Care 73 Years and Older, Male Preventive care refers to lifestyle choices and visits with your health care provider that can  promote health and wellness. What does preventive care include?  A yearly physical exam. This is also called an annual well check.  Dental exams once or twice a year.  Routine eye exams. Ask your health care provider how often you should have your eyes checked.  Personal lifestyle choices, including:  Daily care of your teeth and gums.  Regular physical activity.  Eating a healthy diet.  Avoiding tobacco and drug use.  Limiting alcohol use.  Practicing safe sex.  Taking low doses of aspirin every day.  Taking vitamin and mineral supplements as recommended by your health care provider. What happens during an annual well check? The services and screenings done by your health care provider during your annual well check will depend on your age, overall health, lifestyle risk factors, and family history of disease. Counseling  Your health care provider may ask you questions about your:  Alcohol use.  Tobacco use.  Drug use.  Emotional well-being.  Home and relationship well-being.  Sexual activity.  Eating habits.  History of falls.  Memory and ability to understand (cognition).  Work and work Statistician. Screening  You may have the following tests or measurements:  Height, weight, and BMI.  Blood pressure.  Lipid and cholesterol levels. These may be checked every 5 years, or more frequently if you are over 45 years old.  Skin check.  Lung cancer screening. You may have this screening every year starting at age 49 if you have a 30-pack-year history of smoking and currently smoke or have quit within the past 15 years.  Fecal occult blood test (FOBT) of the stool. You may have this test every year starting at age 76.  Flexible sigmoidoscopy or colonoscopy. You may have a sigmoidoscopy every 5 years or a colonoscopy every 10 years  starting at age 86.  Prostate cancer screening. Recommendations will vary depending on your family history and other  risks.  Hepatitis C blood test.  Hepatitis B blood test.  Sexually transmitted disease (STD) testing.  Diabetes screening. This is done by checking your blood sugar (glucose) after you have not eaten for a while (fasting). You may have this done every 1-3 years.  Abdominal aortic aneurysm (AAA) screening. You may need this if you are a current or former smoker.  Osteoporosis. You may be screened starting at age 51 if you are at high risk. Talk with your health care provider about your test results, treatment options, and if necessary, the need for more tests. Vaccines  Your health care provider may recommend certain vaccines, such as:  Influenza vaccine. This is recommended every year.  Tetanus, diphtheria, and acellular pertussis (Tdap, Td) vaccine. You may need a Td booster every 10 years.  Zoster vaccine. You may need this after age 95.  Pneumococcal 13-valent conjugate (PCV13) vaccine. One dose is recommended after age 55.  Pneumococcal polysaccharide (PPSV23) vaccine. One dose is recommended after age 64. Talk to your health care provider about which screenings and vaccines you need and how often you need them. This information is not intended to replace advice given to you by your health care provider. Make sure you discuss any questions you have with your health care provider. Document Released: 06/28/2015 Document Revised: 02/19/2016 Document Reviewed: 04/02/2015 Elsevier Interactive Patient Education  2017 [place] Prevention in the Home Falls can cause injuries. They can happen to people of all ages. There are many things you can do to make your home safe and to help prevent falls. What can I do on the outside of my home?  Regularly fix the edges of walkways and driveways and fix any cracks.  Remove anything that might make you trip as you walk through a door, such as a raised step or threshold.  Trim any bushes or trees on the path to your home.  Use  bright outdoor lighting.  Clear any walking paths of anything that might make someone trip, such as rocks or tools.  Regularly check to see if handrails are loose or broken. Make sure that both sides of any steps have handrails.  Any raised decks and porches should have guardrails on the edges.  Have any leaves, snow, or ice cleared regularly.  Use sand or salt on walking paths during winter.  Clean up any spills in your garage right away. This includes oil or grease spills. What can I do in the bathroom?  Use night lights.  Install grab bars by the toilet and in the tub and shower. Do not use towel bars as grab bars.  Use non-skid mats or decals in the tub or shower.  If you need to sit down in the shower, use a plastic, non-slip stool.  Keep the floor dry. Clean up any water that spills on the floor as soon as it happens.  Remove soap buildup in the tub or shower regularly.  Attach bath mats securely with double-sided non-slip rug tape.  Do not have throw rugs and other things on the floor that can make you trip. What can I do in the bedroom?  Use night lights.  Make sure that you have a light by your bed that is easy to reach.  Do not use any sheets or blankets that are too big for your bed. They should not hang down  onto the floor.  Have a firm chair that has side arms. You can use this for support while you get dressed.  Do not have throw rugs and other things on the floor that can make you trip. What can I do in the kitchen?  Clean up any spills right away.  Avoid walking on wet floors.  Keep items that you use a lot in easy-to-reach places.  If you need to reach something above you, use a strong step stool that has a grab bar.  Keep electrical cords out of the way.  Do not use floor polish or wax that makes floors slippery. If you must use wax, use non-skid floor wax.  Do not have throw rugs and other things on the floor that can make you trip. What can  I do with my stairs?  Do not leave any items on the stairs.  Make sure that there are handrails on both sides of the stairs and use them. Fix handrails that are broken or loose. Make sure that handrails are as long as the stairways.  Check any carpeting to make sure that it is firmly attached to the stairs. Fix any carpet that is loose or worn.  Avoid having throw rugs at the top or bottom of the stairs. If you do have throw rugs, attach them to the floor with carpet tape.  Make sure that you have a light switch at the top of the stairs and the bottom of the stairs. If you do not have them, ask someone to add them for you. What else can I do to help prevent falls?  Wear shoes that:  Do not have high heels.  Have rubber bottoms.  Are comfortable and fit you well.  Are closed at the toe. Do not wear sandals.  If you use a stepladder:  Make sure that it is fully opened. Do not climb a closed stepladder.  Make sure that both sides of the stepladder are locked into place.  Ask someone to hold it for you, if possible.  Clearly mark and make sure that you can see:  Any grab bars or handrails.  First and last steps.  Where the edge of each step is.  Use tools that help you move around (mobility aids) if they are needed. These include:  Canes.  Walkers.  Scooters.  Crutches.  Turn on the lights when you go into a dark area. Replace any light bulbs as soon as they burn out.  Set up your furniture so you have a clear path. Avoid moving your furniture around.  If any of your floors are uneven, fix them.  If there are any pets around you, be aware of where they are.  Review your medicines with your doctor. Some medicines can make you feel dizzy. This can increase your chance of falling. Ask your doctor what other things that you can do to help prevent falls. This information is not intended to replace advice given to you by your health care provider. Make sure you  discuss any questions you have with your health care provider. Document Released: 03/28/2009 Document Revised: 11/07/2015 Document Reviewed: 07/06/2014 Elsevier Interactive Patient Education  2017 [employer].

## 2020-09-02 NOTE — Patient Instructions (Signed)

## 2020-09-03 ENCOUNTER — Ambulatory Visit: Payer: Medicare HMO | Admitting: Internal Medicine

## 2020-09-08 DIAGNOSIS — R972 Elevated prostate specific antigen [PSA]: Secondary | ICD-10-CM | POA: Insufficient documentation

## 2020-09-08 MED ORDER — ALFUZOSIN HCL ER 10 MG PO TB24: 10.0000 mg | ORAL_TABLET | Freq: Every day | ORAL | 1 refills | Status: DC

## 2020-09-15 ENCOUNTER — Other Ambulatory Visit: Payer: Self-pay

## 2020-09-15 ENCOUNTER — Encounter (HOSPITAL_COMMUNITY): Payer: Self-pay

## 2020-09-15 ENCOUNTER — Emergency Department (HOSPITAL_COMMUNITY): Payer: Medicare HMO

## 2020-09-15 ENCOUNTER — Observation Stay (HOSPITAL_COMMUNITY)
Admission: EM | Admit: 2020-09-15 | Discharge: 2020-09-16 | Disposition: A | Discharge status: Home / Self Care | Payer: Medicare HMO | Attending: Family Medicine | Admitting: Family Medicine

## 2020-09-15 DIAGNOSIS — G9340 Encephalopathy, unspecified: Principal | ICD-10-CM

## 2020-09-15 DIAGNOSIS — Z20822 Contact with and (suspected) exposure to covid-19: Secondary | ICD-10-CM | POA: Insufficient documentation

## 2020-09-15 DIAGNOSIS — I491 Atrial premature depolarization: Secondary | ICD-10-CM | POA: Diagnosis not present

## 2020-09-15 DIAGNOSIS — I110 Hypertensive heart disease with heart failure: Secondary | ICD-10-CM | POA: Insufficient documentation

## 2020-09-15 DIAGNOSIS — G9341 Metabolic encephalopathy: Secondary | ICD-10-CM | POA: Diagnosis not present

## 2020-09-15 DIAGNOSIS — I5032 Chronic diastolic (congestive) heart failure: Secondary | ICD-10-CM | POA: Diagnosis not present

## 2020-09-15 DIAGNOSIS — I712 Thoracic aortic aneurysm, without rupture, unspecified: Secondary | ICD-10-CM | POA: Diagnosis present

## 2020-09-15 DIAGNOSIS — R410 Disorientation, unspecified: Secondary | ICD-10-CM | POA: Diagnosis not present

## 2020-09-15 DIAGNOSIS — I639 Cerebral infarction, unspecified: Secondary | ICD-10-CM | POA: Diagnosis not present

## 2020-09-15 DIAGNOSIS — R29818 Other symptoms and signs involving the nervous system: Secondary | ICD-10-CM | POA: Diagnosis not present

## 2020-09-15 DIAGNOSIS — Z7901 Long term (current) use of anticoagulants: Secondary | ICD-10-CM | POA: Diagnosis not present

## 2020-09-15 DIAGNOSIS — G319 Degenerative disease of nervous system, unspecified: Secondary | ICD-10-CM | POA: Diagnosis not present

## 2020-09-15 DIAGNOSIS — I10 Essential (primary) hypertension: Secondary | ICD-10-CM | POA: Diagnosis not present

## 2020-09-15 DIAGNOSIS — I499 Cardiac arrhythmia, unspecified: Secondary | ICD-10-CM | POA: Diagnosis not present

## 2020-09-15 DIAGNOSIS — R404 Transient alteration of awareness: Secondary | ICD-10-CM | POA: Diagnosis not present

## 2020-09-15 DIAGNOSIS — Z87891 Personal history of nicotine dependence: Secondary | ICD-10-CM | POA: Insufficient documentation

## 2020-09-15 DIAGNOSIS — E119 Type 2 diabetes mellitus without complications: Secondary | ICD-10-CM | POA: Diagnosis not present

## 2020-09-15 DIAGNOSIS — R29810 Facial weakness: Secondary | ICD-10-CM | POA: Diagnosis not present

## 2020-09-15 DIAGNOSIS — Z7984 Long term (current) use of oral hypoglycemic drugs: Secondary | ICD-10-CM | POA: Diagnosis not present

## 2020-09-15 DIAGNOSIS — Z79899 Other long term (current) drug therapy: Secondary | ICD-10-CM | POA: Insufficient documentation

## 2020-09-15 DIAGNOSIS — I4819 Other persistent atrial fibrillation: Secondary | ICD-10-CM | POA: Diagnosis present

## 2020-09-15 LAB — COMPREHENSIVE METABOLIC PANEL
ALT: 15 U/L (ref 0–44)
AST: 17 U/L (ref 15–41)
Albumin: 4.3 g/dL (ref 3.5–5.0)
Alkaline Phosphatase: 83 U/L (ref 38–126)
Anion gap: 10 (ref 5–15)
BUN: 22 mg/dL (ref 8–23)
CO2: 18 mmol/L — ABNORMAL LOW (ref 22–32)
Calcium: 9.4 mg/dL (ref 8.9–10.3)
Chloride: 110 mmol/L (ref 98–111)
Creatinine, Ser: 0.97 mg/dL (ref 0.61–1.24)
GFR, Estimated: >60 mL/min (ref >60)
Glucose, Bld: 95 mg/dL (ref 70–99)
Potassium: 4.2 mmol/L (ref 3.5–5.1)
Sodium: 138 mmol/L (ref 135–145)
Total Bilirubin: 1.3 mg/dL — ABNORMAL HIGH (ref 0.3–1.2)
Total Protein: 6.8 g/dL (ref 6.5–8.1)

## 2020-09-15 LAB — URINALYSIS, ROUTINE W REFLEX MICROSCOPIC
Bacteria, UA: NONE SEEN
Bilirubin Urine: NEGATIVE
Glucose, UA: >=500 mg/dL — AB
Hgb urine dipstick: NEGATIVE
Ketones, ur: 80 mg/dL — AB
Leukocytes,Ua: NEGATIVE
Nitrite: NEGATIVE
Protein, ur: NEGATIVE mg/dL
Specific Gravity, Urine: 1.027 (ref 1.005–1.030)
pH: 5 (ref 5.0–8.0)

## 2020-09-15 LAB — FOLATE: Folate: 8.2 ng/mL (ref >5.9)

## 2020-09-15 LAB — RAPID URINE DRUG SCREEN, HOSP PERFORMED
Amphetamines: NOT DETECTED
Barbiturates: NOT DETECTED
Benzodiazepines: NOT DETECTED
Cocaine: NOT DETECTED
Opiates: NOT DETECTED
Tetrahydrocannabinol: POSITIVE — AB

## 2020-09-15 LAB — CBC
HCT: 44.1 % (ref 39.0–52.0)
Hemoglobin: 14.2 g/dL (ref 13.0–17.0)
MCH: 28.8 pg (ref 26.0–34.0)
MCHC: 32.2 g/dL (ref 30.0–36.0)
MCV: 89.5 fL (ref 80.0–100.0)
Platelets: 180 10*3/uL (ref 150–400)
RBC: 4.93 MIL/uL (ref 4.22–5.81)
RDW: 13 % (ref 11.5–15.5)
WBC: 9.2 10*3/uL (ref 4.0–10.5)
nRBC: 0 % (ref 0.0–0.2)

## 2020-09-15 LAB — DIFFERENTIAL
Abs Immature Granulocytes: 0.03 10*3/uL (ref 0.00–0.07)
Basophils Absolute: 0.1 10*3/uL (ref 0.0–0.1)
Basophils Relative: 1 %
Eosinophils Absolute: 0.3 10*3/uL (ref 0.0–0.5)
Eosinophils Relative: 4 %
Immature Granulocytes: 0 %
Lymphocytes Relative: 18 %
Lymphs Abs: 1.6 10*3/uL (ref 0.7–4.0)
Monocytes Absolute: 0.5 10*3/uL (ref 0.1–1.0)
Monocytes Relative: 5 %
Neutro Abs: 6.6 10*3/uL (ref 1.7–7.7)
Neutrophils Relative %: 72 %

## 2020-09-15 LAB — TSH: TSH: 1.826 u[IU]/mL (ref 0.350–4.500)

## 2020-09-15 LAB — I-STAT CHEM 8, ED
BUN: 24 mg/dL — ABNORMAL HIGH (ref 8–23)
Calcium, Ion: 1.12 mmol/L — ABNORMAL LOW (ref 1.15–1.40)
Chloride: 109 mmol/L (ref 98–111)
Creatinine, Ser: 0.9 mg/dL (ref 0.61–1.24)
Glucose, Bld: 91 mg/dL (ref 70–99)
HCT: 42 % (ref 39.0–52.0)
Hemoglobin: 14.3 g/dL (ref 13.0–17.0)
Potassium: 4.2 mmol/L (ref 3.5–5.1)
Sodium: 140 mmol/L (ref 135–145)
TCO2: 18 mmol/L — ABNORMAL LOW (ref 22–32)

## 2020-09-15 LAB — I-STAT VENOUS BLOOD GAS, ED
Acid-base deficit: 4 mmol/L — ABNORMAL HIGH (ref 0.0–2.0)
Bicarbonate: 19.5 mmol/L — ABNORMAL LOW (ref 20.0–28.0)
Calcium, Ion: 1.17 mmol/L (ref 1.15–1.40)
HCT: 39 % (ref 39.0–52.0)
Hemoglobin: 13.3 g/dL (ref 13.0–17.0)
O2 Saturation: 95 %
Potassium: 3.8 mmol/L (ref 3.5–5.1)
Sodium: 141 mmol/L (ref 135–145)
TCO2: 20 mmol/L — ABNORMAL LOW (ref 22–32)
pCO2, Ven: 31.8 mmHg — ABNORMAL LOW (ref 44.0–60.0)
pH, Ven: 7.395 (ref 7.250–7.430)
pO2, Ven: 73 mmHg — ABNORMAL HIGH (ref 32.0–45.0)

## 2020-09-15 LAB — APTT: aPTT: 34 seconds (ref 24–36)

## 2020-09-15 LAB — RESP PANEL BY RT-PCR (FLU A&B, COVID) ARPGX2
Influenza A by PCR: NEGATIVE
Influenza B by PCR: NEGATIVE
SARS Coronavirus 2 by RT PCR: NEGATIVE

## 2020-09-15 LAB — AMMONIA: Ammonia: <9 umol/L — ABNORMAL LOW (ref 9–35)

## 2020-09-15 LAB — ETHANOL: Alcohol, Ethyl (B): <10 mg/dL (ref <10)

## 2020-09-15 LAB — CBG MONITORING, ED
Glucose-Capillary: 89 mg/dL (ref 70–99)
Glucose-Capillary: 86 mg/dL (ref 70–99)

## 2020-09-15 LAB — VITAMIN B12: Vitamin B-12: 85 pg/mL — ABNORMAL LOW (ref 180–914)

## 2020-09-15 LAB — PROTIME-INR
INR: 1.4 — ABNORMAL HIGH (ref 0.8–1.2)
Prothrombin Time: 16.6 seconds — ABNORMAL HIGH (ref 11.4–15.2)

## 2020-09-15 IMAGING — MR MR HEAD W/O CM
6 of 11 series · 24 of 48 positions shown · non-contrast
Comparison: Prior head CT from earlier the same day.

CLINICAL DATA: Initial evaluation for neuro deficit, stroke
suspected./

EXAM:
MRI HEAD WITHOUT CONTRAST
TECHNIQUE: Multiplanar, multiecho pulse sequences of the brain and surrounding
structures were obtained without intravenous contrast.

[Series 2: DWI · axial · 3.0mm · 0.94mm/px · z∈[-67,+84]mm · 8 of 104 slices shown (1 of 2)]
[im 1/104]
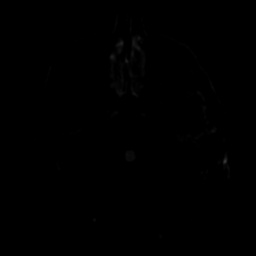
[im 15/104]
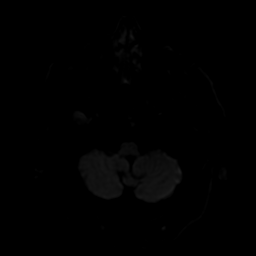
[im 30/104]
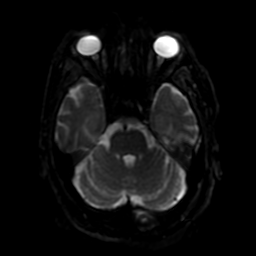
[im 45/104]
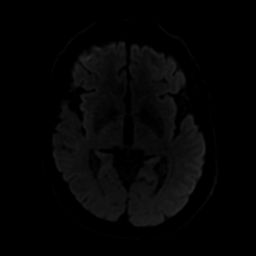
[im 59/104]
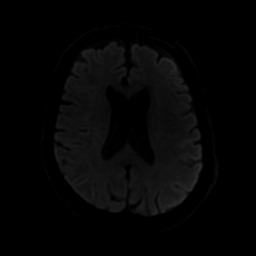
[im 74/104]
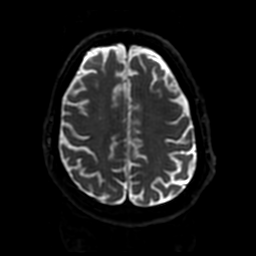
[im 89/104]
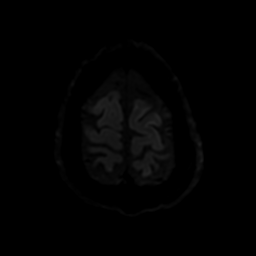
[im 104/104]
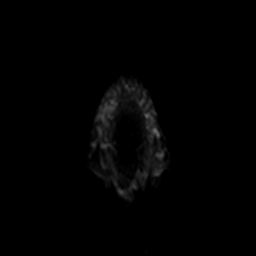

[Series 3: DWI · coronal · 4.0mm · 0.94mm/px · 5 of 74 slices shown (2 of 2)]
[im 1/74]
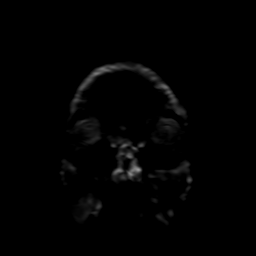
[im 19/74]
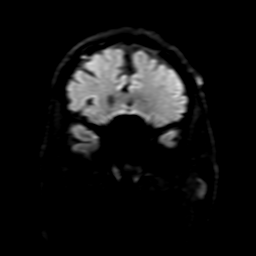
[im 37/74]
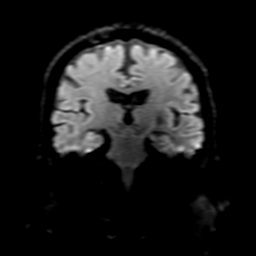
[im 55/74]
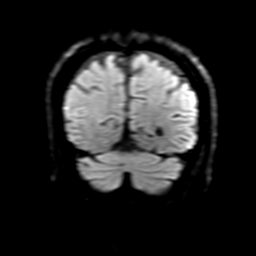
[im 74/74]
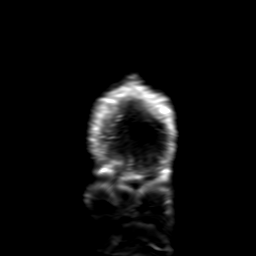

[Series 4: FLAIR · sagittal · 5.0mm · 0.23mm/px · 2 of 25 slices shown]
[im 1/25]
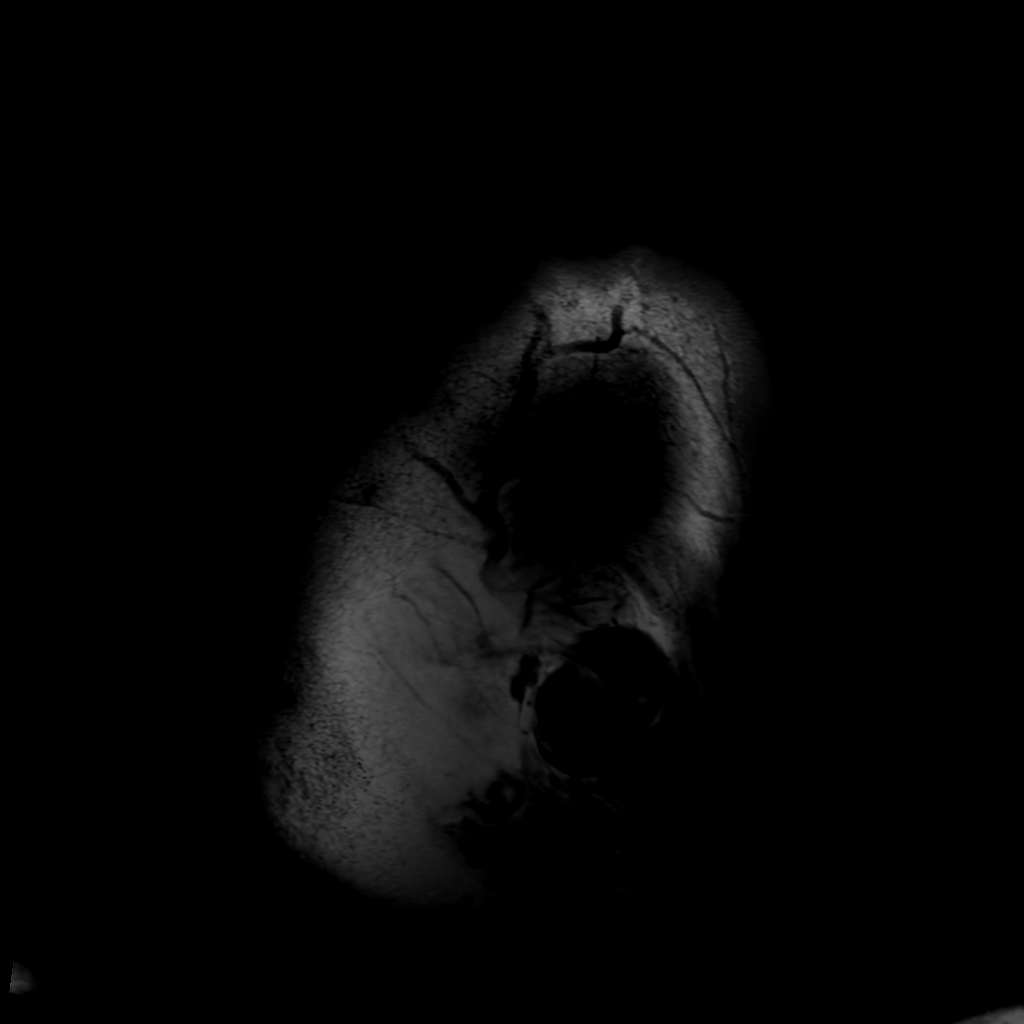
[im 25/25]
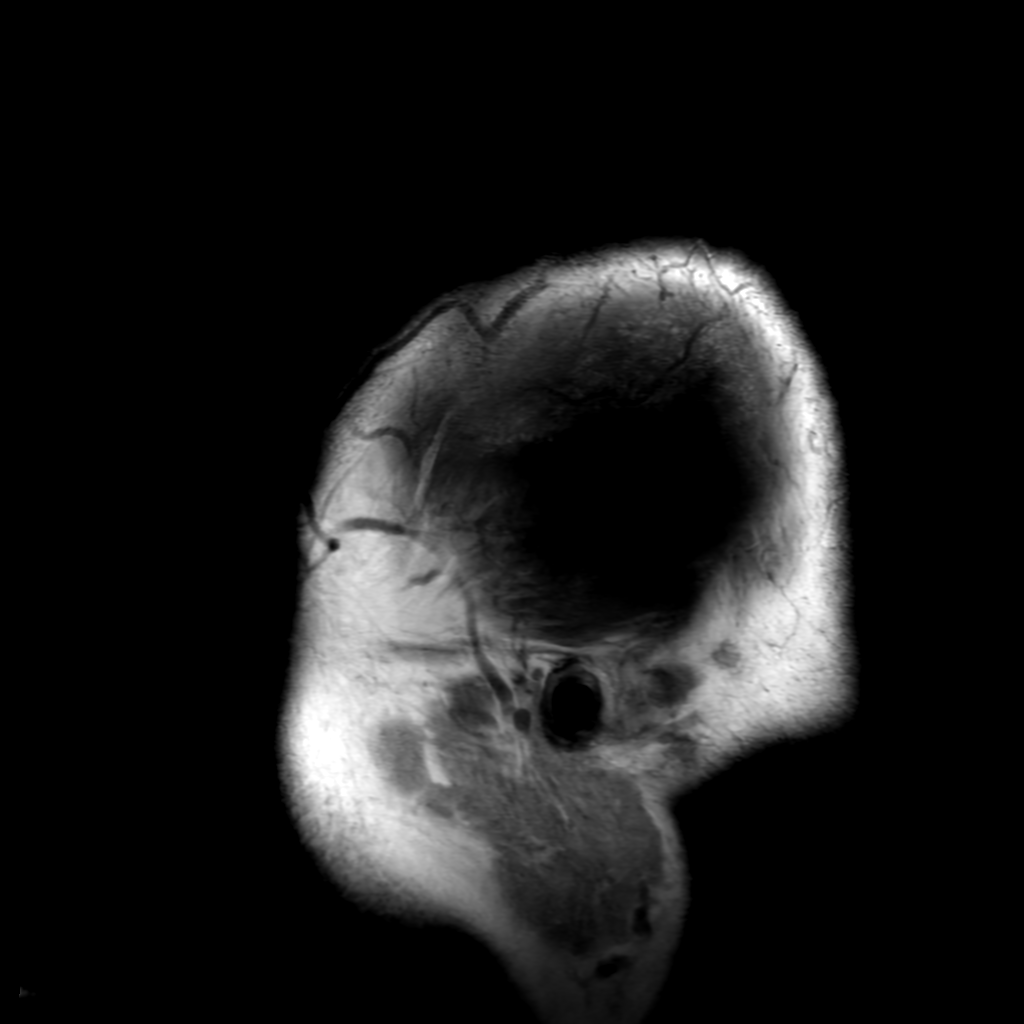

[Series 6: FLAIR fat-sat · axial · 3.0mm · 0.47mm/px · z∈[-73,+82]mm · 2 of 27 slices shown]
[im 1/27]
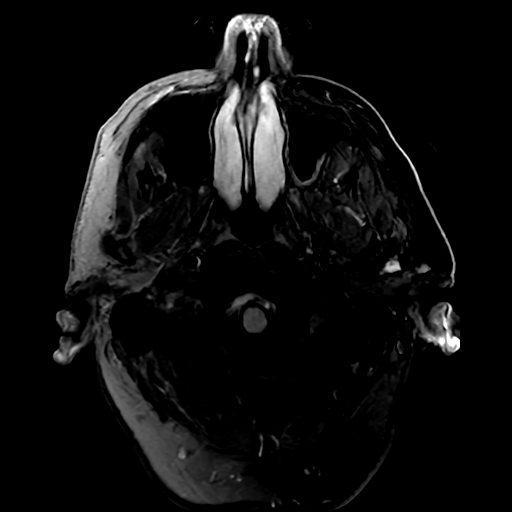
[im 27/27]
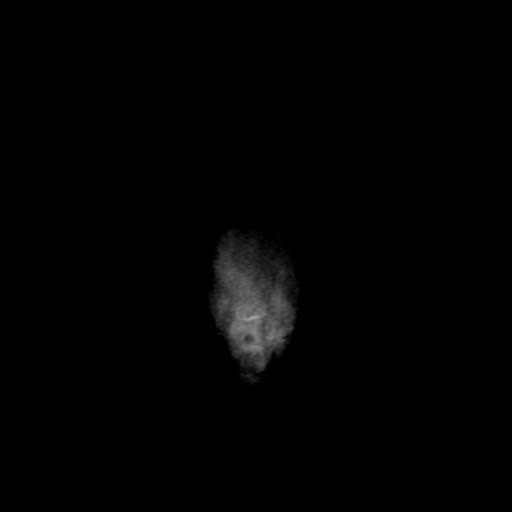

[Series 250: ADC · axial · 3.0mm · 0.94mm/px · z∈[-67,+84]mm · 4 of 52 slices shown (1 of 2)]
[im 1/52]
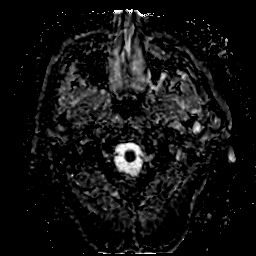
[im 18/52]
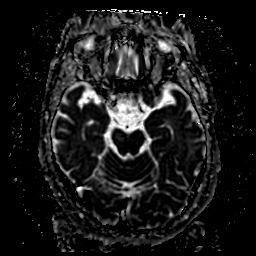
[im 35/52]
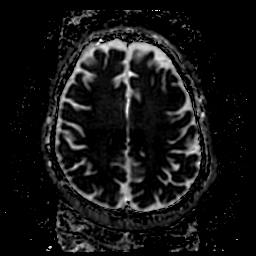
[im 52/52]
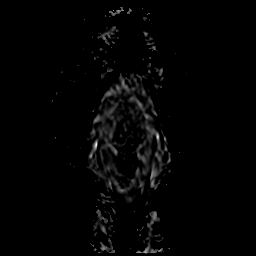

[Series 350: ADC · coronal · 4.0mm · 0.94mm/px · 3 of 37 slices shown (2 of 2)]
[im 1/37]
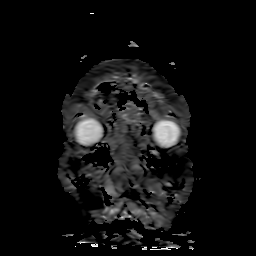
[im 19/37]
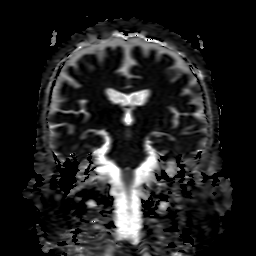
[im 37/37]
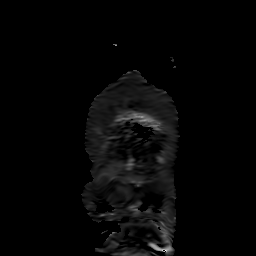

[24 of 48 positions shown; findings below may reference images not displayed]

FINDINGS: Brain: Examination degraded by motion artifact.

Generalized age-related cerebral atrophy. Patchy T2/FLAIR
hyperintensity within the periventricular and deep white matter both
cerebral hemispheres most consistent with chronic small vessel
ischemic disease, mild in nature. Small remote right cerebellar
infarct noted.

No abnormal foci of restricted diffusion to suggest acute or
subacute ischemia. Gray-white matter differentiation maintained. No
encephalomalacia to suggest chronic cortical infarction. No evidence
for acute or chronic intracranial hemorrhage.

No mass lesion, midline shift or mass effect. No hydrocephalus or
extra-axial fluid collection. Pituitary gland suprasellar region
normal. Midline structures intact.

Vascular: Major intracranial vascular flow voids are maintained.

Skull and upper cervical spine: Craniocervical junction within
normal limits. Bone marrow signal intensity normal. No scalp soft
tissue abnormality.

Sinuses/Orbits: Globes and orbital soft tissues within normal
limits. Mild scattered mucosal thickening noted within the ethmoidal
air cells and maxillary sinuses. Paranasal sinuses are otherwise
clear. No mastoid effusion. Inner ear structures grossly normal.

Other: None.
IMPRESSION: 1. No acute intracranial abnormality.
2. Generalized age-related cerebral atrophy with mild chronic small
vessel ischemic disease, with small remote right cerebellar infarct.

## 2020-09-15 IMAGING — CT CT HEAD CODE STROKE
3 series · 14 of 47 positions shown, 16 images · non-contrast
Comparison: CT head [DATE].

CLINICAL DATA: Code stroke.  Neuro deficit, acute stroke suspected.

EXAM:
CT HEAD WITHOUT CONTRAST
TECHNIQUE: Contiguous axial images were obtained from the base of the skull
through the vertex without intravenous contrast.

[Series 2: head 5.0 h30s · axial · 0.47mm/px · z∈[-33,+117]mm · 8 of 36 slices shown, 10 images]
[im 3/36  brain]
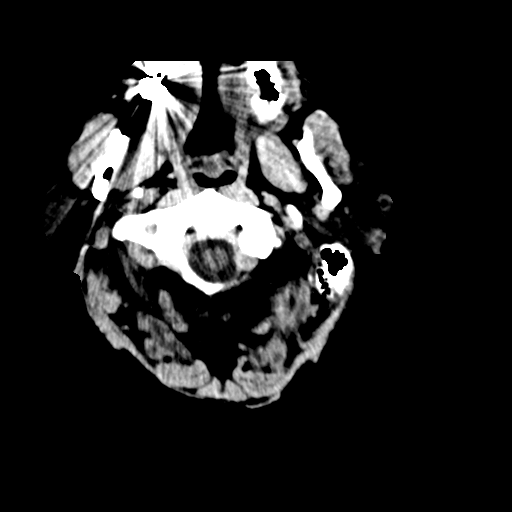
[im 3/36  bone]
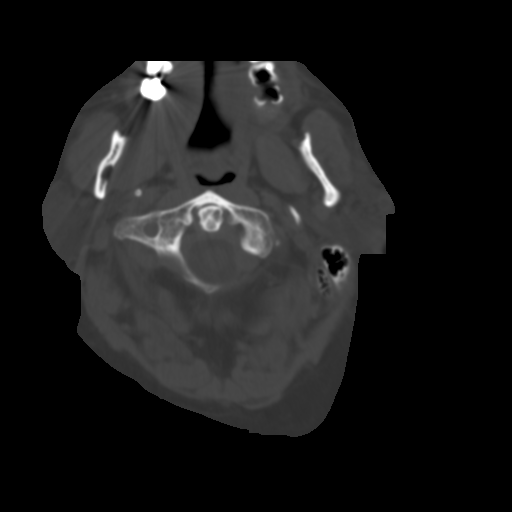
[im 8/36  brain]
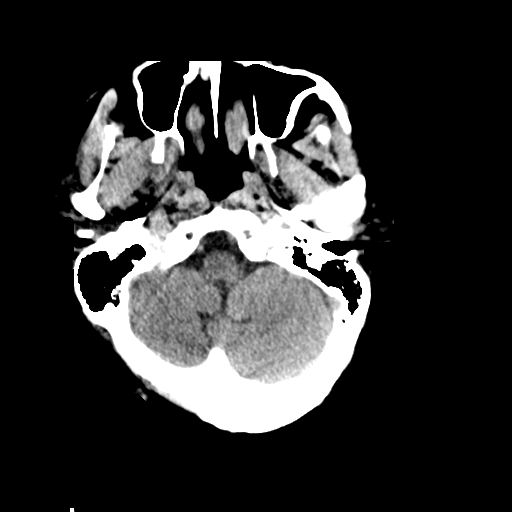
[im 11/36  brain]
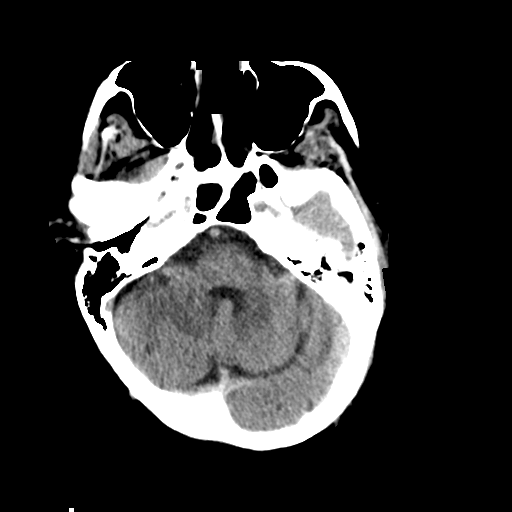
[im 16/36  brain]
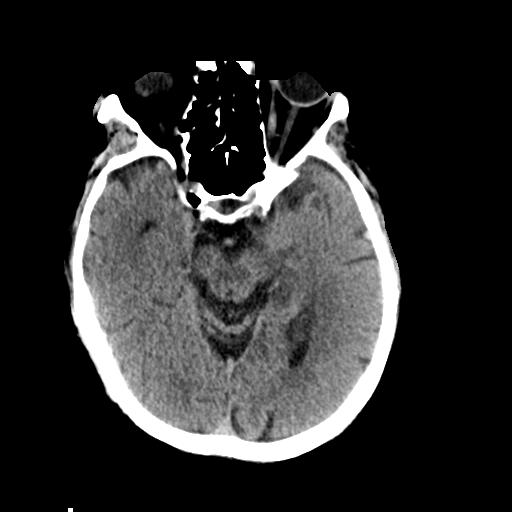
[im 20/36  brain]
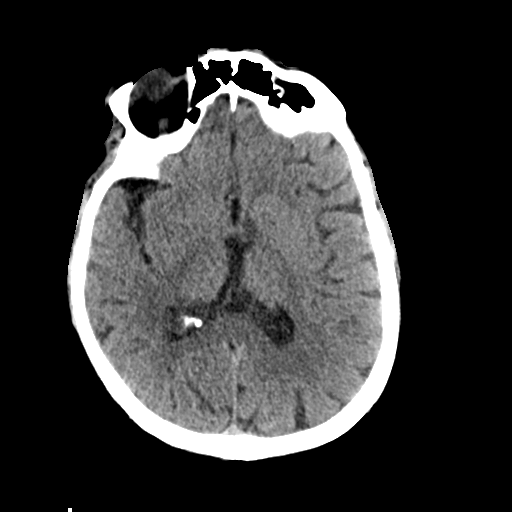
[im 20/36  bone]
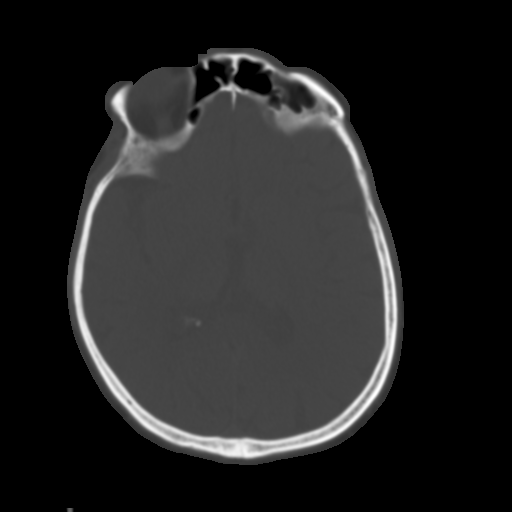
[im 25/36  brain]
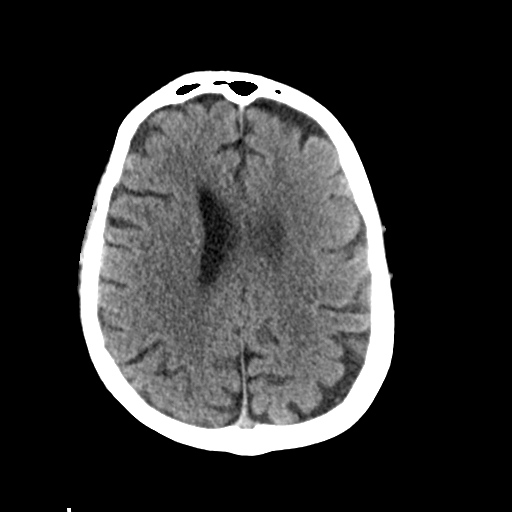
[im 28/36  brain]
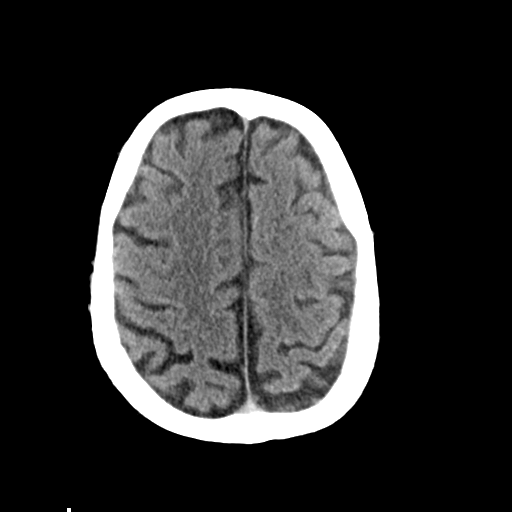
[im 33/36  brain]
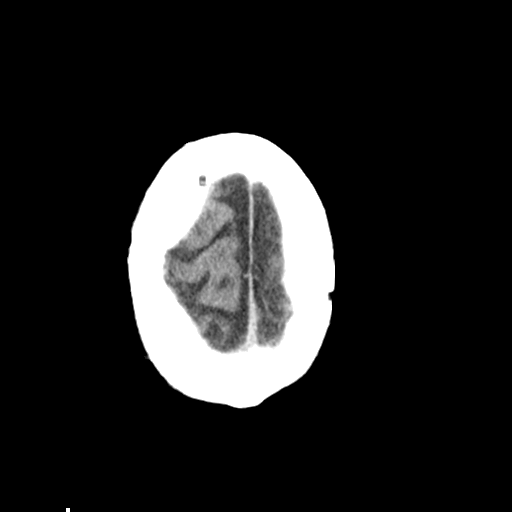

[Series 5: head 3.0 mpr cor · coronal · 0.35mm/px · 3 of 67 slices shown]
[im 23/67  brain]
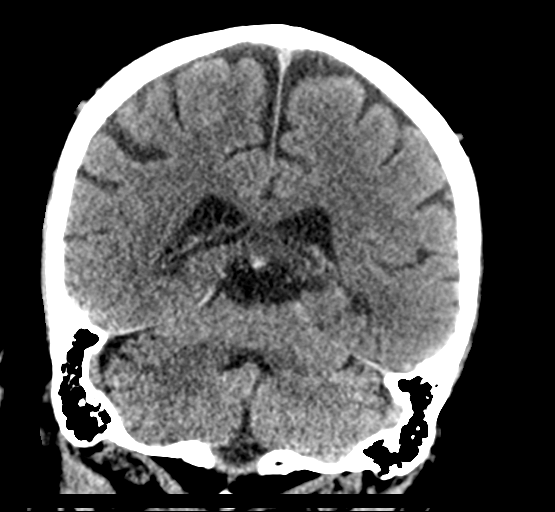
[im 30/67  brain]
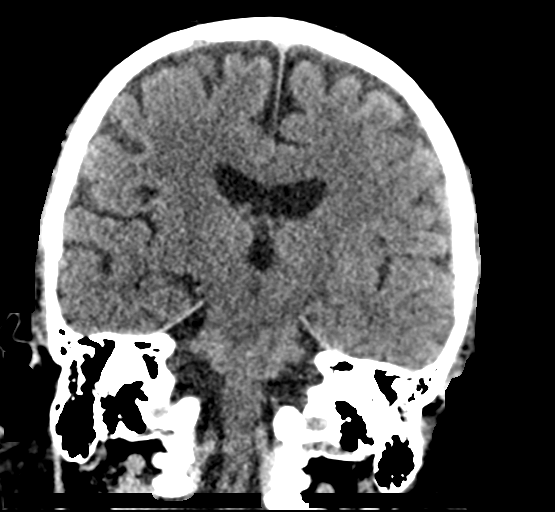
[im 37/67  brain]
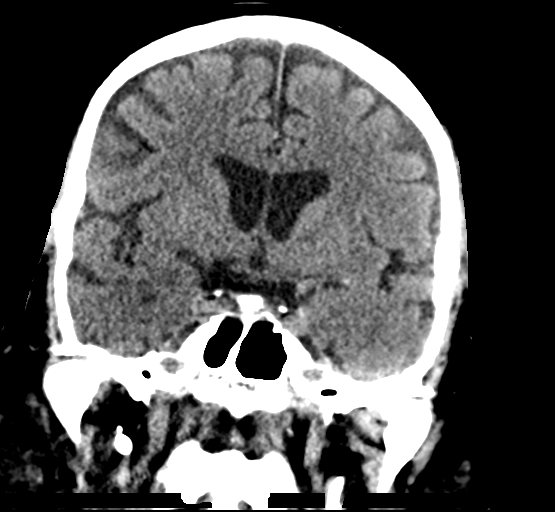

[Series 6: head 3.0 mpr sag · sagittal · 0.35mm/px · 3 of 67 slices shown]
[im 23/67  brain]
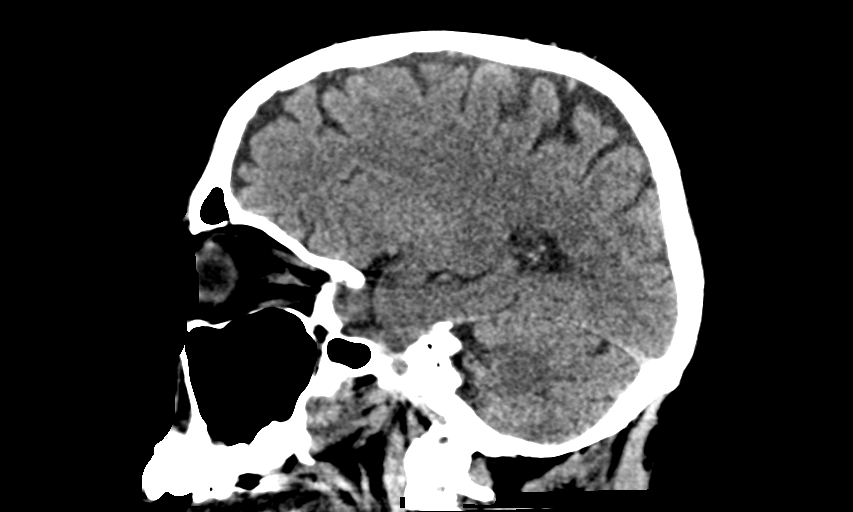
[im 34/67  brain]
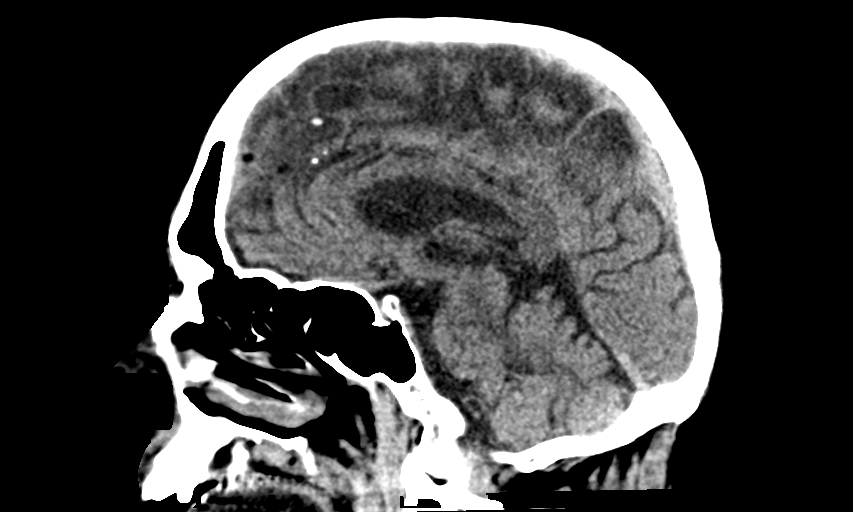
[im 45/67  brain]
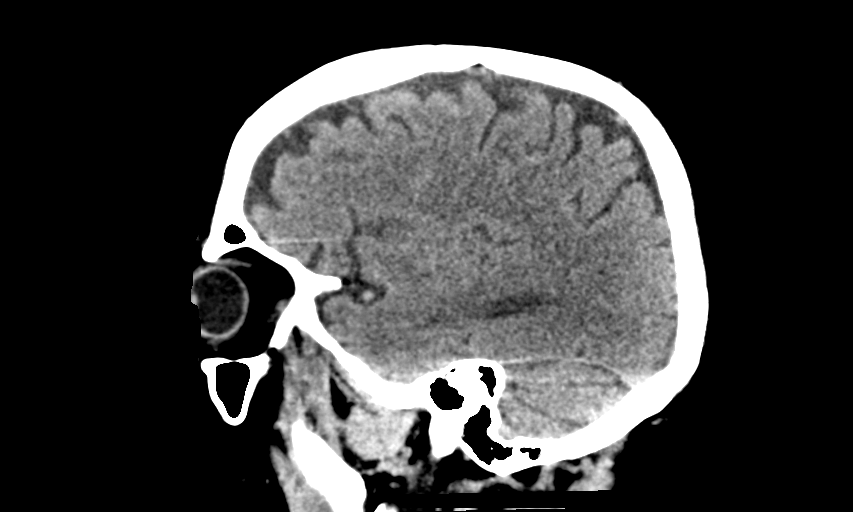

[14 of 47 positions shown; findings below may reference images not displayed]

FINDINGS: Brain: No evidence of acute large vascular territory infarction,
hemorrhage, hydrocephalus, extra-axial collection or mass
lesion/mass effect. Small remote lacunar infarct in the right
cerebellum.

Vascular: No hyperdense vessel identified.

Skull: No acute fracture.

Sinuses/Orbits: Mild ethmoid air cell mucosal thickening. Mild
mucosal thickening of the inferior maxillary sinuses. Unremarkable
orbits.

Other: No mastoid effusions.

ASPECTS ([place] Stroke Program Early CT Score) Total score (0-10
with 10 being normal): 10.
IMPRESSION: 1. No evidence of acute intracranial abnormality.
2. ASPECTS is 10.

Code stroke imaging results were communicated on [DATE] at [DATE]
to provider Dr. GUDANI via secure text paging.

## 2020-09-15 MED ORDER — ATORVASTATIN CALCIUM 10 MG PO TABS
20.0000 mg | ORAL_TABLET | Freq: Every day | ORAL | Status: DC
  Administered 2020-09-15: 20 mg via ORAL
  Filled 2020-09-15: qty 2

## 2020-09-15 MED ORDER — LOSARTAN POTASSIUM 50 MG PO TABS
50.0000 mg | ORAL_TABLET | Freq: Every day | ORAL | Status: DC
  Administered 2020-09-16: 50 mg via ORAL
  Filled 2020-09-15: qty 1

## 2020-09-15 MED ORDER — SODIUM CHLORIDE 0.9% FLUSH
3.0000 mL | Freq: Once | INTRAVENOUS | Status: AC
Start: 2020-09-15 — End: 2020-09-15
  Administered 2020-09-15: 3 mL via INTRAVENOUS

## 2020-09-15 MED ORDER — TRIAZOLAM 0.125 MG PO TABS: 0.2500 mg | ORAL_TABLET | Freq: Every evening | ORAL | Status: DC | PRN

## 2020-09-15 MED ORDER — ALFUZOSIN HCL ER 10 MG PO TB24
10.0000 mg | ORAL_TABLET | Freq: Every day | ORAL | Status: DC
Start: 2020-09-16 — End: 2020-09-16
  Administered 2020-09-16: 10 mg via ORAL
  Filled 2020-09-15: qty 1

## 2020-09-15 MED ORDER — THIAMINE HCL 100 MG PO TABS
100.0000 mg | ORAL_TABLET | Freq: Every day | ORAL | Status: DC
  Administered 2020-09-15 – 2020-09-16 (×2): 100 mg via ORAL
  Filled 2020-09-15 (×3): qty 1

## 2020-09-15 MED ORDER — THIAMINE HCL 100 MG/ML IJ SOLN: 100.0000 mg | Freq: Every day | INTRAMUSCULAR | Status: DC

## 2020-09-15 MED ORDER — RIVAROXABAN 20 MG PO TABS
20.0000 mg | ORAL_TABLET | Freq: Every day | ORAL | Status: DC
  Administered 2020-09-15: 20 mg via ORAL
  Filled 2020-09-15: qty 1

## 2020-09-15 MED ORDER — CYANOCOBALAMIN 1000 MCG/ML IJ SOLN
1000.0000 ug | Freq: Every day | INTRAMUSCULAR | Status: DC
  Administered 2020-09-15 – 2020-09-16 (×2): 1000 ug via INTRAMUSCULAR
  Filled 2020-09-15 (×3): qty 1

## 2020-09-15 MED ORDER — SPIRONOLACTONE 25 MG PO TABS
25.0000 mg | ORAL_TABLET | Freq: Two times a day (BID) | ORAL | Status: DC
  Administered 2020-09-15 – 2020-09-16 (×2): 25 mg via ORAL
  Filled 2020-09-15 (×3): qty 1

## 2020-09-15 MED ORDER — METOPROLOL TARTRATE 12.5 MG HALF TABLET
12.5000 mg | ORAL_TABLET | Freq: Two times a day (BID) | ORAL | Status: DC
  Administered 2020-09-15 – 2020-09-16 (×2): 12.5 mg via ORAL
  Filled 2020-09-15 (×2): qty 1

## 2020-09-15 NOTE — ED Notes (Signed)
Patient transported to MRI 

## 2020-09-15 NOTE — ED Notes (Signed)
Pt still at [facility]

## 2020-09-15 NOTE — Consult Note (Signed)
NEUROLOGY CONSULTATION NOTE   Date of service: September 15, 2020 Patient Name: Clinton Black MRN:  [id number] DOB:  1946-04-22 Reason for consult: "Stroke code" _ _ _   _ __   _ __ _ _  __ __   _ __   __ _  History of Present Illness  Clinton Black is a 75 y.o. male with PMH significant for  HTN, Dm2, BPH, hx of R Bells palsy, chronic diastolic CHF, pAfibb on Xarelto  Who presents with R facial droop and confusion with a  LKW 1600.  Wife reports it was a normal day, he was going back to lay down and rest. Wife was sitting up front, came running up calling for his dogs that died a few years ago, did that 2 more times. Wife called their nieghbor who checked his blood pressure and it as normal. Thought he was sleep walking or just confused. He has done before. No hx of seizures.  No fever, no recent URI, UTI or gastroenteritis like symptoms.  He did the same again and came out crying for his dogs.This time, they called EMS. Noted to have a R facial droop and confused and thus brought in as a stroke code.  NIHSS of 2 for R facial droop and not oriented to month. MRS: 0 per review of charts. TPA: No, on Xarelto, last dose yesterday night Thrombectomy: Low concern for LVO and low NIHSS.   ROS   Denies any headaches,  Past History   Past Medical History:  Diagnosis Date  . Arthritis   . Atrial fibrillation ([place])   . BPH (benign prostatic hyperplasia)   . Diabetes mellitus type 2 in obese (HCC)    diet controlled  . HTN (hypertension)   . Hyperlipidemia, mixed   . Insomnia   . Obesity   . Thoracic aortic aneurysm, without rupture (HCC)    4.7 cm per chest ct with contrast 11-04-17   Past Surgical History:  Procedure Laterality Date  . AORTIC VALVE REPLACEMENT N/A 10/16/2019   Procedure: AORTIC VALVE REPLACEMENT (AVR) using Edwards PERIMOUNT Magna Ease 25MM Bioprosthesis Aortic Valve.;  Surgeon: [name], MD;  Location: [place];  Service: Open Heart Surgery;  Laterality: N/A;   Right subclavian artery cannulation.  . AORTIC VALVE REPLACEMENT  10/16/2019   AORTIC VALVE REPLACEMENT (AVR)   . ATRIAL FIBRILLATION ABLATION N/A 04/07/2019   Procedure: ATRIAL FIBRILLATION ABLATION;  Surgeon: [name], MD;  Location: [place] [facility];  Service: Cardiovascular;  Laterality: N/A;  . CARDIOVERSION N/A 07/12/2018   Procedure: CARDIOVERSION;  Surgeon: Thayer Headings, MD;  [facility];  Service: Cardiovascular;  Laterality: N/A;  . CARDIOVERSION N/A 08/22/2018   Procedure: CARDIOVERSION;  Surgeon: Skeet Latch, MD;  Location: [place];  Service: Cardiovascular;  Laterality: N/A;  . CARDIOVERSION N/A 06/28/2019   Procedure: CARDIOVERSION;  Surgeon: Thayer Headings, MD;  Location: [place];  Service: Cardiovascular;  Laterality: N/A;  . CLIPPING OF ATRIAL APPENDAGE N/A 10/16/2019   Procedure: Clipping Of Atrial Appendage using AtriCure AtriClip Exclusion VLAA 7mm.;  Surgeon: [name], MD;  Location: [place];  Service: Open Heart Surgery;  Laterality: N/A;  . COLONOSCOPY  2016  . CYST EXCISION     polynomial  . CYSTOSCOPY WITH INSERTION OF UROLIFT N/A 04/18/2018   Procedure: CYSTOSCOPY WITH INSERTION OF UROLIFT;  Surgeon: Cleon Gustin, MD;  Location: [facility];  Service: Urology;  Laterality: N/A;  . KNEE  SURGERY Right 2000   arthroscopy  . MAZE N/A 10/16/2019   Procedure: MAZE with bilateral pulmonary vein isolation.;  Surgeon: [name], MD;  Location: [place];  Service: Open Heart Surgery;  Laterality: N/A;  Maze procedure using Atricure OLL2 Isolator clamp.  . REPLACEMENT ASCENDING AORTA N/A 10/16/2019   Procedure: SUPRA CORONARY REPLACEMENT OF ASCENDING AORTA using Maquet Hemashiel Platinum 34 MM Vascular Graft.;  Surgeon: [name], MD;  Location: [place];  Service: Open Heart Surgery;  Laterality: N/A;  Right subclavian artery cannulation.  Marland Kitchen RIGHT/LEFT HEART CATH AND CORONARY ANGIOGRAPHY N/A  01/30/2019   Procedure: RIGHT/LEFT HEART CATH AND CORONARY ANGIOGRAPHY;  Surgeon: [name], [name], MD;  Location: [place] [facility];  Service: Cardiovascular;  Laterality: N/A;  . TEE WITHOUT CARDIOVERSION N/A 10/16/2019   Procedure: TRANSESOPHAGEAL ECHOCARDIOGRAM (TEE);  Surgeon: [name], MD;  Location: [place];  Service: Open Heart Surgery;  Laterality: N/A;  . TONSILLECTOMY    . widson teeth extraction     Family History  Problem Relation Age of Onset  . Aneurysm Father   . Heart disease Father   . Varicose Veins Mother   . Arthritis Mother   . Macular degeneration Mother   . Cancer Neg Hx   . Colon cancer Neg Hx   . Esophageal cancer Neg Hx   . Rectal cancer Neg Hx   . Stomach cancer Neg Hx    Social History   Socioeconomic History  . Marital status: Single    Spouse name: Not on file  . Number of children: Not on file  . Years of education: Not on file  . Highest education level: Not on file  Occupational History  . Not on file  Tobacco Use  . Smoking status: Former Smoker    Packs/day: 1.50    Years: 5.00    Pack years: 7.50    Types: Cigarettes    Quit date: 06/15/1994    Years since quitting: 26.2  . Smokeless tobacco: Never Used  Vaping Use  . Vaping Use: Never used  Substance and Sexual Activity  . Alcohol use: Not Currently    Alcohol/week: 14.0 standard drinks    Types: 14 Shots of liquor per week    Comment: 1 drink of liquor per day  . Drug use: Yes    Types: Marijuana    Comment: occasionally marijuana  . Sexual activity: Yes    Partners: Female    Birth control/protection: Condom    Comment: condom use most of the time but not always  Other Topics Concern  . Not on file  [place] [employer] and [employer]. married '72- 3 years/divorced; married '89- 3 years/divorced;. married '03 - 3 years/divorced. 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   Social Determinants of Health   Financial Resource Strain: Low Risk   .  Difficulty of Paying Living Expenses: Not hard at all  Food Insecurity: No Food Insecurity  . Worried About Charity fundraiser in the Last Year: Never true  . Ran Out of Food in the Last Year: Never true  Transportation Needs: No Transportation Needs  . Lack of Transportation (Medical): No  . Lack of Transportation (Non-Medical): No  Physical Activity: Sufficiently Active  . Days of Exercise per Week: 7 days  . Minutes of Exercise per Session: 30 min  Stress: Stress Concern Present  . Feeling of Stress : To some extent  Social Connections: Not on  file   Allergies  Allergen Reactions  . Quinolones Other (See Comments)    Patient was warned about not using Cipro and similar antibiotics. Recent studies have raised concern that fluoroquinolone antibiotics could be associated with an increased risk of aortic aneurysm Fluoroquinolones have non-antimicrobial properties that might jeopardise the integrity of the extracellular matrix of the vascular wall In a  propensity score matched cohort study in [place], there was a 66% increased rate of aortic aneurysm or dissection associated with oral fluoroquinolone use, compared wit    Medications  (Not in a hospital admission)    Vitals   Vitals:   09/15/20 1802 09/15/20 1804  BP: 107/71   Pulse: 68   Resp: 16   Temp: 98.2 F (36.8 C)   SpO2: 95%   Weight:  114.3 kg  Height:  6\' 4"  (1.93 m)     Body mass index is 30.67 kg/m.  Physical Exam   General: Laying comfortably in bed; in no acute distress.  HENT: Normal oropharynx and mucosa. Normal external appearance of ears and nose.  Neck: Supple, no pain or tenderness  CV: No JVD. No peripheral edema.  Pulmonary: Symmetric Chest rise. Normal respiratory effort.  Abdomen: Soft to touch, non-tender.  Ext: No cyanosis, edema, or deformity  Skin: No rash. Normal palpation of skin.   Musculoskeletal: Normal digits and nails by inspection. No clubbing.   Neurologic Examination   Mental status/Cognition: Alert, oriented to self only, good attention.  Speech/language: Fluent, comprehension intact, object naming intact, repetition intact. Cranial nerves:   CN II Pupils equal and reactive to light, no VF deficits   CN III,IV,VI EOM intact, no gaze preference or deviation, no nystagmus    CN V normal sensation in V1, V2, and V3 segments bilaterally    CN VII no asymmetry, no nasolabial fold flattening    CN VIII normal hearing to speech    CN IX & X normal palatal elevation, no uvular deviation    CN XI 5/5 head turn and 5/5 shoulder shrug bilaterally    CN XII midline tongue protrusion    Motor:  Muscle bulk: normal, tone normal, pronator drift none tremor none Mvmt Root Nerve  Muscle Right Left Comments  SA C5/6 Ax Deltoid 5 5   EF C5/6 Mc Biceps     EE C6/7/8 Rad Triceps     WF C6/7 Med FCR     WE C7/8 PIN ECU     F Ab C8/T1 U ADM/FDI 5 5   HF L1/2/3 Fem Illopsoas 5 5   KE L2/3/4 Fem Quad     DF L4/5 D Peron Tib Ant     PF S1/2 Tibial Grc/Sol      Reflexes:  Right Left Comments  Pectoralis      Biceps (C5/6) 2 2   Brachioradialis (C5/6) 2 2    Triceps (C6/7) 2 2    Patellar (L3/4) 2 2    Achilles (S1)      Hoffman      Plantar     Jaw jerk    Sensation:  Light touch intact   Pin prick    Temperature    Vibration   Proprioception    Coordination/Complex Motor:  - Finger to Nose intact - Heel to shin intact - Rapid alternating movement are normal - Gait: Deferred.  Labs   CBC:  Recent Labs  Lab 09/15/20 1740  WBC 9.2  NEUTROABS 6.6  HGB 14.2  HCT 44.1  MCV  89.5  PLT 888    Basic Metabolic Panel:  Lab Results  Component Value Date   NA 142 09/02/2020   K 4.8 09/02/2020   CO2 26 09/02/2020   GLUCOSE 104 (H) 09/02/2020   BUN 17 09/02/2020   CREATININE 0.97 09/02/2020   CALCIUM 9.7 09/02/2020   GFRNONAA >60 03/22/2020   GFRAA >60 03/14/2020   Lipid Panel:  Lab Results  Component Value Date   LDLCALC 69 09/02/2020    HgbA1c:  Lab Results  Component Value Date   HGBA1C 6.2 09/02/2020   Urine Drug Screen: No results found for: LABOPIA, COCAINSCRNUR, LABBENZ, AMPHETMU, THCU, LABBARB  Alcohol Level     Component Value Date/Time   ETH <10 03/20/2020 1430    CT Head without contrast: CTH was negative for a large hypodensity concerning for a large territory infarct or hyperdensity concerning for an ICH  MRI Brain pending  Impression   LAVERT MATOUSEK is a 75 y.o. male with PMH significant for HTN, Dm2, BPH, hx of R Bells palsy, chronic diastolic CHF, pAfibb on Xarelto  Who presents with R facial droop and confusion with a  LKW 1600. He has off and on R facial droop from his hx of bells palsy. His presentation is more consistent with encephalopathy rather than stroke with no focal deficit on exam.  Recommendations  - Recommend MRI Brain without contrast. No further imaging if negative. - Vit B12, folate, B1 with thiamine replacement given recent significant weight loss, thou intentional. ______________________________________________________________________   Thank you for the opportunity to take part in the care of this patient. If you have any further questions, please contact the neurology consultation attending.  Signed,  [place] Pager Number [phone] _ _ _   _ __   _ __ _ _  __ __   _ __   __ _

## 2020-09-15 NOTE — ED Provider Notes (Signed)
[facility] EMERGENCY DEPARTMENT Provider Note   CSN: [id number] Arrival date & time: 09/15/20  1736  An emergency department physician performed an initial assessment on this suspected stroke patient at 1739.  History Chief Complaint  Patient presents with  . Code Stroke    LKW [place] is a 75 y.o. male.  Patient is a 75 year old male with a history of A. fib, diabetes, hypertension, hyperlipidemia, and thoracic aortic aneurysm who presents with concerns for facial weakness.  Patient presents as a code stroke.  Code stroke was called prior to arrival to the hospital.  Last known normal was around 1600 this afternoon.  Wife noted patient had a right-sided facial droop and confusion.  EMS was called out to the house.  They also noted a right-sided facial droop.  Patient does not report any other abnormalities.  He does not have any weakness in any of his extremities.  He denies any changes in sensation.  Denies any visual changes.  Denies any headaches.  He has no chest pain or shortness of breath.  Denies abdominal pain, diarrhea, nausea, and vomiting. Patient confusing timeline of events of yesterday and today.     Past Medical History:  Diagnosis Date  . Arthritis   . Atrial fibrillation ([place])   . BPH (benign prostatic hyperplasia)   . Diabetes mellitus type 2 in obese (HCC)    diet controlled  . HTN (hypertension)   . Hyperlipidemia, mixed   . Insomnia   . Obesity   . Thoracic aortic aneurysm, without rupture (HCC)    4.7 cm per chest ct with contrast 11-04-17    Patient Active Problem List   Diagnosis Date Noted  . Acute encephalopathy 09/15/2020  . PSA elevation 09/08/2020  . Left-sided chest pain 09/02/2020  . DDD (degenerative disc disease), cervical 03/28/2020  . DDD (degenerative disc disease), lumbar 03/28/2020  . Syncope and collapse 03/20/2020  . 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 BRBPR (bright red blood per rectum)  09/06/2019  . Mucopurulent chronic bronchitis ([place]) 05/30/2019  . Benign prostatic hyperplasia without lower urinary tract symptoms 05/29/2019  . BPH with elevated PSA 05/29/2019  . Thoracic aortic aneurysm ([place]) 01/30/2019  . TSH elevation 10/22/2018  . Persistent atrial fibrillation ([place]) 06/01/2018  . Claudication of both lower extremities ([place]) 07/15/2017  . Chronic diastolic CHF (congestive heart failure), NYHA class 2 ([place]) 07/06/2017  . Mild tetrahydrocannabinol (THC) abuse 12/30/2016  . Essential hypertension 12/24/2016  . Hyperlipidemia LDL goal <130 05/11/2016  . Type 2 diabetes mellitus with complication, without long-term current use of insulin ([place]) 05/11/2016  . Routine general medical examination at a health care facility 10/06/2013  . COLONIC POLYPS 05/28/2009  . Obesity, morbid ([place]) 05/28/2009  . 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 DEGENERATIVE JOINT DISEASE 05/28/2009  . Insomnia w/ sleep apnea 05/28/2009    Past Surgical History:  Procedure Laterality Date  . AORTIC VALVE REPLACEMENT N/A 10/16/2019   Procedure: AORTIC VALVE REPLACEMENT (AVR) using Edwards PERIMOUNT Magna Ease 25MM Bioprosthesis Aortic Valve.;  Surgeon: [name], MD;  Location: [facility];  Service: Open Heart Surgery;  Laterality: N/A;  Right subclavian artery cannulation.  . AORTIC VALVE REPLACEMENT  10/16/2019   AORTIC VALVE REPLACEMENT (AVR)   . ATRIAL FIBRILLATION ABLATION N/A 04/07/2019   Procedure: ATRIAL FIBRILLATION ABLATION;  Surgeon: [name], MD;  Location: [facility] CV LAB;  Service: Cardiovascular;  Laterality: N/A;  . CARDIOVERSION N/A 07/12/2018  Procedure: CARDIOVERSION;  Surgeon: Thayer Headings, MD;  Location[facility] ENDOSCOPY;  Service: Cardiovascular;  Laterality: N/A;  . CARDIOVERSION N/A 08/22/2018   Procedure: CARDIOVERSION;  Surgeon: Skeet Latch, MD;  Location: [facility] ENDOSCOPY;  Service: Cardiovascular;  Laterality: N/A;  . CARDIOVERSION N/A 06/28/2019    Procedure: CARDIOVERSION;  Surgeon: Thayer Headings, MD;  Location: [facility];  Service: Cardiovascular;  Laterality: N/A;  . CLIPPING OF ATRIAL APPENDAGE N/A 10/16/2019   Procedure: Clipping Of Atrial Appendage using AtriCure AtriClip Exclusion VLAA 26m.;  Surgeon: GGrace Isaac MD;  [place] [facility]  Service: Open Heart Surgery;  Laterality: N/A;  . COLONOSCOPY  2016  . CYST EXCISION     polynomial  . CYSTOSCOPY WITH INSERTION OF UROLIFT N/A 04/18/2018   Procedure: CYSTOSCOPY WITH INSERTION OF UROLIFT;  Surgeon: MCleon Gustin MD;  [facility]  Service: Urology;  Laterality: N/A;  . KNEE SURGERY Right 2000   arthroscopy  . MAZE N/A 10/16/2019   Procedure: MAZE with bilateral pulmonary vein isolation.;  Surgeon: GGrace Isaac MD;  [place] [facility]  Service: Open Heart Surgery;  Laterality: N/A;  Maze procedure using Atricure OLL2 Isolator clamp.  . REPLACEMENT ASCENDING AORTA N/A 10/16/2019   Procedure: SUPRA CORONARY REPLACEMENT OF ASCENDING AORTA using Maquet Hemashiel Platinum 34 MM Vascular Graft.;  Surgeon: GGrace Isaac MD;  [place] [facility]  Service: Open Heart Surgery;  Laterality: N/A;  Right subclavian artery cannulation.  .Marland KitchenRIGHT/LEFT HEART CATH AND CORONARY ANGIOGRAPHY N/A 01/30/2019   Procedure: RIGHT/LEFT HEART CATH AND CORONARY ANGIOGRAPHY;  Surgeon: JMartinique [name], MD;  [place] [facility];  Service: Cardiovascular;  Laterality: N/A;  . TEE WITHOUT CARDIOVERSION N/A 10/16/2019   Procedure: TRANSESOPHAGEAL ECHOCARDIOGRAM (TEE);  Surgeon: GGrace Isaac MD;  [place] [facility]  Service: Open Heart Surgery;  Laterality: N/A;  . TONSILLECTOMY    . widson teeth extraction         Family History  Problem Relation Age of Onset  . Aneurysm Father   . Heart disease Father   . Varicose Veins Mother   . Arthritis Mother   . Macular degeneration Mother   . Cancer Neg Hx   . Colon cancer Neg Hx   . Esophageal cancer Neg  Hx   . Rectal cancer Neg Hx   . Stomach cancer Neg Hx     Social History   Tobacco Use  . Smoking status: Former Smoker    Packs/day: 1.50    Years: 5.00    Pack years: 7.50    Types: Cigarettes    Quit date: 06/15/1994    Years since quitting: 26.2  . Smokeless tobacco: Never Used  Vaping Use  . Vaping Use: Never used  Substance Use Topics  . Alcohol use: Not Currently    Alcohol/week: 14.0 standard drinks    Types: 14 Shots of liquor per week    Comment: 1 drink of liquor per day  . Drug use: Yes    Types: Marijuana    Comment: occasionally marijuana    Home Medications Prior to Admission medications   Medication Sig Start Date End Date Taking? Authorizing Provider  alfuzosin (UROXATRAL) 10 MG 24 hr tablet Take 1 tablet (10 mg total) by mouth daily with breakfast. 09/08/20  Yes JJanith Lima MD  atorvastatin (LIPITOR) 20 MG tablet TAKE 1 TABLET AT BEDTIME 08/26/20  Yes JJanith Lima MD  Empagliflozin-metFORMIN HCl ER (SYNJARDY XR) 25-1000 MG TB24 Take  1 tablet by mouth daily.   Yes [provider]  losartan (COZAAR) 25 MG tablet TAKE 2 TABLETS EVERY DAY 05/21/20  Yes [name], [name], MD  metoprolol tartrate (LOPRESSOR) 25 MG tablet Take 0.5 tablets (12.5 mg total) by mouth 2 (two) times daily. 03/11/20  Yes [name], MD  rivaroxaban (XARELTO) 20 MG TABS tablet Take 1 tablet (20 mg total) by mouth daily with supper. 09/02/20  Yes [name], MD  spironolactone (ALDACTONE) 25 MG tablet Take 1 tablet by mouth 2 (two) times daily. 07/29/20  Yes [provider]  triazolam (HALCION) 0.25 MG tablet Take 1 tablet (0.25 mg total) by mouth at bedtime as needed. for sleep 09/02/20  Yes [name], MD    Allergies    Quinolones  Review of Systems   Review of Systems  Constitutional: Negative for chills and fever.  HENT: Negative for ear pain and sore throat.   Eyes: Negative for pain and visual disturbance.  Respiratory: Negative for cough and  shortness of breath.   Cardiovascular: Negative for chest pain and palpitations.  Gastrointestinal: Negative for abdominal pain and vomiting.  Genitourinary: Negative for dysuria and hematuria.  Musculoskeletal: Negative for arthralgias and back pain.  Skin: Negative for color change and rash.  Neurological: Positive for facial asymmetry. Negative for seizures.  All other systems reviewed and are negative.   Physical Exam Updated Vital Signs BP 127/73   Pulse 75   Temp 98.2 F (36.8 C)   Resp 12   Ht 6' 4"  (1.93 m)   Wt 114.3 kg   SpO2 99%   BMI 30.67 kg/m   Physical Exam Vitals and nursing note reviewed.  Constitutional:      Appearance: He is well-developed.  HENT:     Head: Normocephalic and atraumatic.  Eyes:     Extraocular Movements: Extraocular movements intact.     Pupils: Pupils are equal, round, and reactive to light.  Cardiovascular:     Rate and Rhythm: Normal rate and regular rhythm.  Pulmonary:     Effort: Pulmonary effort is normal.     Breath sounds: Normal breath sounds.  Abdominal:     Palpations: Abdomen is soft.     Tenderness: There is no abdominal tenderness.  Musculoskeletal:     Cervical back: Neck supple.  Skin:    General: Skin is warm and dry.  Neurological:     Mental Status: He is alert. He is confused.     GCS: GCS eye subscore is 4. GCS verbal subscore is 4. GCS motor subscore is 6.     Motor: Motor function is intact.     Coordination: Heel to Shin Test normal.     Comments: 5 out of 5 strength noted in the bilateral upper extremities and lower extremities.  Patient with sensation intact over the bilateral lower extremities and upper extremities. Mild facial asymmetry noted with the lower right face appearing weaker than the left only at rest.  Patient with smile intact.  Patient with puffing out cheeks intact.  Sensation intact over the face.  Able to close his eyes with good strength bilaterally.  Equal forehead eyebrow  raise. Heel-to-shin intact bilaterally.     ED Results / Procedures / Treatments   Labs (all labs ordered are listed, but only abnormal results are displayed) Labs Reviewed  PROTIME-INR - Abnormal; Notable for the following components:      Result Value   Prothrombin Time 16.6 (*)  INR 1.4 (*)    All other components within normal limits  COMPREHENSIVE METABOLIC PANEL - Abnormal; Notable for the following components:   CO2 18 (*)    Total Bilirubin 1.3 (*)    All other components within normal limits  URINALYSIS, ROUTINE W REFLEX MICROSCOPIC - Abnormal; Notable for the following components:   Glucose, UA >=500 (*)    Ketones, ur 80 (*)    All other components within normal limits  RAPID URINE DRUG SCREEN, HOSP PERFORMED - Abnormal; Notable for the following components:   Tetrahydrocannabinol POSITIVE (*)    All other components within normal limits  AMMONIA - Abnormal; Notable for the following components:   Ammonia <9 (*)    All other components within normal limits  VITAMIN B12 - Abnormal; Notable for the following components:   Vitamin B-12 85 (*)    All other components within normal limits  I-STAT CHEM 8, ED - Abnormal; Notable for the following components:   BUN 24 (*)    Calcium, Ion 1.12 (*)    TCO2 18 (*)    All other components within normal limits  I-STAT VENOUS BLOOD GAS, ED - Abnormal; Notable for the following components:   pCO2, Ven 31.8 (*)    pO2, Ven 73.0 (*)    Bicarbonate 19.5 (*)    TCO2 20 (*)    Acid-base deficit 4.0 (*)    All other components within normal limits  RESP PANEL BY RT-PCR (FLU A&B, COVID) ARPGX2  URINE CULTURE  APTT  CBC  DIFFERENTIAL  FOLATE  ETHANOL  TSH  TSH  VITAMIN B1  METHYLMALONIC ACID, SERUM  CBG MONITORING, ED  CBG MONITORING, ED    EKG None  Radiology MR BRAIN WO CONTRAST  Result Date: 09/15/2020 CLINICAL DATA:  Initial evaluation for neuro deficit, stroke suspected./ EXAM: MRI HEAD WITHOUT CONTRAST  TECHNIQUE: Multiplanar, multiecho pulse sequences of the brain and surrounding structures were obtained without intravenous contrast. COMPARISON:  Prior head CT from earlier the same day. FINDINGS: Brain: Examination degraded by motion artifact. Generalized age-related cerebral atrophy. Patchy T2/FLAIR hyperintensity within the periventricular and deep white matter both cerebral hemispheres most consistent with chronic small vessel ischemic disease, mild in nature. Small remote right cerebellar infarct noted. No abnormal foci of restricted diffusion to suggest acute or subacute ischemia. [place]ison-white matter differentiation maintained. No encephalomalacia to suggest chronic cortical infarction. No evidence for acute or chronic intracranial hemorrhage. No mass lesion, midline shift or mass effect. No hydrocephalus or extra-axial fluid collection. Pituitary gland suprasellar region normal. Midline structures intact. Vascular: Major intracranial vascular flow voids are maintained. Skull and upper cervical spine: Craniocervical junction within normal limits. Bone marrow signal intensity normal. No scalp soft tissue abnormality. Sinuses/Orbits: Globes and orbital soft tissues within normal limits. Mild scattered mucosal thickening noted within the ethmoidal air cells and maxillary sinuses. Paranasal sinuses are otherwise clear. No mastoid effusion. Inner ear structures grossly normal. Other: None. IMPRESSION: 1. No acute intracranial abnormality. 2. Generalized age-related cerebral atrophy with mild chronic small vessel ischemic disease, with small remote right cerebellar infarct. Electronically Signed   By: Jeannine Boga M.D.   On: 09/15/2020 22:30   CT HEAD CODE STROKE WO CONTRAST  Result Date: 09/15/2020 CLINICAL DATA:  Code stroke.  Neuro deficit, acute stroke suspected. EXAM: CT HEAD WITHOUT CONTRAST TECHNIQUE: Contiguous axial images were obtained from the base of the skull through the vertex without  intravenous contrast. COMPARISON:  CT head March 20 2020. FINDINGS: Brain: No  evidence of acute large vascular territory infarction, hemorrhage, hydrocephalus, extra-axial collection or mass lesion/mass effect. Small remote lacunar infarct in the right cerebellum. Vascular: No hyperdense vessel identified. Skull: No acute fracture. Sinuses/Orbits: Mild ethmoid air cell mucosal thickening. Mild mucosal thickening of the inferior maxillary sinuses. Unremarkable orbits. Other: No mastoid effusions. ASPECTS [facility] Stroke Program Early CT Score) Total score (0-10 with 10 being normal): 10. IMPRESSION: 1. No evidence of acute intracranial abnormality. 2. ASPECTS is 10. Code stroke imaging results were communicated on 09/15/2020 at 5:56 pm to provider Dr. Lorrin Goodell via secure text paging. Electronically Signed   By: Margaretha Sheffield MD   On: 09/15/2020 17:58    Procedures Procedures   Medications Ordered in ED Medications  thiamine (B-1) injection 100 mg ( Intravenous See Alternative 09/15/20 2050)    Or  thiamine tablet 100 mg (100 mg Oral Given 09/15/20 2050)  sodium chloride flush (NS) 0.9 % injection 3 mL (3 mLs Intravenous Given 09/15/20 1945)    ED Course  I have reviewed the triage vital signs and the nursing notes.  Pertinent labs & imaging results that were available during my care of the patient were reviewed by me and considered in my medical decision making (see chart for details).    MDM Rules/Calculators/A&P                          Patient is a 75 year old male with a history of A-fib on xarelto, HTN, DM who presents with concern for stroke.   Confusion and facial droop noted after 1600 (LKN).  On arrival patient met with ED physician (airway clear) and neurology for stroke evaluation. CT code stoke imaging ordered, patient taken immediately to scanner, results as above - no acute findings.   Patient with continuous confusion on my evaluation. Will order additional labs to add onto  code stroke labs. MRI brain ordered.  On frequent reevaluation, patient continued to be hemodynamically stable yet confused.  UDS positive for THC.  TSH within normal limits.  Vitamin B12 low.  No evidence for hypercarbia or acidosis.  Covid and flu negative.  No urinary tract infection.  CMP with only a bicarb of 18 and bilirubin of 1.3, otherwise no other abnormalities.  No leukocytosis and hemoglobin stable.  MRI without significant findings.  With continued confusion with acute onset, concern for encephalopathy of unknown etiology.  Patient to be admitted for further work-up and evaluation of his presenting symptoms.  Medicine consulted for admission.  Final Clinical Impression(s) / ED Diagnoses Final diagnoses:  Confusion  Encephalopathy    Rx / DC Orders ED Discharge Orders    None       , [name], MD 09/15/20 5615    Gareth Morgan, MD 09/16/20 1228

## 2020-09-15 NOTE — ED Notes (Signed)
Md schlossman notified pt passed swallow screen

## 2020-09-15 NOTE — H&P (Signed)
History and Physical    Clinton Black KNL:[id number] DOB: [birth year] DOA: 09/15/2020  PCP: [name], MD  Patient coming from: Home.  Chief Complaint: Confusion.  HPI: Clinton Black is a 75 y.o. male with history of persistent atrial fibrillation, diabetes mellitus type 2, hypertension status post aortic valve replacement and aortoplasty was found to be confused by patient's wife this afternoon.  As per the patient's friend of long-term states that they were watching basketball last night and patient was normal.  This morning they went for walking with the dog.  In the afternoon around 2 PM patient's wife called him and stated that patient was looking confused and had some right facial droop.  On arrival at the bedside patient was found to be confused and was talking about his old dogs which have died about 4 years ago.  Appeared not normal.  Was brought to the ER.  Patient admits to taking marijuana in the morning but states that he never gets confused.  ED Course: Initially patient was brought in as a code stroke.  CT head was unremarkable.  Neurologist on-call was consulted and felt that patient's right facial droop was on previous pills healthy.  Felt that patient unlikely had a stroke and if MRI is negative no stroke work-up but requested checking ammonia levels TSH and B12 levels.  MRI came back negative.  Patient's mental status improved.  Patient was afebrile.  B12 levels were found to be low at 85.  Patient admitted for further observation beetle started.  Covid test was negative.  Review of Systems: As per HPI, rest all negative.   Past Medical History:  Diagnosis Date  . Arthritis   . Atrial fibrillation ([place])   . BPH (benign prostatic hyperplasia)   . Diabetes mellitus type 2 in obese (HCC)    diet controlled  . HTN (hypertension)   . Hyperlipidemia, mixed   . Insomnia   . Obesity   . Thoracic aortic aneurysm, without rupture (HCC)    4.7 cm per chest ct with  contrast 11-04-17    Past Surgical History:  Procedure Laterality Date  . AORTIC VALVE REPLACEMENT N/A 10/16/2019   Procedure: AORTIC VALVE REPLACEMENT (AVR) using Edwards PERIMOUNT Magna Ease 25MM Bioprosthesis Aortic Valve.;  Surgeon: [name], MD;  Location: [facility];  Service: Open Heart Surgery;  Laterality: N/A;  Right subclavian artery cannulation.  . AORTIC VALVE REPLACEMENT  10/16/2019   AORTIC VALVE REPLACEMENT (AVR)   . ATRIAL FIBRILLATION ABLATION N/A 04/07/2019   Procedure: ATRIAL FIBRILLATION ABLATION;  Surgeon: [name], MD;  Location: [facility];  Service: Cardiovascular;  Laterality: N/A;  . CARDIOVERSION N/A 07/12/2018   Procedure: CARDIOVERSION;  Surgeon: Thayer Headings, MD;  Location: [facility] ENDOSCOPY;  Service: Cardiovascular;  Laterality: N/A;  . CARDIOVERSION N/A 08/22/2018   Procedure: CARDIOVERSION;  Surgeon: Skeet Latch, MD;  Location: [facility];  Service: Cardiovascular;  Laterality: N/A;  . CARDIOVERSION N/A 06/28/2019   Procedure: CARDIOVERSION;  Surgeon: Thayer Headings, MD;  Location: [place] [facility];  Service: Cardiovascular;  Laterality: N/A;  . CLIPPING OF ATRIAL APPENDAGE N/A 10/16/2019   Procedure: Clipping Of Atrial Appendage using AtriCure AtriClip Exclusion VLAA 72mm.;  Surgeon: [name], MD;  Location: [facility];  Service: Open Heart Surgery;  Laterality: N/A;  . COLONOSCOPY  2016  . CYST EXCISION     polynomial  . CYSTOSCOPY WITH INSERTION OF UROLIFT N/A 04/18/2018   Procedure: CYSTOSCOPY WITH INSERTION  OF UROLIFT;  Surgeon: Cleon Gustin, MD;  Location: [facility];  Service: Urology;  Laterality: N/A;  . KNEE SURGERY Right 2000   arthroscopy  . MAZE N/A 10/16/2019   Procedure: MAZE with bilateral pulmonary vein isolation.;  Surgeon: [name], MD;  Location: [facility];  Service: Open Heart Surgery;  Laterality: N/A;  Maze procedure using Atricure OLL2 Isolator clamp.  . REPLACEMENT ASCENDING  AORTA N/A 10/16/2019   Procedure: SUPRA CORONARY REPLACEMENT OF ASCENDING AORTA using Maquet Hemashiel Platinum 34 MM Vascular Graft.;  Surgeon: [name], MD;  Location: [facility];  Service: Open Heart Surgery;  Laterality: N/A;  Right subclavian artery cannulation.  Marland Kitchen RIGHT/LEFT HEART CATH AND CORONARY ANGIOGRAPHY N/A 01/30/2019   Procedure: RIGHT/LEFT HEART CATH AND CORONARY ANGIOGRAPHY;  Surgeon: [name], [name], MD;  Location: [facility];  Service: Cardiovascular;  Laterality: N/A;  . TEE WITHOUT CARDIOVERSION N/A 10/16/2019   Procedure: TRANSESOPHAGEAL ECHOCARDIOGRAM (TEE);  Surgeon: [name], MD;  Location: [facility];  Service: Open Heart Surgery;  Laterality: N/A;  . TONSILLECTOMY    . widson teeth extraction       reports that he quit smoking about 26 years ago. His smoking use included cigarettes. He has a 7.50 pack-year smoking history. He has never used smokeless tobacco. He reports previous alcohol use of about 14.0 standard drinks of alcohol per week. He reports current drug use. Drug: Marijuana.  Allergies  Allergen Reactions  . Quinolones Other (See Comments)    Patient was warned about not using Cipro and similar antibiotics. Recent studies have raised concern that fluoroquinolone antibiotics could be associated with an increased risk of aortic aneurysm Fluoroquinolones have non-antimicrobial properties that might jeopardise the integrity of the extracellular matrix of the vascular wall In a  propensity score matched cohort study in [place], there was a 66% increased rate of aortic aneurysm or dissection associated with oral fluoroquinolone use, compared wit    Family History  Problem Relation Age of Onset  . Aneurysm Father   . Heart disease Father   . Varicose Veins Mother   . Arthritis Mother   . Macular degeneration Mother   . Cancer Neg Hx   . Colon cancer Neg Hx   . Esophageal cancer Neg Hx   . Rectal cancer Neg Hx   . Stomach cancer Neg Hx      Prior to Admission medications   Medication Sig Start Date End Date Taking? Authorizing Provider  alfuzosin (UROXATRAL) 10 MG 24 hr tablet Take 1 tablet (10 mg total) by mouth daily with breakfast. 09/08/20  Yes [name], MD  atorvastatin (LIPITOR) 20 MG tablet TAKE 1 TABLET AT BEDTIME 08/26/20  Yes [name], MD  Empagliflozin-metFORMIN HCl ER (SYNJARDY XR) 25-1000 MG TB24 Take 1 tablet by mouth daily.   Yes [provider]  losartan (COZAAR) 25 MG tablet TAKE 2 TABLETS EVERY DAY 05/21/20  Yes [name], [name], MD  metoprolol tartrate (LOPRESSOR) 25 MG tablet Take 0.5 tablets (12.5 mg total) by mouth 2 (two) times daily. 03/11/20  Yes [name], MD  rivaroxaban (XARELTO) 20 MG TABS tablet Take 1 tablet (20 mg total) by mouth daily with supper. 09/02/20  Yes [name], MD  spironolactone (ALDACTONE) 25 MG tablet Take 1 tablet by mouth 2 (two) times daily. 07/29/20  Yes [provider]  triazolam (HALCION) 0.25 MG tablet Take 1 tablet (0.25 mg total) by mouth at bedtime as  needed. for sleep 09/02/20  Yes [name], MD    Physical Exam: Constitutional: Moderately built and nourished. Vitals:   09/15/20 2015 09/15/20 2045 09/15/20 2100 09/15/20 2115  BP: 119/77 118/79 119/60 127/73  Pulse: 67 74 73 75  Resp: 14 18 14 12   Temp:      SpO2: 97% 99% 98% 99%  Weight:      Height:       Eyes: Anicteric no pallor. ENMT: No discharge from the ears eyes nose or mouth. Neck: No mass felt.  No neck rigidity. Respiratory: No rhonchi or crepitations. Cardiovascular: S1-S2 heard. Abdomen: Soft nontender bowel sounds present. Musculoskeletal: No edema.  No rash. Skin: Alert awake oriented time place and person.  Moves all extremities. Neurologic: Alert awake oriented to time place and person.  Moves all extremities. Psychiatric: Appears normal.  Normal affect.   Labs on Admission: I have personally reviewed following labs and imaging  studies  CBC: Recent Labs  Lab 09/15/20 1740 09/15/20 1746 09/15/20 1955  WBC 9.2  --   --   NEUTROABS 6.6  --   --   HGB 14.2 14.3 13.3  HCT 44.1 42.0 39.0  MCV 89.5  --   --   PLT 180  --   --    Basic Metabolic Panel: Recent Labs  Lab 09/15/20 1740 09/15/20 1746 09/15/20 1955  NA 138 140 141  K 4.2 4.2 3.8  CL 110 109  --   CO2 18*  --   --   GLUCOSE 95 91  --   BUN 22 24*  --   CREATININE 0.97 0.90  --   CALCIUM 9.4  --   --    GFR: Estimated Creatinine Clearance: 101.1 mL/min (by C-G formula based on SCr of 0.9 mg/dL). Liver Function Tests: Recent Labs  Lab 09/15/20 1740  AST 17  ALT 15  ALKPHOS 83  BILITOT 1.3*  PROT 6.8  ALBUMIN 4.3   No results for input(s): LIPASE, AMYLASE in the last 168 hours. Recent Labs  Lab 09/15/20 2022  AMMONIA <9*   Coagulation Profile: Recent Labs  Lab 09/15/20 1740  INR 1.4*   Cardiac Enzymes: No results for input(s): CKTOTAL, CKMB, CKMBINDEX, TROPONINI in the last 168 hours. BNP (last 3 results) No results for input(s): PROBNP in the last 8760 hours. HbA1C: No results for input(s): HGBA1C in the last 72 hours. CBG: Recent Labs  Lab 09/15/20 1738 09/15/20 1939  GLUCAP 89 86   Lipid Profile: No results for input(s): CHOL, HDL, LDLCALC, TRIG, CHOLHDL, LDLDIRECT in the last 72 hours. Thyroid Function Tests: Recent Labs    09/15/20 2022  TSH 1.826   Anemia Panel: Recent Labs    09/15/20 2022 09/15/20 2101  VITAMINB12 85*  --   FOLATE  --  8.2   Urine analysis:    Component Value Date/Time   COLORURINE YELLOW 09/15/2020 2059   APPEARANCEUR CLEAR 09/15/2020 2059   LABSPEC 1.027 09/15/2020 2059   PHURINE 5.0 09/15/2020 2059   GLUCOSEU >=500 (A) 09/15/2020 2059   GLUCOSEU >=1000 (A) 09/02/2020 1254   HGBUR NEGATIVE 09/15/2020 2059   BILIRUBINUR NEGATIVE 09/15/2020 2059   BILIRUBINUR neg 02/07/2015 1526   KETONESUR 80 (A) 09/15/2020 2059   PROTEINUR NEGATIVE 09/15/2020 2059   UROBILINOGEN 0.2  09/02/2020 1254   NITRITE NEGATIVE 09/15/2020 2059   LEUKOCYTESUR NEGATIVE 09/15/2020 2059   Sepsis Labs: @LABRCNTIP (procalcitonin:4,lacticidven:4) ) Recent Results (from the past 240 hour(s))  Resp Panel by RT-PCR (Flu  A&B, Covid) Nasopharyngeal Swab     Status: None   Collection Time: 09/15/20  6:26 PM   Specimen: Nasopharyngeal Swab; Nasopharyngeal(NP) swabs in vial transport medium  Result Value Ref Range Status   SARS Coronavirus 2 by RT PCR NEGATIVE NEGATIVE Final    Comment: (NOTE) SARS-CoV-2 target nucleic acids are NOT DETECTED.  The SARS-CoV-2 RNA is generally detectable in upper respiratory specimens during the acute phase of infection. The lowest concentration of SARS-CoV-2 viral copies this assay can detect is 138 copies/mL. A negative result does not preclude SARS-Cov-2 infection and should not be used as the sole basis for treatment or other patient management decisions. A negative result may occur with  improper specimen collection/handling, submission of specimen other than nasopharyngeal swab, presence of viral mutation(s) within the areas targeted by this assay, and inadequate number of viral copies(<138 copies/mL). A negative result must be combined with clinical observations, patient history, and epidemiological information. The expected result is Negative.  Fact Sheet for Patients:  EntrepreneurPulse.com.au  Fact Sheet for Healthcare Providers:  IncredibleEmployment.be  This test is no t yet approved or cleared by the [place] FDA and  has been authorized for detection and/or diagnosis of SARS-CoV-2 by FDA under an Emergency Use Authorization (EUA). This EUA will remain  in effect (meaning this test can be used) for the duration of the COVID-19 declaration under Section 564(b)(1) of the Act, 21 U.S.C.section 360bbb-3(b)(1), unless the authorization is terminated  or revoked sooner.       Influenza A by PCR  NEGATIVE NEGATIVE Final   Influenza B by PCR NEGATIVE NEGATIVE Final    Comment: (NOTE) The Xpert Xpress SARS-CoV-2/FLU/RSV plus assay is intended as an aid in the diagnosis of influenza from Nasopharyngeal swab specimens and should not be used as a sole basis for treatment. Nasal washings and aspirates are unacceptable for Xpert Xpress SARS-CoV-2/FLU/RSV testing.  Fact Sheet for Patients: EntrepreneurPulse.com.au  Fact Sheet for Healthcare Providers: IncredibleEmployment.be  This test is not yet approved or cleared by the [place] FDA and has been authorized for detection and/or diagnosis of SARS-CoV-2 by FDA under an Emergency Use Authorization (EUA). This EUA will remain in effect (meaning this test can be used) for the duration of the COVID-19 declaration under Section 564(b)(1) of the Act, 21 U.S.C. section 360bbb-3(b)(1), unless the authorization is terminated or revoked.  Performed at [facility], [place] [address], [place], [place] [address]      Radiological Exams on Admission: MR BRAIN WO CONTRAST  Result Date: 09/15/2020 CLINICAL DATA:  Initial evaluation for neuro deficit, stroke suspected./ EXAM: MRI HEAD WITHOUT CONTRAST TECHNIQUE: Multiplanar, multiecho pulse sequences of the brain and surrounding structures were obtained without intravenous contrast. COMPARISON:  Prior head CT from earlier the same day. FINDINGS: Brain: Examination degraded by motion artifact. Generalized age-related cerebral atrophy. Patchy T2/FLAIR hyperintensity within the periventricular and deep white matter both cerebral hemispheres most consistent with chronic small vessel ischemic disease, mild in nature. Small remote right cerebellar infarct noted. No abnormal foci of restricted diffusion to suggest acute or subacute ischemia. Lebeck-white matter differentiation maintained. No encephalomalacia to suggest chronic cortical infarction. No evidence for acute  or chronic intracranial hemorrhage. No mass lesion, midline shift or mass effect. No hydrocephalus or extra-axial fluid collection. Pituitary gland suprasellar region normal. Midline structures intact. Vascular: Major intracranial vascular flow voids are maintained. Skull and upper cervical spine: Craniocervical junction within normal limits. Bone marrow signal intensity normal. No scalp soft tissue abnormality.  Sinuses/Orbits: Globes and orbital soft tissues within normal limits. Mild scattered mucosal thickening noted within the ethmoidal air cells and maxillary sinuses. Paranasal sinuses are otherwise clear. No mastoid effusion. Inner ear structures grossly normal. Other: None. IMPRESSION: 1. No acute intracranial abnormality. 2. Generalized age-related cerebral atrophy with mild chronic small vessel ischemic disease, with small remote right cerebellar infarct. Electronically Signed   By: Jeannine Boga M.D.   On: 09/15/2020 22:30   CT HEAD CODE STROKE WO CONTRAST  Result Date: 09/15/2020 CLINICAL DATA:  Code stroke.  Neuro deficit, acute stroke suspected. EXAM: CT HEAD WITHOUT CONTRAST TECHNIQUE: Contiguous axial images were obtained from the base of the skull through the vertex without intravenous contrast. COMPARISON:  CT head March 20 2020. FINDINGS: Brain: No evidence of acute large vascular territory infarction, hemorrhage, hydrocephalus, extra-axial collection or mass lesion/mass effect. Small remote lacunar infarct in the right cerebellum. Vascular: No hyperdense vessel identified. Skull: No acute fracture. Sinuses/Orbits: Mild ethmoid air cell mucosal thickening. Mild mucosal thickening of the inferior maxillary sinuses. Unremarkable orbits. Other: No mastoid effusions. ASPECTS [facility] Stroke Program Early CT Score) Total score (0-10 with 10 being normal): 10. IMPRESSION: 1. No evidence of acute intracranial abnormality. 2. ASPECTS is 10. Code stroke imaging results were communicated on  09/15/2020 at 5:56 pm to provider Dr. Lorrin Goodell via secure text paging. Electronically Signed   By: Margaretha Sheffield MD   On: 09/15/2020 17:58    EKG: Independently reviewed.  Normal sinus rhythm.  Assessment/Plan Principal Problem:   Acute encephalopathy Active Problems:   Chronic diastolic CHF (congestive heart failure), NYHA class 2 (HCC)   Persistent atrial fibrillation (HCC)   Thoracic aortic aneurysm ([place])    1. Acute encephalopathy in setting of B12 deficiency for which patient is restarted on B12 injection after discussing with pharmacy.  MRI brain was negative for stroke.  TSH and ammonia levels are unremarkable.  Patient was empirically started on thiamine due to previous history of alcohol abuse. 2. Persistent A. fib on Xarelto and metoprolol. 3. Hypertension on metoprolol and Cozaar. 4. Diabetes mellitus type 2 we will keep patient on sliding scale coverage. 5. History of diastolic CHF appears compensated.  On spironolactone. 6. History of thoracic aortic aneurysm denies any chest pain. 7. Status post aortic valve replacement and aortoplasty. 8. Marijuana abuse -states he takes marijuana about a mL every month but does not get confused.   DVT prophylaxis: Xarelto. Code Status: Full code. Family Communication: Discussed with patient. Disposition Plan: Home. Consults called: Neurology. Admission status: Observation.   Rise Patience MD [facility] Pager [phone].  If 7PM-7AM, please contact night-coverage [email or web address] Password [facility]  09/15/2020, 11:16 PM

## 2020-09-15 NOTE — ED Triage Notes (Signed)
Wife noticed at 50 that pt had slight right side facial droop and slight confusion.  Pt currently a/o to all except month, NIHSS of 2

## 2020-09-16 ENCOUNTER — Encounter (HOSPITAL_COMMUNITY): Payer: Self-pay | Admitting: Internal Medicine

## 2020-09-16 DIAGNOSIS — I5032 Chronic diastolic (congestive) heart failure: Secondary | ICD-10-CM | POA: Diagnosis not present

## 2020-09-16 DIAGNOSIS — G9340 Encephalopathy, unspecified: Secondary | ICD-10-CM

## 2020-09-16 LAB — RPR: RPR Ser Ql: NONREACTIVE

## 2020-09-16 MED ORDER — THIAMINE HCL 100 MG PO TABS: 100.0000 mg | ORAL_TABLET | Freq: Every day | ORAL | 1 refills | Status: DC

## 2020-09-16 MED ORDER — VITAMIN B-12 1000 MCG PO TABS: 1000.0000 ug | ORAL_TABLET | Freq: Every day | ORAL | 1 refills | Status: AC

## 2020-09-16 NOTE — Discharge Summary (Signed)
Physician Discharge Summary  Clinton Black HAL:[id number] DOB: 1945/07/28 DOA: 09/15/2020  PCP: [name], MD  Admit date: 09/15/2020   Discharge date: 09/16/2020  Admitted From: Home.  Disposition: Home.  Recommendations for Outpatient Follow-up:  1. Follow up with PCP in 1-2 weeks 2. Please obtain BMP/CBC in one week 3. Advised to follow-up with primary care physician in 1 week.   4. Advised to follow-up with neurologist in 3 to 4 weeks.   5. Advised to take vitamin B12 1000 mcg daily. 6. Advised to take thiamine 100 mg p.o. daily.   7. Recheck vitamin B12 levels in 4 to 6 weeks.   Home Health:None Equipment/Devices:None  Discharge Condition: Stable CODE STATUS:Full code Diet recommendation: Heart Healthy   Brief summary : This 75 years old male with PMH significant for persistent atrial fibrillation, DM type II, hypertension, status post aortic valve replacement and arterioplasty was brought in the ED for confusion.  Patient was noted to be confused in the afternoon.  Patient was watching basketball game last night with his friends and patient was normal. Patient went for walking in the morning with the dog,  by 2 PM after lunch,  Patient's wife noted patient to be confused and had some right-sided facial droop.  Wife reported patient was talking about his old dogs which have died 4 years ago which is unusual.  Patient was brought in the ED.  Patient also admits to taking marijuana in the morning but he states he never gets confused after taking marijuana.  Hospital Course: Patient was initially brought in as stroke code,  CT head was unremarkable.  Neurologist on-call was consulted and felt the patient's right-sided facial droop is previous Bell's palsy.  Patient unlikely had a stroke and if MRI is negative,  no stroke work-up is needed.  TSH ammonia levels were normal.  B12 found to be 85,  very low.  MRI came back negative.  Patient's mental status has significantly improved.  Patient has ambulated in the hallway without any weakness.  Patient feels much improved and wants to be discharged.  Patient was seen by neurologist,  recommended outpatient neurology follow-up.  Patient should continue vitamin B12 1000 mcg daily and vitamin B12 level be checked within 4 weeks.   He was managed for below problems.  Discharge Diagnoses:  Principal Problem:   Acute encephalopathy Active Problems:   Chronic diastolic CHF (congestive heart failure), NYHA class 2 (HCC)   Persistent atrial fibrillation (HCC)   Thoracic aortic aneurysm (HCC)  1. Acute encephalopathy in setting of B12 deficiency for which patient was restarted on B12 injection after discussing with pharmacy.  MRI brain was negative for stroke.  TSH and ammonia levels are unremarkable.  Patient was empirically started on thiamine due to previous history of alcohol abuse. 2. Persistent A. fib : Continue  Xarelto and metoprolol. 3. Hypertension : Continue  metoprolol and Cozaar. 4. Diabetes mellitus type 2 we will keep patient on sliding scale coverage. 5. History of diastolic CHF appears compensated.  On spironolactone. 6. History of thoracic aortic aneurysm,  denies any chest pain. 7. Status post aortic valve replacement and aortoplasty. 8. Marijuana abuse -states he takes marijuana about a mL every month but does not get confused.   Discharge Instructions  Discharge Instructions    Call MD for:  difficulty breathing, headache or visual disturbances   Complete by: As directed    Call MD for:  persistant dizziness or light-headedness   Complete by: As  directed    Call MD for:  persistant nausea and vomiting   Complete by: As directed    Diet - low sodium heart healthy   Complete by: As directed    Diet Carb Modified   Complete by: As directed    Discharge instructions   Complete by: As directed    Advised to follow-up with primary care physician in 1 week.   Advised to follow-up with neurologist in 3 to  4 weeks.  Advised to take vitamin B12 1000 mcg daily. Advised to take thiamine 100 mg p.o. daily.  Recheck vitamin B12 levels in 4 to 6 weeks.   Increase activity slowly   Complete by: As directed      Allergies as of 09/16/2020      Reactions   Quinolones Other (See Comments)   Patient was warned about not using Cipro and similar antibiotics. Recent studies have raised concern that fluoroquinolone antibiotics could be associated with an increased risk of aortic aneurysm Fluoroquinolones have non-antimicrobial properties that might jeopardise the integrity of the extracellular matrix of the vascular wall In a  propensity score matched cohort study in [place], there was a 66% increased rate of aortic aneurysm or dissection associated with oral fluoroquinolone use, compared wit      Medication List    TAKE these medications   alfuzosin 10 MG 24 hr tablet Commonly known as: UROXATRAL Take 1 tablet (10 mg total) by mouth daily with breakfast.   atorvastatin 20 MG tablet Commonly known as: LIPITOR TAKE 1 TABLET AT BEDTIME   losartan 25 MG tablet Commonly known as: COZAAR TAKE 2 TABLETS EVERY DAY   metoprolol tartrate 25 MG tablet Commonly known as: LOPRESSOR Take 0.5 tablets (12.5 mg total) by mouth 2 (two) times daily.   rivaroxaban 20 MG Tabs tablet Commonly known as: Xarelto Take 1 tablet (20 mg total) by mouth daily with supper.   spironolactone 25 MG tablet Commonly known as: ALDACTONE Take 1 tablet by mouth 2 (two) times daily.   Synjardy XR 25-1000 MG Tb24 Generic drug: Empagliflozin-metFORMIN HCl ER Take 1 tablet by mouth daily.   thiamine 100 MG tablet Take 1 tablet (100 mg total) by mouth daily. Start taking on: September 17, 2020   triazolam 0.25 MG tablet Commonly known as: HALCION Take 1 tablet (0.25 mg total) by mouth at bedtime as needed. for sleep   vitamin B-12 1000 MCG tablet Commonly known as: CYANOCOBALAMIN Take 1 tablet (1,000 mcg total) by mouth  daily.       Follow-up Information    [name], MD Follow up in 1 week(s).   Specialty: Internal Medicine Contact information: [place] [address] [phone]        [name], [name], MD .   Specialty: Cardiology Contact information: [place] [address] [phone]        [name], MD .   Specialty: Cardiology Contact information: [address] [place] [address] [phone]        [facility] Follow up.   Contact information: [address]     [address] [place] [address] [phone]             Allergies  Allergen Reactions  . Quinolones Other (See Comments)    Patient was warned about not using Cipro and similar antibiotics. Recent studies have raised concern that fluoroquinolone antibiotics could be associated with an increased risk  of aortic aneurysm Fluoroquinolones have non-antimicrobial properties that might jeopardise the integrity of the extracellular matrix of the vascular wall In a  propensity score matched cohort study in [place], there was a 66% increased rate of aortic aneurysm or dissection associated with oral fluoroquinolone use, compared wit    Consultations:  Neurology   Procedures/Studies: MR BRAIN WO CONTRAST  Result Date: 09/15/2020 CLINICAL DATA:  Initial evaluation for neuro deficit, stroke suspected./ EXAM: MRI HEAD WITHOUT CONTRAST TECHNIQUE: Multiplanar, multiecho pulse sequences of the brain and surrounding structures were obtained without intravenous contrast. COMPARISON:  Prior head CT from earlier the same day. FINDINGS: Brain: Examination degraded by motion artifact. Generalized age-related cerebral atrophy. Patchy T2/FLAIR hyperintensity within the periventricular and deep white matter both cerebral hemispheres most consistent with chronic small vessel ischemic disease, mild in nature. Small remote  right cerebellar infarct noted. No abnormal foci of restricted diffusion to suggest acute or subacute ischemia. Wonders-white matter differentiation maintained. No encephalomalacia to suggest chronic cortical infarction. No evidence for acute or chronic intracranial hemorrhage. No mass lesion, midline shift or mass effect. No hydrocephalus or extra-axial fluid collection. Pituitary gland suprasellar region normal. Midline structures intact. Vascular: Major intracranial vascular flow voids are maintained. Skull and upper cervical spine: Craniocervical junction within normal limits. Bone marrow signal intensity normal. No scalp soft tissue abnormality. Sinuses/Orbits: Globes and orbital soft tissues within normal limits. Mild scattered mucosal thickening noted within the ethmoidal air cells and maxillary sinuses. Paranasal sinuses are otherwise clear. No mastoid effusion. Inner ear structures grossly normal. Other: None. IMPRESSION: 1. No acute intracranial abnormality. 2. Generalized age-related cerebral atrophy with mild chronic small vessel ischemic disease, with small remote right cerebellar infarct. Electronically Signed   By: Jeannine Boga M.D.   On: 09/15/2020 22:30   XR KNEE 3 VIEW RIGHT  Result Date: 08/26/2020 Standing AP both knees lateral right knee sunrise patella x-ray obtained and reviewed.  This shows right knee osteoarthritis medial compartment bone-on-bone 10 degrees varus deformity with marginal osteophyte subchondral sclerosis.  Mild changes left knee. Impression: Moderate to advanced right knee osteoarthritis with varus.  CT HEAD CODE STROKE WO CONTRAST  Result Date: 09/15/2020 CLINICAL DATA:  Code stroke.  Neuro deficit, acute stroke suspected. EXAM: CT HEAD WITHOUT CONTRAST TECHNIQUE: Contiguous axial images were obtained from the base of the skull through the vertex without intravenous contrast. COMPARISON:  CT head March 20 2020. FINDINGS: Brain: No evidence of acute large vascular  territory infarction, hemorrhage, hydrocephalus, extra-axial collection or mass lesion/mass effect. Small remote lacunar infarct in the right cerebellum. Vascular: No hyperdense vessel identified. Skull: No acute fracture. Sinuses/Orbits: Mild ethmoid air cell mucosal thickening. Mild mucosal thickening of the inferior maxillary sinuses. Unremarkable orbits. Other: No mastoid effusions. ASPECTS [facility] Stroke Program Early CT Score) Total score (0-10 with 10 being normal): 10. IMPRESSION: 1. No evidence of acute intracranial abnormality. 2. ASPECTS is 10. Code stroke imaging results were communicated on 09/15/2020 at 5:56 pm to provider Dr. Lorrin Goodell via secure text paging. Electronically Signed   By: Margaretha Sheffield MD   On: 09/15/2020 17:58    MRI CT head.   Subjective: Patient was seen and examined at bedside.  Overnight events noted.  Patient reports feeling much better, Patient wants to be discharged.  Discharge Exam: Vitals:   09/16/20 0903 09/16/20 1119  BP: (!) 105/57 118/80  Pulse: 64 71  Resp: 18 18  Temp: 98 F (36.7 C) 97.9 F (36.6 C)  SpO2: 96% 98%   Vitals:  09/16/20 0600 09/16/20 0800 09/16/20 0903 09/16/20 1119  BP: 129/77 (!) 150/91 (!) 105/57 118/80  Pulse: 61 70 64 71  Resp: 14 19 18 18   Temp: 97.8 F (36.6 C) 98.1 F (36.7 C) 98 F (36.7 C) 97.9 F (36.6 C)  TempSrc: Oral Oral Oral Oral  SpO2: 94% 99% 96% 98%  Weight:      Height:        General: Pt is alert, awake, not in acute distress Cardiovascular: RRR, S1/S2 +, no rubs, no gallops Respiratory: CTA bilaterally, no wheezing, no rhonchi Abdominal: Soft, NT, ND, bowel sounds + Extremities: no edema, no cyanosis    The results of significant diagnostics from this hospitalization (including imaging, microbiology, ancillary and laboratory) are listed below for reference.     Microbiology: Recent Results (from the past 240 hour(s))  Resp Panel by RT-PCR (Flu A&B, Covid) Nasopharyngeal Swab      Status: None   Collection Time: 09/15/20  6:26 PM   Specimen: Nasopharyngeal Swab; Nasopharyngeal(NP) swabs in vial transport medium  Result Value Ref Range Status   SARS Coronavirus 2 by RT PCR NEGATIVE NEGATIVE Final    Comment: (NOTE) SARS-CoV-2 target nucleic acids are NOT DETECTED.  The SARS-CoV-2 RNA is generally detectable in upper respiratory specimens during the acute phase of infection. The lowest concentration of SARS-CoV-2 viral copies this assay can detect is 138 copies/mL. A negative result does not preclude SARS-Cov-2 infection and should not be used as the sole basis for treatment or other patient management decisions. A negative result may occur with  improper specimen collection/handling, submission of specimen other than nasopharyngeal swab, presence of viral mutation(s) within the areas targeted by this assay, and inadequate number of viral copies(<138 copies/mL). A negative result must be combined with clinical observations, patient history, and epidemiological information. The expected result is Negative.  Fact Sheet for Patients:  EntrepreneurPulse.com.au  Fact Sheet for Healthcare Providers:  IncredibleEmployment.be  This test is no t yet approved or cleared by the [place] FDA and  has been authorized for detection and/or diagnosis of SARS-CoV-2 by FDA under an Emergency Use Authorization (EUA). This EUA will remain  in effect (meaning this test can be used) for the duration of the COVID-19 declaration under Section 564(b)(1) of the Act, 21 U.S.C.section 360bbb-3(b)(1), unless the authorization is terminated  or revoked sooner.       Influenza A by PCR NEGATIVE NEGATIVE Final   Influenza B by PCR NEGATIVE NEGATIVE Final    Comment: (NOTE) The Xpert Xpress SARS-CoV-2/FLU/RSV plus assay is intended as an aid in the diagnosis of influenza from Nasopharyngeal swab specimens and should not be used as a sole basis  for treatment. Nasal washings and aspirates are unacceptable for Xpert Xpress SARS-CoV-2/FLU/RSV testing.  Fact Sheet for Patients: EntrepreneurPulse.com.au  Fact Sheet for Healthcare Providers: IncredibleEmployment.be  This test is not yet approved or cleared by the [place] FDA and has been authorized for detection and/or diagnosis of SARS-CoV-2 by FDA under an Emergency Use Authorization (EUA). This EUA will remain in effect (meaning this test can be used) for the duration of the COVID-19 declaration under Section 564(b)(1) of the Act, 21 U.S.C. section 360bbb-3(b)(1), unless the authorization is terminated or revoked.  Performed at [facility], [place] [address] [place], [place] [address]      Labs: BNP (last 3 results) No results for input(s): BNP in the last 8760 hours. Basic Metabolic Panel: Recent Labs  Lab 09/15/20 1740 09/15/20 1746  09/15/20 1955  NA 138 140 141  K 4.2 4.2 3.8  CL 110 109  --   CO2 18*  --   --   GLUCOSE 95 91  --   BUN 22 24*  --   CREATININE 0.97 0.90  --   CALCIUM 9.4  --   --    Liver Function Tests: Recent Labs  Lab 09/15/20 1740  AST 17  ALT 15  ALKPHOS 83  BILITOT 1.3*  PROT 6.8  ALBUMIN 4.3   No results for input(s): LIPASE, AMYLASE in the last 168 hours. Recent Labs  Lab 09/15/20 2022  AMMONIA <9*   CBC: Recent Labs  Lab 09/15/20 1740 09/15/20 1746 09/15/20 1955  WBC 9.2  --   --   NEUTROABS 6.6  --   --   HGB 14.2 14.3 13.3  HCT 44.1 42.0 39.0  MCV 89.5  --   --   PLT 180  --   --    Cardiac Enzymes: No results for input(s): CKTOTAL, CKMB, CKMBINDEX, TROPONINI in the last 168 hours. BNP: Invalid input(s): POCBNP CBG: Recent Labs  Lab 09/15/20 1738 09/15/20 1939  GLUCAP 89 86   D-Dimer No results for input(s): DDIMER in the last 72 hours. Hgb A1c No results for input(s): HGBA1C in the last 72 hours. Lipid Profile No results for input(s): CHOL, HDL,  LDLCALC, TRIG, CHOLHDL, LDLDIRECT in the last 72 hours. Thyroid function studies Recent Labs    09/15/20 2022  TSH 1.826   Anemia work up Recent Labs    09/15/20 2022 09/15/20 2101  VITAMINB12 85*  --   FOLATE  --  8.2   Urinalysis    Component Value Date/Time   COLORURINE YELLOW 09/15/2020 2059   APPEARANCEUR CLEAR 09/15/2020 2059   LABSPEC 1.027 09/15/2020 2059   PHURINE 5.0 09/15/2020 2059   GLUCOSEU >=500 (A) 09/15/2020 2059   GLUCOSEU >=1000 (A) 09/02/2020 1254   HGBUR NEGATIVE 09/15/2020 2059   BILIRUBINUR NEGATIVE 09/15/2020 2059   BILIRUBINUR neg 02/07/2015 1526   KETONESUR 80 (A) 09/15/2020 2059   PROTEINUR NEGATIVE 09/15/2020 2059   UROBILINOGEN 0.2 09/02/2020 1254   NITRITE NEGATIVE 09/15/2020 2059   LEUKOCYTESUR NEGATIVE 09/15/2020 2059   Sepsis Labs Invalid input(s): PROCALCITONIN,  WBC,  LACTICIDVEN Microbiology Recent Results (from the past 240 hour(s))  Resp Panel by RT-PCR (Flu A&B, Covid) Nasopharyngeal Swab     Status: None   Collection Time: 09/15/20  6:26 PM   Specimen: Nasopharyngeal Swab; Nasopharyngeal(NP) swabs in vial transport medium  Result Value Ref Range Status   SARS Coronavirus 2 by RT PCR NEGATIVE NEGATIVE Final    Comment: (NOTE) SARS-CoV-2 target nucleic acids are NOT DETECTED.  The SARS-CoV-2 RNA is generally detectable in upper respiratory specimens during the acute phase of infection. The lowest concentration of SARS-CoV-2 viral copies this assay can detect is 138 copies/mL. A negative result does not preclude SARS-Cov-2 infection and should not be used as the sole basis for treatment or other patient management decisions. A negative result may occur with  improper specimen collection/handling, submission of specimen other than nasopharyngeal swab, presence of viral mutation(s) within the areas targeted by this assay, and inadequate number of viral copies(<138 copies/mL). A negative result must be combined with clinical  observations, patient history, and epidemiological information. The expected result is Negative.  Fact Sheet for Patients:  EntrepreneurPulse.com.au  Fact Sheet for Healthcare Providers:  IncredibleEmployment.be  This test is no t yet approved or cleared by the  [place] Architectural technologist and  has been authorized for detection and/or diagnosis of SARS-CoV-2 by FDA under an Print production planner (EUA). This EUA will remain  in effect (meaning this test can be used) for the duration of the COVID-19 declaration under Section 564(b)(1) of the Act, 21 U.S.C.section 360bbb-3(b)(1), unless the authorization is terminated  or revoked sooner.       Influenza A by PCR NEGATIVE NEGATIVE Final   Influenza B by PCR NEGATIVE NEGATIVE Final    Comment: (NOTE) The Xpert Xpress SARS-CoV-2/FLU/RSV plus assay is intended as an aid in the diagnosis of influenza from Nasopharyngeal swab specimens and should not be used as a sole basis for treatment. Nasal washings and aspirates are unacceptable for Xpert Xpress SARS-CoV-2/FLU/RSV testing.  Fact Sheet for Patients: EntrepreneurPulse.com.au  Fact Sheet for Healthcare Providers: IncredibleEmployment.be  This test is not yet approved or cleared by the [place] FDA and has been authorized for detection and/or diagnosis of SARS-CoV-2 by FDA under an Emergency Use Authorization (EUA). This EUA will remain in effect (meaning this test can be used) for the duration of the COVID-19 declaration under Section 564(b)(1) of the Act, 21 U.S.C. section 360bbb-3(b)(1), unless the authorization is terminated or revoked.  Performed at [facility], [place] [address] [place], [place] [address]      Time coordinating discharge: Over 30 minutes  SIGNED:   Shawna Clamp, MD  [facility] 09/16/2020, 3:11 PM Pager   If 7PM-7AM, please contact night-coverage [email or web address]

## 2020-09-16 NOTE — ED Notes (Signed)
Pt resting in bed with eyes closed. Respirations are even and non-labored  

## 2020-09-16 NOTE — Discharge Instructions (Signed)
Advised to follow-up with primary care physician in 1 week.   Advised to follow-up with neurologist in 3 to 4 weeks.  Advised to take vitamin B12 1000 mcg daily. Advised to take thiamine 100 mg p.o. daily.  Recheck vitamin B12 levels in 4 to 6 weeks.

## 2020-09-16 NOTE — Progress Notes (Signed)
Neurology Progress Note Subjective: No acute overnight events. Patient does complain of not sleeping well overnight but feels that his mental status is "very clear" today. States that he does not fully remember events leading up to his hospitalization yesterday but states he "vaguely remembers" being upset about his dogs leading to his confusion. Also, he states that he had a bad dream about his dogs leading to him calling for dogs he had in the past. At other times, he states that he just rescued a new dog from the shelter but has been without dogs for 2 years and having a new animal has made him think about his other dogs. Patient endorses smoking marijuana yesterday. States he smokes marijuana daily and smokes one gram per day and one ounce per month and has been smoking marijuana for years.  He does endorse dizziness with standing and feeling like he has to hold onto objects for balance with standing recently. He also endorses losing 60 pounds over one year intentionally by watching what he eats.   Exam: Vitals:   09/16/20 0800 09/16/20 0903  BP: (!) 150/91 (!) 105/57  Pulse: 70 64  Resp: 19 18  Temp: 98.1 F (36.7 C) 98 F (36.7 C)  SpO2: 99% 96%   Gen: Laying in ER stretcher, in no acute distress Resp: non-labored breathing, no respiratory distress Abd: soft, non-distended, non-tender  Neuro: MS: Alert and oriented to self, age, month, year, and somewhat to situation. He states he vaguely remembers feeling confused but attributes this to various causes including recent hospitalization, bad dreams, and a new adopted dog. Speech is intact without aphasia or dysarthria. Naming, comprehension, and fluency are intact. No neglect is noted.  CN: Pupils equal, round, and reactive to light bilaterally, 4 --> 3 mm / brisk, visual fields full, EOMI without ptosis, face sensation to light touch intact and symmetric bilaterally, subtle right-sided mouth droop noted (history of Bell's Palsy), hearing  is intact to voice, phonation normal, symmetric palate rise with phonation, shoulder shrug intact and symmetric, tongue protrudes midline without fasciculations.  Motor: 5/5 strength in bilateral upper and lower extremities. Follows commands. Tone and bulk are normal.  Sensory: Sensation to light touch intact and symmetric bilateral upper and lower extremities. DTR: 2+ and symmetric throughout  Pertinent Labs: CBC    Component Value Date/Time   WBC 9.2 09/15/2020 1740   RBC 4.93 09/15/2020 1740   HGB 13.3 09/15/2020 1955   HGB 13.3 11/10/2019 1541   HCT 39.0 09/15/2020 1955   HCT 41.3 11/10/2019 1541   PLT 180 09/15/2020 1740   PLT 253 11/10/2019 1541   MCV 89.5 09/15/2020 1740   MCV 90 11/10/2019 1541   MCH 28.8 09/15/2020 1740   MCHC 32.2 09/15/2020 1740   RDW 13.0 09/15/2020 1740   RDW 13.2 11/10/2019 1541   LYMPHSABS 1.6 09/15/2020 1740   LYMPHSABS 2.4 01/25/2019 1358   MONOABS 0.5 09/15/2020 1740   EOSABS 0.3 09/15/2020 1740   EOSABS 0.4 01/25/2019 1358   BASOSABS 0.1 09/15/2020 1740   BASOSABS 0.1 01/25/2019 1358   CMP     Component Value Date/Time   NA 141 09/15/2020 1955   NA 139 11/10/2019 1541   K 3.8 09/15/2020 1955   CL 109 09/15/2020 1746   CO2 18 (L) 09/15/2020 1740   GLUCOSE 91 09/15/2020 1746   BUN 24 (H) 09/15/2020 1746   BUN 36 (H) 11/10/2019 1541   CREATININE 0.90 09/15/2020 1746   CALCIUM 9.4 09/15/2020 1740  PROT 6.8 09/15/2020 1740   PROT 6.5 03/11/2017 1541   ALBUMIN 4.3 09/15/2020 1740   ALBUMIN 4.5 03/11/2017 1541   AST 17 09/15/2020 1740   ALT 15 09/15/2020 1740   ALKPHOS 83 09/15/2020 1740   BILITOT 1.3 (H) 09/15/2020 1740   BILITOT 0.6 03/11/2017 1541   GFRNONAA >60 09/15/2020 1740   GFRAA >60 03/14/2020 0207   Lab Results  Component Value Date   VITAMINB12 85 (L) 09/15/2020   Lab Results  Component Value Date   FOLATE 8.2 09/15/2020   Lab Results  Component Value Date   TSH 1.826 09/15/2020   Drugs of Abuse      Component Value Date/Time   LABOPIA NONE DETECTED 09/15/2020 2059   COCAINSCRNUR NONE DETECTED 09/15/2020 2059   LABBENZ NONE DETECTED 09/15/2020 2059   AMPHETMU NONE DETECTED 09/15/2020 2059   THCU POSITIVE (A) 09/15/2020 2059   LABBARB NONE DETECTED 09/15/2020 2059    Urinalysis    Component Value Date/Time   COLORURINE YELLOW 09/15/2020 2059   APPEARANCEUR CLEAR 09/15/2020 2059   LABSPEC 1.027 09/15/2020 2059   PHURINE 5.0 09/15/2020 2059   GLUCOSEU >=500 (A) 09/15/2020 2059   GLUCOSEU >=1000 (A) 09/02/2020 1254   HGBUR NEGATIVE 09/15/2020 2059   BILIRUBINUR NEGATIVE 09/15/2020 2059   BILIRUBINUR neg 02/07/2015 1526   KETONESUR 80 (A) 09/15/2020 2059   PROTEINUR NEGATIVE 09/15/2020 2059   UROBILINOGEN 0.2 09/02/2020 1254   NITRITE NEGATIVE 09/15/2020 2059   LEUKOCYTESUR NEGATIVE 09/15/2020 2059   Imaging Reviewed: MRI Brain: 1. No acute intracranial abnormality. 2. Generalized age-related cerebral atrophy with mild chronic small vessel ischemic disease, with small remote right cerebellar infarct.  CT Head Code Stroke:  1. No evidence of acute intracranial abnormality. 2. ASPECTS is 10.  Assessment: 75 y.o. 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 He has off and on R facial droop from his hx of bells palsy. His presentation is more consistent with encephalopathy rather than stroke with no focal deficit on exam.  - Examination reveals persistent confusion. - Initial encephalopathic work-up with negative MRI and CT head imaging, THC+ UDS, urinalysis with glucosuria and ketonuria, and a severely depressed vitamin B12 level of 85.  - DDx includes toxic metabolic encephalopathy or drug-induced encephalopathy with or without a component of delirium.   Impression: Acute encephalopathy Vitamin B12 deficiency  Recommendations: - Continue  Thiamine and Vitamin B12 supplementation as you are.  - Recommend rechecking Vitamin B12 level in one month outpatient.  -Would also recommend abstaining from marijuana use.  Anibal Henderson, AGACNP-BC [facility] [phone]   Attending addendum Patient seen and examined Imaging reviewed personally No acute stroke Spoke with patient's wife-who reports continuing and ongoing trouble with memory over the past few months. Suspect this is all due to underlying B12 deficiency Recommendations as above Follow-up with outpatient neurology Inpatient neurology will be available as needed.   -- Amie Portland, MD Neurologist [facility] Pager: [phone]

## 2020-09-16 NOTE — Plan of Care (Signed)

## 2020-09-17 LAB — URINE CULTURE: Culture: NO GROWTH

## 2020-09-20 LAB — METHYLMALONIC ACID, SERUM: Methylmalonic Acid, Quantitative: 191 nmol/L (ref 0–378)

## 2020-09-24 ENCOUNTER — Telehealth: Payer: Self-pay | Admitting: Pharmacist

## 2020-09-24 NOTE — Progress Notes (Addendum)
Chronic Care Management Pharmacy Assistant   Name: Clinton Black  MRN: [id number] DOB: March 20, 1946   Reason for Encounter: General Disease State Call    Recent office visits:  09/02/20 Dr. Scarlette Calico discontinued synjardy due to noncompliance - however patient is getting this through [facility] so it is not in [place]. Rx added back in April.  Recent consult visits:  07/18/20 Dr. Servando [facility], no medication changes 08/21/20 Dr. [facility], no medication changes  Hospital visits:  Medication Reconciliation was completed by comparing discharge summary, patient's EMR and Pharmacy list, and upon discussion with patient.  Admitted to the hospital on 09/15/20 due to Acute Encephalopathy. Discharge date was 09/16/20. Discharged from [place]?Medications Started at [facility] Discharge:?? -started Vitamin B-12 1000 mcg due to low  b-12 85 Also Thicimine Hcl 100 mg, per Dr. Shawna Clamp  Medication Changes at [facility] Discharge: -Changed None ID  Medications Discontinued at Hospital Discharge: -Stopped None ID  Medications that remain the same after Hospital Discharge:??  -All other medications will remain the same.    Medications: Outpatient Encounter Medications as of 09/24/2020  Medication Sig Note   alfuzosin (UROXATRAL) 10 MG 24 hr tablet Take 1 tablet (10 mg total) by mouth daily with breakfast.    atorvastatin (LIPITOR) 20 MG tablet TAKE 1 TABLET AT BEDTIME    Empagliflozin-metFORMIN HCl ER (SYNJARDY XR) 25-1000 MG TB24 Take 1 tablet by mouth daily.    losartan (COZAAR) 25 MG tablet TAKE 2 TABLETS EVERY DAY    metoprolol tartrate (LOPRESSOR) 25 MG tablet Take 0.5 tablets (12.5 mg total) by mouth 2 (two) times daily. 03/20/2020: Pt takes at 09:00 and 21:00   rivaroxaban (XARELTO) 20 MG TABS tablet Take 1 tablet (20 mg total) by mouth daily with supper.    spironolactone (ALDACTONE) 25 MG tablet Take 1 tablet by mouth 2 (two) times daily.     thiamine 100 MG tablet Take 1 tablet (100 mg total) by mouth daily.    triazolam (HALCION) 0.25 MG tablet Take 1 tablet (0.25 mg total) by mouth at bedtime as needed. for sleep    vitamin B-12 (CYANOCOBALAMIN) 1000 MCG tablet Take 1 tablet (1,000 mcg total) by mouth daily.    No facility-administered encounter medications on file as of 09/24/2020.   Pharmacist Review  Have you had any problems recently with your health? The patient states that he went to the hospital last week because he was feeling confused and found out that his b-12 was very low. He states that he was given 2 big doses of B-12 and when he left the hospital he felt better  Have you had any problems with your pharmacy? The patient states that he does not have a problem with getting medications or the cost of medications from the pharmacy  What issues or side effects are you having with your medications?  The patient states that he is not having any side effects from medications as far as he can tell. The patient states that he takes his medications on time.  What would you like me to pass along to [facility] for them to help you with?  The patient states that so far he is going much better and that he has an appointment coming up to see Dr. Ronnald Ramp. The patient stated that he is still taking Synjardy but I stated that it looks like Dr. Ronnald Ramp discontinued it. The patient stated he is still taking. I told the  patient I will mention to the clinical pharmacist to make sure synjardy was discontinued and get back with him  What can we do to take care of you better? The patient states all is well and if anything changes he will call Dr. Valarie Cones Rating Drugs: Atorvastatin 20 mg 08/26/20 90 ds Losartan 25 mg 05/21/20 180, pt states that he has an abundant supply left from previous fill from another [place] Pharmacist Assistant [phone]

## 2020-09-25 LAB — VITAMIN B1: Vitamin B1 (Thiamine): 88.7 nmol/L (ref 66.5–200.0)

## 2020-09-28 ENCOUNTER — Other Ambulatory Visit: Payer: Self-pay | Admitting: Internal Medicine

## 2020-09-28 DIAGNOSIS — G4700 Insomnia, unspecified: Secondary | ICD-10-CM

## 2020-09-28 DIAGNOSIS — G4730 Sleep apnea, unspecified: Secondary | ICD-10-CM

## 2020-10-10 ENCOUNTER — Other Ambulatory Visit: Payer: Self-pay | Admitting: Internal Medicine

## 2020-10-10 DIAGNOSIS — I4819 Other persistent atrial fibrillation: Secondary | ICD-10-CM

## 2020-10-10 DIAGNOSIS — I10 Essential (primary) hypertension: Secondary | ICD-10-CM

## 2020-10-10 DIAGNOSIS — I5032 Chronic diastolic (congestive) heart failure: Secondary | ICD-10-CM

## 2020-10-11 DIAGNOSIS — E119 Type 2 diabetes mellitus without complications: Secondary | ICD-10-CM | POA: Diagnosis not present

## 2020-10-11 LAB — HM DIABETES EYE EXAM

## 2020-11-18 ENCOUNTER — Telehealth: Payer: Self-pay | Admitting: Internal Medicine

## 2020-11-18 ENCOUNTER — Other Ambulatory Visit: Payer: Self-pay | Admitting: Internal Medicine

## 2020-11-18 DIAGNOSIS — I480 Paroxysmal atrial fibrillation: Secondary | ICD-10-CM

## 2020-11-18 MED ORDER — RIVAROXABAN 20 MG PO TABS: 20.0000 mg | ORAL_TABLET | Freq: Every day | ORAL | 1 refills | Status: DC

## 2020-11-18 NOTE — Telephone Encounter (Signed)
   Patient requesting refill for Xarelto  Pharmacy: [facility], fax [phone]

## 2020-11-22 ENCOUNTER — Other Ambulatory Visit: Payer: Self-pay | Admitting: Internal Medicine

## 2020-11-22 DIAGNOSIS — G4700 Insomnia, unspecified: Secondary | ICD-10-CM

## 2020-11-27 ENCOUNTER — Telehealth: Payer: Self-pay | Admitting: Pharmacist

## 2020-11-27 NOTE — Progress Notes (Signed)
    Chronic Care Management Pharmacy Assistant   Name: Clinton Black  MRN: [id number] DOB: 19-Apr-1946  Reason for Encounter: Disease State - General Adherence   Recent office visits:  None noted  Recent consult visits:  None noted  Hospital visits:  Medication Reconciliation was completed by comparing discharge summary, patient's EMR and Pharmacy list, and upon discussion with patient.  Admitted to the hospital on 09/15/20 due to Acute encephalopathy. Discharge date was 09/15/20. Discharged from 09/16/20 [place]?Medications Started at [facility] Discharge:?? -started thiamine & Vitamin B12.  Medications that remain the same after Hospital Discharge:??  -All other medications will remain the same.    Medications: Outpatient Encounter Medications as of 11/27/2020  Medication Sig Note   alfuzosin (UROXATRAL) 10 MG 24 hr tablet Take 1 tablet (10 mg total) by mouth daily with breakfast.    atorvastatin (LIPITOR) 20 MG tablet TAKE 1 TABLET AT BEDTIME    Empagliflozin-metFORMIN HCl ER (SYNJARDY XR) 25-1000 MG TB24 Take 1 tablet by mouth daily. 09/24/2020: Via [employer] PAP 2022   losartan (COZAAR) 25 MG tablet TAKE 2 TABLETS EVERY DAY    metoprolol tartrate (LOPRESSOR) 25 MG tablet TAKE 1/2 TABLET TWICE DAILY    rivaroxaban (XARELTO) 20 MG TABS tablet Take 1 tablet (20 mg total) by mouth daily with supper.    spironolactone (ALDACTONE) 25 MG tablet TAKE 1/2 TABLET AT BEDTIME    thiamine 100 MG tablet Take 1 tablet (100 mg total) by mouth daily.    triazolam (HALCION) 0.25 MG tablet TAKE 1 TABLET (0.25 MG TOTAL) BY MOUTH AT BEDTIME AS NEEDED. FOR SLEEP    vitamin B-12 (CYANOCOBALAMIN) 1000 MCG tablet Take 1 tablet (1,000 mcg total) by mouth daily.    No facility-administered encounter medications on file as of 11/27/2020.   Have you had any problems recently with your health? Patient states he has lost 80lbs since being in the hospital in April, he walks his dog 6x a day and tries to  get in 7000-8000 steps.  Have you had any problems with your pharmacy? Patient states [employer] changed to [employer] and they have been fine with getting his meds to him on time.   What issues or side effects are you having with your medications? Patient states he gets dizzy sometimes and is not sure if its cause by one of the meds he take or him getting up to fast.   What would you like me to pass along to [facility] for them to help you with?  Patient states no concerns at this time.   What can we do to take care of you better? Patient states he don't know when his next appointment with Dr. Ronnald Ramp is, after checking I informed him no appointment was scheduled and to call the office and he is due for a follow up.    Star Rating Drugs: Atorvastatin - last fill 08/26/20 90D Losartan - last fill 08/04/20 90D (Patient states 10/12/20 180D) Empagliflozin-metFORMIN HCl ER - last fill 12/04/19 90D (D/C)  Orinda Kenner, RMA Clinical Pharmacists Assistant [phone]  Time Spent: [phone]

## 2020-12-11 ENCOUNTER — Ambulatory Visit (INDEPENDENT_AMBULATORY_CARE_PROVIDER_SITE_OTHER): Payer: Medicare HMO | Admitting: Internal Medicine

## 2020-12-11 ENCOUNTER — Encounter: Payer: Self-pay | Admitting: Internal Medicine

## 2020-12-11 ENCOUNTER — Other Ambulatory Visit: Payer: Self-pay

## 2020-12-11 ENCOUNTER — Ambulatory Visit (INDEPENDENT_AMBULATORY_CARE_PROVIDER_SITE_OTHER): Payer: Medicare HMO

## 2020-12-11 VITALS — BP 136/70 | HR 88 | Temp 97.9°F | Ht 76.0 in | Wt 238.0 lb

## 2020-12-11 DIAGNOSIS — F419 Anxiety disorder, unspecified: Secondary | ICD-10-CM | POA: Diagnosis not present

## 2020-12-11 DIAGNOSIS — Z8673 Personal history of transient ischemic attack (TIA), and cerebral infarction without residual deficits: Secondary | ICD-10-CM | POA: Diagnosis not present

## 2020-12-11 DIAGNOSIS — E538 Deficiency of other specified B group vitamins: Secondary | ICD-10-CM

## 2020-12-11 DIAGNOSIS — E559 Vitamin D deficiency, unspecified: Secondary | ICD-10-CM

## 2020-12-11 DIAGNOSIS — E118 Type 2 diabetes mellitus with unspecified complications: Secondary | ICD-10-CM | POA: Diagnosis not present

## 2020-12-11 DIAGNOSIS — R972 Elevated prostate specific antigen [PSA]: Secondary | ICD-10-CM

## 2020-12-11 DIAGNOSIS — R413 Other amnesia: Secondary | ICD-10-CM | POA: Diagnosis not present

## 2020-12-11 DIAGNOSIS — R079 Chest pain, unspecified: Secondary | ICD-10-CM | POA: Diagnosis not present

## 2020-12-11 DIAGNOSIS — R2689 Other abnormalities of gait and mobility: Secondary | ICD-10-CM

## 2020-12-11 DIAGNOSIS — F32A Depression, unspecified: Secondary | ICD-10-CM | POA: Diagnosis not present

## 2020-12-11 LAB — URINALYSIS, ROUTINE W REFLEX MICROSCOPIC
Bilirubin Urine: NEGATIVE
Hgb urine dipstick: NEGATIVE
Ketones, ur: NEGATIVE
Leukocytes,Ua: NEGATIVE
Nitrite: NEGATIVE
RBC / HPF: NONE SEEN (ref 0–?)
Specific Gravity, Urine: 1.02 (ref 1.000–1.030)
Total Protein, Urine: NEGATIVE
Urine Glucose: >=1000 — AB
Urobilinogen, UA: 1 (ref 0.0–1.0)
WBC, UA: NONE SEEN (ref 0–?)
pH: 5.5 (ref 5.0–8.0)

## 2020-12-11 LAB — CBC WITH DIFFERENTIAL/PLATELET
Basophils Absolute: 0.1 10*3/uL (ref 0.0–0.1)
Basophils Relative: 0.9 % (ref 0.0–3.0)
Eosinophils Absolute: 0.6 10*3/uL (ref 0.0–0.7)
Eosinophils Relative: 6.3 % — ABNORMAL HIGH (ref 0.0–5.0)
HCT: 41.7 % (ref 39.0–52.0)
Hemoglobin: 14 g/dL (ref 13.0–17.0)
Lymphocytes Relative: 20.2 % (ref 12.0–46.0)
Lymphs Abs: 1.8 10*3/uL (ref 0.7–4.0)
MCHC: 33.5 g/dL (ref 30.0–36.0)
MCV: 87.3 fl (ref 78.0–100.0)
Monocytes Absolute: 0.5 10*3/uL (ref 0.1–1.0)
Monocytes Relative: 5.8 % (ref 3.0–12.0)
Neutro Abs: 5.9 10*3/uL (ref 1.4–7.7)
Neutrophils Relative %: 66.8 % (ref 43.0–77.0)
Platelets: 185 10*3/uL (ref 150.0–400.0)
RBC: 4.77 Mil/uL (ref 4.22–5.81)
RDW: 14.1 % (ref 11.5–15.5)
WBC: 8.9 10*3/uL (ref 4.0–10.5)

## 2020-12-11 LAB — HEMOGLOBIN A1C: Hgb A1c MFr Bld: 6 % (ref 4.6–6.5)

## 2020-12-11 LAB — LIPID PANEL
Cholesterol: 98 mg/dL (ref 0–200)
HDL: 35.6 mg/dL — ABNORMAL LOW (ref >39.00)
LDL Cholesterol: 40 mg/dL (ref 0–99)
NonHDL: 62.29
Total CHOL/HDL Ratio: 3
Triglycerides: 111 mg/dL (ref 0.0–149.0)
VLDL: 22.2 mg/dL (ref 0.0–40.0)

## 2020-12-11 LAB — HEPATIC FUNCTION PANEL
ALT: 9 U/L (ref 0–53)
AST: 10 U/L (ref 0–37)
Albumin: 4.6 g/dL (ref 3.5–5.2)
Alkaline Phosphatase: 98 U/L (ref 39–117)
Bilirubin, Direct: 0.2 mg/dL (ref 0.0–0.3)
Total Bilirubin: 0.8 mg/dL (ref 0.2–1.2)
Total Protein: 6.7 g/dL (ref 6.0–8.3)

## 2020-12-11 LAB — BASIC METABOLIC PANEL
BUN: 23 mg/dL (ref 6–23)
CO2: 24 mEq/L (ref 19–32)
Calcium: 9.9 mg/dL (ref 8.4–10.5)
Chloride: 104 mEq/L (ref 96–112)
Creatinine, Ser: 0.98 mg/dL (ref 0.40–1.50)
GFR: 76.5 mL/min (ref >60.00)
Glucose, Bld: 101 mg/dL — ABNORMAL HIGH (ref 70–99)
Potassium: 4.5 mEq/L (ref 3.5–5.1)
Sodium: 139 mEq/L (ref 135–145)

## 2020-12-11 LAB — MICROALBUMIN / CREATININE URINE RATIO
Creatinine,U: 84.7 mg/dL
Microalb Creat Ratio: 0.9 mg/g (ref 0.0–30.0)
Microalb, Ur: 0.8 mg/dL (ref 0.0–1.9)

## 2020-12-11 LAB — VITAMIN D 25 HYDROXY (VIT D DEFICIENCY, FRACTURES): VITD: 21.65 ng/mL — ABNORMAL LOW (ref 30.00–100.00)

## 2020-12-11 LAB — TSH: TSH: 1.69 u[IU]/mL (ref 0.35–4.50)

## 2020-12-11 LAB — PSA: PSA: 4.43 ng/mL — ABNORMAL HIGH (ref 0.10–4.00)

## 2020-12-11 LAB — VITAMIN B12: Vitamin B-12: 1099 pg/mL — ABNORMAL HIGH (ref 211–911)

## 2020-12-11 IMAGING — DX DG CHEST 2V
3 series · 3 of 3 positions shown · non-contrast
Comparison: [DATE].

CLINICAL DATA: Recurring intermittent chest pains.

EXAM:
CHEST - 2 VIEW

[chest pa (1 of 2)]
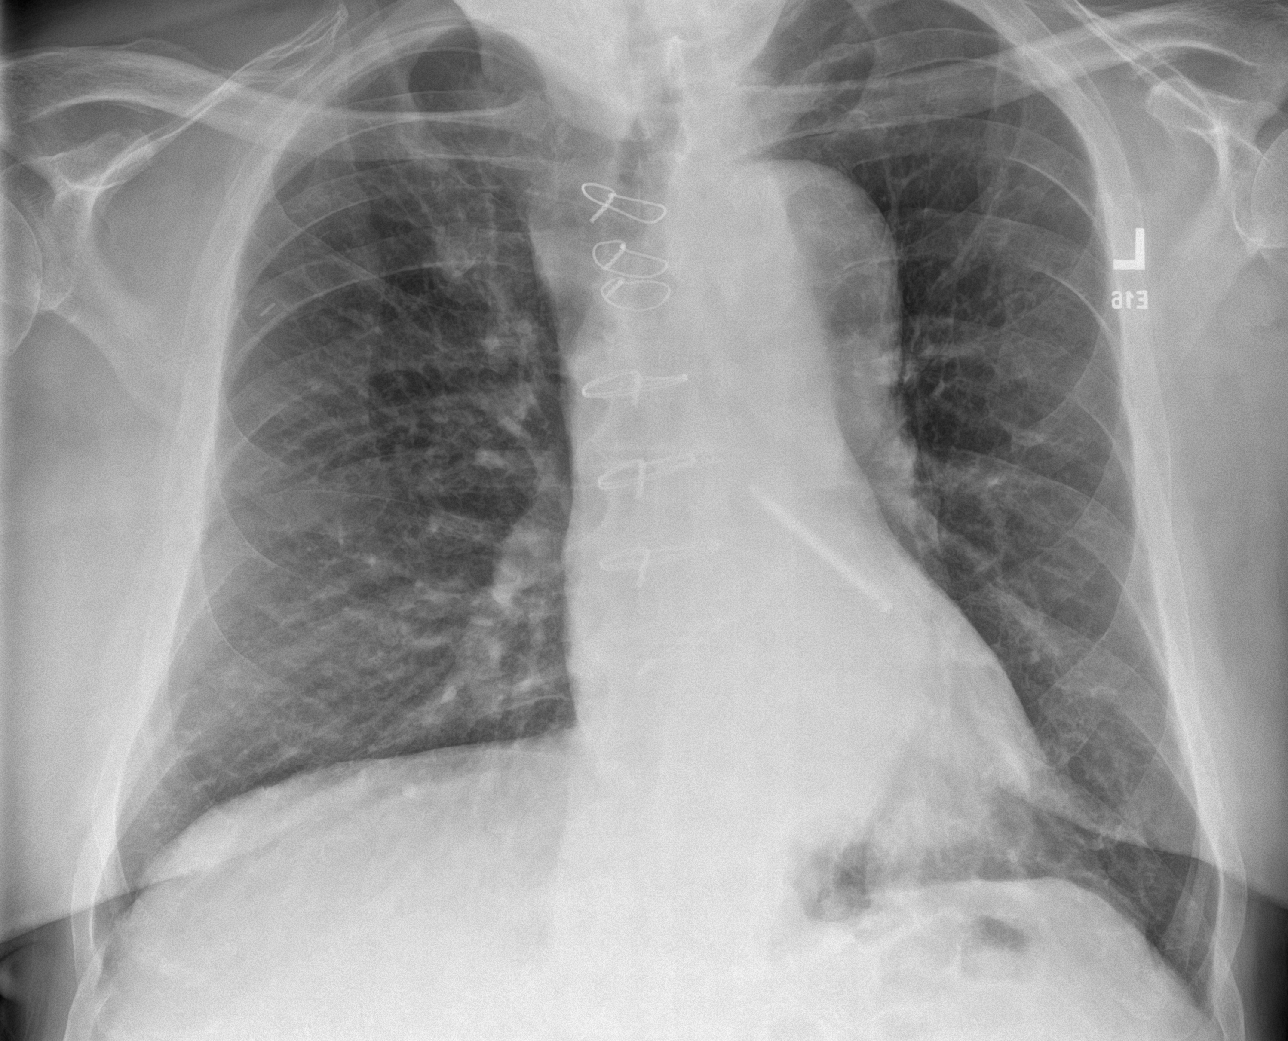

[chest lat]
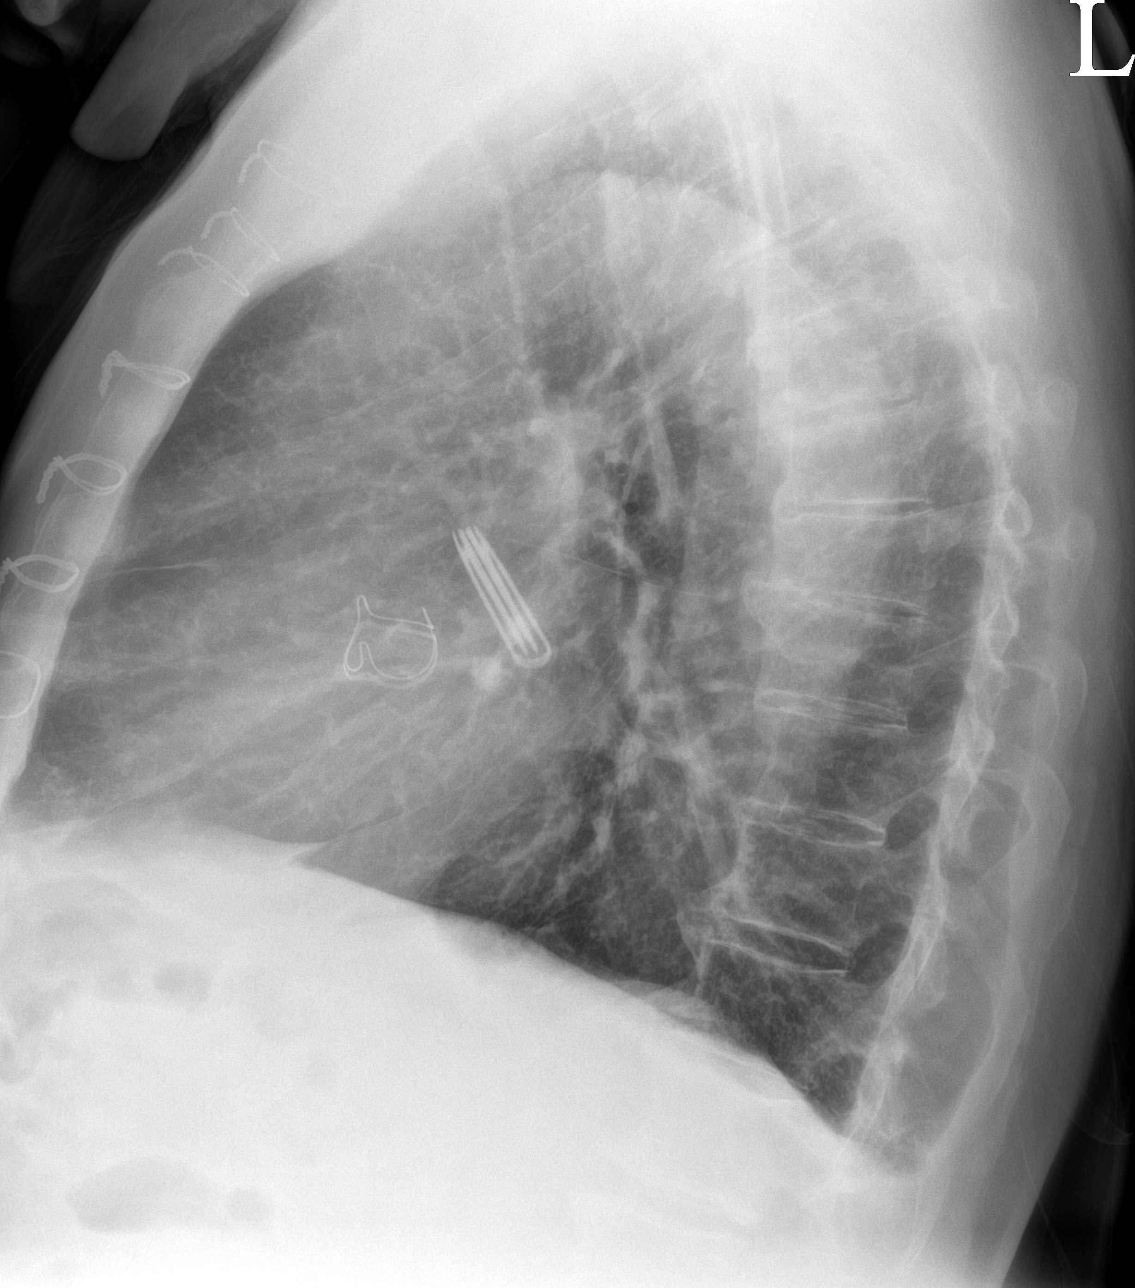

[chest pa (2 of 2)]
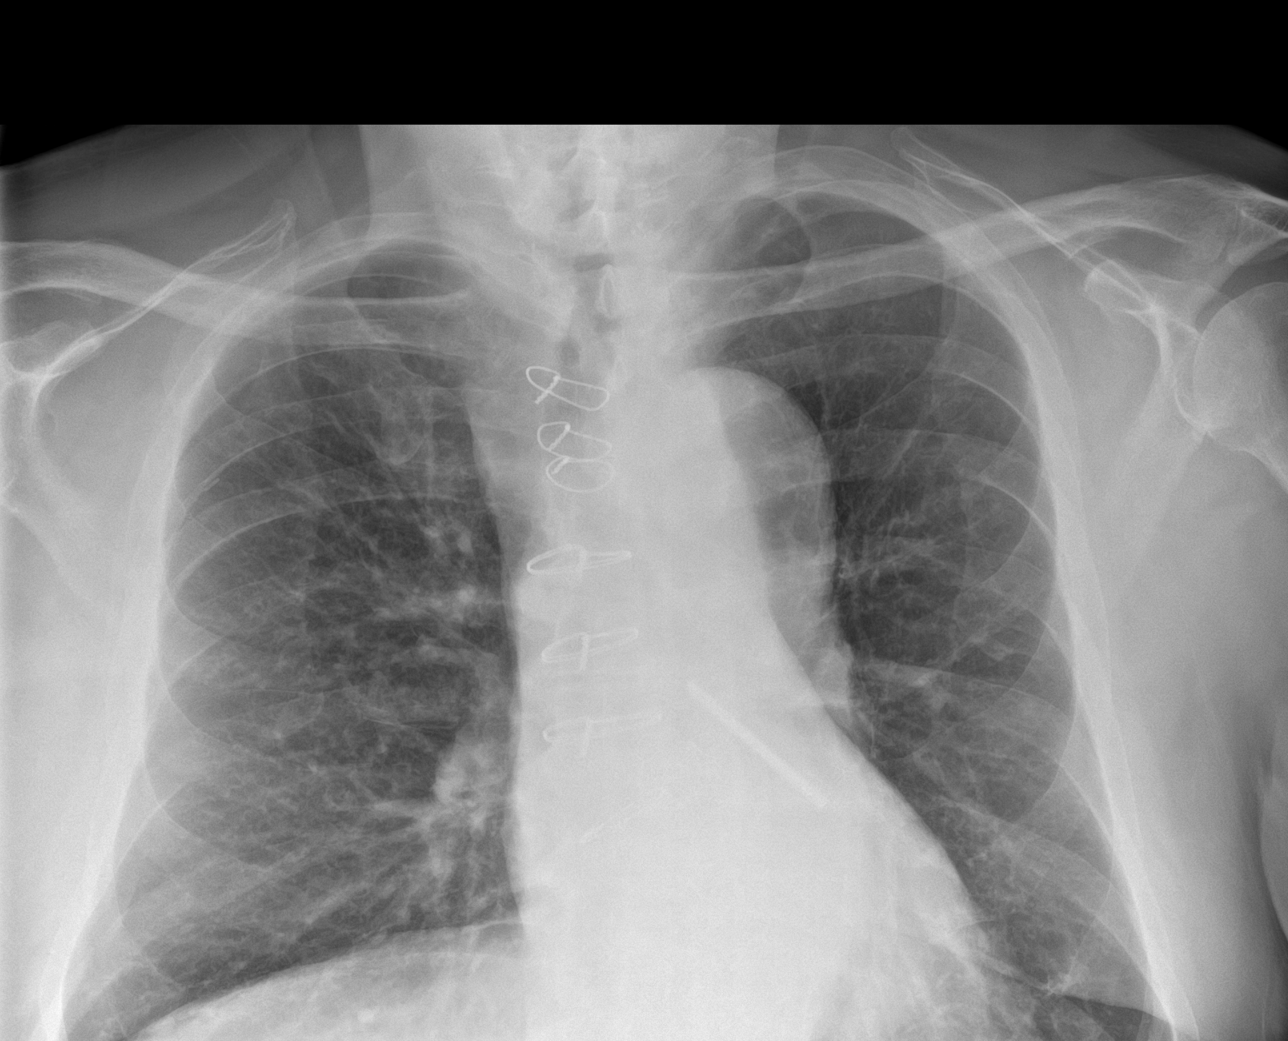

[3 of 3 positions shown; findings below may reference images not displayed]

FINDINGS: Similar cardiomediastinal silhouette. Similar mild prominence of the
right paratracheal stripe is unchanged. Tortuous aorta, also
similar. Postsurgical changes of median sternotomy, aortic valve
replacement and ascending aortic repair. No consolidation. No
visible pleural effusions or pneumothorax. Degenerative changes of
the thoracic spine.
IMPRESSION: 1. No radiographic evidence of acute cardiopulmonary disease.
2. If clinically indicated, CTA chest could better evaluate the
aorta in this patient status post aortic valve replacement and
ascending aortic repair with known aneurysmal dilation of the native
ascending thoracic aorta.

## 2020-12-11 MED ORDER — CITALOPRAM HYDROBROMIDE 20 MG PO TABS: 20.0000 mg | ORAL_TABLET | Freq: Every day | ORAL | 3 refills | Status: DC

## 2020-12-11 NOTE — Progress Notes (Signed)
    Chronic Care Management Pharmacy Assistant   Name: Clinton Black  MRN: [id number] DOB: 11/05/1945   Reason for Encounter: Chart Review    Medications: Outpatient Encounter Medications as of 11/27/2020  Medication Sig Note   alfuzosin (UROXATRAL) 10 MG 24 hr tablet Take 1 tablet (10 mg total) by mouth daily with breakfast.    atorvastatin (LIPITOR) 20 MG tablet TAKE 1 TABLET AT BEDTIME    Empagliflozin-metFORMIN HCl ER (SYNJARDY XR) 25-1000 MG TB24 Take 1 tablet by mouth daily. 09/24/2020: Via [employer] PAP 2022   losartan (COZAAR) 25 MG tablet TAKE 2 TABLETS EVERY DAY    metoprolol tartrate (LOPRESSOR) 25 MG tablet TAKE 1/2 TABLET TWICE DAILY    rivaroxaban (XARELTO) 20 MG TABS tablet Take 1 tablet (20 mg total) by mouth daily with supper.    spironolactone (ALDACTONE) 25 MG tablet TAKE 1/2 TABLET AT BEDTIME    thiamine 100 MG tablet Take 1 tablet (100 mg total) by mouth daily. (Patient not taking: Reported on 12/11/2020)    triazolam (HALCION) 0.25 MG tablet TAKE 1 TABLET (0.25 MG TOTAL) BY MOUTH AT BEDTIME AS NEEDED. FOR SLEEP    vitamin B-12 (CYANOCOBALAMIN) 1000 MCG tablet Take 1 tablet (1,000 mcg total) by mouth daily.    No facility-administered encounter medications on file as of 11/27/2020.   Pharmacist Review  Reviewed chart for medication changes and adherence.  Recent OV, Consult or Hospital visit: 12/11/20 Dr. Cathlean Cower, Internal Medicine Recent medication changes indicated: Citalopram Hydrobromide 20 mg daily  No gaps in adherence identified. Patient has follow up scheduled with pharmacy team. No further action required.   [place] Pharmacist Assistant [phone]   Time spent:5

## 2020-12-11 NOTE — Patient Instructions (Signed)
Please take all new medication as prescribed - the celexa for anxiety and depression  Please continue all other medications as before, and refills have been done if requested.  Please have the pharmacy call with any other refills you may need.  Please continue your efforts at being more active, low cholesterol diet, and weight control.  Please keep your appointments with your specialists as you may have planned  You will be contacted regarding the referral for: Neurology  Please go to the XRAY Department in the first floor for the x-ray testing  Please go to the LAB at the blood drawing area for the tests to be done  You will be contacted by phone if any changes need to be made immediately.  Otherwise, you will receive a letter about your results with an explanation, but please check with MyChart first.  Please remember to sign up for MyChart if you have not done so, as this will be important to you in the future with finding out test results, communicating by private email, and scheduling acute appointments online when needed.  Please make an Appointment to return for your 1 month visit with Dr Ronnald Ramp

## 2020-12-11 NOTE — Progress Notes (Signed)
Patient ID: Clinton Black, male   DOB: [birth year], 75 y.o.   MRN: [id number]        Chief Complaint: memory changes ;      HPI:  Clinton Black is a 74 y.o. male here with c/o -  1 yr ago had CV surgury at 312 lbs;  in April 2022 starting noticing confusion with hallucination and saw his deceased dog in the bedroom  Was seen in ED and had low b12, and only rarely missed a b12 pill since then.  Got lost driving to [employer] as he often does, and lives at current address for 4 yrs, and instead found himself in [place] and has to call someone to come find him.  Friend drove himself here today, though he was here in April and not sure if he would get lost.  No other recent med changes.  Has a long list of all his symptoms he wants to not forget today, including memory difficulty, confusion, exhuastion, fleeting anterior chest pains, wt loss, chronic DM with 1 yr mid left leg chronic venous wound now improving, and increased balance difficulty, and dyspnea but the latter he attributes to being emotional and upset today.  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 with marked stress, also new dog is more work than he expected       [facility] Readings from Last 3 Encounters:  12/11/20 238 lb (108 kg)  09/15/20 252 lb (114.3 kg)  09/02/20 262 lb (118.8 kg)   BP Readings from Last 3 Encounters:  12/11/20 136/70  09/16/20 118/80  09/02/20 140/86         Past Medical History:  Diagnosis Date   Arthritis    Atrial fibrillation (HCC)    BPH (benign prostatic hyperplasia)    Diabetes mellitus type 2 in obese ([place])    diet controlled   HTN (hypertension)    Hyperlipidemia, mixed    Insomnia    Obesity    Thoracic aortic aneurysm, without rupture ([place])    4.7 cm per chest ct with contrast 11-04-17   Past Surgical History:  Procedure Laterality Date   AORTIC VALVE REPLACEMENT N/A 10/16/2019   Procedure: AORTIC VALVE REPLACEMENT (AVR) using Edwards PERIMOUNT Magna Ease 25MM Bioprosthesis Aortic Valve.;  Surgeon:  [name], MD;  Location: [place];  Service: Open Heart Surgery;  Laterality: N/A;  Right subclavian artery cannulation.   AORTIC VALVE REPLACEMENT  10/16/2019   AORTIC VALVE REPLACEMENT (AVR)    ATRIAL FIBRILLATION ABLATION N/A 04/07/2019   Procedure: ATRIAL FIBRILLATION ABLATION;  Surgeon: [name], MD;  Location: [place] [facility];  Service: Cardiovascular;  Laterality: N/A;   CARDIOVERSION N/A 07/12/2018   Procedure: CARDIOVERSION;  Surgeon: Acie Fredrickson Wonda Cheng, MD;  Location: [facility];  Service: Cardiovascular;  Laterality: N/A;   CARDIOVERSION N/A 08/22/2018   Procedure: CARDIOVERSION;  Surgeon: Skeet Latch, MD;  Location: [place];  Service: Cardiovascular;  Laterality: N/A;   CARDIOVERSION N/A 06/28/2019   Procedure: CARDIOVERSION;  Surgeon: Acie Fredrickson Wonda Cheng, MD;  Location: [facility] END[facility]COPY;  Service: Cardiovascular;  Laterality: N/A;   CLIPPING OF ATRIAL APPENDAGE N/A 10/16/2019   Procedure: Clipping Of Atrial Appendage using AtriCure AtriClip Exclusion VLAA 65mm.;  Surgeon: [name], MD;  Location: [place];  Service: Open Heart Surgery;  Laterality: N/A;   COLONOSCOPY  2016   CYST EXCISION     polynomial   CYSTOSCOPY WITH INSERTION OF UROLIFT N/A 04/18/2018   Procedure: CYSTOSCOPY WITH INSERTION OF  UROLIFT;  Surgeon: Cleon Gustin, MD;  Location: [facility];  Service: Urology;  Laterality: N/A;   KNEE SURGERY Right 2000   arthroscopy   MAZE N/A 10/16/2019   Procedure: MAZE with bilateral pulmonary vein isolation.;  Surgeon: [name], MD;  Location: [place];  Service: Open Heart Surgery;  Laterality: N/A;  Maze procedure using Atricure OLL2 Isolator clamp.   REPLACEMENT ASCENDING AORTA N/A 10/16/2019   Procedure: SUPRA CORONARY REPLACEMENT OF ASCENDING AORTA using Maquet Hemashiel Platinum 34 MM Vascular Graft.;  Surgeon: [name], MD;  Location: [place];  Service: Open Heart Surgery;  Laterality: N/A;  Right subclavian  artery cannulation.   RIGHT/LEFT HEART CATH AND CORONARY ANGIOGRAPHY N/A 01/30/2019   Procedure: RIGHT/LEFT HEART CATH AND CORONARY ANGIOGRAPHY;  Surgeon: [name], [name], MD;  Location: [place] [facility];  Service: Cardiovascular;  Laterality: N/A;   TEE WITHOUT CARDIOVERSION N/A 10/16/2019   Procedure: TRANSESOPHAGEAL ECHOCARDIOGRAM (TEE);  Surgeon: [name], MD;  Location: [place];  Service: Open Heart Surgery;  Laterality: N/A;   TONSILLECTOMY     widson teeth extraction      reports that he quit smoking about 26 years ago. His smoking use included cigarettes. He has a 7.50 pack-year smoking history. He has never used smokeless tobacco. He reports previous alcohol use of about 14.0 standard drinks of alcohol per week. He reports current drug use. Drug: Marijuana. family history includes Aneurysm in his father; Arthritis in his mother; Heart disease in his father; Macular degeneration in his mother; Varicose Veins in his mother. Allergies  Allergen Reactions   Quinolones Other (See Comments)    Patient was warned about not using Cipro and similar antibiotics. Recent studies have raised concern that fluoroquinolone antibiotics could be associated with an increased risk of aortic aneurysm Fluoroquinolones have non-antimicrobial properties that might jeopardise the integrity of the extracellular matrix of the vascular wall In a  propensity score matched cohort study in [place], there was a 66% increased rate of aortic aneurysm or dissection associated with oral fluoroquinolone use, compared wit   Current Outpatient Medications on File Prior to Visit  Medication Sig Dispense Refill   alfuzosin (UROXATRAL) 10 MG 24 hr tablet Take 1 tablet (10 mg total) by mouth daily with breakfast. 90 tablet 1   atorvastatin (LIPITOR) 20 MG tablet TAKE 1 TABLET AT BEDTIME 90 tablet 1   Empagliflozin-metFORMIN HCl ER (SYNJARDY XR) 25-1000 MG TB24 Take 1 tablet by mouth daily.     losartan (COZAAR) 25 MG  tablet TAKE 2 TABLETS EVERY DAY 180 tablet 3   metoprolol tartrate (LOPRESSOR) 25 MG tablet TAKE 1/2 TABLET TWICE DAILY 90 tablet 1   rivaroxaban (XARELTO) 20 MG TABS tablet Take 1 tablet (20 mg total) by mouth daily with supper. 90 tablet 1   spironolactone (ALDACTONE) 25 MG tablet TAKE 1/2 TABLET AT BEDTIME 45 tablet 1   triazolam (HALCION) 0.25 MG tablet TAKE 1 TABLET (0.25 MG TOTAL) BY MOUTH AT BEDTIME AS NEEDED. FOR SLEEP 30 tablet 2   vitamin B-12 (CYANOCOBALAMIN) 1000 MCG tablet Take 1 tablet (1,000 mcg total) by mouth daily. 30 tablet 1   thiamine 100 MG tablet Take 1 tablet (100 mg total) by mouth daily. (Patient not taking: Reported on 12/11/2020) 30 tablet 1   No current facility-administered medications on file prior to visit.        ROS:  All others reviewed and negative.  Objective  PE:  BP 136/70 (BP Location: Right Arm, Patient Position: Sitting, Cuff Size: Large)   Pulse 88   Temp 97.9 F (36.6 C) (Oral)   Ht 6\' 4"  (1.93 m)   Wt 238 lb (108 kg)   SpO2 93%   BMI 28.97 kg/m                 Constitutional: Pt appears in NAD               HENT: Head: NCAT.                Right Ear: External ear normal.                 Left Ear: External ear normal.                Eyes: . Pupils are equal, round, and reactive to light. Conjunctivae and EOM are normal               Nose: without d/c or deformity               Neck: Neck supple. Gross normal ROM               Cardiovascular: Normal rate and regular rhythm.                 Pulmonary/Chest: Effort normal and breath sounds without rales or wheezing.                Abd:  Soft, NT, ND, + BS, no organomegaly               Neurological: Pt is alert. At baseline orientation, motor grossly intact               Skin: Skin is warm. No rashes, no other new lesions, LE edema - none               Psychiatric: Pt behavior is normal without agitation , 2+ nervous, depressed affect  Micro: none  Cardiac tracings I have  personally interpreted today:  none  Pertinent Radiological findings (summarize): 09/14/2020 MRI brain IMPRESSION: 1. No acute intracranial abnormality. 2. Generalized age-related cerebral atrophy with mild chronic small vessel ischemic disease, with small remote right cerebellar infarct.   Lab Results  Component Value Date   WBC 8.9 12/11/2020   HGB 14.0 12/11/2020   HCT 41.7 12/11/2020   PLT 185.0 12/11/2020   GLUCOSE 101 (H) 12/11/2020   CHOL 98 12/11/2020   TRIG 111.0 12/11/2020   HDL 35.60 (L) 12/11/2020   LDLDIRECT 78.0 05/29/2019   LDLCALC 40 12/11/2020   ALT 9 12/11/2020   AST 10 12/11/2020   NA 139 12/11/2020   K 4.5 12/11/2020   CL 104 12/11/2020   CREATININE 0.98 12/11/2020   BUN 23 12/11/2020   CO2 24 12/11/2020   TSH 1.69 12/11/2020   PSA 4.43 (H) 12/11/2020   INR 1.4 (H) 09/15/2020   HGBA1C 6.0 12/11/2020   MICROALBUR 0.8 12/11/2020   Assessment/Plan:  Clinton Black is a 75 y.o. White or Caucasian [1] male with  has a past medical history of Arthritis, Atrial fibrillation ([place]), BPH (benign prostatic hyperplasia), Diabetes mellitus type 2 in obese ([place]), HTN (hypertension), Hyperlipidemia, mixed, Insomnia, Obesity, and Thoracic aortic aneurysm, without rupture ([place]).  Anxiety and depression With increased recent stressors. For celexa 20 qd, f/u PCP in 1 mo, declines psychiatry referral for now  B12 deficiency Lab Results  Component Value  Date   VITAMINB12 1,099 (H) 12/11/2020   Stable, cont oral replacement - b12 1000 mcg qd  History of stroke involving cerebellum D/w pt - ? Related to balance difficulty, for f/u lab as ordered, cxr, and neurology referral  Memory changes ? 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  Cathlean Cower, MD 12/15/2020 3:20 PM [facility]

## 2020-12-12 ENCOUNTER — Encounter: Payer: Self-pay | Admitting: Internal Medicine

## 2020-12-12 ENCOUNTER — Encounter: Payer: Self-pay | Admitting: Physician Assistant

## 2020-12-13 ENCOUNTER — Encounter: Payer: Self-pay | Admitting: Internal Medicine

## 2020-12-15 ENCOUNTER — Encounter: Payer: Self-pay | Admitting: Internal Medicine

## 2020-12-15 NOTE — Assessment & Plan Note (Signed)
Pt also reqeusts f/u psa

## 2020-12-15 NOTE — Assessment & Plan Note (Signed)
Lab Results  Component Value Date   HGBA1C 6.0 12/11/2020   Stable, pt to continue current medical treatment - synjardy

## 2020-12-15 NOTE — Assessment & Plan Note (Signed)
With increased recent stressors. For celexa 20 qd, f/u PCP in 1 mo, declines psychiatry referral for now

## 2020-12-15 NOTE — Assessment & Plan Note (Signed)
Lab Results  Component Value Date   VITAMINB12 1,099 (H) 12/11/2020   Stable, cont oral replacement - b12 1000 mcg qd

## 2020-12-15 NOTE — Assessment & Plan Note (Signed)
?   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

## 2020-12-15 NOTE — Assessment & Plan Note (Signed)
D/w pt - ? Related to balance difficulty, for f/u lab as ordered, cxr, and neurology referral

## 2020-12-15 NOTE — Assessment & Plan Note (Signed)
Etiology unclear, declines mri, for neurology referral

## 2020-12-23 ENCOUNTER — Ambulatory Visit (INDEPENDENT_AMBULATORY_CARE_PROVIDER_SITE_OTHER): Payer: Medicare HMO | Admitting: Pharmacist

## 2020-12-23 ENCOUNTER — Other Ambulatory Visit: Payer: Self-pay

## 2020-12-23 DIAGNOSIS — E785 Hyperlipidemia, unspecified: Secondary | ICD-10-CM

## 2020-12-23 DIAGNOSIS — F419 Anxiety disorder, unspecified: Secondary | ICD-10-CM

## 2020-12-23 DIAGNOSIS — E118 Type 2 diabetes mellitus with unspecified complications: Secondary | ICD-10-CM

## 2020-12-23 DIAGNOSIS — F32A Depression, unspecified: Secondary | ICD-10-CM

## 2020-12-23 DIAGNOSIS — I480 Paroxysmal atrial fibrillation: Secondary | ICD-10-CM

## 2020-12-23 DIAGNOSIS — I10 Essential (primary) hypertension: Secondary | ICD-10-CM | POA: Diagnosis not present

## 2020-12-23 DIAGNOSIS — Z8673 Personal history of transient ischemic attack (TIA), and cerebral infarction without residual deficits: Secondary | ICD-10-CM

## 2020-12-23 DIAGNOSIS — I5032 Chronic diastolic (congestive) heart failure: Secondary | ICD-10-CM

## 2020-12-23 NOTE — Progress Notes (Signed)
Chronic Care Management Pharmacy Note  12/23/2020 Name:  Clinton Black MRN:  [id number] DOB:  Nov 04, 1945  Summary: -Pt reports some improvement in anxiety after taking citalopram -Pt is still struggling with memory - got lost for 7hrs on [address] last week -Pt reports apple-juice colored urine for past 24 hrs; denies muscle aches, abdominal pain, frequent urination, dysuria -Pt has lost 9% body weight since 09/02/20 unintentionally  -Pt has not started Vitamin D yet  Recommendations/Changes made from today's visit: -Keep appt with neurology 7/13 @ 9:30am and PCP 7/28 @ 11:20 -Increase water intake; if urine color does not normalize or muscle aches/abdominal pain develops, make appt with PCP -Advised to start Vitamin D 2000 IU daily   Subjective: Clinton Black is an 75 y.o. year old male who is a primary patient of [name], MD.  The CCM team was consulted for assistance with disease management and care coordination needs.    Engaged with patient by telephone for follow up visit in response to provider referral for pharmacy case management and/or care coordination services.   Consent to Services:  The patient was given information about Chronic Care Management services, agreed to services, and gave verbal consent prior to initiation of services.  Please see initial visit note for detailed documentation.   Patient Care Team: [name], MD as PCP - General (Internal Medicine) [name], [name], MD as PCP - Cardiology (Cardiology) [name], MD as PCP - Electrophysiology (Cardiology) Charlton Haws, [facility] as Pharmacist (Pharmacist)  Recent office visits: 12/11/20 Dr Jenny Reichmann OV: c/o confusion, hallucination, getting lost while driving. Rx'd citalopram 20 mg. Referred to neurology. Labs stable except low vit D, advised OTC vit D 2000 IU. FU w/ PCP 4 wks.  Recent consult visits: None  Hospital visits: Medication Reconciliation was completed by comparing  discharge summary, patient's EMR and Pharmacy list, and upon discussion with patient.   Admitted to the hospital on 09/15/20 due to Confusion, Acute encephalopathy. Discharge date was 09/16/20. Discharged from [place]?Medications Started at [facility] Discharge:?? -started thiamine & Vitamin B12.   Medications that remain the same after Hospital Discharge:?? -All other medications will remain the same.     Objective:  Lab Results  Component Value Date   CREATININE 0.98 12/11/2020   BUN 23 12/11/2020   GFR 76.50 12/11/2020   GFRNONAA >60 09/15/2020   GFRAA >60 03/14/2020   NA 139 12/11/2020   K 4.5 12/11/2020   CALCIUM 9.9 12/11/2020   CO2 24 12/11/2020   GLUCOSE 101 (H) 12/11/2020    Lab Results  Component Value Date/Time   HGBA1C 6.0 12/11/2020 11:45 AM   HGBA1C 6.2 09/02/2020 12:54 PM   GFR 76.50 12/11/2020 11:45 AM   GFR 77.60 09/02/2020 12:54 PM   MICROALBUR 0.8 12/11/2020 11:45 AM   MICROALBUR 1.7 09/02/2020 12:54 PM    Last diabetic Eye exam:  Lab Results  Component Value Date/Time   HMDIABEYEEXA No Retinopathy 10/11/2020 12:00 AM    Last diabetic Foot exam: No results found for: HMDIABFOOTEX   Lab Results  Component Value Date   CHOL 98 12/11/2020   HDL 35.60 (L) 12/11/2020   LDLCALC 40 12/11/2020   LDLDIRECT 78.0 05/29/2019   TRIG 111.0 12/11/2020   CHOLHDL 3 12/11/2020    Hepatic Function Latest Ref Rng & Units 12/11/2020 09/15/2020 09/02/2020  Total Protein 6.0 - 8.3 g/dL 6.7 6.8 6.6  Albumin 3.5 - 5.2 g/dL 4.6 4.3  4.6  AST 0 - 37 U/L 10 17 13   ALT 0 - 53 U/L 9 15 10   Alk Phosphatase 39 - 117 U/L 98 83 92  Total Bilirubin 0.2 - 1.2 mg/dL 0.8 1.3(H) 0.9  Bilirubin, Direct 0.0 - 0.3 mg/dL 0.2 - 0.2    Lab Results  Component Value Date/Time   TSH 1.69 12/11/2020 11:45 AM   TSH 1.826 09/15/2020 08:22 PM   TSH 1.73 09/02/2020 12:54 PM    CBC Latest Ref Rng & Units 12/11/2020 09/15/2020 09/15/2020  WBC 4.0 - 10.5 K/uL 8.9 - -   Hemoglobin 13.0 - 17.0 g/dL 14.0 13.3 14.3  Hematocrit 39.0 - 52.0 % 41.7 39.0 42.0  Platelets 150.0 - 400.0 K/uL 185.0 - -    Lab Results  Component Value Date/Time   VD25OH 21.65 (L) 12/11/2020 11:45 AM    Clinical ASCVD: No  The ASCVD Risk score Mikey Bussing DC Jr., et al., 2013) failed to calculate for the following reasons:   The valid total cholesterol range is 130 to 320 mg/dL    Depression screen [facility] 2/9 09/02/2020 05/29/2019 06/01/2018  Decreased Interest 0 0 0  Down, Depressed, Hopeless 1 0 0  PHQ - 2 Score 1 0 0  Some recent data might be hidden   No flowsheet data found.  CHA2DS2-VASc Score = 5  The patient's score is based upon: CHF History: Yes HTN History: Yes Diabetes History: Yes Stroke History: No Vascular Disease History: Yes      Social History   Tobacco Use  Smoking Status Former   Packs/day: 1.50   Years: 5.00   Pack years: 7.50   Types: Cigarettes   Quit date: 06/15/1994   Years since quitting: 26.5  Smokeless Tobacco Never   BP Readings from Last 3 Encounters:  12/11/20 136/70  09/16/20 118/80  09/02/20 140/86   Pulse Readings from Last 3 Encounters:  12/11/20 88  09/16/20 71  09/02/20 74   Wt Readings from Last 3 Encounters:  12/11/20 238 lb (108 kg)  09/15/20 252 lb (114.3 kg)  09/02/20 262 lb (118.8 kg)   BMI Readings from Last 3 Encounters:  12/11/20 28.97 kg/m  09/15/20 30.67 kg/m  09/02/20 31.07 kg/m    Assessment/Interventions: Review of patient past medical history, allergies, medications, health status, including review of consultants reports, laboratory and other test data, was performed as part of comprehensive evaluation and provision of chronic care management services.   SDOH:  (Social Determinants of Health) assessments and interventions performed: Yes  SDOH Screenings   Alcohol Screen: Low Risk    Last Alcohol Screening Score (AUDIT): 0  Depression (PHQ2-9): Low Risk    PHQ-2 Score: 1  Financial Resource  Strain: Low Risk    Difficulty of Paying Living Expenses: Not hard at all  Food Insecurity: No Food Insecurity   Worried About Charity fundraiser in the Last Year: Never true   Ran Out of Food in the Last Year: Never true  Housing: Low Risk    Last Housing Risk Score: 0  Physical Activity: Sufficiently Active   Days of Exercise per Week: 7 days   Minutes of Exercise per Session: 30 min  Social Connections: Not on file  Stress: Stress Concern Present   Feeling of Stress : To some extent  Tobacco Use: Medium Risk   Smoking Tobacco Use: Former   Smokeless Tobacco Use: Never  Transportation Needs: No Transportation Needs   Lack of Transportation (Medical): No   Lack of  Transportation (Non-Medical): No    CCM Care Plan  Allergies  Allergen Reactions   Quinolones Other (See Comments)    Patient was warned about not using Cipro and similar antibiotics. Recent studies have raised concern that fluoroquinolone antibiotics could be associated with an increased risk of aortic aneurysm Fluoroquinolones have non-antimicrobial properties that might jeopardise the integrity of the extracellular matrix of the vascular wall In a  propensity score matched cohort study in [place], there was a 66% increased rate of aortic aneurysm or dissection associated with oral fluoroquinolone use, compared wit    Medications Reviewed Today     Reviewed by Charlton Haws, [facility] (Pharmacist) on 12/23/20 at 68  Med List Status: <None>   Medication Order Taking? Sig Documenting Provider Last Dose Status Informant  alfuzosin (UROXATRAL) 10 MG 24 hr tablet [id number] Yes Take 1 tablet (10 mg total) by mouth daily with breakfast. [name], MD Taking Active Self  atorvastatin (LIPITOR) 20 MG tablet [id number] Yes TAKE 1 TABLET AT BEDTIME [name], MD Taking Active Self  citalopram (CELEXA) 20 MG tablet [id number] Yes Take 1 tablet (20 mg total) by mouth daily. Biagio Borg, MD Taking Active    Empagliflozin-metFORMIN HCl ER (SYNJARDY XR) 25-1000 MG TB24 [id number] Yes Take 1 tablet by mouth daily. Via [employer] pt assistance [provider] Taking Active Self           Med Note Luna Glasgow Dec 23, 2020 12:16 PM)    losartan (COZAAR) 25 MG tablet [id number] Yes TAKE 2 TABLETS EVERY DAY [name], [name], MD Taking Active Self  metoprolol tartrate (LOPRESSOR) 25 MG tablet [id number] Yes TAKE 1/2 TABLET TWICE DAILY [name], MD Taking Active   rivaroxaban (XARELTO) 20 MG TABS tablet [id number] Yes Take 1 tablet (20 mg total) by mouth daily with supper. [name], MD Taking Active   spironolactone (ALDACTONE) 25 MG tablet [id number] Yes TAKE 1/2 TABLET AT BEDTIME [name], MD Taking Active   thiamine 100 MG tablet [id number] Yes Take 1 tablet (100 mg total) by mouth daily. Shawna Clamp, MD Taking Active   triazolam (HALCION) 0.25 MG tablet [id number] Yes TAKE 1 TABLET (0.25 MG TOTAL) BY MOUTH AT BEDTIME AS NEEDED. FOR SLEEP [name], MD Taking Active   vitamin B-12 (CYANOCOBALAMIN) 1000 MCG tablet [id number] Yes Take 1 tablet (1,000 mcg total) by mouth daily.  Patient taking differently: Take 2,500 mcg by mouth daily.   Shawna Clamp, MD Taking Active             Patient Active Problem List   Diagnosis Date Noted   Anxiety and depression 12/11/2020   B12 deficiency 12/11/2020   History of stroke involving cerebellum 12/11/2020   Memory changes 12/11/2020   Balance problem 12/11/2020   Acute encephalopathy 09/15/2020   PSA elevation 09/08/2020   Left-sided chest pain 09/02/2020   DDD (degenerative disc disease), cervical 03/28/2020   DDD (degenerative disc disease), lumbar 03/28/2020   Syncope and collapse 03/20/2020   S/P ascending aortic replacement 10/16/2019   BRBPR (bright red blood per rectum) 09/06/2019   Mucopurulent chronic bronchitis ([place]) 05/30/2019   Benign prostatic hyperplasia without lower urinary tract symptoms  05/29/2019   BPH with elevated PSA 05/29/2019   Thoracic aortic aneurysm ([place]) 01/30/2019   TSH elevation 10/22/2018   Persistent atrial fibrillation ([place]) 06/01/2018   Claudication of both lower extremities ([place]) 07/15/2017   Chronic diastolic CHF (congestive heart failure),  NYHA class 2 ([place]) 07/06/2017   Mild tetrahydrocannabinol (THC) abuse 12/30/2016   Essential hypertension 12/24/2016   Hyperlipidemia LDL goal <130 05/11/2016   Type 2 diabetes mellitus with complication, without long-term current use of insulin ([place]) 05/11/2016   Routine general medical examination at a health care facility 10/06/2013   COLONIC POLYPS 05/28/2009   Obesity, morbid ([place]) 05/28/2009   ERECTILE DYSFUNCTION, 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   DEGENERATIVE JOINT DISEASE 05/28/2009   Insomnia w/ sleep apnea 05/28/2009    Immunization History  Administered Date(s) Administered   Fluad Quad(high Dose 65+) 03/22/2020   Influenza, High Dose Seasonal PF 03/10/2013, 02/26/2017, 02/22/2018   Influenza-Unspecified 04/08/2015, 02/25/2016, 02/09/2019   PFIZER(Purple Top)SARS-COV-2 Vaccination 08/18/2019, 09/13/2019, 03/29/2020   Pneumococcal Conjugate-13 01/26/2014   Pneumococcal Polysaccharide-23 04/25/2012, 07/15/2017   Tdap 07/22/2011   Zoster, Live 07/22/2011    Conditions to be addressed/monitored:  Hypertension, Hyperlipidemia, Diabetes, Atrial Fibrillation, Heart Failure, COPD, Depression, and Anxiety  Care Plan : [place]  Updates made by Charlton Haws, [place] since 12/23/2020 12:00 AM     Problem: Hypertension, Hyperlipidemia, Diabetes, Atrial Fibrillation, Heart Failure, COPD, Depression, and Anxiety   Priority: High     Long-Range Goal: Disease management   Start Date: 12/23/2020  Expected End Date: 03/25/2021  This Visit's Progress: On track  Priority: High  Note:   Current Barriers:  Unable to independently monitor therapeutic efficacy Unable to achieve control of  confusion/memory issues   Pharmacist Clinical Goal(s):  Patient will achieve adherence to monitoring guidelines and medication adherence to achieve therapeutic efficacy adhere to plan to optimize therapeutic regimen for confusion/memory issues as evidenced by report of adherence to recommended medication management changes through collaboration with PharmD and provider.   Interventions: 1:1 collaboration with [name], MD regarding development and update of comprehensive plan of care as evidenced by provider attestation and co-signature Inter-disciplinary care team collaboration (see longitudinal plan of care) Comprehensive medication review performed; medication list updated in electronic medical record  Heart Failure / Hypertension    HF Type: Diastolic Last ejection fraction: 55-60% (03/2020) NYHA Class: II (slight limitation of activity) BP goal is:  <140/90   Patient has failed these meds in past: furosemide Patient is currently controlled on the following medications: Metoprolol tartrate 25 mg - 1/2 tablet BID Spironolactone 25 mg - 1/2 tablet HS Losartan 25 mg - 2 tab daily   We discussed: pt reports compliance with medications; BP was at goal last week at PCP visit;  -he reports urine is the color of apple juice for past 24 hrs; he denies muscle aches, does endorse confusion, memory issues but this has been an issue for several months now; he reports he has been drinking a lot of apple juice lately; advised patient to switch to water and increase water intake overall as he may be dehyrdated -of note pt has lost 24 lbs since 09/02/20 (9% weight loss) unintentionally   Plan:  Continue current medications Stop apple juice and increase water intake   AFIB    Patient is currently rate controlled.   Patient has failed these meds in past: amiodarone Patient is currently controlled on the following medications: Metoprolol tartrate 25 mg - 1/2 tablet BID Xarelto 20 mg  daily   We discussed:  pt is getting Xarelto through [employer] for discounted price; he endorses compliance with medications and denies s/sx of bleeding   Plan: Continue current medications and control with diet and exercise   Hyperlipidemia  LDL goal < 70 Patient has failed these meds in past: n/a Patient is currently controlled on the following medications: Atorvastatin 20 mg daily   We discussed: pt reports compliance with statin; LDL is at goal as of last week;    Plan: Continue current medications and control with diet and exercise   Diabetes    A1c goal <7% Checking BG: Never    Patient has failed these meds in past: n/a Patient is currently controlled on the following medications: Synjardy XR 25-1000 mg daily - via [employer] PAP   We discussed: pt reports compliance with Synjardy; A1c is at goal   Plan: Continue current medications and control with diet and exercise    Chronic Bronchitis    Last spirometry score:  --10/13/2019: FEV1 81% predicted; FEV1/FVC 0.76 Gold Grade: Gold 1 (FEV1>80%) Current COPD Classification:  A (low sx, <2 exacerbations/yr)   Patient has failed these meds in past: n/a Patient is currently controlled on the following medications: Spiriva Respimat - using PRN   Using maintenance inhaler regularly? No Frequency of rescue inhaler use:  never   We discussed: Pt is short of breath on the phone today; he reports he gets short of breath when moving around a lot when after being stationary, or when he gets stressed/anxious; he feels that stress/anxiety is more the cause of SOB then underlying lung disease; still, pt would likely benefit from daily use of maintenance inhaler; he is not open to changes today as he is anxious about neurology appt later this week; will defer changes  -of note pt is already approved for [employer] pt assistance for [facility], he will also be able to get Spiriva through [place]: Continue to  monitor Consider re-starting daily LAMA once acute neurology issues improve   Anxiety/Stress  -Not ideally controlled, may be improving - pt reports he feels less anxious immediately after taking citalopram; this is likely placebo effect given timing after med and pt has only been on med for ~a week -stress may be contributing to memory issues/confusion -Current treatment: Citalopram 20 mg daily (started 12/11/20) -PHQ9: 1 (07/2020) -GAD7: not on file -Connected with PCP for mental health support -Educated on Benefits of medication for symptom control -Recommended to continue current medication  Health Maintenance  Health Maintenance -Vaccine gaps: Shingrix, covid booster (due 06/29/20) -Current therapy:  Thiamine 100 mg daily Vitamin B12 2500 mcg daily Vitamin B1 250 mg Vitamin D 2000 IU daily - not started -Advised pt to start Vitamin D 2000 IU daily as previously advised by PCP  Patient Goals/Self-Care Activities Patient will:  - take medications as prescribed focus on medication adherence by pill box check blood pressure daily, document, and provide at future appointments collaborate with provider on medication access solutions Iva Boop, Spiriva) -Keep Neurology appt 12/25/20 @ 9:30am      Medication Assistance:  Iva Boop - approved through [place] until 06/14/21  Compliance/Adherence/Medication fill history: Care Gaps: Shingrix Covid booster (due 06/29/20)  Star-Rating Drugs: Atorvastatin - LF 08/26/20 x 90 ds ([employer]) --(Pt reports he has received refill of atorvastatin in June) Losartan - LF 10/11/20 x 90 ds ([employer])  Patient's preferred pharmacy is:  [id number] [place] [place] [id number]: [phone]: [phone] [facility] [place] [address]: [phone]:  [phone] [facility]  HOME [place] [address] [address] Phone: [phone] Fax: [phone]  Uses pill box? Yes - sets up with his wife each week Pt endorses 100% compliance  We discussed: Current pharmacy is preferred with insurance plan and patient is satisfied with pharmacy services - pt is disadvantaged to use [facility]. Patient decided to: Continue current medication management strategy  Care Plan and Follow Up Patient Decision:  Patient agrees to Care Plan and Follow-up.  Plan: Telephone follow up appointment with care management team member scheduled for:  3 months  Charlene Brooke, PharmD, [place], CPP Clinical Pharmacist [facility] at [facility] [phone]

## 2020-12-24 NOTE — Patient Instructions (Addendum)
Visit Information  Phone number for Pharmacist: [phone]   Goals Addressed             This Visit's Progress    Manage My Medicine       Timeframe:  Long-Range Goal Priority:  High Start Date:     12/23/20                     Expected End Date:    03/25/21                   Follow Up Date Aug 2022   - call for medicine refill 2 or 3 days before it runs out - call if I am sick and can't take my medicine - keep a list of all the medicines I take; vitamins and herbals too - use a pillbox to sort medicine  -collaborate with provider on medication access solutions Iva Boop, Spiriva)   Why is this important?   These steps will help you keep on track with your medicines.   Notes:         Patient verbalizes understanding of instructions provided today and agrees to view in [place].  Telephone follow up appointment with pharmacy team member scheduled for: 3 months  Charlene Brooke, PharmD, [place], CPP Clinical Pharmacist [facility] at [facility] [phone]

## 2020-12-25 ENCOUNTER — Ambulatory Visit: Payer: Medicare HMO | Admitting: Physician Assistant

## 2020-12-25 ENCOUNTER — Encounter: Payer: Self-pay | Admitting: Physician Assistant

## 2020-12-25 ENCOUNTER — Other Ambulatory Visit: Payer: Self-pay

## 2020-12-25 VITALS — BP 104/68 | HR 81 | Ht 76.0 in | Wt 226.5 lb

## 2020-12-25 DIAGNOSIS — R413 Other amnesia: Secondary | ICD-10-CM

## 2020-12-25 DIAGNOSIS — Z8673 Personal history of transient ischemic attack (TIA), and cerebral infarction without residual deficits: Secondary | ICD-10-CM

## 2020-12-25 MED ORDER — MEMANTINE HCL 5 MG PO TABS: 5.0000 mg | ORAL_TABLET | Freq: Every day | ORAL | 11 refills | Status: DC

## 2020-12-25 NOTE — Progress Notes (Addendum)
Assessment/Plan:   Clinton Black is a 75 y.o. year old male with risk factors including  age, hypertension, hyperlipidemia, DM2, history of CVA involving the cerebellum, CDHF, Afib on AC, hypothyroidism, chronic bronchitis, sleep apnea, vit D deficiency, B12 deficiency, anxiety, depression and  seen today for evaluation of memory loss. MoCA today is 24/30 with deficiencies in visuospatial/executive, delayed recall  2/5 . MRI brain 09/16/20 showed Generalized age-related cerebral atrophy with mild chronic small vessel ischemic disease, with small remote right cerebellar infarct. THere is a component of depression, monitored by PCP    Recommendations:   Memory Loss with behavioral disturbance, apparently with rapid progression    Neurocognitive testing to further evaluated cognitive concerns, including contribution from sleep, stress, anxiety depression EEG to rule out seizures MRI brain to rule out structural abnormalities, neoplasm, stroke, bleeding or other abnormalities Start Memantine 5 mg tablet daily.  Side effects discussed PT for balance  Discussed safety both in and out of the home.  Recommend holding on driving Discussed the importance of regular daily schedule with inclusion of crossword puzzles to maintain brain function.  Continue to monitor mood  with Celexa, Halcion with PCP Stay active at least 30 minutes at least 3 times a week.  Naps should be scheduled and should be no longer than 60 minutes and should not occur after 2 PM.  Mediterranean diet is recommended  Folllow up once results above are available   Subjective:    The patient is seen in neurologic consultation at the request of Biagio Borg, MD for the evaluation of memory.  The patient is accompanied by best friend [name] who supplements the history.  The patient is a 75 y.o. year old male who has had memory issues for about 3 months.  In review, he was admitted from 4/3 through 09/16/2020 with acute  encephalopathy.  He had right facial droop from previous history of Bell's palsy.  His presentation was more consistent with encephalopathy rather than stroke, with no focal deficit on exam.  MRI at that time was negative.  B12 was low, and he was started on thiamine and B12, with good improvement of his levels.  The patient does partake marijuana, and he was instructed to discontinuing the use of it.  Follow-up with neurology was recommended. Since that time, his short-term memory appears to be worse according to his friend.  He cannot find his wallet, keys, glasses, although does not leave them in unusual places.  His long-term memory is good.  The patient lives with his wife, who has neuropathy herself, and has other medical issues, unable to care for him.  His friend dog is mostly involved in his care. For the last 3 months he has been experiencing increased depression, crying frequently throughout the day.  In the morning, his mood is better, but as the day progresses, he becomes more irritable, verbal, especially over the last 2 weeks.  She has been placed on Lexapro in late June, helping his mood to a certain extent.  His friend reports that he has hallucinations, seeing his dogs alive, and has increasingly vivid dreams, acting them out.  No sleepwalking was reported.  He is independent of bathing and dressing.  He misses several medications, for which his friend made a pillbox.  He forgets to pay bills.  He admits that over the last 2 months, he forgot to pay 2 of them.  He denies leaving objects in unusual places.   He continues  to drive, and has gotten lost several times over the last 2 weeks, one on [address] and [address], when he was disoriented, crying.  Last Sunday, the patient could not find his way home, got lost and went to [employer] to ask for help.  The staff kept him in the store while he was crying.  He also got lost on [facility], for about 7 hours, found in [employer], disoriented, crying and was taken home.  While in the car with his friend, they were heading to the dry cleaner, and he did not know where he was, "why they were going there".  His appetite is significantly decreased, having lost about 45 pounds in the last few months.  He is not drinking enough water either.  He denies trouble swallowing or excessive salivation or changes in his voice.  He is not interested in doing any activities, cannot comprehend what he is reading.  He does not cook, and denies leaving the stove or the faucet on.  He ambulates with difficulty, uses a cane on the right for stability.  "He wobbles a lot ".  He denies any falls or hitting his head.  He denies any double vision, dizziness, focal numbness or tingling.  He has left arm and leg cramps as well as intermittent tremors.  He denies any urine incontinence or retention with his tremors.  He denies any constipation or diarrhea.  Denies anosmia.  He denies any unilateral weakness, and his face continues to have mild droop.  He has a history of sleep apnea not on CPAP.  He quit 2 years ago the use of alcohol, he does not smoke.  He partakes marijuana.  Denies any family history of dementia. 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 Married 3 times, divorced. No children.  A1C 6/29 6.0 TSH 1.69 CBC and CMP unremarkable Vit D 21.65 (low) B12 461 Folate 8.2 nl B1 nl at 88.7  MRI brain wo contrast 09/15/20 1. No acute intracranial abnormality.2. Generalized age-related cerebral atrophy with mild chronic small vessel ischemic disease, with small remote right cerebellar infarct.   Allergies  Allergen Reactions   Quinolones Other (See Comments)    Patient was warned about not using Cipro and similar antibiotics. Recent studies have raised concern that fluoroquinolone antibiotics could be associated with an increased risk of aortic  aneurysm Fluoroquinolones have non-antimicrobial properties that might jeopardise the integrity of the extracellular matrix of the vascular wall In a  propensity score matched cohort study in [place], there was a 66% increased rate of aortic aneurysm or dissection associated with oral fluoroquinolone use, compared wit    Current Outpatient Medications  Medication Instructions   alfuzosin (UROXATRAL) 10 mg, Oral, Daily with breakfast   atorvastatin (LIPITOR) 20 MG tablet TAKE 1 TABLET AT BEDTIME   citalopram (CELEXA) 20 mg, Oral, Daily   Empagliflozin-metFORMIN HCl ER (SYNJARDY XR) 25-1000 MG TB24 1 tablet, Oral, Daily, Via [facility] pt assistance   losartan (COZAAR) 25 MG tablet TAKE 2 TABLETS EVERY DAY   memantine (NAMENDA) 5 mg, Oral, Daily at bedtime   metoprolol tartrate (LOPRESSOR) 25 MG tablet TAKE 1/2 TABLET TWICE DAILY   rivaroxaban (XARELTO) 20 mg, Oral, Daily with supper   spironolactone (ALDACTONE) 25 MG tablet TAKE 1/2 TABLET AT BEDTIME   thiamine 100 mg, Oral, Daily   triazolam (HALCION) 0.25 mg, Oral, At bedtime PRN, for sleep  vitamin B-12 (CYANOCOBALAMIN) 1,000 mcg, Oral, Daily     VITALS:   Vitals:   12/25/20 0922  BP: 104/68  Pulse: 81  Weight: 226 lb 8 oz (102.7 kg)  Height: 6\' 4"  (1.93 m)     HEENT:  Normocephalic, atraumatic. The mucous membranes are moist. The superficial temporal arteries are without ropiness or tenderness. Cardiovascular: Regular rate and rhythm. Lungs: Clear to auscultation bilaterally. Neck: There are no carotid bruits noted bilaterally.  NEUROLOGICAL:  Orientation:   Montreal Cognitive Assessment  12/25/2020  Visuospatial/ Executive (0/5) 3  Naming (0/3) 3  Attention: Read list of digits (0/2) 1  Attention: Read list of letters (0/1) 1  Attention: Serial 7 subtraction starting at 100 (0/3) 3  Language: Repeat phrase (0/2) 2  Language : Fluency (0/1) 1  Abstraction (0/2) 2  Delayed Recall (0/5) 2  Orientation (0/6) 5  Total  23  Adjusted Score (based on education) 23   Alert and oriented to person, place and time. No aphasia or dysarthria. Fund of knowledge is appropriate. Recent memory impaired and remote memory intact.  Attention slightly reduced and concentration are normal.  Able to name objects and repeat phrases. Delayed recall  2/5  Cranial nerves: Mild L facial droop (Bell's Palsy) . Extraocular muscles are intact and visual fields are full to confrontational testing. Speech is fluent and clear. Soft palate rises symmetrically and there is no tongue deviation. Hearing is intact to conversational tone. Tone: Tone is good throughout. No cogwheeling.  Sensation: Sensation is intact to light touch and pinprick throughout. Vibration is intact at the bilateral big toe.There is no extinction with double simultaneous stimulation. There is no sensory dermatomal level identified. Coordination: The patient has no difficulty with RAM's or FNF bilaterally. Normal finger to nose  Motor: Strength is 5/5 in the bilateral upper and lower extremities. There is no pronator drift. There are no fasciculations noted. DTR's: Deep tendon reflexes are 2/4 at the bilateral biceps, triceps, brachioradialis, patella and achilles.  Plantar responses are downgoing bilaterally. Gait and Station: The patient is able to ambulate with some difficulty, balance is impaired.The patient is unable  to heel toe walk without any difficulty.The patient is able to ambulate in a tandem fashion. The patient is unable to stand in the Romberg position.   CBC Latest Ref Rng & Units 12/11/2020 09/15/2020 09/15/2020  WBC 4.0 - 10.5 K/uL 8.9 - -  Hemoglobin 13.0 - 17.0 g/dL 14.0 13.3 14.3  Hematocrit 39.0 - 52.0 % 41.7 39.0 42.0  Platelets 150.0 - 400.0 K/uL 185.0 - -     CMP Latest Ref Rng & Units 12/11/2020 09/15/2020 09/15/2020  Glucose 70 - 99 mg/dL 101(H) - 91  BUN 6 - 23 mg/dL 23 - 24(H)  Creatinine 0.40 - 1.50 mg/dL 0.98 - 0.90  Sodium 135 - 145 mEq/L 139 141  140  Potassium 3.5 - 5.1 mEq/L 4.5 3.8 4.2  Chloride 96 - 112 mEq/L 104 - 109  CO2 19 - 32 mEq/L 24 - -  Calcium 8.4 - 10.5 mg/dL 9.9 - -  Total Protein 6.0 - 8.3 g/dL 6.7 - -  Total Bilirubin 0.2 - 1.2 mg/dL 0.8 - -  Alkaline Phos 39 - 117 U/L 98 - -  AST 0 - 37 U/L 10 - -  ALT 0 - 53 U/L 9 - -      Thank you for allowing [place] the opportunity to participate in the care of this nice patient. Please do not hesitate to  contact us for any questions or concerns.   Total time spent on today's visit was  60 minutes, including both face-to-face time and nonface-to-face time.  Time included that spent on review of records (prior notes available to me/labs/imaging if pertinent), discussing treatment and goals, answering patient's questions and coordinating care.  Cc:  [name], MD  Sharene Butters 12/25/2020 11:20 AM

## 2020-12-25 NOTE — Patient Instructions (Addendum)
It was a pleasure to see you today at our office.   Recommendations:  Neurocognitive evaluation at our office MRI of the brain, the office will call you to arrange you appointment PT for balance EEG to rule out seizure  Start Memantine 5 mg tablets at night.  Follow up once the results of the above are available   RECOMMENDATIONS FOR ALL PATIENTS WITH MEMORY PROBLEMS: 1. Continue to exercise (Recommend 30 minutes of walking everyday, or 3 hours every week) 2. Increase social interactions - continue going to [place] and enjoy social gatherings with friends and family 3. Eat healthy, avoid fried foods and eat more fruits and vegetables 4. Maintain adequate blood pressure, blood sugar, and blood cholesterol level. Reducing the risk of stroke and cardiovascular disease also helps promoting better memory. 5. Avoid stressful situations. Live a simple life and avoid aggravations. Organize your time and prepare for the next day in anticipation. 6. Sleep well, avoid any interruptions of sleep and avoid any distractions in the bedroom that may interfere with adequate sleep quality 7. Avoid sugar, avoid sweets as there is a strong link between excessive sugar intake, diabetes, and cognitive impairment We discussed the Mediterranean diet, which has been shown to help patients reduce the risk of progressive memory disorders and reduces cardiovascular risk. This includes eating fish, eat fruits and green leafy vegetables, nuts like almonds and hazelnuts, walnuts, and also use olive oil. Avoid fast foods and fried foods as much as possible. Avoid sweets and sugar as sugar use has been linked to worsening of memory function.  There is always a concern of gradual progression of memory problems. If this is the case, then we may need to adjust level of care according to patient needs. Support, both to the patient and caregiver, should then be put into place.      You have been referred for a neuropsychological  evaluation (i.e., evaluation of memory and thinking abilities). Please bring someone with you to this appointment if possible, as it is helpful for the doctor to hear from both you and another adult who knows you well. Please bring eyeglasses and hearing aids if you wear them.    The evaluation will take approximately 3 hours and has two parts:   The first part is a clinical interview with the neuropsychologist (Dr. Melvyn Novas or Dr. Nicole Kindred). During the interview, the neuropsychologist will speak with you and the individual you brought to the appointment.    The second part of the evaluation is testing with the doctor's technician Hinton Dyer or Maudie Mercury). During the testing, the technician will ask you to remember different types of material, solve problems, and answer some questionnaires. Your family member will not be present for this portion of the evaluation.   Please note: We must reserve several hours of the neuropsychologist's time and the psychometrician's time for your evaluation appointment. As such, there is a No-Show fee of $100. If you are unable to attend any of your appointments, please contact our office as soon as possible to reschedule.    FALL PRECAUTIONS: Be cautious when walking. Scan the area for obstacles that may increase the risk of trips and falls. When getting up in the mornings, sit up at the edge of the bed for a few minutes before getting out of bed. Consider elevating the bed at the head end to avoid drop of blood pressure when getting up. Walk always in a well-lit room (use night lights in the walls). Avoid area rugs or  power cords from appliances in the middle of the walkways. Use a walker or a cane if necessary and consider physical therapy for balance exercise. Get your eyesight checked regularly.  FINANCIAL OVERSIGHT: Supervision, especially oversight when making financial decisions or transactions is also recommended.  HOME SAFETY: Consider the safety of the kitchen when  operating appliances like stoves, microwave oven, and blender. Consider having supervision and share cooking responsibilities until no longer able to participate in those. Accidents with firearms and other hazards in the house should be identified and addressed as well.   ABILITY TO BE LEFT ALONE: If patient is unable to contact 911 operator, consider using LifeLine, or when the need is there, arrange for someone to stay with patients. Smoking is a fire hazard, consider supervision or cessation. Risk of wandering should be assessed by caregiver and if detected at any point, supervision and safe proof recommendations should be instituted.  MEDICATION SUPERVISION: Inability to self-administer medication needs to be constantly addressed. Implement a mechanism to ensure safe administration of the medications.   DRIVING: Regarding driving, in patients with progressive memory problems, driving will be impaired. We advise to have someone else do the driving if trouble finding directions or if minor accidents are reported. Independent driving assessment is available to determine safety of driving.   If you are interested in the driving assessment, you can contact the following:  [employer] in [place]  [place] [phone] or [phone]    [place] refers to food and lifestyle choices that are based on the traditions of countries located on the [employer]. This way of eating has been shown to help prevent certain conditions and improve outcomes for people who have chronic diseases, like kidney disease and heart disease. What are tips for following this plan? Lifestyle  Cook and eat meals together with your family, when possible. Drink enough fluid to keep your urine clear or pale yellow. Be physically active every day. This  includes: Aerobic exercise like running or swimming. Leisure activities like gardening, walking, or housework. Get 7-8 hours of sleep each night. If recommended by your health care provider, drink red wine in moderation. This means 1 glass a day for nonpregnant women and 2 glasses a day for men. A glass of wine equals 5 oz (150 mL). Reading food labels  Check the serving size of packaged foods. For foods such as rice and pasta, the serving size refers to the amount of cooked product, not dry. Check the total fat in packaged foods. Avoid foods that have saturated fat or trans fats. Check the ingredients list for added sugars, such as corn syrup. Shopping  At the grocery store, buy most of your food from the areas near the walls of the store. This includes: Fresh fruits and vegetables (produce). Grains, beans, nuts, and seeds. Some of these may be available in unpackaged forms or large amounts (in bulk). Fresh seafood. Poultry and eggs. Low-fat dairy products. Buy whole ingredients instead of prepackaged foods. Buy fresh fruits and vegetables in-season from local farmers markets. Buy frozen fruits and vegetables in resealable bags. If you do not have access to quality fresh seafood, buy precooked frozen shrimp or canned fish, such as tuna, salmon, or sardines. Buy small amounts of raw or cooked vegetables, salads, or olives from the deli or salad bar at your store. Stock your pantry so you always have certain foods on  hand, such as olive oil, canned tuna, canned tomatoes, rice, pasta, and beans. Cooking  Cook foods with extra-virgin olive oil instead of using butter or other vegetable oils. Have meat as a side dish, and have vegetables or grains as your main dish. This means having meat in small portions or adding small amounts of meat to foods like pasta or stew. Use beans or vegetables instead of meat in common dishes like chili or lasagna. Experiment with different cooking methods. Try  roasting or broiling vegetables instead of steaming or sauteing them. Add frozen vegetables to soups, stews, pasta, or rice. Add nuts or seeds for added healthy fat at each meal. You can add these to yogurt, salads, or vegetable dishes. Marinate fish or vegetables using olive oil, lemon juice, garlic, and fresh herbs. Meal planning  Plan to eat 1 vegetarian meal one day each week. Try to work up to 2 vegetarian meals, if possible. Eat seafood 2 or more times a week. Have healthy snacks readily available, such as: Vegetable sticks with hummus. Greek yogurt. Fruit and nut trail mix. Eat balanced meals throughout the week. This includes: Fruit: 2-3 servings a day Vegetables: 4-5 servings a day Low-fat dairy: 2 servings a day Fish, poultry, or lean meat: 1 serving a day Beans and legumes: 2 or more servings a week Nuts and seeds: 1-2 servings a day Whole grains: 6-8 servings a day Extra-virgin olive oil: 3-4 servings a day Limit red meat and sweets to only a few servings a month What are my food choices? Mediterranean diet Recommended Grains: Whole-grain pasta. Brown rice. Bulgar wheat. Polenta. Couscous. Whole-wheat bread. [place]. Vegetables: Artichokes. Beets. Broccoli. Cabbage. Carrots. Eggplant. Green beans. Chard. Kale. Spinach. Onions. Leeks. Peas. Squash. Tomatoes. Peppers. Radishes. Fruits: Apples. Apricots. Avocado. Berries. Bananas. Cherries. Dates. Figs. Grapes. Lemons. Melon. Oranges. Peaches. Plums. Pomegranate. Meats and other protein foods: Beans. Almonds. Sunflower seeds. Pine nuts. Peanuts. [place]. Salmon. Scallops. Shrimp. [place]. Tilapia. Clams. Oysters. Eggs. Dairy: Low-fat milk. Cheese. Greek yogurt. Beverages: Water. Red wine. Herbal tea. Fats and oils: Extra virgin olive oil. Avocado oil. Grape seed oil. Sweets and desserts: [place] yogurt with honey. Baked apples. Poached pears. Trail mix. Seasoning and other foods: Basil. Cilantro. Coriander. Cumin. Mint.  Parsley. Sage. Rosemary. Tarragon. Garlic. Oregano. Thyme. Pepper. Balsalmic vinegar. Tahini. Hummus. Tomato sauce. Olives. Mushrooms. Limit these Grains: Prepackaged pasta or rice dishes. Prepackaged cereal with added sugar. Vegetables: Deep fried potatoes (french fries). Fruits: Fruit canned in syrup. Meats and other protein foods: Beef. Pork. Lamb. Poultry with skin. Hot dogs. Berniece Salines. Dairy: Ice cream. Sour cream. Whole milk. Beverages: Juice. Sugar-sweetened soft drinks. Beer. Liquor and spirits. Fats and oils: Butter. Canola oil. Vegetable oil. Beef fat (tallow). Lard. Sweets and desserts: Cookies. Cakes. Pies. Candy. Seasoning and other foods: Mayonnaise. Premade sauces and marinades. The items listed may not be a complete list. Talk with your dietitian about what dietary choices are right for you. Summary The Mediterranean diet includes both food and lifestyle choices. Eat a variety of fresh fruits and vegetables, beans, nuts, seeds, and whole grains. Limit the amount of red meat and sweets that you eat. Talk with your health care provider about whether it is safe for you to drink red wine in moderation. This means 1 glass a day for nonpregnant women and 2 glasses a day for men. A glass of wine equals 5 oz (150 mL). This information is not intended to replace advice given to you by your health care provider. Make sure  you discuss any questions you have with your health care provider. Document Released: 01/23/2016 Document Revised: 02/25/2016 Document Reviewed: 01/23/2016 Elsevier Interactive Patient Education  2017 [employer].

## 2020-12-26 ENCOUNTER — Ambulatory Visit: Payer: Medicare HMO | Admitting: Neurology

## 2020-12-26 ENCOUNTER — Ambulatory Visit
Admission: RE | Admit: 2020-12-26 | Discharge: 2020-12-26 | Disposition: A | Discharge status: Home / Self Care | Payer: Medicare HMO | Source: Ambulatory Visit | Attending: Physician Assistant | Admitting: Physician Assistant

## 2020-12-26 DIAGNOSIS — Z8673 Personal history of transient ischemic attack (TIA), and cerebral infarction without residual deficits: Secondary | ICD-10-CM

## 2020-12-26 IMAGING — MR MR HEAD WO/W CM
12 series · 48 of 48 positions shown · IV contrast (multihance)
Comparison: [DATE]

CLINICAL DATA: History of cerebellar stroke

EXAM:
MRI HEAD WITHOUT AND WITH CONTRAST
TECHNIQUE: Multiplanar, multiecho pulse sequences of the brain and surrounding
structures were obtained without and with intravenous contrast.
CONTRAST:  20mL MULTIHANCE GADOBENATE DIMEGLUMINE 529 MG/ML IV SOLN

[Series 2: T1 · sagittal · 5.0mm · 0.45mm/px · 1 of 25 slices shown]
[im 1/25]
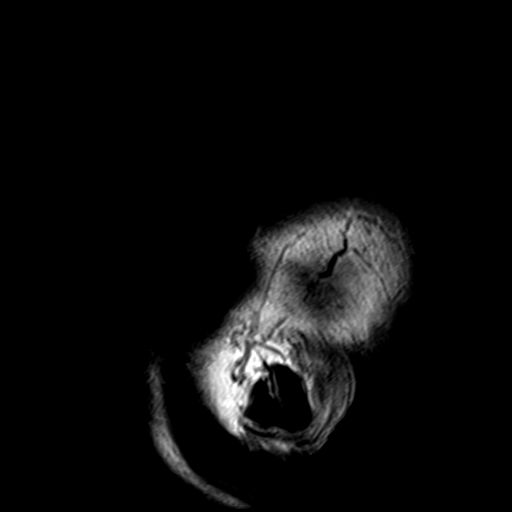

[Series 3: ax ep2d_diff_3 · axial · 3.0mm · 1.80mm/px · z∈[-11,+145]mm · 6 of 110 slices shown]
[im 1/110]
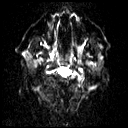
[im 22/110]
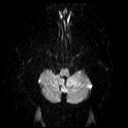
[im 44/110]
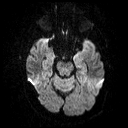
[im 66/110]
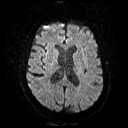
[im 88/110]
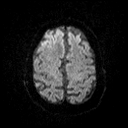
[im 110/110]
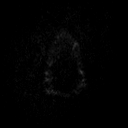

[Series 4: ax ep2d_diff_3_adc · axial · 3.0mm · 1.80mm/px · z∈[-11,+145]mm · 2 of 54 slices shown]
[im 1/54]
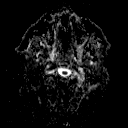
[im 54/54]
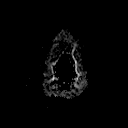

[Series 5: cor ep2d_diff · coronal · 5.0mm · 1.77mm/px · 4 of 60 slices shown]
[im 1/60]
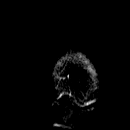
[im 20/60]
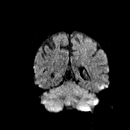
[im 40/60]
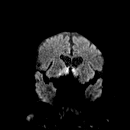
[im 60/60]
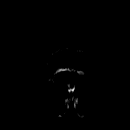

[Series 6: cor ep2d_diff_adc · coronal · 5.0mm · 1.77mm/px · 2 of 30 slices shown]
[im 1/30]
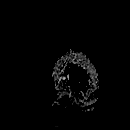
[im 30/30]
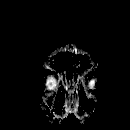

[Series 8: swi_images · axial · 2.0mm · 0.98mm/px · z∈[-6,+146]mm · 5 of 80 slices shown]
[im 1/80]
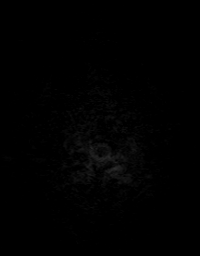
[im 20/80]
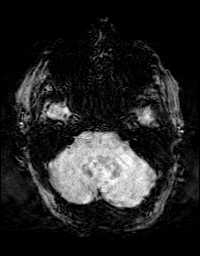
[im 40/80]
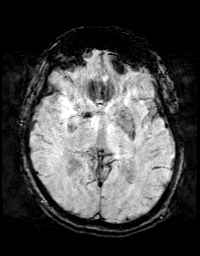
[im 60/80]
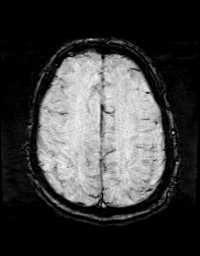
[im 80/80]
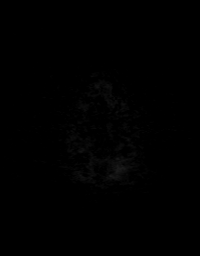

[Series 9: FLAIR · axial · 3.0mm · 0.43mm/px · z∈[-7,+139]mm · 2 of 40 slices shown]
[im 1/40]
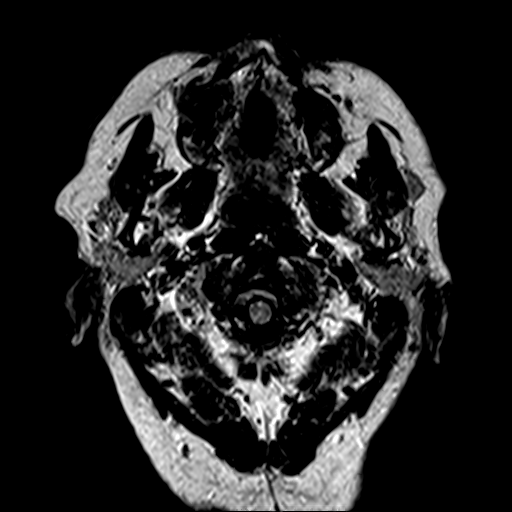
[im 40/40]
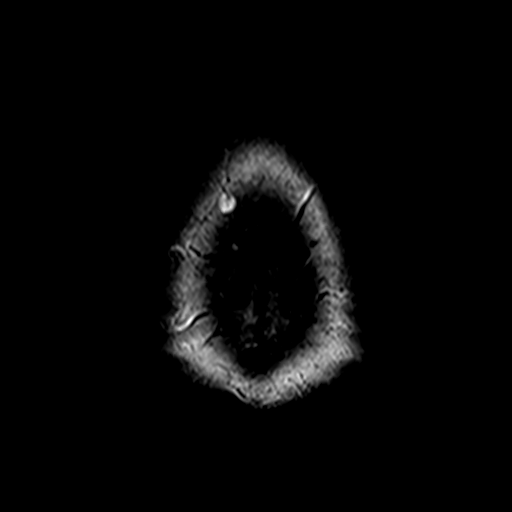

[Series 10: T2 · axial · 5.0mm · 0.65mm/px · z∈[-11,+151]mm · 2 of 29 slices shown (1 of 2)]
[im 1/29]
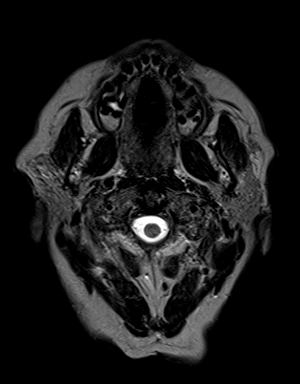
[im 29/29]
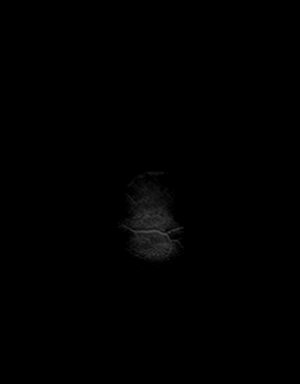

[Series 11: t1_mpr_tra · axial · 1.0mm · 0.72mm/px · z∈[-9,+144]mm · 10 of 160 slices shown]
[im 1/160]
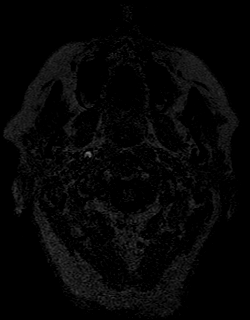
[im 18/160]
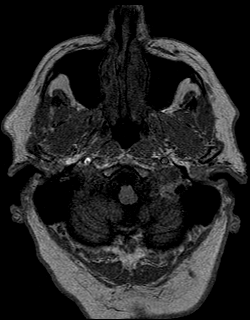
[im 36/160]
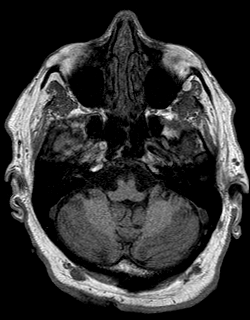
[im 54/160]
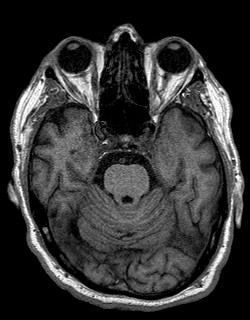
[im 71/160]
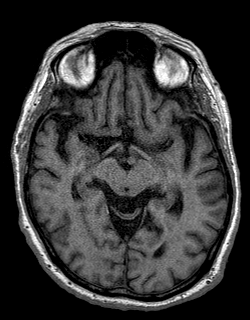
[im 89/160]
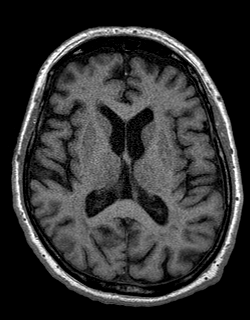
[im 107/160]
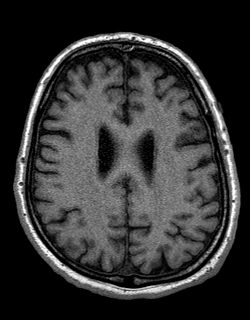
[im 124/160]
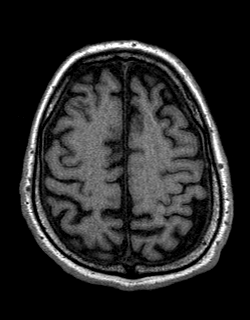
[im 142/160]
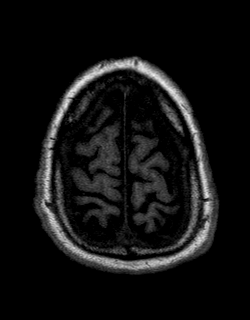
[im 160/160]
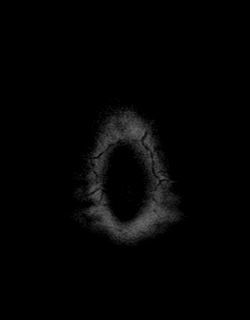

[Series 12: T2 · coronal · 5.0mm · 0.45mm/px · 2 of 31 slices shown (2 of 2)]
[im 1/31]
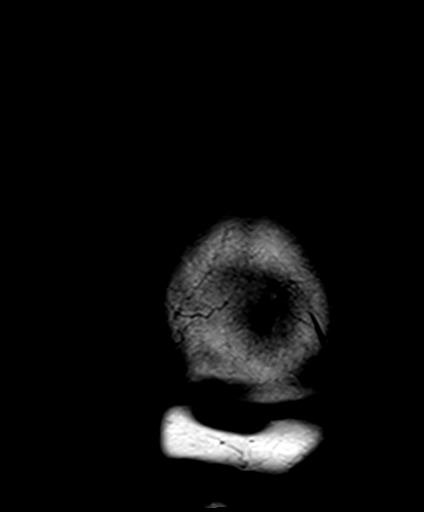
[im 31/31]
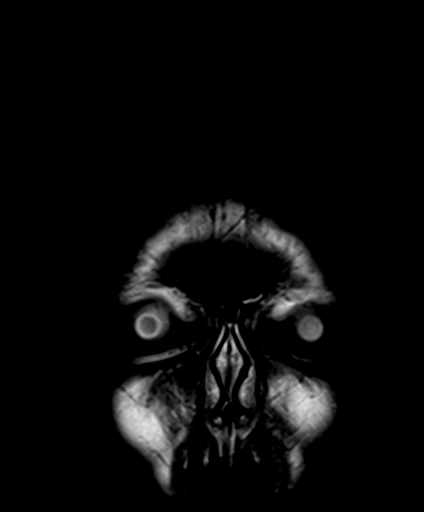

[Series 13: T1 post-contrast · coronal · 5.0mm · 0.72mm/px · 2 of 26 slices shown]
[im 1/26]
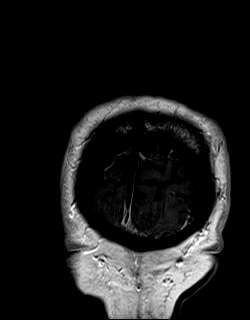
[im 26/26]
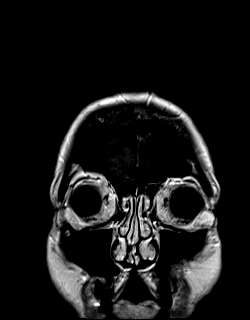

[Series 14: post t1_mpr_tra · axial · 1.0mm · 0.90mm/px · z∈[-51,+108]mm · 10 of 160 slices shown]
[im 1/160]
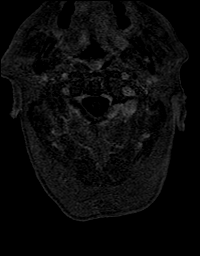
[im 18/160]
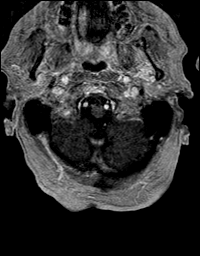
[im 36/160]
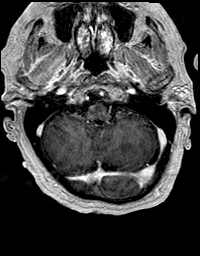
[im 54/160]
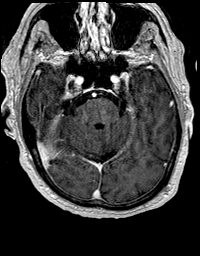
[im 71/160]
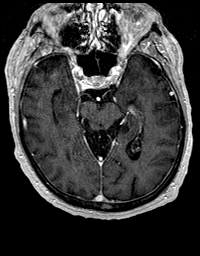
[im 89/160]
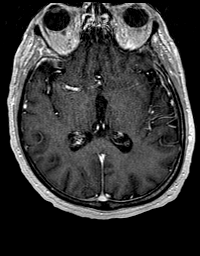
[im 107/160]
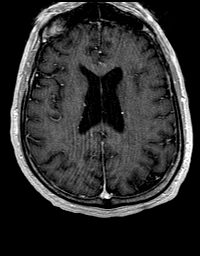
[im 124/160]
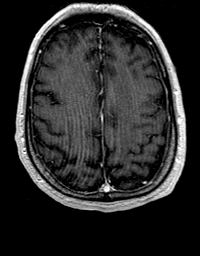
[im 142/160]
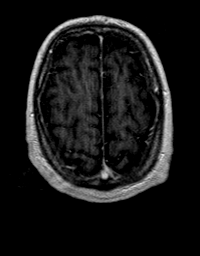
[im 160/160]
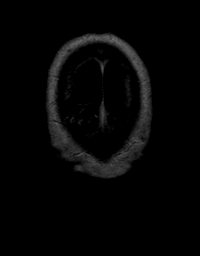

[48 of 48 positions shown; findings below may reference images not displayed]

FINDINGS: Brain: There is no acute infarction or intracranial hemorrhage.
There is no intracranial mass, mass effect, or edema. There is no
hydrocephalus or extra-axial fluid collection. Prominence of the
ventricles and sulci reflects generalized parenchymal volume loss
similar to the prior study. Patchy T2 hyperintensity in the
supratentorial white matter is nonspecific but probably reflects
stable mild chronic microvascular ischemic changes. There is a small
chronic right cerebellar infarct. No abnormal enhancement.

Vascular: Major vessel flow voids at the skull base are preserved.

Skull and upper cervical spine: Normal marrow signal is preserved.

Sinuses/Orbits: Minor mucosal thickening.  Orbits are unremarkable.

Other: Sella is unremarkable.  Mastoid air cells are clear.
IMPRESSION: No evidence of recent infarction, hemorrhage, or mass. No abnormal
enhancement.

Stable chronic microvascular ischemic changes. Small chronic right
cerebellar infarct. No substantial change since the prior study.

## 2020-12-26 MED ORDER — GADOBENATE DIMEGLUMINE 529 MG/ML IV SOLN
20.0000 mL | Freq: Once | INTRAVENOUS | Status: AC | PRN
  Administered 2020-12-26: 20 mL via INTRAVENOUS

## 2021-01-03 NOTE — Progress Notes (Signed)
Patient advised of MRI

## 2021-01-09 ENCOUNTER — Ambulatory Visit (INDEPENDENT_AMBULATORY_CARE_PROVIDER_SITE_OTHER): Payer: Medicare HMO | Admitting: Internal Medicine

## 2021-01-09 ENCOUNTER — Other Ambulatory Visit: Payer: Self-pay

## 2021-01-09 ENCOUNTER — Encounter: Payer: Self-pay | Admitting: Internal Medicine

## 2021-01-09 VITALS — BP 116/72 | HR 65 | Temp 98.0°F | Resp 16 | Ht 76.0 in | Wt 223.0 lb

## 2021-01-09 DIAGNOSIS — I63441 Cerebral infarction due to embolism of right cerebellar artery: Secondary | ICD-10-CM

## 2021-01-09 DIAGNOSIS — E118 Type 2 diabetes mellitus with unspecified complications: Secondary | ICD-10-CM

## 2021-01-09 DIAGNOSIS — G4730 Sleep apnea, unspecified: Secondary | ICD-10-CM

## 2021-01-09 DIAGNOSIS — G4700 Insomnia, unspecified: Secondary | ICD-10-CM

## 2021-01-09 MED ORDER — TRIAZOLAM 0.25 MG PO TABS: 0.2500 mg | ORAL_TABLET | Freq: Every evening | ORAL | 2 refills | Status: DC | PRN

## 2021-01-09 NOTE — Patient Instructions (Signed)

## 2021-01-09 NOTE — Progress Notes (Signed)
Subjective:  Patient ID: Clinton Black, male    DOB: February 07, 1946  Age: 75 y.o. MRN: [id number]  CC: Hypertension and Diabetes  This visit occurred during the SARS-CoV-2 public health emergency.  Safety protocols were in place, including screening questions prior to the visit, additional usage of staff PPE, and extensive cleaning of exam room while observing appropriate contact time as indicated for disinfecting solutions.    HPI ANNAN LEMARR presents for f/up -  He was recently seen in the ED for strokelike symptoms.  His MRI did not show any acute and arcs but it looks like he has microvascular changes.  He continues to complain of dizziness, lightheadedness, fatigue, insomnia, decrease in short-term memory, feeling off balance, and possibly sundowning.  He requests a refill of Halcion.   Outpatient Medications Prior to Visit  Medication Sig Dispense Refill   atorvastatin (LIPITOR) 20 MG tablet TAKE 1 TABLET AT BEDTIME 90 tablet 1   citalopram (CELEXA) 20 MG tablet Take 1 tablet (20 mg total) by mouth daily. 90 tablet 3   losartan (COZAAR) 25 MG tablet TAKE 2 TABLETS EVERY DAY 180 tablet 3   memantine (NAMENDA) 5 MG tablet Take 1 tablet (5 mg total) by mouth at bedtime. 30 tablet 11   metoprolol tartrate (LOPRESSOR) 25 MG tablet TAKE 1/2 TABLET TWICE DAILY 90 tablet 1   rivaroxaban (XARELTO) 20 MG TABS tablet Take 1 tablet (20 mg total) by mouth daily with supper. 90 tablet 1   spironolactone (ALDACTONE) 25 MG tablet TAKE 1/2 TABLET AT BEDTIME 45 tablet 1   thiamine 100 MG tablet Take 1 tablet (100 mg total) by mouth daily. 30 tablet 1   vitamin B-12 (CYANOCOBALAMIN) 1000 MCG tablet Take 1 tablet (1,000 mcg total) by mouth daily. (Patient taking differently: Take 2,500 mcg by mouth daily.) 30 tablet 1   alfuzosin (UROXATRAL) 10 MG 24 hr tablet Take 1 tablet (10 mg total) by mouth daily with breakfast. 90 tablet 1   Empagliflozin-metFORMIN HCl ER (SYNJARDY XR) 25-1000 MG TB24 Take 1  tablet by mouth daily. Via [employer] pt assistance     triazolam (HALCION) 0.25 MG tablet TAKE 1 TABLET (0.25 MG TOTAL) BY MOUTH AT BEDTIME AS NEEDED. FOR SLEEP 30 tablet 2   No facility-administered medications prior to visit.    ROS Review of Systems  Constitutional:  Negative for diaphoresis and fatigue.  HENT: Negative.    Eyes: Negative.  Negative for visual disturbance.  Respiratory:  Negative for cough, chest tightness, shortness of breath and wheezing.   Cardiovascular:  Negative for chest pain, palpitations and leg swelling.  Gastrointestinal:  Negative for abdominal pain, constipation, diarrhea, nausea and vomiting.  Endocrine: Negative for polydipsia, polyphagia and polyuria.  Genitourinary: Negative.  Negative for difficulty urinating.  Musculoskeletal:  Positive for gait problem. Negative for arthralgias.  Skin: Negative.   Neurological:  Positive for dizziness and light-headedness. Negative for numbness.  Hematological:  Negative for adenopathy. Does not bruise/bleed easily.  Psychiatric/Behavioral:  Positive for decreased concentration and sleep disturbance. Negative for confusion and suicidal ideas. The patient is not nervous/anxious.    Objective:  BP 116/72 (BP Location: Right Arm, Patient Position: Sitting, Cuff Size: Large)   Pulse 65   Temp 98 F (36.7 C) (Oral)   Resp 16   Ht '6\' 4"'$  (1.93 m)   Wt 223 lb (101.2 kg)   SpO2 98%   BMI 27.14 kg/m   BP Readings from Last 3 Encounters:  01/09/21 116/72  12/25/20 104/68  12/11/20 136/70    Wt Readings from Last 3 Encounters:  01/09/21 223 lb (101.2 kg)  12/25/20 226 lb 8 oz (102.7 kg)  12/11/20 238 lb (108 kg)    Physical Exam Vitals reviewed.  HENT:     Nose: Nose normal.     Mouth/Throat:     Mouth: Mucous membranes are moist.  Eyes:     Conjunctiva/sclera: Conjunctivae normal.  Cardiovascular:     Rate and Rhythm: Normal rate and regular rhythm.     Heart sounds: No murmur heard. Pulmonary:      Effort: Pulmonary effort is normal.     Breath sounds: No stridor. No wheezing, rhonchi or rales.  Abdominal:     General: Abdomen is flat.  Musculoskeletal:        General: Normal range of motion.     Cervical back: Neck supple.     Right lower leg: No edema.     Left lower leg: No edema.  Skin:    General: Skin is warm and dry.  Neurological:     General: No focal deficit present.     Motor: Weakness present.     Gait: Gait abnormal.  Psychiatric:        Mood and Affect: Mood normal.        Behavior: Behavior normal.    Lab Results  Component Value Date   WBC 8.9 12/11/2020   HGB 14.0 12/11/2020   HCT 41.7 12/11/2020   PLT 185.0 12/11/2020   GLUCOSE 101 (H) 12/11/2020   CHOL 98 12/11/2020   TRIG 111.0 12/11/2020   HDL 35.60 (L) 12/11/2020   LDLDIRECT 78.0 05/29/2019   LDLCALC 40 12/11/2020   ALT 9 12/11/2020   AST 10 12/11/2020   NA 139 12/11/2020   K 4.5 12/11/2020   CL 104 12/11/2020   CREATININE 0.98 12/11/2020   BUN 23 12/11/2020   CO2 24 12/11/2020   TSH 1.69 12/11/2020   PSA 4.43 (H) 12/11/2020   INR 1.4 (H) 09/15/2020   HGBA1C 6.0 12/11/2020   MICROALBUR 0.8 12/11/2020    MR BRAIN W WO CONTRAST  Result Date: 12/26/2020 CLINICAL DATA:  History of cerebellar stroke EXAM: MRI HEAD WITHOUT AND WITH CONTRAST TECHNIQUE: Multiplanar, multiecho pulse sequences of the brain and surrounding structures were obtained without and with intravenous contrast. CONTRAST:  58m MULTIHANCE GADOBENATE DIMEGLUMINE 529 MG/ML IV SOLN COMPARISON:  April 2022 FINDINGS: Brain: There is no acute infarction or intracranial hemorrhage. There is no intracranial mass, mass effect, or edema. There is no hydrocephalus or extra-axial fluid collection. Prominence of the ventricles and sulci reflects generalized parenchymal volume loss similar to the prior study. Patchy T2 hyperintensity in the supratentorial white matter is nonspecific but probably reflects stable mild chronic microvascular  ischemic changes. There is a small chronic right cerebellar infarct. No abnormal enhancement. Vascular: Major vessel flow voids at the skull base are preserved. Skull and upper cervical spine: Normal marrow signal is preserved. Sinuses/Orbits: Minor mucosal thickening.  Orbits are unremarkable. Other: Sella is unremarkable.  Mastoid air cells are clear. IMPRESSION: No evidence of recent infarction, hemorrhage, or mass. No abnormal enhancement. Stable chronic microvascular ischemic changes. Small chronic right cerebellar infarct. No substantial change since the prior study. Electronically Signed   By: PMacy MisM.D.   On: 12/26/2020 16:39    Assessment & Plan:   RJurellwas seen today for hypertension and diabetes.  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 for sleep  Type 2 diabetes mellitus with complication, without long-term current use of insulin ([place])- His recent A1c was 6.0% and he has some concerning symptoms so I recommended that he stop taking Synjardy.  I have discontinued Cathrine Muster. [place]augh's alfuzosin and Synjardy XR. I am also having him maintain his losartan, atorvastatin, thiamine, vitamin B-12, metoprolol tartrate, spironolactone, rivaroxaban, citalopram, memantine, and triazolam.  Meds ordered this encounter  Medications   triazolam (HALCION) 0.25 MG tablet    Sig: Take 1 tablet (0.25 mg total) by mouth at bedtime as needed. for sleep    Dispense:  30 tablet    Refill:  2    Not to exceed 5 additional fills before 03/01/2021     Follow-up: Return in about 4 months (around 05/12/2021).  Scarlette Calico, MD

## 2021-01-16 ENCOUNTER — Telehealth: Payer: Self-pay | Admitting: Internal Medicine

## 2021-01-16 DIAGNOSIS — I5032 Chronic diastolic (congestive) heart failure: Secondary | ICD-10-CM | POA: Diagnosis not present

## 2021-01-16 DIAGNOSIS — E1151 Type 2 diabetes mellitus with diabetic peripheral angiopathy without gangrene: Secondary | ICD-10-CM | POA: Diagnosis not present

## 2021-01-16 DIAGNOSIS — M5030 Other cervical disc degeneration, unspecified cervical region: Secondary | ICD-10-CM | POA: Diagnosis not present

## 2021-01-16 DIAGNOSIS — I4819 Other persistent atrial fibrillation: Secondary | ICD-10-CM | POA: Diagnosis not present

## 2021-01-16 DIAGNOSIS — M5136 Other intervertebral disc degeneration, lumbar region: Secondary | ICD-10-CM | POA: Diagnosis not present

## 2021-01-16 DIAGNOSIS — I110 Hypertensive heart disease with heart failure: Secondary | ICD-10-CM | POA: Diagnosis not present

## 2021-01-16 DIAGNOSIS — F32A Depression, unspecified: Secondary | ICD-10-CM | POA: Diagnosis not present

## 2021-01-16 DIAGNOSIS — F419 Anxiety disorder, unspecified: Secondary | ICD-10-CM | POA: Diagnosis not present

## 2021-01-16 DIAGNOSIS — I69354 Hemiplegia and hemiparesis following cerebral infarction affecting left non-dominant side: Secondary | ICD-10-CM | POA: Diagnosis not present

## 2021-01-16 NOTE — Telephone Encounter (Signed)
Verbal orders given  

## 2021-01-16 NOTE — Telephone Encounter (Signed)
Clinton Black  Once week for four weeks for medication and disease management  Therapist and social worker will call with plan of care when they do their evaluation

## 2021-01-17 ENCOUNTER — Telehealth: Payer: Self-pay

## 2021-01-17 ENCOUNTER — Telehealth: Payer: Self-pay | Admitting: Internal Medicine

## 2021-01-17 DIAGNOSIS — R413 Other amnesia: Secondary | ICD-10-CM

## 2021-01-17 DIAGNOSIS — E1151 Type 2 diabetes mellitus with diabetic peripheral angiopathy without gangrene: Secondary | ICD-10-CM | POA: Diagnosis not present

## 2021-01-17 DIAGNOSIS — I69354 Hemiplegia and hemiparesis following cerebral infarction affecting left non-dominant side: Secondary | ICD-10-CM | POA: Diagnosis not present

## 2021-01-17 DIAGNOSIS — Z8673 Personal history of transient ischemic attack (TIA), and cerebral infarction without residual deficits: Secondary | ICD-10-CM

## 2021-01-17 DIAGNOSIS — I4819 Other persistent atrial fibrillation: Secondary | ICD-10-CM | POA: Diagnosis not present

## 2021-01-17 DIAGNOSIS — F32A Depression, unspecified: Secondary | ICD-10-CM | POA: Diagnosis not present

## 2021-01-17 DIAGNOSIS — M5136 Other intervertebral disc degeneration, lumbar region: Secondary | ICD-10-CM | POA: Diagnosis not present

## 2021-01-17 DIAGNOSIS — I5032 Chronic diastolic (congestive) heart failure: Secondary | ICD-10-CM | POA: Diagnosis not present

## 2021-01-17 DIAGNOSIS — I110 Hypertensive heart disease with heart failure: Secondary | ICD-10-CM | POA: Diagnosis not present

## 2021-01-17 DIAGNOSIS — M5030 Other cervical disc degeneration, unspecified cervical region: Secondary | ICD-10-CM | POA: Diagnosis not present

## 2021-01-17 DIAGNOSIS — F419 Anxiety disorder, unspecified: Secondary | ICD-10-CM | POA: Diagnosis not present

## 2021-01-17 NOTE — Telephone Encounter (Signed)
Don with [facility]  Occupational Therapist [phone]  Heart rate of 51, 52, below 60  every 30 seconds extra beat or two, not quite a regular heartbeat, every 15 seconds an extra 1 or 2 beats  Needs provider recommendation

## 2021-01-17 NOTE — Telephone Encounter (Signed)
Pt has been double booked for 11am on 8/8

## 2021-01-17 NOTE — Telephone Encounter (Signed)
   Don from [facility] calling back for immediate triage regarding HR  Call transferred to [facility] with [facility] [employer]

## 2021-01-17 NOTE — Telephone Encounter (Signed)
I have had a verbal conversation with PCP. He stated for pt to stop Metoprolol over the weekend and to come in for a follow up OV Monday morning. Per PCP instruction pt is to also present the to ED over the weekend if he is feeling unwell, lightheaded or dizzy for evaluation. Pt, his wife and Timmothy Sours (OT) have been informed of instructions and expressed understanding.

## 2021-01-17 NOTE — Telephone Encounter (Signed)
-----   Message from Cameron Sprang, MD sent at 01/17/2021 11:07 AM EDT ----- Regarding: test results Not sure who got Clinton Black's result note for his MRI, but I don't see any phone messages. His friend Clinton Black is the main contact for him, can you pls let him know that the brain MRI did not show any new changes from his last can, no tumor, bleed, or new stroke. His brain wave test showed changes on the left side of his brain, will need a longer EEG to further evaluate this, can you pls let him know to schedule 24-hour EEG, pls order. Thank you!!

## 2021-01-17 NOTE — Procedures (Signed)
ELECTROENCEPHALOGRAM REPORT  Date of Study: 12/26/2020  Patient's Name: Clinton Black MRN: [id number] Date of Birth: 07-08-1945  Referring Provider: Sharene Butters, PA-C  Clinical History: This is a 75 year old man with rapidly progressive memory loss  Medications: Uroxatral Lipitor Celexa Synjardy Cozaar Namenda Metoprolol Xarelto Aldactone Thiamine B12  Technical Summary: A multichannel digital EEG recording measured by the international 10-20 system with electrodes applied with paste and impedances below 5000 ohms performed in our laboratory with EKG monitoring in an awake and asleep patient.  Hyperventilation was not performed. Photic stimulation was performed.  The digital EEG was referentially recorded, reformatted, and digitally filtered in a variety of bipolar and referential montages for optimal display.    Description: The patient is awake and asleep during the recording.  During maximal wakefulness, there is a symmetric, medium voltage 9-10 Hz posterior dominant rhythm that attenuates with eye opening.  There is occasional focal 4-5 Hz theta slowing over the left temporal region. During drowsiness and sleep, there is an increase in theta slowing of the background. Asymmetric vertex waves are seen, better formed over the right parasagittal derivations. Photic stimulation did not elicit any abnormalities.  There were no epileptiform discharges or electrographic seizures seen.    EKG lead was unremarkable.  Impression: This awake and asleep EEG is abnormal due to the presence of: Occasional focal slowing over the left temporal region Asymmetric vertex waves with suppression on the left central region  Clinical Correlation of the above findings indicates focal cerebral dysfunction over the left hemisphere suggestive of underlying structural or physiologic abnormality. The absence of epileptiform discharges does not exclude a clinical diagnosis of epilepsy. Clinical  correlation is advised.   Ellouise Newer, M.D.

## 2021-01-17 NOTE — Telephone Encounter (Signed)
MRI, but I don't see any phone messages. His friend Clinton Black is the main contact for him, can you pls let him know that the brain MRI did not show any new changes from his last can, no tumor, bleed, or new stroke. His brain wave test showed changes on the left side of his brain, will need a longer EEG to furtherevaluate this, can you pls let him know to schedule 24-hour EEG,

## 2021-01-20 ENCOUNTER — Ambulatory Visit (INDEPENDENT_AMBULATORY_CARE_PROVIDER_SITE_OTHER): Payer: Medicare HMO | Admitting: Internal Medicine

## 2021-01-20 ENCOUNTER — Encounter: Payer: Self-pay | Admitting: Internal Medicine

## 2021-01-20 ENCOUNTER — Other Ambulatory Visit: Payer: Self-pay

## 2021-01-20 VITALS — BP 134/70 | HR 52 | Temp 98.2°F | Ht 74.0 in | Wt 228.0 lb

## 2021-01-20 DIAGNOSIS — I480 Paroxysmal atrial fibrillation: Secondary | ICD-10-CM | POA: Diagnosis not present

## 2021-01-20 DIAGNOSIS — I10 Essential (primary) hypertension: Secondary | ICD-10-CM | POA: Diagnosis not present

## 2021-01-20 DIAGNOSIS — I4819 Other persistent atrial fibrillation: Secondary | ICD-10-CM

## 2021-01-20 NOTE — Patient Instructions (Signed)
Bradycardia, Adult Bradycardia is a slower-than-normal heartbeat. A normal resting heart rate for an adult ranges from 60 to 100 beats per minute. With bradycardia, the restingheart rate is less than 60 beats per minute. Bradycardia can prevent enough oxygen from reaching certain areas of your body when you are active. It can be serious if it keeps enough oxygen from reaching your brain and other parts of your body. Bradycardia is not a problem foreveryone. For some healthy adults, a slow resting heart rate is normal. What are the causes? This condition may be caused by: A problem with the heart, including: A problem with the heart's electrical system, such as a heart block. With a heart block, electrical signals between the chambers of the heart are partially or completely blocked, so they are not able to work as they should. A problem with the heart's natural pacemaker (sinus node). Heart disease. A heart attack. Heart damage. Lyme disease. A heart infection. A heart condition that is present at birth (congenital heart defect). Certain medicines that treat heart conditions. Certain conditions, such as hypothyroidism and obstructive sleep apnea. Problems with the balance of chemicals and other substances, like potassium, in the blood. Trauma. Radiation therapy. What increases the risk? You are more likely to develop this condition if you: Are age 65 or older. Have high blood pressure (hypertension), high cholesterol (hyperlipidemia), or diabetes. Drink heavily, use tobacco or nicotine products, or use drugs. What are the signs or symptoms? Symptoms of this condition include: Light-headedness. Feeling faint or fainting. Fatigue and weakness. Trouble with activity or exercise. Shortness of breath. Chest pain (angina). Drowsiness. Confusion. Dizziness. How is this diagnosed? This condition may be diagnosed based on: Your symptoms. Your medical history. A physical exam. During  the exam, your health care provider will listen to your heartbeat and check your pulse. To confirm the diagnosis, your health care provider may order tests, such as: Blood tests. An electrocardiogram (ECG). This test records the heart's electrical activity. The test can show how fast your heart is beating and whether the heartbeat is steady. A test in which you wear a portable device (event recorder or Holter monitor) to record your heart's electrical activity while you go about your day. An exercise test. How is this treated? Treatment for this condition depends on the cause of the condition and how severe your symptoms are. Treatment may involve: Treatment of the underlying condition. Changing your medicines or how much medicine you take. Having a small, battery-operated device called a pacemaker implanted under the skin. When bradycardia occurs, this device can be used to increase your heart rate and help your heart beat in a regular rhythm. Follow these instructions at home: Lifestyle  Manage any health conditions that contribute to bradycardia as told by your health care provider. Follow a heart-healthy diet. A nutrition specialist (dietitian) can help educate you about healthy food options and changes. Follow an exercise program that is approved by your health care provider. Maintain a healthy weight. Try to reduce or manage your stress, such as with yoga or meditation. If you need help reducing stress, ask your health care provider. Do not use any products that contain nicotine or tobacco, such as cigarettes, e-cigarettes, and chewing tobacco. If you need help quitting, ask your health care provider. Do not use illegal drugs. Limit alcohol intake to no more than 1 drink a day for nonpregnant women and 2 drinks a day for men. Be aware of how much alcohol is in your drink. In   the [place] one drink equals one 12 oz bottle of beer (355 mL), one 5 oz glass of wine (148 mL), or one 1 oz glass of  hard liquor (44 mL).  General instructions Take over-the-counter and prescription medicines only as told by your health care provider. Keep all follow-up visits as told by your health care provider. This is important. How is this prevented? In some cases, bradycardia may be prevented by: Treating underlying medical problems. Stopping behaviors or medicines that can trigger the condition. Contact a health care provider if you: Feel light-headed or dizzy. Almost faint. Feel weak or are easily fatigued during physical activity. Experience confusion or have memory problems. Get help right away if: You faint. You have: An irregular heartbeat (palpitations). Chest pain. Trouble breathing. Summary Bradycardia is a slower-than-normal heartbeat. With bradycardia, the resting heart rate is less than 60 beats per minute. Treatment for this condition depends on the cause. Manage any health conditions that contribute to bradycardia as told by your health care provider. Do not use any products that contain nicotine or tobacco, such as cigarettes, e-cigarettes, and chewing tobacco, and limit alcohol intake. Keep all follow-up visits as told by your health care provider. This is important. This information is not intended to replace advice given to you by your health care provider. Make sure you discuss any questions you have with your healthcare provider. Document Revised: 12/13/2017 Document Reviewed: 11/10/2017 Elsevier Patient Education  2022 [employer]  

## 2021-01-20 NOTE — Progress Notes (Signed)
Subjective:  Patient ID: Clinton Black, male    DOB: 05/24/1946  Age: 75 y.o. MRN: [id number]  CC: Atrial Fibrillation and Diabetes  This visit occurred during the SARS-CoV-2 public health emergency.  Safety protocols were in place, including screening questions prior to the visit, additional usage of staff PPE, and extensive cleaning of exam room while observing appropriate contact time as indicated for disinfecting solutions.    HPI Clinton Black presents for f/up-   I was contacted by one of his therapist 4 days ago about a heart rate into the 40s.  He complains of intermittent dizziness and lightheadedness with initiation of activity.  We asked him to hold metoprolol which he has done for the last 3 days.  His symptoms have improved somewhat.  Outpatient Medications Prior to Visit  Medication Sig Dispense Refill   atorvastatin (LIPITOR) 20 MG tablet TAKE 1 TABLET AT BEDTIME 90 tablet 1   citalopram (CELEXA) 20 MG tablet Take 1 tablet (20 mg total) by mouth daily. 90 tablet 3   losartan (COZAAR) 25 MG tablet TAKE 2 TABLETS EVERY DAY 180 tablet 3   memantine (NAMENDA) 5 MG tablet Take 1 tablet (5 mg total) by mouth at bedtime. 30 tablet 11   rivaroxaban (XARELTO) 20 MG TABS tablet Take 1 tablet (20 mg total) by mouth daily with supper. 90 tablet 1   spironolactone (ALDACTONE) 25 MG tablet TAKE 1/2 TABLET AT BEDTIME 45 tablet 1   thiamine 100 MG tablet Take 1 tablet (100 mg total) by mouth daily. 30 tablet 1   triazolam (HALCION) 0.25 MG tablet Take 1 tablet (0.25 mg total) by mouth at bedtime as needed. for sleep 30 tablet 2   vitamin B-12 (CYANOCOBALAMIN) 1000 MCG tablet Take 1 tablet (1,000 mcg total) by mouth daily. (Patient taking differently: Take 2,500 mcg by mouth daily.) 30 tablet 1   metoprolol tartrate (LOPRESSOR) 25 MG tablet TAKE 1/2 TABLET TWICE DAILY 90 tablet 1   No facility-administered medications prior to visit.    ROS Review of Systems  Constitutional:   Negative for diaphoresis and fatigue.  HENT: Negative.    Respiratory:  Negative for cough, chest tightness, shortness of breath and wheezing.   Cardiovascular:  Negative for chest pain, palpitations and leg swelling.  Gastrointestinal:  Negative for diarrhea and nausea.  Genitourinary: Negative.   Musculoskeletal:  Negative for arthralgias, myalgias and neck pain.  Neurological:  Positive for light-headedness. Negative for syncope and weakness.  Hematological:  Negative for adenopathy. Does not bruise/bleed easily.  Psychiatric/Behavioral: Negative.     Objective:  BP 134/70 (BP Location: Right Arm, Patient Position: Sitting, Cuff Size: Large)   Pulse (!) 52   Temp 98.2 F (36.8 C) (Oral)   Ht '6\' 2"'$  (1.88 m)   Wt 228 lb (103.4 kg)   SpO2 97%   BMI 29.27 kg/m   BP Readings from Last 3 Encounters:  01/20/21 134/70  01/09/21 116/72  12/25/20 104/68    Wt Readings from Last 3 Encounters:  01/20/21 228 lb (103.4 kg)  01/09/21 223 lb (101.2 kg)  12/25/20 226 lb 8 oz (102.7 kg)    Physical Exam Vitals reviewed.  HENT:     Nose: Nose normal.  Eyes:     Conjunctiva/sclera: Conjunctivae normal.  Cardiovascular:     Rate and Rhythm: Regular rhythm. Bradycardia present.     Heart sounds: Normal heart sounds, S1 normal and S2 normal. No murmur heard.    Comments: EKG-  Sinus bradycardia, 54 bpm Otherwise normal EKG Pulmonary:     Effort: Pulmonary effort is normal.     Breath sounds: No stridor. No wheezing, rhonchi or rales.  Abdominal:     General: Abdomen is flat.     Palpations: There is no mass.     Tenderness: There is no abdominal tenderness. There is no guarding.     Hernia: No hernia is present.  Musculoskeletal:        General: Normal range of motion.     Right lower leg: No edema.     Left lower leg: No edema.  Skin:    General: Skin is warm.  Neurological:     General: No focal deficit present.     Mental Status: He is alert.    Lab Results  Component  Value Date   WBC 8.9 12/11/2020   HGB 14.0 12/11/2020   HCT 41.7 12/11/2020   PLT 185.0 12/11/2020   GLUCOSE 101 (H) 12/11/2020   CHOL 98 12/11/2020   TRIG 111.0 12/11/2020   HDL 35.60 (L) 12/11/2020   LDLDIRECT 78.0 05/29/2019   LDLCALC 40 12/11/2020   ALT 9 12/11/2020   AST 10 12/11/2020   NA 139 12/11/2020   K 4.5 12/11/2020   CL 104 12/11/2020   CREATININE 0.98 12/11/2020   BUN 23 12/11/2020   CO2 24 12/11/2020   TSH 1.69 12/11/2020   PSA 4.43 (H) 12/11/2020   INR 1.4 (H) 09/15/2020   HGBA1C 6.0 12/11/2020   MICROALBUR 0.8 12/11/2020    MR BRAIN W WO CONTRAST  Result Date: 12/26/2020 CLINICAL DATA:  History of cerebellar stroke EXAM: MRI HEAD WITHOUT AND WITH CONTRAST TECHNIQUE: Multiplanar, multiecho pulse sequences of the brain and surrounding structures were obtained without and with intravenous contrast. CONTRAST:  28m MULTIHANCE GADOBENATE DIMEGLUMINE 529 MG/ML IV SOLN COMPARISON:  April 2022 FINDINGS: Brain: There is no acute infarction or intracranial hemorrhage. There is no intracranial mass, mass effect, or edema. There is no hydrocephalus or extra-axial fluid collection. Prominence of the ventricles and sulci reflects generalized parenchymal volume loss similar to the prior study. Patchy T2 hyperintensity in the supratentorial white matter is nonspecific but probably reflects stable mild chronic microvascular ischemic changes. There is a small chronic right cerebellar infarct. No abnormal enhancement. Vascular: Major vessel flow voids at the skull base are preserved. Skull and upper cervical spine: Normal marrow signal is preserved. Sinuses/Orbits: Minor mucosal thickening.  Orbits are unremarkable. Other: Sella is unremarkable.  Mastoid air cells are clear. IMPRESSION: No evidence of recent infarction, hemorrhage, or mass. No abnormal enhancement. Stable chronic microvascular ischemic changes. Small chronic right cerebellar infarct. No substantial change since the prior  study. Electronically Signed   By: PMacy MisM.D.   On: 12/26/2020 16:39   EEG adult  Result Date: 12/26/2020 ACameron Sprang MD     01/17/2021 11:04 AM ELECTROENCEPHALOGRAM REPORT Date of Study: 12/26/2020 Patient's Name: Clinton BAKALARMRN: [id number] of Birth: [birth year]Referring Provider: SSharene Butters PA-C Clinical History: This is a 75year old man with rapidly progressive memory loss Medications: Uroxatral Lipitor Celexa Synjardy Cozaar Namenda Metoprolol Xarelto Aldactone Thiamine B12 Technical Summary: A multichannel digital EEG recording measured by the international 10-20 system with electrodes applied with paste and impedances below 5000 ohms performed in our laboratory with EKG monitoring in an awake and asleep patient.  Hyperventilation was not performed. Photic stimulation was performed.  The digital EEG was referentially recorded, reformatted, and digitally filtered in  a variety of bipolar and referential montages for optimal display.  Description: The patient is awake and asleep during the recording.  During maximal wakefulness, there is a symmetric, medium voltage 9-10 Hz postserior dominant rhythm that attenuates with eye opening.  There is occasional focal 4-5 Hz theta slowing over the left temporal region. During drowsiness and sleep, there is an increase in theta slowing of the background. Asymmetric vertex waves are seen, better formed over the right parasagittal derivations. Photic stimulation did not elicit any abnormalities.  There were no epileptiform discharges or electrographic seizures seen.  EKG lead was unremarkable. Impression: This awake and asleep EEG is abnormal due to the presence of: Occasional focal slowing over the left temporal region Asymmetric vertex waves with suppression on the left central region Clinical Correlation of the above findings indicates focal cerebral dysfunction over the left hemisphere suggestive of underlying structural or physiologic  abnormality. The absence of epileptiform discharges does not exclude a clinical diagnosis of epilepsy. Clinical correlation is advised. Ellouise Newer, M.D.    Assessment & Plan:   Clinton Black was seen today for atrial fibrillation and diabetes.  Diagnoses and all orders for this visit:  PAF (paroxysmal atrial fibrillation) ([place])- He has good rate and rhythm control but is symptomatic with bradycardia.  Will discontinue metoprolol and will restart if needed. -     EKG 12-Lead  Essential hypertension- His blood pressure is adequately well controlled.  Persistent atrial fibrillation ([place])  I have discontinued Cathrine Muster. Rorrer's metoprolol tartrate. I am also having him maintain his losartan, atorvastatin, thiamine, vitamin B-12, spironolactone, rivaroxaban, citalopram, memantine, and triazolam.  No orders of the defined types were placed in this encounter.    Follow-up: Return in about 3 months (around 04/22/2021).  Scarlette Calico, MD

## 2021-01-21 ENCOUNTER — Telehealth: Payer: Self-pay | Admitting: Internal Medicine

## 2021-01-21 DIAGNOSIS — I5032 Chronic diastolic (congestive) heart failure: Secondary | ICD-10-CM | POA: Diagnosis not present

## 2021-01-21 DIAGNOSIS — F32A Depression, unspecified: Secondary | ICD-10-CM | POA: Diagnosis not present

## 2021-01-21 DIAGNOSIS — I69354 Hemiplegia and hemiparesis following cerebral infarction affecting left non-dominant side: Secondary | ICD-10-CM | POA: Diagnosis not present

## 2021-01-21 DIAGNOSIS — F419 Anxiety disorder, unspecified: Secondary | ICD-10-CM | POA: Diagnosis not present

## 2021-01-21 DIAGNOSIS — I4819 Other persistent atrial fibrillation: Secondary | ICD-10-CM | POA: Diagnosis not present

## 2021-01-21 DIAGNOSIS — I110 Hypertensive heart disease with heart failure: Secondary | ICD-10-CM | POA: Diagnosis not present

## 2021-01-21 DIAGNOSIS — M5030 Other cervical disc degeneration, unspecified cervical region: Secondary | ICD-10-CM | POA: Diagnosis not present

## 2021-01-21 DIAGNOSIS — E1151 Type 2 diabetes mellitus with diabetic peripheral angiopathy without gangrene: Secondary | ICD-10-CM | POA: Diagnosis not present

## 2021-01-21 DIAGNOSIS — M5136 Other intervertebral disc degeneration, lumbar region: Secondary | ICD-10-CM | POA: Diagnosis not present

## 2021-01-23 ENCOUNTER — Telehealth: Payer: Self-pay | Admitting: Internal Medicine

## 2021-01-23 NOTE — Telephone Encounter (Signed)
   FYI  Crystal from [employer] calling to report spouse has declined speech and nursing services to be set up

## 2021-01-23 NOTE — Telephone Encounter (Signed)
Noted  

## 2021-01-28 DIAGNOSIS — F32A Depression, unspecified: Secondary | ICD-10-CM | POA: Diagnosis not present

## 2021-01-28 DIAGNOSIS — F419 Anxiety disorder, unspecified: Secondary | ICD-10-CM | POA: Diagnosis not present

## 2021-01-28 DIAGNOSIS — I69354 Hemiplegia and hemiparesis following cerebral infarction affecting left non-dominant side: Secondary | ICD-10-CM | POA: Diagnosis not present

## 2021-01-28 DIAGNOSIS — I5032 Chronic diastolic (congestive) heart failure: Secondary | ICD-10-CM | POA: Diagnosis not present

## 2021-01-28 DIAGNOSIS — I4819 Other persistent atrial fibrillation: Secondary | ICD-10-CM | POA: Diagnosis not present

## 2021-01-28 DIAGNOSIS — M5136 Other intervertebral disc degeneration, lumbar region: Secondary | ICD-10-CM | POA: Diagnosis not present

## 2021-01-28 DIAGNOSIS — M5030 Other cervical disc degeneration, unspecified cervical region: Secondary | ICD-10-CM | POA: Diagnosis not present

## 2021-01-28 DIAGNOSIS — E1151 Type 2 diabetes mellitus with diabetic peripheral angiopathy without gangrene: Secondary | ICD-10-CM | POA: Diagnosis not present

## 2021-01-28 DIAGNOSIS — I110 Hypertensive heart disease with heart failure: Secondary | ICD-10-CM | POA: Diagnosis not present

## 2021-01-29 ENCOUNTER — Ambulatory Visit: Payer: Medicare HMO | Admitting: Neurology

## 2021-01-29 ENCOUNTER — Telehealth: Payer: Self-pay | Admitting: Pharmacist

## 2021-01-29 ENCOUNTER — Other Ambulatory Visit: Payer: Self-pay

## 2021-01-29 DIAGNOSIS — Z8673 Personal history of transient ischemic attack (TIA), and cerebral infarction without residual deficits: Secondary | ICD-10-CM

## 2021-01-29 DIAGNOSIS — R413 Other amnesia: Secondary | ICD-10-CM

## 2021-01-29 NOTE — Progress Notes (Signed)
    Chronic Care Management Pharmacy Assistant   Name: Clinton Black  MRN: [id number] DOB: April 18, 1946  Reason for Encounter: Disease State - General Adherence   Recent office visits:  01/20/21 Ronnald Ramp (PCP) - Atrial fib., Diabetes. D/c Metoprolol 25 mg. EKG done.  01/09/21 Ronnald Ramp (PCP) - Cerebral infarction. D/c Alfuzosin & Synjardy. Referral to Home Health.  Recent consult visits:  12/25/20 Virl Cagey (Neurology) - Memory loss. Start Memantine HCl 5 mg. Referral to Neuropsychology & Physical Therapy.   Hospital visits:  Medication Reconciliation was completed by comparing discharge summary, patient's EMR and Pharmacy list, and upon discussion with patient.  Admitted to the hospital on 09/15/20 due to Confusion. Discharge date was 09/16/20. Discharged from [facility]?Medications Started at [facility] Discharge:?? -START taking: thiamine vitamin B-12   Medications that remain the same after Hospital Discharge:??  -All other medications will remain the same.    Medications: Outpatient Encounter Medications as of 01/29/2021  Medication Sig   atorvastatin (LIPITOR) 20 MG tablet TAKE 1 TABLET AT BEDTIME   citalopram (CELEXA) 20 MG tablet Take 1 tablet (20 mg total) by mouth daily.   losartan (COZAAR) 25 MG tablet TAKE 2 TABLETS EVERY DAY   memantine (NAMENDA) 5 MG tablet Take 1 tablet (5 mg total) by mouth at bedtime.   rivaroxaban (XARELTO) 20 MG TABS tablet Take 1 tablet (20 mg total) by mouth daily with supper.   spironolactone (ALDACTONE) 25 MG tablet TAKE 1/2 TABLET AT BEDTIME   thiamine 100 MG tablet Take 1 tablet (100 mg total) by mouth daily.   triazolam (HALCION) 0.25 MG tablet Take 1 tablet (0.25 mg total) by mouth at bedtime as needed. for sleep   vitamin B-12 (CYANOCOBALAMIN) 1000 MCG tablet Take 1 tablet (1,000 mcg total) by mouth daily. (Patient taking differently: Take 2,500 mcg by mouth daily.)   No facility-administered encounter medications on file as of  01/29/2021.    Have you had any problems recently with your health? Patient states he still gets dizzy when standing up and memory loss is getting worst.  Have you had any problems with your pharmacy? Patient states no problems with pharmacy, he just recently re-filled all his meds.   What issues or side effects are you having with your medications? Patient states no side effects or issues.   What would you like me to pass along to [facility] for them to help you with?  Patient states he just came back from getting a heart monitor put on, he has to do an EEG for 24 hrs. He states he is drinking at least a 100 oz of water a day. He has started taking  the Vit D. And his anxiety is good as long as he don't anxious.  What can we do to take care of you better? Patient states he will call if he needs anything.  Star Rating Drugs: Atorvastatin - last fill 11/02/20 90D Losartan - last fill 12/23/20 [place], RMA Clinical Pharmacists Assistant [phone]  Time Spent: [phone]

## 2021-01-31 DIAGNOSIS — I69354 Hemiplegia and hemiparesis following cerebral infarction affecting left non-dominant side: Secondary | ICD-10-CM | POA: Diagnosis not present

## 2021-01-31 DIAGNOSIS — M5136 Other intervertebral disc degeneration, lumbar region: Secondary | ICD-10-CM | POA: Diagnosis not present

## 2021-01-31 DIAGNOSIS — E1151 Type 2 diabetes mellitus with diabetic peripheral angiopathy without gangrene: Secondary | ICD-10-CM | POA: Diagnosis not present

## 2021-01-31 DIAGNOSIS — F32A Depression, unspecified: Secondary | ICD-10-CM | POA: Diagnosis not present

## 2021-01-31 DIAGNOSIS — I110 Hypertensive heart disease with heart failure: Secondary | ICD-10-CM | POA: Diagnosis not present

## 2021-01-31 DIAGNOSIS — I4819 Other persistent atrial fibrillation: Secondary | ICD-10-CM | POA: Diagnosis not present

## 2021-01-31 DIAGNOSIS — F419 Anxiety disorder, unspecified: Secondary | ICD-10-CM | POA: Diagnosis not present

## 2021-01-31 DIAGNOSIS — M5030 Other cervical disc degeneration, unspecified cervical region: Secondary | ICD-10-CM | POA: Diagnosis not present

## 2021-01-31 DIAGNOSIS — I5032 Chronic diastolic (congestive) heart failure: Secondary | ICD-10-CM | POA: Diagnosis not present

## 2021-02-04 DIAGNOSIS — I110 Hypertensive heart disease with heart failure: Secondary | ICD-10-CM | POA: Diagnosis not present

## 2021-02-04 DIAGNOSIS — I4819 Other persistent atrial fibrillation: Secondary | ICD-10-CM | POA: Diagnosis not present

## 2021-02-04 DIAGNOSIS — F32A Depression, unspecified: Secondary | ICD-10-CM | POA: Diagnosis not present

## 2021-02-04 DIAGNOSIS — F419 Anxiety disorder, unspecified: Secondary | ICD-10-CM | POA: Diagnosis not present

## 2021-02-04 DIAGNOSIS — M5136 Other intervertebral disc degeneration, lumbar region: Secondary | ICD-10-CM | POA: Diagnosis not present

## 2021-02-04 DIAGNOSIS — E1151 Type 2 diabetes mellitus with diabetic peripheral angiopathy without gangrene: Secondary | ICD-10-CM | POA: Diagnosis not present

## 2021-02-04 DIAGNOSIS — I5032 Chronic diastolic (congestive) heart failure: Secondary | ICD-10-CM | POA: Diagnosis not present

## 2021-02-04 DIAGNOSIS — M5030 Other cervical disc degeneration, unspecified cervical region: Secondary | ICD-10-CM | POA: Diagnosis not present

## 2021-02-04 DIAGNOSIS — I69354 Hemiplegia and hemiparesis following cerebral infarction affecting left non-dominant side: Secondary | ICD-10-CM | POA: Diagnosis not present

## 2021-02-05 DIAGNOSIS — M5030 Other cervical disc degeneration, unspecified cervical region: Secondary | ICD-10-CM | POA: Diagnosis not present

## 2021-02-05 DIAGNOSIS — E1151 Type 2 diabetes mellitus with diabetic peripheral angiopathy without gangrene: Secondary | ICD-10-CM | POA: Diagnosis not present

## 2021-02-05 DIAGNOSIS — I4819 Other persistent atrial fibrillation: Secondary | ICD-10-CM | POA: Diagnosis not present

## 2021-02-05 DIAGNOSIS — F32A Depression, unspecified: Secondary | ICD-10-CM | POA: Diagnosis not present

## 2021-02-05 DIAGNOSIS — I69354 Hemiplegia and hemiparesis following cerebral infarction affecting left non-dominant side: Secondary | ICD-10-CM | POA: Diagnosis not present

## 2021-02-05 DIAGNOSIS — I5032 Chronic diastolic (congestive) heart failure: Secondary | ICD-10-CM | POA: Diagnosis not present

## 2021-02-05 DIAGNOSIS — M5136 Other intervertebral disc degeneration, lumbar region: Secondary | ICD-10-CM | POA: Diagnosis not present

## 2021-02-05 DIAGNOSIS — F419 Anxiety disorder, unspecified: Secondary | ICD-10-CM | POA: Diagnosis not present

## 2021-02-05 DIAGNOSIS — I110 Hypertensive heart disease with heart failure: Secondary | ICD-10-CM | POA: Diagnosis not present

## 2021-02-12 DIAGNOSIS — M5030 Other cervical disc degeneration, unspecified cervical region: Secondary | ICD-10-CM | POA: Diagnosis not present

## 2021-02-12 DIAGNOSIS — I4819 Other persistent atrial fibrillation: Secondary | ICD-10-CM | POA: Diagnosis not present

## 2021-02-12 DIAGNOSIS — F419 Anxiety disorder, unspecified: Secondary | ICD-10-CM | POA: Diagnosis not present

## 2021-02-12 DIAGNOSIS — F32A Depression, unspecified: Secondary | ICD-10-CM | POA: Diagnosis not present

## 2021-02-12 DIAGNOSIS — I5032 Chronic diastolic (congestive) heart failure: Secondary | ICD-10-CM | POA: Diagnosis not present

## 2021-02-12 DIAGNOSIS — E1151 Type 2 diabetes mellitus with diabetic peripheral angiopathy without gangrene: Secondary | ICD-10-CM | POA: Diagnosis not present

## 2021-02-12 DIAGNOSIS — I69354 Hemiplegia and hemiparesis following cerebral infarction affecting left non-dominant side: Secondary | ICD-10-CM | POA: Diagnosis not present

## 2021-02-12 DIAGNOSIS — I110 Hypertensive heart disease with heart failure: Secondary | ICD-10-CM | POA: Diagnosis not present

## 2021-02-12 DIAGNOSIS — M5136 Other intervertebral disc degeneration, lumbar region: Secondary | ICD-10-CM | POA: Diagnosis not present

## 2021-02-19 DIAGNOSIS — F32A Depression, unspecified: Secondary | ICD-10-CM | POA: Diagnosis not present

## 2021-02-19 DIAGNOSIS — I5032 Chronic diastolic (congestive) heart failure: Secondary | ICD-10-CM | POA: Diagnosis not present

## 2021-02-19 DIAGNOSIS — I69354 Hemiplegia and hemiparesis following cerebral infarction affecting left non-dominant side: Secondary | ICD-10-CM | POA: Diagnosis not present

## 2021-02-19 DIAGNOSIS — I110 Hypertensive heart disease with heart failure: Secondary | ICD-10-CM | POA: Diagnosis not present

## 2021-02-19 DIAGNOSIS — F419 Anxiety disorder, unspecified: Secondary | ICD-10-CM | POA: Diagnosis not present

## 2021-02-19 DIAGNOSIS — M5136 Other intervertebral disc degeneration, lumbar region: Secondary | ICD-10-CM | POA: Diagnosis not present

## 2021-02-19 DIAGNOSIS — M5030 Other cervical disc degeneration, unspecified cervical region: Secondary | ICD-10-CM | POA: Diagnosis not present

## 2021-02-19 DIAGNOSIS — E1151 Type 2 diabetes mellitus with diabetic peripheral angiopathy without gangrene: Secondary | ICD-10-CM | POA: Diagnosis not present

## 2021-02-19 DIAGNOSIS — I4819 Other persistent atrial fibrillation: Secondary | ICD-10-CM | POA: Diagnosis not present

## 2021-02-19 NOTE — Procedures (Signed)
ELECTROENCEPHALOGRAM REPORT  Dates of Recording: 01/29/2021 11:53AM to 01/30/2021 11:53AM  Patient's Name: Clinton Black MRN: [id number] Date of Birth: [birth year]  Referring Provider: Sharene Butters, PA-C  Procedure: 24-hour ambulatory video EEG  History: This is a 75 year old man with mental status changes, routine EEG showed left hemisphere changes (asymmetric vertex waves, left temporal slowing).  Medications:  Urotraxal Lipitor Celexa Synjardy Cozaar Memantine Metoprolol Aldactone Thiamine Vitamin B12  Technical Summary: This is a 24-hour multichannel digital video EEG recording measured by the international 10-20 system with electrodes applied with paste and impedances below 5000 ohms performed as portable with EKG monitoring.  The digital EEG was referentially recorded, reformatted, and digitally filtered in a variety of bipolar and referential montages for optimal display.    DESCRIPTION OF RECORDING: During maximal wakefulness, the background activity consisted of a symmetric 10 Hz posterior dominant rhythm which was reactive to eye opening.  There were no epileptiform discharges or focal slowing seen in wakefulness.  During the recording, the patient progresses through wakefulness, drowsiness, and Stage 2 sleep. There is rare focal 4-5 Hz theta slowing over the left temporal region. At times, there is shifting asymmetry seen with vertex waves, however majority of the time symmetric vertex waves are seen. Again, there were no epileptiform discharges seen.  Events: Patient did not complete symptom diary. Push button events were reviewed, no clinical changes seen. Electrographically, there were no EEG or EKG changes seen.   There were no electrographic seizures seen.  EKG lead was unremarkable.  IMPRESSION: This 24-hour ambulatory video EEG study is mildly abnormal due to rare focal slowing over the left temporal region.   CLINICAL CORRELATION of the above findings  indicates focal cerebral dysfunction over the left temporal region suggestive of underlying structural or physiologic abnormality. With prolonged EEG and sleep captured, vertex waves are overall symmetric. If further clinical questions remain, inpatient video EEG monitoring may be helpful.   Ellouise Newer, M.D.

## 2021-02-26 DIAGNOSIS — I110 Hypertensive heart disease with heart failure: Secondary | ICD-10-CM | POA: Diagnosis not present

## 2021-02-26 DIAGNOSIS — M5136 Other intervertebral disc degeneration, lumbar region: Secondary | ICD-10-CM | POA: Diagnosis not present

## 2021-02-26 DIAGNOSIS — I4819 Other persistent atrial fibrillation: Secondary | ICD-10-CM | POA: Diagnosis not present

## 2021-02-26 DIAGNOSIS — I69354 Hemiplegia and hemiparesis following cerebral infarction affecting left non-dominant side: Secondary | ICD-10-CM | POA: Diagnosis not present

## 2021-02-26 DIAGNOSIS — E1151 Type 2 diabetes mellitus with diabetic peripheral angiopathy without gangrene: Secondary | ICD-10-CM | POA: Diagnosis not present

## 2021-02-26 DIAGNOSIS — I5032 Chronic diastolic (congestive) heart failure: Secondary | ICD-10-CM | POA: Diagnosis not present

## 2021-02-26 DIAGNOSIS — F32A Depression, unspecified: Secondary | ICD-10-CM | POA: Diagnosis not present

## 2021-02-26 DIAGNOSIS — F419 Anxiety disorder, unspecified: Secondary | ICD-10-CM | POA: Diagnosis not present

## 2021-02-26 DIAGNOSIS — M5030 Other cervical disc degeneration, unspecified cervical region: Secondary | ICD-10-CM | POA: Diagnosis not present

## 2021-03-06 ENCOUNTER — Telehealth: Payer: Self-pay | Admitting: Internal Medicine

## 2021-03-06 NOTE — Telephone Encounter (Signed)
Mariann Laster says they have faxed over order # [id number] several times.Marland Kitchen last time being 09.14.22 for patient but have not received it back signed by provider  Please check if this has been done

## 2021-03-06 NOTE — Telephone Encounter (Signed)
Have provided Mariann Laster the [facility] P[facility] fax number. Order number in question was not received.

## 2021-03-06 NOTE — Telephone Encounter (Signed)
Order has been given to PCP to review and sign.

## 2021-03-06 NOTE — Telephone Encounter (Signed)
Order has been signed and faxed back

## 2021-03-18 ENCOUNTER — Telehealth: Payer: Self-pay | Admitting: Physician Assistant

## 2021-03-18 NOTE — Telephone Encounter (Signed)
The patient and his friend Marden Noble came in the office wanting to ask about a new medication. The name is Abucanumab - abuhelm. They are interested if the patient is a candidate to be able to take that. Marden Noble stated the patient seems to be progressing.

## 2021-03-18 NOTE — Telephone Encounter (Signed)
Caretaker advised and thanked me for calling.

## 2021-03-18 NOTE — Telephone Encounter (Signed)
Pls let them know that unfortunately he is not a candidate for it with the stage of his dementia. Aducanumab has been approved to be given in centers that do clinical trials or studies, it is for patients in the early stages, but right now there has been a 30% risk of brain bleed and swelling with Aducanumab, so patients need to be in a study if they are going to be candidates to take it.

## 2021-03-21 ENCOUNTER — Telehealth: Payer: Medicare HMO

## 2021-03-26 ENCOUNTER — Other Ambulatory Visit: Payer: Self-pay

## 2021-03-26 ENCOUNTER — Ambulatory Visit (INDEPENDENT_AMBULATORY_CARE_PROVIDER_SITE_OTHER): Payer: Medicare HMO | Admitting: Counselor

## 2021-03-26 ENCOUNTER — Encounter: Payer: Self-pay | Admitting: Counselor

## 2021-03-26 ENCOUNTER — Ambulatory Visit: Payer: Medicare HMO | Admitting: Psychology

## 2021-03-26 DIAGNOSIS — Z87898 Personal history of other specified conditions: Secondary | ICD-10-CM | POA: Diagnosis not present

## 2021-03-26 DIAGNOSIS — F09 Unspecified mental disorder due to known physiological condition: Secondary | ICD-10-CM

## 2021-03-26 DIAGNOSIS — G3184 Mild cognitive impairment, so stated: Secondary | ICD-10-CM | POA: Diagnosis not present

## 2021-03-26 NOTE — Progress Notes (Signed)
   Psychometrist Note   Cognitive testing was administered to [employer] by Milana Kidney, B.S. (Technician) under the supervision of Alphonzo Severance, Psy.D., ABN. Mr. Pezzullo was able to tolerate all test procedures. Dr. Nicole Kindred met with the patient as needed to manage any emotional reactions to the testing procedures. Rest breaks were offered.    The battery of tests administered was selected by Dr. Nicole Kindred with consideration to the patient's current level of functioning, the nature of his symptoms, emotional and behavioral responses during the interview, level of literacy, observed level of motivation/effort, and the nature of the referral question. This battery was communicated to the psychometrist. Communication between Dr. Nicole Kindred and the psychometrist was ongoing throughout the evaluation and Dr. Nicole Kindred was immediately accessible at all times. Dr. Nicole Kindred provided supervision to the technician on the date of this service, to the extent necessary to assure the quality of all services provided.    Mr. Meneely will return in approximately one week for an interactive feedback session with Dr. Nicole Kindred, at which time test performance, clinical impressions, and treatment recommendations will be reviewed in detail. The patient understands he can contact our office should he require our assistance before this time.   A total of 100 minutes of billable time were spent with [name] by the technician, including test administration and scoring time. Billing for these services is reflected in Dr. Les Pou note.   This note reflects time spent with the psychometrician and does not include test scores, clinical history, or any interpretations made by Dr. Nicole Kindred. The full report will follow in a separate note.

## 2021-03-26 NOTE — Progress Notes (Signed)
[facility]  Patient Name: Clinton Black MRN: [id number] Date of Birth: Oct 28, 1945 Age: 75 y.o. Education: 16 years  Referral Circumstances and Background Information  Clinton Black is a 75 y.o., right-hand dominant, divorced man with a history of HLD, HTN, DM2, cerebellar stroke, Afib (on Xarelto), vitamin D deficiency and vitamin B12 deficiency. As per chart review, the patient was in his normal state of health until April, 2022 at which point he presented to the hospital with encephalopathy after his partner noticed that he was confused. MRI of the brain was negative for any infarctions, he was started on thiamine and B12 and was held for observation for one day before being discharged to home. He consulted recently with Clinton Butters, PA-C who documented a MoCA of 24/30. As per a friend at that visit, he has been having hallucinations that his dead dog is alive and is acting out his dreams. He was referred for evaluation in the service of diagnostic clarification.   On interview, the patient is denying that he had any problems with memory and thinking prior to the hospitalization in April. He may have had some slight changes before then but "nothing abnormal." He did state that he had a stroke, although from review of records it seems like he in fact had a TIA/focal neuro deficits that were not found to be a stroke (as per MRI from 09/02/2018). The main symptom that he has noted were difficulties with navigation when he gets out of his immediate area (as documented in the chart, he got lost on [address] for several hours). He has a hard time in areas where he is unfamiliar. He also feels like he forgets conversations and feels like he has a hard time paying attention for more than a few minutes at a time. "I could stare at a television for 2 hours and not know what I was watching." He lives with a signfiicant other (an ex-wife) and she has noticed the  changes. I pointed out the discrepancy between his presentation, which was quite composed, and his report of symptoms. He said that "I can be like this and be ok until I am not." It was not clear whether his difficulties are static, fluctuate, or are worsening over time. It sounds like they can come and go. He no longer listens to books on tape, which he used to do, because he can't remember then. There is documentation that the patient was having increased difficulties with depression, agitation, and crying frequently although he is denying that now. He also denied that he is having hallucinations of dogs or that he is acting out his dreams. He describes his mood as normal or neutral. His energy is adequate but not like it was. He reported that he is sleeping adequately, about 10 hours. His appetite is ok, although he doesn't eat as much as he used to. He has lost 100lbs over the past 1.5 years.   With respect to functioning, the patient is having a hard time with driving. He was somewhat vague and said that "sometimes" he can get confused driving but not all the time. He reported that he generally does fine if he stays in familiar locations. His wife is starting to take over paying more bills because he has forgotten a few. Often times, all it takes is a reminder and he is able to do it. He still cooks, albeit not as much as he used to, and he has no  difficulties doing so. His wife was organizing his medications until 6 or 7 months ago, after the hospitalization, they got a pill minder and his wife lays it out now. She supervises his medication administration, he's not sure if he would remember if she didn't do it. The patient doesn't have much community utilization, although he will go out shopping occasionally. He does much better with routine and reported that he does well with that, but if he gets outside his routine, then he gets confused. He reported that he does use a smart phone and typically doesn't  have issues doing so, although he had an episode where he was having a hard time using his phone. 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 ([place]) 01/09/2021   Anxiety and depression 12/11/2020   B12 deficiency 12/11/2020   History of stroke involving cerebellum 12/11/2020   Balance problem 12/11/2020   PSA elevation 09/08/2020   DDD (degenerative disc disease), cervical 03/28/2020   DDD (degenerative disc disease), lumbar 03/28/2020   S/P ascending aortic replacement 10/16/2019   BRBPR (bright red blood per rectum) 09/06/2019   Mucopurulent chronic bronchitis ([place]) 05/30/2019   Benign prostatic hyperplasia without lower urinary tract symptoms 05/29/2019   BPH with elevated PSA 05/29/2019   Thoracic aortic aneurysm 01/30/2019   Persistent atrial fibrillation ([place]) 06/01/2018   Claudication of both lower extremities ([place]) 07/15/2017   Chronic diastolic CHF (congestive heart failure), NYHA class 2 ([place]) 07/06/2017   Mild tetrahydrocannabinol (THC) abuse 12/30/2016   Essential hypertension 12/24/2016   Hyperlipidemia LDL goal <130 05/11/2016   Type 2 diabetes mellitus with complication, without long-term current use of insulin ([place]) 05/11/2016   Routine general medical examination at a health care facility 10/06/2013   COLONIC POLYPS 05/28/2009   Obesity, morbid ([place]) 05/28/2009   ERECTILE DYSFUNCTION, 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   DEGENERATIVE JOINT DISEASE 05/28/2009   Insomnia w/ sleep apnea 05/28/2009   Review of Neuroimaging and Relevant Medical History: The patient denied any significant history of head injuries with loss of consciousness (though he did fall backward and hit his head on the stairs several years ago and went to the hospital but it doesn't sound like  he lost consciousness), he has no known history of clinically evident strokes. He has no neurological surgeries.   MRI of the brain from 12/26/2020 shows mild volume loss (not clearly exceeding involutional change) and mild areas of posterior-predominate leukoaraiosis. There is a very small right cerebellar infact that was noted by radiology. Otherwise unremarkable.   Current Outpatient Medications  Medication Sig Dispense Refill   atorvastatin (LIPITOR) 20 MG tablet TAKE 1 TABLET AT BEDTIME 90 tablet 1   citalopram (CELEXA) 20 MG tablet Take 1 tablet (20 mg total) by mouth daily. 90 tablet 3   losartan (COZAAR) 25 MG tablet TAKE 2 TABLETS EVERY DAY 180 tablet 3   memantine (NAMENDA) 5 MG tablet Take 1 tablet (5 mg total) by mouth at bedtime. 30 tablet 11   rivaroxaban (XARELTO) 20 MG TABS tablet Take 1 tablet (20 mg total) by mouth daily with supper. 90 tablet 1   spironolactone (ALDACTONE) 25 MG tablet TAKE 1/2 TABLET AT BEDTIME 45 tablet 1   thiamine 100 MG tablet Take 1 tablet (100 mg  total) by mouth daily. 30 tablet 1   triazolam (HALCION) 0.25 MG tablet Take 1 tablet (0.25 mg total) by mouth at bedtime as needed. for sleep 30 tablet 2   vitamin B-12 (CYANOCOBALAMIN) 1000 MCG tablet Take 1 tablet (1,000 mcg total) by mouth daily. (Patient taking differently: Take 2,500 mcg by mouth daily.) 30 tablet 1   No current facility-administered medications for this visit.   Family History  Problem Relation Age of Onset   Aneurysm Father    Heart disease Father    Varicose Veins Mother    Arthritis Mother    Macular degeneration Mother    Cancer Neg Hx    Colon cancer Neg Hx    Esophageal cancer Neg Hx    Rectal cancer Neg Hx    Stomach cancer Neg Hx    There is no  family history of dementia. He stated he is the only male to make it past 69 in his family. Other males have died from heart attacks, strokes and the like. There is no  family history of psychiatric illness.  Psychosocial  History  Developmental, Educational and Employment History: The patient was born in [place] and has moved around a lot. He reported that he was a good student who was never held back and had no learning difficulties. He went to private school. He graduated from [employer] with a [place] in communication and a BS in psychology. 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 He last worked full time around 75 but was working 35 hours a week until around 26. 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   Psychiatric History: The patient denied any significant psychiatric history.   Substance Use History: The patient stated that he quit drinking about 4 years ago (I believe he previously stated it was 2 years ago). He reported that he would work in the morning and then "come home and have a couple of good, stiff drinks, maybe 1/3 bottle of vodka." He smokes cannabis on a daily basis and has done so for a while. He estimated that he smokes 4-5 hits per day. He did not smoke today. He does not use other illicit substances.   Relationship History and Living Cimcumstances: The patient reported that he has been married and divorced 3 times. He is with his second wife (got back together but are not married), they have been together 7 years.   Mental Status and Behavioral Observations  Sensorium/Arousal: The patient's level of arousal was awake and alert. Hearing and vision were adequate for testing purposes. Orientation: The patient was fully oriented to person, place, time (although had to think if it was the 11th or 12th), and situation.  Appearance: Dressed in appropriate, casual clothing with reasonable grooming and hygiene.  Behavior: Pleasant and appropriate Speech/language: The patient's speech was normal in rate, rhythm, volume.  Gait/Posture: A bit unsteady and cautious, ambulated with single point cane.  Movement: No obvious  Parkinsonism or adventitious movements on exam Social Comportment: Pleasant, appropriate Mood: The patient denied any affective concerns Affect: Mainly euthymic Thought process/content: Thought process was logical, goal-oriented, and he presented as a fairly reasonable historian when cross referenced with objective information in the record. Thought content was appropriate.  Safety: Denied any thoughts of harming himself or others Insight: Child psychotherapist Achievement Test - 4  Word Reading Neuropsychological Assessment Battery  List Learning  Story Learning  Daily Living Memory  Naming  Digit Span Repeatable Battery for the Assessment of Neuropsychological Status (Form A)  Figure Copy  Judgment of Line Orientation  Coding  Figure Recall The Dot Counting Test A Random Letter Test Controlled Oral Word Association (F-A-S) Semantic Fluency (Animals) Trail Making Test A & B Complex Ideational Material Modified [place] Card Sorting Test Geriatric Depression Scale - Short Form  Plan  JOEY LIERMAN was seen for a psychiatric diagnostic evaluation and neuropsychological testing. He is a pleasant, 75 year old, right-hand dominant man who was in his normal state of health until an episode of encephalopathy in April, 2022. He was found to have thiamin and B12 deficiency, which were corrected. He feels like he has had intermittent difficulties since then with confusion while driving and the like. He says he has tried to simplify his life and if he gets anxious or overwhelmed, then he can get confused. It sounds like there is an episodic quality to his confusion. He presented as quite lucid today and did not seem to have notable difficulties providing a reasonable history and timeline. Full and complete note with impressions, recommendations, and interpretation of test data to follow.   Viviano Simas Nicole Kindred, PsyD, [place] Clinical Neuropsychologist  Informed Consent   Risks and benefits of the evaluation were discussed with the patient prior to all testing procedures. I conducted a clinical interview   with [name] and Milana Kidney, B.S. (Technician) administered test procedures. The patient was able to tolerate the testing procedures and the patient (and/or family if applicable) is likely to benefit from further follow up to receive the diagnosis and treatment recommendations, which will be rendered at the next encounter.

## 2021-03-27 ENCOUNTER — Encounter: Payer: Self-pay | Admitting: Counselor

## 2021-03-27 NOTE — Progress Notes (Signed)
[facility]  Patient Name: Clinton Black MRN: [id number] Date of Birth: Sep 21, 1945 Age: 75 y.o. Education: 16 years  Measurement properties of test scores: IQ, Index, and Standard Scores (SS): Mean = 100; Standard Deviation = 15 Scaled Scores (Ss): Mean = 10; Standard Deviation = 3 Z scores (Z): Mean = 0; Standard Deviation = 1 T scores (T); Mean = 50; Standard Deviation = 10  TEST SCORES:    Note: This summary of test scores accompanies the interpretive report and should not be interpreted by unqualified individuals or in isolation without reference to the report. Test scores are relative to age, gender, and educational history as available and appropriate.   Performance Validity        "A" Random Letter Test Raw  Descriptor      Errors 0 Within Expectation  The Dot Counting Test: 10 Within Expectation  NAB Effort Index 4 Below Expectation      Mental Status Screening     Total Score Descriptor  MoCA 24 Normal      Expected Functioning        Wide Range Achievement Test: Standard/Scaled Score Percentile      Word Reading 101 53      [place] Intellectual Screening Test Standard/T-score Percentile      Guess What 49 46      Odd Item Out 54 66  RIST Index 103 58      Attention/Processing Speed        Neuropsychological Assessment Battery (Attention Module, Form 1): T-score Percentile      Digits Forward 36 8      Digits Backwards 33 5      Repeatable Battery for the Assessment of Neuropsychological Status (Form A): Scaled Score Percentile      Coding 7 16      Language        Neuropsychological Assessment Battery (Language Module, Form 1): T-score Percentile      Naming   (31) 57 75      Verbal Fluency: T-score Percentile      Controlled Oral Word Association (F-A-S) 56 73      Semantic Fluency (Animals) 51 54      Memory:        Neuropsychological Assessment Battery (Memory Module, Form 1): T-score Percentile       List Learning           List A Immediate Recall   (5, 5, 7) 38 12         List B Immediate Recall   (4) 48 42         List A Short Delayed Recall   (0) 19 <1         List A Long Delayed Recall   (2) 28 2         List A Percent Retention   (0 %) --- <1         List A Long Delayed Yes/No Recognition Hits   (6) --- <1         List A Long Delayed Yes/No Recognition False Alarms   (9) --- 9         List A Recognition Discriminability Index --- <1      Story Learning           Immediate Recall   (13, 24) 31 3         Delayed Recall   (9) 30 2  Percent Retention   (38 %) --- 2      Daily Living Memory            Immediate Recall   (19, 13) 34 5          Delayed Recall   (6, 4) 36 8          Percent Retention (100 %) --- 84          Recognition Hits    (9) --- 62      Repeatable Battery for the Assessment of Neuropsychological Status (Form A): Scaled Score Percentile         Figure Recall   (10) 8 25      Visuospatial/Constructional Functioning        Repeatable Battery for the Assessment of Neuropsychological Status (Form A): Standard/Scaled Score Percentile     Visuospatial/Constructional Index 92 30         Figure Copy   (17) 9 37         Judgment of Line Orientation   (16) --- 26-50      Executive Functioning        Modified [employer] Test (MWCST): Standard/T-Score Percentile      Number of Categories Correct DC DC      Number of Perseverative Errors DC DC      Number of Total Errors DC DC      Percent Perseverative Errors DC DC  Executive Function Composite DC DC      Trail Making Test: T-Score Percentile      Part A 47 38      Part B 42 21      [place] Diagnostic Aphasia Exam: Raw Score Scaled Score      Complex Ideational Material 12 12      Rating Scales         Raw Score Descriptor  Geriatric Depression Scale - Short Form 6 Positive    V. Nicole Kindred PsyD, [place] Clinical Neuropsychologist

## 2021-03-28 NOTE — Progress Notes (Signed)
[facility]  Patient Name: Clinton Black MRN: [id number] Date of Birth: Aug 13, 1945 Age: 75 y.o. Education: 63 years  Clinical Impressions  Clinton Black is a 75 y.o., right-hand dominant, divorced man with a history of HTN, HLD, DM2, Afib (on Xarelto), Vitamin D and Vitamin B12 deficiency. He was in his normal state of health until April, 2022 at which point he was noted to be somewhat confused by his wife, presented to the hospital, and was found to be encephalopathic. During workup his nutritional deficiencies were found and were corrected and he was discharged to home after a day. It does not seem that a definitive cause for his AMS was determined. Since his discharge, he has noticed intermittent confusion. It is unclear if he is symptomatic all the time or just sometimes. He notices that he can get confused such as when he is driving, when he is in conversations where people are asking him multiple questions and when he is anxious, and he tries to avoid those situations. He presented as quite lucid during the clinical interview.   On neuropsychological testing, there is evidence of some cognitive impairment that can be summed up as primarily involving problems with attention, executive control, and processing efficiency, which is in keeping with the patient's report of day-to-day problems. His scores were weak on measures of attention and processing efficiency and he also demonstrated less than expected memory performance, mainly due to encoding issues. While his retention of information was inconsistent on verbally mediated measures, he did adequately on one measure and also on delayed recall for visual information. The possibility of a developing storage problem cannot be entirely excluded but I favor frontal-subcortical type problems attenuating memory functioning in the present case. He screened positive for the presence of depression. Performance on  language measures (including naming and semantic fluency) was intact.   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 My suspicion is that these difficulties in turn are affecting memory performance and that this is not a primary storage problem but of course that is also within the differential. I favor post-acute cognitive impairment from his episode of AMS as an etiology. So long as underlying medical causes have been ruled out, I would expect his difficulties to potentially improve with the passage of time. He should be reevaluated in one years time to follow his progress. Would also recommend optomizing health behaviors including sleep, diet, and exercise. It would be better if he could use sleep hygiene and other strategies as opposed to the benzodiazepine triazolam, because minimizing cognitively impairing medications could be helpful.   Diagnostic Impressions: Mild neurocognitive disorder History of delirium  Recommendations to be discussed with patient  Your performance and presentation on assessment was consistent with a mild level of cognitive difficulty. Your primary issues appear to be with respect to attention, working memory, and various facets of what I would call "executive control." Executive control is a higher order cognitive ability involved in regulating other cognitive resources. Much like the conductor of an orchestra coordinates multiple instruments to make music, executive capacities coordinate other lower-order skills (e.g., movement, language, attention) to form complex human behaviors. Individuals with executive control problems are often capable of doing most of the things they did before they were having problems, but they may not do so as effortlessly, efficiently, and consistently. These difficulties often manifest as problems tracking information, multitasking, and  paying attention. Executive  control problems often result in cognitive inefficiency and can present as "memory problems," because they decrease encoding and spontaneous retrieval of information.   The best diagnosis is mild cognitive impairment. In your case, I think that your mild cognitive impairment is likely due to the residua of your altered mental status. The most important thing is figuring out what caused your altered mental status and eliminating it or treating any underlying problems. Keep working on that with your medical doctors.  Often times, it can be helpful to optimize things such as health behaviors, sleep, making sure you are not ingesting brain impairing medications or substances and the like. I therefore think it is a good idea that you have stopped drinking alcohol. You may also wish to look into less sedating options to help you sleep, because Halcion is a benzodiazepine that can contribute to thinking and memory problems. Obviously smoking cannabis can also cause cognitive problems and that may be a factor as well.   I think that your condition may continue to improve as you are further out from your episode of altered mental status, so long as there are no underlying medical issues that are being missed. There is an outside chance that your condition is due to a progressive cause, however, and if that is the case then it will get worse (not better) over time. Altered mental status or what is known as "delirium "is sometimes a sign of an underlying condition, and it is also one of the biggest risk factors for developing an underlying condition. Nevertheless, I do not feel that Alzheimer's or other underlying similar progressive condition fit your presentation well at this time and would encourage you to simply do your best with health behaviors to minimize any risk. .  Think you should return to clinic within a year to monitor your progress.   There are few things as disruptive to  brain functioning as not getting a good night's sleep. For sleep, I recommend against using medications, which can have lingering sedating effects on the brain and rob your brain of restful REM sleep. Instead, consider trying some of the following sleep hygiene recommendations. They may not work at once and may take effort, but the effort you spend is likely to be rewarded with better sleep eventually:  Stick to a sleep schedule of the same bedtime and wake time even on the weekends, which can help to regulate your body's internal clock so that you fall asleep and stay asleep.  Practice a relaxing bedtime ritual (conducted away from bright lights) which will help separate your sleep from stimulating activities and prepare your body to fall asleep when you go to bed.  Avoid naps, especially in the afternoon.  Evaluate your room and create conditions that will promote sleep such as keeping it cool (between 60 - 67 degrees), quiet, and free from any lights. Consider using blackout curtains, a "white noise" generator, or fan that will help mask any noises that might prevent you from going to sleep or awaken you during the night.  Sleep on a comfortable mattress and pillows.  Avoid bright light in the evening and excessive use of portable electronic devices right before bed that may contain light frequencies that can contribute to sleep problems.  Avoid alcohol, cigarettes, or heavy meals in the evening. If you must eat, consume a light snack 45 minutes before bed.  Use your bed only for sleep to strengthen the association between your bed and sleep.  If you  can't go to sleep within 30 minutes, go into another room and do something relaxing until you feel tired. Then, come back and try to go to sleep again for 30 minutes and repeat until sleep is achieved.  Some people find over the counter melatonin to be helpful for sleep, which you could discuss with a pharmacist or prescribing provider.   Staying active  mentally is crucially important. This can include formal types of brain training, such as cognitive rehabilitation, sudoku, crossword puzzles and the like, although the best thing is to stay active and engaged in your life. This includes having meaningful hobbies and interests that you pursue and especially cultivating satisfying social relationships, which are a form of cognitive stimulation and have been shown to be protective in and of themselves.    Test Findings  Test scores are summarized in additional documentation associated with this encounter. Test scores are relative to age, gender, and educational history as available and appropriate. There were no concerns about performance validity as all findings fell within normal expectations. He scored below expectations on embedded measure, although this is likely due to a bona fide cognitive difficulties.  General Intellectual Functioning/Achievement:  Performance on single word reading was average. Performance on the RIST index was also average. Average presents as a reasonable standard of comparison for this patient's cognitive test performance.  Attention and Processing Efficiency: Performance was weak on measures of attention and working memory, with unusually low scores on digit repetition forward and digit repetition backward. Timed number symbol coding was low average. This is a processing speed measure but does have selective attention and graphomotor association learning task demands.  Language: Visual object confrontation naming was within normal limits. Generation of words in response to phonemic and category prompts was average.  Visuospatial Function: Performance on visuospatial and constructional indicators was good, with an average scale on the relevant armbands index. Figure copy was average. Judgment of angular line orientations was also average.  Learning and Memory: Performance on measures of learning and memory was below  expectation. The profile is mixed, but shows difficulties with encoding on most verbal measures and then difficulties with retention of information on more challenging measures. The pattern is not consistent with a clear storage problem, however, because he retained visual information well and also did well with retention briefer verbal information. It is therefore my opinion that difficulties with attention and executive control task demands are undermining his performance.  In the verbal realm, immediate recall for 12-item word list was low average with 5, 5, and 7 words across 3 learning trials. He did not recall any words on short delayed recall, an indication of retroactive interference given that he was read to distractor alternate word list in between immediate and short delay recall trials, but he retained 2 words on long delayed recall. This is still extremely low. Recognition cueing did not substantially increase his performance, with an extremely low discriminability index. Memory for short story showed unusually low immediate recall and delayed recall with extremely low retention. Memory for daily living information was unusually low on immediate and delayed recall, although retention of this information was good and he performed an average level on delayed recognition.  In the visual realm, delayed recall for a modestly complex figure was average.  Executive Functions: Performance on executive indicators was actually fairly good, although I do think that he has executive control problems, showing on measures of attention and memory. Generation of words in response to the letters  F-A-S was average. Alternating sequencing numbers and letters of the alphabet was low average. Reasoning with complex verbal information was average. He got frustrated on a card sorting test, and discontinued it, making it difficult to interpret his performance.  Rating Scale(s): Clinton Black screened positive for the  presence of depression.  Viviano Simas Nicole Kindred, PsyD, ABN Clinical Neuropsychologist  Coding and Compliance  Billing below reflects technician time, my direct face-to-face time with the patient, time spent in test administration, and time spent in professional activities including but not limited to: neuropsychological test interpretation, integration of neuropsychological test data with clinical history, report preparation, treatment planning, care coordination, and review of diagnostically pertinent medical history or studies.   Services associated with this encounter: Clinical Interview [phone]) plus 160 minutes (96132/[id number]; Neuropsychological Evaluation by Professional)  100 minutes (96138/96139; Neuropsychological Testing by Technician)

## 2021-03-31 ENCOUNTER — Other Ambulatory Visit: Payer: Self-pay | Admitting: Internal Medicine

## 2021-03-31 DIAGNOSIS — E785 Hyperlipidemia, unspecified: Secondary | ICD-10-CM

## 2021-03-31 DIAGNOSIS — E118 Type 2 diabetes mellitus with unspecified complications: Secondary | ICD-10-CM

## 2021-04-01 ENCOUNTER — Encounter: Payer: Medicare HMO | Admitting: Counselor

## 2021-04-08 ENCOUNTER — Telehealth (INDEPENDENT_AMBULATORY_CARE_PROVIDER_SITE_OTHER): Payer: Medicare HMO | Admitting: Counselor

## 2021-04-08 ENCOUNTER — Encounter: Payer: Self-pay | Admitting: Counselor

## 2021-04-08 ENCOUNTER — Other Ambulatory Visit: Payer: Self-pay

## 2021-04-08 DIAGNOSIS — G3184 Mild cognitive impairment, so stated: Secondary | ICD-10-CM | POA: Diagnosis not present

## 2021-04-08 NOTE — Progress Notes (Signed)
 NEUROPSYCHOLOGY FEEDBACK NOTE [facility]  Feedback Note: I met with Clinton Black to review the findings resulting from his neuropsychological evaluation. He was in fact not feeling well so I spoke with his wife Clinton Black. Since the last appointment, he has been about the same. He has good days and bad days. His wife confirmed the history that he presented, with significant fluctuation in his cognitive status from one day to the next. It almost sounds as though he is having recurrent episodes of AMS. Time was spent reviewing the impressions and recommendations that are detailed in the evaluation report. I discussed with them the importance of things such as maintaining optimal diet, hydration, regular sleep schedule, and avoiding intoxicating substances. Provided psychoeducation on causes of AMS, frequent relation with underlying dementia, and the fact that I am not convinced that he has an underlying cause for cognitive decline (like AD) but that it cannot be entirely ruled out. We discussed the importance of sharing positive time together, as reflected in the patient instructions. I took time to explain the findings and answer all the patient's questions. I encouraged Clinton Black to contact me should he have any further questions or if further follow up is desired.   Current Medications and Medical History   Current Outpatient Medications  Medication Sig Dispense Refill   atorvastatin (LIPITOR) 20 MG tablet TAKE 1 TABLET AT BEDTIME 90 tablet 1   citalopram (CELEXA) 20 MG tablet Take 1 tablet (20 mg total) by mouth daily. 90 tablet 3   losartan (COZAAR) 25 MG tablet TAKE 2 TABLETS EVERY DAY 180 tablet 3   memantine (NAMENDA) 5 MG tablet Take 1 tablet (5 mg total) by mouth at bedtime. 30 tablet 11   rivaroxaban (XARELTO) 20 MG TABS tablet Take 1 tablet (20 mg total) by mouth daily with supper. 90 tablet 1   spironolactone (ALDACTONE) 25 MG tablet TAKE 1/2 TABLET AT BEDTIME 45 tablet 1    thiamine 100 MG tablet Take 1 tablet (100 mg total) by mouth daily. 30 tablet 1   triazolam (HALCION) 0.25 MG tablet Take 1 tablet (0.25 mg total) by mouth at bedtime as needed. for sleep 30 tablet 2   vitamin B-12 (CYANOCOBALAMIN) 1000 MCG tablet Take 1 tablet (1,000 mcg total) by mouth daily. (Patient taking differently: Take 2,500 mcg by mouth daily.) 30 tablet 1   No current facility-administered medications for this visit.    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/09/2021   Anxiety and depression 12/11/2020   B12 deficiency 12/11/2020   History of stroke involving cerebellum 12/11/2020   Balance problem 12/11/2020   PSA elevation 09/08/2020   DDD (degenerative disc disease), cervical 03/28/2020   DDD (degenerative disc disease), lumbar 03/28/2020   S/P ascending aortic replacement 10/16/2019   BRBPR (bright red blood per rectum) 09/06/2019   Mucopurulent chronic bronchitis (HCC) 05/30/2019   Benign prostatic hyperplasia without lower urinary tract symptoms 05/29/2019   BPH with elevated PSA 05/29/2019   Thoracic aortic aneurysm 01/30/2019   Persistent atrial fibrillation (HCC) 06/01/2018   Claudication of both lower extremities (HCC) 07/15/2017   Chronic diastolic CHF (congestive heart failure), NYHA class 2 (HCC) 07/06/2017   Mild tetrahydrocannabinol (THC) abuse 12/30/2016   Essential hypertension 12/24/2016   Hyperlipidemia LDL goal <130 05/11/2016   Type 2 diabetes mellitus with complication, without long-term current use of insulin (HCC) 05/11/2016   Routine general medical   examination at a health care facility 10/06/2013   COLONIC POLYPS 05/28/2009   Obesity, morbid (HCC) 05/28/2009   ERECTILE DYSFUNCTION, 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  Plan  Feedback provided regarding the patient's neuropsychological evaluation. He and his partner Clinton Black clearly have a good alliance and she is actively involved in his care. Encouraged him to continue working with his medical treatment providers to eliminate any underlying medical conditions that may be contributing to cognitive status and to keep on with optimal health behaviors. Clinton Black was encouraged to contact me if any questions arise or if further follow up is desired.    V. , PsyD, ABN Clinical Neuropsychologist  Service(s) Provided at This Encounter: 28 minutes ([id number]; Conjoint therapy with patient present)    

## 2021-04-08 NOTE — Patient Instructions (Signed)
Your performance and presentation on assessment was consistent with a mild level of cognitive difficulty. Your primary issues appear to be with respect to attention, working memory, and various facets of what I would call "executive control." Executive control is a higher order cognitive ability involved in regulating other cognitive resources. Much like the conductor of an orchestra coordinates multiple instruments to make music, executive capacities coordinate other lower-order skills (e.g., movement, language, attention) to form complex human behaviors. Individuals with executive control problems are often capable of doing most of the things they did before they were having problems, but they may not do so as effortlessly, efficiently, and consistently. These difficulties often manifest as problems tracking information, multitasking, and paying attention. Executive control problems often result in cognitive inefficiency and can present as "memory problems," because they decrease encoding and spontaneous retrieval of information.    The best diagnosis is mild cognitive impairment. In your case, I think that your mild cognitive impairment is likely due to the residua of your altered mental status. The most important thing is figuring out what caused your altered mental status and eliminating it or treating any underlying problems. Keep working on that with your medical doctors.  Often times, it can be helpful to optimize things such as health behaviors, sleep, making sure you are not ingesting brain impairing medications or substances and the like. I therefore think it is a good idea that you have stopped drinking alcohol. You may also wish to look into less sedating options to help you sleep, because Halcion is a benzodiazepine that can contribute to thinking and memory problems. Obviously smoking cannabis can also cause cognitive problems and that may be a factor as well.   I think that your condition may  continue to improve as you are further out from your episode of altered mental status, so long as there are no underlying medical issues that are being missed. There is an outside chance that your condition is due to a progressive cause, however, and if that is the case then it will get worse (not better) over time. Altered mental status or what is known as "delirium "is sometimes a sign of an underlying condition, and it is also one of the biggest risk factors for developing an underlying condition. Nevertheless, I do not feel that Alzheimer's or other underlying similar progressive condition fit your presentation well at this time and would encourage you to simply do your best with health behaviors to minimize any risk. .   Think you should return to clinic within a year to monitor your progress.   There are few things as disruptive to brain functioning as not getting a good night's sleep. For sleep, I recommend against using medications, which can have lingering sedating effects on the brain and rob your brain of restful REM sleep. Instead, consider trying some of the following sleep hygiene recommendations. They may not work at once and may take effort, but the effort you spend is likely to be rewarded with better sleep eventually:   Stick to a sleep schedule of the same bedtime and wake time even on the weekends, which can help to regulate your body's internal clock so that you fall asleep and stay asleep.  Practice a relaxing bedtime ritual (conducted away from bright lights) which will help separate your sleep from stimulating activities and prepare your body to fall asleep when you go to bed.  Avoid naps, especially in the afternoon.  Evaluate your room and create conditions  that will promote sleep such as keeping it cool (between 60 - 67 degrees), quiet, and free from any lights. Consider using blackout curtains, a "white noise" generator, or fan that will help mask any noises that might prevent  you from going to sleep or awaken you during the night.  Sleep on a comfortable mattress and pillows.  Avoid bright light in the evening and excessive use of portable electronic devices right before bed that may contain light frequencies that can contribute to sleep problems.  Avoid alcohol, cigarettes, or heavy meals in the evening. If you must eat, consume a light snack 45 minutes before bed.  Use your bed only for sleep to strengthen the association between your bed and sleep.  If you can't go to sleep within 30 minutes, go into another room and do something relaxing until you feel tired. Then, come back and try to go to sleep again for 30 minutes and repeat until sleep is achieved.  Some people find over the counter melatonin to be helpful for sleep, which you could discuss with a pharmacist or prescribing provider.    Staying active mentally is crucially important. This can include formal types of brain training, such as cognitive rehabilitation, sudoku, crossword puzzles and the like, although the best thing is to stay active and engaged in your life. This includes having meaningful hobbies and interests that you pursue and especially cultivating satisfying social relationships, which are a form of cognitive stimulation and have been shown to be protective in and of themselves.

## 2021-04-18 ENCOUNTER — Encounter: Payer: Medicare HMO | Admitting: Counselor

## 2021-04-21 ENCOUNTER — Other Ambulatory Visit: Payer: Self-pay | Admitting: Internal Medicine

## 2021-04-21 ENCOUNTER — Telehealth: Payer: Self-pay | Admitting: Internal Medicine

## 2021-04-21 DIAGNOSIS — G4700 Insomnia, unspecified: Secondary | ICD-10-CM

## 2021-04-21 MED ORDER — TRIAZOLAM 0.25 MG PO TABS: 0.2500 mg | ORAL_TABLET | Freq: Every evening | ORAL | 2 refills | Status: DC | PRN

## 2021-04-21 NOTE — Telephone Encounter (Signed)
1.Medication Requested: triazolam (HALCION) 0.25 MG tablet  2. Pharmacy (Name, Street, [place]):  [id number] - [place], [place] Phone:  [phone]  Fax:  [phone]      3. On Med List: yes  4. Last Visit with PCP:08.08.22  5. Next visit date with PCP:12.15.22   Agent: Please be advised that RX refills may take up to 3 business days. We ask that you follow-up with your pharmacy.

## 2021-04-25 ENCOUNTER — Encounter: Payer: Medicare HMO | Admitting: Counselor

## 2021-05-07 ENCOUNTER — Other Ambulatory Visit: Payer: Self-pay | Admitting: Internal Medicine

## 2021-05-07 ENCOUNTER — Other Ambulatory Visit: Payer: Self-pay

## 2021-05-07 DIAGNOSIS — G4700 Insomnia, unspecified: Secondary | ICD-10-CM

## 2021-05-07 MED ORDER — MEMANTINE HCL 5 MG PO TABS: 5.0000 mg | ORAL_TABLET | Freq: Every day | ORAL | 1 refills | Status: DC

## 2021-05-18 ENCOUNTER — Other Ambulatory Visit: Payer: Self-pay | Admitting: Cardiology

## 2021-05-18 ENCOUNTER — Other Ambulatory Visit: Payer: Self-pay | Admitting: Internal Medicine

## 2021-05-29 ENCOUNTER — Other Ambulatory Visit: Payer: Self-pay

## 2021-05-29 ENCOUNTER — Ambulatory Visit (INDEPENDENT_AMBULATORY_CARE_PROVIDER_SITE_OTHER): Payer: Medicare HMO | Admitting: Internal Medicine

## 2021-05-29 ENCOUNTER — Encounter: Payer: Self-pay | Admitting: Internal Medicine

## 2021-05-29 VITALS — BP 120/76 | HR 54 | Temp 98.2°F | Ht 74.0 in | Wt 227.0 lb

## 2021-05-29 DIAGNOSIS — Z23 Encounter for immunization: Secondary | ICD-10-CM | POA: Diagnosis not present

## 2021-05-29 DIAGNOSIS — E118 Type 2 diabetes mellitus with unspecified complications: Secondary | ICD-10-CM

## 2021-05-29 DIAGNOSIS — I10 Essential (primary) hypertension: Secondary | ICD-10-CM

## 2021-05-29 DIAGNOSIS — I480 Paroxysmal atrial fibrillation: Secondary | ICD-10-CM | POA: Insufficient documentation

## 2021-05-29 DIAGNOSIS — N400 Enlarged prostate without lower urinary tract symptoms: Secondary | ICD-10-CM | POA: Diagnosis not present

## 2021-05-29 DIAGNOSIS — E538 Deficiency of other specified B group vitamins: Secondary | ICD-10-CM

## 2021-05-29 DIAGNOSIS — E1140 Type 2 diabetes mellitus with diabetic neuropathy, unspecified: Secondary | ICD-10-CM | POA: Diagnosis not present

## 2021-05-29 DIAGNOSIS — R972 Elevated prostate specific antigen [PSA]: Secondary | ICD-10-CM

## 2021-05-29 LAB — CBC WITH DIFFERENTIAL/PLATELET
Basophils Absolute: 0.1 10*3/uL (ref 0.0–0.1)
Basophils Relative: 0.9 % (ref 0.0–3.0)
Eosinophils Absolute: 0.5 10*3/uL (ref 0.0–0.7)
Eosinophils Relative: 6.8 % — ABNORMAL HIGH (ref 0.0–5.0)
HCT: 42.2 % (ref 39.0–52.0)
Hemoglobin: 13.9 g/dL (ref 13.0–17.0)
Lymphocytes Relative: 25.1 % (ref 12.0–46.0)
Lymphs Abs: 1.9 10*3/uL (ref 0.7–4.0)
MCHC: 33 g/dL (ref 30.0–36.0)
MCV: 90 fl (ref 78.0–100.0)
Monocytes Absolute: 0.4 10*3/uL (ref 0.1–1.0)
Monocytes Relative: 5.7 % (ref 3.0–12.0)
Neutro Abs: 4.8 10*3/uL (ref 1.4–7.7)
Neutrophils Relative %: 61.5 % (ref 43.0–77.0)
Platelets: 184 10*3/uL (ref 150.0–400.0)
RBC: 4.68 Mil/uL (ref 4.22–5.81)
RDW: 13.4 % (ref 11.5–15.5)
WBC: 7.8 10*3/uL (ref 4.0–10.5)

## 2021-05-29 LAB — BASIC METABOLIC PANEL
BUN: 22 mg/dL (ref 6–23)
CO2: 30 mEq/L (ref 19–32)
Calcium: 10.1 mg/dL (ref 8.4–10.5)
Chloride: 106 mEq/L (ref 96–112)
Creatinine, Ser: 1.16 mg/dL (ref 0.40–1.50)
GFR: 62.29 mL/min (ref >60.00)
Glucose, Bld: 94 mg/dL (ref 70–99)
Potassium: 4.5 mEq/L (ref 3.5–5.1)
Sodium: 142 mEq/L (ref 135–145)

## 2021-05-29 LAB — PSA: PSA: 5.18 ng/mL — ABNORMAL HIGH (ref 0.10–4.00)

## 2021-05-29 LAB — HEMOGLOBIN A1C: Hgb A1c MFr Bld: 5.8 % (ref 4.6–6.5)

## 2021-05-29 LAB — VITAMIN B12: Vitamin B-12: 1198 pg/mL — ABNORMAL HIGH (ref 211–911)

## 2021-05-29 LAB — FOLATE: Folate: 7.8 ng/mL (ref >5.9)

## 2021-05-29 MED ORDER — METOPROLOL TARTRATE 25 MG PO TABS: 12.5000 mg | ORAL_TABLET | Freq: Two times a day (BID) | ORAL | 0 refills | Status: DC

## 2021-05-29 MED ORDER — PREGABALIN 50 MG PO CAPS: 50.0000 mg | ORAL_CAPSULE | Freq: Three times a day (TID) | ORAL | 1 refills | Status: DC

## 2021-05-29 MED ORDER — RIVAROXABAN 20 MG PO TABS: 20.0000 mg | ORAL_TABLET | Freq: Every day | ORAL | 1 refills | Status: DC

## 2021-05-29 NOTE — Progress Notes (Signed)
Subjective:  Patient ID: Clinton Black, male    DOB: 09-14-1945  Age: 75 y.o. MRN: [id number]  CC: Atrial Fibrillation, Hypertension, Congestive Heart Failure, and Diabetes  This visit occurred during the SARS-CoV-2 public health emergency.  Safety protocols were in place, including screening questions prior to the visit, additional usage of staff PPE, and extensive cleaning of exam room while observing appropriate contact time as indicated for disinfecting solutions.    HPI Clinton Black presents for f/up -  He complains of worsening neuropathy pain in his feet with pins and needle sensations and the sensation that there are razors cutting his feet.  He has tingling but no numbness.  He is active and denies chest pain, shortness of breath, diaphoresis, dizziness, lightheadedness, palpitations, or near syncope.  Outpatient Medications Prior to Visit  Medication Sig Dispense Refill   atorvastatin (LIPITOR) 20 MG tablet TAKE 1 TABLET AT BEDTIME 90 tablet 1   citalopram (CELEXA) 20 MG tablet Take 1 tablet (20 mg total) by mouth daily. 90 tablet 3   losartan (COZAAR) 25 MG tablet TAKE 2 TABLETS EVERY DAY 180 tablet 1   memantine (NAMENDA) 5 MG tablet Take 1 tablet (5 mg total) by mouth at bedtime. 30 tablet 1   spironolactone (ALDACTONE) 25 MG tablet TAKE 1/2 TABLET AT BEDTIME 45 tablet 0   thiamine 100 MG tablet Take 1 tablet (100 mg total) by mouth daily. 30 tablet 1   triazolam (HALCION) 0.25 MG tablet TAKE 1 TABLET (0.25 MG TOTAL) BY MOUTH AT BEDTIME AS NEEDED. FOR SLEEP *NOT COVERED 30 tablet 2   vitamin B-12 (CYANOCOBALAMIN) 1000 MCG tablet Take 1 tablet (1,000 mcg total) by mouth daily. (Patient taking differently: Take 2,500 mcg by mouth daily.) 30 tablet 1   metoprolol tartrate (LOPRESSOR) 25 MG tablet TAKE 1/2 TABLET TWICE DAILY 90 tablet 0   rivaroxaban (XARELTO) 20 MG TABS tablet Take 1 tablet (20 mg total) by mouth daily with supper. 90 tablet 1   No facility-administered  medications prior to visit.    ROS Review of Systems  Constitutional:  Negative for appetite change, diaphoresis and fatigue.  HENT: Negative.    Eyes: Negative.   Respiratory:  Negative for cough, chest tightness, shortness of breath and wheezing.   Cardiovascular:  Negative for chest pain, palpitations and leg swelling.  Gastrointestinal:  Negative for abdominal pain, constipation, diarrhea, nausea and vomiting.  Endocrine: Negative.   Genitourinary: Negative.  Negative for difficulty urinating.  Musculoskeletal: Negative.   Skin: Negative.   Neurological:  Negative for dizziness, weakness, light-headedness, numbness and headaches.  Hematological:  Negative for adenopathy. Does not bruise/bleed easily.  Psychiatric/Behavioral:  Positive for confusion, decreased concentration and sleep disturbance. Negative for self-injury and suicidal ideas. The patient is nervous/anxious.    Objective:  BP 120/76 (BP Location: Right Arm, Patient Position: Sitting, Cuff Size: Large)    Pulse (!) 54    Temp 98.2 F (36.8 C) (Oral)    Ht 6\' 2"  (1.88 m)    Wt 227 lb (103 kg)    SpO2 96%    BMI 29.15 kg/m   BP Readings from Last 3 Encounters:  05/29/21 120/76  01/20/21 134/70  01/09/21 116/72    Wt Readings from Last 3 Encounters:  05/29/21 227 lb (103 kg)  01/20/21 228 lb (103.4 kg)  01/09/21 223 lb (101.2 kg)    Physical Exam Vitals reviewed.  Constitutional:      Appearance: Normal appearance.  HENT:  Nose: Nose normal.     Mouth/Throat:     Mouth: Mucous membranes are moist.  Eyes:     General: No scleral icterus.    Conjunctiva/sclera: Conjunctivae normal.  Cardiovascular:     Rate and Rhythm: Normal rate and regular rhythm.     Heart sounds: No murmur heard. Pulmonary:     Breath sounds: No stridor. No wheezing, rhonchi or rales.  Abdominal:     General: Abdomen is flat.     Palpations: There is no mass.     Tenderness: There is no abdominal tenderness. There is no  guarding or rebound.     Hernia: No hernia is present.  Musculoskeletal:        General: Normal range of motion.     Cervical back: Neck supple.     Right lower leg: No edema.     Left lower leg: No edema.  Lymphadenopathy:     Cervical: No cervical adenopathy.  Skin:    General: Skin is warm and dry.     Coloration: Skin is not pale.  Neurological:     General: No focal deficit present.     Mental Status: He is alert.  Psychiatric:        Mood and Affect: Mood normal.        Behavior: Behavior normal.    Lab Results  Component Value Date   WBC 7.8 05/29/2021   HGB 13.9 05/29/2021   HCT 42.2 05/29/2021   PLT 184.0 05/29/2021   GLUCOSE 94 05/29/2021   CHOL 98 12/11/2020   TRIG 111.0 12/11/2020   HDL 35.60 (L) 12/11/2020   LDLDIRECT 78.0 05/29/2019   LDLCALC 40 12/11/2020   ALT 9 12/11/2020   AST 10 12/11/2020   NA 142 05/29/2021   K 4.5 05/29/2021   CL 106 05/29/2021   CREATININE 1.16 05/29/2021   BUN 22 05/29/2021   CO2 30 05/29/2021   TSH 1.69 12/11/2020   PSA 5.18 (H) 05/29/2021   INR 1.4 (H) 09/15/2020   HGBA1C 5.8 05/29/2021   MICROALBUR 0.8 12/11/2020    MR BRAIN W WO CONTRAST  Result Date: 12/26/2020 CLINICAL DATA:  History of cerebellar stroke EXAM: MRI HEAD WITHOUT AND WITH CONTRAST TECHNIQUE: Multiplanar, multiecho pulse sequences of the brain and surrounding structures were obtained without and with intravenous contrast. CONTRAST:  65mL MULTIHANCE GADOBENATE DIMEGLUMINE 529 MG/ML IV SOLN COMPARISON:  April 2022 FINDINGS: Brain: There is no acute infarction or intracranial hemorrhage. There is no intracranial mass, mass effect, or edema. There is no hydrocephalus or extra-axial fluid collection. Prominence of the ventricles and sulci reflects generalized parenchymal volume loss similar to the prior study. Patchy T2 hyperintensity in the supratentorial white matter is nonspecific but probably reflects stable mild chronic microvascular ischemic changes. There  is a small chronic right cerebellar infarct. No abnormal enhancement. Vascular: Major vessel flow voids at the skull base are preserved. Skull and upper cervical spine: Normal marrow signal is preserved. Sinuses/Orbits: Minor mucosal thickening.  Orbits are unremarkable. Other: Sella is unremarkable.  Mastoid air cells are clear. IMPRESSION: No evidence of recent infarction, hemorrhage, or mass. No abnormal enhancement. Stable chronic microvascular ischemic changes. Small chronic right cerebellar infarct. No substantial change since the prior study. Electronically Signed   By: Macy Mis M.D.   On: 12/26/2020 16:39   EEG adult  Result Date: 12/26/2020 Cameron Sprang, MD     01/17/2021 11:04 AM ELECTROENCEPHALOGRAM REPORT Date of Study: 12/26/2020 Patient's Name: SAAHAS HIDROGO  MRN: [id number] Date of Birth: October 05, 1945 Referring Provider: Sharene Butters, PA-C Clinical History: This is a 75 year old man with rapidly progressive memory loss Medications: Uroxatral Lipitor Celexa Synjardy Cozaar Namenda Metoprolol Xarelto Aldactone Thiamine B12 Technical Summary: A multichannel digital EEG recording measured by the international 10-20 system with electrodes applied with paste and impedances below 5000 ohms performed in our laboratory with EKG monitoring in an awake and asleep patient.  Hyperventilation was not performed. Photic stimulation was performed.  The digital EEG was referentially recorded, reformatted, and digitally filtered in a variety of bipolar and referential montages for optimal display.  Description: The patient is awake and asleep during the recording.  During maximal wakefulness, there is a symmetric, medium voltage 9-10 Hz posterior dominant rhythm that attenuates with eye opening.  There is occasional focal 4-5 Hz theta slowing over the left temporal region. During drowsiness and sleep, there is an increase in theta slowing of the background. Asymmetric vertex waves are seen, better formed over  the right parasagittal derivations. Photic stimulation did not elicit any abnormalities.  There were no epileptiform discharges or electrographic seizures seen.  EKG lead was unremarkable. Impression: This awake and asleep EEG is abnormal due to the presence of: Occasional focal slowing over the left temporal region Asymmetric vertex waves with suppression on the left central region Clinical Correlation of the above findings indicates focal cerebral dysfunction over the left hemisphere suggestive of underlying structural or physiologic abnormality. The absence of epileptiform discharges does not exclude a clinical diagnosis of epilepsy. Clinical correlation is advised. Ellouise Newer, M.D.    Assessment & Plan:   Jasyn was seen today for atrial fibrillation, hypertension, congestive heart failure and diabetes.  Diagnoses and all orders for this visit:  Flu vaccine need -     Flu Vaccine QUAD High Dose(Fluad)  PAF (paroxysmal atrial fibrillation) ([place])- He has good rate and rhythm control. -     rivaroxaban (XARELTO) 20 MG TABS tablet; Take 1 tablet (20 mg total) by mouth daily with supper. -     metoprolol tartrate (LOPRESSOR) 25 MG tablet; Take 0.5 tablets (12.5 mg total) by mouth 2 (two) times daily.  Essential hypertension- His blood pressure is adequately well controlled. -     metoprolol tartrate (LOPRESSOR) 25 MG tablet; Take 0.5 tablets (12.5 mg total) by mouth 2 (two) times daily. -     CBC with Differential/Platelet; Future -     Basic metabolic panel; Future -     Basic metabolic panel -     CBC with Differential/Platelet  Diabetic neuropathy, painful (HCC) -     pregabalin (LYRICA) 50 MG capsule; Take 1 capsule (50 mg total) by mouth 3 (three) times daily. -     L-Methylfolate-Algae-B12-B6 (METANX) 3-90.314-2-35 MG CAPS; Take 1 capsule by mouth 2 (two) times daily.  Type 2 diabetes mellitus with complication, without long-term current use of insulin ([place])- His blood sugar is  well controlled. -     Hemoglobin A1c; Future -     Basic metabolic panel; Future -     Basic metabolic panel -     Hemoglobin A1c  BPH with elevated PSA- His PSA has not risen much.  This is a reassuring sign that he does not have prostate cancer. -     PSA; Future -     PSA  PSA elevation -     PSA; Future -     PSA  B12 deficiency- His B12 level is too high.  His folate level is normal. -     Vitamin B12; Future -     Folate; Future -     Folate -     Vitamin B12  I have changed Bharath P. Douglass's metoprolol tartrate. I am also having him start on pregabalin and Metanx. Additionally, I am having him maintain his thiamine, vitamin B-12, citalopram, atorvastatin, memantine, triazolam, losartan, spironolactone, and rivaroxaban.  Meds ordered this encounter  Medications   rivaroxaban (XARELTO) 20 MG TABS tablet    Sig: Take 1 tablet (20 mg total) by mouth daily with supper.    Dispense:  90 tablet    Refill:  1   metoprolol tartrate (LOPRESSOR) 25 MG tablet    Sig: Take 0.5 tablets (12.5 mg total) by mouth 2 (two) times daily.    Dispense:  90 tablet    Refill:  0   pregabalin (LYRICA) 50 MG capsule    Sig: Take 1 capsule (50 mg total) by mouth 3 (three) times daily.    Dispense:  270 capsule    Refill:  1   L-Methylfolate-Algae-B12-B6 (METANX) 3-90.314-2-35 MG CAPS    Sig: Take 1 capsule by mouth 2 (two) times daily.    Dispense:  180 capsule    Refill:  1     Follow-up: Return in about 6 months (around 11/27/2021).  Scarlette Calico, MD

## 2021-05-29 NOTE — Patient Instructions (Signed)
Diabetic Neuropathy Diabetic neuropathy refers to nerve damage that is caused by diabetes. Over time, people with diabetes can develop nerve damage throughout the body. There are several types of diabetic neuropathy: Peripheral neuropathy. This is the most common type of diabetic neuropathy. It damages the nerves that carry signals between the spinal cord and other parts of the body (peripheral nerves). This usually affects nerves in the feet, legs, hands, and arms. Autonomic neuropathy. This type causes damage to nerves that control involuntary functions (autonomic nerves). Involuntary functions are functions of the body that you do not control. They include heartbeat, body temperature, blood pressure, urination, digestion, sweating, sexual function, or response to changes in blood glucose. Focal neuropathy. This type of nerve damage affects one area of the body, such as an arm, a leg, or the face. The injury may involve one nerve or a small group of nerves. Focal neuropathy can be painful and unpredictable. It occurs most often in older adults with diabetes. This often develops suddenly, but usually improves over time and does not cause long-term problems. Proximal neuropathy. This type of nerve damage affects the nerves of the thighs, hips, buttocks, or legs. It causes severe pain, weakness, and muscle death (atrophy), usually in the thigh muscles. It is more common among older men and people who have type 2 diabetes. The length of recovery time may vary. What are the causes? Peripheral, autonomic, and focal neuropathies are caused by diabetes that is not well controlled with treatment. The cause of proximal neuropathy is not known, but it may be caused by inflammation related to uncontrolled blood glucose levels. What are the signs or symptoms? Peripheral neuropathy Peripheral neuropathy develops slowly over time. When the nerves of the feet and legs no longer work, you may experience: Burning,  stabbing, or aching pain in the legs or feet. Pain or cramping in the legs or feet. Loss of feeling (numbness) and inability to feel pressure or pain in the feet. This can lead to: Thick calluses or sores on areas of constant pressure. Ulcers. Reduced ability to feel temperature changes. Foot deformities. Muscle weakness. Loss of balance or coordination. Autonomic neuropathy The symptoms of autonomic neuropathy vary depending on which nerves are affected. Symptoms may include: Problems with digestion, such as: Nausea or vomiting. Poor appetite. Bloating. Diarrhea or constipation. Trouble swallowing. Losing weight without trying to. Problems with the heart, blood, and lungs, such as: Dizziness, especially when standing up. Fainting. Shortness of breath. Irregular heartbeat. Bladder problems, such as: Trouble starting or stopping urination. Leaking urine. Trouble emptying the bladder. Urinary tract infections (UTIs). Problems with other body functions, such as: Sweat. You may sweat too much or too little. Temperature. You might get hot easily. Or, you might feel cold more than usual. Sexual function. Men may not be able to get or maintain an erection. Women may have vaginal dryness and difficulty with arousal. Focal neuropathy Symptoms affect only one area of the body. Common symptoms include: Numbness. Tingling. Burning pain. Prickling feeling. Very sensitive skin. Weakness. Inability to move (paralysis). Muscle twitching. Muscles getting smaller (wasting). Poor coordination. Double or blurred vision. Proximal neuropathy Sudden, severe pain in the hip, thigh, or buttocks. Pain may spread from the back into the legs (sciatica). Pain and numbness in the arms and legs. Tingling. Loss of bladder control or bowel control. Weakness and wasting of thigh muscles. Difficulty getting up from a seated position. Abdominal swelling. Unexplained weight loss. How is this  diagnosed? Diagnosis varies depending on the type   of neuropathy your health care provider suspects. Peripheral neuropathy Your health care provider will do a neurologic exam. This exam checks your reflexes, how you move, and what you can feel. You may have other tests, such as: Blood tests. Tests of the fluid that surrounds the spinal cord (lumbar puncture). CT scan. MRI. Checking the nerves that control muscles (electromyogram, or EMG). Checking how quickly signals pass through your nerves (nerve conduction study). Checking a small piece of a nerve using a microscope (biopsy). Autonomic neuropathy You may have tests, such as: Tests to measure your blood pressure and heart rate. You may be secured to an exam table that moves you from a lying position to an upright position (table tilt test). Breathing tests to check your lungs. Tests to check how food moves through the digestive system (gastric emptying tests). Blood, sweat, or urine tests. Ultrasound of your bladder. Spinal fluid tests. Focal neuropathy This condition may be diagnosed with: A neurologic exam. CT scan. MRI. EMG. Nerve conduction study. Proximal neuropathy There is no test to diagnose this type of neuropathy. You may have tests to rule out other possible causes of this type of neuropathy. Tests may include: X-rays of your spine and lumbar region. Lumbar puncture. MRI. How is this treated? The goal of treatment is to keep nerve damage from getting worse. Treatment may include: Following your diabetes management plan. This will help keep your blood glucose level and your A1C level within your target range. This is the most important treatment. Using prescription pain medicine. Follow these instructions at home: Diabetes management Follow your diabetes management plan as told by your health care provider. Check your blood glucose levels. Keep your blood glucose in your target range. Have your A1C level checked at  least two times a year, or as often as told. Take over the counter and prescription medicines only as told by your health care provider. This includes insulin and diabetes medicine.  Lifestyle  Do not use any products that contain nicotine or tobacco, such as cigarettes, e-cigarettes, and chewing tobacco. If you need help quitting, ask your health care provider. Be physically active every day. Include strength training and balance exercises. Follow a healthy meal plan. Work with your health care provider to manage your blood pressure. General instructions Ask your health care provider if the medicine prescribed to you requires you to avoid driving or using machinery. Check your skin and feet every day for cuts, bruises, redness, blisters, or sores. Keep all follow-up visits. This is important. Contact a health care provider if: You have burning, stabbing, or aching pain in your legs or feet. You are unable to feel pressure or pain in your feet. You develop problems with digestion, such as: Nausea. Vomiting. Bloating. Constipation. Diarrhea. Abdominal pain. You have difficulty with urination, such as: Inability to control when you urinate (incontinence). Inability to completely empty the bladder (retention). You feel as if your heart is racing (palpitations). You feel dizzy, weak, or faint when you stand up. Get help right away if: You cannot urinate. You have sudden weakness or loss of coordination. You have trouble speaking. You have pain or pressure in your chest. You have an irregular heartbeat. You have sudden inability to move a part of your body. These symptoms may represent a serious problem that is an emergency. Do not wait to see if the symptoms will go away. Get medical help right away. Call your local emergency services (911 in the [place] Do not drive yourself to   the hospital. Summary Diabetic neuropathy is nerve damage that is caused by diabetes. It can cause numbness  and pain in the arms, legs, digestive tract, heart, and other body systems. This condition is treated by keeping your blood glucose level and your A1C level within your target range. This can help prevent neuropathy from getting worse. Check your skin and feet every day for cuts, bruises, redness, blisters, or sores. Do not use any products that contain nicotine or tobacco, such as cigarettes, e-cigarettes, and chewing tobacco. This information is not intended to replace advice given to you by your health care provider. Make sure you discuss any questions you have with your health care provider. Document Revised: 10/12/2019 Document Reviewed: 10/12/2019 Elsevier Patient Education  2022 [employer]  

## 2021-05-30 MED ORDER — METANX 3-90.314-2-35 MG PO CAPS: 1.0000 | ORAL_CAPSULE | Freq: Two times a day (BID) | ORAL | 1 refills | Status: DC

## 2021-07-10 ENCOUNTER — Telehealth: Payer: Self-pay | Admitting: Internal Medicine

## 2021-07-10 ENCOUNTER — Other Ambulatory Visit: Payer: Self-pay | Admitting: Internal Medicine

## 2021-07-10 DIAGNOSIS — G4700 Insomnia, unspecified: Secondary | ICD-10-CM

## 2021-07-10 DIAGNOSIS — G4730 Sleep apnea, unspecified: Secondary | ICD-10-CM

## 2021-07-10 MED ORDER — TRIAZOLAM 0.25 MG PO TABS: ORAL_TABLET | ORAL | 3 refills | Status: DC

## 2021-07-10 NOTE — Telephone Encounter (Signed)
1.Medication Requested:triazolam (HALCION) 0.25 MG tablet  2. Pharmacy (Name, Street, [place]): [id number] - [place], [place]  Phone:  [phone] Fax:  [phone]   3. On Med List: yes  4. Last Visit with PCP: 12.15.22  5. Next visit date with PCP: 03.14.23   Agent: Please be advised that RX refills may take up to 3 business days. We ask that you follow-up with your pharmacy.

## 2021-07-30 ENCOUNTER — Other Ambulatory Visit: Payer: Self-pay | Admitting: Internal Medicine

## 2021-07-30 DIAGNOSIS — G4730 Sleep apnea, unspecified: Secondary | ICD-10-CM

## 2021-07-30 DIAGNOSIS — G4700 Insomnia, unspecified: Secondary | ICD-10-CM

## 2021-08-04 ENCOUNTER — Telehealth: Payer: Self-pay | Admitting: Internal Medicine

## 2021-08-04 NOTE — Telephone Encounter (Signed)
A representative from Dr.Poole's office calls today in regards to PT having a procedure done tomorrow! He is having 3 extractions done but needs to be off of his Alen Blew for the procedure. They would like either a verbal or a fax put in to approve of him being off the medication.  CB: [phone] Fax: [phone]

## 2021-08-04 NOTE — Telephone Encounter (Signed)
Signed order by PCP has been faxed as Dr. Orvis Brill office message stated that lunch hours are from 1-2pm.

## 2021-08-04 NOTE — Telephone Encounter (Signed)
Dr. Orvis Brill fax number is down. I have called to give there verbaal order below. Shay (Dr. Irven [place] office) requested that the order be mailed and she provided the office address. I have sent that out via [employer].

## 2021-08-13 DIAGNOSIS — K006 Disturbances in tooth eruption: Secondary | ICD-10-CM | POA: Diagnosis not present

## 2021-08-14 ENCOUNTER — Other Ambulatory Visit: Payer: Self-pay | Admitting: Internal Medicine

## 2021-08-14 DIAGNOSIS — E118 Type 2 diabetes mellitus with unspecified complications: Secondary | ICD-10-CM

## 2021-08-14 DIAGNOSIS — E785 Hyperlipidemia, unspecified: Secondary | ICD-10-CM

## 2021-08-26 ENCOUNTER — Emergency Department (HOSPITAL_COMMUNITY)
Admission: EM | Admit: 2021-08-26 | Discharge: 2021-08-26 | Disposition: A | Discharge status: Home / Self Care | Payer: Medicare HMO | Attending: Emergency Medicine | Admitting: Emergency Medicine

## 2021-08-26 ENCOUNTER — Emergency Department (HOSPITAL_COMMUNITY): Payer: Medicare HMO

## 2021-08-26 ENCOUNTER — Encounter: Payer: Self-pay | Admitting: Internal Medicine

## 2021-08-26 ENCOUNTER — Ambulatory Visit (INDEPENDENT_AMBULATORY_CARE_PROVIDER_SITE_OTHER): Payer: Medicare HMO | Admitting: Internal Medicine

## 2021-08-26 ENCOUNTER — Other Ambulatory Visit: Payer: Self-pay

## 2021-08-26 ENCOUNTER — Encounter (HOSPITAL_COMMUNITY): Payer: Self-pay

## 2021-08-26 VITALS — BP 90/56 | HR 62 | Temp 98.6°F | Resp 16 | Ht 74.0 in | Wt 228.0 lb

## 2021-08-26 DIAGNOSIS — I959 Hypotension, unspecified: Secondary | ICD-10-CM | POA: Insufficient documentation

## 2021-08-26 DIAGNOSIS — I480 Paroxysmal atrial fibrillation: Secondary | ICD-10-CM

## 2021-08-26 DIAGNOSIS — Z7901 Long term (current) use of anticoagulants: Secondary | ICD-10-CM | POA: Insufficient documentation

## 2021-08-26 DIAGNOSIS — I9589 Other hypotension: Secondary | ICD-10-CM | POA: Diagnosis not present

## 2021-08-26 DIAGNOSIS — E119 Type 2 diabetes mellitus without complications: Secondary | ICD-10-CM | POA: Diagnosis not present

## 2021-08-26 DIAGNOSIS — R531 Weakness: Secondary | ICD-10-CM | POA: Diagnosis not present

## 2021-08-26 DIAGNOSIS — I5032 Chronic diastolic (congestive) heart failure: Secondary | ICD-10-CM | POA: Diagnosis not present

## 2021-08-26 DIAGNOSIS — Z79899 Other long term (current) drug therapy: Secondary | ICD-10-CM | POA: Insufficient documentation

## 2021-08-26 DIAGNOSIS — R5383 Other fatigue: Secondary | ICD-10-CM | POA: Insufficient documentation

## 2021-08-26 DIAGNOSIS — R42 Dizziness and giddiness: Secondary | ICD-10-CM | POA: Diagnosis not present

## 2021-08-26 DIAGNOSIS — I509 Heart failure, unspecified: Secondary | ICD-10-CM | POA: Insufficient documentation

## 2021-08-26 DIAGNOSIS — I110 Hypertensive heart disease with heart failure: Secondary | ICD-10-CM | POA: Diagnosis not present

## 2021-08-26 LAB — URINALYSIS, ROUTINE W REFLEX MICROSCOPIC
Bacteria, UA: NONE SEEN
Bilirubin Urine: NEGATIVE
Glucose, UA: NEGATIVE mg/dL
Hgb urine dipstick: NEGATIVE
Ketones, ur: NEGATIVE mg/dL
Nitrite: NEGATIVE
Protein, ur: NEGATIVE mg/dL
Specific Gravity, Urine: 1.02 (ref 1.005–1.030)
pH: 5 (ref 5.0–8.0)

## 2021-08-26 LAB — CBC WITH DIFFERENTIAL/PLATELET
Abs Immature Granulocytes: 0.04 10*3/uL (ref 0.00–0.07)
Basophils Absolute: 0.1 10*3/uL (ref 0.0–0.1)
Basophils Relative: 1 %
Eosinophils Absolute: 0.6 10*3/uL — ABNORMAL HIGH (ref 0.0–0.5)
Eosinophils Relative: 7 %
HCT: 41.6 % (ref 39.0–52.0)
Hemoglobin: 13.8 g/dL (ref 13.0–17.0)
Immature Granulocytes: 1 %
Lymphocytes Relative: 26 %
Lymphs Abs: 2.2 10*3/uL (ref 0.7–4.0)
MCH: 30.5 pg (ref 26.0–34.0)
MCHC: 33.2 g/dL (ref 30.0–36.0)
MCV: 91.8 fL (ref 80.0–100.0)
Monocytes Absolute: 0.4 10*3/uL (ref 0.1–1.0)
Monocytes Relative: 5 %
Neutro Abs: 5 10*3/uL (ref 1.7–7.7)
Neutrophils Relative %: 60 %
Platelets: 195 10*3/uL (ref 150–400)
RBC: 4.53 MIL/uL (ref 4.22–5.81)
RDW: 12.8 % (ref 11.5–15.5)
WBC: 8.2 10*3/uL (ref 4.0–10.5)
nRBC: 0 % (ref 0.0–0.2)

## 2021-08-26 LAB — COMPREHENSIVE METABOLIC PANEL
ALT: 10 U/L (ref 0–44)
AST: 13 U/L — ABNORMAL LOW (ref 15–41)
Albumin: 4.1 g/dL (ref 3.5–5.0)
Alkaline Phosphatase: 105 U/L (ref 38–126)
Anion gap: 7 (ref 5–15)
BUN: 23 mg/dL (ref 8–23)
CO2: 26 mmol/L (ref 22–32)
Calcium: 9.3 mg/dL (ref 8.9–10.3)
Chloride: 103 mmol/L (ref 98–111)
Creatinine, Ser: 1.08 mg/dL (ref 0.61–1.24)
GFR, Estimated: >60 mL/min (ref >60)
Glucose, Bld: 95 mg/dL (ref 70–99)
Potassium: 4.6 mmol/L (ref 3.5–5.1)
Sodium: 136 mmol/L (ref 135–145)
Total Bilirubin: 1.1 mg/dL (ref 0.3–1.2)
Total Protein: 6.7 g/dL (ref 6.5–8.1)

## 2021-08-26 LAB — TROPONIN I (HIGH SENSITIVITY)
Troponin I (High Sensitivity): 6 ng/L (ref <18)
Troponin I (High Sensitivity): 6 ng/L

## 2021-08-26 LAB — LIPASE, BLOOD: Lipase: 42 U/L (ref 11–51)

## 2021-08-26 LAB — LACTIC ACID, PLASMA: Lactic Acid, Venous: 0.9 mmol/L (ref 0.5–1.9)

## 2021-08-26 IMAGING — CR DG CHEST 2V
3 series · 3 of 3 positions shown · non-contrast
Comparison: [DATE]

CLINICAL DATA: Weakness and hypotension.  Dizziness.

EXAM:
CHEST - 2 VIEW

[chest lat]
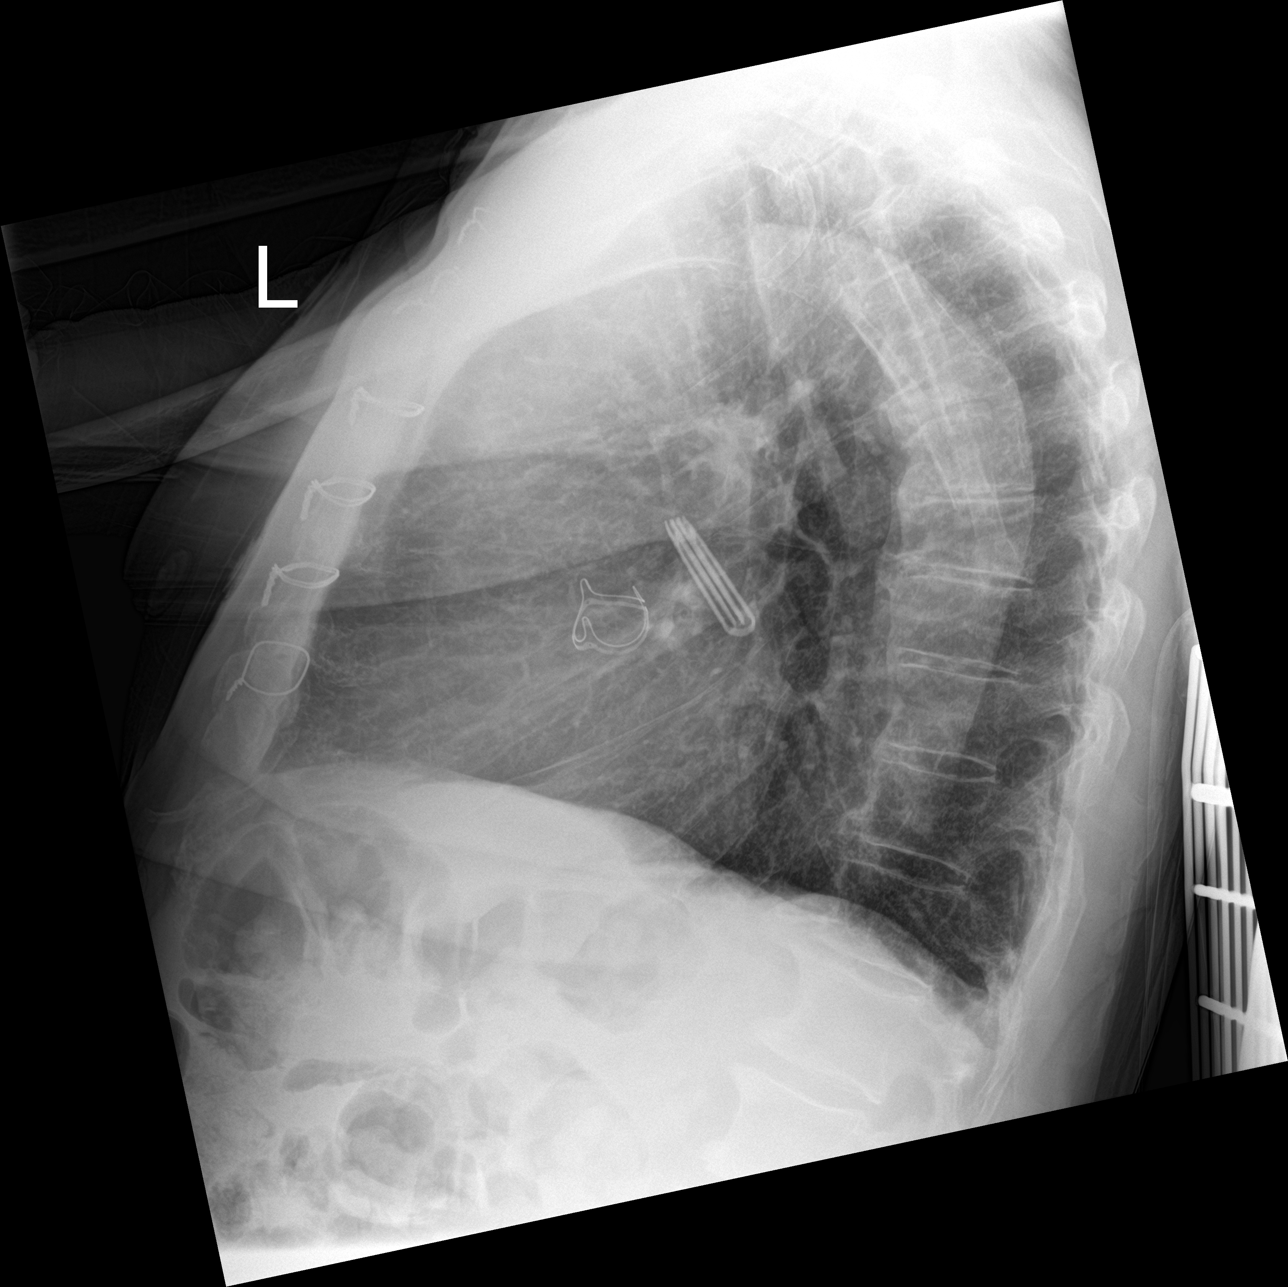

[chest ap (1 of 2)]
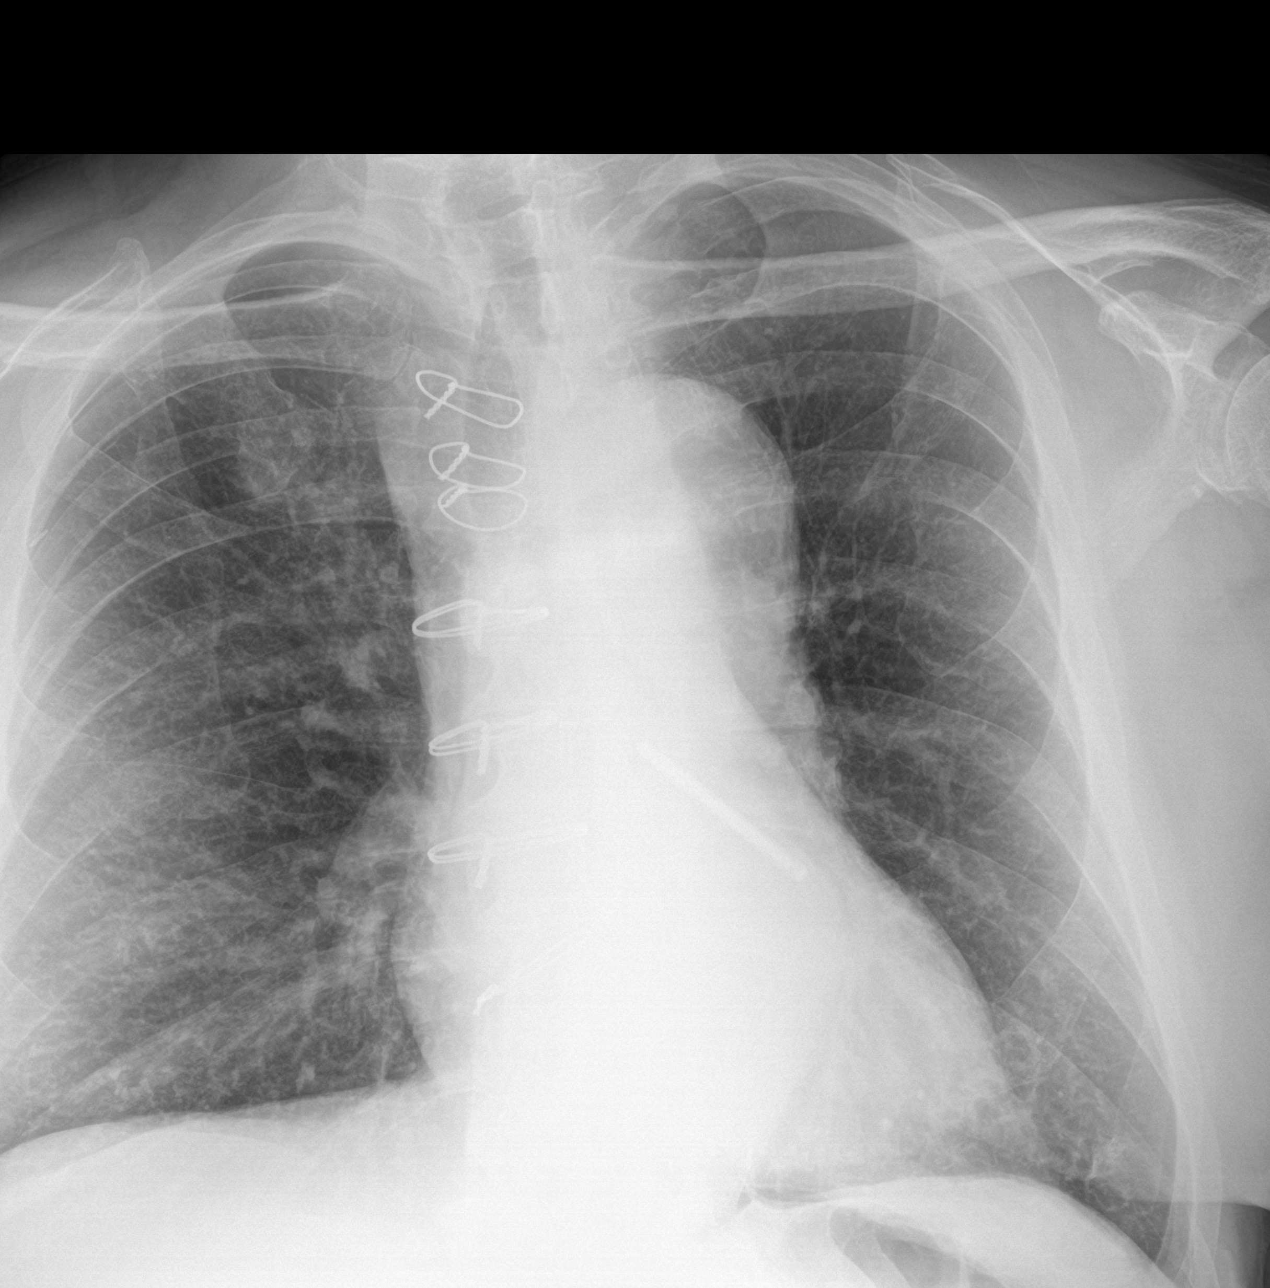

[chest ap (2 of 2)]
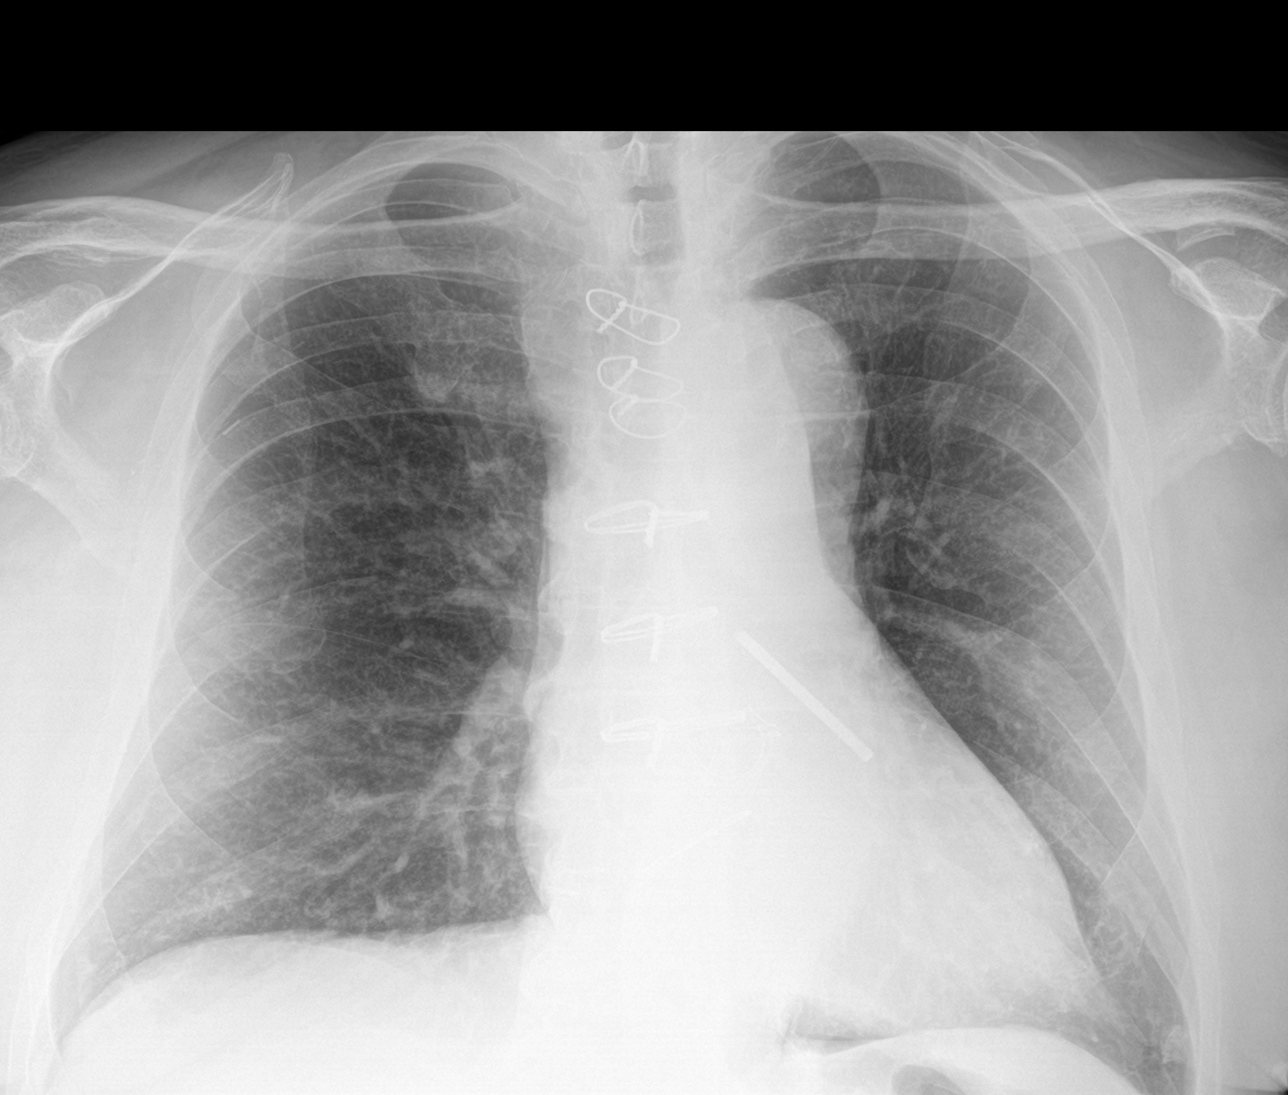

[3 of 3 positions shown; findings below may reference images not displayed]

FINDINGS: Postoperative changes in the mediastinum with aortic valve
prosthesis and clip over the left atrial appendage. Heart size and
pulmonary vascularity are normal. Lungs are clear. No pleural
effusions. No pneumothorax. Mediastinal contours appear intact.
Dilated aortic knob may indicate aneurysm. Degenerative changes in
the spine.
IMPRESSION: No active cardiopulmonary disease. Dilated aortic knob may indicate
aneurysm.

## 2021-08-26 NOTE — ED Provider Notes (Signed)
[facility] EMERGENCY DEPARTMENT Provider Note   CSN: [id number] Arrival date & time: 08/26/21  1712     History  Chief Complaint  Patient presents with   Hypotension    Clinton Black is a 76 y.o. male.  The history is provided by the patient and medical records. No language interpreter was used.  Illness Location:  Fatigue for 1 year Quality:  Soft blood pressure of 90 systolic today at PCP Severity:  Moderate Onset quality:  Gradual Duration:  12 months Timing:  Constant Progression:  Waxing and waning Chronicity:  Chronic Associated symptoms: fatigue and shortness of breath (chronically)   Associated symptoms: no abdominal pain, no chest pain, no congestion, no cough, no diarrhea, no fever, no headaches, no loss of consciousness, no myalgias, no nausea, no rash, no rhinorrhea, no vomiting and no wheezing       Home Medications Prior to Admission medications   Medication Sig Start Date End Date Taking? Authorizing Provider  alfuzosin (UROXATRAL) 10 MG 24 hr tablet TAKE 1 TABLET DAILY WITH BREAKFAST. 08/14/21   Etta Grandchild, MD  atorvastatin (LIPITOR) 20 MG tablet TAKE 1 TABLET BY MOUTH EVERY DAY AT BEDTIME 08/14/21   Etta Grandchild, MD  citalopram (CELEXA) 20 MG tablet Take 1 tablet (20 mg total) by mouth daily. 12/11/20   Corwin Levins, MD  L-Methylfolate-Algae-B12-B6 [facility]) 3-90.314-2-35 MG CAPS Take 1 capsule by mouth 2 (two) times daily. 05/30/21   Etta Grandchild, MD  losartan (COZAAR) 25 MG tablet TAKE 2 TABLETS EVERY DAY 05/19/21   [place], [name], MD  memantine [facility]) 5 MG tablet Take 1 tablet (5 mg total) by mouth at bedtime. 05/07/21   Marcos Eke, PA-C  metoprolol tartrate (LOPRESSOR) 25 MG tablet Take 0.5 tablets (12.5 mg total) by mouth 2 (two) times daily. 05/29/21   Etta Grandchild, MD  pregabalin (LYRICA) 50 MG capsule Take 1 capsule (50 mg total) by mouth 3 (three) times daily. 05/29/21   Etta Grandchild, MD  rivaroxaban  (XARELTO) 20 MG TABS tablet Take 1 tablet (20 mg total) by mouth daily with supper. 05/29/21   Etta Grandchild, MD  spironolactone (ALDACTONE) 25 MG tablet TAKE 1/2 TABLET AT BEDTIME 05/19/21   Etta Grandchild, MD  thiamine 100 MG tablet Take 1 tablet (100 mg total) by mouth daily. 09/17/20   Cipriano Bunker, MD  triazolam (HALCION) 0.25 MG tablet TAKE 1 TABLET (0.25 MG TOTAL) BY MOUTH AT BEDTIME AS NEEDED. FOR SLEEP *NOT COVERED 08/02/21   Etta Grandchild, MD  vitamin B-12 (CYANOCOBALAMIN) 1000 MCG tablet Take 1 tablet (1,000 mcg total) by mouth daily. Patient taking differently: Take 2,500 mcg by mouth daily. 09/16/20   Cipriano Bunker, MD      Allergies    Quinolones    Review of Systems   Review of Systems  Constitutional:  Positive for fatigue. Negative for chills, diaphoresis and fever.  HENT:  Negative for congestion and rhinorrhea.   Respiratory:  Positive for shortness of breath (chronically). Negative for cough, chest tightness and wheezing.   Cardiovascular:  Negative for chest pain and palpitations.  Gastrointestinal:  Negative for abdominal pain, constipation, diarrhea, nausea and vomiting.  Genitourinary:  Negative for dysuria, flank pain and frequency.  Musculoskeletal:  Negative for back pain and myalgias.  Skin:  Negative for rash and wound.  Neurological:  Negative for dizziness, loss of consciousness, facial asymmetry, speech difficulty, weakness, light-headedness, numbness and headaches.  Psychiatric/Behavioral:  Negative for agitation and confusion.   All other systems reviewed and are negative.  Physical Exam Updated Vital Signs BP 135/80 (BP Location: Right Arm)   Pulse (!) 54   Temp 97.6 F (36.4 C) (Oral)   Resp 16   Ht 6\' 2"  (1.88 m)   Wt 103.4 kg   SpO2 99%   BMI 29.27 kg/m  Physical Exam Vitals and nursing note reviewed.  Constitutional:      General: He is not in acute distress.    Appearance: He is well-developed. He is not ill-appearing, toxic-appearing  or diaphoretic.  HENT:     Head: Normocephalic and atraumatic.     Nose: Nose normal. No congestion or rhinorrhea.     Mouth/Throat:     Mouth: Mucous membranes are moist.     Pharynx: No oropharyngeal exudate or posterior oropharyngeal erythema.  Eyes:     Extraocular Movements: Extraocular movements intact.     Conjunctiva/sclera: Conjunctivae normal.     Pupils: Pupils are equal, round, and reactive to light.  Cardiovascular:     Rate and Rhythm: Normal rate and regular rhythm.     Heart sounds: No murmur heard. Pulmonary:     Effort: Pulmonary effort is normal. No respiratory distress.     Breath sounds: Normal breath sounds. No wheezing, rhonchi or rales.  Chest:     Chest wall: No tenderness.  Abdominal:     General: Abdomen is flat.     Palpations: Abdomen is soft.     Tenderness: There is no abdominal tenderness. There is no right CVA tenderness, left CVA tenderness, guarding or rebound.  Musculoskeletal:        General: No swelling or tenderness.     Cervical back: Neck supple. No tenderness.  Skin:    General: Skin is warm and dry.     Capillary Refill: Capillary refill takes less than 2 seconds.     Findings: No erythema.  Neurological:     General: No focal deficit present.     Mental Status: He is alert.     Sensory: No sensory deficit.     Motor: No weakness.  Psychiatric:        Mood and Affect: Mood normal.    ED Results / Procedures / Treatments   Labs (all labs ordered are listed, but only abnormal results are displayed) Labs Reviewed  CBC WITH DIFFERENTIAL/PLATELET - Abnormal; Notable for the following components:      Result Value   Eosinophils Absolute 0.6 (*)    All other components within normal limits  COMPREHENSIVE METABOLIC PANEL - Abnormal; Notable for the following components:   AST 13 (*)    All other components within normal limits  URINALYSIS, ROUTINE W REFLEX MICROSCOPIC - Abnormal; Notable for the following components:    Leukocytes,Ua TRACE (*)    All other components within normal limits  LIPASE, BLOOD  LACTIC ACID, PLASMA  LACTIC ACID, PLASMA  CBG MONITORING, ED  TROPONIN I (HIGH SENSITIVITY)  TROPONIN I (HIGH SENSITIVITY)    EKG EKG Interpretation  Date/Time:  Tuesday August 26 2021 17:19:01 EDT Ventricular Rate:  64 PR Interval:  152 QRS Duration: 100 QT Interval:  402 QTC Calculation: 414 R Axis:   86 Text Interpretation: Normal sinus rhythm Cannot rule out Anterior infarct , age undetermined Abnormal ECG when compared to prior, similar appearance. No STEMI Confirmed by Theda Belfast ([id number]) on 08/26/2021 9:53:30 PM  Radiology DG Chest 2 View  Result Date: 08/26/2021 CLINICAL  DATA:  Weakness and hypotension.  Dizziness. 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 Heart size and pulmonary vascularity are normal. Lungs are clear. No pleural effusions. No pneumothorax. Mediastinal contours appear intact. Dilated aortic knob may indicate aneurysm. Degenerative changes in the spine. IMPRESSION: No active cardiopulmonary disease. Dilated aortic knob may indicate aneurysm. Electronically Signed   By: Burman Nieves M.D.   On: 08/26/2021 19:26    Procedures Procedures    Medications Ordered in ED Medications - No data to display  ED Course/ Medical Decision Making/ A&P                           Medical Decision Making   Clinton Black is a 76 y.o. male with a past medical history significant for aortic aneurysm status postrepair, aortic valve replacement, atrial fibrillation on Xarelto, diabetes, hypertension, CHF, and hyperlipidemia who presents for 1 year of fatigue and a soft blood pressure at PCP today.  According to patient, he has had fatigue for nearly 1 year and has had occasional episodes of shortness of breath with exertion.  He denies any chest pain or  palpitations or any shortness of breath today.  He went to PCP who found he had a blood pressure of 90 systolic so he was sent to the emergency department for evaluation.  He otherwise says symptoms have not been any different and he denies any infectious symptoms.  He denies any fevers, chills, constipation, diarrhea, urinary changes, nausea, vomiting, chest pain, or abdominal pain.  He otherwise feels at his baseline.  Upon arrival, blood pressure was not low and was trended for over 5-1/2 hours without any evidence of acute hypotension.  He denies other complaints and is feeling well.  On my exam, lungs clear and chest nontender.  Abdomen nontender.  He is moving all extremities.  He does have a slight systolic murmur.  No concerning rashes or other abnormalities on exam.  Vital signs reassuring on evaluation.  He is on room air.  Patient had work-up here to look for acute abnormality.  No evidence of acute infection with urine that did not show convincing evidence of UTI given lack of symptoms.  Chest x-ray did not show pneumonia but did show aortic aneurysm which patient is known to have.  He otherwise denies any new chest symptoms.  Labs otherwise reassuring.  Given patient stability for over 5 hours and reassuring work-up here, patient agrees with discharge home.  We discussed we could do more extensive labs such as vitamin levels and thyroid testing however as his initial labs are reassuring patient would like to go home and follow-up with his primary doctor to continue outpatient work-up for his chronic fatigue.  This was felt to be reasonable given his lack of other symptoms.  Patient had no questions or concerns and was discharged in good condition after monitoring and reassuring evaluation here.        Final Clinical Impression(s) / ED Diagnoses Final diagnoses:  Transient hypotension  Fatigue, unspecified type    Rx / DC Orders ED Discharge Orders     None       Clinical  Impression: 1. Transient hypotension   2. Fatigue, unspecified type     Disposition: Discharge  Condition: Good  I have discussed the results, Dx and Tx plan with the pt(& family if  present). He/she/they expressed understanding and agree(s) with the plan. Discharge instructions discussed at great length. Strict return precautions discussed and pt &/or family have verbalized understanding of the instructions. No further questions at time of discharge.    Discharge Medication List as of 08/26/2021 10:51 PM      Follow Up: Etta Grandchild, MD [address] [place] [address] [phone]     [facility] EMERGENCY DEPARTMENT [address] [phone] mc [place] [address] [phone]       Evalise Abruzzese, Canary Brim, MD 08/26/21 [phone]

## 2021-08-26 NOTE — Progress Notes (Signed)
? ?Subjective:  ?Patient ID: Clinton Black, male    DOB: 13-Mar-1946  Age: 76 y.o. MRN: [id number] ? ?CC: Atrial Fibrillation and Congestive Heart Failure ? ?This visit occurred during the SARS-CoV-2 public health emergency.  Safety protocols were in place, including screening questions prior to the visit, additional usage of staff PPE, and extensive cleaning of exam room while observing appropriate contact time as indicated for disinfecting solutions.   ? ?HPI ?Clinton Black presents for f/up - ? ?He complains of a 20-monthhistory of dyspnea on exertion, fatigue, excessive sleeping, and weight loss.  He tells me that he has pins-and-needles sensation in both feet that is not being controlled by Lyrica.  He feels exhausted, dizzy, and has declining memory. ? ?Outpatient Medications Prior to Visit  ?Medication Sig Dispense Refill  ? alfuzosin (UROXATRAL) 10 MG 24 hr tablet TAKE 1 TABLET DAILY WITH BREAKFAST. 90 tablet 1  ? atorvastatin (LIPITOR) 20 MG tablet TAKE 1 TABLET BY MOUTH EVERY DAY AT BEDTIME 90 tablet 1  ? citalopram (CELEXA) 20 MG tablet Take 1 tablet (20 mg total) by mouth daily. 90 tablet 3  ? L-Methylfolate-Algae-B12-B6 (METANX) 3-90.314-2-35 MG CAPS Take 1 capsule by mouth 2 (two) times daily. 180 capsule 1  ? losartan (COZAAR) 25 MG tablet TAKE 2 TABLETS EVERY DAY 180 tablet 1  ? memantine (NAMENDA) 5 MG tablet Take 1 tablet (5 mg total) by mouth at bedtime. 30 tablet 1  ? metoprolol tartrate (LOPRESSOR) 25 MG tablet Take 0.5 tablets (12.5 mg total) by mouth 2 (two) times daily. 90 tablet 0  ? pregabalin (LYRICA) 50 MG capsule Take 1 capsule (50 mg total) by mouth 3 (three) times daily. 270 capsule 1  ? rivaroxaban (XARELTO) 20 MG TABS tablet Take 1 tablet (20 mg total) by mouth daily with supper. 90 tablet 1  ? spironolactone (ALDACTONE) 25 MG tablet TAKE 1/2 TABLET AT BEDTIME 45 tablet 0  ? thiamine 100 MG tablet Take 1 tablet (100 mg total) by mouth daily. 30 tablet 1  ? triazolam (HALCION) 0.25  MG tablet TAKE 1 TABLET (0.25 MG TOTAL) BY MOUTH AT BEDTIME AS NEEDED. FOR SLEEP *NOT COVERED 30 tablet 2  ? vitamin B-12 (CYANOCOBALAMIN) 1000 MCG tablet Take 1 tablet (1,000 mcg total) by mouth daily. (Patient taking differently: Take 2,500 mcg by mouth daily.) 30 tablet 1  ? ?No facility-administered medications prior to visit.  ? ? ?ROS ?Review of Systems  ?Constitutional:  Positive for fatigue. Negative for appetite change, diaphoresis and unexpected weight change.  ?HENT: Negative.    ?Eyes: Negative.   ?Respiratory:  Positive for shortness of breath. Negative for cough, chest tightness and wheezing.   ?Cardiovascular:  Negative for chest pain, palpitations and leg swelling.  ?Gastrointestinal:  Negative for abdominal pain, constipation, diarrhea, nausea and vomiting.  ?Endocrine: Negative.   ?Genitourinary: Negative.  Negative for difficulty urinating and frequency.  ?Musculoskeletal: Negative.   ?Skin: Negative.   ?Neurological:  Positive for dizziness. Negative for weakness, light-headedness, numbness and headaches.  ?Hematological:  Negative for adenopathy. Does not bruise/bleed easily.  ?Psychiatric/Behavioral: Negative.    ? ?Objective:  ?BP (!) 90/56 (BP Location: Right Arm, Patient Position: Sitting, Cuff Size: Large)   Pulse 62   Temp 98.6 ?F (37 ?C) (Oral)   Resp 16   Ht '6\' 2"'$  (1.88 m)   Wt 228 lb (103.4 kg)   SpO2 90%   BMI 29.27 kg/m?  ? ?BP Readings from Last 3 Encounters:  ?08/26/21 117/69  ?  08/26/21 (!) 90/56  ?05/29/21 120/76  ? ? ?Wt Readings from Last 3 Encounters:  ?08/26/21 227 lb 15.3 oz (103.4 kg)  ?08/26/21 228 lb (103.4 kg)  ?05/29/21 227 lb (103 kg)  ? ? ?Physical Exam ?Constitutional:   ?   Appearance: He is ill-appearing.  ?HENT:  ?   Nose: Nose normal.  ?   Mouth/Throat:  ?   Mouth: Mucous membranes are moist.  ?Eyes:  ?   General: No scleral icterus. ?   Conjunctiva/sclera: Conjunctivae normal.  ?Cardiovascular:  ?   Rate and Rhythm: Regular rhythm. Bradycardia present.  ?    Heart sounds: Murmur heard.  ?Systolic murmur is present with a grade of 1/6.  ?No diastolic murmur is present.  ?   Comments: 1/6 SEM RUSB ? ?EKG - ?SB, 50 bpm ?Low voltage ?No Q waves ?Pulmonary:  ?   Breath sounds: No stridor. No wheezing, rhonchi or rales.  ?Abdominal:  ?   General: Abdomen is flat.  ?   Palpations: There is no mass.  ?   Tenderness: There is no abdominal tenderness. There is no guarding.  ?   Hernia: No hernia is present.  ?Musculoskeletal:     ?   General: Normal range of motion.  ?   Cervical back: Neck supple.  ?   Right lower leg: No edema.  ?   Left lower leg: No edema.  ?Lymphadenopathy:  ?   Cervical: No cervical adenopathy.  ?Skin: ?   General: Skin is warm.  ?   Coloration: Skin is pale.  ?Neurological:  ?   General: No focal deficit present.  ?   Mental Status: Mental status is at baseline.  ?Psychiatric:     ?   Mood and Affect: Mood normal.     ?   Behavior: Behavior normal.  ? ? ?Lab Results  ?Component Value Date  ? WBC 7.8 05/29/2021  ? HGB 13.9 05/29/2021  ? HCT 42.2 05/29/2021  ? PLT 184.0 05/29/2021  ? GLUCOSE 94 05/29/2021  ? CHOL 98 12/11/2020  ? TRIG 111.0 12/11/2020  ? HDL 35.60 (L) 12/11/2020  ? LDLDIRECT 78.0 05/29/2019  ? [place] 40 12/11/2020  ? ALT 9 12/11/2020  ? AST 10 12/11/2020  ? NA 142 05/29/2021  ? K 4.5 05/29/2021  ? CL 106 05/29/2021  ? CREATININE 1.16 05/29/2021  ? BUN 22 05/29/2021  ? CO2 30 05/29/2021  ? TSH 1.69 12/11/2020  ? PSA 5.18 (H) 05/29/2021  ? INR 1.4 (H) 09/15/2020  ? HGBA1C 5.8 05/29/2021  ? MICROALBUR 0.8 12/11/2020  ? ? ?MR BRAIN W WO CONTRAST ? ?Result Date: 12/26/2020 ?CLINICAL DATA:  History of cerebellar stroke EXAM: MRI HEAD WITHOUT AND WITH CONTRAST TECHNIQUE: Multiplanar, multiecho pulse sequences of the brain and surrounding structures were obtained without and with intravenous contrast. CONTRAST:  22m MULTIHANCE GADOBENATE DIMEGLUMINE 529 MG/ML IV SOLN COMPARISON:  April 2022 FINDINGS: Brain: There is no acute infarction or  intracranial hemorrhage. There is no intracranial mass, mass effect, or edema. There is no hydrocephalus or extra-axial fluid collection. Prominence of the ventricles and sulci reflects generalized parenchymal volume loss similar to the prior study. Patchy T2 hyperintensity in the supratentorial white matter is nonspecific but probably reflects stable mild chronic microvascular ischemic changes. There is a small chronic right cerebellar infarct. No abnormal enhancement. Vascular: Major vessel flow voids at the skull base are preserved. Skull and upper cervical spine: Normal marrow signal is preserved. Sinuses/Orbits: Minor mucosal thickening.  Orbits are unremarkable. Other: Sella is unremarkable.  Mastoid air cells are clear. IMPRESSION: No evidence of recent infarction, hemorrhage, or mass. No abnormal enhancement. Stable chronic microvascular ischemic changes. Small chronic right cerebellar infarct. No substantial change since the prior study. Electronically Signed   By: Macy Mis M.D.   On: 12/26/2020 16:39  ? ?EEG adult ? ?Result Date: 12/26/2020 ?Cameron Sprang, MD     01/17/2021 11:04 AM ELECTROENCEPHALOGRAM REPORT Date of Study: 12/26/2020 Patient's Name: Clinton Black MRN: [id number] Date of Birth: Aug 22, 1945 Referring Provider: Sharene Butters, PA-C Clinical History: This is a 76 year old man with rapidly progressive memory loss Medications: Uroxatral Lipitor Celexa Synjardy Cozaar Namenda Metoprolol Xarelto Aldactone Thiamine B12 Technical Summary: A multichannel digital EEG recording measured by the international 10-20 system with electrodes applied with paste and impedances below 5000 ohms performed in our laboratory with EKG monitoring in an awake and asleep patient.  Hyperventilation was not performed. Photic stimulation was performed.  The digital EEG was referentially recorded, reformatted, and digitally filtered in a variety of bipolar and referential montages for optimal display.  Description: The  patient is awake and asleep during the recording.  During maximal wakefulness, there is a symmetric, medium voltage 9-10 Hz posterior dominant rhythm that attenuates with eye opening.  There is occasional focal 4-

## 2021-08-26 NOTE — ED Provider Triage Note (Signed)
Emergency Medicine Provider Triage Evaluation Note ? ?ARSHAD OBERHOLZER , a 76 y.o. male  was evaluated in triage.  Pt complains of weakness, lightheadedness for the past few weeks.  States for the last 6 months has had decreased appetite.  No other associated symptoms reported.  Directed to the ED by PCP. ? ?Review of Systems  ?Positive: Lightheadedness, fatigue, decreased appetite ?Negative: Chest pain, shortness of breath or palpitations ? ?Physical Exam  ?BP 117/69 (BP Location: Right Arm)   Pulse 61   Temp 97.6 ?F (36.4 ?C) (Oral)   Resp 18   Ht '6\' 2"'$  (1.88 m)   Wt 103.4 kg   SpO2 98%   BMI 29.27 kg/m?  ?Gen:   Awake, no distress   ?Resp:  Normal effort  ?MSK:   Moves extremities without difficulty  ?Other:  RRR no/R/G.  Lungs CTA B ? ?Medical Decision Making  ?Medically screening exam initiated at 6:11 PM.  Appropriate orders placed.  ERYCK NEGRON was informed that the remainder of the evaluation will be completed by another provider, this initial triage assessment does not replace that evaluation, and the importance of remaining in the ED until their evaluation is complete. ? ?This chart was dictated using voice recognition software, Dragon. Despite the best efforts of this provider to proofread and correct errors, errors may still occur which can change documentation meaning. ? ?  ?Emeline Darling, PA-C ?08/26/21 1815 ? ?

## 2021-08-26 NOTE — Patient Instructions (Signed)
Atrial Fibrillation  Atrial fibrillation is a type of irregular or rapid heartbeat (arrhythmia). In atrial fibrillation, the top part of the heart (atria) beats in an irregular pattern. This makes the heart unable to pump bloodnormally and effectively. The goal of treatment is to prevent blood clots from forming, control your heart rate, or restore your heartbeat to a normal rhythm. If this condition is not treated, it can cause serious problems, such as a weakened heart muscle (cardiomyopathy) or a stroke. What are the causes? This condition is often caused by medical conditions that damage the heart's electrical system. These include: High blood pressure (hypertension). This is the most common cause. Certain heart problems or conditions, such as heart failure, coronary artery disease, heart valve problems, or heart surgery. Diabetes. Overactive thyroid (hyperthyroidism). Obesity. Chronic kidney disease. In some cases, the cause of this condition is not known. What increases the risk? This condition is more likely to develop in: Older people. People who smoke. Athletes who do endurance exercise. People who have a family history of atrial fibrillation. Men. People who use drugs. People who drink a lot of alcohol. People who have lung conditions, such as emphysema, pneumonia, or COPD. People who have obstructive sleep apnea. What are the signs or symptoms? Symptoms of this condition include: A feeling that your heart is racing or beating irregularly. Discomfort or pain in your chest. Shortness of breath. Sudden light-headedness or weakness. Tiring easily during exercise or activity. Fatigue. Syncope (fainting). Sweating. In some cases, there are no symptoms. How is this diagnosed? Your health care provider may detect atrial fibrillation when taking your pulse. If detected, this condition may be diagnosed with: An electrocardiogram (ECG) to check electrical signals of the  heart. An ambulatory cardiac monitor to record your heart's activity for a few days. A transthoracic echocardiogram (TTE) to create pictures of your heart. A transesophageal echocardiogram (TEE) to create even closer pictures of your heart. A stress test to check your blood supply while you exercise. Imaging tests, such as a CT scan or chest X-ray. Blood tests. How is this treated? Treatment depends on underlying conditions and how you feel when you experience atrial fibrillation. This condition may be treated with: Medicines to prevent blood clots or to treat heart rate or heart rhythm problems. Electrical cardioversion to reset the heart's rhythm. A pacemaker to correct abnormal heart rhythm. Ablation to remove the heart tissue that sends abnormal signals. Left atrial appendage closure to seal the area where blood clots can form. In some cases, underlying conditions will be treated. Follow these instructions at home: Medicines Take over-the counter and prescription medicines only as told by your health care provider. Do not take any new medicines without talking to your health care provider. If you are taking blood thinners: Talk with your health care provider before you take any medicines that contain aspirin or NSAIDs, such as ibuprofen. These medicines increase your risk for dangerous bleeding. Take your medicine exactly as told, at the same time every day. Avoid activities that could cause injury or bruising, and follow instructions about how to prevent falls. Wear a medical alert bracelet or carry a card that lists what medicines you take. Lifestyle     Do not use any products that contain nicotine or tobacco, such as cigarettes, e-cigarettes, and chewing tobacco. If you need help quitting, ask your health care provider. Eat heart-healthy foods. Talk with a dietitian to make an eating plan that is right for you. Exercise regularly as told by   your health care provider. Do not  drink alcohol. Lose weight if you are overweight. Do not use drugs, including cannabis. General instructions If you have obstructive sleep apnea, manage your condition as told by your health care provider. Do not use diet pills unless your health care provider approves. Diet pills can make heart problems worse. Keep all follow-up visits as told by your health care provider. This is important. Contact a health care provider if you: Notice a change in the rate, rhythm, or strength of your heartbeat. Are taking a blood thinner and you notice more bruising. Tire more easily when you exercise or do heavy work. Have a sudden change in weight. Get help right away if you have:  Chest pain, abdominal pain, sweating, or weakness. Trouble breathing. Side effects of blood thinners, such as blood in your vomit, stool, or urine, or bleeding that cannot stop. Any symptoms of a stroke. "BE FAST" is an easy way to remember the main warning signs of a stroke: B - Balance. Signs are dizziness, sudden trouble walking, or loss of balance. E - Eyes. Signs are trouble seeing or a sudden change in vision. F - Face. Signs are sudden weakness or numbness of the face, or the face or eyelid drooping on one side. A - Arms. Signs are weakness or numbness in an arm. This happens suddenly and usually on one side of the body. S - Speech. Signs are sudden trouble speaking, slurred speech, or trouble understanding what people say. T - Time. Time to call emergency services. Write down what time symptoms started. Other signs of a stroke, such as: A sudden, severe headache with no known cause. Nausea or vomiting. Seizure. These symptoms may represent a serious problem that is an emergency. Do not wait to see if the symptoms will go away. Get medical help right away. Call your local emergency services (911 in the [place] Do not drive yourself to the hospital. Summary Atrial fibrillation is a type of irregular or rapid  heartbeat (arrhythmia). Symptoms include a feeling that your heart is beating fast or irregularly. You may be given medicines to prevent blood clots or to treat heart rate or heart rhythm problems. Get help right away if you have signs or symptoms of a stroke. Get help right away if you cannot catch your breath or have chest pain or pressure. This information is not intended to replace advice given to you by your health care provider. Make sure you discuss any questions you have with your healthcare provider. Document Revised: 11/23/2018 Document Reviewed: 11/23/2018 Elsevier Patient Education  2022 [employer]vier Inc.  

## 2021-08-26 NOTE — Discharge Instructions (Signed)
Your history, exam, work-up today did not reveal any emergent or acute concerning findings that would require intervention or admission at this time.  Your labs appeared similar to prior and there is no evidence of acute infection.  While here, your blood pressures remained normal and you did not have any further hypotension.  As you describe your symptoms of ongoing for the last year and not acutely changed, we do feel you are safe for discharge home now that you have had the screening blood work that your PCP was wanting.  Make sure you are resting and staying hydrated.  Please follow-up with him in the next few days if any symptoms change or worsen acutely, please return to the nearest emergency department. ?

## 2021-08-26 NOTE — ED Triage Notes (Signed)
Pt arrived via POV from PCP office for hypotension systolic of 90. Pt was able to walk w/cane from waiting room to triage. Pt VSS. Pt states he feels a little dizzy. ?

## 2021-09-01 ENCOUNTER — Other Ambulatory Visit: Payer: Self-pay | Admitting: Internal Medicine

## 2021-09-01 ENCOUNTER — Telehealth: Payer: Self-pay

## 2021-09-01 DIAGNOSIS — E1140 Type 2 diabetes mellitus with diabetic neuropathy, unspecified: Secondary | ICD-10-CM

## 2021-09-01 MED ORDER — METANX 3-90.314-2-35 MG PO CAPS: 1.0000 | ORAL_CAPSULE | Freq: Two times a day (BID) | ORAL | 1 refills | Status: DC

## 2021-09-01 NOTE — Telephone Encounter (Signed)
Karna Christmas is calling after receiving an med list from the 08/26/21 and says pt never received the L-Methylfolate-Algae-B12-B6 ([place]) 3-90.314-2-35 MG CAPS. Pt hasn't started this medication since it was prescribed. ?Terri said that the pt was advised to stop taking metoprolol tartrate (LOPRESSOR) 25 MG tablet but cant remember the date of when it was stopped. Pt hasn't taking it in about 3 months. ? ?If L-Methylfolate-Algae-B12-B6 [facility]) [id number] MG CAPS is still needed please sent new Rx to  ?[place], [place] ? ?Please advise ?

## 2021-09-02 NOTE — Telephone Encounter (Signed)
Caller, Terri returning cmas call ? ?Advised caller of provider's recommendations ? ?Caller verbalized understanding  ?

## 2021-09-02 NOTE — Telephone Encounter (Signed)
Called Terri, LVM to discuss. ?

## 2021-09-02 NOTE — Telephone Encounter (Signed)
Noted  

## 2021-09-03 ENCOUNTER — Telehealth: Payer: Self-pay

## 2021-09-03 NOTE — Telephone Encounter (Signed)
Pt wife is requesting a refill for: ?alfuzosin (UROXATRAL) 10 MG 24 hr tablet ? ?Pharmacy: ?[facility], [place] ? ?LOV 08/26/21 ?

## 2021-09-03 NOTE — Telephone Encounter (Signed)
Pts wife Karna Christmas has been informed that Rx was sent on 3/4 to the [facility].  ?

## 2021-09-15 ENCOUNTER — Telehealth: Payer: Self-pay | Admitting: Internal Medicine

## 2021-09-15 NOTE — Telephone Encounter (Signed)
N/A-voicemail full-unable to leave a message for patient to call back to schedule Medicare Annual Wellness Visit  ? ?Last AWV  09/02/20 ? ?Please schedule at anytime with [facility] [place] if patient calls the office back.   ? ? ?Any questions, please call me at [phone]  ?

## 2021-10-17 ENCOUNTER — Telehealth: Payer: Self-pay | Admitting: Internal Medicine

## 2021-10-17 ENCOUNTER — Other Ambulatory Visit: Payer: Self-pay | Admitting: Internal Medicine

## 2021-10-17 NOTE — Telephone Encounter (Signed)
1.Medication Requested: ?memantine (NAMENDA) 5 MG tablet ?2. Pharmacy (Name, Street, [place]): ?[id number]  [phone] ?Fax:  [phone] ?  ? ?3. On Med List: yes ? ?4. Last Visit with PCP: ? ?5. Next visit date with PCP: ? ? ?Agent: Please be advised that RX refills may take up to 3 business days. We ask that you follow-up with your pharmacy.  ?

## 2021-10-20 NOTE — Telephone Encounter (Signed)
LVM to discuss ? ?Pt must request refill from specialist  ?

## 2021-11-05 ENCOUNTER — Telehealth: Payer: Self-pay | Admitting: Internal Medicine

## 2021-11-05 NOTE — Telephone Encounter (Signed)
N/A unable to leave a message for patient to call back to schedule Medicare Annual Wellness Visit - Voicemail full.  Last AWV  09/02/20  Please schedule at anytime with LB [facility] if patient calls the office back.      Any questions, please call me at [phone]

## 2021-11-25 ENCOUNTER — Other Ambulatory Visit: Payer: Self-pay | Admitting: Internal Medicine

## 2021-11-25 DIAGNOSIS — I480 Paroxysmal atrial fibrillation: Secondary | ICD-10-CM

## 2021-11-25 DIAGNOSIS — I10 Essential (primary) hypertension: Secondary | ICD-10-CM

## 2021-11-25 NOTE — Telephone Encounter (Signed)
To PCP please ?

## 2021-12-25 ENCOUNTER — Other Ambulatory Visit: Payer: Self-pay | Admitting: Internal Medicine

## 2021-12-25 DIAGNOSIS — I10 Essential (primary) hypertension: Secondary | ICD-10-CM

## 2021-12-25 DIAGNOSIS — I480 Paroxysmal atrial fibrillation: Secondary | ICD-10-CM

## 2022-01-15 ENCOUNTER — Other Ambulatory Visit: Payer: Self-pay | Admitting: Cardiology

## 2022-01-19 ENCOUNTER — Other Ambulatory Visit: Payer: Self-pay

## 2022-01-20 ENCOUNTER — Other Ambulatory Visit: Payer: Self-pay

## 2022-01-20 NOTE — Patient Outreach (Signed)
  Care Coordination   01/20/2022 Name: Clinton Black MRN: [id number] DOB: 1945/09/22   Care Coordination Outreach Attempts:  An unsuccessful telephone outreach was attempted today to offer the patient information about available care coordination services as a benefit of their health plan.   Follow Up Plan:  Additional outreach attempts will be made to offer the patient care coordination information and services.   Encounter Outcome:  No Answer  Care Coordination Interventions Activated:  No   Care Coordination Interventions:  No, not indicated    Thea Silversmith, RN, MSN, BSN, CCM Care Coordinator [phone]

## 2022-01-22 ENCOUNTER — Other Ambulatory Visit: Payer: Self-pay | Admitting: Internal Medicine

## 2022-01-22 DIAGNOSIS — I10 Essential (primary) hypertension: Secondary | ICD-10-CM

## 2022-01-22 DIAGNOSIS — I480 Paroxysmal atrial fibrillation: Secondary | ICD-10-CM

## 2022-01-27 ENCOUNTER — Ambulatory Visit (INDEPENDENT_AMBULATORY_CARE_PROVIDER_SITE_OTHER): Payer: Medicare HMO | Admitting: Internal Medicine

## 2022-01-27 ENCOUNTER — Encounter: Payer: Self-pay | Admitting: Internal Medicine

## 2022-01-27 VITALS — BP 108/78 | HR 77 | Temp 98.4°F | Resp 16 | Ht 74.0 in | Wt 219.0 lb

## 2022-01-27 DIAGNOSIS — E785 Hyperlipidemia, unspecified: Secondary | ICD-10-CM | POA: Diagnosis not present

## 2022-01-27 DIAGNOSIS — R972 Elevated prostate specific antigen [PSA]: Secondary | ICD-10-CM

## 2022-01-27 DIAGNOSIS — I7123 Aneurysm of the descending thoracic aorta, without rupture: Secondary | ICD-10-CM

## 2022-01-27 DIAGNOSIS — L97921 Non-pressure chronic ulcer of unspecified part of left lower leg limited to breakdown of skin: Secondary | ICD-10-CM

## 2022-01-27 DIAGNOSIS — I950 Idiopathic hypotension: Secondary | ICD-10-CM

## 2022-01-27 DIAGNOSIS — Z0001 Encounter for general adult medical examination with abnormal findings: Secondary | ICD-10-CM | POA: Diagnosis not present

## 2022-01-27 DIAGNOSIS — N400 Enlarged prostate without lower urinary tract symptoms: Secondary | ICD-10-CM

## 2022-01-27 DIAGNOSIS — I10 Essential (primary) hypertension: Secondary | ICD-10-CM

## 2022-01-27 DIAGNOSIS — I480 Paroxysmal atrial fibrillation: Secondary | ICD-10-CM

## 2022-01-27 DIAGNOSIS — E538 Deficiency of other specified B group vitamins: Secondary | ICD-10-CM | POA: Diagnosis not present

## 2022-01-27 LAB — BASIC METABOLIC PANEL
BUN: 24 mg/dL — ABNORMAL HIGH (ref 6–23)
CO2: 27 mEq/L (ref 19–32)
Calcium: 9.5 mg/dL (ref 8.4–10.5)
Chloride: 106 mEq/L (ref 96–112)
Creatinine, Ser: 1.14 mg/dL (ref 0.40–1.50)
GFR: 63.3 mL/min (ref >60.00)
Glucose, Bld: 99 mg/dL (ref 70–99)
Potassium: 4.4 mEq/L (ref 3.5–5.1)
Sodium: 140 mEq/L (ref 135–145)

## 2022-01-27 LAB — CBC WITH DIFFERENTIAL/PLATELET
Basophils Absolute: 0.1 10*3/uL (ref 0.0–0.1)
Basophils Relative: 0.9 % (ref 0.0–3.0)
Eosinophils Absolute: 0.6 10*3/uL (ref 0.0–0.7)
Eosinophils Relative: 8 % — ABNORMAL HIGH (ref 0.0–5.0)
HCT: 38.5 % — ABNORMAL LOW (ref 39.0–52.0)
Hemoglobin: 12.9 g/dL — ABNORMAL LOW (ref 13.0–17.0)
Lymphocytes Relative: 26 % (ref 12.0–46.0)
Lymphs Abs: 1.9 10*3/uL (ref 0.7–4.0)
MCHC: 33.4 g/dL (ref 30.0–36.0)
MCV: 90.3 fl (ref 78.0–100.0)
Monocytes Absolute: 0.4 10*3/uL (ref 0.1–1.0)
Monocytes Relative: 6 % (ref 3.0–12.0)
Neutro Abs: 4.4 10*3/uL (ref 1.4–7.7)
Neutrophils Relative %: 59.1 % (ref 43.0–77.0)
Platelets: 192 10*3/uL (ref 150.0–400.0)
RBC: 4.26 Mil/uL (ref 4.22–5.81)
RDW: 13.9 % (ref 11.5–15.5)
WBC: 7.4 10*3/uL (ref 4.0–10.5)

## 2022-01-27 LAB — HEPATIC FUNCTION PANEL
ALT: 12 U/L (ref 0–53)
AST: 14 U/L (ref 0–37)
Albumin: 4.3 g/dL (ref 3.5–5.2)
Alkaline Phosphatase: 85 U/L (ref 39–117)
Bilirubin, Direct: 0.2 mg/dL (ref 0.0–0.3)
Total Bilirubin: 1 mg/dL (ref 0.2–1.2)
Total Protein: 6.3 g/dL (ref 6.0–8.3)

## 2022-01-27 LAB — URINALYSIS, ROUTINE W REFLEX MICROSCOPIC
Bilirubin Urine: NEGATIVE
Hgb urine dipstick: NEGATIVE
Leukocytes,Ua: NEGATIVE
Nitrite: NEGATIVE
RBC / HPF: NONE SEEN (ref 0–?)
Specific Gravity, Urine: 1.01 (ref 1.000–1.030)
Urine Glucose: NEGATIVE
Urobilinogen, UA: >=8 — AB (ref 0.0–1.0)
pH: 7 (ref 5.0–8.0)

## 2022-01-27 LAB — LIPID PANEL
Cholesterol: 114 mg/dL (ref 0–200)
HDL: 39.8 mg/dL (ref >39.00)
LDL Cholesterol: 50 mg/dL (ref 0–99)
NonHDL: 74.34
Total CHOL/HDL Ratio: 3
Triglycerides: 122 mg/dL (ref 0.0–149.0)
VLDL: 24.4 mg/dL (ref 0.0–40.0)

## 2022-01-27 LAB — FOLATE: Folate: >24.2 ng/mL (ref >5.9)

## 2022-01-27 LAB — PSA: PSA: 5.13 ng/mL — ABNORMAL HIGH (ref 0.10–4.00)

## 2022-01-27 LAB — TSH: TSH: 2.73 u[IU]/mL (ref 0.35–5.50)

## 2022-01-27 LAB — VITAMIN B12: Vitamin B-12: 967 pg/mL — ABNORMAL HIGH (ref 211–911)

## 2022-01-27 LAB — CORTISOL: Cortisol, Plasma: 9.7 ug/dL

## 2022-01-27 NOTE — Progress Notes (Signed)
Subjective:  Patient ID: Clinton Black, male    DOB: 22-May-1946  Age: 76 y.o. MRN: [id number]  CC: Annual Exam, Atrial Fibrillation, Hyperlipidemia, and Hypertension   HPI Clinton Black presents for a CPX and f/up -   He complains of an ulcer/wound/growth over his LLE - this has been growing for months. He also complains of dizziness, lightheadedness, and DOE but no CP, edema, diaphoresis  Outpatient Medications Prior to Visit  Medication Sig Dispense Refill   alfuzosin (UROXATRAL) 10 MG 24 hr tablet TAKE 1 TABLET DAILY WITH BREAKFAST. 90 tablet 1   atorvastatin (LIPITOR) 20 MG tablet TAKE 1 TABLET BY MOUTH EVERY DAY AT BEDTIME 90 tablet 1   citalopram (CELEXA) 20 MG tablet TAKE 1 TABLET BY MOUTH EVERY DAY 90 tablet 0   L-Methylfolate-Algae-B12-B6 (METANX) 3-90.314-2-35 MG CAPS Take 1 capsule by mouth 2 (two) times daily. 180 capsule 1   losartan (COZAAR) 25 MG tablet Take 2 tablets (50 mg total) by mouth daily. Patient must schedule office visit for future refills first attempt 30 tablet 0   memantine (NAMENDA) 5 MG tablet Take 1 tablet (5 mg total) by mouth at bedtime. 30 tablet 1   pregabalin (LYRICA) 50 MG capsule Take 1 capsule (50 mg total) by mouth 3 (three) times daily. 270 capsule 1   rivaroxaban (XARELTO) 20 MG TABS tablet Take 1 tablet (20 mg total) by mouth daily with supper. 90 tablet 1   spironolactone (ALDACTONE) 25 MG tablet TAKE 1/2 TABLET AT BEDTIME 45 tablet 0   thiamine 100 MG tablet Take 1 tablet (100 mg total) by mouth daily. 30 tablet 1   triazolam (HALCION) 0.25 MG tablet TAKE 1 TABLET (0.25 MG TOTAL) BY MOUTH AT BEDTIME AS NEEDED. FOR SLEEP *NOT COVERED 30 tablet 2   vitamin B-12 (CYANOCOBALAMIN) 1000 MCG tablet Take 1 tablet (1,000 mcg total) by mouth daily. (Patient taking differently: Take 2,500 mcg by mouth daily.) 30 tablet 1   No facility-administered medications prior to visit.    ROS Review of Systems  Constitutional:  Positive for fatigue.  Negative for appetite change, chills, diaphoresis and unexpected weight change.  HENT: Negative.    Eyes: Negative.   Respiratory:  Positive for shortness of breath. Negative for cough, chest tightness and wheezing.   Cardiovascular:  Negative for chest pain, palpitations and leg swelling.  Gastrointestinal:  Negative for abdominal pain, constipation, diarrhea, nausea and vomiting.  Endocrine: Negative.   Genitourinary: Negative.  Negative for difficulty urinating.  Musculoskeletal: Negative.   Skin:  Positive for wound. Negative for color change.  Neurological:  Positive for dizziness and light-headedness. Negative for weakness.  Hematological:  Negative for adenopathy. Does not bruise/bleed easily.  Psychiatric/Behavioral: Negative.      Objective:  BP 108/78 (BP Location: Right Arm, Patient Position: Sitting, Cuff Size: Large)   Pulse 77   Temp 98.4 F (36.9 C) (Oral)   Resp 16   Ht '6\' 2"'$  (1.88 m)   Wt 219 lb (99.3 kg)   SpO2 93%   BMI 28.12 kg/m   BP Readings from Last 3 Encounters:  01/27/22 108/78  08/26/21 137/70  08/26/21 (!) 90/56    Wt Readings from Last 3 Encounters:  01/27/22 219 lb (99.3 kg)  08/26/21 227 lb 15.3 oz (103.4 kg)  08/26/21 228 lb (103.4 kg)    Physical Exam Vitals reviewed.  Constitutional:      General: He is not in acute distress.    Appearance: He is  ill-appearing. He is not toxic-appearing or diaphoretic.  HENT:     Mouth/Throat:     Mouth: Mucous membranes are moist.  Eyes:     General: No scleral icterus.    Conjunctiva/sclera: Conjunctivae normal.  Cardiovascular:     Rate and Rhythm: Regular rhythm. Bradycardia present.     Heart sounds: Normal heart sounds, S1 normal and S2 normal. No murmur heard.    No gallop.     Comments: EKG- SB, 59 bpm Otherwise normal EKG Pulmonary:     Effort: Pulmonary effort is normal.     Breath sounds: No stridor. No wheezing, rhonchi or rales.  Abdominal:     General: Abdomen is flat.      Palpations: There is no mass.     Tenderness: There is no abdominal tenderness. There is no guarding or rebound.     Hernia: No hernia is present.  Musculoskeletal:     Cervical back: Neck supple.     Right lower leg: No edema.     Left lower leg: No edema.  Lymphadenopathy:     Cervical: No cervical adenopathy.  Skin:    General: Skin is warm and dry.  Neurological:     General: No focal deficit present.     Mental Status: He is alert. Mental status is at baseline.  Psychiatric:        Mood and Affect: Mood normal.        Behavior: Behavior normal.     Lab Results  Component Value Date   WBC 7.4 01/27/2022   HGB 12.9 (L) 01/27/2022   HCT 38.5 (L) 01/27/2022   PLT 192.0 01/27/2022   GLUCOSE 99 01/27/2022   CHOL 114 01/27/2022   TRIG 122.0 01/27/2022   HDL 39.80 01/27/2022   LDLDIRECT 78.0 05/29/2019   LDLCALC 50 01/27/2022   ALT 12 01/27/2022   AST 14 01/27/2022   NA 140 01/27/2022   K 4.4 01/27/2022   CL 106 01/27/2022   CREATININE 1.14 01/27/2022   BUN 24 (H) 01/27/2022   CO2 27 01/27/2022   TSH 2.73 01/27/2022   PSA 5.13 (H) 01/27/2022   INR 1.4 (H) 09/15/2020   HGBA1C 5.8 05/29/2021   MICROALBUR 0.8 12/11/2020    DG Chest 2 View  Result Date: 08/26/2021 CLINICAL DATA:  Weakness and hypotension.  Dizziness. 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 Heart size and pulmonary vascularity are normal. Lungs are clear. No pleural effusions. No pneumothorax. Mediastinal contours appear intact. Dilated aortic knob may indicate aneurysm. Degenerative changes in the spine. IMPRESSION: No active cardiopulmonary disease. Dilated aortic knob may indicate aneurysm. Electronically Signed   By: Lucienne Capers M.D.   On: 08/26/2021 19:26    Review of the MIP images confirms the above findings.   IMPRESSION: 1. 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 2. 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 3. 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 4. Coronary artery disease with calcified plaque in the distribution of the proximal LAD.     Electronically Signed   By: Aletta Edouard M.D.   On: 07/15/2020 13:09   Assessment & Plan:   Monterio was seen today  for annual exam, atrial fibrillation, hyperlipidemia and hypertension.  Diagnoses and all orders for this visit:  PAF (paroxysmal atrial fibrillation) ([place])- He has good rate/rhythm control. -     TSH; Future -     EKG 12-Lead -     TSH  Hyperlipidemia LDL goal <130- LDL goal achieved. Doing well on the statin  -     Lipid panel; Future -     TSH; Future -     Hepatic function panel; Future -     Hepatic function panel -     TSH -     Lipid panel  Encounter for general adult medical examination with abnormal findings- exam completed, labs reviewed, vaccines reviewed and updated, cancer screenings are UTD, pt ed material was given.  Ulcer of left lower extremity, limited to breakdown of skin ([place]) -     Ambulatory referral to Plastic Surgery  Aneurysm of descending thoracic aorta without rupture (HCC) -     Cancel: CT ANGIO CHEST AORTA W/ & OR WO/CM & GATING ([place] ONLY); Future -     CT ANGIO CHEST AORTA W/CM & OR WO/CM; Future  BPH with elevated PSA- His PSA is not rising. -     Urinalysis, Routine w reflex microscopic; Future -     PSA; Future -     PSA -     Urinalysis, Routine w reflex microscopic  Benign prostatic hyperplasia without lower urinary tract symptoms -     PSA; Future -     PSA  B12 deficiency- Will cont B12 replacement therapy. -     Folate; Future -     CBC  with Differential/Platelet; Future -     Vitamin B12; Future -     Vitamin B12 -     CBC with Differential/Platelet -     Folate  Essential hypertension- His BP is well controlled. -     Basic metabolic panel; Future -     EKG 12-Lead -     Basic metabolic panel  Idiopathic hypotension- Labs are negative for secondary causes. -     Cortisol; Future -     Cortisol   I am having [name] maintain his thiamine, cyanocobalamin, memantine, spironolactone, rivaroxaban, pregabalin, triazolam, alfuzosin, atorvastatin, Metanx, citalopram, and losartan.  No orders of the defined types were placed in this encounter.    Follow-up: Return in about 3 months (around 04/29/2022).  Scarlette Calico, MD

## 2022-01-27 NOTE — Patient Instructions (Signed)
Health Maintenance, Male Adopting a healthy lifestyle and getting preventive care are important in promoting health and wellness. Ask your health care provider about: The right schedule for you to have regular tests and exams. Things you can do on your own to prevent diseases and keep yourself healthy. What should I know about diet, weight, and exercise? Eat a healthy diet  Eat a diet that includes plenty of vegetables, fruits, low-fat dairy products, and lean protein. Do not eat a lot of foods that are high in solid fats, added sugars, or sodium. Maintain a healthy weight Body mass index (BMI) is a measurement that can be used to identify possible weight problems. It estimates body fat based on height and weight. Your health care provider can help determine your BMI and help you achieve or maintain a healthy weight. Get regular exercise Get regular exercise. This is one of the most important things you can do for your health. Most adults should: Exercise for at least 150 minutes each week. The exercise should increase your heart rate and make you sweat (moderate-intensity exercise). Do strengthening exercises at least twice a week. This is in addition to the moderate-intensity exercise. Spend less time sitting. Even light physical activity can be beneficial. Watch cholesterol and blood lipids Have your blood tested for lipids and cholesterol at 75 years of age, then have this test every 5 years. You may need to have your cholesterol levels checked more often if: Your lipid or cholesterol levels are high. You are older than 75 years of age. You are at high risk for heart disease. What should I know about cancer screening? Many types of cancers can be detected early and may often be prevented. Depending on your health history and family history, you may need to have cancer screening at various ages. This may include screening for: Colorectal cancer. Prostate cancer. Skin cancer. Lung  cancer. What should I know about heart disease, diabetes, and high blood pressure? Blood pressure and heart disease High blood pressure causes heart disease and increases the risk of stroke. This is more likely to develop in people who have high blood pressure readings or are overweight. Talk with your health care provider about your target blood pressure readings. Have your blood pressure checked: Every 3-5 years if you are 18-39 years of age. Every year if you are 40 years old or older. If you are between the ages of 65 and 75 and are a current or former smoker, ask your health care provider if you should have a one-time screening for abdominal aortic aneurysm (AAA). Diabetes Have regular diabetes screenings. This checks your fasting blood sugar level. Have the screening done: Once every three years after age 45 if you are at a normal weight and have a low risk for diabetes. More often and at a younger age if you are overweight or have a high risk for diabetes. What should I know about preventing infection? Hepatitis B If you have a higher risk for hepatitis B, you should be screened for this virus. Talk with your health care provider to find out if you are at risk for hepatitis B infection. Hepatitis C Blood testing is recommended for: Everyone born from 1945 through 1965. Anyone with known risk factors for hepatitis C. Sexually transmitted infections (STIs) You should be screened each year for STIs, including gonorrhea and chlamydia, if: You are sexually active and are younger than 75 years of age. You are older than 75 years of age and your   health care provider tells you that you are at risk for this type of infection. Your sexual activity has changed since you were last screened, and you are at increased risk for chlamydia or gonorrhea. Ask your health care provider if you are at risk. Ask your health care provider about whether you are at high risk for HIV. Your health care provider  may recommend a prescription medicine to help prevent HIV infection. If you choose to take medicine to prevent HIV, you should first get tested for HIV. You should then be tested every 3 months for as long as you are taking the medicine. Follow these instructions at home: Alcohol use Do not drink alcohol if your health care provider tells you not to drink. If you drink alcohol: Limit how much you have to 0-2 drinks a day. Know how much alcohol is in your drink. In the [place] one drink equals one 12 oz bottle of beer (355 mL), one 5 oz glass of wine (148 mL), or one 1 oz glass of hard liquor (44 mL). Lifestyle Do not use any products that contain nicotine or tobacco. These products include cigarettes, chewing tobacco, and vaping devices, such as e-cigarettes. If you need help quitting, ask your health care provider. Do not use street drugs. Do not share needles. Ask your health care provider for help if you need support or information about quitting drugs. General instructions Schedule regular health, dental, and eye exams. Stay current with your vaccines. Tell your health care provider if: You often feel depressed. You have ever been abused or do not feel safe at home. Summary Adopting a healthy lifestyle and getting preventive care are important in promoting health and wellness. Follow your health care provider's instructions about healthy diet, exercising, and getting tested or screened for diseases. Follow your health care provider's instructions on monitoring your cholesterol and blood pressure. This information is not intended to replace advice given to you by your health care provider. Make sure you discuss any questions you have with your health care provider. Document Revised: 10/21/2020 Document Reviewed: 10/21/2020 Elsevier Patient Education  2023 [employer]  

## 2022-02-02 ENCOUNTER — Other Ambulatory Visit: Payer: Self-pay

## 2022-02-02 MED ORDER — SHINGRIX 50 MCG/0.5ML IM SUSR: 0.5000 mL | Freq: Once | INTRAMUSCULAR | 1 refills | Status: AC

## 2022-02-02 NOTE — Patient Outreach (Signed)
  Care Coordination   02/02/2022 Name: Clinton Black MRN: [id number] DOB: 1946-02-25   Care Coordination Outreach Attempts:  A second unsuccessful outreach was attempted today to offer the patient with information about available care coordination services as a benefit of their health plan.     Follow Up Plan:  Additional outreach attempts will be made to offer the patient care coordination information and services.   Encounter Outcome:  No Answer  Care Coordination Interventions Activated:  No   Care Coordination Interventions:  No, not indicated    Thea Silversmith, RN, MSN, BSN, CCM Care Coordinator [phone]

## 2022-02-09 ENCOUNTER — Other Ambulatory Visit: Payer: Self-pay | Admitting: Internal Medicine

## 2022-02-09 DIAGNOSIS — I10 Essential (primary) hypertension: Secondary | ICD-10-CM

## 2022-02-09 DIAGNOSIS — E1140 Type 2 diabetes mellitus with diabetic neuropathy, unspecified: Secondary | ICD-10-CM

## 2022-02-09 DIAGNOSIS — I480 Paroxysmal atrial fibrillation: Secondary | ICD-10-CM

## 2022-02-10 ENCOUNTER — Ambulatory Visit: Payer: Self-pay

## 2022-02-10 NOTE — Patient Outreach (Signed)
  Care Coordination   02/10/2022 Name: Clinton Black MRN: [id number] DOB: [birth year]   Care Coordination Outreach Attempts:  A third unsuccessful outreach was attempted today to offer the patient with information about available care coordination services as a benefit of their health plan.   Follow Up Plan:  No further outreach attempts will be made at this time. We have been unable to contact the patient to offer or enroll patient in care coordination services  Encounter Outcome:  No Answer  Care Coordination Interventions Activated:  No   Care Coordination Interventions:  No, not indicated    Thea Silversmith, RN, MSN, BSN, CCM Care Coordinator [phone]

## 2022-02-13 ENCOUNTER — Other Ambulatory Visit: Payer: Self-pay | Admitting: Internal Medicine

## 2022-02-13 DIAGNOSIS — E1140 Type 2 diabetes mellitus with diabetic neuropathy, unspecified: Secondary | ICD-10-CM

## 2022-02-19 ENCOUNTER — Other Ambulatory Visit: Payer: Self-pay | Admitting: Internal Medicine

## 2022-02-19 DIAGNOSIS — G4700 Insomnia, unspecified: Secondary | ICD-10-CM

## 2022-02-19 DIAGNOSIS — I480 Paroxysmal atrial fibrillation: Secondary | ICD-10-CM

## 2022-02-19 DIAGNOSIS — F32A Depression, unspecified: Secondary | ICD-10-CM

## 2022-03-03 ENCOUNTER — Ambulatory Visit: Payer: Medicare HMO | Admitting: Internal Medicine

## 2022-03-09 ENCOUNTER — Other Ambulatory Visit: Payer: Self-pay | Admitting: Cardiology

## 2022-03-10 ENCOUNTER — Other Ambulatory Visit: Payer: Self-pay | Admitting: Internal Medicine

## 2022-03-10 ENCOUNTER — Other Ambulatory Visit: Payer: Self-pay | Admitting: Emergency Medicine

## 2022-03-10 DIAGNOSIS — E1140 Type 2 diabetes mellitus with diabetic neuropathy, unspecified: Secondary | ICD-10-CM

## 2022-03-10 DIAGNOSIS — F419 Anxiety disorder, unspecified: Secondary | ICD-10-CM

## 2022-03-10 DIAGNOSIS — I10 Essential (primary) hypertension: Secondary | ICD-10-CM

## 2022-03-10 DIAGNOSIS — I480 Paroxysmal atrial fibrillation: Secondary | ICD-10-CM

## 2022-03-10 NOTE — Telephone Encounter (Signed)
Send to PCP Dr. Ronnald Ramp please.

## 2022-03-12 ENCOUNTER — Other Ambulatory Visit: Payer: Self-pay | Admitting: Internal Medicine

## 2022-03-31 ENCOUNTER — Other Ambulatory Visit: Payer: Self-pay | Admitting: Cardiology

## 2022-04-03 ENCOUNTER — Other Ambulatory Visit: Payer: Self-pay | Admitting: Internal Medicine

## 2022-04-04 ENCOUNTER — Other Ambulatory Visit: Payer: Self-pay | Admitting: Internal Medicine

## 2022-04-06 ENCOUNTER — Telehealth: Payer: Self-pay | Admitting: Internal Medicine

## 2022-04-06 NOTE — Telephone Encounter (Signed)
Called to schedule AWV with [facility], but voice mail box was full.

## 2022-04-07 ENCOUNTER — Other Ambulatory Visit: Payer: Self-pay | Admitting: Physician Assistant

## 2022-04-20 ENCOUNTER — Telehealth: Payer: Self-pay | Admitting: Internal Medicine

## 2022-04-20 NOTE — Telephone Encounter (Signed)
No answer when calling to schedule AWV with [facility]. Please schedule at next office visit

## 2022-04-29 ENCOUNTER — Encounter: Payer: Self-pay | Admitting: Internal Medicine

## 2022-04-29 ENCOUNTER — Ambulatory Visit (INDEPENDENT_AMBULATORY_CARE_PROVIDER_SITE_OTHER): Payer: Medicare HMO | Admitting: Internal Medicine

## 2022-04-29 VITALS — BP 118/68 | HR 50 | Temp 97.6°F | Resp 16 | Ht 74.0 in | Wt 232.5 lb

## 2022-04-29 DIAGNOSIS — Z23 Encounter for immunization: Secondary | ICD-10-CM

## 2022-04-29 DIAGNOSIS — L97921 Non-pressure chronic ulcer of unspecified part of left lower leg limited to breakdown of skin: Secondary | ICD-10-CM

## 2022-04-29 DIAGNOSIS — I5032 Chronic diastolic (congestive) heart failure: Secondary | ICD-10-CM

## 2022-04-29 DIAGNOSIS — L989 Disorder of the skin and subcutaneous tissue, unspecified: Secondary | ICD-10-CM | POA: Diagnosis not present

## 2022-04-29 DIAGNOSIS — D539 Nutritional anemia, unspecified: Secondary | ICD-10-CM | POA: Diagnosis not present

## 2022-04-29 DIAGNOSIS — E118 Type 2 diabetes mellitus with unspecified complications: Secondary | ICD-10-CM

## 2022-04-29 LAB — CBC WITH DIFFERENTIAL/PLATELET
Basophils Absolute: 0.1 10*3/uL (ref 0.0–0.1)
Basophils Relative: 0.8 % (ref 0.0–3.0)
Eosinophils Absolute: 0.7 10*3/uL (ref 0.0–0.7)
Eosinophils Relative: 8.9 % — ABNORMAL HIGH (ref 0.0–5.0)
HCT: 37.4 % — ABNORMAL LOW (ref 39.0–52.0)
Hemoglobin: 12.7 g/dL — ABNORMAL LOW (ref 13.0–17.0)
Lymphocytes Relative: 23.1 % (ref 12.0–46.0)
Lymphs Abs: 1.9 10*3/uL (ref 0.7–4.0)
MCHC: 33.9 g/dL (ref 30.0–36.0)
MCV: 90.6 fl (ref 78.0–100.0)
Monocytes Absolute: 0.6 10*3/uL (ref 0.1–1.0)
Monocytes Relative: 7.4 % (ref 3.0–12.0)
Neutro Abs: 4.8 10*3/uL (ref 1.4–7.7)
Neutrophils Relative %: 59.8 % (ref 43.0–77.0)
Platelets: 173 10*3/uL (ref 150.0–400.0)
RBC: 4.13 Mil/uL — ABNORMAL LOW (ref 4.22–5.81)
RDW: 14.2 % (ref 11.5–15.5)
WBC: 8 10*3/uL (ref 4.0–10.5)

## 2022-04-29 LAB — IBC + FERRITIN
Ferritin: 96.9 ng/mL (ref 22.0–322.0)
Iron: 54 ug/dL (ref 42–165)
Saturation Ratios: 17.6 % — ABNORMAL LOW (ref 20.0–50.0)
TIBC: 306.6 ug/dL (ref 250.0–450.0)
Transferrin: 219 mg/dL (ref 212.0–360.0)

## 2022-04-29 LAB — BRAIN NATRIURETIC PEPTIDE: Pro B Natriuretic peptide (BNP): 78 pg/mL (ref 0.0–100.0)

## 2022-04-29 LAB — RETICULOCYTES
ABS Retic: 48720 cells/uL (ref 25000–90000)
Retic Ct Pct: 1.2 %

## 2022-04-29 LAB — HEMOGLOBIN A1C: Hgb A1c MFr Bld: 6.1 % (ref 4.6–6.5)

## 2022-04-29 NOTE — Progress Notes (Signed)
Subjective:  Patient ID: Clinton Black, male    DOB: February 28, 1946  Age: 76 y.o. MRN: [id number]  CC: Anemia and Diabetes   HPI Clinton Black presents for f/up -  He walks about 1.5 miles a day.  He only experiences dyspnea on exertion when he is on a steep incline.  He has rare dizziness but denies chest pain, diaphoresis, or edema.  He complains of a growing lesion on his left lower extremity.  This was previously an ulcer.  Outpatient Medications Prior to Visit  Medication Sig Dispense Refill   alfuzosin (UROXATRAL) 10 MG 24 hr tablet TAKE 1 TABLET DAILY WITH BREAKFAST 90 tablet 1   atorvastatin (LIPITOR) 20 MG tablet TAKE 1 TABLET BY MOUTH EVERY DAY AT BEDTIME 90 tablet 1   citalopram (CELEXA) 20 MG tablet TAKE 1 TABLET BY MOUTH EVERY DAY 90 tablet 0   L-Methylfolate-Algae-B12-B6 (FOLTANX RF) 3-90.314-2-35 MG CAPS TAKE 1 CAPSULE BY MOUTH TWICE A DAY 180 capsule 0   losartan (COZAAR) 25 MG tablet Take 1 tablet (25 mg total) by mouth daily. Schedule an appointment for further refills, 2nd attempt 60 tablet 0   memantine (NAMENDA) 5 MG tablet TAKE 1 TABLET BY MOUTH EVERYDAY AT BEDTIME 30 tablet 1   metoprolol tartrate (LOPRESSOR) 25 MG tablet TAKE 1/2 TABLET TWICE A DAY BY MOUTH 90 tablet 0   pregabalin (LYRICA) 50 MG capsule TAKE 1 CAPSULE BY MOUTH 3 TIMES DAILY. 270 capsule 0   spironolactone (ALDACTONE) 25 MG tablet TAKE 1/2 TABLET AT BEDTIME 45 tablet 0   thiamine 100 MG tablet Take 1 tablet (100 mg total) by mouth daily. 30 tablet 1   triazolam (HALCION) 0.25 MG tablet TAKE 1 TABLET (0.25 MG TOTAL) BY MOUTH AT BEDTIME AS NEEDED. FOR SLEEP *NOT COVERED 90 tablet 0   vitamin B-12 (CYANOCOBALAMIN) 1000 MCG tablet Take 1 tablet (1,000 mcg total) by mouth daily. (Patient taking differently: Take 2,500 mcg by mouth daily.) 30 tablet 1   XARELTO 20 MG TABS tablet TAKE 1 TABLET BY MOUTH DAILY WITH SUPPER. 90 tablet 0   No facility-administered medications prior to visit.     ROS Review of Systems  Constitutional:  Negative for chills, diaphoresis, fatigue and fever.  HENT: Negative.    Eyes: Negative.   Respiratory:  Positive for shortness of breath. Negative for cough, chest tightness and wheezing.   Cardiovascular:  Negative for chest pain, palpitations and leg swelling.  Gastrointestinal:  Negative for abdominal pain, constipation, diarrhea, nausea and vomiting.  Endocrine: Negative.   Genitourinary: Negative.  Negative for difficulty urinating.  Musculoskeletal: Negative.   Skin: Negative.   Neurological:  Positive for dizziness. Negative for weakness and light-headedness.  Hematological:  Negative for adenopathy. Does not bruise/bleed easily.  Psychiatric/Behavioral: Negative.  Negative for hallucinations.     Objective:  BP 118/68 (BP Location: Left Arm, Patient Position: Sitting, Cuff Size: Normal)   Pulse (!) 50   Temp 97.6 F (36.4 C) (Oral)   Resp 16   Ht '6\' 2"'$  (1.88 m)   Wt 232 lb 8 oz (105.5 kg)   SpO2 97%   BMI 29.85 kg/m   BP Readings from Last 3 Encounters:  04/29/22 118/68  01/27/22 108/78  08/26/21 137/70    Wt Readings from Last 3 Encounters:  04/29/22 232 lb 8 oz (105.5 kg)  01/27/22 219 lb (99.3 kg)  08/26/21 227 lb 15.3 oz (103.4 kg)    Physical Exam Vitals reviewed.  Eyes:  General: No scleral icterus. Cardiovascular:     Rate and Rhythm: Regular rhythm. Bradycardia present.     Heart sounds: Murmur heard.     Systolic murmur is present with a grade of 2/6.     No diastolic murmur is present.     No friction rub. No gallop.  Pulmonary:     Effort: Pulmonary effort is normal.     Breath sounds: No stridor. No wheezing, rhonchi or rales.  Abdominal:     General: Abdomen is flat.     Palpations: There is no mass.     Tenderness: There is no abdominal tenderness. There is no guarding.     Hernia: No hernia is present.  Musculoskeletal:        General: Normal range of motion.     Cervical back: Neck  supple.     Right lower leg: No edema.     Left lower leg: No edema.  Lymphadenopathy:     Cervical: No cervical adenopathy.  Neurological:     Mental Status: He is alert. Mental status is at baseline.     Motor: Weakness present.     Gait: Gait abnormal.  Psychiatric:        Mood and Affect: Mood normal.        Behavior: Behavior normal.     Lab Results  Component Value Date   WBC 8.0 04/29/2022   HGB 12.7 (L) 04/29/2022   HCT 37.4 (L) 04/29/2022   PLT 173.0 04/29/2022   GLUCOSE 99 01/27/2022   CHOL 114 01/27/2022   TRIG 122.0 01/27/2022   HDL 39.80 01/27/2022   LDLDIRECT 78.0 05/29/2019   LDLCALC 50 01/27/2022   ALT 12 01/27/2022   AST 14 01/27/2022   NA 140 01/27/2022   K 4.4 01/27/2022   CL 106 01/27/2022   CREATININE 1.14 01/27/2022   BUN 24 (H) 01/27/2022   CO2 27 01/27/2022   TSH 2.73 01/27/2022   PSA 5.13 (H) 01/27/2022   INR 1.4 (H) 09/15/2020   HGBA1C 6.1 04/29/2022   MICROALBUR 0.8 12/11/2020    DG Chest 2 View  Result Date: 08/26/2021 CLINICAL DATA:  Weakness and hypotension.  Dizziness. 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 Heart size and pulmonary vascularity are normal. Lungs are clear. No pleural effusions. No pneumothorax. Mediastinal contours appear intact. Dilated aortic knob may indicate aneurysm. Degenerative changes in the spine. IMPRESSION: No active cardiopulmonary disease. Dilated aortic knob may indicate aneurysm. Electronically Signed   By: Lucienne Capers M.D.   On: 08/26/2021 19:26    Assessment & Plan:   Clinton Black was seen today for anemia and diabetes.  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 -     IBC + Ferritin; Future -     Vitamin B1; Future -     Zinc; Future -      Reticulocytes; Future -     CBC with Differential/Platelet; Future -     CBC with Differential/Platelet -     Reticulocytes -     Zinc -     Vitamin B1 -     IBC + Ferritin  Type 2 diabetes mellitus with complication, without long-term current use of insulin ([place])- His blood sugar is very well-controlled. -  Hemoglobin A1c; Future -     HM Diabetes Foot Exam -     Hemoglobin A1c  Leg skin lesion, left -     Ambulatory referral to Plastic Surgery  Chronic diastolic CHF (congestive heart failure), NYHA class 2 ([place])- He has a normal volume status. -     Brain natriuretic peptide; Future -     Brain natriuretic peptide -     empagliflozin (JARDIANCE) 10 MG TABS tablet; Take 1 tablet (10 mg total) by mouth daily before breakfast.  Ulcer of left lower extremity, limited to breakdown of skin ([place])- I have asked him to see plastic surgery.   I am having [name] start on empagliflozin. I am also having him maintain his thiamine, cyanocobalamin, spironolactone, atorvastatin, Xarelto, triazolam, Foltanx RF, metoprolol tartrate, pregabalin, citalopram, alfuzosin, losartan, and memantine.  Meds ordered this encounter  Medications   empagliflozin (JARDIANCE) 10 MG TABS tablet    Sig: Take 1 tablet (10 mg total) by mouth daily before breakfast.    Dispense:  90 tablet    Refill:  1     Follow-up: Return in about 3 months (around 07/30/2022).  Scarlette Calico, MD

## 2022-04-29 NOTE — Patient Instructions (Signed)

## 2022-05-01 LAB — ZINC: Zinc: 74 ug/dL (ref 60–130)

## 2022-05-01 MED ORDER — EMPAGLIFLOZIN 10 MG PO TABS: 10.0000 mg | ORAL_TABLET | Freq: Every day | ORAL | 1 refills | Status: AC

## 2022-05-01 MED ORDER — SHINGRIX 50 MCG/0.5ML IM SUSR: 0.5000 mL | Freq: Once | INTRAMUSCULAR | 1 refills | Status: AC

## 2022-05-01 MED ORDER — BOOSTRIX 5-2.5-18.5 LF-MCG/0.5 IM SUSP: 0.5000 mL | Freq: Once | INTRAMUSCULAR | 0 refills | Status: AC

## 2022-05-05 LAB — VITAMIN B1: Vitamin B1 (Thiamine): 114 nmol/L — ABNORMAL HIGH (ref 8–30)

## 2022-05-06 ENCOUNTER — Other Ambulatory Visit: Payer: Self-pay | Admitting: Internal Medicine

## 2022-05-09 ENCOUNTER — Other Ambulatory Visit: Payer: Self-pay | Admitting: Internal Medicine

## 2022-05-09 DIAGNOSIS — E1140 Type 2 diabetes mellitus with diabetic neuropathy, unspecified: Secondary | ICD-10-CM

## 2022-05-19 DIAGNOSIS — I10 Essential (primary) hypertension: Secondary | ICD-10-CM | POA: Diagnosis not present

## 2022-05-19 DIAGNOSIS — G4730 Sleep apnea, unspecified: Secondary | ICD-10-CM | POA: Diagnosis not present

## 2022-05-19 DIAGNOSIS — G4700 Insomnia, unspecified: Secondary | ICD-10-CM | POA: Diagnosis not present

## 2022-05-19 DIAGNOSIS — E538 Deficiency of other specified B group vitamins: Secondary | ICD-10-CM | POA: Diagnosis not present

## 2022-05-19 DIAGNOSIS — I5032 Chronic diastolic (congestive) heart failure: Secondary | ICD-10-CM | POA: Diagnosis not present

## 2022-05-19 DIAGNOSIS — I4819 Other persistent atrial fibrillation: Secondary | ICD-10-CM | POA: Diagnosis not present

## 2022-05-19 DIAGNOSIS — L97921 Non-pressure chronic ulcer of unspecified part of left lower leg limited to breakdown of skin: Secondary | ICD-10-CM | POA: Diagnosis not present

## 2022-05-19 DIAGNOSIS — I712 Thoracic aortic aneurysm, without rupture, unspecified: Secondary | ICD-10-CM | POA: Diagnosis not present

## 2022-05-19 DIAGNOSIS — E11622 Type 2 diabetes mellitus with other skin ulcer: Secondary | ICD-10-CM | POA: Diagnosis not present

## 2022-05-20 ENCOUNTER — Other Ambulatory Visit: Payer: Self-pay | Admitting: Internal Medicine

## 2022-05-20 DIAGNOSIS — I480 Paroxysmal atrial fibrillation: Secondary | ICD-10-CM

## 2022-05-20 DIAGNOSIS — G4700 Insomnia, unspecified: Secondary | ICD-10-CM

## 2022-05-20 DIAGNOSIS — F32A Depression, unspecified: Secondary | ICD-10-CM

## 2022-05-20 DIAGNOSIS — I10 Essential (primary) hypertension: Secondary | ICD-10-CM

## 2022-05-24 ENCOUNTER — Other Ambulatory Visit: Payer: Self-pay | Admitting: Internal Medicine

## 2022-05-24 DIAGNOSIS — E785 Hyperlipidemia, unspecified: Secondary | ICD-10-CM

## 2022-05-24 DIAGNOSIS — E118 Type 2 diabetes mellitus with unspecified complications: Secondary | ICD-10-CM

## 2022-05-25 ENCOUNTER — Other Ambulatory Visit: Payer: Self-pay | Admitting: Cardiology

## 2022-05-27 DIAGNOSIS — I110 Hypertensive heart disease with heart failure: Secondary | ICD-10-CM | POA: Diagnosis not present

## 2022-05-27 DIAGNOSIS — E1140 Type 2 diabetes mellitus with diabetic neuropathy, unspecified: Secondary | ICD-10-CM | POA: Diagnosis not present

## 2022-05-27 DIAGNOSIS — E559 Vitamin D deficiency, unspecified: Secondary | ICD-10-CM | POA: Diagnosis not present

## 2022-05-27 DIAGNOSIS — S81802D Unspecified open wound, left lower leg, subsequent encounter: Secondary | ICD-10-CM | POA: Diagnosis not present

## 2022-05-27 DIAGNOSIS — I4819 Other persistent atrial fibrillation: Secondary | ICD-10-CM | POA: Diagnosis not present

## 2022-05-27 DIAGNOSIS — I5032 Chronic diastolic (congestive) heart failure: Secondary | ICD-10-CM | POA: Diagnosis not present

## 2022-05-27 DIAGNOSIS — I712 Thoracic aortic aneurysm, without rupture, unspecified: Secondary | ICD-10-CM | POA: Diagnosis not present

## 2022-05-27 DIAGNOSIS — E785 Hyperlipidemia, unspecified: Secondary | ICD-10-CM | POA: Diagnosis not present

## 2022-05-27 DIAGNOSIS — E1151 Type 2 diabetes mellitus with diabetic peripheral angiopathy without gangrene: Secondary | ICD-10-CM | POA: Diagnosis not present

## 2022-06-15 DIAGNOSIS — I4819 Other persistent atrial fibrillation: Secondary | ICD-10-CM | POA: Diagnosis not present

## 2022-06-15 DIAGNOSIS — M5136 Other intervertebral disc degeneration, lumbar region: Secondary | ICD-10-CM | POA: Diagnosis not present

## 2022-06-15 DIAGNOSIS — Z952 Presence of prosthetic heart valve: Secondary | ICD-10-CM | POA: Diagnosis not present

## 2022-06-15 DIAGNOSIS — Z8673 Personal history of transient ischemic attack (TIA), and cerebral infarction without residual deficits: Secondary | ICD-10-CM | POA: Diagnosis not present

## 2022-06-15 DIAGNOSIS — M1990 Unspecified osteoarthritis, unspecified site: Secondary | ICD-10-CM | POA: Diagnosis not present

## 2022-06-15 DIAGNOSIS — Z9181 History of falling: Secondary | ICD-10-CM | POA: Diagnosis not present

## 2022-06-15 DIAGNOSIS — F0390 Unspecified dementia without behavioral disturbance: Secondary | ICD-10-CM | POA: Diagnosis not present

## 2022-06-15 DIAGNOSIS — Z6826 Body mass index (BMI) 26.0-26.9, adult: Secondary | ICD-10-CM | POA: Diagnosis not present

## 2022-06-15 DIAGNOSIS — E559 Vitamin D deficiency, unspecified: Secondary | ICD-10-CM | POA: Diagnosis not present

## 2022-06-15 DIAGNOSIS — E539 Vitamin B deficiency, unspecified: Secondary | ICD-10-CM | POA: Diagnosis not present

## 2022-06-15 DIAGNOSIS — S81802D Unspecified open wound, left lower leg, subsequent encounter: Secondary | ICD-10-CM | POA: Diagnosis not present

## 2022-06-15 DIAGNOSIS — M5030 Other cervical disc degeneration, unspecified cervical region: Secondary | ICD-10-CM | POA: Diagnosis not present

## 2022-06-15 DIAGNOSIS — E1140 Type 2 diabetes mellitus with diabetic neuropathy, unspecified: Secondary | ICD-10-CM | POA: Diagnosis not present

## 2022-06-15 DIAGNOSIS — I712 Thoracic aortic aneurysm, without rupture, unspecified: Secondary | ICD-10-CM | POA: Diagnosis not present

## 2022-06-15 DIAGNOSIS — I110 Hypertensive heart disease with heart failure: Secondary | ICD-10-CM | POA: Diagnosis not present

## 2022-06-15 DIAGNOSIS — I5032 Chronic diastolic (congestive) heart failure: Secondary | ICD-10-CM | POA: Diagnosis not present

## 2022-06-15 DIAGNOSIS — N400 Enlarged prostate without lower urinary tract symptoms: Secondary | ICD-10-CM | POA: Diagnosis not present

## 2022-06-15 DIAGNOSIS — E1151 Type 2 diabetes mellitus with diabetic peripheral angiopathy without gangrene: Secondary | ICD-10-CM | POA: Diagnosis not present

## 2022-06-15 DIAGNOSIS — E785 Hyperlipidemia, unspecified: Secondary | ICD-10-CM | POA: Diagnosis not present

## 2022-06-15 DIAGNOSIS — Z7901 Long term (current) use of anticoagulants: Secondary | ICD-10-CM | POA: Diagnosis not present

## 2022-06-15 DIAGNOSIS — Z7984 Long term (current) use of oral hypoglycemic drugs: Secondary | ICD-10-CM | POA: Diagnosis not present

## 2022-07-02 DIAGNOSIS — L03116 Cellulitis of left lower limb: Secondary | ICD-10-CM | POA: Diagnosis not present

## 2022-07-02 DIAGNOSIS — L97921 Non-pressure chronic ulcer of unspecified part of left lower leg limited to breakdown of skin: Secondary | ICD-10-CM | POA: Diagnosis not present

## 2022-07-02 DIAGNOSIS — I480 Paroxysmal atrial fibrillation: Secondary | ICD-10-CM | POA: Diagnosis not present

## 2022-07-02 DIAGNOSIS — I712 Thoracic aortic aneurysm, without rupture, unspecified: Secondary | ICD-10-CM | POA: Diagnosis not present

## 2022-07-02 DIAGNOSIS — E1140 Type 2 diabetes mellitus with diabetic neuropathy, unspecified: Secondary | ICD-10-CM | POA: Diagnosis not present

## 2022-07-02 DIAGNOSIS — I5032 Chronic diastolic (congestive) heart failure: Secondary | ICD-10-CM | POA: Diagnosis not present

## 2022-07-02 DIAGNOSIS — I110 Hypertensive heart disease with heart failure: Secondary | ICD-10-CM | POA: Diagnosis not present

## 2022-07-09 DIAGNOSIS — E785 Hyperlipidemia, unspecified: Secondary | ICD-10-CM | POA: Diagnosis not present

## 2022-07-09 DIAGNOSIS — E1122 Type 2 diabetes mellitus with diabetic chronic kidney disease: Secondary | ICD-10-CM | POA: Diagnosis not present

## 2022-07-09 DIAGNOSIS — T148XXD Other injury of unspecified body region, subsequent encounter: Secondary | ICD-10-CM | POA: Diagnosis not present

## 2022-07-09 DIAGNOSIS — I10 Essential (primary) hypertension: Secondary | ICD-10-CM | POA: Diagnosis not present

## 2022-07-09 DIAGNOSIS — N400 Enlarged prostate without lower urinary tract symptoms: Secondary | ICD-10-CM | POA: Diagnosis not present

## 2022-07-09 DIAGNOSIS — I63441 Cerebral infarction due to embolism of right cerebellar artery: Secondary | ICD-10-CM | POA: Diagnosis not present

## 2022-07-20 ENCOUNTER — Other Ambulatory Visit: Payer: Self-pay | Admitting: Internal Medicine

## 2022-07-20 DIAGNOSIS — I480 Paroxysmal atrial fibrillation: Secondary | ICD-10-CM

## 2022-07-23 ENCOUNTER — Encounter (HOSPITAL_COMMUNITY): Payer: Self-pay | Admitting: *Deleted

## 2022-07-23 DIAGNOSIS — M5136 Other intervertebral disc degeneration, lumbar region: Secondary | ICD-10-CM | POA: Diagnosis not present

## 2022-07-23 DIAGNOSIS — E1140 Type 2 diabetes mellitus with diabetic neuropathy, unspecified: Secondary | ICD-10-CM | POA: Diagnosis not present

## 2022-07-23 DIAGNOSIS — F0390 Unspecified dementia without behavioral disturbance: Secondary | ICD-10-CM | POA: Diagnosis not present

## 2022-07-23 DIAGNOSIS — Z7984 Long term (current) use of oral hypoglycemic drugs: Secondary | ICD-10-CM | POA: Diagnosis not present

## 2022-07-23 DIAGNOSIS — S81802D Unspecified open wound, left lower leg, subsequent encounter: Secondary | ICD-10-CM | POA: Diagnosis not present

## 2022-07-23 DIAGNOSIS — Z6826 Body mass index (BMI) 26.0-26.9, adult: Secondary | ICD-10-CM | POA: Diagnosis not present

## 2022-07-23 DIAGNOSIS — E1151 Type 2 diabetes mellitus with diabetic peripheral angiopathy without gangrene: Secondary | ICD-10-CM | POA: Diagnosis not present

## 2022-07-23 DIAGNOSIS — E559 Vitamin D deficiency, unspecified: Secondary | ICD-10-CM | POA: Diagnosis not present

## 2022-07-23 DIAGNOSIS — I4819 Other persistent atrial fibrillation: Secondary | ICD-10-CM | POA: Diagnosis not present

## 2022-07-23 DIAGNOSIS — I5032 Chronic diastolic (congestive) heart failure: Secondary | ICD-10-CM | POA: Diagnosis not present

## 2022-07-23 DIAGNOSIS — Z952 Presence of prosthetic heart valve: Secondary | ICD-10-CM | POA: Diagnosis not present

## 2022-07-23 DIAGNOSIS — I110 Hypertensive heart disease with heart failure: Secondary | ICD-10-CM | POA: Diagnosis not present

## 2022-07-23 DIAGNOSIS — I712 Thoracic aortic aneurysm, without rupture, unspecified: Secondary | ICD-10-CM | POA: Diagnosis not present

## 2022-07-23 DIAGNOSIS — Z9181 History of falling: Secondary | ICD-10-CM | POA: Diagnosis not present

## 2022-07-23 DIAGNOSIS — E785 Hyperlipidemia, unspecified: Secondary | ICD-10-CM | POA: Diagnosis not present

## 2022-07-23 DIAGNOSIS — Z7901 Long term (current) use of anticoagulants: Secondary | ICD-10-CM | POA: Diagnosis not present

## 2022-07-23 DIAGNOSIS — M5030 Other cervical disc degeneration, unspecified cervical region: Secondary | ICD-10-CM | POA: Diagnosis not present

## 2022-07-23 DIAGNOSIS — E539 Vitamin B deficiency, unspecified: Secondary | ICD-10-CM | POA: Diagnosis not present

## 2022-07-23 DIAGNOSIS — N400 Enlarged prostate without lower urinary tract symptoms: Secondary | ICD-10-CM | POA: Diagnosis not present

## 2022-07-23 DIAGNOSIS — Z8673 Personal history of transient ischemic attack (TIA), and cerebral infarction without residual deficits: Secondary | ICD-10-CM | POA: Diagnosis not present

## 2022-07-23 DIAGNOSIS — M1990 Unspecified osteoarthritis, unspecified site: Secondary | ICD-10-CM | POA: Diagnosis not present

## 2022-07-24 DIAGNOSIS — F0283 Dementia in other diseases classified elsewhere, unspecified severity, with mood disturbance: Secondary | ICD-10-CM | POA: Diagnosis not present

## 2022-07-24 DIAGNOSIS — G309 Alzheimer's disease, unspecified: Secondary | ICD-10-CM | POA: Diagnosis not present

## 2022-07-24 DIAGNOSIS — M1990 Unspecified osteoarthritis, unspecified site: Secondary | ICD-10-CM | POA: Diagnosis not present

## 2022-07-24 DIAGNOSIS — I480 Paroxysmal atrial fibrillation: Secondary | ICD-10-CM | POA: Diagnosis not present

## 2022-07-24 DIAGNOSIS — R54 Age-related physical debility: Secondary | ICD-10-CM | POA: Diagnosis not present

## 2022-07-24 DIAGNOSIS — L97821 Non-pressure chronic ulcer of other part of left lower leg limited to breakdown of skin: Secondary | ICD-10-CM | POA: Diagnosis not present

## 2022-07-24 DIAGNOSIS — I10 Essential (primary) hypertension: Secondary | ICD-10-CM | POA: Diagnosis not present

## 2022-07-24 DIAGNOSIS — I739 Peripheral vascular disease, unspecified: Secondary | ICD-10-CM | POA: Diagnosis not present

## 2022-07-29 ENCOUNTER — Other Ambulatory Visit: Payer: Self-pay | Admitting: Internal Medicine

## 2022-07-29 DIAGNOSIS — I480 Paroxysmal atrial fibrillation: Secondary | ICD-10-CM

## 2022-08-06 DIAGNOSIS — G4700 Insomnia, unspecified: Secondary | ICD-10-CM | POA: Diagnosis not present

## 2022-08-06 DIAGNOSIS — I5032 Chronic diastolic (congestive) heart failure: Secondary | ICD-10-CM | POA: Diagnosis not present

## 2022-08-06 DIAGNOSIS — E538 Deficiency of other specified B group vitamins: Secondary | ICD-10-CM | POA: Diagnosis not present

## 2022-08-06 DIAGNOSIS — L03116 Cellulitis of left lower limb: Secondary | ICD-10-CM | POA: Diagnosis not present

## 2022-08-11 DIAGNOSIS — G4730 Sleep apnea, unspecified: Secondary | ICD-10-CM | POA: Diagnosis not present

## 2022-08-11 DIAGNOSIS — I130 Hypertensive heart and chronic kidney disease with heart failure and stage 1 through stage 4 chronic kidney disease, or unspecified chronic kidney disease: Secondary | ICD-10-CM | POA: Diagnosis not present

## 2022-08-11 DIAGNOSIS — D539 Nutritional anemia, unspecified: Secondary | ICD-10-CM | POA: Diagnosis not present

## 2022-08-11 DIAGNOSIS — M1990 Unspecified osteoarthritis, unspecified site: Secondary | ICD-10-CM | POA: Diagnosis not present

## 2022-08-11 DIAGNOSIS — N529 Male erectile dysfunction, unspecified: Secondary | ICD-10-CM | POA: Diagnosis not present

## 2022-08-11 DIAGNOSIS — I5032 Chronic diastolic (congestive) heart failure: Secondary | ICD-10-CM | POA: Diagnosis not present

## 2022-08-11 DIAGNOSIS — M5030 Other cervical disc degeneration, unspecified cervical region: Secondary | ICD-10-CM | POA: Diagnosis not present

## 2022-08-11 DIAGNOSIS — L03116 Cellulitis of left lower limb: Secondary | ICD-10-CM | POA: Diagnosis not present

## 2022-08-11 DIAGNOSIS — Z87891 Personal history of nicotine dependence: Secondary | ICD-10-CM | POA: Diagnosis not present

## 2022-08-11 DIAGNOSIS — E1122 Type 2 diabetes mellitus with diabetic chronic kidney disease: Secondary | ICD-10-CM | POA: Diagnosis not present

## 2022-08-11 DIAGNOSIS — F0280 Dementia in other diseases classified elsewhere without behavioral disturbance: Secondary | ICD-10-CM | POA: Diagnosis not present

## 2022-08-11 DIAGNOSIS — N189 Chronic kidney disease, unspecified: Secondary | ICD-10-CM | POA: Diagnosis not present

## 2022-08-11 DIAGNOSIS — L97822 Non-pressure chronic ulcer of other part of left lower leg with fat layer exposed: Secondary | ICD-10-CM | POA: Diagnosis not present

## 2022-08-11 DIAGNOSIS — Z7901 Long term (current) use of anticoagulants: Secondary | ICD-10-CM | POA: Diagnosis not present

## 2022-08-11 DIAGNOSIS — I712 Thoracic aortic aneurysm, without rupture, unspecified: Secondary | ICD-10-CM | POA: Diagnosis not present

## 2022-08-11 DIAGNOSIS — G4700 Insomnia, unspecified: Secondary | ICD-10-CM | POA: Diagnosis not present

## 2022-08-11 DIAGNOSIS — Z952 Presence of prosthetic heart valve: Secondary | ICD-10-CM | POA: Diagnosis not present

## 2022-08-11 DIAGNOSIS — E538 Deficiency of other specified B group vitamins: Secondary | ICD-10-CM | POA: Diagnosis not present

## 2022-08-11 DIAGNOSIS — N400 Enlarged prostate without lower urinary tract symptoms: Secondary | ICD-10-CM | POA: Diagnosis not present

## 2022-08-11 DIAGNOSIS — I4819 Other persistent atrial fibrillation: Secondary | ICD-10-CM | POA: Diagnosis not present

## 2022-08-11 DIAGNOSIS — E785 Hyperlipidemia, unspecified: Secondary | ICD-10-CM | POA: Diagnosis not present

## 2022-08-11 DIAGNOSIS — E1151 Type 2 diabetes mellitus with diabetic peripheral angiopathy without gangrene: Secondary | ICD-10-CM | POA: Diagnosis not present

## 2022-08-11 DIAGNOSIS — M5136 Other intervertebral disc degeneration, lumbar region: Secondary | ICD-10-CM | POA: Diagnosis not present

## 2022-08-11 DIAGNOSIS — L97821 Non-pressure chronic ulcer of other part of left lower leg limited to breakdown of skin: Secondary | ICD-10-CM | POA: Diagnosis not present

## 2022-08-11 DIAGNOSIS — G309 Alzheimer's disease, unspecified: Secondary | ICD-10-CM | POA: Diagnosis not present

## 2022-08-13 DIAGNOSIS — E785 Hyperlipidemia, unspecified: Secondary | ICD-10-CM | POA: Diagnosis not present

## 2022-08-13 DIAGNOSIS — I5032 Chronic diastolic (congestive) heart failure: Secondary | ICD-10-CM | POA: Diagnosis not present

## 2022-08-13 DIAGNOSIS — I712 Thoracic aortic aneurysm, without rupture, unspecified: Secondary | ICD-10-CM | POA: Diagnosis not present

## 2022-08-13 DIAGNOSIS — R54 Age-related physical debility: Secondary | ICD-10-CM | POA: Diagnosis not present

## 2022-08-13 DIAGNOSIS — E1122 Type 2 diabetes mellitus with diabetic chronic kidney disease: Secondary | ICD-10-CM | POA: Diagnosis not present

## 2022-08-13 DIAGNOSIS — I4819 Other persistent atrial fibrillation: Secondary | ICD-10-CM | POA: Diagnosis not present

## 2022-08-13 DIAGNOSIS — I10 Essential (primary) hypertension: Secondary | ICD-10-CM | POA: Diagnosis not present

## 2022-08-13 DIAGNOSIS — G309 Alzheimer's disease, unspecified: Secondary | ICD-10-CM | POA: Diagnosis not present

## 2022-08-13 DIAGNOSIS — L97821 Non-pressure chronic ulcer of other part of left lower leg limited to breakdown of skin: Secondary | ICD-10-CM | POA: Diagnosis not present

## 2022-08-18 DIAGNOSIS — L97921 Non-pressure chronic ulcer of unspecified part of left lower leg limited to breakdown of skin: Secondary | ICD-10-CM | POA: Diagnosis not present

## 2022-08-21 DIAGNOSIS — N189 Chronic kidney disease, unspecified: Secondary | ICD-10-CM | POA: Diagnosis not present

## 2022-08-21 DIAGNOSIS — N400 Enlarged prostate without lower urinary tract symptoms: Secondary | ICD-10-CM | POA: Diagnosis not present

## 2022-08-21 DIAGNOSIS — I712 Thoracic aortic aneurysm, without rupture, unspecified: Secondary | ICD-10-CM | POA: Diagnosis not present

## 2022-08-21 DIAGNOSIS — D539 Nutritional anemia, unspecified: Secondary | ICD-10-CM | POA: Diagnosis not present

## 2022-08-21 DIAGNOSIS — G309 Alzheimer's disease, unspecified: Secondary | ICD-10-CM | POA: Diagnosis not present

## 2022-08-21 DIAGNOSIS — E1122 Type 2 diabetes mellitus with diabetic chronic kidney disease: Secondary | ICD-10-CM | POA: Diagnosis not present

## 2022-08-21 DIAGNOSIS — I5032 Chronic diastolic (congestive) heart failure: Secondary | ICD-10-CM | POA: Diagnosis not present

## 2022-08-21 DIAGNOSIS — M1990 Unspecified osteoarthritis, unspecified site: Secondary | ICD-10-CM | POA: Diagnosis not present

## 2022-08-21 DIAGNOSIS — L03116 Cellulitis of left lower limb: Secondary | ICD-10-CM | POA: Diagnosis not present

## 2022-08-21 DIAGNOSIS — E1151 Type 2 diabetes mellitus with diabetic peripheral angiopathy without gangrene: Secondary | ICD-10-CM | POA: Diagnosis not present

## 2022-08-21 DIAGNOSIS — I4819 Other persistent atrial fibrillation: Secondary | ICD-10-CM | POA: Diagnosis not present

## 2022-08-21 DIAGNOSIS — E538 Deficiency of other specified B group vitamins: Secondary | ICD-10-CM | POA: Diagnosis not present

## 2022-08-21 DIAGNOSIS — Z952 Presence of prosthetic heart valve: Secondary | ICD-10-CM | POA: Diagnosis not present

## 2022-08-21 DIAGNOSIS — M5136 Other intervertebral disc degeneration, lumbar region: Secondary | ICD-10-CM | POA: Diagnosis not present

## 2022-08-21 DIAGNOSIS — G4700 Insomnia, unspecified: Secondary | ICD-10-CM | POA: Diagnosis not present

## 2022-08-21 DIAGNOSIS — M5030 Other cervical disc degeneration, unspecified cervical region: Secondary | ICD-10-CM | POA: Diagnosis not present

## 2022-08-21 DIAGNOSIS — I130 Hypertensive heart and chronic kidney disease with heart failure and stage 1 through stage 4 chronic kidney disease, or unspecified chronic kidney disease: Secondary | ICD-10-CM | POA: Diagnosis not present

## 2022-08-21 DIAGNOSIS — F0280 Dementia in other diseases classified elsewhere without behavioral disturbance: Secondary | ICD-10-CM | POA: Diagnosis not present

## 2022-08-21 DIAGNOSIS — G4730 Sleep apnea, unspecified: Secondary | ICD-10-CM | POA: Diagnosis not present

## 2022-08-21 DIAGNOSIS — Z7901 Long term (current) use of anticoagulants: Secondary | ICD-10-CM | POA: Diagnosis not present

## 2022-08-21 DIAGNOSIS — E785 Hyperlipidemia, unspecified: Secondary | ICD-10-CM | POA: Diagnosis not present

## 2022-08-21 DIAGNOSIS — L97821 Non-pressure chronic ulcer of other part of left lower leg limited to breakdown of skin: Secondary | ICD-10-CM | POA: Diagnosis not present

## 2022-08-21 DIAGNOSIS — N529 Male erectile dysfunction, unspecified: Secondary | ICD-10-CM | POA: Diagnosis not present

## 2022-08-26 DIAGNOSIS — L97921 Non-pressure chronic ulcer of unspecified part of left lower leg limited to breakdown of skin: Secondary | ICD-10-CM | POA: Diagnosis not present

## 2022-08-26 DIAGNOSIS — L03116 Cellulitis of left lower limb: Secondary | ICD-10-CM | POA: Diagnosis not present

## 2022-08-26 DIAGNOSIS — I70245 Atherosclerosis of native arteries of left leg with ulceration of other part of foot: Secondary | ICD-10-CM | POA: Diagnosis not present

## 2022-08-26 DIAGNOSIS — L97821 Non-pressure chronic ulcer of other part of left lower leg limited to breakdown of skin: Secondary | ICD-10-CM | POA: Diagnosis not present

## 2022-08-31 DIAGNOSIS — E663 Overweight: Secondary | ICD-10-CM | POA: Diagnosis not present

## 2022-08-31 DIAGNOSIS — M79662 Pain in left lower leg: Secondary | ICD-10-CM | POA: Diagnosis not present

## 2022-08-31 DIAGNOSIS — I10 Essential (primary) hypertension: Secondary | ICD-10-CM | POA: Diagnosis not present

## 2022-08-31 DIAGNOSIS — G309 Alzheimer's disease, unspecified: Secondary | ICD-10-CM | POA: Diagnosis not present

## 2022-08-31 DIAGNOSIS — Z6826 Body mass index (BMI) 26.0-26.9, adult: Secondary | ICD-10-CM | POA: Diagnosis not present

## 2022-08-31 DIAGNOSIS — R001 Bradycardia, unspecified: Secondary | ICD-10-CM | POA: Diagnosis not present

## 2022-08-31 DIAGNOSIS — R54 Age-related physical debility: Secondary | ICD-10-CM | POA: Diagnosis not present

## 2022-09-02 DIAGNOSIS — G309 Alzheimer's disease, unspecified: Secondary | ICD-10-CM | POA: Diagnosis not present

## 2022-09-02 DIAGNOSIS — I130 Hypertensive heart and chronic kidney disease with heart failure and stage 1 through stage 4 chronic kidney disease, or unspecified chronic kidney disease: Secondary | ICD-10-CM | POA: Diagnosis not present

## 2022-09-02 DIAGNOSIS — L03116 Cellulitis of left lower limb: Secondary | ICD-10-CM | POA: Diagnosis not present

## 2022-09-02 DIAGNOSIS — M5136 Other intervertebral disc degeneration, lumbar region: Secondary | ICD-10-CM | POA: Diagnosis not present

## 2022-09-02 DIAGNOSIS — E1122 Type 2 diabetes mellitus with diabetic chronic kidney disease: Secondary | ICD-10-CM | POA: Diagnosis not present

## 2022-09-02 DIAGNOSIS — F0280 Dementia in other diseases classified elsewhere without behavioral disturbance: Secondary | ICD-10-CM | POA: Diagnosis not present

## 2022-09-02 DIAGNOSIS — L97821 Non-pressure chronic ulcer of other part of left lower leg limited to breakdown of skin: Secondary | ICD-10-CM | POA: Diagnosis not present

## 2022-09-02 DIAGNOSIS — I4819 Other persistent atrial fibrillation: Secondary | ICD-10-CM | POA: Diagnosis not present

## 2022-09-02 DIAGNOSIS — Z952 Presence of prosthetic heart valve: Secondary | ICD-10-CM | POA: Diagnosis not present

## 2022-09-02 DIAGNOSIS — D539 Nutritional anemia, unspecified: Secondary | ICD-10-CM | POA: Diagnosis not present

## 2022-09-02 DIAGNOSIS — M5030 Other cervical disc degeneration, unspecified cervical region: Secondary | ICD-10-CM | POA: Diagnosis not present

## 2022-09-02 DIAGNOSIS — N400 Enlarged prostate without lower urinary tract symptoms: Secondary | ICD-10-CM | POA: Diagnosis not present

## 2022-09-02 DIAGNOSIS — N529 Male erectile dysfunction, unspecified: Secondary | ICD-10-CM | POA: Diagnosis not present

## 2022-09-02 DIAGNOSIS — L97921 Non-pressure chronic ulcer of unspecified part of left lower leg limited to breakdown of skin: Secondary | ICD-10-CM | POA: Diagnosis not present

## 2022-09-02 DIAGNOSIS — E1151 Type 2 diabetes mellitus with diabetic peripheral angiopathy without gangrene: Secondary | ICD-10-CM | POA: Diagnosis not present

## 2022-09-02 DIAGNOSIS — M1990 Unspecified osteoarthritis, unspecified site: Secondary | ICD-10-CM | POA: Diagnosis not present

## 2022-09-02 DIAGNOSIS — N189 Chronic kidney disease, unspecified: Secondary | ICD-10-CM | POA: Diagnosis not present

## 2022-09-02 DIAGNOSIS — Z7901 Long term (current) use of anticoagulants: Secondary | ICD-10-CM | POA: Diagnosis not present

## 2022-09-02 DIAGNOSIS — E785 Hyperlipidemia, unspecified: Secondary | ICD-10-CM | POA: Diagnosis not present

## 2022-09-02 DIAGNOSIS — G4730 Sleep apnea, unspecified: Secondary | ICD-10-CM | POA: Diagnosis not present

## 2022-09-02 DIAGNOSIS — I712 Thoracic aortic aneurysm, without rupture, unspecified: Secondary | ICD-10-CM | POA: Diagnosis not present

## 2022-09-02 DIAGNOSIS — G4700 Insomnia, unspecified: Secondary | ICD-10-CM | POA: Diagnosis not present

## 2022-09-02 DIAGNOSIS — I5032 Chronic diastolic (congestive) heart failure: Secondary | ICD-10-CM | POA: Diagnosis not present

## 2022-09-02 DIAGNOSIS — E538 Deficiency of other specified B group vitamins: Secondary | ICD-10-CM | POA: Diagnosis not present

## 2022-09-03 DIAGNOSIS — G309 Alzheimer's disease, unspecified: Secondary | ICD-10-CM | POA: Diagnosis not present

## 2022-09-03 DIAGNOSIS — E1122 Type 2 diabetes mellitus with diabetic chronic kidney disease: Secondary | ICD-10-CM | POA: Diagnosis not present

## 2022-09-03 DIAGNOSIS — E785 Hyperlipidemia, unspecified: Secondary | ICD-10-CM | POA: Diagnosis not present

## 2022-09-03 DIAGNOSIS — Z8673 Personal history of transient ischemic attack (TIA), and cerebral infarction without residual deficits: Secondary | ICD-10-CM | POA: Diagnosis not present

## 2022-09-03 DIAGNOSIS — R54 Age-related physical debility: Secondary | ICD-10-CM | POA: Diagnosis not present

## 2022-09-03 DIAGNOSIS — I10 Essential (primary) hypertension: Secondary | ICD-10-CM | POA: Diagnosis not present

## 2022-09-03 DIAGNOSIS — F331 Major depressive disorder, recurrent, moderate: Secondary | ICD-10-CM | POA: Diagnosis not present

## 2022-09-14 DIAGNOSIS — N400 Enlarged prostate without lower urinary tract symptoms: Secondary | ICD-10-CM | POA: Diagnosis not present

## 2022-09-14 DIAGNOSIS — N189 Chronic kidney disease, unspecified: Secondary | ICD-10-CM | POA: Diagnosis not present

## 2022-09-14 DIAGNOSIS — E538 Deficiency of other specified B group vitamins: Secondary | ICD-10-CM | POA: Diagnosis not present

## 2022-09-14 DIAGNOSIS — I4819 Other persistent atrial fibrillation: Secondary | ICD-10-CM | POA: Diagnosis not present

## 2022-09-14 DIAGNOSIS — I130 Hypertensive heart and chronic kidney disease with heart failure and stage 1 through stage 4 chronic kidney disease, or unspecified chronic kidney disease: Secondary | ICD-10-CM | POA: Diagnosis not present

## 2022-09-14 DIAGNOSIS — M5030 Other cervical disc degeneration, unspecified cervical region: Secondary | ICD-10-CM | POA: Diagnosis not present

## 2022-09-14 DIAGNOSIS — L03116 Cellulitis of left lower limb: Secondary | ICD-10-CM | POA: Diagnosis not present

## 2022-09-14 DIAGNOSIS — M1990 Unspecified osteoarthritis, unspecified site: Secondary | ICD-10-CM | POA: Diagnosis not present

## 2022-09-14 DIAGNOSIS — Z952 Presence of prosthetic heart valve: Secondary | ICD-10-CM | POA: Diagnosis not present

## 2022-09-14 DIAGNOSIS — D539 Nutritional anemia, unspecified: Secondary | ICD-10-CM | POA: Diagnosis not present

## 2022-09-14 DIAGNOSIS — L97821 Non-pressure chronic ulcer of other part of left lower leg limited to breakdown of skin: Secondary | ICD-10-CM | POA: Diagnosis not present

## 2022-09-14 DIAGNOSIS — E1122 Type 2 diabetes mellitus with diabetic chronic kidney disease: Secondary | ICD-10-CM | POA: Diagnosis not present

## 2022-09-14 DIAGNOSIS — G4700 Insomnia, unspecified: Secondary | ICD-10-CM | POA: Diagnosis not present

## 2022-09-14 DIAGNOSIS — Z7901 Long term (current) use of anticoagulants: Secondary | ICD-10-CM | POA: Diagnosis not present

## 2022-09-14 DIAGNOSIS — F0280 Dementia in other diseases classified elsewhere without behavioral disturbance: Secondary | ICD-10-CM | POA: Diagnosis not present

## 2022-09-14 DIAGNOSIS — E1151 Type 2 diabetes mellitus with diabetic peripheral angiopathy without gangrene: Secondary | ICD-10-CM | POA: Diagnosis not present

## 2022-09-14 DIAGNOSIS — N529 Male erectile dysfunction, unspecified: Secondary | ICD-10-CM | POA: Diagnosis not present

## 2022-09-14 DIAGNOSIS — M5136 Other intervertebral disc degeneration, lumbar region: Secondary | ICD-10-CM | POA: Diagnosis not present

## 2022-09-14 DIAGNOSIS — G4730 Sleep apnea, unspecified: Secondary | ICD-10-CM | POA: Diagnosis not present

## 2022-09-14 DIAGNOSIS — I712 Thoracic aortic aneurysm, without rupture, unspecified: Secondary | ICD-10-CM | POA: Diagnosis not present

## 2022-09-14 DIAGNOSIS — I5032 Chronic diastolic (congestive) heart failure: Secondary | ICD-10-CM | POA: Diagnosis not present

## 2022-09-14 DIAGNOSIS — E785 Hyperlipidemia, unspecified: Secondary | ICD-10-CM | POA: Diagnosis not present

## 2022-09-14 DIAGNOSIS — G309 Alzheimer's disease, unspecified: Secondary | ICD-10-CM | POA: Diagnosis not present

## 2022-09-18 DIAGNOSIS — L97921 Non-pressure chronic ulcer of unspecified part of left lower leg limited to breakdown of skin: Secondary | ICD-10-CM | POA: Diagnosis not present

## 2022-09-18 DIAGNOSIS — L97821 Non-pressure chronic ulcer of other part of left lower leg limited to breakdown of skin: Secondary | ICD-10-CM | POA: Diagnosis not present

## 2022-09-28 DIAGNOSIS — F0280 Dementia in other diseases classified elsewhere without behavioral disturbance: Secondary | ICD-10-CM | POA: Diagnosis not present

## 2022-09-28 DIAGNOSIS — E1140 Type 2 diabetes mellitus with diabetic neuropathy, unspecified: Secondary | ICD-10-CM | POA: Diagnosis not present

## 2022-09-28 DIAGNOSIS — M1990 Unspecified osteoarthritis, unspecified site: Secondary | ICD-10-CM | POA: Diagnosis not present

## 2022-09-28 DIAGNOSIS — R54 Age-related physical debility: Secondary | ICD-10-CM | POA: Diagnosis not present

## 2022-09-28 DIAGNOSIS — G309 Alzheimer's disease, unspecified: Secondary | ICD-10-CM | POA: Diagnosis not present

## 2022-09-28 DIAGNOSIS — R001 Bradycardia, unspecified: Secondary | ICD-10-CM | POA: Diagnosis not present

## 2022-09-28 DIAGNOSIS — I10 Essential (primary) hypertension: Secondary | ICD-10-CM | POA: Diagnosis not present

## 2022-09-28 DIAGNOSIS — E663 Overweight: Secondary | ICD-10-CM | POA: Diagnosis not present

## 2022-10-02 DIAGNOSIS — L97821 Non-pressure chronic ulcer of other part of left lower leg limited to breakdown of skin: Secondary | ICD-10-CM | POA: Diagnosis not present

## 2022-10-02 DIAGNOSIS — L97921 Non-pressure chronic ulcer of unspecified part of left lower leg limited to breakdown of skin: Secondary | ICD-10-CM | POA: Diagnosis not present

## 2022-10-05 DIAGNOSIS — I5032 Chronic diastolic (congestive) heart failure: Secondary | ICD-10-CM | POA: Diagnosis not present

## 2022-10-05 DIAGNOSIS — R000 Tachycardia, unspecified: Secondary | ICD-10-CM | POA: Diagnosis not present

## 2022-10-05 DIAGNOSIS — R54 Age-related physical debility: Secondary | ICD-10-CM | POA: Diagnosis not present

## 2022-10-05 DIAGNOSIS — I110 Hypertensive heart disease with heart failure: Secondary | ICD-10-CM | POA: Diagnosis not present

## 2022-10-05 DIAGNOSIS — G309 Alzheimer's disease, unspecified: Secondary | ICD-10-CM | POA: Diagnosis not present

## 2022-10-07 DIAGNOSIS — E785 Hyperlipidemia, unspecified: Secondary | ICD-10-CM | POA: Diagnosis not present

## 2022-10-07 DIAGNOSIS — F331 Major depressive disorder, recurrent, moderate: Secondary | ICD-10-CM | POA: Diagnosis not present

## 2022-10-07 DIAGNOSIS — G309 Alzheimer's disease, unspecified: Secondary | ICD-10-CM | POA: Diagnosis not present

## 2022-10-07 DIAGNOSIS — F0280 Dementia in other diseases classified elsewhere without behavioral disturbance: Secondary | ICD-10-CM | POA: Diagnosis not present

## 2022-10-07 DIAGNOSIS — R54 Age-related physical debility: Secondary | ICD-10-CM | POA: Diagnosis not present

## 2022-10-07 DIAGNOSIS — I10 Essential (primary) hypertension: Secondary | ICD-10-CM | POA: Diagnosis not present

## 2022-10-08 DIAGNOSIS — E1122 Type 2 diabetes mellitus with diabetic chronic kidney disease: Secondary | ICD-10-CM | POA: Diagnosis not present

## 2022-10-08 DIAGNOSIS — E785 Hyperlipidemia, unspecified: Secondary | ICD-10-CM | POA: Diagnosis not present

## 2022-10-08 DIAGNOSIS — M5030 Other cervical disc degeneration, unspecified cervical region: Secondary | ICD-10-CM | POA: Diagnosis not present

## 2022-10-08 DIAGNOSIS — N400 Enlarged prostate without lower urinary tract symptoms: Secondary | ICD-10-CM | POA: Diagnosis not present

## 2022-10-08 DIAGNOSIS — L97821 Non-pressure chronic ulcer of other part of left lower leg limited to breakdown of skin: Secondary | ICD-10-CM | POA: Diagnosis not present

## 2022-10-08 DIAGNOSIS — Z952 Presence of prosthetic heart valve: Secondary | ICD-10-CM | POA: Diagnosis not present

## 2022-10-08 DIAGNOSIS — I130 Hypertensive heart and chronic kidney disease with heart failure and stage 1 through stage 4 chronic kidney disease, or unspecified chronic kidney disease: Secondary | ICD-10-CM | POA: Diagnosis not present

## 2022-10-08 DIAGNOSIS — F0280 Dementia in other diseases classified elsewhere without behavioral disturbance: Secondary | ICD-10-CM | POA: Diagnosis not present

## 2022-10-08 DIAGNOSIS — I712 Thoracic aortic aneurysm, without rupture, unspecified: Secondary | ICD-10-CM | POA: Diagnosis not present

## 2022-10-08 DIAGNOSIS — G4700 Insomnia, unspecified: Secondary | ICD-10-CM | POA: Diagnosis not present

## 2022-10-08 DIAGNOSIS — G309 Alzheimer's disease, unspecified: Secondary | ICD-10-CM | POA: Diagnosis not present

## 2022-10-08 DIAGNOSIS — I5032 Chronic diastolic (congestive) heart failure: Secondary | ICD-10-CM | POA: Diagnosis not present

## 2022-10-08 DIAGNOSIS — I4819 Other persistent atrial fibrillation: Secondary | ICD-10-CM | POA: Diagnosis not present

## 2022-10-08 DIAGNOSIS — G4730 Sleep apnea, unspecified: Secondary | ICD-10-CM | POA: Diagnosis not present

## 2022-10-08 DIAGNOSIS — M1990 Unspecified osteoarthritis, unspecified site: Secondary | ICD-10-CM | POA: Diagnosis not present

## 2022-10-08 DIAGNOSIS — M5136 Other intervertebral disc degeneration, lumbar region: Secondary | ICD-10-CM | POA: Diagnosis not present

## 2022-10-08 DIAGNOSIS — E538 Deficiency of other specified B group vitamins: Secondary | ICD-10-CM | POA: Diagnosis not present

## 2022-10-08 DIAGNOSIS — D539 Nutritional anemia, unspecified: Secondary | ICD-10-CM | POA: Diagnosis not present

## 2022-10-08 DIAGNOSIS — N529 Male erectile dysfunction, unspecified: Secondary | ICD-10-CM | POA: Diagnosis not present

## 2022-10-08 DIAGNOSIS — N189 Chronic kidney disease, unspecified: Secondary | ICD-10-CM | POA: Diagnosis not present

## 2022-10-08 DIAGNOSIS — E1151 Type 2 diabetes mellitus with diabetic peripheral angiopathy without gangrene: Secondary | ICD-10-CM | POA: Diagnosis not present

## 2022-10-08 DIAGNOSIS — Z7901 Long term (current) use of anticoagulants: Secondary | ICD-10-CM | POA: Diagnosis not present

## 2022-10-08 DIAGNOSIS — L03116 Cellulitis of left lower limb: Secondary | ICD-10-CM | POA: Diagnosis not present

## 2022-10-12 DIAGNOSIS — G4700 Insomnia, unspecified: Secondary | ICD-10-CM | POA: Diagnosis not present

## 2022-10-12 DIAGNOSIS — N189 Chronic kidney disease, unspecified: Secondary | ICD-10-CM | POA: Diagnosis not present

## 2022-10-12 DIAGNOSIS — Z7901 Long term (current) use of anticoagulants: Secondary | ICD-10-CM | POA: Diagnosis not present

## 2022-10-12 DIAGNOSIS — E1151 Type 2 diabetes mellitus with diabetic peripheral angiopathy without gangrene: Secondary | ICD-10-CM | POA: Diagnosis not present

## 2022-10-12 DIAGNOSIS — I5032 Chronic diastolic (congestive) heart failure: Secondary | ICD-10-CM | POA: Diagnosis not present

## 2022-10-12 DIAGNOSIS — M5030 Other cervical disc degeneration, unspecified cervical region: Secondary | ICD-10-CM | POA: Diagnosis not present

## 2022-10-12 DIAGNOSIS — I712 Thoracic aortic aneurysm, without rupture, unspecified: Secondary | ICD-10-CM | POA: Diagnosis not present

## 2022-10-12 DIAGNOSIS — E538 Deficiency of other specified B group vitamins: Secondary | ICD-10-CM | POA: Diagnosis not present

## 2022-10-12 DIAGNOSIS — N529 Male erectile dysfunction, unspecified: Secondary | ICD-10-CM | POA: Diagnosis not present

## 2022-10-12 DIAGNOSIS — M5136 Other intervertebral disc degeneration, lumbar region: Secondary | ICD-10-CM | POA: Diagnosis not present

## 2022-10-12 DIAGNOSIS — Z87891 Personal history of nicotine dependence: Secondary | ICD-10-CM | POA: Diagnosis not present

## 2022-10-12 DIAGNOSIS — F0280 Dementia in other diseases classified elsewhere without behavioral disturbance: Secondary | ICD-10-CM | POA: Diagnosis not present

## 2022-10-12 DIAGNOSIS — N400 Enlarged prostate without lower urinary tract symptoms: Secondary | ICD-10-CM | POA: Diagnosis not present

## 2022-10-12 DIAGNOSIS — I4819 Other persistent atrial fibrillation: Secondary | ICD-10-CM | POA: Diagnosis not present

## 2022-10-12 DIAGNOSIS — G4730 Sleep apnea, unspecified: Secondary | ICD-10-CM | POA: Diagnosis not present

## 2022-10-12 DIAGNOSIS — G309 Alzheimer's disease, unspecified: Secondary | ICD-10-CM | POA: Diagnosis not present

## 2022-10-12 DIAGNOSIS — E1122 Type 2 diabetes mellitus with diabetic chronic kidney disease: Secondary | ICD-10-CM | POA: Diagnosis not present

## 2022-10-12 DIAGNOSIS — I130 Hypertensive heart and chronic kidney disease with heart failure and stage 1 through stage 4 chronic kidney disease, or unspecified chronic kidney disease: Secondary | ICD-10-CM | POA: Diagnosis not present

## 2022-10-12 DIAGNOSIS — L97822 Non-pressure chronic ulcer of other part of left lower leg with fat layer exposed: Secondary | ICD-10-CM | POA: Diagnosis not present

## 2022-10-12 DIAGNOSIS — Z952 Presence of prosthetic heart valve: Secondary | ICD-10-CM | POA: Diagnosis not present

## 2022-10-12 DIAGNOSIS — E785 Hyperlipidemia, unspecified: Secondary | ICD-10-CM | POA: Diagnosis not present

## 2022-10-12 DIAGNOSIS — D539 Nutritional anemia, unspecified: Secondary | ICD-10-CM | POA: Diagnosis not present

## 2022-10-12 DIAGNOSIS — M1990 Unspecified osteoarthritis, unspecified site: Secondary | ICD-10-CM | POA: Diagnosis not present

## 2022-10-13 DIAGNOSIS — E785 Hyperlipidemia, unspecified: Secondary | ICD-10-CM | POA: Diagnosis not present

## 2022-10-13 DIAGNOSIS — I10 Essential (primary) hypertension: Secondary | ICD-10-CM | POA: Diagnosis not present

## 2022-10-13 DIAGNOSIS — G309 Alzheimer's disease, unspecified: Secondary | ICD-10-CM | POA: Diagnosis not present

## 2022-10-15 DIAGNOSIS — D539 Nutritional anemia, unspecified: Secondary | ICD-10-CM | POA: Diagnosis not present

## 2022-10-15 DIAGNOSIS — Z952 Presence of prosthetic heart valve: Secondary | ICD-10-CM | POA: Diagnosis not present

## 2022-10-15 DIAGNOSIS — F0280 Dementia in other diseases classified elsewhere without behavioral disturbance: Secondary | ICD-10-CM | POA: Diagnosis not present

## 2022-10-15 DIAGNOSIS — Z7901 Long term (current) use of anticoagulants: Secondary | ICD-10-CM | POA: Diagnosis not present

## 2022-10-15 DIAGNOSIS — L97822 Non-pressure chronic ulcer of other part of left lower leg with fat layer exposed: Secondary | ICD-10-CM | POA: Diagnosis not present

## 2022-10-15 DIAGNOSIS — I712 Thoracic aortic aneurysm, without rupture, unspecified: Secondary | ICD-10-CM | POA: Diagnosis not present

## 2022-10-15 DIAGNOSIS — N189 Chronic kidney disease, unspecified: Secondary | ICD-10-CM | POA: Diagnosis not present

## 2022-10-15 DIAGNOSIS — G4730 Sleep apnea, unspecified: Secondary | ICD-10-CM | POA: Diagnosis not present

## 2022-10-15 DIAGNOSIS — M1990 Unspecified osteoarthritis, unspecified site: Secondary | ICD-10-CM | POA: Diagnosis not present

## 2022-10-15 DIAGNOSIS — E538 Deficiency of other specified B group vitamins: Secondary | ICD-10-CM | POA: Diagnosis not present

## 2022-10-15 DIAGNOSIS — E1151 Type 2 diabetes mellitus with diabetic peripheral angiopathy without gangrene: Secondary | ICD-10-CM | POA: Diagnosis not present

## 2022-10-15 DIAGNOSIS — E1122 Type 2 diabetes mellitus with diabetic chronic kidney disease: Secondary | ICD-10-CM | POA: Diagnosis not present

## 2022-10-15 DIAGNOSIS — M5030 Other cervical disc degeneration, unspecified cervical region: Secondary | ICD-10-CM | POA: Diagnosis not present

## 2022-10-15 DIAGNOSIS — G4700 Insomnia, unspecified: Secondary | ICD-10-CM | POA: Diagnosis not present

## 2022-10-15 DIAGNOSIS — I4819 Other persistent atrial fibrillation: Secondary | ICD-10-CM | POA: Diagnosis not present

## 2022-10-15 DIAGNOSIS — N400 Enlarged prostate without lower urinary tract symptoms: Secondary | ICD-10-CM | POA: Diagnosis not present

## 2022-10-15 DIAGNOSIS — Z87891 Personal history of nicotine dependence: Secondary | ICD-10-CM | POA: Diagnosis not present

## 2022-10-15 DIAGNOSIS — E785 Hyperlipidemia, unspecified: Secondary | ICD-10-CM | POA: Diagnosis not present

## 2022-10-15 DIAGNOSIS — M5136 Other intervertebral disc degeneration, lumbar region: Secondary | ICD-10-CM | POA: Diagnosis not present

## 2022-10-15 DIAGNOSIS — I130 Hypertensive heart and chronic kidney disease with heart failure and stage 1 through stage 4 chronic kidney disease, or unspecified chronic kidney disease: Secondary | ICD-10-CM | POA: Diagnosis not present

## 2022-10-15 DIAGNOSIS — N529 Male erectile dysfunction, unspecified: Secondary | ICD-10-CM | POA: Diagnosis not present

## 2022-10-15 DIAGNOSIS — I5032 Chronic diastolic (congestive) heart failure: Secondary | ICD-10-CM | POA: Diagnosis not present

## 2022-10-15 DIAGNOSIS — G309 Alzheimer's disease, unspecified: Secondary | ICD-10-CM | POA: Diagnosis not present

## 2022-10-16 DIAGNOSIS — I70245 Atherosclerosis of native arteries of left leg with ulceration of other part of foot: Secondary | ICD-10-CM | POA: Diagnosis not present

## 2022-10-16 DIAGNOSIS — I739 Peripheral vascular disease, unspecified: Secondary | ICD-10-CM | POA: Diagnosis not present

## 2022-10-16 DIAGNOSIS — L97821 Non-pressure chronic ulcer of other part of left lower leg limited to breakdown of skin: Secondary | ICD-10-CM | POA: Diagnosis not present

## 2022-10-16 DIAGNOSIS — L03116 Cellulitis of left lower limb: Secondary | ICD-10-CM | POA: Diagnosis not present

## 2022-10-30 DIAGNOSIS — L97821 Non-pressure chronic ulcer of other part of left lower leg limited to breakdown of skin: Secondary | ICD-10-CM | POA: Diagnosis not present

## 2022-10-30 DIAGNOSIS — L97929 Non-pressure chronic ulcer of unspecified part of left lower leg with unspecified severity: Secondary | ICD-10-CM | POA: Diagnosis not present

## 2022-10-30 DIAGNOSIS — C44719 Basal cell carcinoma of skin of left lower limb, including hip: Secondary | ICD-10-CM | POA: Diagnosis not present

## 2022-10-30 DIAGNOSIS — I70245 Atherosclerosis of native arteries of left leg with ulceration of other part of foot: Secondary | ICD-10-CM | POA: Diagnosis not present

## 2022-10-30 DIAGNOSIS — L03116 Cellulitis of left lower limb: Secondary | ICD-10-CM | POA: Diagnosis not present

## 2022-10-30 DIAGNOSIS — I739 Peripheral vascular disease, unspecified: Secondary | ICD-10-CM | POA: Diagnosis not present

## 2022-11-04 DIAGNOSIS — L97509 Non-pressure chronic ulcer of other part of unspecified foot with unspecified severity: Secondary | ICD-10-CM | POA: Diagnosis not present

## 2022-11-11 DIAGNOSIS — N400 Enlarged prostate without lower urinary tract symptoms: Secondary | ICD-10-CM | POA: Diagnosis not present

## 2022-11-11 DIAGNOSIS — I110 Hypertensive heart disease with heart failure: Secondary | ICD-10-CM | POA: Diagnosis not present

## 2022-11-11 DIAGNOSIS — L97921 Non-pressure chronic ulcer of unspecified part of left lower leg limited to breakdown of skin: Secondary | ICD-10-CM | POA: Diagnosis not present

## 2022-11-11 DIAGNOSIS — I5032 Chronic diastolic (congestive) heart failure: Secondary | ICD-10-CM | POA: Diagnosis not present

## 2022-11-11 DIAGNOSIS — E1140 Type 2 diabetes mellitus with diabetic neuropathy, unspecified: Secondary | ICD-10-CM | POA: Diagnosis not present

## 2022-11-13 DIAGNOSIS — I10 Essential (primary) hypertension: Secondary | ICD-10-CM | POA: Diagnosis not present

## 2022-11-13 DIAGNOSIS — E785 Hyperlipidemia, unspecified: Secondary | ICD-10-CM | POA: Diagnosis not present

## 2022-11-13 DIAGNOSIS — E119 Type 2 diabetes mellitus without complications: Secondary | ICD-10-CM | POA: Diagnosis not present

## 2022-11-13 DIAGNOSIS — R54 Age-related physical debility: Secondary | ICD-10-CM | POA: Diagnosis not present

## 2022-11-13 DIAGNOSIS — F0280 Dementia in other diseases classified elsewhere without behavioral disturbance: Secondary | ICD-10-CM | POA: Diagnosis not present

## 2022-11-13 DIAGNOSIS — G309 Alzheimer's disease, unspecified: Secondary | ICD-10-CM | POA: Diagnosis not present

## 2022-11-13 DIAGNOSIS — F331 Major depressive disorder, recurrent, moderate: Secondary | ICD-10-CM | POA: Diagnosis not present

## 2022-11-15 ENCOUNTER — Other Ambulatory Visit: Payer: Self-pay | Admitting: Internal Medicine

## 2022-11-24 ENCOUNTER — Other Ambulatory Visit: Payer: Self-pay | Admitting: Internal Medicine

## 2022-12-04 DIAGNOSIS — E118 Type 2 diabetes mellitus with unspecified complications: Secondary | ICD-10-CM | POA: Diagnosis not present

## 2022-12-04 DIAGNOSIS — C44719 Basal cell carcinoma of skin of left lower limb, including hip: Secondary | ICD-10-CM | POA: Diagnosis not present

## 2022-12-04 DIAGNOSIS — I739 Peripheral vascular disease, unspecified: Secondary | ICD-10-CM | POA: Diagnosis not present

## 2022-12-04 DIAGNOSIS — C44711 Basal cell carcinoma of skin of unspecified lower limb, including hip: Secondary | ICD-10-CM | POA: Diagnosis not present

## 2022-12-08 DIAGNOSIS — C44711 Basal cell carcinoma of skin of unspecified lower limb, including hip: Secondary | ICD-10-CM | POA: Diagnosis not present

## 2022-12-09 DIAGNOSIS — I959 Hypotension, unspecified: Secondary | ICD-10-CM | POA: Diagnosis not present

## 2022-12-09 DIAGNOSIS — F331 Major depressive disorder, recurrent, moderate: Secondary | ICD-10-CM | POA: Diagnosis not present

## 2022-12-09 DIAGNOSIS — Z136 Encounter for screening for cardiovascular disorders: Secondary | ICD-10-CM | POA: Diagnosis not present

## 2022-12-09 DIAGNOSIS — I480 Paroxysmal atrial fibrillation: Secondary | ICD-10-CM | POA: Diagnosis not present

## 2022-12-09 DIAGNOSIS — E785 Hyperlipidemia, unspecified: Secondary | ICD-10-CM | POA: Diagnosis not present

## 2022-12-09 DIAGNOSIS — I10 Essential (primary) hypertension: Secondary | ICD-10-CM | POA: Diagnosis not present

## 2022-12-09 DIAGNOSIS — I712 Thoracic aortic aneurysm, without rupture, unspecified: Secondary | ICD-10-CM | POA: Diagnosis not present

## 2022-12-09 DIAGNOSIS — R7309 Other abnormal glucose: Secondary | ICD-10-CM | POA: Diagnosis not present

## 2022-12-09 DIAGNOSIS — I5032 Chronic diastolic (congestive) heart failure: Secondary | ICD-10-CM | POA: Diagnosis not present

## 2022-12-09 DIAGNOSIS — G309 Alzheimer's disease, unspecified: Secondary | ICD-10-CM | POA: Diagnosis not present

## 2022-12-09 DIAGNOSIS — N400 Enlarged prostate without lower urinary tract symptoms: Secondary | ICD-10-CM | POA: Diagnosis not present

## 2022-12-09 DIAGNOSIS — E1140 Type 2 diabetes mellitus with diabetic neuropathy, unspecified: Secondary | ICD-10-CM | POA: Diagnosis not present

## 2022-12-09 DIAGNOSIS — E119 Type 2 diabetes mellitus without complications: Secondary | ICD-10-CM | POA: Diagnosis not present

## 2022-12-09 DIAGNOSIS — Z95828 Presence of other vascular implants and grafts: Secondary | ICD-10-CM | POA: Diagnosis not present

## 2022-12-09 DIAGNOSIS — F0280 Dementia in other diseases classified elsewhere without behavioral disturbance: Secondary | ICD-10-CM | POA: Diagnosis not present

## 2022-12-09 DIAGNOSIS — R54 Age-related physical debility: Secondary | ICD-10-CM | POA: Diagnosis not present

## 2022-12-09 DIAGNOSIS — F1290 Cannabis use, unspecified, uncomplicated: Secondary | ICD-10-CM | POA: Diagnosis not present

## 2022-12-10 DIAGNOSIS — R001 Bradycardia, unspecified: Secondary | ICD-10-CM | POA: Diagnosis not present

## 2022-12-11 DIAGNOSIS — L97821 Non-pressure chronic ulcer of other part of left lower leg limited to breakdown of skin: Secondary | ICD-10-CM | POA: Diagnosis not present

## 2022-12-11 DIAGNOSIS — L97921 Non-pressure chronic ulcer of unspecified part of left lower leg limited to breakdown of skin: Secondary | ICD-10-CM | POA: Diagnosis not present

## 2022-12-15 DIAGNOSIS — Z01818 Encounter for other preprocedural examination: Secondary | ICD-10-CM | POA: Diagnosis not present

## 2022-12-15 DIAGNOSIS — E785 Hyperlipidemia, unspecified: Secondary | ICD-10-CM | POA: Diagnosis not present

## 2022-12-15 DIAGNOSIS — C44719 Basal cell carcinoma of skin of left lower limb, including hip: Secondary | ICD-10-CM | POA: Diagnosis not present

## 2022-12-15 DIAGNOSIS — E039 Hypothyroidism, unspecified: Secondary | ICD-10-CM | POA: Diagnosis not present

## 2022-12-15 DIAGNOSIS — I5030 Unspecified diastolic (congestive) heart failure: Secondary | ICD-10-CM | POA: Diagnosis not present

## 2022-12-15 DIAGNOSIS — L989 Disorder of the skin and subcutaneous tissue, unspecified: Secondary | ICD-10-CM | POA: Diagnosis not present

## 2022-12-15 DIAGNOSIS — E119 Type 2 diabetes mellitus without complications: Secondary | ICD-10-CM | POA: Diagnosis not present

## 2022-12-15 DIAGNOSIS — Z79899 Other long term (current) drug therapy: Secondary | ICD-10-CM | POA: Diagnosis not present

## 2022-12-15 DIAGNOSIS — I4819 Other persistent atrial fibrillation: Secondary | ICD-10-CM | POA: Diagnosis not present

## 2022-12-15 DIAGNOSIS — I712 Thoracic aortic aneurysm, without rupture, unspecified: Secondary | ICD-10-CM | POA: Diagnosis not present

## 2022-12-15 DIAGNOSIS — Z7901 Long term (current) use of anticoagulants: Secondary | ICD-10-CM | POA: Diagnosis not present

## 2022-12-15 DIAGNOSIS — Z539 Procedure and treatment not carried out, unspecified reason: Secondary | ICD-10-CM | POA: Diagnosis not present

## 2022-12-21 DIAGNOSIS — C44729 Squamous cell carcinoma of skin of left lower limb, including hip: Secondary | ICD-10-CM | POA: Diagnosis not present

## 2022-12-21 DIAGNOSIS — S81802A Unspecified open wound, left lower leg, initial encounter: Secondary | ICD-10-CM | POA: Diagnosis not present

## 2022-12-21 DIAGNOSIS — C44719 Basal cell carcinoma of skin of left lower limb, including hip: Secondary | ICD-10-CM | POA: Diagnosis not present

## 2022-12-24 DIAGNOSIS — Z483 Aftercare following surgery for neoplasm: Secondary | ICD-10-CM | POA: Diagnosis not present

## 2022-12-24 DIAGNOSIS — Z79899 Other long term (current) drug therapy: Secondary | ICD-10-CM | POA: Diagnosis not present

## 2022-12-24 DIAGNOSIS — M21862 Other specified acquired deformities of left lower leg: Secondary | ICD-10-CM | POA: Diagnosis not present

## 2022-12-24 DIAGNOSIS — C4481 Basal cell carcinoma of overlapping sites of skin: Secondary | ICD-10-CM | POA: Diagnosis not present

## 2022-12-24 DIAGNOSIS — E119 Type 2 diabetes mellitus without complications: Secondary | ICD-10-CM | POA: Diagnosis not present

## 2022-12-30 DIAGNOSIS — Z09 Encounter for follow-up examination after completed treatment for conditions other than malignant neoplasm: Secondary | ICD-10-CM | POA: Diagnosis not present

## 2023-01-05 DIAGNOSIS — Z09 Encounter for follow-up examination after completed treatment for conditions other than malignant neoplasm: Secondary | ICD-10-CM | POA: Diagnosis not present

## 2023-01-06 DIAGNOSIS — E785 Hyperlipidemia, unspecified: Secondary | ICD-10-CM | POA: Diagnosis not present

## 2023-01-06 DIAGNOSIS — F0280 Dementia in other diseases classified elsewhere without behavioral disturbance: Secondary | ICD-10-CM | POA: Diagnosis not present

## 2023-01-06 DIAGNOSIS — I480 Paroxysmal atrial fibrillation: Secondary | ICD-10-CM | POA: Diagnosis not present

## 2023-01-06 DIAGNOSIS — I10 Essential (primary) hypertension: Secondary | ICD-10-CM | POA: Diagnosis not present

## 2023-01-06 DIAGNOSIS — Z945 Skin transplant status: Secondary | ICD-10-CM | POA: Diagnosis not present

## 2023-01-06 DIAGNOSIS — E1140 Type 2 diabetes mellitus with diabetic neuropathy, unspecified: Secondary | ICD-10-CM | POA: Diagnosis not present

## 2023-01-06 DIAGNOSIS — G309 Alzheimer's disease, unspecified: Secondary | ICD-10-CM | POA: Diagnosis not present

## 2023-01-06 DIAGNOSIS — Z85828 Personal history of other malignant neoplasm of skin: Secondary | ICD-10-CM | POA: Diagnosis not present

## 2023-01-07 DIAGNOSIS — L97921 Non-pressure chronic ulcer of unspecified part of left lower leg limited to breakdown of skin: Secondary | ICD-10-CM | POA: Diagnosis not present

## 2023-01-07 DIAGNOSIS — L97821 Non-pressure chronic ulcer of other part of left lower leg limited to breakdown of skin: Secondary | ICD-10-CM | POA: Diagnosis not present

## 2023-01-11 ENCOUNTER — Telehealth: Payer: Self-pay | Admitting: Radiology

## 2023-01-11 NOTE — Telephone Encounter (Signed)
Wrong number, was not able to leave voicemail for Pt to call back to schedule AWV visit.

## 2023-01-13 DIAGNOSIS — E785 Hyperlipidemia, unspecified: Secondary | ICD-10-CM | POA: Diagnosis not present

## 2023-01-13 DIAGNOSIS — R54 Age-related physical debility: Secondary | ICD-10-CM | POA: Diagnosis not present

## 2023-01-13 DIAGNOSIS — F331 Major depressive disorder, recurrent, moderate: Secondary | ICD-10-CM | POA: Diagnosis not present

## 2023-01-13 DIAGNOSIS — G309 Alzheimer's disease, unspecified: Secondary | ICD-10-CM | POA: Diagnosis not present

## 2023-01-13 DIAGNOSIS — E1122 Type 2 diabetes mellitus with diabetic chronic kidney disease: Secondary | ICD-10-CM | POA: Diagnosis not present

## 2023-01-13 DIAGNOSIS — I10 Essential (primary) hypertension: Secondary | ICD-10-CM | POA: Diagnosis not present

## 2023-01-13 DIAGNOSIS — F0280 Dementia in other diseases classified elsewhere without behavioral disturbance: Secondary | ICD-10-CM | POA: Diagnosis not present

## 2023-01-18 DIAGNOSIS — D485 Neoplasm of uncertain behavior of skin: Secondary | ICD-10-CM | POA: Diagnosis not present

## 2023-01-18 DIAGNOSIS — D235 Other benign neoplasm of skin of trunk: Secondary | ICD-10-CM | POA: Diagnosis not present

## 2023-01-18 DIAGNOSIS — C4441 Basal cell carcinoma of skin of scalp and neck: Secondary | ICD-10-CM | POA: Diagnosis not present

## 2023-01-18 DIAGNOSIS — L821 Other seborrheic keratosis: Secondary | ICD-10-CM | POA: Diagnosis not present

## 2023-01-18 DIAGNOSIS — L570 Actinic keratosis: Secondary | ICD-10-CM | POA: Diagnosis not present

## 2023-01-18 DIAGNOSIS — D1801 Hemangioma of skin and subcutaneous tissue: Secondary | ICD-10-CM | POA: Diagnosis not present

## 2023-01-18 DIAGNOSIS — Z85828 Personal history of other malignant neoplasm of skin: Secondary | ICD-10-CM | POA: Diagnosis not present

## 2023-01-18 DIAGNOSIS — L820 Inflamed seborrheic keratosis: Secondary | ICD-10-CM | POA: Diagnosis not present

## 2023-01-18 DIAGNOSIS — Z129 Encounter for screening for malignant neoplasm, site unspecified: Secondary | ICD-10-CM | POA: Diagnosis not present

## 2023-01-20 ENCOUNTER — Telehealth: Payer: Self-pay

## 2023-01-20 NOTE — Telephone Encounter (Signed)
Per Caregiver, pt has a new PCP that comes in and does home visits. Pt is no longer seeing Dr. Yetta Barre.

## 2023-01-29 DIAGNOSIS — C44719 Basal cell carcinoma of skin of left lower limb, including hip: Secondary | ICD-10-CM | POA: Diagnosis not present

## 2023-02-05 DIAGNOSIS — F0280 Dementia in other diseases classified elsewhere without behavioral disturbance: Secondary | ICD-10-CM | POA: Diagnosis not present

## 2023-02-05 DIAGNOSIS — Z6825 Body mass index (BMI) 25.0-25.9, adult: Secondary | ICD-10-CM | POA: Diagnosis not present

## 2023-02-05 DIAGNOSIS — I480 Paroxysmal atrial fibrillation: Secondary | ICD-10-CM | POA: Diagnosis not present

## 2023-02-05 DIAGNOSIS — I10 Essential (primary) hypertension: Secondary | ICD-10-CM | POA: Diagnosis not present

## 2023-02-05 DIAGNOSIS — Z945 Skin transplant status: Secondary | ICD-10-CM | POA: Diagnosis not present

## 2023-02-05 DIAGNOSIS — Z85828 Personal history of other malignant neoplasm of skin: Secondary | ICD-10-CM | POA: Diagnosis not present

## 2023-02-05 DIAGNOSIS — E1140 Type 2 diabetes mellitus with diabetic neuropathy, unspecified: Secondary | ICD-10-CM | POA: Diagnosis not present

## 2023-02-05 DIAGNOSIS — E785 Hyperlipidemia, unspecified: Secondary | ICD-10-CM | POA: Diagnosis not present

## 2023-02-05 DIAGNOSIS — G309 Alzheimer's disease, unspecified: Secondary | ICD-10-CM | POA: Diagnosis not present

## 2023-02-09 DIAGNOSIS — E785 Hyperlipidemia, unspecified: Secondary | ICD-10-CM | POA: Diagnosis not present

## 2023-02-09 DIAGNOSIS — I10 Essential (primary) hypertension: Secondary | ICD-10-CM | POA: Diagnosis not present

## 2023-02-09 DIAGNOSIS — F0280 Dementia in other diseases classified elsewhere without behavioral disturbance: Secondary | ICD-10-CM | POA: Diagnosis not present

## 2023-02-09 DIAGNOSIS — F331 Major depressive disorder, recurrent, moderate: Secondary | ICD-10-CM | POA: Diagnosis not present

## 2023-02-09 DIAGNOSIS — E119 Type 2 diabetes mellitus without complications: Secondary | ICD-10-CM | POA: Diagnosis not present

## 2023-02-09 DIAGNOSIS — R54 Age-related physical debility: Secondary | ICD-10-CM | POA: Diagnosis not present

## 2023-02-09 DIAGNOSIS — G309 Alzheimer's disease, unspecified: Secondary | ICD-10-CM | POA: Diagnosis not present

## 2023-03-01 DIAGNOSIS — I5030 Unspecified diastolic (congestive) heart failure: Secondary | ICD-10-CM | POA: Diagnosis not present

## 2023-03-01 DIAGNOSIS — I517 Cardiomegaly: Secondary | ICD-10-CM | POA: Diagnosis not present

## 2023-03-15 DIAGNOSIS — G309 Alzheimer's disease, unspecified: Secondary | ICD-10-CM | POA: Diagnosis not present

## 2023-03-15 DIAGNOSIS — E785 Hyperlipidemia, unspecified: Secondary | ICD-10-CM | POA: Diagnosis not present

## 2023-03-15 DIAGNOSIS — I10 Essential (primary) hypertension: Secondary | ICD-10-CM | POA: Diagnosis not present

## 2023-03-15 DIAGNOSIS — F0280 Dementia in other diseases classified elsewhere without behavioral disturbance: Secondary | ICD-10-CM | POA: Diagnosis not present

## 2023-03-15 DIAGNOSIS — R54 Age-related physical debility: Secondary | ICD-10-CM | POA: Diagnosis not present

## 2023-03-15 DIAGNOSIS — F331 Major depressive disorder, recurrent, moderate: Secondary | ICD-10-CM | POA: Diagnosis not present

## 2023-03-15 DIAGNOSIS — E1149 Type 2 diabetes mellitus with other diabetic neurological complication: Secondary | ICD-10-CM | POA: Diagnosis not present

## 2023-03-17 DIAGNOSIS — C4441 Basal cell carcinoma of skin of scalp and neck: Secondary | ICD-10-CM | POA: Diagnosis not present

## 2023-03-21 ENCOUNTER — Other Ambulatory Visit: Payer: Self-pay | Admitting: Internal Medicine

## 2023-03-21 DIAGNOSIS — I480 Paroxysmal atrial fibrillation: Secondary | ICD-10-CM

## 2023-03-29 DIAGNOSIS — F0280 Dementia in other diseases classified elsewhere without behavioral disturbance: Secondary | ICD-10-CM | POA: Diagnosis not present

## 2023-03-29 DIAGNOSIS — N400 Enlarged prostate without lower urinary tract symptoms: Secondary | ICD-10-CM | POA: Diagnosis not present

## 2023-03-29 DIAGNOSIS — R54 Age-related physical debility: Secondary | ICD-10-CM | POA: Diagnosis not present

## 2023-03-29 DIAGNOSIS — I10 Essential (primary) hypertension: Secondary | ICD-10-CM | POA: Diagnosis not present

## 2023-03-29 DIAGNOSIS — E1149 Type 2 diabetes mellitus with other diabetic neurological complication: Secondary | ICD-10-CM | POA: Diagnosis not present

## 2023-03-29 DIAGNOSIS — G309 Alzheimer's disease, unspecified: Secondary | ICD-10-CM | POA: Diagnosis not present

## 2023-03-29 DIAGNOSIS — E785 Hyperlipidemia, unspecified: Secondary | ICD-10-CM | POA: Diagnosis not present

## 2023-03-29 DIAGNOSIS — F331 Major depressive disorder, recurrent, moderate: Secondary | ICD-10-CM | POA: Diagnosis not present

## 2023-03-30 ENCOUNTER — Telehealth: Payer: Self-pay | Admitting: Internal Medicine

## 2023-03-30 NOTE — Telephone Encounter (Signed)
Contacted Clinton Black to schedule their annual wellness visit. Patient declined to schedule AWV at this time. Transferred care   [facility] Guide [facility] AWV TEAM Direct Dial: [phone]

## 2023-03-31 DIAGNOSIS — Z945 Skin transplant status: Secondary | ICD-10-CM | POA: Diagnosis not present

## 2023-03-31 DIAGNOSIS — Z85828 Personal history of other malignant neoplasm of skin: Secondary | ICD-10-CM | POA: Diagnosis not present

## 2023-03-31 DIAGNOSIS — E785 Hyperlipidemia, unspecified: Secondary | ICD-10-CM | POA: Diagnosis not present

## 2023-03-31 DIAGNOSIS — I480 Paroxysmal atrial fibrillation: Secondary | ICD-10-CM | POA: Diagnosis not present

## 2023-03-31 DIAGNOSIS — Z7901 Long term (current) use of anticoagulants: Secondary | ICD-10-CM | POA: Diagnosis not present

## 2023-03-31 DIAGNOSIS — I10 Essential (primary) hypertension: Secondary | ICD-10-CM | POA: Diagnosis not present

## 2023-03-31 DIAGNOSIS — G309 Alzheimer's disease, unspecified: Secondary | ICD-10-CM | POA: Diagnosis not present

## 2023-03-31 DIAGNOSIS — Z0001 Encounter for general adult medical examination with abnormal findings: Secondary | ICD-10-CM | POA: Diagnosis not present

## 2023-03-31 DIAGNOSIS — F0280 Dementia in other diseases classified elsewhere without behavioral disturbance: Secondary | ICD-10-CM | POA: Diagnosis not present

## 2023-03-31 DIAGNOSIS — E1140 Type 2 diabetes mellitus with diabetic neuropathy, unspecified: Secondary | ICD-10-CM | POA: Diagnosis not present

## 2023-04-29 DIAGNOSIS — I4819 Other persistent atrial fibrillation: Secondary | ICD-10-CM | POA: Diagnosis not present

## 2023-04-29 DIAGNOSIS — G309 Alzheimer's disease, unspecified: Secondary | ICD-10-CM | POA: Diagnosis not present

## 2023-04-29 DIAGNOSIS — E1140 Type 2 diabetes mellitus with diabetic neuropathy, unspecified: Secondary | ICD-10-CM | POA: Diagnosis not present

## 2023-04-30 DIAGNOSIS — G309 Alzheimer's disease, unspecified: Secondary | ICD-10-CM | POA: Diagnosis not present

## 2023-04-30 DIAGNOSIS — F0280 Dementia in other diseases classified elsewhere without behavioral disturbance: Secondary | ICD-10-CM | POA: Diagnosis not present

## 2023-04-30 DIAGNOSIS — E119 Type 2 diabetes mellitus without complications: Secondary | ICD-10-CM | POA: Diagnosis not present

## 2023-04-30 DIAGNOSIS — I10 Essential (primary) hypertension: Secondary | ICD-10-CM | POA: Diagnosis not present

## 2023-04-30 DIAGNOSIS — E785 Hyperlipidemia, unspecified: Secondary | ICD-10-CM | POA: Diagnosis not present

## 2023-04-30 DIAGNOSIS — R54 Age-related physical debility: Secondary | ICD-10-CM | POA: Diagnosis not present

## 2023-04-30 DIAGNOSIS — F331 Major depressive disorder, recurrent, moderate: Secondary | ICD-10-CM | POA: Diagnosis not present

## 2023-05-06 DIAGNOSIS — Z95828 Presence of other vascular implants and grafts: Secondary | ICD-10-CM | POA: Diagnosis not present

## 2023-05-26 DIAGNOSIS — R42 Dizziness and giddiness: Secondary | ICD-10-CM | POA: Diagnosis not present

## 2023-05-26 DIAGNOSIS — I5032 Chronic diastolic (congestive) heart failure: Secondary | ICD-10-CM | POA: Diagnosis not present

## 2023-05-26 DIAGNOSIS — I10 Essential (primary) hypertension: Secondary | ICD-10-CM | POA: Diagnosis not present

## 2023-05-26 DIAGNOSIS — E782 Mixed hyperlipidemia: Secondary | ICD-10-CM | POA: Diagnosis not present

## 2023-05-26 DIAGNOSIS — R7303 Prediabetes: Secondary | ICD-10-CM | POA: Diagnosis not present

## 2023-05-26 DIAGNOSIS — F0280 Dementia in other diseases classified elsewhere without behavioral disturbance: Secondary | ICD-10-CM | POA: Diagnosis not present

## 2023-05-26 DIAGNOSIS — E538 Deficiency of other specified B group vitamins: Secondary | ICD-10-CM | POA: Diagnosis not present

## 2023-05-26 DIAGNOSIS — N400 Enlarged prostate without lower urinary tract symptoms: Secondary | ICD-10-CM | POA: Diagnosis not present

## 2023-05-26 DIAGNOSIS — I110 Hypertensive heart disease with heart failure: Secondary | ICD-10-CM | POA: Diagnosis not present

## 2023-06-10 DIAGNOSIS — R54 Age-related physical debility: Secondary | ICD-10-CM | POA: Diagnosis not present

## 2023-06-10 DIAGNOSIS — F0280 Dementia in other diseases classified elsewhere without behavioral disturbance: Secondary | ICD-10-CM | POA: Diagnosis not present

## 2023-06-10 DIAGNOSIS — E119 Type 2 diabetes mellitus without complications: Secondary | ICD-10-CM | POA: Diagnosis not present

## 2023-06-10 DIAGNOSIS — G309 Alzheimer's disease, unspecified: Secondary | ICD-10-CM | POA: Diagnosis not present

## 2023-06-10 DIAGNOSIS — N400 Enlarged prostate without lower urinary tract symptoms: Secondary | ICD-10-CM | POA: Diagnosis not present

## 2023-06-10 DIAGNOSIS — E782 Mixed hyperlipidemia: Secondary | ICD-10-CM | POA: Diagnosis not present

## 2023-06-10 DIAGNOSIS — E785 Hyperlipidemia, unspecified: Secondary | ICD-10-CM | POA: Diagnosis not present

## 2023-06-10 DIAGNOSIS — F331 Major depressive disorder, recurrent, moderate: Secondary | ICD-10-CM | POA: Diagnosis not present

## 2023-06-10 DIAGNOSIS — I10 Essential (primary) hypertension: Secondary | ICD-10-CM | POA: Diagnosis not present

## 2023-06-21 DIAGNOSIS — D1801 Hemangioma of skin and subcutaneous tissue: Secondary | ICD-10-CM | POA: Diagnosis not present

## 2023-06-21 DIAGNOSIS — Z129 Encounter for screening for malignant neoplasm, site unspecified: Secondary | ICD-10-CM | POA: Diagnosis not present

## 2023-06-21 DIAGNOSIS — L570 Actinic keratosis: Secondary | ICD-10-CM | POA: Diagnosis not present

## 2023-06-21 DIAGNOSIS — L821 Other seborrheic keratosis: Secondary | ICD-10-CM | POA: Diagnosis not present

## 2023-06-21 DIAGNOSIS — Z85828 Personal history of other malignant neoplasm of skin: Secondary | ICD-10-CM | POA: Diagnosis not present

## 2023-06-24 DIAGNOSIS — G309 Alzheimer's disease, unspecified: Secondary | ICD-10-CM | POA: Diagnosis not present

## 2023-06-24 DIAGNOSIS — I10 Essential (primary) hypertension: Secondary | ICD-10-CM | POA: Diagnosis not present

## 2023-06-24 DIAGNOSIS — N400 Enlarged prostate without lower urinary tract symptoms: Secondary | ICD-10-CM | POA: Diagnosis not present

## 2023-06-24 DIAGNOSIS — F0280 Dementia in other diseases classified elsewhere without behavioral disturbance: Secondary | ICD-10-CM | POA: Diagnosis not present

## 2023-06-24 DIAGNOSIS — E782 Mixed hyperlipidemia: Secondary | ICD-10-CM | POA: Diagnosis not present

## 2023-06-24 DIAGNOSIS — E1140 Type 2 diabetes mellitus with diabetic neuropathy, unspecified: Secondary | ICD-10-CM | POA: Diagnosis not present

## 2023-06-29 DIAGNOSIS — I700 Atherosclerosis of aorta: Secondary | ICD-10-CM | POA: Diagnosis not present

## 2023-06-29 DIAGNOSIS — I2510 Atherosclerotic heart disease of native coronary artery without angina pectoris: Secondary | ICD-10-CM | POA: Diagnosis not present

## 2023-06-29 DIAGNOSIS — Z952 Presence of prosthetic heart valve: Secondary | ICD-10-CM | POA: Diagnosis not present

## 2023-06-29 DIAGNOSIS — I712 Thoracic aortic aneurysm, without rupture, unspecified: Secondary | ICD-10-CM | POA: Diagnosis not present

## 2023-06-29 DIAGNOSIS — I7121 Aneurysm of the ascending aorta, without rupture: Secondary | ICD-10-CM | POA: Diagnosis not present

## 2023-06-29 DIAGNOSIS — Z95828 Presence of other vascular implants and grafts: Secondary | ICD-10-CM | POA: Diagnosis not present

## 2023-07-13 DIAGNOSIS — E782 Mixed hyperlipidemia: Secondary | ICD-10-CM | POA: Diagnosis not present

## 2023-07-13 DIAGNOSIS — I10 Essential (primary) hypertension: Secondary | ICD-10-CM | POA: Diagnosis not present

## 2023-07-13 DIAGNOSIS — F0280 Dementia in other diseases classified elsewhere without behavioral disturbance: Secondary | ICD-10-CM | POA: Diagnosis not present

## 2023-07-13 DIAGNOSIS — E785 Hyperlipidemia, unspecified: Secondary | ICD-10-CM | POA: Diagnosis not present

## 2023-07-13 DIAGNOSIS — E119 Type 2 diabetes mellitus without complications: Secondary | ICD-10-CM | POA: Diagnosis not present

## 2023-07-13 DIAGNOSIS — G309 Alzheimer's disease, unspecified: Secondary | ICD-10-CM | POA: Diagnosis not present

## 2023-07-13 DIAGNOSIS — F331 Major depressive disorder, recurrent, moderate: Secondary | ICD-10-CM | POA: Diagnosis not present

## 2023-07-13 DIAGNOSIS — R54 Age-related physical debility: Secondary | ICD-10-CM | POA: Diagnosis not present

## 2023-08-06 DIAGNOSIS — F331 Major depressive disorder, recurrent, moderate: Secondary | ICD-10-CM | POA: Diagnosis not present

## 2023-08-06 DIAGNOSIS — R54 Age-related physical debility: Secondary | ICD-10-CM | POA: Diagnosis not present

## 2023-08-06 DIAGNOSIS — F0280 Dementia in other diseases classified elsewhere without behavioral disturbance: Secondary | ICD-10-CM | POA: Diagnosis not present

## 2023-08-06 DIAGNOSIS — N400 Enlarged prostate without lower urinary tract symptoms: Secondary | ICD-10-CM | POA: Diagnosis not present

## 2023-08-06 DIAGNOSIS — E782 Mixed hyperlipidemia: Secondary | ICD-10-CM | POA: Diagnosis not present

## 2023-08-06 DIAGNOSIS — I10 Essential (primary) hypertension: Secondary | ICD-10-CM | POA: Diagnosis not present

## 2023-08-06 DIAGNOSIS — G309 Alzheimer's disease, unspecified: Secondary | ICD-10-CM | POA: Diagnosis not present

## 2023-08-06 DIAGNOSIS — E1149 Type 2 diabetes mellitus with other diabetic neurological complication: Secondary | ICD-10-CM | POA: Diagnosis not present

## 2023-08-19 DIAGNOSIS — N400 Enlarged prostate without lower urinary tract symptoms: Secondary | ICD-10-CM | POA: Diagnosis not present

## 2023-08-19 DIAGNOSIS — F0390 Unspecified dementia without behavioral disturbance: Secondary | ICD-10-CM | POA: Diagnosis not present

## 2023-08-19 DIAGNOSIS — F331 Major depressive disorder, recurrent, moderate: Secondary | ICD-10-CM | POA: Diagnosis not present

## 2023-08-19 DIAGNOSIS — E1140 Type 2 diabetes mellitus with diabetic neuropathy, unspecified: Secondary | ICD-10-CM | POA: Diagnosis not present

## 2023-08-19 DIAGNOSIS — I480 Paroxysmal atrial fibrillation: Secondary | ICD-10-CM | POA: Diagnosis not present

## 2023-08-19 DIAGNOSIS — E785 Hyperlipidemia, unspecified: Secondary | ICD-10-CM | POA: Diagnosis not present

## 2023-09-02 DIAGNOSIS — G309 Alzheimer's disease, unspecified: Secondary | ICD-10-CM | POA: Diagnosis not present

## 2023-09-02 DIAGNOSIS — I480 Paroxysmal atrial fibrillation: Secondary | ICD-10-CM | POA: Diagnosis not present

## 2023-09-02 DIAGNOSIS — F0280 Dementia in other diseases classified elsewhere without behavioral disturbance: Secondary | ICD-10-CM | POA: Diagnosis not present

## 2023-09-06 DIAGNOSIS — E1149 Type 2 diabetes mellitus with other diabetic neurological complication: Secondary | ICD-10-CM | POA: Diagnosis not present

## 2023-09-06 DIAGNOSIS — F331 Major depressive disorder, recurrent, moderate: Secondary | ICD-10-CM | POA: Diagnosis not present

## 2023-09-06 DIAGNOSIS — R54 Age-related physical debility: Secondary | ICD-10-CM | POA: Diagnosis not present

## 2023-09-06 DIAGNOSIS — G309 Alzheimer's disease, unspecified: Secondary | ICD-10-CM | POA: Diagnosis not present

## 2023-09-06 DIAGNOSIS — I10 Essential (primary) hypertension: Secondary | ICD-10-CM | POA: Diagnosis not present

## 2023-09-06 DIAGNOSIS — F0280 Dementia in other diseases classified elsewhere without behavioral disturbance: Secondary | ICD-10-CM | POA: Diagnosis not present

## 2023-09-06 DIAGNOSIS — E782 Mixed hyperlipidemia: Secondary | ICD-10-CM | POA: Diagnosis not present

## 2023-09-23 DIAGNOSIS — N400 Enlarged prostate without lower urinary tract symptoms: Secondary | ICD-10-CM | POA: Diagnosis not present

## 2023-09-23 DIAGNOSIS — R972 Elevated prostate specific antigen [PSA]: Secondary | ICD-10-CM | POA: Diagnosis not present

## 2023-09-30 DIAGNOSIS — E785 Hyperlipidemia, unspecified: Secondary | ICD-10-CM | POA: Diagnosis not present

## 2023-09-30 DIAGNOSIS — E782 Mixed hyperlipidemia: Secondary | ICD-10-CM | POA: Diagnosis not present

## 2023-09-30 DIAGNOSIS — I10 Essential (primary) hypertension: Secondary | ICD-10-CM | POA: Diagnosis not present

## 2023-09-30 DIAGNOSIS — F0280 Dementia in other diseases classified elsewhere without behavioral disturbance: Secondary | ICD-10-CM | POA: Diagnosis not present

## 2023-09-30 DIAGNOSIS — R54 Age-related physical debility: Secondary | ICD-10-CM | POA: Diagnosis not present

## 2023-09-30 DIAGNOSIS — G309 Alzheimer's disease, unspecified: Secondary | ICD-10-CM | POA: Diagnosis not present

## 2023-09-30 DIAGNOSIS — F331 Major depressive disorder, recurrent, moderate: Secondary | ICD-10-CM | POA: Diagnosis not present

## 2023-09-30 DIAGNOSIS — E119 Type 2 diabetes mellitus without complications: Secondary | ICD-10-CM | POA: Diagnosis not present
